# Patient Record
Sex: Female | Born: 1965
Health system: Southern US, Community
[De-identification: ages and names within clinical notes are randomized; demographics above are authoritative.]

## PROBLEM LIST (undated history)

## (undated) DIAGNOSIS — M1990 Unspecified osteoarthritis, unspecified site: Secondary | ICD-10-CM

## (undated) DIAGNOSIS — E7800 Pure hypercholesterolemia, unspecified: Secondary | ICD-10-CM

## (undated) DIAGNOSIS — I213 ST elevation (STEMI) myocardial infarction of unspecified site: Secondary | ICD-10-CM

## (undated) DIAGNOSIS — R51 Headache: Secondary | ICD-10-CM

## (undated) DIAGNOSIS — M25561 Pain in right knee: Secondary | ICD-10-CM

## (undated) DIAGNOSIS — R569 Unspecified convulsions: Secondary | ICD-10-CM

## (undated) DIAGNOSIS — Z9289 Personal history of other medical treatment: Secondary | ICD-10-CM

## (undated) DIAGNOSIS — D219 Benign neoplasm of connective and other soft tissue, unspecified: Secondary | ICD-10-CM

## (undated) DIAGNOSIS — E559 Vitamin D deficiency, unspecified: Secondary | ICD-10-CM

## (undated) DIAGNOSIS — I10 Essential (primary) hypertension: Secondary | ICD-10-CM

## (undated) DIAGNOSIS — E785 Hyperlipidemia, unspecified: Secondary | ICD-10-CM

## (undated) HISTORY — DX: Benign neoplasm of connective and other soft tissue, unspecified: D21.9

## (undated) HISTORY — DX: Pure hypercholesterolemia, unspecified: E78.00

## (undated) HISTORY — DX: Personal history of other medical treatment: Z92.89

## (undated) HISTORY — DX: Vitamin D deficiency, unspecified: E55.9

## (undated) HISTORY — PX: TUBAL LIGATION: SHX77

## (undated) HISTORY — DX: Pain in right knee: M25.561

## (undated) HISTORY — PX: UMBILICAL HERNIA REPAIR: SHX196

## (undated) HISTORY — DX: Unspecified osteoarthritis, unspecified site: M19.90

---

## 2004-05-26 ENCOUNTER — Emergency Department (HOSPITAL_COMMUNITY): Admission: EM | Admit: 2004-05-26 | Discharge: 2004-05-27 | Payer: Self-pay | Admitting: Emergency Medicine

## 2004-11-08 ENCOUNTER — Emergency Department (HOSPITAL_COMMUNITY): Admission: AD | Admit: 2004-11-08 | Discharge: 2004-11-08 | Payer: Self-pay | Admitting: Family Medicine

## 2005-01-09 ENCOUNTER — Emergency Department (HOSPITAL_COMMUNITY): Admission: EM | Admit: 2005-01-09 | Discharge: 2005-01-09 | Payer: Self-pay | Admitting: Family Medicine

## 2005-01-21 ENCOUNTER — Ambulatory Visit: Payer: Self-pay | Admitting: Internal Medicine

## 2005-01-30 ENCOUNTER — Ambulatory Visit: Payer: Self-pay | Admitting: Internal Medicine

## 2005-02-07 ENCOUNTER — Emergency Department (HOSPITAL_COMMUNITY): Admission: EM | Admit: 2005-02-07 | Discharge: 2005-02-07 | Payer: Self-pay | Admitting: Emergency Medicine

## 2005-04-09 ENCOUNTER — Emergency Department (HOSPITAL_COMMUNITY): Admission: EM | Admit: 2005-04-09 | Discharge: 2005-04-09 | Payer: Self-pay | Admitting: Cardiology

## 2005-05-11 ENCOUNTER — Ambulatory Visit: Payer: Self-pay | Admitting: Internal Medicine

## 2005-06-17 ENCOUNTER — Emergency Department (HOSPITAL_COMMUNITY): Admission: EM | Admit: 2005-06-17 | Discharge: 2005-06-17 | Payer: Self-pay | Admitting: Emergency Medicine

## 2005-09-29 ENCOUNTER — Emergency Department (HOSPITAL_COMMUNITY): Admission: EM | Admit: 2005-09-29 | Discharge: 2005-09-29 | Payer: Self-pay | Admitting: Emergency Medicine

## 2005-11-24 ENCOUNTER — Ambulatory Visit: Payer: Self-pay | Admitting: Internal Medicine

## 2006-02-12 ENCOUNTER — Emergency Department (HOSPITAL_COMMUNITY): Admission: EM | Admit: 2006-02-12 | Discharge: 2006-02-12 | Payer: Self-pay | Admitting: Family Medicine

## 2006-04-12 ENCOUNTER — Emergency Department (HOSPITAL_COMMUNITY): Admission: EM | Admit: 2006-04-12 | Discharge: 2006-04-12 | Payer: Self-pay | Admitting: Emergency Medicine

## 2006-10-20 ENCOUNTER — Emergency Department (HOSPITAL_COMMUNITY): Admission: EM | Admit: 2006-10-20 | Discharge: 2006-10-20 | Payer: Self-pay | Admitting: Emergency Medicine

## 2007-03-15 ENCOUNTER — Emergency Department (HOSPITAL_COMMUNITY): Admission: EM | Admit: 2007-03-15 | Discharge: 2007-03-15 | Payer: Self-pay | Admitting: Emergency Medicine

## 2007-08-16 ENCOUNTER — Emergency Department (HOSPITAL_COMMUNITY): Admission: EM | Admit: 2007-08-16 | Discharge: 2007-08-16 | Payer: Self-pay | Admitting: Emergency Medicine

## 2007-12-21 ENCOUNTER — Emergency Department (HOSPITAL_COMMUNITY): Admission: EM | Admit: 2007-12-21 | Discharge: 2007-12-21 | Payer: Self-pay | Admitting: Emergency Medicine

## 2009-09-03 ENCOUNTER — Other Ambulatory Visit: Admission: RE | Admit: 2009-09-03 | Discharge: 2009-09-03 | Payer: Self-pay | Admitting: Family Medicine

## 2009-09-28 HISTORY — PX: SHOULDER SURGERY: SHX246

## 2009-10-14 ENCOUNTER — Ambulatory Visit (HOSPITAL_COMMUNITY): Admission: RE | Admit: 2009-10-14 | Discharge: 2009-10-14 | Payer: Self-pay | Admitting: Chiropractic Medicine

## 2009-12-29 ENCOUNTER — Emergency Department (HOSPITAL_COMMUNITY): Admission: EM | Admit: 2009-12-29 | Discharge: 2009-12-29 | Payer: Self-pay | Admitting: Emergency Medicine

## 2010-11-06 ENCOUNTER — Emergency Department (HOSPITAL_COMMUNITY): Payer: Managed Care, Other (non HMO)

## 2010-11-06 ENCOUNTER — Emergency Department (HOSPITAL_COMMUNITY)
Admission: EM | Admit: 2010-11-06 | Discharge: 2010-11-06 | Disposition: A | Discharge status: Home / Self Care | Payer: Managed Care, Other (non HMO) | Attending: Emergency Medicine | Admitting: Emergency Medicine

## 2010-11-06 DIAGNOSIS — R000 Tachycardia, unspecified: Secondary | ICD-10-CM | POA: Insufficient documentation

## 2010-11-06 DIAGNOSIS — M25519 Pain in unspecified shoulder: Secondary | ICD-10-CM | POA: Insufficient documentation

## 2010-11-06 DIAGNOSIS — I10 Essential (primary) hypertension: Secondary | ICD-10-CM | POA: Insufficient documentation

## 2010-11-06 DIAGNOSIS — R0602 Shortness of breath: Secondary | ICD-10-CM | POA: Insufficient documentation

## 2010-11-06 DIAGNOSIS — E119 Type 2 diabetes mellitus without complications: Secondary | ICD-10-CM | POA: Insufficient documentation

## 2010-11-06 DIAGNOSIS — R079 Chest pain, unspecified: Secondary | ICD-10-CM | POA: Insufficient documentation

## 2010-11-06 LAB — URINALYSIS, ROUTINE W REFLEX MICROSCOPIC
Ketones, ur: NEGATIVE mg/dL
Protein, ur: NEGATIVE mg/dL
Urine Glucose, Fasting: NEGATIVE mg/dL
Urobilinogen, UA: 1 mg/dL (ref 0.0–1.0)

## 2010-11-06 LAB — DIFFERENTIAL
Basophils Absolute: 0 10*3/uL (ref 0.0–0.1)
Basophils Relative: 0 % (ref 0–1)
Eosinophils Relative: 3 % (ref 0–5)
Monocytes Absolute: 0.6 10*3/uL (ref 0.1–1.0)
Neutro Abs: 2.9 10*3/uL (ref 1.7–7.7)

## 2010-11-06 LAB — CK TOTAL AND CKMB (NOT AT ARMC)
CK, MB: 0.9 ng/mL (ref 0.3–4.0)
Relative Index: 0.7 (ref 0.0–2.5)
Total CK: 125 U/L (ref 7–177)

## 2010-11-06 LAB — CBC
HCT: 33.8 % — ABNORMAL LOW (ref 36.0–46.0)
Hemoglobin: 10.6 g/dL — ABNORMAL LOW (ref 12.0–15.0)
MCV: 65.3 fL — ABNORMAL LOW (ref 78.0–100.0)
RDW: 18.6 % — ABNORMAL HIGH (ref 11.5–15.5)
WBC: 6.3 10*3/uL (ref 4.0–10.5)

## 2010-11-06 LAB — BASIC METABOLIC PANEL
BUN: 8 mg/dL (ref 6–23)
CO2: 29 mEq/L (ref 19–32)
Chloride: 98 mEq/L (ref 96–112)
Glucose, Bld: 165 mg/dL — ABNORMAL HIGH (ref 70–99)
Potassium: 3.2 mEq/L — ABNORMAL LOW (ref 3.5–5.1)

## 2010-11-06 LAB — TROPONIN I: Troponin I: 0.01 ng/mL (ref 0.00–0.06)

## 2010-11-06 LAB — D-DIMER, QUANTITATIVE: D-Dimer, Quant: 0.55 ug/mL-FEU — ABNORMAL HIGH (ref 0.00–0.48)

## 2010-11-06 LAB — GLUCOSE, CAPILLARY: Glucose-Capillary: 160 mg/dL — ABNORMAL HIGH (ref 70–99)

## 2010-11-06 MED ORDER — IOHEXOL 300 MG/ML  SOLN
100.0000 mL | Freq: Once | INTRAMUSCULAR | Status: AC | PRN
  Administered 2010-11-06: 100 mL via INTRAVENOUS

## 2010-12-17 ENCOUNTER — Emergency Department (HOSPITAL_COMMUNITY)
Admission: EM | Admit: 2010-12-17 | Discharge: 2010-12-17 | Disposition: A | Discharge status: Home / Self Care | Payer: Managed Care, Other (non HMO) | Attending: Emergency Medicine | Admitting: Emergency Medicine

## 2010-12-17 DIAGNOSIS — E119 Type 2 diabetes mellitus without complications: Secondary | ICD-10-CM | POA: Insufficient documentation

## 2010-12-17 DIAGNOSIS — L089 Local infection of the skin and subcutaneous tissue, unspecified: Secondary | ICD-10-CM | POA: Insufficient documentation

## 2010-12-17 DIAGNOSIS — R21 Rash and other nonspecific skin eruption: Secondary | ICD-10-CM | POA: Insufficient documentation

## 2010-12-17 DIAGNOSIS — I10 Essential (primary) hypertension: Secondary | ICD-10-CM | POA: Insufficient documentation

## 2010-12-17 LAB — GLUCOSE, CAPILLARY: Glucose-Capillary: 246 mg/dL — ABNORMAL HIGH (ref 70–99)

## 2011-01-07 ENCOUNTER — Emergency Department (HOSPITAL_COMMUNITY)
Admission: EM | Admit: 2011-01-07 | Discharge: 2011-01-07 | Disposition: A | Discharge status: Home / Self Care | Payer: Managed Care, Other (non HMO) | Attending: Emergency Medicine | Admitting: Emergency Medicine

## 2011-01-07 DIAGNOSIS — E119 Type 2 diabetes mellitus without complications: Secondary | ICD-10-CM | POA: Insufficient documentation

## 2011-01-07 DIAGNOSIS — X58XXXA Exposure to other specified factors, initial encounter: Secondary | ICD-10-CM | POA: Insufficient documentation

## 2011-01-07 DIAGNOSIS — H5710 Ocular pain, unspecified eye: Secondary | ICD-10-CM | POA: Insufficient documentation

## 2011-01-07 DIAGNOSIS — I10 Essential (primary) hypertension: Secondary | ICD-10-CM | POA: Insufficient documentation

## 2011-01-07 DIAGNOSIS — R51 Headache: Secondary | ICD-10-CM | POA: Insufficient documentation

## 2011-01-07 DIAGNOSIS — S058X9A Other injuries of unspecified eye and orbit, initial encounter: Secondary | ICD-10-CM | POA: Insufficient documentation

## 2011-01-07 DIAGNOSIS — F411 Generalized anxiety disorder: Secondary | ICD-10-CM | POA: Insufficient documentation

## 2011-06-22 LAB — HEPATIC FUNCTION PANEL
ALT: 14
AST: 21
Albumin: 3.6
Alkaline Phosphatase: 42
Total Protein: 8

## 2011-06-22 LAB — BASIC METABOLIC PANEL
BUN: 7
CO2: 25
GFR calc non Af Amer: >60
Glucose, Bld: 179 — ABNORMAL HIGH
Potassium: 3.6

## 2011-06-22 LAB — CBC
MCHC: 32
Platelets: 483 — ABNORMAL HIGH
RBC: 4.84
WBC: 7.5

## 2011-06-22 LAB — DIFFERENTIAL
Eosinophils Relative: 2
Monocytes Absolute: 0.5
Neutrophils Relative %: 52

## 2011-06-22 LAB — POCT CARDIAC MARKERS: CKMB, poc: 1.2

## 2011-10-18 ENCOUNTER — Emergency Department (HOSPITAL_COMMUNITY)
Admission: EM | Admit: 2011-10-18 | Discharge: 2011-10-18 | Disposition: A | Discharge status: Home / Self Care | Payer: Managed Care, Other (non HMO) | Attending: Emergency Medicine | Admitting: Emergency Medicine

## 2011-10-18 ENCOUNTER — Encounter (HOSPITAL_COMMUNITY): Payer: Self-pay | Admitting: Emergency Medicine

## 2011-10-18 ENCOUNTER — Emergency Department (HOSPITAL_COMMUNITY): Payer: Managed Care, Other (non HMO)

## 2011-10-18 DIAGNOSIS — H538 Other visual disturbances: Secondary | ICD-10-CM | POA: Insufficient documentation

## 2011-10-18 DIAGNOSIS — R51 Headache: Secondary | ICD-10-CM | POA: Insufficient documentation

## 2011-10-18 DIAGNOSIS — I10 Essential (primary) hypertension: Secondary | ICD-10-CM | POA: Insufficient documentation

## 2011-10-18 DIAGNOSIS — Z79899 Other long term (current) drug therapy: Secondary | ICD-10-CM | POA: Insufficient documentation

## 2011-10-18 DIAGNOSIS — E119 Type 2 diabetes mellitus without complications: Secondary | ICD-10-CM | POA: Insufficient documentation

## 2011-10-18 DIAGNOSIS — E785 Hyperlipidemia, unspecified: Secondary | ICD-10-CM | POA: Insufficient documentation

## 2011-10-18 HISTORY — DX: Essential (primary) hypertension: I10

## 2011-10-18 HISTORY — DX: Hyperlipidemia, unspecified: E78.5

## 2011-10-18 LAB — BASIC METABOLIC PANEL
CO2: 27 mEq/L (ref 19–32)
Chloride: 98 mEq/L (ref 96–112)
GFR calc non Af Amer: >90 mL/min (ref >90)
Glucose, Bld: 149 mg/dL — ABNORMAL HIGH (ref 70–99)
Potassium: 3.4 mEq/L — ABNORMAL LOW (ref 3.5–5.1)
Sodium: 136 mEq/L (ref 135–145)

## 2011-10-18 LAB — CBC
HCT: 31.3 % — ABNORMAL LOW (ref 36.0–46.0)
Hemoglobin: 9.8 g/dL — ABNORMAL LOW (ref 12.0–15.0)
MCH: 20.2 pg — ABNORMAL LOW (ref 26.0–34.0)
RBC: 4.85 MIL/uL (ref 3.87–5.11)

## 2011-10-18 MED ORDER — GLIPIZIDE 10 MG PO TABS: 10.0000 mg | ORAL_TABLET | Freq: Every day | ORAL | Status: DC

## 2011-10-18 MED ORDER — METFORMIN HCL 1000 MG PO TABS: 1000.0000 mg | ORAL_TABLET | Freq: Two times a day (BID) | ORAL | Status: DC

## 2011-10-18 NOTE — ED Notes (Signed)
States that she has been having blurred vision and headaches for the past month. States that she saw the eye doctor and was asked to follow up due to something that was seen in her eye exam. States that she is out of her Metformin and Glipizide and last took it yesterday.

## 2011-10-18 NOTE — ED Provider Notes (Addendum)
History     CSN: [id number]  Arrival date & time 10/18/11  1337   First MD Initiated Contact with Patient 10/18/11 [phone]      Chief Complaint  Patient presents with  . Hyperglycemia  . Headache  . Blurred Vision    (Consider location/radiation/quality/duration/timing/severity/associated sxs/prior treatment) Patient is a 46 y.o. female presenting with headaches. The history is provided by the patient.  Headache  This is a recurrent problem. The current episode started more than 1 week ago. The problem has not changed since onset.The headache is associated with nothing. Pain location: Top. The pain is at a severity of 5/10. The pain is moderate. The pain does not radiate. Pertinent negatives include no fever, no chest pressure, no near-syncope, no syncope, no shortness of breath, no nausea and no vomiting. She has tried nothing for the symptoms.   Any symptoms for several weeks associated with visual symptoms and been going on for more than a month. Patient has a history of diabetes the visual complaints have been evaluated by her eye doctor and she has a specialty referral to [facility] for further evaluation. She is concerned about her blood sugar being high was measured to 10 at church when the symptoms started. History of diabetes. Past Medical History  Diagnosis Date  . Diabetes mellitus   . Hypertension   . Hyperlipidemia     Past Surgical History  Procedure Date  . Tubal ligation   . Shoulder surgery     No family history on file.  History  Substance Use Topics  . Smoking status: Never Smoker   . Smokeless tobacco: Not on file  . Alcohol Use: No    OB History    Grav Para Term Preterm Abortions TAB SAB Ect Mult Living                  Review of Systems  Constitutional: Negative for fever.  HENT: Negative for congestion and neck pain.   Eyes: Positive for visual disturbance. Negative for photophobia.  Respiratory: Negative for cough and shortness of  breath.   Cardiovascular: Negative for chest pain, syncope and near-syncope.  Gastrointestinal: Negative for nausea, vomiting and abdominal pain.  Genitourinary: Negative for dysuria.  Musculoskeletal: Negative for back pain.  Skin: Negative for rash.  Neurological: Positive for weakness and headaches.  Hematological: Does not bruise/bleed easily.    Allergies  Review of patient's allergies indicates no known allergies.  Home Medications   Current Outpatient Rx  Name Route Sig Dispense Refill  . AMLODIPINE-VALSARTAN-HCTZ 10-320-25 MG PO TABS Oral Take 1 tablet by mouth daily.    Marland Kitchen VITAMIN D 1000 UNITS PO TABS Oral Take 1,000 Units by mouth daily.    Marland Kitchen GLIPIZIDE 10 MG PO TABS Oral Take 10 mg by mouth 2 (two) times daily before a meal.    . METFORMIN HCL 1000 MG PO TABS Oral Take 1,000 mg by mouth 2 (two) times daily with a meal.    . METOPROLOL TARTRATE 50 MG PO TABS Oral Take 50 mg by mouth 2 (two) times daily.    Marland Kitchen SIMVASTATIN 20 MG PO TABS Oral Take 20 mg by mouth every evening.      BP 123/80  Pulse 96  Temp(Src) 97.8 F (36.6 C) (Oral)  Resp 20  SpO2 94%  LMP 10/12/2011  Physical Exam  Nursing note and vitals reviewed. Constitutional: She is oriented to person, place, and time. She appears well-developed and well-nourished. No distress.  HENT:  Head: Normocephalic and atraumatic.  Mouth/Throat: Oropharynx is clear and moist.  Eyes: Conjunctivae and EOM are normal. Pupils are equal, round, and reactive to light.  Neck: Normal range of motion. Neck supple.  Cardiovascular: Normal rate, regular rhythm, normal heart sounds and intact distal pulses.   No murmur heard. Pulmonary/Chest: Effort normal and breath sounds normal. No respiratory distress.  Abdominal: Soft. Bowel sounds are normal. There is no tenderness.  Musculoskeletal: Normal range of motion.  Neurological: She is alert and oriented to person, place, and time. No cranial nerve deficit.  Skin: Skin is warm.  No rash noted.    ED Course  Procedures (including critical care time)  Labs Reviewed  GLUCOSE, CAPILLARY - Abnormal; Notable for the following:    Glucose-Capillary 191 (*)    All other components within normal limits  BASIC METABOLIC PANEL - Abnormal; Notable for the following:    Potassium 3.4 (*)    Glucose, Bld 149 (*)    All other components within normal limits  CBC - Abnormal; Notable for the following:    Hemoglobin 9.8 (*)    HCT 31.3 (*)    MCV 64.5 (*)    MCH 20.2 (*)    RDW 19.2 (*)    Platelets 427 (*)    All other components within normal limits   Ct Head Wo Contrast  10/18/2011  *RADIOLOGY REPORT*  Clinical Data: Intermittent headaches for 3 weeks.  Dizziness.  CT HEAD WITHOUT CONTRAST  Technique:  Contiguous axial images were obtained from the base of the skull through the vertex without contrast.  Comparison: None.  Findings: The brain appears normal without evidence of acute infarction, hemorrhage, mass lesion, mass effect, midline shift or abnormal extra-axial fluid collection.  No hydrocephalus or pneumocephalus.  Calvarium intact.  Imaged paranasal sinuses and mastoid air cells are clear.  IMPRESSION: Negative head CT.  Original Report Authenticated By: Bernadene Bell. Maricela Curet, M.D.   Results for orders placed during the hospital encounter of 10/18/11  GLUCOSE, CAPILLARY      Component Value Range   Glucose-Capillary 191 (*) 70 - 99 (mg/dL)  BASIC METABOLIC PANEL      Component Value Range   Sodium 136  135 - 145 (mEq/L)   Potassium 3.4 (*) 3.5 - 5.1 (mEq/L)   Chloride 98  96 - 112 (mEq/L)   CO2 27  19 - 32 (mEq/L)   Glucose, Bld 149 (*) 70 - 99 (mg/dL)   BUN 9  6 - 23 (mg/dL)   Creatinine, Ser 1.61  0.50 - 1.10 (mg/dL)   Calcium 09.6  8.4 - 10.5 (mg/dL)   GFR calc non Af Amer >90  >90 (mL/min)   GFR calc Af Amer >90  >90 (mL/min)  CBC      Component Value Range   WBC 7.0  4.0 - 10.5 (K/uL)   RBC 4.85  3.87 - 5.11 (MIL/uL)   Hemoglobin 9.8 (*) 12.0 -  15.0 (g/dL)   HCT 04.5 (*) 40.9 - 46.0 (%)   MCV 64.5 (*) 78.0 - 100.0 (fL)   MCH 20.2 (*) 26.0 - 34.0 (pg)   MCHC 31.3  30.0 - 36.0 (g/dL)   RDW 81.1 (*) 91.4 - 15.5 (%)   Platelets 427 (*) 150 - 400 (K/uL)     1. Headache       MDM  With history of headaches now for approximately one month or longer. She will history of some visual changes has been evaluated by her eye doctor  and she has a referral to [facility] to see eye specialist there. [facility] patient felt dizzy and seeing some spots these are all symptoms she's had before and her head was feeling funny. Her blood sugar there was elevated and checked it and it was like to 10 upon arrival to the emergency department blood sugar was 191 artificial blood checked here it was 149. It has a known history of diabetes. Followed by Dr. Haynes Dage.  Without any specific or serious findings okay to discharge okay to followup with her primary care doctor in the next few days.  Also of note patient is out of her diabetic medicines so her glipizide and metformin have been renewed.       Shelda Jakes, MD 10/18/11 1825  Shelda Jakes, MD 10/18/11 1827  Shelda Jakes, MD 10/21/11 [phone]

## 2012-06-01 ENCOUNTER — Encounter (HOSPITAL_COMMUNITY): Payer: Self-pay | Admitting: Emergency Medicine

## 2012-06-01 ENCOUNTER — Observation Stay (HOSPITAL_COMMUNITY)
Admission: EM | Admit: 2012-06-01 | Discharge: 2012-06-02 | Disposition: A | Discharge status: Home / Self Care | Payer: Medicaid Other | Attending: Family Medicine | Admitting: Family Medicine

## 2012-06-01 ENCOUNTER — Emergency Department (HOSPITAL_COMMUNITY): Payer: Medicaid Other

## 2012-06-01 DIAGNOSIS — E785 Hyperlipidemia, unspecified: Secondary | ICD-10-CM | POA: Diagnosis present

## 2012-06-01 DIAGNOSIS — E66812 Obesity, class 2: Secondary | ICD-10-CM | POA: Diagnosis present

## 2012-06-01 DIAGNOSIS — E876 Hypokalemia: Secondary | ICD-10-CM | POA: Diagnosis present

## 2012-06-01 DIAGNOSIS — I10 Essential (primary) hypertension: Principal | ICD-10-CM | POA: Insufficient documentation

## 2012-06-01 DIAGNOSIS — R0989 Other specified symptoms and signs involving the circulatory and respiratory systems: Secondary | ICD-10-CM | POA: Insufficient documentation

## 2012-06-01 DIAGNOSIS — R0609 Other forms of dyspnea: Secondary | ICD-10-CM | POA: Insufficient documentation

## 2012-06-01 DIAGNOSIS — E669 Obesity, unspecified: Secondary | ICD-10-CM | POA: Insufficient documentation

## 2012-06-01 DIAGNOSIS — E119 Type 2 diabetes mellitus without complications: Secondary | ICD-10-CM | POA: Insufficient documentation

## 2012-06-01 DIAGNOSIS — E1169 Type 2 diabetes mellitus with other specified complication: Secondary | ICD-10-CM | POA: Diagnosis present

## 2012-06-01 DIAGNOSIS — D649 Anemia, unspecified: Secondary | ICD-10-CM | POA: Insufficient documentation

## 2012-06-01 DIAGNOSIS — I161 Hypertensive emergency: Secondary | ICD-10-CM

## 2012-06-01 LAB — CBC
Hemoglobin: 8.7 g/dL — ABNORMAL LOW (ref 12.0–15.0)
MCH: 17.6 pg — ABNORMAL LOW (ref 26.0–34.0)
RBC: 4.94 MIL/uL (ref 3.87–5.11)

## 2012-06-01 LAB — BASIC METABOLIC PANEL
CO2: 27 mEq/L (ref 19–32)
Glucose, Bld: 170 mg/dL — ABNORMAL HIGH (ref 70–99)
Potassium: 3.7 mEq/L (ref 3.5–5.1)
Sodium: 134 mEq/L — ABNORMAL LOW (ref 135–145)

## 2012-06-01 LAB — GLUCOSE, CAPILLARY: Glucose-Capillary: 172 mg/dL — ABNORMAL HIGH (ref 70–99)

## 2012-06-01 LAB — RETICULOCYTES: Retic Count, Absolute: 99.8 10*3/uL (ref 19.0–186.0)

## 2012-06-01 LAB — TROPONIN I: Troponin I: <0.3 ng/mL (ref <0.30)

## 2012-06-01 MED ORDER — INSULIN ASPART 100 UNIT/ML ~~LOC~~ SOLN: 0.0000 [IU] | Freq: Every day | SUBCUTANEOUS | Status: DC

## 2012-06-01 MED ORDER — AMLODIPINE BESYLATE 10 MG PO TABS
10.0000 mg | ORAL_TABLET | Freq: Once | ORAL | Status: AC
  Administered 2012-06-01: 10 mg via ORAL
  Filled 2012-06-01: qty 1

## 2012-06-01 MED ORDER — ONDANSETRON HCL 4 MG PO TABS: 4.0000 mg | ORAL_TABLET | Freq: Four times a day (QID) | ORAL | Status: DC | PRN

## 2012-06-01 MED ORDER — MORPHINE SULFATE 4 MG/ML IJ SOLN: 4.0000 mg | INTRAMUSCULAR | Status: DC | PRN

## 2012-06-01 MED ORDER — LABETALOL HCL 5 MG/ML IV SOLN
10.0000 mg | INTRAVENOUS | Status: DC | PRN
  Administered 2012-06-02: 10 mg via INTRAVENOUS
  Filled 2012-06-01 (×2): qty 4

## 2012-06-01 MED ORDER — ENOXAPARIN SODIUM 80 MG/0.8ML ~~LOC~~ SOLN
70.0000 mg | SUBCUTANEOUS | Status: DC
  Administered 2012-06-02: 70 mg via SUBCUTANEOUS
  Filled 2012-06-01 (×2): qty 0.8

## 2012-06-01 MED ORDER — ONDANSETRON HCL 4 MG/2ML IJ SOLN: 4.0000 mg | Freq: Four times a day (QID) | INTRAMUSCULAR | Status: DC | PRN

## 2012-06-01 MED ORDER — METFORMIN HCL 500 MG PO TABS
500.0000 mg | ORAL_TABLET | Freq: Two times a day (BID) | ORAL | Status: DC
  Administered 2012-06-02: 500 mg via ORAL
  Filled 2012-06-01 (×4): qty 1

## 2012-06-01 MED ORDER — IRBESARTAN 300 MG PO TABS
300.0000 mg | ORAL_TABLET | Freq: Every day | ORAL | Status: DC
  Administered 2012-06-01 – 2012-06-02 (×2): 300 mg via ORAL
  Filled 2012-06-01 (×2): qty 1

## 2012-06-01 MED ORDER — ENOXAPARIN SODIUM 40 MG/0.4ML ~~LOC~~ SOLN: 40.0000 mg | SUBCUTANEOUS | Status: DC

## 2012-06-01 MED ORDER — HYDRALAZINE HCL 20 MG/ML IJ SOLN
10.0000 mg | Freq: Once | INTRAMUSCULAR | Status: AC
  Administered 2012-06-01: 10 mg via INTRAVENOUS
  Filled 2012-06-01: qty 0.5

## 2012-06-01 MED ORDER — DOCUSATE SODIUM 100 MG PO CAPS
100.0000 mg | ORAL_CAPSULE | Freq: Two times a day (BID) | ORAL | Status: DC
  Administered 2012-06-02 (×2): 100 mg via ORAL
  Filled 2012-06-01 (×3): qty 1

## 2012-06-01 MED ORDER — SODIUM CHLORIDE 0.9 % IJ SOLN: 3.0000 mL | INTRAMUSCULAR | Status: DC | PRN

## 2012-06-01 MED ORDER — HYDROCHLOROTHIAZIDE 12.5 MG PO CAPS
25.0000 mg | ORAL_CAPSULE | Freq: Once | ORAL | Status: AC
  Administered 2012-06-01: 25 mg via ORAL
  Filled 2012-06-01: qty 2

## 2012-06-01 MED ORDER — ASPIRIN EC 81 MG PO TBEC
81.0000 mg | DELAYED_RELEASE_TABLET | Freq: Every day | ORAL | Status: DC
  Administered 2012-06-02 (×2): 81 mg via ORAL
  Filled 2012-06-01 (×2): qty 1

## 2012-06-01 MED ORDER — LABETALOL HCL 5 MG/ML IV SOLN: 10.0000 mg | INTRAVENOUS | Status: DC | PRN

## 2012-06-01 MED ORDER — INSULIN ASPART 100 UNIT/ML ~~LOC~~ SOLN
0.0000 [IU] | Freq: Three times a day (TID) | SUBCUTANEOUS | Status: DC
Start: 2012-06-02 — End: 2012-06-02
  Administered 2012-06-02: 7 [IU] via SUBCUTANEOUS
  Administered 2012-06-02: 4 [IU] via SUBCUTANEOUS

## 2012-06-01 MED ORDER — SODIUM CHLORIDE 0.9 % IV SOLN: 250.0000 mL | INTRAVENOUS | Status: DC | PRN

## 2012-06-01 MED ORDER — SODIUM CHLORIDE 0.9 % IJ SOLN
3.0000 mL | Freq: Two times a day (BID) | INTRAMUSCULAR | Status: DC
  Administered 2012-06-02 (×2): 3 mL via INTRAVENOUS

## 2012-06-01 MED ORDER — METOPROLOL TARTRATE 25 MG PO TABS
50.0000 mg | ORAL_TABLET | Freq: Once | ORAL | Status: AC
  Administered 2012-06-01: 50 mg via ORAL
  Filled 2012-06-01: qty 2

## 2012-06-01 NOTE — H&P (Signed)
[facility] History and Physical  Erlene Devita ZOX:[id number] DOB: 02/24/1966    PCP:   None currently.  Chief Complaint: Increased DOE.  HPI: Claudia [name] is an 46 y.o. female with hx of HTN since age 73, DM2 on oral hyperglyemic agents, Hyperlipidemia, due to cost contrainst, hadn't seen any physician nor taking any medications for over a year, noted increase DOE and presents to the ER.  She denied any chest pain, N/V, HA, Vx changes, fever or chills.  In the ER, she was found to have SBP 230 and DBP of 110.  She has a CXR with no CHF, but with CM.  Her troponin was negative.  Her BS was 170 and her Cr was normal.  She was found to be anemic with Hb of 8.7g/DL, but this is not new.  She denied black or bloody stool.  She was given her home meds back, and hospitalist was asked to admit her for HTN urgency.  Rewiew of Systems:  Constitutional: Negative for malaise, fever and chills. No significant weight loss or weight gain Eyes: Negative for eye pain, redness and discharge, diplopia, visual changes, or flashes of light. ENMT: Negative for ear pain, hoarseness, nasal congestion, sinus pressure and sore throat. No headaches; tinnitus, drooling, or problem swallowing. Cardiovascular: Negative for chest pain, palpitations, diaphoresis,  and peripheral edema. ; No orthopnea, PND Respiratory: Negative for cough, hemoptysis, wheezing and stridor. No pleuritic chestpain. Gastrointestinal: Negative for nausea, vomiting, diarrhea, constipation, abdominal pain, melena, blood in stool, hematemesis, jaundice and rectal bleeding.    Genitourinary: Negative for frequency, dysuria, incontinence,flank pain and hematuria; Musculoskeletal: Negative for back pain and neck pain. Negative for swelling and trauma.;  Skin: . Negative for pruritus, rash, abrasions, bruising and skin lesion.; ulcerations Neuro: Negative for headache, lightheadedness and neck stiffness. Negative for weakness, altered level of  consciousness , altered mental status, extremity weakness, burning feet, involuntary movement, seizure and syncope.  Psych: negative for anxiety, depression, insomnia, tearfulness, panic attacks, hallucinations, paranoia, suicidal or homicidal ideation    Past Medical History  Diagnosis Date  . Diabetes mellitus   . Hypertension   . Hyperlipidemia     Past Surgical History  Procedure Date  . Tubal ligation   . Shoulder surgery     Medications:  HOME MEDS: Prior to Admission medications   Not on File     Allergies:  Allergies  Allergen Reactions  . Pravachol (Pravastatin Sodium) Itching    Joint pain  . Vicodin (Hydrocodone-Acetaminophen) Itching    Social History:   reports that she has never smoked. She does not have any smokeless tobacco history on file. She reports that she does not drink alcohol or use illicit drugs.  Family History: No family history on file.   Physical Exam: Filed Vitals:   06/01/12 1718 06/01/12 1730 06/01/12 1800 06/01/12 1930  BP: 197/111 172/110 168/96 184/115  Pulse: 101 93 84 87  Temp:      TempSrc:      Resp: 17     SpO2: 97% 97% 98% 99%   Blood pressure 184/115, pulse 87, temperature 98 F (36.7 C), temperature source Oral, resp. rate 17, last menstrual period 05/09/2012, SpO2 99.00%.  GEN:  Pleasant patient lying in the stretcher in no acute distress; cooperative with exam. PSYCH:  alert and oriented x4; does not appear anxious or depressed; affect is appropriate. HEENT: Mucous membranes pink and anicteric; PERRLA; EOM intact; no cervical lymphadenopathy nor thyromegaly or carotid bruit; no JVD; There were  no stridor. Neck is very supple. Breasts:: Not examined CHEST WALL: No tenderness CHEST: Normal respiration, clear to auscultation bilaterally.  HEART: Regular rate and rhythm.  There are no murmur, rub, or gallops.   BACK: No kyphosis or scoliosis; no CVA tenderness ABDOMEN: soft and non-tender; no masses, no  organomegaly, normal abdominal bowel sounds; no pannus; no intertriginous candida. There is no rebound and no distention. Rectal Exam: Not done EXTREMITIES: No bone or joint deformity; age-appropriate arthropathy of the hands and knees; no edema; no ulcerations.  There is no calf tenderness. Genitalia: not examined PULSES: 2+ and symmetric SKIN: Normal hydration no rash or ulceration CNS: Cranial nerves 2-12 grossly intact no focal lateralizing neurologic deficit.  Speech is fluent; uvula elevated with phonation, facial symmetry and tongue midline. DTR are normal bilaterally, cerebella exam is intact, barbinski is negative and strengths are equaled bilaterally.  No sensory loss.   Labs on Admission:  Basic Metabolic Panel:  Lab 06/01/12 1610  NA 134*  K 3.7  CL 99  CO2 27  GLUCOSE 170*  BUN 9  CREATININE 0.58  CALCIUM 9.0  MG --  PHOS --   Liver Function Tests: No results found for this basename: AST:5,ALT:5,ALKPHOS:5,BILITOT:5,PROT:5,ALBUMIN:5 in the last 168 hours No results found for this basename: LIPASE:5,AMYLASE:5 in the last 168 hours No results found for this basename: AMMONIA:5 in the last 168 hours CBC:  Lab 06/01/12 1648  WBC 7.0  NEUTROABS --  HGB 8.7*  HCT 29.2*  MCV 59.1*  PLT 517*   Cardiac Enzymes:  Lab 06/01/12 1648  CKTOTAL --  CKMB --  CKMBINDEX --  TROPONINI <0.30    CBG:  Lab 06/01/12 1556  GLUCAP 172*     Radiological Exams on Admission: Dg Chest 2 View  06/01/2012  *RADIOLOGY REPORT*  Clinical Data: Hypertension, shortness of breath  CHEST - 2 VIEW  Comparison: 11/06/2010  Findings: Study is markedly limited by patient's large body habitus.  Right lower lung is obscured by overlying breast tissue. No definite segmental infiltrate or pulmonary edema.  Cardiomegaly is noted.  Mild degenerative changes mid thoracic spine.  IMPRESSION: Cardiomegaly.  Markedly limited study by patient's large body habitus.  Right mid and lower lung is obscured  by overlying breast tissue.  No definite focal infiltrate or pulmonary edema.   Original Report Authenticated By: Natasha Mead, M.D.     EKG: Independently reviewed.  SR no acute or ischemic changes.   Assessment/Plan Present on Admission:  .HTN (hypertension) .DM (diabetes mellitus) .Hyperlipidemia .DOE (dyspnea on exertion) .Obesity Anemia.  PLAN:  Will admit her to telemetry.  I will restart her home meds.  Will use IV Labetelol for PRN hypertensive episodes.  For her DM2, I will continue her metformin, but hold off on Glipizide for now.  Use ISS resistent for hyperglycemia.  She never had secondary causes of HTN work up, but she probably has essential HTN.  WIll defer to outpatient to address that.  She has anemia, and will begin with iron studies and stool OB studies.  She is stable, full code, and will be admitted to [facility].  Will cycle her troponin and obtain a cardiac ECHO.  Other plans as per orders.  Code Status: Golda Acre, MD. [facility] Pager [phone] 7pm to 7am.  06/01/2012, 8:14 PM

## 2012-06-01 NOTE — ED Provider Notes (Signed)
History     CSN: [id number]  Arrival date & time 06/01/12  1427   First MD Initiated Contact with Patient 06/01/12 1615      Chief Complaint  Patient presents with  . Hypertension     The history is provided by the patient.   the patient reports increasing dyspnea on exertion and orthopnea over the past several days.  She reports she's been out of her blood pressure medication for several months secondary to loss of insurance and is in between primary physicians.  She denies unilateral leg swelling.  She denies chest pain.  She reports no shortness of breath at rest.  She reports she cannot walk short distances before she becomes extremely winded and has to stop.  No known history of coronary artery disease.  She has a history of diabetes or hyperlipidemia.  No prior history of DVT or pulmonary embolism.  The patient denies cough or fever or chills.  Her symptoms are mild to moderate in severity  Past Medical History  Diagnosis Date  . Diabetes mellitus   . Hypertension   . Hyperlipidemia     Past Surgical History  Procedure Date  . Tubal ligation   . Shoulder surgery     No family history on file.  History  Substance Use Topics  . Smoking status: Never Smoker   . Smokeless tobacco: Not on file  . Alcohol Use: No    OB History    Grav Para Term Preterm Abortions TAB SAB Ect Mult Living                  Review of Systems  All other systems reviewed and are negative.    Allergies  Pravachol and Vicodin  Home Medications  No current outpatient prescriptions on file.  BP 184/115  Pulse 87  Temp 98 F (36.7 C) (Oral)  Resp 17  SpO2 99%  LMP 05/09/2012  Physical Exam  Nursing note and vitals reviewed. Constitutional: She is oriented to person, place, and time. She appears well-developed and well-nourished. No distress.       Morbid obesity  HENT:  Head: Normocephalic and atraumatic.  Eyes: EOM are normal.  Neck: Normal range of motion.  Cardiovascular:  Normal rate, regular rhythm and normal heart sounds.   Pulmonary/Chest: Effort normal and breath sounds normal.  Abdominal: Soft. She exhibits no distension. There is no tenderness.  Musculoskeletal: Normal range of motion.  Neurological: She is alert and oriented to person, place, and time.  Skin: Skin is warm and dry.  Psychiatric: She has a normal mood and affect. Judgment normal.    ED Course  Procedures (including critical care time)  Labs Reviewed  GLUCOSE, CAPILLARY - Abnormal; Notable for the following:    Glucose-Capillary 172 (*)     All other components within normal limits  CBC - Abnormal; Notable for the following:    Hemoglobin 8.7 (*)     HCT 29.2 (*)     MCV 59.1 (*)     MCH 17.6 (*)     MCHC 29.8 (*)     RDW 19.4 (*)     Platelets 517 (*)     All other components within normal limits  BASIC METABOLIC PANEL - Abnormal; Notable for the following:    Sodium 134 (*)     Glucose, Bld 170 (*)     All other components within normal limits  PRO B NATRIURETIC PEPTIDE - Abnormal; Notable for the following:  Pro B Natriuretic peptide (BNP) 420.2 (*)     All other components within normal limits  TROPONIN I   Dg Chest 2 View  06/01/2012  *RADIOLOGY REPORT*  Clinical Data: Hypertension, shortness of breath  CHEST - 2 VIEW  Comparison: 11/06/2010  Findings: Study is markedly limited by patient's large body habitus.  Right lower lung is obscured by overlying breast tissue. No definite segmental infiltrate or pulmonary edema.  Cardiomegaly is noted.  Mild degenerative changes mid thoracic spine.  IMPRESSION: Cardiomegaly.  Markedly limited study by patient's large body habitus.  Right mid and lower lung is obscured by overlying breast tissue.  No definite focal infiltrate or pulmonary edema.   Original Report Authenticated By: Natasha Mead, M.D.     I personally reviewed the imaging tests through PACS system  I reviewed available ER/hospitalization records thought the  EMR   1. Hypertensive emergency        Date: 06/01/2012  Rate: 87  Rhythm: normal sinus rhythm  QRS Axis: normal  Intervals: normal  ST/T Wave abnormalities: normal  Conduction Disutrbances: none  Narrative Interpretation:   Old EKG Reviewed: No significant changes noted      MDM  The patient has evidence of hypertension and orthopnea with dyspnea on exertion.  Home blood pressure medications given without significant improvement in her blood pressure.  IV hydralazine now.  The patient will need to be admitted for control of her blood pressure.  Concerned about possible congestive heart failure the patient may benefit from echocardiogram.  Pt will be admitted to hospitalist service        Lyanne Co, MD 06/01/12 [phone]

## 2012-06-01 NOTE — ED Notes (Signed)
Pt c/o of blurred vision, "feeling of having a cap on head", headache, SOB, and dizziness when ambulating.

## 2012-06-01 NOTE — ED Notes (Signed)
Attempted to access pt's IV x2. Pt difficult stick, unable to access.  IV RN notified.

## 2012-06-02 DIAGNOSIS — E876 Hypokalemia: Secondary | ICD-10-CM | POA: Diagnosis present

## 2012-06-02 DIAGNOSIS — E119 Type 2 diabetes mellitus without complications: Secondary | ICD-10-CM

## 2012-06-02 LAB — IRON AND TIBC
Iron: 19 ug/dL — ABNORMAL LOW (ref 42–135)
TIBC: 482 ug/dL — ABNORMAL HIGH (ref 250–470)

## 2012-06-02 LAB — CBC
MCV: 58.6 fL — ABNORMAL LOW (ref 78.0–100.0)
Platelets: 475 10*3/uL — ABNORMAL HIGH (ref 150–400)
RBC: 4.93 MIL/uL (ref 3.87–5.11)
WBC: 8.3 10*3/uL (ref 4.0–10.5)

## 2012-06-02 LAB — GLUCOSE, CAPILLARY

## 2012-06-02 LAB — TROPONIN I
Troponin I: <0.3 ng/mL (ref <0.30)
Troponin I: <0.3 ng/mL (ref <0.30)

## 2012-06-02 LAB — BASIC METABOLIC PANEL
CO2: 26 mEq/L (ref 19–32)
Calcium: 9.3 mg/dL (ref 8.4–10.5)
Chloride: 97 mEq/L (ref 96–112)
Sodium: 133 mEq/L — ABNORMAL LOW (ref 135–145)

## 2012-06-02 LAB — FOLATE: Folate: 14.6 ng/mL

## 2012-06-02 MED ORDER — IRBESARTAN 300 MG PO TABS: 300.0000 mg | ORAL_TABLET | Freq: Every day | ORAL | Status: DC

## 2012-06-02 MED ORDER — AMLODIPINE BESYLATE 10 MG PO TABS: 10.0000 mg | ORAL_TABLET | Freq: Every day | ORAL | Status: DC

## 2012-06-02 MED ORDER — FERROUS SULFATE 325 (65 FE) MG PO TABS: 325.0000 mg | ORAL_TABLET | Freq: Three times a day (TID) | ORAL | Status: DC

## 2012-06-02 MED ORDER — METFORMIN HCL 500 MG PO TABS: 500.0000 mg | ORAL_TABLET | Freq: Two times a day (BID) | ORAL | Status: DC

## 2012-06-02 MED ORDER — POTASSIUM CHLORIDE CRYS ER 20 MEQ PO TBCR
30.0000 meq | EXTENDED_RELEASE_TABLET | Freq: Once | ORAL | Status: DC
  Filled 2012-06-02: qty 1

## 2012-06-02 MED ORDER — ASPIRIN 81 MG PO TBEC: 81.0000 mg | DELAYED_RELEASE_TABLET | Freq: Every day | ORAL | Status: DC

## 2012-06-02 MED ORDER — METOPROLOL TARTRATE 50 MG PO TABS
50.0000 mg | ORAL_TABLET | Freq: Two times a day (BID) | ORAL | Status: DC
  Administered 2012-06-02: 50 mg via ORAL
  Filled 2012-06-02 (×2): qty 1

## 2012-06-02 MED ORDER — METOPROLOL TARTRATE 50 MG PO TABS: 50.0000 mg | ORAL_TABLET | Freq: Two times a day (BID) | ORAL | Status: AC

## 2012-06-02 NOTE — Care Management Note (Signed)
    Page 1 of 1   06/02/2012     2:59:28 PM   CARE MANAGEMENT NOTE 06/02/2012  Patient:  Low,Cicley   Account Number:  [id number]  Date Initiated:  06/02/2012  Documentation initiated by:  Lanier Clam  Subjective/Objective Assessment:   ADMITTED W/HTN     Action/Plan:   FROM HOME   Anticipated DC Date:  06/02/2012   Anticipated DC Plan:  HOME/SELF CARE      DC Planning Services  CM consult  Medication Assistance      Choice offered to / List presented to:             Status of service:  Completed, signed off Medicare Important Message given?   (If response is "NO", the following Medicare IM given date fields will be blank) Date Medicare IM given:   Date Additional Medicare IM given:    Discharge Disposition:  HOME/SELF CARE  Per UR Regulation:  Reviewed for med. necessity/level of care/duration of stay  If discussed at Long Length of Stay Meetings, dates discussed:    Comments:  06/02/12 Charitie Hinote RN,BSN NCM [phone] MADE COPY OF PATIENT'S MEDICAID CARD,& TUBED TO ADMITTING W/CONFIRMATION OF RECEIPT,COPY ON CHART ALSO.$[email or web address] [email or web address].

## 2012-06-02 NOTE — Discharge Summary (Signed)
Physician Discharge Summary  Claudia [name] ZOX:[id number] DOB: 08/02/1966 DOA: 06/01/2012  PCP: Dr. Azucena Cecil  Admit date: 06/01/2012 Discharge date: 06/02/2012  Recommendations for Outpatient Follow-up:  1. Please follow up on h/h.   2. F/u on Blood pressure and adjust medication as needed 3. Follow up with blood sugars and adjust medication 4. Will need work-up for secondary causes of hypertension  Discharge Diagnoses:  Principal Problem:  *HTN (hypertension) Active Problems:  DM (diabetes mellitus)  Hyperlipidemia  DOE (dyspnea on exertion)  Obesity   Discharge Condition: Stable  Diet recommendation: Low sodium diet  Filed Weights   06/01/12 2116  Weight: 139.1 kg (306 lb 10.6 oz)    History of present illness:  From original HPI: Claudia [name] is an 46 y.o. female with hx of HTN since age 29, DM2 on oral hyperglyemic agents, Hyperlipidemia, due to cost contrainst, hadn't seen any physician nor taking any medications for over a year, noted increase DOE and presents to the ER. She denied any chest pain, N/V, HA, Vx changes, fever or chills. In the ER, she was found to have SBP 230 and DBP of 110. She has a CXR with no CHF, but with CM. Her troponin was negative. Her BS was 170 and her Cr was normal. She was found to be anemic with Hb of 8.7g/DL, but this is not new. She denied black or bloody stool. She was given her home meds back, and hospitalist was asked to admit her for HTN urgency.  Hospital Course:  HTN Urgency:   -Resolved on Amlodipine, metoprolol, Irbesartan and will plan on continuing this regimen once she transitions home. -Currently feeling improved and denies any SOB, CP, abdominal pain, Blurred vision. - Cardiac enzymes were cycled and were negative. - I have recommended that patient take her blood pressure daily and discuss with her primary care physician for further adjustment of her current medications. - Will need further workup for secondary causes of  hypertension.  Diabetes - Continue diabetic diet - Continue metformin.  Patient does not want to continue her glipizide at this time.  She is to log her blood sugars and take them to her pcp for further modification of her oral hypoglycemic medication.  Anemia - Based on labs looks like iron deficiency anemia. - Discharge on iron supplementation.  Patient is to take ferrous sulfate three times daily with meals. - Will recommend that she get this rechecked as outpatient.  Hypokalemia - Replace orally x 1 time prior to discharge.  Procedures:  ECG: Normal sinus rhythm with no ST elevation or depressions  Consultations:  none  Discharge Exam: Filed Vitals:   06/02/12 0957  BP: 158/99  Pulse: 98  Temp: 98.2 F (36.8 C)  Resp: 18   Filed Vitals:   06/02/12 0550 06/02/12 0716 06/02/12 0904 06/02/12 0957  BP: 165/111 162/110 163/101 158/99  Pulse: 93  96 98  Temp: 98.9 F (37.2 C)   98.2 F (36.8 C)  TempSrc: Oral   Oral  Resp: 16   18  Height:      Weight:      SpO2: 98%   98%    General: Pt in NAD, A and O x 3 Cardiovascular: RRR, No MRG Respiratory: CTA BL, no wheezes  Discharge Instructions  Follow up appointment set up with Dr. Azucena Cecil 9/12 at 11:15AM  (discussed with patient and she is aware)  Discharge Orders    Future Orders Please Complete By Expires   Diet - low sodium heart  healthy      Increase activity slowly      Discharge instructions      Comments:   Please take your medications as indicated.  Your prescription medications will be affordable per your discussion with the case manager and you should fill them today on discharge.   Follow up with a primary care physician in 1-2 weeks or sooner should you have any new concerns.   Call MD for:  temperature >100.4      Call MD for:  redness, tenderness, or signs of infection (pain, swelling, redness, odor or green/yellow discharge around incision site)      Call MD for:  persistant dizziness or  light-headedness      Call MD for:  difficulty breathing, headache or visual disturbances        Medication List  As of 06/02/2012  1:49 PM   TAKE these medications         amLODipine 10 MG tablet   Commonly known as: NORVASC   Take 1 tablet (10 mg total) by mouth daily.      aspirin 81 MG EC tablet   Take 1 tablet (81 mg total) by mouth daily.      ferrous sulfate 325 (65 FE) MG tablet   Take 1 tablet (325 mg total) by mouth 3 (three) times daily with meals.      irbesartan 300 MG tablet   Commonly known as: AVAPRO   Take 1 tablet (300 mg total) by mouth daily.      metFORMIN 500 MG tablet   Commonly known as: GLUCOPHAGE   Take 1 tablet (500 mg total) by mouth 2 (two) times daily with a meal.      metoprolol 50 MG tablet   Commonly known as: LOPRESSOR   Take 1 tablet (50 mg total) by mouth 2 (two) times daily.              The results of significant diagnostics from this hospitalization (including imaging, microbiology, ancillary and laboratory) are listed below for reference.    Significant Diagnostic Studies: Dg Chest 2 View  06/01/2012  *RADIOLOGY REPORT*  Clinical Data: Hypertension, shortness of breath  CHEST - 2 VIEW  Comparison: 11/06/2010  Findings: Study is markedly limited by patient's large body habitus.  Right lower lung is obscured by overlying breast tissue. No definite segmental infiltrate or pulmonary edema.  Cardiomegaly is noted.  Mild degenerative changes mid thoracic spine.  IMPRESSION: Cardiomegaly.  Markedly limited study by patient's large body habitus.  Right mid and lower lung is obscured by overlying breast tissue.  No definite focal infiltrate or pulmonary edema.   Original Report Authenticated By: Natasha Mead, M.D.     Microbiology: No results found for this or any previous visit (from the past 240 hour(s)).   Labs: Basic Metabolic Panel:  Lab 06/02/12 1610 06/01/12 1648  NA 133* 134*  K 3.2* 3.7  CL 97 99  CO2 26 27  GLUCOSE 276* 170*    BUN 9 9  CREATININE 0.60 0.58  CALCIUM 9.3 9.0  MG -- --  PHOS -- --   Liver Function Tests: No results found for this basename: AST:5,ALT:5,ALKPHOS:5,BILITOT:5,PROT:5,ALBUMIN:5 in the last 168 hours No results found for this basename: LIPASE:5,AMYLASE:5 in the last 168 hours No results found for this basename: AMMONIA:5 in the last 168 hours CBC:  Lab 06/02/12 0324 06/01/12 1648  WBC 8.3 7.0  NEUTROABS -- --  HGB 8.6* 8.7*  HCT 28.9* 29.2*  MCV 58.6* 59.1*  PLT 475* 517*   Cardiac Enzymes:  Lab 06/02/12 0920 06/02/12 0324 06/01/12 2213 06/01/12 1648  CKTOTAL -- -- -- --  CKMB -- -- -- --  CKMBINDEX -- -- -- --  TROPONINI <0.30 <0.30 <0.30 <0.30   BNP: BNP (last 3 results)  Basename 06/01/12 1648  PROBNP 420.2*   CBG:  Lab 06/02/12 1211 06/02/12 0806 06/01/12 2157 06/01/12 1556  GLUCAP 167* 223* 172* 172*    Time coordinating discharge: > 30 minutes  Signed:  Penny Pia  [facility] 06/02/2012, 1:49 PM

## 2012-06-02 NOTE — Progress Notes (Signed)
*  PRELIMINARY RESULTS* Echocardiogram 2D Echocardiogram has been performed.  Claudia [name] 06/02/2012, 2:27 PM

## 2012-06-16 ENCOUNTER — Other Ambulatory Visit: Payer: Self-pay | Admitting: Obstetrics and Gynecology

## 2012-06-16 ENCOUNTER — Other Ambulatory Visit (HOSPITAL_COMMUNITY)
Admission: RE | Admit: 2012-06-16 | Discharge: 2012-06-16 | Disposition: A | Discharge status: Home / Self Care | Payer: Medicaid Other | Source: Ambulatory Visit | Attending: Obstetrics and Gynecology | Admitting: Obstetrics and Gynecology

## 2012-06-16 DIAGNOSIS — Z1151 Encounter for screening for human papillomavirus (HPV): Secondary | ICD-10-CM | POA: Insufficient documentation

## 2012-06-16 DIAGNOSIS — Z01419 Encounter for gynecological examination (general) (routine) without abnormal findings: Secondary | ICD-10-CM | POA: Insufficient documentation

## 2012-06-22 ENCOUNTER — Other Ambulatory Visit: Payer: Self-pay | Admitting: Obstetrics and Gynecology

## 2012-09-28 HISTORY — PX: LAPAROSCOPIC ASSISTED VAGINAL HYSTERECTOMY: SHX5398

## 2012-11-02 ENCOUNTER — Inpatient Hospital Stay (HOSPITAL_COMMUNITY)
Admission: AD | Admit: 2012-11-02 | Discharge: 2012-11-02 | Disposition: A | Discharge status: Home / Self Care | Payer: Medicaid Other | Source: Ambulatory Visit | Attending: Obstetrics and Gynecology | Admitting: Obstetrics and Gynecology

## 2012-11-02 ENCOUNTER — Encounter (HOSPITAL_COMMUNITY): Payer: Self-pay | Admitting: *Deleted

## 2012-11-02 DIAGNOSIS — R209 Unspecified disturbances of skin sensation: Secondary | ICD-10-CM

## 2012-11-02 DIAGNOSIS — G8918 Other acute postprocedural pain: Secondary | ICD-10-CM | POA: Insufficient documentation

## 2012-11-02 DIAGNOSIS — R350 Frequency of micturition: Secondary | ICD-10-CM | POA: Insufficient documentation

## 2012-11-02 DIAGNOSIS — L7682 Other postprocedural complications of skin and subcutaneous tissue: Secondary | ICD-10-CM

## 2012-11-02 DIAGNOSIS — L918 Other hypertrophic disorders of the skin: Secondary | ICD-10-CM | POA: Insufficient documentation

## 2012-11-02 LAB — URINALYSIS, ROUTINE W REFLEX MICROSCOPIC
Bilirubin Urine: NEGATIVE
Leukocytes, UA: NEGATIVE
Nitrite: NEGATIVE
Specific Gravity, Urine: 1.025 (ref 1.005–1.030)
pH: 6 (ref 5.0–8.0)

## 2012-11-02 NOTE — MAU Provider Note (Signed)
History     CSN: [id number]  Arrival date and time: 11/02/12 1246   First Provider Initiated Contact with Patient 11/02/12 1436      Chief Complaint  Patient presents with  . Post-op Problem   HPI Claudia [name] 47 y.o. Comes to MAU with concerns about her incision being infected.  Had surgery in [place] on 10-23-11 (two weeks ago).  Had hysterectomy and umbilical hernia repair.  Husband assists her with cleaning of the incision and he noticed some drainage and some odor.  Also has been having urinary frequency.  Has been taking pain medication but abdominal pain has not worsened since her surgery.  Denies any fever.  OB History    Grav Para Term Preterm Abortions TAB SAB Ect Mult Living   4 4 3 1      3       Past Medical History  Diagnosis Date  . Diabetes mellitus   . Hypertension   . Hyperlipidemia     Past Surgical History  Procedure Date  . Tubal ligation   . Shoulder surgery   . Laparoscopic assisted vaginal hysterectomy   . Umbilical hernia repair     History reviewed. No pertinent family history.  History  Substance Use Topics  . Smoking status: Never Smoker   . Smokeless tobacco: Not on file  . Alcohol Use: No    Allergies:  Allergies  Allergen Reactions  . Pravachol (Pravastatin Sodium) Itching    Joint pain  . Vicodin (Hydrocodone-Acetaminophen) Itching    Prescriptions prior to admission  Medication Sig Dispense Refill  . amLODipine (NORVASC) 10 MG tablet Take 1 tablet (10 mg total) by mouth daily.  30 tablet  0  . docusate sodium (COLACE) 100 MG capsule Take 100 mg by mouth 2 (two) times daily.      . ferrous sulfate 325 (65 FE) MG tablet Take 1 tablet (325 mg total) by mouth 3 (three) times daily with meals.  90 tablet  0  . ibuprofen (ADVIL,MOTRIN) 800 MG tablet Take 800 mg by mouth every 8 (eight) hours as needed.      Marland Kitchen losartan (COZAAR) 100 MG tablet Take 100 mg by mouth daily.      . metFORMIN (GLUCOPHAGE) 1000 MG tablet Take 1,000 mg  by mouth 2 (two) times daily with a meal.      . metoprolol (LOPRESSOR) 50 MG tablet Take 1 tablet (50 mg total) by mouth 2 (two) times daily.  60 tablet  0  . oxyCODONE-acetaminophen (PERCOCET/ROXICET) 5-325 MG per tablet Take 1 tablet by mouth every 4 (four) hours as needed.      . Vitamin D, Ergocalciferol, (DRISDOL) 50000 UNITS CAPS Take 50,000 Units by mouth.        Review of Systems  Constitutional: Negative for fever.  Gastrointestinal: Positive for abdominal pain.       Drainage from umbilical incision  Genitourinary: Positive for frequency. Negative for dysuria, hematuria and flank pain.   Physical Exam   Blood pressure 149/95, pulse 77, temperature 98.3 F (36.8 C), temperature source Oral, resp. rate 20, weight 135.172 kg (298 lb), last menstrual period 05/09/2012.  Physical Exam  Nursing note and vitals reviewed. Constitutional: She is oriented to person, place, and time. She appears well-developed and well-nourished. No distress.       Obese  HENT:  Head: Normocephalic.  Eyes: EOM are normal.  Neck: Neck supple.  GI: Soft. There is tenderness. There is no rebound and no guarding.  Diffuse tenderness in all quadrants - has been the same since her surgery 2 weeks ago.  No redness, no edema, and no firmness palpated near any of the abdominal or umbilical incisions.  No odor noted.  3 small abdominal incisions noted which have healed very well.  Vertical umbilical incision at 12 and 6 oclock.  At 6 oclock, incision well fused, stitches noted, no drainage.  At 12 oclock, incision is approximated and no open area found when probed with sterile swab.  Is yellow in color and appears to have some granulation in center if incision.  Is slightly wet with some pale yellow but was completely dried with one sterile swab and only fourth of qtip sized swab was damp.    Musculoskeletal: Normal range of motion.  Neurological: She is alert and oriented to person, place, and time.  Skin:  Skin is warm and dry.  Psychiatric: She has a normal mood and affect.    MAU Course  Procedures Results for orders placed during the hospital encounter of 11/02/12 (from the past 24 hour(s))  URINALYSIS, ROUTINE W REFLEX MICROSCOPIC     Status: Normal   Collection Time   11/02/12  1:17 PM      Component Value Range   Color, Urine YELLOW  YELLOW   APPearance CLEAR  CLEAR   Specific Gravity, Urine 1.025  1.005 - 1.030   pH 6.0  5.0 - 8.0   Glucose, UA NEGATIVE  NEGATIVE mg/dL   Hgb urine dipstick NEGATIVE  NEGATIVE   Bilirubin Urine NEGATIVE  NEGATIVE   Ketones, ur NEGATIVE  NEGATIVE mg/dL   Protein, ur NEGATIVE  NEGATIVE mg/dL   Urobilinogen, UA 1.0  0.0 - 1.0 mg/dL   Nitrite NEGATIVE  NEGATIVE   Leukocytes, UA NEGATIVE  NEGATIVE    MDM 1455  Consult with Dr. Dion Body re: plan of care  Assessment and Plan  Intact incisions  - likely the umbilical incision is weepy from granulation tissue  Pain at incision - likely from granulation tissue Very small amount of drainage noted at umbilical incision No signs of infection identified.  Plan Follow up in [facility] as scheduled on Feb 25. Follow up with Dr. Dion Body next week. Call your doctor if you have questions or concerns. Claudia [name] 11/02/2012, 2:47 PM

## 2012-11-02 NOTE — MAU Note (Signed)
Frequency with urination

## 2012-11-02 NOTE — MAU Note (Signed)
Surgery  01/22 at [facility].  Claudia [name] is primary).  Had hysterectomy- had umbilical hernia- they went through that. Has been draining; husband thought had odor when changing dressing. no fever.

## 2013-05-01 ENCOUNTER — Emergency Department (HOSPITAL_COMMUNITY): Payer: Medicaid Other

## 2013-05-01 ENCOUNTER — Emergency Department (HOSPITAL_COMMUNITY)
Admission: EM | Admit: 2013-05-01 | Discharge: 2013-05-01 | Disposition: A | Discharge status: Home / Self Care | Payer: Medicaid Other | Attending: Emergency Medicine | Admitting: Emergency Medicine

## 2013-05-01 ENCOUNTER — Encounter (HOSPITAL_COMMUNITY): Payer: Self-pay

## 2013-05-01 DIAGNOSIS — R209 Unspecified disturbances of skin sensation: Secondary | ICD-10-CM | POA: Insufficient documentation

## 2013-05-01 DIAGNOSIS — E119 Type 2 diabetes mellitus without complications: Secondary | ICD-10-CM | POA: Insufficient documentation

## 2013-05-01 DIAGNOSIS — I10 Essential (primary) hypertension: Secondary | ICD-10-CM | POA: Insufficient documentation

## 2013-05-01 DIAGNOSIS — R202 Paresthesia of skin: Secondary | ICD-10-CM

## 2013-05-01 DIAGNOSIS — Z79899 Other long term (current) drug therapy: Secondary | ICD-10-CM | POA: Insufficient documentation

## 2013-05-01 DIAGNOSIS — Z862 Personal history of diseases of the blood and blood-forming organs and certain disorders involving the immune mechanism: Secondary | ICD-10-CM | POA: Insufficient documentation

## 2013-05-01 DIAGNOSIS — Z8639 Personal history of other endocrine, nutritional and metabolic disease: Secondary | ICD-10-CM | POA: Insufficient documentation

## 2013-05-01 LAB — COMPREHENSIVE METABOLIC PANEL
AST: 16 U/L (ref 0–37)
Albumin: 4.2 g/dL (ref 3.5–5.2)
BUN: 7 mg/dL (ref 6–23)
Creatinine, Ser: 0.62 mg/dL (ref 0.50–1.10)
Total Protein: 8.1 g/dL (ref 6.0–8.3)

## 2013-05-01 LAB — DIFFERENTIAL
Basophils Relative: 0 % (ref 0–1)
Eosinophils Absolute: 0.1 10*3/uL (ref 0.0–0.7)
Lymphs Abs: 3.8 10*3/uL (ref 0.7–4.0)
Neutro Abs: 3.7 10*3/uL (ref 1.7–7.7)
Neutrophils Relative %: 45 % (ref 43–77)

## 2013-05-01 LAB — CBC
Hemoglobin: 11.8 g/dL — ABNORMAL LOW (ref 12.0–15.0)
MCH: 22.5 pg — ABNORMAL LOW (ref 26.0–34.0)
Platelets: 389 10*3/uL (ref 150–400)
RBC: 5.25 MIL/uL — ABNORMAL HIGH (ref 3.87–5.11)

## 2013-05-01 LAB — TROPONIN I: Troponin I: <0.3 ng/mL (ref <0.30)

## 2013-05-01 LAB — POCT I-STAT TROPONIN I: Troponin i, poc: 0 ng/mL (ref 0.00–0.08)

## 2013-05-01 LAB — GLUCOSE, CAPILLARY: Glucose-Capillary: 192 mg/dL — ABNORMAL HIGH (ref 70–99)

## 2013-05-01 NOTE — ED Notes (Signed)
WAs driving down the road and pt. Rt. Side felt heavy and  She also felt like she had no control .  She also felt  Like a feather.   Pt. Denies any headache or does not feel shaky.  Pt. 's CBG 192.   Speech is clear, Alert and oriented X4  Bilateral hand grasps are equal.    No facial droop noted.

## 2013-05-01 NOTE — ED Notes (Signed)
Instructed pt. To come to the Nurse First Desk if her symptoms change

## 2013-05-01 NOTE — ED Provider Notes (Signed)
I have supervised the resident on the management of this patient and agree with the note above. I personally interviewed and examined the patient and my addendum is below.   Claudia [name] is a 47 y.o. female hx of HTN, DM here with R leg numbness around 4:45 pm today while driving. She said that she is numb from the calf down. Also worse RUE pain and numbness (which is chronic). Neuro exam significant for R leg slightly dec sensation below the knee, nl strength and cerebellar function. CT head nl, labs unremarkable. I think she likely has peripheral neuropathy. Will have her f/u outpatient.   Richardean Canal, MD 05/01/13 2006

## 2013-05-01 NOTE — ED Notes (Signed)
Dr. Campos at the bedside.  

## 2013-05-01 NOTE — ED Provider Notes (Signed)
CSN: [id number]     Arrival date & time 05/01/13  1650 History     First MD Initiated Contact with Patient 05/01/13 1822     Chief Complaint  Patient presents with  . Numbness   (Consider location/radiation/quality/duration/timing/severity/associated sxs/prior Treatment) HPI Claudia [name] is a 47 year old female with a PMH of DM, HTN, HLD, R shoulder laparoscopic procedure.  Patient reports that at 4:45pm today she was diving her car and she experienced a "heavyness" of her RLE and numbness, she reports that she was concerned she could not feel the break pedal despite knowing that she was pushing on it.  She does report a similar episode to this last month.  She does note that she has had chronic similar symptoms in her RUE, for which she has had a right shoulder laparoscopic surgery and sees Dr. Renae Fickle (orthopaedics).  She also notes that last thursday she saw Dr. Renae Fickle and received a steroid neck injection for which she has had minimal improvement.  She also reports seeing her PCP on Friday who is managing her DM, HTN, HLD, and Anemia.  Past Medical History  Diagnosis Date  . Diabetes mellitus   . Hypertension   . Hyperlipidemia    Past Surgical History  Procedure Laterality Date  . Tubal ligation    . Shoulder surgery    . Laparoscopic assisted vaginal hysterectomy    . Umbilical hernia repair     No family history on file. History  Substance Use Topics  . Smoking status: Never Smoker   . Smokeless tobacco: Not on file  . Alcohol Use: No   OB History   Grav Para Term Preterm Abortions TAB SAB Ect Mult Living   4 4 3 1      3      Review of Systems  Constitutional: Negative for fever, chills, diaphoresis and fatigue.  HENT: Negative for congestion, trouble swallowing, neck pain and neck stiffness.   Eyes: Negative for visual disturbance.  Respiratory: Negative for apnea, chest tightness, shortness of breath and wheezing.   Cardiovascular: Negative for chest pain, palpitations  and leg swelling.  Gastrointestinal: Negative for abdominal pain, diarrhea, constipation and blood in stool.  Genitourinary: Negative for dysuria and difficulty urinating.  Musculoskeletal: Negative for back pain, joint swelling and arthralgias.  Neurological: Positive for numbness. Negative for dizziness, tremors, seizures, syncope, facial asymmetry, speech difficulty, weakness, light-headedness and headaches.    Allergies  Pravachol and Vicodin  Home Medications   Current Outpatient Rx  Name  Route  Sig  Dispense  Refill  . amLODipine (NORVASC) 10 MG tablet   Oral   Take 1 tablet (10 mg total) by mouth daily.   30 tablet   0   . losartan (COZAAR) 100 MG tablet   Oral   Take 100 mg by mouth daily.         . metFORMIN (GLUCOPHAGE) 1000 MG tablet   Oral   Take 1,000 mg by mouth 2 (two) times daily with a meal.         . metoprolol (LOPRESSOR) 50 MG tablet   Oral   Take 1 tablet (50 mg total) by mouth 2 (two) times daily.   60 tablet   0   . Vitamin D, Ergocalciferol, (DRISDOL) 50000 UNITS CAPS capsule   Oral   Take 50,000 Units by mouth every 7 (seven) days. Mondays          BP 166/109  Pulse 81  Temp(Src) 98.6 F (37  C) (Oral)  Resp 20  SpO2 97%  LMP 05/09/2012 Physical Exam  Constitutional: She is oriented to person, place, and time. She appears well-developed and well-nourished. No distress.  HENT:  Head: Normocephalic and atraumatic.  Eyes: Conjunctivae are normal. Pupils are equal, round, and reactive to light. No scleral icterus.  Neck: Normal range of motion.  Cardiovascular: Normal rate, regular rhythm and normal heart sounds.  Exam reveals no gallop and no friction rub.   No murmur heard. Pulmonary/Chest: Effort normal. No respiratory distress. She has no wheezes. She has no rales.  Abdominal: Soft. She exhibits no distension. There is no tenderness.  Musculoskeletal: Normal range of motion. She exhibits no edema and no tenderness.  Neurological:  She is alert and oriented to person, place, and time. No cranial nerve deficit.  Normal sensation and muscle strength of upper and lower extremities b/l, normal finger to nose testing.  Skin: She is not diaphoretic.    ED Course   Procedures (including critical care time)  Labs Reviewed  CBC - Abnormal; Notable for the following:    RBC 5.25 (*)    Hemoglobin 11.8 (*)    MCV 70.1 (*)    MCH 22.5 (*)    All other components within normal limits  COMPREHENSIVE METABOLIC PANEL - Abnormal; Notable for the following:    Glucose, Bld 198 (*)    All other components within normal limits  GLUCOSE, CAPILLARY - Abnormal; Notable for the following:    Glucose-Capillary 192 (*)    All other components within normal limits  DIFFERENTIAL  TROPONIN I  POCT I-STAT TROPONIN I   Ct Head (brain) Wo Contrast  05/01/2013   *RADIOLOGY REPORT*  Clinical Data:  Parasthesias, numbness while driving  CT HEAD WITHOUT CONTRAST  Technique:  Contiguous axial images were obtained from the base of the skull through the vertex without contrast  Comparison:  10/18/2011  Findings:  The brain has a normal appearance without evidence for hemorrhage, acute infarction, hydrocephalus, or mass lesion.  There is no extra axial fluid collection.  The skull and paranasal sinuses are normal.  IMPRESSION: Normal CT of the head without contrast.   Original Report Authenticated By: Judie Petit. Miles Costain, M.D.   No diagnosis found.  MDM  47 year old female with complaints of transient RLE paresthesia below the knee, as well as chronic R shoulder tightness.  CT of head was negative for acute stroke.  Patient's symptoms have improved.  No focal deficits on exam.  Patient will be discharged home with follow up by PCP, at that time further neurologic testing can be discussed.  Patient instructed to return to ED if numbness worsenens or if she has new weakness.  Carlynn Purl, DO 05/01/13 [id number]

## 2013-07-19 ENCOUNTER — Ambulatory Visit: Payer: Medicaid Other | Admitting: Cardiology

## 2013-08-05 ENCOUNTER — Emergency Department (HOSPITAL_COMMUNITY)
Admission: EM | Admit: 2013-08-05 | Discharge: 2013-08-05 | Disposition: A | Discharge status: Home / Self Care | Payer: Medicaid Other | Attending: Emergency Medicine | Admitting: Emergency Medicine

## 2013-08-05 ENCOUNTER — Encounter (HOSPITAL_COMMUNITY): Payer: Self-pay | Admitting: Emergency Medicine

## 2013-08-05 DIAGNOSIS — Z7982 Long term (current) use of aspirin: Secondary | ICD-10-CM | POA: Insufficient documentation

## 2013-08-05 DIAGNOSIS — Z79899 Other long term (current) drug therapy: Secondary | ICD-10-CM | POA: Insufficient documentation

## 2013-08-05 DIAGNOSIS — R42 Dizziness and giddiness: Secondary | ICD-10-CM | POA: Insufficient documentation

## 2013-08-05 DIAGNOSIS — E119 Type 2 diabetes mellitus without complications: Secondary | ICD-10-CM | POA: Insufficient documentation

## 2013-08-05 DIAGNOSIS — R5381 Other malaise: Secondary | ICD-10-CM | POA: Insufficient documentation

## 2013-08-05 DIAGNOSIS — R109 Unspecified abdominal pain: Secondary | ICD-10-CM

## 2013-08-05 DIAGNOSIS — I10 Essential (primary) hypertension: Secondary | ICD-10-CM | POA: Insufficient documentation

## 2013-08-05 DIAGNOSIS — R1031 Right lower quadrant pain: Secondary | ICD-10-CM | POA: Insufficient documentation

## 2013-08-05 DIAGNOSIS — R55 Syncope and collapse: Secondary | ICD-10-CM

## 2013-08-05 DIAGNOSIS — R61 Generalized hyperhidrosis: Secondary | ICD-10-CM | POA: Insufficient documentation

## 2013-08-05 DIAGNOSIS — R1032 Left lower quadrant pain: Secondary | ICD-10-CM | POA: Insufficient documentation

## 2013-08-05 DIAGNOSIS — R739 Hyperglycemia, unspecified: Secondary | ICD-10-CM

## 2013-08-05 LAB — COMPREHENSIVE METABOLIC PANEL
ALT: 38 U/L — ABNORMAL HIGH (ref 0–35)
AST: 30 U/L (ref 0–37)
BUN: 11 mg/dL (ref 6–23)
GFR calc Af Amer: >90 mL/min (ref >90)
GFR calc non Af Amer: >90 mL/min (ref >90)
Potassium: 3.9 mEq/L (ref 3.5–5.1)
Total Bilirubin: 0.3 mg/dL (ref 0.3–1.2)
Total Protein: 8 g/dL (ref 6.0–8.3)

## 2013-08-05 LAB — CBC WITH DIFFERENTIAL/PLATELET
Basophils Absolute: 0 10*3/uL (ref 0.0–0.1)
Eosinophils Absolute: 0.2 10*3/uL (ref 0.0–0.7)
HCT: 38.7 % (ref 36.0–46.0)
Lymphocytes Relative: 34 % (ref 12–46)
MCHC: 32.6 g/dL (ref 30.0–36.0)
Neutro Abs: 4.2 10*3/uL (ref 1.7–7.7)
Neutrophils Relative %: 56 % (ref 43–77)
Platelets: 409 10*3/uL — ABNORMAL HIGH (ref 150–400)
RDW: 14 % (ref 11.5–15.5)

## 2013-08-05 LAB — URINALYSIS, ROUTINE W REFLEX MICROSCOPIC
Ketones, ur: NEGATIVE mg/dL
Leukocytes, UA: NEGATIVE
Nitrite: NEGATIVE
Protein, ur: 100 mg/dL — AB
Urobilinogen, UA: 1 mg/dL (ref 0.0–1.0)

## 2013-08-05 LAB — POCT I-STAT TROPONIN I: Troponin i, poc: 0.03 ng/mL (ref 0.00–0.08)

## 2013-08-05 LAB — LIPASE, BLOOD: Lipase: 36 U/L (ref 11–59)

## 2013-08-05 LAB — URINE MICROSCOPIC-ADD ON

## 2013-08-05 LAB — GLUCOSE, CAPILLARY: Glucose-Capillary: 365 mg/dL — ABNORMAL HIGH (ref 70–99)

## 2013-08-05 NOTE — ED Provider Notes (Signed)
CSN: [id number]     Arrival date & time 08/05/13  1825 History   First MD Initiated Contact with Patient 08/05/13 1836     Chief Complaint  Patient presents with  . Abdominal Pain   (Consider location/radiation/quality/duration/timing/severity/associated sxs/prior Treatment) HPI Comments: Pt felt lightheaded, faint, was diaphoretic, pale for about 15 minutes following abd pressure discomfort.  No CP, did not lose consciousness.  Did have a mild frontal HA earlier today.  Most all symptoms are resolved now.  She ate breakfast at [facility] with family around 0800, did have a BM there, was slightly loose, but she attributes to what she ate.  No blood, wasn't black or bloody.  No N/V today.  She didn't eat lunch.  Had a rehearsal this afternoon, and was eating a snack and drank some juice right before symptoms began. Pt has had a hysterectomy in the past and BTL.  Patient is a 47 y.o. female presenting with abdominal pain and weakness. The history is provided by the patient and the spouse.  Abdominal Pain Pain location:  Suprapubic, LLQ and RLQ Pain quality: aching and pressure   Pain severity:  Moderate Onset quality:  Sudden Duration:  1 hour Timing:  Constant Progression:  Resolved Chronicity:  New Context: eating   Context: not alcohol use, not diet changes, not retching, not sick contacts, not suspicious food intake and not trauma   Relieved by:  Nothing Worsened by:  Movement Associated symptoms: no chest pain, no chills, no constipation, no cough, no diarrhea, no dysuria, no fever, no nausea, no shortness of breath and no vomiting   Risk factors: multiple surgeries and obesity   Risk factors: no alcohol abuse, not elderly and not pregnant   Weakness This is a new problem. The current episode started 1 to 2 hours ago. The problem occurs constantly. The problem has been resolved. Associated symptoms include abdominal pain. Pertinent negatives include no chest pain and no shortness of  breath.    Past Medical History  Diagnosis Date  . Diabetes mellitus   . Hypertension   . Hyperlipidemia    Past Surgical History  Procedure Laterality Date  . Tubal ligation    . Shoulder surgery    . Laparoscopic assisted vaginal hysterectomy    . Umbilical hernia repair     History reviewed. No pertinent family history. History  Substance Use Topics  . Smoking status: Never Smoker   . Smokeless tobacco: Not on file  . Alcohol Use: No   OB History   Grav Para Term Preterm Abortions TAB SAB Ect Mult Living   4 4 3 1      3      Review of Systems  Constitutional: Negative for fever and chills.  Respiratory: Negative for cough and shortness of breath.   Cardiovascular: Negative for chest pain and palpitations.  Gastrointestinal: Positive for abdominal pain. Negative for nausea, vomiting, diarrhea and constipation.  Genitourinary: Negative for dysuria, frequency and flank pain.  Musculoskeletal: Negative for back pain.  Neurological: Positive for dizziness, weakness and light-headedness.  All other systems reviewed and are negative.    Allergies  Pravachol and Vicodin  Home Medications   Current Outpatient Rx  Name  Route  Sig  Dispense  Refill  . amLODipine (NORVASC) 10 MG tablet   Oral   Take 10 mg by mouth daily.         . ASPIRIN PO   Oral   Take 1 tablet by mouth daily.         Marland Kitchen  losartan (COZAAR) 100 MG tablet   Oral   Take 100 mg by mouth daily.         . metFORMIN (GLUCOPHAGE) 1000 MG tablet   Oral   Take 1,000 mg by mouth 2 (two) times daily with a meal.         . metoprolol (LOPRESSOR) 50 MG tablet   Oral   Take 50 mg by mouth 2 (two) times daily.         . Vitamin D, Ergocalciferol, (DRISDOL) 50000 UNITS CAPS capsule   Oral   Take 50,000 Units by mouth every 7 (seven) days. Mondays         . EXPIRED: amLODipine (NORVASC) 10 MG tablet   Oral   Take 1 tablet (10 mg total) by mouth daily.   30 tablet   0    BP 146/101   Pulse 96  Temp(Src) 97.5 F (36.4 C) (Oral)  Resp 18  SpO2 96%  LMP 05/09/2012 Physical Exam  Nursing note and vitals reviewed. Constitutional: She is oriented to person, place, and time. She appears well-developed and well-nourished. No distress.  HENT:  Head: Normocephalic and atraumatic.  Eyes: Conjunctivae and EOM are normal. No scleral icterus.  Neck: Neck supple.  Cardiovascular: Normal rate, regular rhythm and intact distal pulses.   No murmur heard. Pulmonary/Chest: Effort normal. No respiratory distress. She has no wheezes.  Abdominal: Soft. She exhibits no distension. There is no tenderness. There is no rebound.  Neurological: She is alert and oriented to person, place, and time. Coordination normal.  Skin: Skin is warm and dry. No rash noted. She is not diaphoretic.  Psychiatric: She has a normal mood and affect.    ED Course  Procedures (including critical care time) Labs Review Labs Reviewed  URINALYSIS, ROUTINE W REFLEX MICROSCOPIC - Abnormal; Notable for the following:    Specific Gravity, Urine 1.037 (*)    Glucose, UA >1000 (*)    Protein, ur 100 (*)    All other components within normal limits  CBC WITH DIFFERENTIAL - Abnormal; Notable for the following:    RBC 5.55 (*)    MCV 69.7 (*)    MCH 22.7 (*)    Platelets 409 (*)    All other components within normal limits  COMPREHENSIVE METABOLIC PANEL - Abnormal; Notable for the following:    Sodium 134 (*)    Chloride 95 (*)    Glucose, Bld 365 (*)    ALT 38 (*)    All other components within normal limits  GLUCOSE, CAPILLARY - Abnormal; Notable for the following:    Glucose-Capillary 365 (*)    All other components within normal limits  LIPASE, BLOOD  URINE MICROSCOPIC-ADD ON  POCT I-STAT TROPONIN I   Imaging Review No results found.  EKG Interpretation     Ventricular Rate:  101 PR Interval:  175 QRS Duration: 102 QT Interval:  381 QTC Calculation: 494 R Axis:   110 Text Interpretation:   Sinus tachycardia Right axis deviation Borderline prolonged QT interval No significant change since last tracing           9:28 PM Pt feels improved, no re-occurrences, has ambulated and has some mild pressure in lower abdomen, btu on exam, no guard or rebound.  Pt and family would like to go home, no imaging done, do not feel it is indicated with benign abd exam now.  Pt understands to return if she feels worse or to follow up with PCP later  this week.  No CP, not anemic, troponin is normal.   MDM   1. Abdominal pain   2. Near syncope   3. Hyperglycemia without ketosis      Pt with near syncope, had prodrome, did not have any CP, SOB, back pain.  No priro h/o cardiac, although has DM.  No reent signs of illness, is afebrile.  VS are ok now.  Will check labs, UA, ECG, troponin and monitor on telemetry in the ED for a period.  Abd is soft, no guard or rebound currently.      Gavin Pound. Oletta Lamas, MD 08/05/13 2128

## 2013-08-05 NOTE — ED Notes (Signed)
Pt reports severe abd pain. Pt denies n/v/d but reports diaphoresis. Pt has hx of diabetes and has taken her metformin today.

## 2013-08-05 NOTE — Discharge Instructions (Signed)
 Abdominal Pain, Women Abdominal (stomach, pelvic, or belly) pain can be caused by many things. It is important to tell your doctor:  The location of the pain.  Does it come and go or is it present all the time?  Are there things that start the pain (eating certain foods, exercise)?  Are there other symptoms associated with the pain (fever, nausea, vomiting, diarrhea)? All of this is helpful to know when trying to find the cause of the pain. CAUSES   Stomach: virus or bacteria infection, or ulcer.  Intestine: appendicitis (inflamed appendix), regional ileitis (Crohn's disease), ulcerative colitis (inflamed colon), irritable bowel syndrome, diverticulitis (inflamed diverticulum of the colon), or cancer of the stomach or intestine.  Gallbladder disease or stones in the gallbladder.  Kidney disease, kidney stones, or infection.  Pancreas infection or cancer.  Fibromyalgia (pain disorder).  Diseases of the female organs:  Uterus: fibroid (non-cancerous) tumors or infection.  Fallopian tubes: infection or tubal pregnancy.  Ovary: cysts or tumors.  Pelvic adhesions (scar tissue).  Endometriosis (uterus lining tissue growing in the pelvis and on the pelvic organs).  Pelvic congestion syndrome (female organs filling up with blood just before the menstrual period).  Pain with the menstrual period.  Pain with ovulation (producing an egg).  Pain with an IUD (intrauterine device, birth control) in the uterus.  Cancer of the female organs.  Functional pain (pain not caused by a disease, may improve without treatment).  Psychological pain.  Depression. DIAGNOSIS  Your doctor will decide the seriousness of your pain by doing an examination.  Blood tests.  X-rays.  Ultrasound.  CT scan (computed tomography, special type of X-ray).  MRI (magnetic resonance imaging).  Cultures, for infection.  Barium enema (dye inserted in the large intestine, to better view it with  X-rays).  Colonoscopy (looking in intestine with a lighted tube).  Laparoscopy (minor surgery, looking in abdomen with a lighted tube).  Major abdominal exploratory surgery (looking in abdomen with a large incision). TREATMENT  The treatment will depend on the cause of the pain.   Many cases can be observed and treated at home.  Over-the-counter medicines recommended by your caregiver.  Prescription medicine.  Antibiotics, for infection.  Birth control pills, for painful periods or for ovulation pain.  Hormone treatment, for endometriosis.  Nerve blocking injections.  Physical therapy.  Antidepressants.  Counseling with a psychologist or psychiatrist.  Minor or major surgery. HOME CARE INSTRUCTIONS   Do not take laxatives, unless directed by your caregiver.  Take over-the-counter pain medicine only if ordered by your caregiver. Do not take aspirin  because it can cause an upset stomach or bleeding.  Try a clear liquid diet (broth or water) as ordered by your caregiver. Slowly move to a bland diet, as tolerated, if the pain is related to the stomach or intestine.  Have a thermometer and take your temperature several times a day, and record it.  Bed rest and sleep, if it helps the pain.  Avoid sexual intercourse, if it causes pain.  Avoid stressful situations.  Keep your follow-up appointments and tests, as your caregiver orders.  If the pain does not go away with medicine or surgery, you may try:  Acupuncture.  Relaxation exercises (yoga, meditation).  Group therapy.  Counseling. SEEK MEDICAL CARE IF:   You notice certain foods cause stomach pain.  Your home care treatment is not helping your pain.  You need stronger pain medicine.  You want your IUD removed.  You feel faint or  lightheaded.  You develop nausea and vomiting.  You develop a rash.  You are having side effects or an allergy to your medicine. SEEK IMMEDIATE MEDICAL CARE IF:   Your  pain does not go away or gets worse.  You have a fever.  Your pain is felt only in portions of the abdomen. The right side could possibly be appendicitis. The left lower portion of the abdomen could be colitis or diverticulitis.  You are passing blood in your stools (bright red or black tarry stools, with or without vomiting).  You have blood in your urine.  You develop chills, with or without a fever.  You pass out. MAKE SURE YOU:   Understand these instructions.  Will watch your condition.  Will get help right away if you are not doing well or get worse. Document Released: 07/12/2007 Document Revised: 12/07/2011 Document Reviewed: 08/01/2009 [facility] Patient Information 2014 [place], [place].     Near-Syncope Near-syncope (commonly known as near fainting) is sudden weakness, dizziness, or feeling like you might pass out. During an episode of near-syncope, you may also develop pale skin, have tunnel vision, or feel sick to your stomach (nauseous). Near-syncope may occur when getting up after sitting or while standing for a long time. It is caused by a sudden decrease in blood flow to the brain. This decrease can result from various causes or triggers, most of which are not serious. However, because near-syncope can sometimes be a sign of something serious, a medical evaluation is required. The specific cause is often not determined. HOME CARE INSTRUCTIONS  Monitor your condition for any changes. The following actions may help to alleviate any discomfort you are experiencing:  Have someone stay with you until you feel stable.  Lie down right away if you start feeling like you might faint. Breathe deeply and steadily. Wait until all the symptoms have passed. Most of these episodes last only a few minutes. You may feel tired for several hours.   Drink enough fluids to keep your urine clear or pale yellow.   If you are taking blood pressure or heart medicine, get up slowly when  seated or lying down. Take several minutes to sit and then stand. This can reduce dizziness.  Follow up with your health care provider as directed. SEEK IMMEDIATE MEDICAL CARE IF:   You have a severe headache.   You have unusual pain in the chest, abdomen, or back.   You are bleeding from the mouth or rectum, or you have black or tarry stool.   You have an irregular or very fast heartbeat.   You have repeated fainting or have seizure-like jerking during an episode.   You faint when sitting or lying down.   You have confusion.   You have difficulty walking.   You have severe weakness.   You have vision problems.  MAKE SURE YOU:   Understand these instructions.  Will watch your condition.  Will get help right away if you are not doing well or get worse. Document Released: 09/14/2005 Document Revised: 05/17/2013 Document Reviewed: 02/17/2013 [facility] Patient Information 2014 [place], [place].

## 2013-08-05 NOTE — ED Notes (Signed)
Ghim, MD at bedside. 

## 2013-08-05 NOTE — ED Notes (Signed)
Patient is alert and oriented x3.  She was given DC instructions and follow up visit instructions.  Patient gave verbal understanding. She was DC ambulatory under her own power to home.  V/S stable.  He was not showing any signs of distress on DC 

## 2013-08-23 ENCOUNTER — Other Ambulatory Visit: Payer: Self-pay | Admitting: Orthopedic Surgery

## 2013-08-31 ENCOUNTER — Encounter (HOSPITAL_COMMUNITY): Payer: Self-pay | Admitting: Pharmacy Technician

## 2013-09-04 ENCOUNTER — Ambulatory Visit: Payer: Medicaid Other | Admitting: Cardiology

## 2013-09-04 NOTE — Pre-Procedure Instructions (Addendum)
Claudia [name]  09/04/2013   Your procedure is scheduled on:  09/07/13  Report to [facility] short stay admitting at 930 per dr AM.  Call this number if you have problems the morning of surgery: [phone]   Remember:   Do not eat food or drink liquids after midnight.   Take these medicines the morning of surgery with A SIP OF WATER: amlodipine, metoprolol        STOP all herbel meds, nsaids (aleve,naproxen,advil,ibuprofen) now including aspirin, fish oil, vitamins   Do not wear jewelry, make-up or nail polish.  Do not wear lotions, powders, or perfumes. You may wear deodorant.  Do not shave 48 hours prior to surgery. Men may shave face and neck.  Do not bring valuables to the hospital.  [facility] is not responsible                  for any belongings or valuables.               Contacts, dentures or bridgework may not be worn into surgery.  Leave suitcase in the car. After surgery it may be brought to your room.  For patients admitted to the hospital, discharge time is determined by your                treatment team.               Patients discharged the day of surgery will not be allowed to drive  home.  Name and phone number of your driver:   Special Instructions: Shower using CHG 2 nights before surgery and the night before surgery.  If you shower the day of surgery use CHG.  Use special wash - you have one bottle of CHG for all showers.  You should use approximately 1/3 of the bottle for each shower.   Please read over the following fact sheets that you were given: Pain Booklet, Coughing and Deep Breathing, Blood Transfusion Information, MRSA Information and Surgical Site Infection Prevention

## 2013-09-05 ENCOUNTER — Encounter (HOSPITAL_COMMUNITY): Payer: Self-pay

## 2013-09-05 ENCOUNTER — Encounter (HOSPITAL_COMMUNITY)
Admission: RE | Admit: 2013-09-05 | Discharge: 2013-09-05 | Disposition: A | Discharge status: Home / Self Care | Payer: Medicaid Other | Source: Ambulatory Visit | Attending: Orthopedic Surgery | Admitting: Orthopedic Surgery

## 2013-09-05 ENCOUNTER — Ambulatory Visit (HOSPITAL_COMMUNITY)
Admission: RE | Admit: 2013-09-05 | Discharge: 2013-09-05 | Disposition: A | Discharge status: Home / Self Care | Payer: Medicaid Other | Source: Ambulatory Visit | Attending: Orthopedic Surgery | Admitting: Orthopedic Surgery

## 2013-09-05 DIAGNOSIS — I517 Cardiomegaly: Secondary | ICD-10-CM | POA: Insufficient documentation

## 2013-09-05 DIAGNOSIS — Z01818 Encounter for other preprocedural examination: Secondary | ICD-10-CM | POA: Insufficient documentation

## 2013-09-05 DIAGNOSIS — E119 Type 2 diabetes mellitus without complications: Secondary | ICD-10-CM | POA: Insufficient documentation

## 2013-09-05 DIAGNOSIS — I10 Essential (primary) hypertension: Secondary | ICD-10-CM | POA: Insufficient documentation

## 2013-09-05 HISTORY — DX: Headache: R51

## 2013-09-05 HISTORY — DX: Unspecified convulsions: R56.9

## 2013-09-05 LAB — APTT: aPTT: 31 seconds (ref 24–37)

## 2013-09-05 LAB — CBC WITH DIFFERENTIAL/PLATELET
Basophils Relative: 0 % (ref 0–1)
Eosinophils Relative: 2 % (ref 0–5)
HCT: 40.1 % (ref 36.0–46.0)
Hemoglobin: 12.9 g/dL (ref 12.0–15.0)
Lymphs Abs: 2.8 10*3/uL (ref 0.7–4.0)
MCH: 22.4 pg — ABNORMAL LOW (ref 26.0–34.0)
MCV: 69.6 fL — ABNORMAL LOW (ref 78.0–100.0)
Monocytes Absolute: 0.6 10*3/uL (ref 0.1–1.0)
Monocytes Relative: 8 % (ref 3–12)
Neutro Abs: 3.8 10*3/uL (ref 1.7–7.7)
Neutrophils Relative %: 51 % (ref 43–77)
Platelets: 406 10*3/uL — ABNORMAL HIGH (ref 150–400)
RBC: 5.76 MIL/uL — ABNORMAL HIGH (ref 3.87–5.11)

## 2013-09-05 LAB — COMPREHENSIVE METABOLIC PANEL
AST: 28 U/L (ref 0–37)
BUN: 7 mg/dL (ref 6–23)
CO2: 24 mEq/L (ref 19–32)
Calcium: 9.8 mg/dL (ref 8.4–10.5)
Chloride: 96 mEq/L (ref 96–112)
Creatinine, Ser: 0.53 mg/dL (ref 0.50–1.10)
GFR calc Af Amer: >90 mL/min (ref >90)
GFR calc non Af Amer: >90 mL/min (ref >90)
Glucose, Bld: 294 mg/dL — ABNORMAL HIGH (ref 70–99)
Sodium: 134 mEq/L — ABNORMAL LOW (ref 135–145)
Total Bilirubin: 0.6 mg/dL (ref 0.3–1.2)

## 2013-09-05 LAB — URINE MICROSCOPIC-ADD ON

## 2013-09-05 LAB — URINALYSIS, ROUTINE W REFLEX MICROSCOPIC
Bilirubin Urine: NEGATIVE
Glucose, UA: >1000 mg/dL — AB
Hgb urine dipstick: NEGATIVE
Ketones, ur: 40 mg/dL — AB
Leukocytes, UA: NEGATIVE
Nitrite: NEGATIVE
Protein, ur: NEGATIVE mg/dL
pH: 5.5 (ref 5.0–8.0)

## 2013-09-05 LAB — PROTIME-INR
INR: 0.86 (ref 0.00–1.49)
Prothrombin Time: 11.6 seconds (ref 11.6–15.2)

## 2013-09-05 LAB — ABO/RH: ABO/RH(D): B POS

## 2013-09-05 LAB — TYPE AND SCREEN: ABO/RH(D): B POS

## 2013-09-06 MED ORDER — DEXTROSE 5 % IV SOLN
3.0000 g | INTRAVENOUS | Status: AC
  Administered 2013-09-07: 3 g via INTRAVENOUS
  Filled 2013-09-06: qty 3000

## 2013-09-06 NOTE — Progress Notes (Signed)
Anesthesia Chart Review:  Patient is a 47 year old female scheduled for C5-6 ACDF on 09/07/13 by Dr.  Yevette Edwards.  History includes HTN, DM2, HLD, non-smoker, childhood seizure, headaches, morbid obesity, hysterectomy in 09/2012 ([facility]).  PCP is Dr. Tally Joe. She was recently referred to endocrinologist Dr. Sharl Ma, but appointment is not scheduled until early next year.  Cardiologist is Dr. Donato Schultz, last visit 01/17/13. He had previously cleared her for her hysterectomy. At PAT, her BP was 172/110 and then rechecked at 142/102.  She reports she was in pain because the surgeon's office told her to stop all pain meds except Tylenol which she did not have on hand.  She reports her BP on Monday was 144/96. She denies chest pain, SOB at rest, or dyspnea with walking.    Meds: Norvasc, ASA, losartan, metformin, Fish Oil, Crestor, metoprolol, Vitamin D.  EKG on 08/05/13 showed ST @ 101 bpm, RAD, borderline prolonged QT interval.  Echo on 06/02/12 showed: - Left ventricle: The cavity size was normal. There was moderate concentric hypertrophy. Systolic function was mildly to moderately reduced. The estimated ejection fraction was in the range of 40% to 45%. Diffuse hypokinesis. Doppler parameters are consistent with abnormal left ventricular relaxation (grade 1 diastolic dysfunction). - Left atrium: The atrium was mildly dilated.  Holter in 11/2010 showed multiple PVC's, rare PACs, no afib.  B-blocker therapy was recommended.  CXR on 09/05/13 showed cardiomegaly, no acute cardiopulmonary process.    Preoperative labs noted.  Glucose is 294. UA shows > 1000 glucose. H/H 12.9/40.1. She reports poorly controlled DM.  She is unsure of her last A1C.  She does not check her fasting glucose on a regular basis, but it is usually in the 200 range--sometimes even high 200's.  I reviewed above with anesthesiologist Dr. Gypsy Balsam.  Since glucose typically in the 200 range, should be able to cover with insulin if  needed on the day of surgery. Will have patient take all three BP meds on the morning of surgery.  Patient notified.  Message also left for Carla at Dr. Marshell Levan office.  Velna Ochs [facility] Short Stay Center/Anesthesiology Phone [phone] 09/06/2013 1:13 PM

## 2013-09-07 ENCOUNTER — Inpatient Hospital Stay (HOSPITAL_COMMUNITY)
Admission: RE | Admit: 2013-09-07 | Discharge: 2013-09-08 | DRG: 473 | Disposition: A | Discharge status: Home / Self Care | Payer: Medicaid Other | Source: Ambulatory Visit | Attending: Orthopedic Surgery | Admitting: Orthopedic Surgery

## 2013-09-07 ENCOUNTER — Encounter (HOSPITAL_COMMUNITY): Payer: Medicaid Other | Admitting: Vascular Surgery

## 2013-09-07 ENCOUNTER — Encounter (HOSPITAL_COMMUNITY): Payer: Self-pay | Admitting: *Deleted

## 2013-09-07 ENCOUNTER — Ambulatory Visit (HOSPITAL_COMMUNITY): Payer: Medicaid Other

## 2013-09-07 ENCOUNTER — Encounter (HOSPITAL_COMMUNITY)
Admission: RE | Disposition: A | Discharge status: Home / Self Care | Payer: Self-pay | Source: Ambulatory Visit | Attending: Orthopedic Surgery

## 2013-09-07 ENCOUNTER — Ambulatory Visit (HOSPITAL_COMMUNITY): Payer: Medicaid Other | Admitting: Certified Registered Nurse Anesthetist

## 2013-09-07 DIAGNOSIS — E7800 Pure hypercholesterolemia, unspecified: Secondary | ICD-10-CM | POA: Diagnosis present

## 2013-09-07 DIAGNOSIS — I10 Essential (primary) hypertension: Secondary | ICD-10-CM | POA: Diagnosis present

## 2013-09-07 DIAGNOSIS — E119 Type 2 diabetes mellitus without complications: Secondary | ICD-10-CM | POA: Diagnosis present

## 2013-09-07 DIAGNOSIS — M5090 Cervical disc disorder, unspecified, unspecified cervical region: Principal | ICD-10-CM | POA: Diagnosis present

## 2013-09-07 DIAGNOSIS — M5410 Radiculopathy, site unspecified: Secondary | ICD-10-CM | POA: Diagnosis present

## 2013-09-07 DIAGNOSIS — E785 Hyperlipidemia, unspecified: Secondary | ICD-10-CM | POA: Diagnosis present

## 2013-09-07 DIAGNOSIS — M1990 Unspecified osteoarthritis, unspecified site: Secondary | ICD-10-CM | POA: Diagnosis present

## 2013-09-07 HISTORY — PX: ANTERIOR CERVICAL DECOMP/DISCECTOMY FUSION: SHX1161

## 2013-09-07 LAB — GLUCOSE, CAPILLARY
Glucose-Capillary: 252 mg/dL — ABNORMAL HIGH (ref 70–99)
Glucose-Capillary: 244 mg/dL — ABNORMAL HIGH (ref 70–99)
Glucose-Capillary: 245 mg/dL — ABNORMAL HIGH (ref 70–99)

## 2013-09-07 SURGERY — ANTERIOR CERVICAL DECOMPRESSION/DISCECTOMY FUSION 1 LEVEL
Anesthesia: General | Site: Neck

## 2013-09-07 MED ORDER — POVIDONE-IODINE 7.5 % EX SOLN
Freq: Once | CUTANEOUS | Status: AC
  Administered 2013-09-07: 10:00:00 via TOPICAL
  Filled 2013-09-07: qty 118

## 2013-09-07 MED ORDER — LACTATED RINGERS IV SOLN
INTRAVENOUS | Status: DC | PRN
  Administered 2013-09-07 (×2): via INTRAVENOUS

## 2013-09-07 MED ORDER — LACTATED RINGERS IV SOLN
INTRAVENOUS | Status: DC
  Administered 2013-09-07: 10:00:00 via INTRAVENOUS

## 2013-09-07 MED ORDER — LOSARTAN POTASSIUM 50 MG PO TABS
100.0000 mg | ORAL_TABLET | Freq: Every day | ORAL | Status: DC
  Administered 2013-09-08: 100 mg via ORAL
  Filled 2013-09-07 (×2): qty 2

## 2013-09-07 MED ORDER — AMLODIPINE BESYLATE 10 MG PO TABS
10.0000 mg | ORAL_TABLET | Freq: Every day | ORAL | Status: DC
  Administered 2013-09-08: 10 mg via ORAL
  Filled 2013-09-07 (×2): qty 1

## 2013-09-07 MED ORDER — MENTHOL 3 MG MT LOZG: 1.0000 | LOZENGE | OROMUCOSAL | Status: DC | PRN

## 2013-09-07 MED ORDER — HYDROMORPHONE HCL PF 1 MG/ML IJ SOLN
INTRAMUSCULAR | Status: AC
  Filled 2013-09-07: qty 1

## 2013-09-07 MED ORDER — BISACODYL 5 MG PO TBEC: 5.0000 mg | DELAYED_RELEASE_TABLET | Freq: Every day | ORAL | Status: DC | PRN

## 2013-09-07 MED ORDER — ALUM & MAG HYDROXIDE-SIMETH 200-200-20 MG/5ML PO SUSP: 30.0000 mL | Freq: Four times a day (QID) | ORAL | Status: DC | PRN

## 2013-09-07 MED ORDER — PHENYLEPHRINE HCL 10 MG/ML IJ SOLN
10.0000 mg | INTRAVENOUS | Status: DC | PRN
  Administered 2013-09-07: 50 ug/min via INTRAVENOUS

## 2013-09-07 MED ORDER — ACETAMINOPHEN 325 MG PO TABS: 650.0000 mg | ORAL_TABLET | ORAL | Status: DC | PRN

## 2013-09-07 MED ORDER — HYDROMORPHONE HCL PF 1 MG/ML IJ SOLN
0.2500 mg | INTRAMUSCULAR | Status: DC | PRN
  Administered 2013-09-07 (×2): 0.5 mg via INTRAVENOUS

## 2013-09-07 MED ORDER — DIPHENHYDRAMINE HCL 50 MG/ML IJ SOLN: 25.0000 mg | Freq: Four times a day (QID) | INTRAMUSCULAR | Status: DC | PRN

## 2013-09-07 MED ORDER — ONDANSETRON HCL 4 MG/2ML IJ SOLN: 4.0000 mg | INTRAMUSCULAR | Status: DC | PRN

## 2013-09-07 MED ORDER — PHENOL 1.4 % MT LIQD
1.0000 | OROMUCOSAL | Status: DC | PRN
  Filled 2013-09-07: qty 177

## 2013-09-07 MED ORDER — SODIUM CHLORIDE 0.9 % IV SOLN: 250.0000 mL | INTRAVENOUS | Status: DC

## 2013-09-07 MED ORDER — PHENYLEPHRINE HCL 10 MG/ML IJ SOLN
INTRAMUSCULAR | Status: DC | PRN
  Administered 2013-09-07 (×2): 120 ug via INTRAVENOUS

## 2013-09-07 MED ORDER — ARTIFICIAL TEARS OP OINT
TOPICAL_OINTMENT | OPHTHALMIC | Status: DC | PRN
  Administered 2013-09-07: 1 via OPHTHALMIC

## 2013-09-07 MED ORDER — PROPOFOL 10 MG/ML IV BOLUS
INTRAVENOUS | Status: DC | PRN
  Administered 2013-09-07: 200 mg via INTRAVENOUS

## 2013-09-07 MED ORDER — LABETALOL HCL 5 MG/ML IV SOLN
10.0000 mg | INTRAVENOUS | Status: DC | PRN
  Administered 2013-09-07 (×2): 10 mg via INTRAVENOUS

## 2013-09-07 MED ORDER — SODIUM CHLORIDE 0.9 % IJ SOLN: 3.0000 mL | INTRAMUSCULAR | Status: DC | PRN

## 2013-09-07 MED ORDER — METOPROLOL TARTRATE 50 MG PO TABS
50.0000 mg | ORAL_TABLET | Freq: Two times a day (BID) | ORAL | Status: DC
  Administered 2013-09-07 – 2013-09-08 (×2): 50 mg via ORAL
  Filled 2013-09-07 (×3): qty 1

## 2013-09-07 MED ORDER — MIDAZOLAM HCL 5 MG/5ML IJ SOLN
INTRAMUSCULAR | Status: DC | PRN
  Administered 2013-09-07 (×2): 2 mg via INTRAVENOUS

## 2013-09-07 MED ORDER — SODIUM CHLORIDE 0.9 % IJ SOLN: 3.0000 mL | Freq: Two times a day (BID) | INTRAMUSCULAR | Status: DC

## 2013-09-07 MED ORDER — DIPHENHYDRAMINE HCL 25 MG PO CAPS
25.0000 mg | ORAL_CAPSULE | ORAL | Status: DC | PRN
  Administered 2013-09-07 – 2013-09-08 (×3): 25 mg via ORAL
  Filled 2013-09-07 (×3): qty 1

## 2013-09-07 MED ORDER — FENTANYL CITRATE 0.05 MG/ML IJ SOLN
INTRAMUSCULAR | Status: DC | PRN
  Administered 2013-09-07: 50 ug via INTRAVENOUS
  Administered 2013-09-07 (×3): 100 ug via INTRAVENOUS

## 2013-09-07 MED ORDER — THROMBIN 20000 UNITS EX SOLR
OROMUCOSAL | Status: DC | PRN
  Administered 2013-09-07: 13:00:00 via TOPICAL

## 2013-09-07 MED ORDER — ONDANSETRON HCL 4 MG/2ML IJ SOLN
INTRAMUSCULAR | Status: DC | PRN
  Administered 2013-09-07: 4 mg via INTRAVENOUS

## 2013-09-07 MED ORDER — LIDOCAINE HCL (CARDIAC) 20 MG/ML IV SOLN
INTRAVENOUS | Status: DC | PRN
  Administered 2013-09-07: 100 mg via INTRAVENOUS

## 2013-09-07 MED ORDER — OXYCODONE-ACETAMINOPHEN 5-325 MG PO TABS
1.0000 | ORAL_TABLET | ORAL | Status: DC | PRN
  Administered 2013-09-07 – 2013-09-08 (×3): 2 via ORAL
  Filled 2013-09-07 (×3): qty 2

## 2013-09-07 MED ORDER — BUPIVACAINE-EPINEPHRINE 0.25% -1:200000 IJ SOLN
INTRAMUSCULAR | Status: DC | PRN
  Administered 2013-09-07: 3.5 mL

## 2013-09-07 MED ORDER — MORPHINE SULFATE 2 MG/ML IJ SOLN
1.0000 mg | INTRAMUSCULAR | Status: DC | PRN
  Administered 2013-09-08 (×3): 2 mg via INTRAVENOUS
  Filled 2013-09-07 (×3): qty 1

## 2013-09-07 MED ORDER — DOCUSATE SODIUM 100 MG PO CAPS
100.0000 mg | ORAL_CAPSULE | Freq: Two times a day (BID) | ORAL | Status: DC
  Administered 2013-09-07 – 2013-09-08 (×2): 100 mg via ORAL
  Filled 2013-09-07 (×3): qty 1

## 2013-09-07 MED ORDER — DIAZEPAM 5 MG PO TABS
5.0000 mg | ORAL_TABLET | Freq: Four times a day (QID) | ORAL | Status: DC | PRN
  Administered 2013-09-07: 5 mg via ORAL
  Filled 2013-09-07: qty 1

## 2013-09-07 MED ORDER — CEFAZOLIN SODIUM 1-5 GM-% IV SOLN
1.0000 g | Freq: Three times a day (TID) | INTRAVENOUS | Status: AC
  Administered 2013-09-07 – 2013-09-08 (×2): 1 g via INTRAVENOUS
  Filled 2013-09-07 (×2): qty 50

## 2013-09-07 MED ORDER — ATORVASTATIN CALCIUM 10 MG PO TABS
10.0000 mg | ORAL_TABLET | Freq: Every day | ORAL | Status: DC
  Administered 2013-09-07: 10 mg via ORAL
  Filled 2013-09-07 (×2): qty 1

## 2013-09-07 MED ORDER — LABETALOL HCL 5 MG/ML IV SOLN
INTRAVENOUS | Status: AC
  Administered 2013-09-07: 10 mg via INTRAVENOUS
  Filled 2013-09-07: qty 4

## 2013-09-07 MED ORDER — FLEET ENEMA 7-19 GM/118ML RE ENEM: 1.0000 | ENEMA | Freq: Once | RECTAL | Status: AC | PRN

## 2013-09-07 MED ORDER — BUPIVACAINE-EPINEPHRINE (PF) 0.25% -1:200000 IJ SOLN
INTRAMUSCULAR | Status: AC
  Filled 2013-09-07: qty 30

## 2013-09-07 MED ORDER — ACETAMINOPHEN 650 MG RE SUPP: 650.0000 mg | RECTAL | Status: DC | PRN

## 2013-09-07 MED ORDER — GLYCOPYRROLATE 0.2 MG/ML IJ SOLN
INTRAMUSCULAR | Status: DC | PRN
  Administered 2013-09-07: .8 mg via INTRAVENOUS

## 2013-09-07 MED ORDER — SENNOSIDES-DOCUSATE SODIUM 8.6-50 MG PO TABS: 1.0000 | ORAL_TABLET | Freq: Every evening | ORAL | Status: DC | PRN

## 2013-09-07 MED ORDER — VITAMIN D (ERGOCALCIFEROL) 1.25 MG (50000 UNIT) PO CAPS
50000.0000 [IU] | ORAL_CAPSULE | ORAL | Status: DC
  Filled 2013-09-07: qty 1

## 2013-09-07 MED ORDER — THROMBIN 20000 UNITS EX SOLR
CUTANEOUS | Status: AC
  Filled 2013-09-07: qty 20000

## 2013-09-07 MED ORDER — ZOLPIDEM TARTRATE 5 MG PO TABS: 5.0000 mg | ORAL_TABLET | Freq: Every evening | ORAL | Status: DC | PRN

## 2013-09-07 MED ORDER — EPHEDRINE SULFATE 50 MG/ML IJ SOLN
INTRAMUSCULAR | Status: DC | PRN
  Administered 2013-09-07 (×2): 10 mg via INTRAVENOUS

## 2013-09-07 MED ORDER — METFORMIN HCL 500 MG PO TABS
1000.0000 mg | ORAL_TABLET | Freq: Two times a day (BID) | ORAL | Status: DC
  Administered 2013-09-07 – 2013-09-08 (×2): 1000 mg via ORAL
  Filled 2013-09-07 (×4): qty 2

## 2013-09-07 MED ORDER — ROCURONIUM BROMIDE 100 MG/10ML IV SOLN
INTRAVENOUS | Status: DC | PRN
  Administered 2013-09-07: 50 mg via INTRAVENOUS
  Administered 2013-09-07: 10 mg via INTRAVENOUS

## 2013-09-07 MED ORDER — NEOSTIGMINE METHYLSULFATE 1 MG/ML IJ SOLN
INTRAMUSCULAR | Status: DC | PRN
  Administered 2013-09-07: 5 mg via INTRAVENOUS

## 2013-09-07 MED ORDER — ONDANSETRON HCL 4 MG/2ML IJ SOLN: 4.0000 mg | Freq: Four times a day (QID) | INTRAMUSCULAR | Status: DC | PRN

## 2013-09-07 SURGICAL SUPPLY — 71 items
BENZOIN TINCTURE PRP APPL 2/3 (GAUZE/BANDAGES/DRESSINGS) ×2 IMPLANT
BIT DRILL NEURO 2X3.1 SFT TUCH (MISCELLANEOUS) ×1 IMPLANT
BIT DRILL SKYLINE 12MM (BIT) ×1 IMPLANT
BLADE SURG 15 STRL LF DISP TIS (BLADE) ×1 IMPLANT
BLADE SURG 15 STRL SS (BLADE) ×1
BLADE SURG ROTATE 9660 (MISCELLANEOUS) ×2 IMPLANT
BUR MATCHSTICK NEURO 3.0 LAGG (BURR) ×2 IMPLANT
CARTRIDGE OIL MAESTRO DRILL (MISCELLANEOUS) ×1 IMPLANT
CLOTH BEACON ORANGE TIMEOUT ST (SAFETY) ×2 IMPLANT
CLSR STERI-STRIP ANTIMIC 1/2X4 (GAUZE/BANDAGES/DRESSINGS) ×2 IMPLANT
CORDS BIPOLAR (ELECTRODE) ×2 IMPLANT
COVER SURGICAL LIGHT HANDLE (MISCELLANEOUS) ×2 IMPLANT
CRADLE DONUT ADULT HEAD (MISCELLANEOUS) ×2 IMPLANT
DEVICE ENDSKLTN MED 6 7MM (Orthopedic Implant) ×1 IMPLANT
DIFFUSER DRILL AIR PNEUMATIC (MISCELLANEOUS) ×2 IMPLANT
DRAIN JACKSON RD 7FR 3/32 (WOUND CARE) IMPLANT
DRAPE C-ARM 42X72 X-RAY (DRAPES) ×2 IMPLANT
DRAPE POUCH INSTRU U-SHP 10X18 (DRAPES) ×2 IMPLANT
DRAPE SURG 17X23 STRL (DRAPES) ×6 IMPLANT
DRILL BIT SKYLINE 12MM (BIT) ×1
DRILL NEURO 2X3.1 SOFT TOUCH (MISCELLANEOUS) ×2
DURAPREP 26ML APPLICATOR (WOUND CARE) ×2 IMPLANT
ELECT COATED BLADE 2.86 ST (ELECTRODE) ×2 IMPLANT
ELECT REM PT RETURN 9FT ADLT (ELECTROSURGICAL) ×2
ELECTRODE REM PT RTRN 9FT ADLT (ELECTROSURGICAL) ×1 IMPLANT
ENDOSKELETON MED 6 7MM (Orthopedic Implant) ×2 IMPLANT
EVACUATOR SILICONE 100CC (DRAIN) IMPLANT
GAUZE SPONGE 4X4 16PLY XRAY LF (GAUZE/BANDAGES/DRESSINGS) ×2 IMPLANT
GLOVE BIO SURGEON STRL SZ7 (GLOVE) ×2 IMPLANT
GLOVE BIO SURGEON STRL SZ8 (GLOVE) ×2 IMPLANT
GLOVE BIOGEL PI IND STRL 7.0 (GLOVE) ×2 IMPLANT
GLOVE BIOGEL PI IND STRL 8 (GLOVE) ×1 IMPLANT
GLOVE BIOGEL PI INDICATOR 7.0 (GLOVE) ×2
GLOVE BIOGEL PI INDICATOR 8 (GLOVE) ×1
GOWN SRG XL XLNG 56XLVL 4 (GOWN DISPOSABLE) ×1 IMPLANT
GOWN STRL NON-REIN LRG LVL3 (GOWN DISPOSABLE) ×2 IMPLANT
GOWN STRL NON-REIN XL XLG LVL4 (GOWN DISPOSABLE) ×1
IV CATH 14GX2 1/4 (CATHETERS) ×2 IMPLANT
KIT BASIN OR (CUSTOM PROCEDURE TRAY) ×2 IMPLANT
KIT ROOM TURNOVER OR (KITS) ×2 IMPLANT
MANIFOLD NEPTUNE II (INSTRUMENTS) ×2 IMPLANT
NEEDLE 27GAX1X1/2 (NEEDLE) ×2 IMPLANT
NEEDLE SPNL 20GX3.5 QUINCKE YW (NEEDLE) ×2 IMPLANT
NS IRRIG 1000ML POUR BTL (IV SOLUTION) ×2 IMPLANT
OIL CARTRIDGE MAESTRO DRILL (MISCELLANEOUS) ×2
PACK ORTHO CERVICAL (CUSTOM PROCEDURE TRAY) ×2 IMPLANT
PAD ARMBOARD 7.5X6 YLW CONV (MISCELLANEOUS) ×4 IMPLANT
PATTIES SURGICAL .5 X.5 (GAUZE/BANDAGES/DRESSINGS) IMPLANT
PATTIES SURGICAL .5 X1 (DISPOSABLE) ×2 IMPLANT
PIN DISTRACTION 14 (PIN) ×2 IMPLANT
PLATE ONE LEVEL SKYLINE 14MM (Plate) ×2 IMPLANT
PUTTY BONE DBX 2.5 MIS (Bone Implant) ×2 IMPLANT
SCREW VAR SELF TAP SKYLINE 14M (Screw) ×8 IMPLANT
SPONGE GAUZE 4X4 12PLY (GAUZE/BANDAGES/DRESSINGS) ×2 IMPLANT
SPONGE INTESTINAL PEANUT (DISPOSABLE) ×2 IMPLANT
SPONGE SURGIFOAM ABS GEL 100 (HEMOSTASIS) ×2 IMPLANT
STRIP CLOSURE SKIN 1/2X4 (GAUZE/BANDAGES/DRESSINGS) ×2 IMPLANT
SURGIFLO TRUKIT (HEMOSTASIS) IMPLANT
SUT MNCRL AB 4-0 PS2 18 (SUTURE) IMPLANT
SUT SILK 4 0 (SUTURE) ×1
SUT SILK 4-0 18XBRD TIE 12 (SUTURE) ×1 IMPLANT
SUT VIC AB 2-0 CT2 18 VCP726D (SUTURE) ×2 IMPLANT
SYR BULB IRRIGATION 50ML (SYRINGE) ×2 IMPLANT
SYR CONTROL 10ML LL (SYRINGE) ×4 IMPLANT
TAPE CLOTH 4X10 WHT NS (GAUZE/BANDAGES/DRESSINGS) ×2 IMPLANT
TAPE CLOTH SURG 4X10 WHT LF (GAUZE/BANDAGES/DRESSINGS) ×2 IMPLANT
TAPE UMBILICAL COTTON 1/8X30 (MISCELLANEOUS) ×2 IMPLANT
TOWEL OR 17X24 6PK STRL BLUE (TOWEL DISPOSABLE) ×2 IMPLANT
TOWEL OR 17X26 10 PK STRL BLUE (TOWEL DISPOSABLE) ×2 IMPLANT
WATER STERILE IRR 1000ML POUR (IV SOLUTION) ×2 IMPLANT
YANKAUER SUCT BULB TIP NO VENT (SUCTIONS) ×2 IMPLANT

## 2013-09-07 NOTE — Anesthesia Preprocedure Evaluation (Signed)
Anesthesia Evaluation  Patient identified by MRN, date of birth, ID band Patient awake    Reviewed: Allergy & Precautions, H&P , NPO status , Patient's Chart, lab work & pertinent test results  Airway Mallampati: II  Neck ROM: full    Dental   Pulmonary shortness of breath,          Cardiovascular hypertension,     Neuro/Psych  Headaches, Seizures -,  Seizures as a child.    GI/Hepatic   Endo/Other  diabetes, Type obesity  Renal/GU      Musculoskeletal   Abdominal   Peds  Hematology   Anesthesia Other Findings   Reproductive/Obstetrics                           Anesthesia Physical Anesthesia Plan  ASA: II  Anesthesia Plan: General   Post-op Pain Management:    Induction: Intravenous  Airway Management Planned: Oral ETT  Additional Equipment:   Intra-op Plan:   Post-operative Plan: Extubation in OR  Informed Consent: I have reviewed the patients History and Physical, chart, labs and discussed the procedure including the risks, benefits and alternatives for the proposed anesthesia with the patient or authorized representative who has indicated his/her understanding and acceptance.     Plan Discussed with: CRNA, Anesthesiologist and Surgeon  Anesthesia Plan Comments:         Anesthesia Quick Evaluation

## 2013-09-07 NOTE — H&P (Signed)
PREOPERATIVE H&P  Chief Complaint: right arm pain  HPI: Claudia [name] is a 47 y.o. female who presents with ongoing right arm. MRI reveals a right disc-osteophyte complex at C5/6. Pain is in the distribution of the right C6 nerve. Patient failed conservative care and wishes to proceed with a C5/6 ACDF.   Past Medical History  Diagnosis Date  . Diabetes mellitus   . Hypertension   . Hyperlipidemia   . Headache(784.0)   . Seizures     as child   Past Surgical History  Procedure Laterality Date  . Shoulder surgery Right 12    rotator cuff  . Laparoscopic assisted vaginal hysterectomy  1/14  . Umbilical hernia repair    . Tubal ligation     History   Social History  . Marital Status: Married    Spouse Name: N/A    Number of Children: N/A  . Years of Education: N/A   Social History Main Topics  . Smoking status: Never Smoker   . Smokeless tobacco: Never Used  . Alcohol Use: No  . Drug Use: No  . Sexual Activity: None   Other Topics Concern  . None   Social History Narrative  . None   History reviewed. No pertinent family history. Allergies  Allergen Reactions  . Hydrocodone Itching  . Other Hives, Swelling and Other (See Comments)    Vision changes; ALLERGIC TO ALL NUTS EXCEPT PEANUTS  . Oxycodone Itching  . Pravachol [Pravastatin Sodium] Itching    Joint pain   Prior to Admission medications   Medication Sig Start Date End Date Taking? Authorizing Provider  amLODipine (NORVASC) 10 MG tablet Take 10 mg by mouth daily.   Yes Historical Provider, MD  losartan (COZAAR) 100 MG tablet Take 100 mg by mouth daily.   Yes Historical Provider, MD  metFORMIN (GLUCOPHAGE) 1000 MG tablet Take 1,000 mg by mouth 2 (two) times daily with a meal.   Yes Historical Provider, MD  metoprolol (LOPRESSOR) 50 MG tablet Take 50 mg by mouth 2 (two) times daily.   Yes Historical Provider, MD  Omega-3 Fatty Acids (FISH OIL PO) Take 360 mg by mouth daily.   Yes Historical Provider, MD   rosuvastatin (CRESTOR) 5 MG tablet Take 5 mg by mouth daily.   Yes Historical Provider, MD  Vitamin D, Ergocalciferol, (DRISDOL) 50000 UNITS CAPS capsule Take 50,000 Units by mouth every 7 (seven) days. Mondays   Yes Historical Provider, MD     All other systems have been reviewed and were otherwise negative with the exception of those mentioned in the HPI and as above.  Physical Exam: Filed Vitals:   09/07/13 0935  BP: 178/95  Pulse: 78  Temp: 98.2 F (36.8 C)  Resp: 18    General: Alert, no acute distress Cardiovascular: No pedal edema Respiratory: No cyanosis, no use of accessory musculature GI: No organomegaly, abdomen is soft and non-tender Skin: No lesions in the area of chief complaint Neurologic: Sensation intact distally Psychiatric: Patient is competent for consent with normal mood and affect Lymphatic: No axillary or cervical lymphadenopathy  MUSCULOSKELETAL: + spurling's on right  Assessment/Plan: Right arm pain Plan for Procedure(s): ANTERIOR CERVICAL DECOMPRESSION/DISCECTOMY FUSION 1 LEVEL   Emilee Hero, MD 09/07/2013 2:37 PM

## 2013-09-07 NOTE — Transfer of Care (Signed)
Immediate Anesthesia Transfer of Care Note  Patient: Claudia [name]  Procedure(s) Performed: Procedure(s) with comments: ANTERIOR CERVICAL DECOMPRESSION/DISCECTOMY FUSION 1 LEVEL (N/A) - Anterior cervical decompression fusion, cervical 5-6 with instrumentation and allograft  Patient Location: PACU  Anesthesia Type:General  Level of Consciousness: awake  Airway & Oxygen Therapy: Patient Spontanous Breathing and Patient connected to face mask oxygen  Post-op Assessment: Report given to PACU RN and Post -op Vital signs reviewed and stable  Post vital signs: Reviewed and stable  Complications: No apparent anesthesia complications

## 2013-09-07 NOTE — Anesthesia Procedure Notes (Signed)
Procedure Name: Intubation Date/Time: 09/07/2013 12:39 PM Performed by: Orvilla Fus A Pre-anesthesia Checklist: Patient identified, Timeout performed, Emergency Drugs available, Suction available and Patient being monitored Patient Re-evaluated:Patient Re-evaluated prior to inductionOxygen Delivery Method: Circle system utilized Preoxygenation: Pre-oxygenation with 100% oxygen Intubation Type: IV induction Ventilation: Oral airway inserted - appropriate to patient size and Mask ventilation without difficulty Laryngoscope Size: Mac and 3 Grade View: Grade I Tube type: Oral Tube size: 7.0 mm Number of attempts: 1 Airway Equipment and Method: Stylet Placement Confirmation: ETT inserted through vocal cords under direct vision,  breath sounds checked- equal and bilateral and positive ETCO2 Secured at: 22 cm Tube secured with: Tape Dental Injury: Teeth and Oropharynx as per pre-operative assessment

## 2013-09-08 ENCOUNTER — Encounter (HOSPITAL_COMMUNITY): Payer: Self-pay | Admitting: Orthopedic Surgery

## 2013-09-08 LAB — GLUCOSE, CAPILLARY
Glucose-Capillary: 228 mg/dL — ABNORMAL HIGH (ref 70–99)
Glucose-Capillary: 211 mg/dL — ABNORMAL HIGH (ref 70–99)

## 2013-09-08 NOTE — Progress Notes (Signed)
Orthopedic Tech Progress Note Patient Details:  Claudia [name] 1966/01/26 [id number] [place] collar provided for use when showering Ortho Devices Type of Ortho Device: [place] cervical collar Ortho Device/Splint Interventions: Ordered   Asia R Thompson 09/08/2013, 2:09 PM

## 2013-09-08 NOTE — Progress Notes (Signed)
Orthopedic Tech Progress Note Patient Details:  Claudia [name] 1966-05-21 [id number] Spoke with patient's nurse; patient has soft collar already but requests the be provided with extra stockinet to cover outside of the collar.  Patient ID: Claudia [name], female   DOB: 1965-12-28, 47 y.o.   MRN: [id number]   Claudia [name] 09/08/2013, 10:17 AM

## 2013-09-08 NOTE — Progress Notes (Signed)
Utilization review completed.  

## 2013-09-08 NOTE — Anesthesia Postprocedure Evaluation (Signed)
  Anesthesia Post-op Note  Patient: Claudia [name]  Procedure(s) Performed: Procedure(s) with comments: ANTERIOR CERVICAL DECOMPRESSION/DISCECTOMY FUSION 1 LEVEL (N/A) - Anterior cervical decompression fusion, cervical 5-6 with instrumentation and allograft  Patient Location: Nursing Unit  Anesthesia Type:General  Level of Consciousness: awake, alert  and oriented  Airway and Oxygen Therapy: Patient Spontanous Breathing  Post-op Pain: none  Post-op Assessment: Post-op Vital signs reviewed, Patient's Cardiovascular Status Stable, Respiratory Function Stable, Patent Airway, No signs of Nausea or vomiting, Adequate PO intake and Pain level controlled  Post-op Vital Signs: Reviewed and stable  Complications: No apparent anesthesia complications

## 2013-09-08 NOTE — Op Note (Signed)
Claudia [name], Claudia [name] NO.:  [id number]  MEDICAL RECORD NO.:  [id number]  LOCATION:  5N09C                        FACILITY:  [facility]  PHYSICIAN:  Estill Bamberg, MD      DATE OF BIRTH:  09-29-1965  DATE OF PROCEDURE:  09/07/2013                              OPERATIVE REPORT   PREOPERATIVE DIAGNOSES: 1. Right-sided C6 radiculopathy. 2. Right-sided C5-6 disk herniation causing compression of the exiting     C6 nerve.  POSTOPERATIVE DIAGNOSES: 1. Right-sided C6 radiculopathy. 2. Right-sided C5-6 disk herniation causing compression of the exiting     C6 nerve.  PROCEDURES: 1. Anterior cervical decompression and fusion, C5-6. 2. Placement of anterior instrumentation, C5-6. 3. Insertion of interbody device x1 (7 mm lordotic medium interbody     spacer). 4. Use of morselized allograft. 5. Intraoperative use of fluoroscopy.  SURGEON:  Estill Bamberg, MD  ASSISTANT:  Jason Coop, PA-C  ANESTHESIA:  General endotracheal anesthesia.  COMPLICATIONS:  None.  DISPOSITION:  Stable.  ESTIMATED BLOOD LOSS:  Minimal.  INDICATIONS FOR PROCEDURE:  Briefly, Claudia [name] is a pleasant 47 year old female who did present to me with severe debilitating pain in her right arm.  At the time of my evaluation with her on August 07, 2013, she did note that her pain had been present for approximately 4 years.  The distribution of the pain was very much in the distribution of the C6 nerve on the right.  We did go forward with conservative care, but she did continue to have ongoing and severe pain.  We therefore discussed the risks and benefits regarding proceeding with a C5-6 ACDF with instrumentation.  The patient did fully understand the risks and limitations of the procedure as outlined in my preoperative note.  OPERATIVE DETAILS:  On September 07, 2013, the patient was brought to surgery and general endotracheal anesthesia was administered.  The patient was placed supine on  the hospital bed.  Given the patient's very large body habitus, a chin strap was used to retract adipose tissue surrounding the patient's neck.  Her arms were secured to her sides and the ulnar nerves were protected.  The neck was then prepped and draped in usual sterile fashion and a time-out procedure was performed.  I then made a transverse incision on the left side, in line with the C5-6 interspace.  The platysma was sharply incised.  The plane between the sternocleidomastoid muscle and the strap muscles was identified and explored.  The carotid artery was palpated and retracted laterally.  The anterior cervical spine was noted.  I did obtain a lateral intraoperative fluoroscopic view, to confirm the appropriate operative level.  I then placed a self-retaining Shadow-Line retractor.  Caspar pins were placed into the C5 and C6 vertebral bodies and distraction was applied.  I then used a 15-blade knife to perform an annulotomy anteriorly.  I did use a series of curettes to perform a thorough and complete diskectomy.  The posterior longitudinal ligament was identified and entered using a nerve hook.  I then used a #1 followed by #2 Kerrison to perform a thorough and complete neural foraminal decompression on both the right and the left sides.  The adequacy of the decompression was confirmed using a nerve hook.  I then prepared the endplates using a series of curettes.  I then placed a series of interbody spacers and I did feel that a 7 mm medium interbody spacer would be the most appropriate fit.  The interbody device was then packed with DBX mix and tamped into position in the usual fashion.  Caspar pins were then removed and bone wax was placed in their place.  I then placed a 14-mm anterior cervical plate.  A total of 2 vertebral body screws were placed in each vertebral body of C5 and C6 for a total of 4 screws. I did note an excellent press-fit of the screws.  The CAM  locking mechanism was utilized per the manufacturer's recommendations.  At this point, the wound was copiously irrigated.  I did use AP and lateral fluoroscopic images while placing the hardware, to confirm appropriate positioning of the hardware.  I then explored the wound for any undue bleeding and none was encountered.  I then closed the platysma using 2-0 Vicryl.  The skin was then closed using 3-0 Monocryl.  Benzoin and Steri- Strips were applied followed by sterile dressing.  All instrument counts were correct at the termination of the procedure.  Of note, Jason Coop was my assistant throughout the entirety of the procedure, and did aid in essential retraction and suctioning needed throughout the entirety of the surgery.     Estill Bamberg, MD     MD/MEDQ  D:  09/07/2013  T:  09/08/2013  Job:  [id number]  cc:   Tally Joe, M.D.

## 2013-09-08 NOTE — Progress Notes (Signed)
Orthopedic Tech Progress Note Patient Details:  Claudia [name] 08-07-1966 [id number] Went to deliver extra stockinet to patient and patient stated she would also like the additional collar ordered originally (with extra stockinet). Collar provided for patient to take home.  Ortho Devices Type of Ortho Device: Soft collar Ortho Device/Splint Interventions: Ordered   [place] R Thompson 09/08/2013, 1:44 PM

## 2013-09-08 NOTE — Progress Notes (Signed)
1 Day PO C5-6 ACDF for R arm pain. State R arm pain substantially improved, noted some ongoing mild discomfort last night but denies any arm pain at this time. C/O neck pain and mild swallowing difficulties as expected post op. She has been able to eat soft foods and drink this am. She states meds keep pain well controlled. Denies N/V/SOB  BP 175/100  Pulse 90  Temp(Src) 97.5 F (36.4 C) (Oral)  Resp 18  SpO2 98%  LMP 05/09/2012 Pt sitting up in bed eating breakfast and drinking comfortably. Soft collar in place and well positioned. 5/5 strength in BIL hands. NVI. Dressing C/D/I.   1 Day PO C5-6 ACDF for R C6 Radiculopathy, doing well  -Arm pain substantially improved, absent at time of eval  -Neck pain well controlled on oral meds percocet and valium   -D/C on same meds, scripts in chart   -Pt advised to take benadryl with percocet as causes itching  -Soft collar at all times 2weeks  -Can remove outer bendage and shower normally over steri strips in 5 days  -F/U in office 2weeks  -D/C home today after ambulation in hallway and eating lunch   -D/C instructions and signed scripts in chart

## 2013-09-18 ENCOUNTER — Other Ambulatory Visit: Payer: Self-pay

## 2013-09-18 DIAGNOSIS — Z1231 Encounter for screening mammogram for malignant neoplasm of breast: Secondary | ICD-10-CM

## 2013-09-25 ENCOUNTER — Encounter: Payer: Self-pay | Admitting: Cardiology

## 2013-09-27 NOTE — Discharge Summary (Signed)
Patient ID: Claudia [name] MRN: [id number] DOB/AGE: 04-14-1966 47 y.o.  Admit date: 09/07/2013 Discharge date: 09/08/2013  Admission Diagnoses:  Active Problems:   Radiculopathy   Discharge Diagnoses:  Same  Past Medical History  Diagnosis Date  . Diabetes mellitus   . Hypertension   . Hyperlipidemia   . Headache(784.0)   . Seizures     as child  . Hypercholesterolemia   . Fibroids   . Vitamin D deficiency   . Right knee pain   . Osteoarthritis     , with mild meniscus tear, followed by orthopedics--Dr Darrelyn Hillock 04/29/10    Surgeries: Procedure(s): ANTERIOR CERVICAL DECOMPRESSION/DISCECTOMY FUSION 1 LEVEL C5-6 on 09/07/2013   Discharged Condition: Improved  Hospital Course: Claudia [name] is an 47 y.o. female who was admitted 09/07/2013 for operative treatment of radiculopathy. Patient has severe unremitting pain that affects sleep, daily activities, and work/hobbies. After pre-op clearance the patient was taken to the operating room on 09/07/2013 and underwent  Procedure(s): ANTERIOR CERVICAL DECOMPRESSION/DISCECTOMY FUSION 1 LEVEL C5-6.    Patient was given perioperative antibiotics:  Anti-infectives   Start     Dose/Rate Route Frequency Ordered Stop   09/07/13 1700  ceFAZolin (ANCEF) IVPB 1 g/50 mL premix     1 g 100 mL/hr over 30 Minutes Intravenous Every 8 hours 09/07/13 1651 09/08/13 0127   09/07/13 0600  ceFAZolin (ANCEF) 3 g in dextrose 5 % 50 mL IVPB     3 g 160 mL/hr over 30 Minutes Intravenous On call to O.R. 09/06/13 1424 09/07/13 1241       Patient was given sequential compression devices, early ambulation to prevent DVT.  Patient benefited maximally from hospital stay and there were no complications.    Recent vital signs: BP 170/90  Pulse 90  Temp(Src) 97.5 F (36.4 C) (Oral)  Resp 18  SpO2 98%  LMP 05/09/2012  Discharge Medications:     Medication List    STOP taking these medications       aspirin EC 81 MG tablet      TAKE these  medications       amLODipine 10 MG tablet  Commonly known as:  NORVASC  Take 10 mg by mouth daily.     FISH OIL PO  Take 360 mg by mouth daily.     losartan 100 MG tablet  Commonly known as:  COZAAR  Take 100 mg by mouth daily.     metFORMIN 1000 MG tablet  Commonly known as:  GLUCOPHAGE  Take 1,000 mg by mouth 2 (two) times daily with a meal.     metoprolol 50 MG tablet  Commonly known as:  LOPRESSOR  Take 50 mg by mouth 2 (two) times daily.     rosuvastatin 5 MG tablet  Commonly known as:  CRESTOR  Take 5 mg by mouth daily.     Vitamin D (Ergocalciferol) 50000 UNITS Caps capsule  Commonly known as:  DRISDOL  Take 50,000 Units by mouth every 7 (seven) days. Mondays        Diagnostic Studies: Dg Chest 2 View  09/05/2013   CLINICAL DATA:  Preoperative evaluation for cervical surgery. Hypertension. Diabetes.  EXAM: CHEST  2 VIEW  COMPARISON:  Chest radiograph 06/01/2012.  FINDINGS: Stable enlarged cardiac and mediastinal contours. No consolidative pulmonary opacities. No pleural effusion or pneumothorax. Regional skeleton is unremarkable.  IMPRESSION: Cardiomegaly.  No acute cardiopulmonary process.   Electronically Signed   By: Annia Belt M.D.   On: 09/05/2013 12:41  Dg Cervical Spine 2-3 Views  09/07/2013   CLINICAL DATA:  Post C5-C6 ACDF  EXAM: DG C-ARM 1-60 MIN; CERVICAL SPINE - 2-3 VIEW  COMPARISON:  None  FLUOROSCOPY TIME:  10 seconds  FINDINGS: Two spot lateral projection radiographic images of the cervical spine are provided for review.  Images demonstrate the sequela of C5-C6 ACDF. Alignment appears near anatomic.  An endotracheal tube overlies the tracheal air column with tip excluded from view. Additional radiopaque surgical support apparatus is seen about the inferior aspect of the anterior neck. No definite radiopaque foreign body.  IMPRESSION: Post C5-C6 ACDF without definite evidence of complication.   Electronically Signed   By: Simonne Come M.D.   On: 09/07/2013  16:18   Dg C-arm 1-60 Min  09/07/2013   CLINICAL DATA:  Post C5-C6 ACDF  EXAM: DG C-ARM 1-60 MIN; CERVICAL SPINE - 2-3 VIEW  COMPARISON:  None  FLUOROSCOPY TIME:  10 seconds  FINDINGS: Two spot lateral projection radiographic images of the cervical spine are provided for review.  Images demonstrate the sequela of C5-C6 ACDF. Alignment appears near anatomic.  An endotracheal tube overlies the tracheal air column with tip excluded from view. Additional radiopaque surgical support apparatus is seen about the inferior aspect of the anterior neck. No definite radiopaque foreign body.  IMPRESSION: Post C5-C6 ACDF without definite evidence of complication.   Electronically Signed   By: Simonne Come M.D.   On: 09/07/2013 16:18    Disposition: 01-Home or Self Care       Future Appointments Provider Department Dept Phone   10/03/2013 11:30 AM Donato Schultz, MD [facility] [phone]   10/18/2013 11:50 AM [facility] [place]  IMAGING [phone]   Please wear two piece clothing and wear no powder or deodorant. Please arrive 15 minutes early prior to your appointment time.     1 Day PO C5-6 ACDF for R C6 Radiculopathy, doing well  -Arm pain substantially improved, absent at time of eval  -Neck pain well controlled on oral meds percocet and valium   -D/C on same meds, scripts in chart   -Pt advised to take benadryl with percocet as causes itching  -Soft collar at all times 2weeks  -Can remove outer bendage and shower normally over steri strips in 5 days  -F/U in office 2weeks  -D/C home today after ambulation in hallway and eating lunch  -D/C instructions and signed scripts in chart    Signed: Georga Bora 09/27/2013, 1:07 PM

## 2013-10-03 ENCOUNTER — Encounter: Payer: Self-pay | Admitting: Cardiology

## 2013-10-03 ENCOUNTER — Ambulatory Visit (INDEPENDENT_AMBULATORY_CARE_PROVIDER_SITE_OTHER): Payer: Managed Care, Other (non HMO) | Admitting: Cardiology

## 2013-10-03 VITALS — BP 136/80 | HR 76 | Ht 68.5 in | Wt 285.0 lb

## 2013-10-03 DIAGNOSIS — I10 Essential (primary) hypertension: Secondary | ICD-10-CM

## 2013-10-03 DIAGNOSIS — E119 Type 2 diabetes mellitus without complications: Secondary | ICD-10-CM

## 2013-10-03 NOTE — Patient Instructions (Signed)
Your physician recommends that you continue on your current medications as directed. Please refer to the Current Medication list given to you today.  Your physician wants you to follow-up in: 1 year with Dr. Skains. You will receive a reminder letter in the mail two months in advance. If you don't receive a letter, please call our office to schedule the follow-up appointment.  

## 2013-10-03 NOTE — Progress Notes (Signed)
[facility]. [address], [address], [place]  [address] Phone: [phone] Fax:  [phone]  Date:  10/03/2013   ID:  Claudia [name], DOB 1966/07/18, MRN [id number]  PCP:  Gara Kroner, MD   History of Present Illness: Claudia [name] is a 48 y.o. female with hypertension, diabetes, hyperlipidemia recent oncologic surgery here for six-month followup. At [facility] 7 months ago with HTN urgency. Was off BP meds at the time. Felt like tons of weight on chest and head. Got her back on meds. Improved. ECHO done. Concerned about cardiomegally.   Echocardiogram in the hospital setting showed EF 40-45% with moderate LVH.   She currently states that she feels much better, no significant shortness of breath anymore, only mild with exertion. Her shortness of breath was likely related to hypertensive urgency and her decreased ejection fraction also related to hypertension.   She did not have her medications over the weekend and her blood pressure was elevated. Now she has them, it has improved.   Wt Readings from Last 3 Encounters:  10/03/13 285 lb (129.275 kg)  09/05/13 292 lb (132.45 kg)  11/02/12 298 lb (135.172 kg)     Past Medical History  Diagnosis Date  . Diabetes mellitus   . Hypertension   . Hyperlipidemia   . Headache(784.0)   . Seizures     as child  . Hypercholesterolemia   . Fibroids   . Vitamin D deficiency   . Right knee pain   . Osteoarthritis     , with mild meniscus tear, followed by orthopedics--Dr Gladstone Lighter 04/29/10    Past Surgical History  Procedure Laterality Date  . Shoulder surgery Right 11    rotator cuff  . Laparoscopic assisted vaginal hysterectomy  1/14  . Umbilical hernia repair    . Tubal ligation    . Anterior cervical decomp/discectomy fusion N/A 09/07/2013    Procedure: ANTERIOR CERVICAL DECOMPRESSION/DISCECTOMY FUSION 1 LEVEL;  Surgeon: Sinclair Ship, MD;  Location: [facility];  Service: Orthopedics;  Laterality: N/A;  Anterior cervical  decompression fusion, cervical 5-6 with instrumentation and allograft    Current Outpatient Prescriptions  Medication Sig Dispense Refill  . amLODipine (NORVASC) 10 MG tablet Take 10 mg by mouth daily.      Marland Kitchen losartan (COZAAR) 100 MG tablet Take 100 mg by mouth daily.      . metFORMIN (GLUCOPHAGE) 1000 MG tablet Take 1,000 mg by mouth 2 (two) times daily with a meal.      . metoprolol (LOPRESSOR) 50 MG tablet Take 50 mg by mouth 2 (two) times daily.      . Omega-3 Fatty Acids (FISH OIL PO) Take 360 mg by mouth daily.      . rosuvastatin (CRESTOR) 5 MG tablet Take 5 mg by mouth daily.      . Vitamin D, Ergocalciferol, (DRISDOL) 50000 UNITS CAPS capsule Take 50,000 Units by mouth every 7 (seven) days. Mondays       No current facility-administered medications for this visit.    Allergies:    Allergies  Allergen Reactions  . Hydrocodone Itching  . Other Hives, Swelling and Other (See Comments)    Vision changes; ALLERGIC TO ALL NUTS EXCEPT PEANUTS  . Oxycodone Itching  . Pravachol [Pravastatin Sodium] Itching    Joint pain  . Simvastatin Hives    Social History:  The patient  reports that she has never smoked. She has never used smokeless tobacco. She reports that she does  not drink alcohol or use illicit drugs.   ROS:  Please see the history of present illness.   No strokelike symptoms, no fevers, no chills, no chest pain, no shortness of breath    PHYSICAL EXAM: VS:  BP 136/80  Pulse 76  Ht 5' 8.5" (1.74 m)  Wt 285 lb (129.275 kg)  BMI 42.70 kg/m2  LMP 05/09/2012 Well nourished, well developed, in no acute distress HEENT: normalC. collar in place after cervical spine surgery Neck: no JVD Cardiac:  normal S1, S2; RRR; no murmur Lungs:  clear to auscultation bilaterally, no wheezing, rhonchi or rales Abd: soft, nontender, no hepatomegaly Ext: no edema Skin: warm and dry Neuro: no focal abnormalities noted  EKG:  None today     ASSESSMENT AND  PLAN:  1. Hypertension-currently well controlled on these medications. No changes made. Expressed the importance of obtaining her medications. 2. Morbid obesity-continue to encourage weight loss. She's lost approximately 10 pounds since last year. 3. Diabetes-this will improve with weight loss.  Signed, Candee Furbish, MD [facility]  10/03/2013 12:06 PM

## 2013-10-18 ENCOUNTER — Ambulatory Visit
Admission: RE | Admit: 2013-10-18 | Discharge: 2013-10-18 | Disposition: A | Discharge status: Home / Self Care | Payer: Managed Care, Other (non HMO) | Source: Ambulatory Visit

## 2013-10-18 DIAGNOSIS — Z1231 Encounter for screening mammogram for malignant neoplasm of breast: Secondary | ICD-10-CM

## 2013-10-31 ENCOUNTER — Ambulatory Visit: Payer: Medicaid Other | Admitting: *Deleted

## 2013-12-18 ENCOUNTER — Encounter: Payer: Medicaid Other | Attending: Obstetrics and Gynecology | Admitting: *Deleted

## 2013-12-18 ENCOUNTER — Encounter: Payer: Self-pay | Admitting: *Deleted

## 2013-12-18 VITALS — Ht 68.0 in | Wt 295.2 lb

## 2013-12-18 DIAGNOSIS — Z713 Dietary counseling and surveillance: Secondary | ICD-10-CM | POA: Insufficient documentation

## 2013-12-18 DIAGNOSIS — E119 Type 2 diabetes mellitus without complications: Secondary | ICD-10-CM | POA: Insufficient documentation

## 2013-12-18 NOTE — Progress Notes (Signed)
Medical Nutrition Therapy:  Appt start time: 1000 end time:  1100.  Assessment:  Patient with a ~20 year history of diabetes. She reports going to classes for Gestational diabetes 17 years ago when she was pregnant with her youngest child. She is able to state that starchy foods raise blood glucose, but other knowledge is limited. Most recent A1c was 11 in November/December 2014. She is not currently checking her blood glucose due to lack of strips, but values generally ranging between 200-300s. She has started to eat healthier since January, limiting soda and eating more vegetables, baking meats, but still has high intake of sweets. She admits to a sweet tooth, sometimes eating 2 large candy bars in the evening. Physical activity is limited due to knee pain and recent disc surgery. However, she does try to walk, and wants to work up to walking 4 days weekly  MEDICATIONS: Metformin, for others, see list   DIETARY INTAKE:   Usual eating pattern includes 2-3 meals and 1 snacks per day.  24-hr recall:  B ( AM): Cereal (multigrain Cheerios, 1% milk), water  Snk ( AM): None  L ( PM): Sometimes skips, sandwich ([place], cheese, light mayo) OR [employer] 12 inch [place] sub (cheese), lemonade Snk ( PM): None D ( PM): Baked chicken wings, broccoli, baked beans, sweet potato (marshmellow, brown sugar), water with lemon/lime/cucumber Snk ( PM): 2 candy bar, cookies (not every day) Beverages: Water, lemonade, hot chocolate  Usual physical activity: Walking occasionally,   Estimated energy needs: 1800 calories 203 g carbohydrates 113 g protein 60 g fat  Progress Towards Goal(s):  In progress.   Nutritional Diagnosis:  NB-1.1 Food and nutrition-related knowledge deficit As related to type 2 diabetes.  As evidenced by limited prior education.    Intervention:  Nutrition counseling. We discussed basic carb counting, including foods with carbs, label reading, portion size, and meal planning.    Goals:   1. 3-4 carb servings per meal, 1 serving per snack. 2. Limit sweetened drinks to special occasions only. Instead, use flavored water, diet drinks 3. Limit evening snacking to 15 g carbs, choosing fruits, vegetables, popcorn more often. If participant wants something sweet, choose 1 mini size candy bar, 1-2 Hersey's kisses or 2 tbsp dark chocolate chips.  4. Monitor portion size of carbohydrate foods 5. Work up to walking 4 days weekly. Consider getting a [employer] and doing pool walking  Handouts given during visit include:  Living Well with Diabetes  Meal plan card  Monitoring/Evaluation:  Dietary intake, exercise, blood glucose, and body weight prn.

## 2014-02-21 ENCOUNTER — Encounter: Payer: Self-pay | Admitting: Cardiology

## 2014-02-21 ENCOUNTER — Ambulatory Visit (INDEPENDENT_AMBULATORY_CARE_PROVIDER_SITE_OTHER): Payer: Medicaid Other | Admitting: Cardiology

## 2014-02-21 VITALS — BP 157/102 | HR 89 | Wt 289.0 lb

## 2014-02-21 DIAGNOSIS — I10 Essential (primary) hypertension: Secondary | ICD-10-CM

## 2014-02-21 MED ORDER — ROSUVASTATIN CALCIUM 10 MG PO TABS: 10.0000 mg | ORAL_TABLET | ORAL | Status: DC

## 2014-02-21 MED ORDER — SPIRONOLACTONE 25 MG PO TABS: 25.0000 mg | ORAL_TABLET | Freq: Every day | ORAL | Status: DC

## 2014-02-21 MED ORDER — CARVEDILOL 25 MG PO TABS: 25.0000 mg | ORAL_TABLET | Freq: Two times a day (BID) | ORAL | Status: DC

## 2014-02-21 NOTE — Patient Instructions (Signed)
Your physician has recommended you make the following change in your medication:  1) STOP Metoprolol 2) START Carvedilol 25mg  twice daily. An Rx has been sent to your pharmacy 3) START Spironolactone 25mg  daily. An Rx has been sent to your pharmacy  Your physician recommends that you schedule a follow-up appointment in: 2-3 weeks with the NP or PA  Your physician recommends that you return for lab work in: 2-3 weeks (same day as f/u appt)

## 2014-02-21 NOTE — Progress Notes (Signed)
[facility]. [address], [address], [place]  [address] Phone: [phone] Fax:  [phone]  Date:  02/21/2014   ID:  Claudia [name], DOB [birth year], MRN [id number]  PCP:  Gara Kroner, MD   History of Present Illness: Claudia [name] is a 48 y.o. female with hypertension, diabetes, hyperlipidemia recent oncologic surgery here for six-month followup. At [facility] hospital 2014 with HTN urgency. Was off BP meds at the time. Felt like tons of weight on chest and head. Got her back on meds. Improved. ECHO done. Concerned about cardiomegally.   Echocardiogram in the hospital setting showed EF 40-45% with moderate LVH.   She currently states that she feels much better, no significant shortness of breath anymore, only mild with exertion. Her shortness of breath was likely related to hypertensive urgency and her decreased ejection fraction also related to hypertension.   I have reviewed Dr. Moreen Fowler note. In regards to statin intolerance, He does state in his note that there was a trial of Crestor once weekly however she reports that she did not take it as such. Hemoglobin A1c 10.7.   Wt Readings from Last 3 Encounters:  02/21/14 289 lb (131.09 kg)  12/18/13 295 lb 3.2 oz (133.902 kg)  10/03/13 285 lb (129.275 kg)     Past Medical History  Diagnosis Date  . Diabetes mellitus   . Hypertension   . Hyperlipidemia   . Headache(784.0)   . Seizures     as child  . Hypercholesterolemia   . Fibroids   . Vitamin D deficiency   . Right knee pain   . Osteoarthritis     , with mild meniscus tear, followed by orthopedics--Dr Gladstone Lighter 04/29/10    Past Surgical History  Procedure Laterality Date  . Shoulder surgery Right 11    rotator cuff  . Laparoscopic assisted vaginal hysterectomy  1/14  . Umbilical hernia repair    . Tubal ligation    . Anterior cervical decomp/discectomy fusion N/A 09/07/2013    Procedure: ANTERIOR CERVICAL DECOMPRESSION/DISCECTOMY FUSION 1 LEVEL;  Surgeon: Sinclair Ship, MD;  Location: [facility];  Service: Orthopedics;  Laterality: N/A;  Anterior cervical decompression fusion, cervical 5-6 with instrumentation and allograft    Current Outpatient Prescriptions  Medication Sig Dispense Refill  . amLODipine (NORVASC) 10 MG tablet Take 10 mg by mouth daily.      . Canagliflozin (INVOKANA) 100 MG TABS Take 1 tablet by mouth daily.      Marland Kitchen linagliptin (TRADJENTA) 5 MG TABS tablet Take 5 mg by mouth daily.      Marland Kitchen losartan (COZAAR) 100 MG tablet Take 100 mg by mouth daily.      . metFORMIN (GLUCOPHAGE) 1000 MG tablet Take 1,000 mg by mouth 2 (two) times daily with a meal.      . metoprolol (LOPRESSOR) 50 MG tablet Take 50 mg by mouth 2 (two) times daily.      . Omega-3 Fatty Acids (FISH OIL PO) Take 360 mg by mouth daily.      . Vitamin D, Ergocalciferol, (DRISDOL) 50000 UNITS CAPS capsule Take 50,000 Units by mouth every 7 (seven) days. Mondays       No current facility-administered medications for this visit.    Allergies:    Allergies  Allergen Reactions  . Hydrocodone Itching  . Other Hives, Swelling and Other (See Comments)    Vision changes; ALLERGIC TO ALL NUTS EXCEPT PEANUTS  . Oxycodone Itching  . Pravachol [Pravastatin Sodium]  Itching    Joint pain  . Simvastatin Hives    Social History:  The patient  reports that she has never smoked. She has never used smokeless tobacco. She reports that she does not drink alcohol or use illicit drugs.   ROS:  Please see the history of present illness.   No strokelike symptoms, no fevers, no chills, no chest pain, no shortness of breath    PHYSICAL EXAM: VS:  BP 157/102  Pulse 89  Wt 289 lb (131.09 kg)  LMP 05/09/2012 Well nourished, well developed, in no acute distress HEENT: normal Neck: no JVD Cardiac:  normal S1, S2; RRR; no murmur Lungs:  clear to auscultation bilaterally, no wheezing, rhonchi or rales Abd: soft, nontender, no hepatomegalyobese Ext: no edema Skin: warm and dry Neuro: no  focal abnormalities noted  EKG:  None today     ASSESSMENT AND PLAN:  1. Hypertension-uncontrolled. She states that she is taking her medications. Expressed the importance of obtaining her medications. I have added spironolactone 25 mg once a day. I have changed her metoprolol over to carvedilol 25 mg twice a day. Occasionally, the addition of spironolactone will help out significantly with uncontrolled or difficult to control hypertension. Will check BMET in 2 weeks and visit in one month.  2. Morbid obesity-continue to encourage weight loss. This can significantly help with both her diabetes as well as hypertension.  3. Diabetes-this will improve with weight loss. 4. Statin intolerance-I will try Crestor 10 mg once weekly. She has had prior myalgias/joint discomfort on once daily Crestor, pravastatin as well. Consider [employer], Pharm.D. if necessary in the future.  Signed, Candee Furbish, MD [facility]  02/21/2014 4:19 PM

## 2014-03-16 ENCOUNTER — Other Ambulatory Visit: Payer: Managed Care, Other (non HMO)

## 2014-03-16 ENCOUNTER — Encounter: Payer: Self-pay | Admitting: Physician Assistant

## 2014-03-16 ENCOUNTER — Ambulatory Visit (INDEPENDENT_AMBULATORY_CARE_PROVIDER_SITE_OTHER): Payer: Medicaid Other | Admitting: Physician Assistant

## 2014-03-16 VITALS — BP 164/108 | HR 80 | Ht 68.0 in | Wt 289.0 lb

## 2014-03-16 DIAGNOSIS — I429 Cardiomyopathy, unspecified: Secondary | ICD-10-CM | POA: Insufficient documentation

## 2014-03-16 DIAGNOSIS — I10 Essential (primary) hypertension: Secondary | ICD-10-CM

## 2014-03-16 DIAGNOSIS — I428 Other cardiomyopathies: Secondary | ICD-10-CM

## 2014-03-16 DIAGNOSIS — E785 Hyperlipidemia, unspecified: Secondary | ICD-10-CM

## 2014-03-16 LAB — BASIC METABOLIC PANEL
BUN: 7 mg/dL (ref 6–23)
CALCIUM: 8.9 mg/dL (ref 8.4–10.5)
CO2: 26 mEq/L (ref 19–32)
CREATININE: 0.6 mg/dL (ref 0.4–1.2)
Chloride: 104 mEq/L (ref 96–112)
GFR: 143.59 mL/min (ref >60.00)
GLUCOSE: 165 mg/dL — AB (ref 70–99)
Potassium: 3.5 mEq/L (ref 3.5–5.1)
SODIUM: 138 meq/L (ref 135–145)

## 2014-03-16 MED ORDER — CARVEDILOL 25 MG PO TABS: 37.5000 mg | ORAL_TABLET | Freq: Two times a day (BID) | ORAL | Status: DC

## 2014-03-16 NOTE — Progress Notes (Signed)
Cardiology Office Note    Date:  03/16/2014   ID:  Claudia [name], DOB 14-Mar-1966, MRN [id number]  PCP:  Claudia Kroner, MD  Cardiologist:  Dr. Candee [name]      History of Present Illness: Claudia [name] is a 48 y.o. female the history of HTN, diabetes, HL, cardiomyopathy. Echocardiogram in 2013 demonstrated an EF of 40-45% with moderate LVH. This was done in the setting of hypertensive urgency. Recently seen in follow up by Dr. Marlou [name] 02/21/14. Blood pressure was out of control. Medications were adjusted. She returns for follow up.  She admits to compliance with her medications. She probably has too much salt in her diet. She describes symptoms of dizziness and pressure in her eyes as well as blurry vision and palpitations when her blood pressure is elevated.  She also notes nausea. She has been checking her blood pressure at home. It has remained high since starting her new medications. She denies chest discomfort. She notes some dyspnea with exertion. She is NYHA 2-2b. She denies orthopnea, PND or edema. She denies syncope.   Studies:  - Echo (06/02/12):  Mod LVH, EF 40-45%, diff HK, Gr 1 DD, mild LAE   Recent Labs: 09/05/2013: ALT 33; Creatinine 0.53; Hemoglobin 12.9; Potassium 3.9   Wt Readings from Last 3 Encounters:  03/16/14 289 lb (131.09 kg)  02/21/14 289 lb (131.09 kg)  12/18/13 295 lb 3.2 oz (133.902 kg)     Past Medical History  Diagnosis Date  . Diabetes mellitus   . Hypertension   . Hyperlipidemia   . Headache(784.0)   . Seizures     as child  . Hypercholesterolemia   . Fibroids   . Vitamin D deficiency   . Right knee pain   . Osteoarthritis     , with mild meniscus tear, followed by orthopedics--Dr Claudia [name] 04/29/10    Current Outpatient Prescriptions  Medication Sig Dispense Refill  . amLODipine (NORVASC) 10 MG tablet Take 10 mg by mouth daily.      . Canagliflozin (INVOKANA) 100 MG TABS Take 1 tablet by mouth daily.      . carvedilol (COREG) 25 MG tablet  Take 1 tablet (25 mg total) by mouth 2 (two) times daily.  60 tablet  11  . linagliptin (TRADJENTA) 5 MG TABS tablet Take 5 mg by mouth daily.      Marland Kitchen losartan (COZAAR) 100 MG tablet Take 100 mg by mouth daily.      . metFORMIN (GLUCOPHAGE) 1000 MG tablet Take 1,000 mg by mouth 2 (two) times daily with a meal.      . Omega-3 Fatty Acids (FISH OIL PO) Take 360 mg by mouth daily.      . rosuvastatin (CRESTOR) 10 MG tablet Take 1 tablet (10 mg total) by mouth once a week.      . spironolactone (ALDACTONE) 25 MG tablet Take 1 tablet (25 mg total) by mouth daily.  30 tablet  11  . Vitamin D, Ergocalciferol, (DRISDOL) 50000 UNITS CAPS capsule Take 50,000 Units by mouth every 7 (seven) days. Mondays       No current facility-administered medications for this visit.    Allergies:   Hydrocodone; Other; Oxycodone; Pravachol; and Simvastatin   Social History:  The patient  reports that she has never smoked. She has never used smokeless tobacco. She reports that she does not drink alcohol or use illicit drugs.   Family History:  The patient's family history includes CVA in her maternal grandmother; Diabetes in  her maternal grandmother and mother; Hypercholesterolemia in her mother; Hypertension in her maternal grandmother, mother, and sister.   ROS:  Please see the history of present illness.      All other systems reviewed and negative.   PHYSICAL EXAM: VS:  BP 164/108  Pulse 80  Ht 5\' 8"  (1.727 m)  Wt 289 lb (131.09 kg)  BMI 43.95 kg/m2  LMP 05/09/2012 Well nourished, well developed, in no acute distress HEENT: normal Neck: I cannot assess JVD Cardiac:  normal S1, S2; RRR; no murmur Lungs:  clear to auscultation bilaterally, no wheezing, rhonchi or rales Abd: soft, nontender, no hepatomegaly Ext: no edema Skin: warm and dry Neuro:  CNs 2-12 intact, no focal abnormalities noted   EKG:  NSR, HR 80, rightward axis, QTc 482, no significant change since 08/05/13    ASSESSMENT AND  PLAN:  1. Essential hypertension:  Blood pressure remains uncontrolled. She admits to compliance with medications. I have given her instructions for low-salt diet. We also discussed the importance of weight loss. She is > 85 kg. We can increase her Coreg to a maximum of 50 mg twice a day. For now, I will increase her Coreg to 37.5 mg twice a day. Check a basic metabolic panel today. Consider increasing spironolactone to 50 mg daily if her renal function and potassium remain stable.  Consider renal arterial Dopplers. However, given her body habitus, I am not certain that we will get good images. 2. Cardiomyopathy:  Likely related to hypertension. Continue ARB, beta blocker, spironolactone. Consider adding hydralazine if blood pressure remains difficult to control. 3. Hyperlipidemia:  She remains on Crestor. 4. Disposition: Follow up with me or Dr. Marlou [name] in 3 weeks.   Signed, Claudia [name], MHS 03/16/2014 9:52 AM    [facility] [place], [place], [place]  [address] Phone: [phone]; Fax: [phone]

## 2014-03-16 NOTE — Patient Instructions (Addendum)
INCREASE COREG TO 37.5 MG TWICE DAILY (THIS WILL BE 1 AND 1/2 TABS TWICE DAILY); NEW RX SENT IN TODAY  PLEASE FOLLOW UP DR. SKAINS IN 3 WEEKS OR SCOTT WEAVER, PAC SAME DAY 04/12/14 @ 8:30   Low-Sodium Eating Plan Sodium raises blood pressure and causes water to be held in the body. Getting less sodium from food will help lower your blood pressure, reduce any swelling, and protect your heart, liver, and kidneys. We get sodium by adding salt (sodium chloride) to food. Most of our sodium comes from canned, boxed, and frozen foods. Restaurant foods, fast foods, and pizza are also very high in sodium. Even if you take medicine to lower your blood pressure or to reduce fluid in your body, getting less sodium from your food is important. WHAT IS MY PLAN? Most people should limit their sodium intake to 2,300 mg a day. Your health care provider recommends that you limit your sodium intake to 2000 mg (2 grams) a day.  WHAT DO I NEED TO KNOW ABOUT THIS EATING PLAN? For the low-sodium eating plan, you will follow these general guidelines:  Choose foods with a % Daily Value for sodium of less than 5% (as listed on the food label).   Use salt-free seasonings or herbs instead of table salt or sea salt.   Check with your health care provider or pharmacist before using salt substitutes.   Eat fresh foods.  Eat more vegetables and fruits.  Limit canned vegetables. If you do use them, rinse them well to decrease the sodium.   Limit cheese to 1 oz (28 g) per day.   Eat lower-sodium products, often labeled as "lower sodium" or "no salt added."  Avoid foods that contain monosodium glutamate (MSG). MSG is sometimes added to [place] food and some canned foods.  Check food labels (Nutrition Facts labels) on foods to learn how much sodium is in one serving.  Eat more home-cooked food and less restaurant, buffet, and fast food.  When eating at a restaurant, ask that your food be prepared with less salt  or none, if possible.  HOW DO I READ FOOD LABELS FOR SODIUM INFORMATION? The Nutrition Facts label lists the amount of sodium in one serving of the food. If you eat more than one serving, you must multiply the listed amount of sodium by the number of servings. Food labels may also identify foods as:  Sodium free--Less than 5 mg in a serving.  Very low sodium--35 mg or less in a serving.  Low sodium--140 mg or less in a serving.  Light in sodium--50% less sodium in a serving. For example, if a food that usually has 300 mg of sodium is changed to become light in sodium, it will have 150 mg of sodium.  Reduced sodium--25% less sodium in a serving. For example, if a food that usually has 400 mg of sodium is changed to reduced sodium, it will have 300 mg of sodium. WHAT FOODS CAN I EAT? Grains Low-sodium cereals, including oats, puffed wheat and rice, and shredded wheat cereals. Low-sodium crackers. Unsalted rice and pasta. Lower-sodium bread.  Vegetables Frozen or fresh vegetables. Low-sodium or reduced-sodium canned vegetables. Low-sodium or reduced-sodium tomato sauce and paste. Low-sodium or reduced-sodium tomato and vegetable juices.  Fruits Fresh, frozen, and canned fruit. Fruit juice.  Meat and Other Protein Products Low-sodium canned tuna and salmon. Fresh or frozen meat, poultry, seafood, and fish. Lamb. Unsalted nuts. Dried beans, peas, and lentils without added salt. Unsalted canned  beans. Homemade soups without salt. Eggs.  Dairy Milk. Soy milk. Ricotta cheese. Low-sodium or reduced-sodium cheeses. Yogurt.  Condiments Fresh and dried herbs and spices. Salt-free seasonings. Onion and garlic powders. Low-sodium varieties of mustard and ketchup. Lemon juice.  Fats and Oils Reduced-sodium salad dressings. Unsalted butter.  Other Unsalted popcorn and pretzels.  The items listed above may not be a complete list of recommended foods or beverages. Contact your dietitian for  more options. WHAT FOODS ARE NOT RECOMMENDED? Grains Instant hot cereals. Bread stuffing, pancake, and biscuit mixes. Croutons. Seasoned rice or pasta mixes. Noodle soup cups. Boxed or frozen macaroni and cheese. Self-rising flour. Regular salted crackers. Vegetables Regular canned vegetables. Regular canned tomato sauce and paste. Regular tomato and vegetable juices. Frozen vegetables in sauces. Salted french fries. Olives. Angie Fava. Relishes. Sauerkraut. Salsa. Meat and Other Protein Products Salted, canned, smoked, spiced, or pickled meats, seafood, or fish. Bacon, ham, sausage, hot dogs, corned beef, chipped beef, and packaged luncheon meats. Salt pork. Jerky. Pickled herring. Anchovies, regular canned tuna, and sardines. Salted nuts. Dairy Processed cheese and cheese spreads. Cheese curds. Blue cheese and cottage cheese. Buttermilk.  Condiments Onion and garlic salt, seasoned salt, table salt, and sea salt. Canned and packaged gravies. Worcestershire sauce. Tartar sauce. Barbecue sauce. Teriyaki sauce. Soy sauce, including reduced sodium. Steak sauce. Fish sauce. Oyster sauce. Cocktail sauce. Horseradish. Regular ketchup and mustard. Meat flavorings and tenderizers. Bouillon cubes. Hot sauce. Tabasco sauce. Marinades. Taco seasonings. Relishes. Fats and Oils Regular salad dressings. Salted butter. Margarine. Ghee. Bacon fat.  Other Potato and tortilla chips. Corn chips and puffs. Salted popcorn and pretzels. Canned or dried soups. Pizza. Frozen entrees and pot pies.  The items listed above may not be a complete list of foods and beverages to avoid. Contact your dietitian for more information. Document Released: 03/06/2002 Document Revised: 09/19/2013 Document Reviewed: 07/19/2013 [facility] Patient Information 2015 [place], [place]. This information is not intended to replace advice given to you by your health care provider. Make sure you discuss any questions you have with your health  care provider.

## 2014-04-12 ENCOUNTER — Ambulatory Visit: Payer: Medicaid Other | Admitting: Physician Assistant

## 2014-05-03 ENCOUNTER — Ambulatory Visit (INDEPENDENT_AMBULATORY_CARE_PROVIDER_SITE_OTHER): Payer: Managed Care, Other (non HMO) | Admitting: Physician Assistant

## 2014-05-03 ENCOUNTER — Encounter: Payer: Self-pay | Admitting: Physician Assistant

## 2014-05-03 VITALS — BP 150/100 | HR 88 | Ht 68.0 in | Wt 288.0 lb

## 2014-05-03 DIAGNOSIS — I429 Cardiomyopathy, unspecified: Secondary | ICD-10-CM

## 2014-05-03 DIAGNOSIS — E785 Hyperlipidemia, unspecified: Secondary | ICD-10-CM

## 2014-05-03 DIAGNOSIS — I428 Other cardiomyopathies: Secondary | ICD-10-CM

## 2014-05-03 DIAGNOSIS — I10 Essential (primary) hypertension: Secondary | ICD-10-CM

## 2014-05-03 MED ORDER — HYDRALAZINE HCL 25 MG PO TABS: 25.0000 mg | ORAL_TABLET | Freq: Three times a day (TID) | ORAL | Status: DC

## 2014-05-03 NOTE — Patient Instructions (Signed)
START HYDRALAZINE 25 MG; 1 TABLET 3 TIMES DAILY; RX SENT IN  Your physician recommends that you schedule a follow-up appointment ON 05/28/14 11:30 AM WITH [place], PAC

## 2014-05-03 NOTE — Progress Notes (Signed)
Cardiology Office Note    Date:  05/03/2014   ID:  Claudia [name], DOB Mar 30, 1966, MRN [id number]  PCP:  Gara Kroner, MD  Cardiologist:  Dr. Candee Furbish      History of Present Illness: Claudia [name] is a 48 y.o. female with a hx of HTN, diabetes, HL, cardiomyopathy. Echo in 2013 demonstrated an EF of 40-45% with moderate LVH. This was done in the setting of hypertensive urgency. Saw Dr. Marlou Porch 02/21/14 and BP was out of control. Medications were adjusted.  I saw her in follow up 03/16/14. Blood pressure remained out of control. We discussed low-salt diet. I increased her carvedilol. She returns for follow up.  The patient denies any chest pain, significant dyspnea, syncope, orthopnea, PND, edema.   Studies:  - Echo (06/02/12):  Mod LVH, EF 40-45%, diff HK, Gr 1 DD, mild LAE   Recent Labs: 09/05/2013: ALT 33; Hemoglobin 12.9  03/16/2014: Creatinine 0.6; Potassium 3.5   Wt Readings from Last 3 Encounters:  03/16/14 289 lb (131.09 kg)  02/21/14 289 lb (131.09 kg)  12/18/13 295 lb 3.2 oz (133.902 kg)     Past Medical History  Diagnosis Date  . Diabetes mellitus   . Hypertension   . Hyperlipidemia   . Headache(784.0)   . Seizures     as child  . Hypercholesterolemia   . Fibroids   . Vitamin D deficiency   . Right knee pain   . Osteoarthritis     , with mild meniscus tear, followed by orthopedics--Dr Gladstone Lighter 04/29/10    Current Outpatient Prescriptions  Medication Sig Dispense Refill  . amLODipine (NORVASC) 10 MG tablet Take 10 mg by mouth daily.      . Canagliflozin (INVOKANA) 100 MG TABS Take 1 tablet by mouth daily.      . carvedilol (COREG) 25 MG tablet Take 1.5 tablets (37.5 mg total) by mouth 2 (two) times daily with a meal.  90 tablet  11  . linagliptin (TRADJENTA) 5 MG TABS tablet Take 5 mg by mouth daily.      Marland Kitchen losartan (COZAAR) 100 MG tablet Take 100 mg by mouth daily.      . metFORMIN (GLUCOPHAGE) 1000 MG tablet Take 1,000 mg by mouth 2 (two) times daily with  a meal.      . metoprolol succinate (TOPROL-XL) 50 MG 24 hr tablet Take 50 mg by mouth daily. Take with or immediately following a meal.      . Omega-3 Fatty Acids (FISH OIL PO) Take 360 mg by mouth daily.      . rosuvastatin (CRESTOR) 10 MG tablet Take 1 tablet (10 mg total) by mouth once a week.      . spironolactone (ALDACTONE) 25 MG tablet Take 1 tablet (25 mg total) by mouth daily.  30 tablet  11  . Vitamin D, Ergocalciferol, (DRISDOL) 50000 UNITS CAPS capsule Take 50,000 Units by mouth every 7 (seven) days. Mondays       No current facility-administered medications for this visit.    Allergies:   Crestor; Hydrocodone; Other; Oxycodone; Pravachol; and Simvastatin   Social History:  The patient  reports that she has never smoked. She has never used smokeless tobacco. She reports that she does not drink alcohol or use illicit drugs.   Family History:  The patient's family history includes CVA in her maternal grandmother; Diabetes in her maternal grandmother and mother; Hypercholesterolemia in her mother; Hypertension in her maternal grandmother, mother, and sister.   ROS:  Please  see the history of present illness.  She has a right cervical strain and has had some significant pain. This is a work-related injury.    All other systems reviewed and negative.   PHYSICAL EXAM: VS:  BP 150/100  Pulse 88  Ht 5\' 8"  (1.727 m)  Wt 288 lb (130.636 kg)  BMI 43.80 kg/m2  LMP 05/09/2012 Well nourished, well developed, in no acute distress HEENT: normal Neck: I cannot assess JVD Cardiac:  normal S1, S2; RRR; no murmur Lungs:  clear to auscultation bilaterally, no wheezing, rhonchi or rales Abd: soft, nontender, no hepatomegaly Ext: no edema Skin: warm and dry Neuro:  CNs 2-12 intact, no focal abnormalities noted   ASSESSMENT AND PLAN:  Essential hypertension:  Blood pressure is uncontrolled. She did have a high salt meal last night. Start hydralazine 25 mg 3 times a day.  Cardiomyopathy:    Continue beta blocker, ARB, spironolactone. Add  hydralazine as noted.  Consider repeating echocardiogram once blood pressure controlled.  Hyperlipidemia:  She remains on Crestor.  Disposition:  Follow up with me in 4-6 weeks.   Signed, Versie Starks, MHS 05/03/2014 9:03 AM    [facility] [place], [place], [place]  [address] Phone: [phone]; Fax: [phone]

## 2014-05-28 ENCOUNTER — Ambulatory Visit: Payer: Managed Care, Other (non HMO) | Admitting: Physician Assistant

## 2014-06-06 ENCOUNTER — Ambulatory Visit (INDEPENDENT_AMBULATORY_CARE_PROVIDER_SITE_OTHER): Payer: Managed Care, Other (non HMO) | Admitting: Physician Assistant

## 2014-06-06 ENCOUNTER — Encounter: Payer: Self-pay | Admitting: Physician Assistant

## 2014-06-06 VITALS — BP 130/90 | HR 84 | Ht 68.0 in | Wt 286.0 lb

## 2014-06-06 DIAGNOSIS — E785 Hyperlipidemia, unspecified: Secondary | ICD-10-CM

## 2014-06-06 DIAGNOSIS — I429 Cardiomyopathy, unspecified: Secondary | ICD-10-CM

## 2014-06-06 DIAGNOSIS — I428 Other cardiomyopathies: Secondary | ICD-10-CM

## 2014-06-06 DIAGNOSIS — I10 Essential (primary) hypertension: Secondary | ICD-10-CM

## 2014-06-06 NOTE — Progress Notes (Signed)
Cardiology Office Note    Date:  06/06/2014   ID:  Claudia [name], DOB December 04, 1965, MRN [id number]  PCP:  Gara Kroner, MD  Cardiologist:  Dr. Candee Furbish      History of Present Illness: Claudia [name] is a 48 y.o. female with a hx of HTN, diabetes, HL, cardiomyopathy. Echo in 2013 demonstrated an EF of 40-45% with moderate LVH. This was done in the setting of hypertensive urgency. Saw Dr. Marlou Porch 02/21/14 and BP was out of control. Medications were adjusted.  I have seen her recently in follow up.  At last visit 05/03/14, I added Hydralazine.  She returns for FU.  She is doing well.  She has been dieting and has lost ~ 10 lbs.  She has been walking every day as well.  BPs from home are improved.  The majority of readings are at target.  The patient denies chest pain, shortness of breath, syncope, orthopnea, PND or significant pedal edema.   Studies:  - Echo (06/02/12):  Mod LVH, EF 40-45%, diff HK, Gr 1 DD, mild LAE   Recent Labs: 09/05/2013: ALT 33; Hemoglobin 12.9  03/16/2014: Creatinine 0.6; Potassium 3.5   Wt Readings from Last 3 Encounters:  06/06/14 286 lb (129.729 kg)  05/03/14 288 lb (130.636 kg)  03/16/14 289 lb (131.09 kg)     Past Medical History  Diagnosis Date  . Diabetes mellitus   . Hypertension   . Hyperlipidemia   . Headache(784.0)   . Seizures     as child  . Hypercholesterolemia   . Fibroids   . Vitamin D deficiency   . Right knee pain   . Osteoarthritis     , with mild meniscus tear, followed by orthopedics--Dr Gladstone Lighter 04/29/10    Current Outpatient Prescriptions  Medication Sig Dispense Refill  . amLODipine (NORVASC) 10 MG tablet Take 10 mg by mouth daily.      . Canagliflozin (INVOKANA) 100 MG TABS Take 1 tablet by mouth daily.      . carvedilol (COREG) 25 MG tablet Take 1.5 tablets (37.5 mg total) by mouth 2 (two) times daily with a meal.  90 tablet  11  . chlorhexidine (PERIDEX) 0.12 % solution Use as directed 0.12 mLs in the mouth or throat 2 (two)  times daily. Dentist mouth wash      . hydrALAZINE (APRESOLINE) 25 MG tablet Take 1 tablet (25 mg total) by mouth 3 (three) times daily.  90 tablet  11  . linagliptin (TRADJENTA) 5 MG TABS tablet Take 5 mg by mouth daily.      Marland Kitchen losartan (COZAAR) 100 MG tablet Take 100 mg by mouth daily.      . metFORMIN (GLUCOPHAGE) 1000 MG tablet Take 1,000 mg by mouth 2 (two) times daily with a meal.      . Omega-3 Fatty Acids (FISH OIL PO) Take 360 mg by mouth daily.      . rosuvastatin (CRESTOR) 10 MG tablet Take 1 tablet (10 mg total) by mouth once a week.      . spironolactone (ALDACTONE) 25 MG tablet Take 1 tablet (25 mg total) by mouth daily.  30 tablet  11  . Vitamin D, Ergocalciferol, (DRISDOL) 50000 UNITS CAPS capsule Take 50,000 Units by mouth every 7 (seven) days. Mondays       No current facility-administered medications for this visit.    Allergies:   Crestor; Hydrocodone; Other; Oxycodone; Pravachol; and Simvastatin   Social History:  The patient  reports that she  has never smoked. She has never used smokeless tobacco. She reports that she does not drink alcohol or use illicit drugs.   Family History:  The patient's family history includes CVA in her maternal grandmother; Diabetes in her maternal grandmother and mother; Hypercholesterolemia in her mother; Hypertension in her maternal grandmother, mother, and sister. There is no history of Heart attack.   ROS:  Please see the history of present illness.     All other systems reviewed and negative.   PHYSICAL EXAM: VS:  BP 130/90  Pulse 84  Ht 5\' 8"  (1.727 m)  Wt 286 lb (129.729 kg)  BMI 43.50 kg/m2  LMP 05/09/2012 Well nourished, well developed, in no acute distress HEENT: normal Neck: I cannot assess JVD Cardiac:  normal S1, S2; RRR; no murmur Lungs:  clear to auscultation bilaterally, no wheezing, rhonchi or rales Abd: soft, nontender, no hepatomegaly Ext: no edema Skin: warm and dry Neuro:  CNs 2-12 intact, no focal  abnormalities noted   ASSESSMENT AND PLAN:  1. Essential hypertension:  BP improved.  She has been dieting and walking for exercise.  I recommend no changes.  Will see how TLCs effect her BP.  If BP above target at FU, consider increasing Hydralazine.   2. Cardiomyopathy:   Continue beta blocker, ARB, spironolactone, hydralazine. 3. Hyperlipidemia:  She remains on Crestor.  This is managed by primary care.   Disposition:  FU with me in 3 mos.    Signed, Versie Starks, MHS 06/06/2014 11:11 AM    [facility] [place], [place], [place]  [address] Phone: [phone]; Fax: [phone]

## 2014-06-06 NOTE — Patient Instructions (Signed)
Your physician recommends that you continue on your current medications as directed. Please refer to the Current Medication list given to you today.  Your physician recommends that you schedule a follow-up appointment in: 3 months with Claudia [name].

## 2014-07-30 ENCOUNTER — Encounter: Payer: Self-pay | Admitting: Physician Assistant

## 2014-08-15 ENCOUNTER — Encounter: Payer: Self-pay | Admitting: Cardiology

## 2014-09-05 ENCOUNTER — Encounter: Payer: Self-pay | Admitting: Cardiology

## 2014-09-05 ENCOUNTER — Ambulatory Visit (INDEPENDENT_AMBULATORY_CARE_PROVIDER_SITE_OTHER): Payer: Managed Care, Other (non HMO) | Admitting: Cardiology

## 2014-09-05 VITALS — BP 130/80 | HR 83 | Ht 68.0 in | Wt 296.0 lb

## 2014-09-05 DIAGNOSIS — E785 Hyperlipidemia, unspecified: Secondary | ICD-10-CM

## 2014-09-05 DIAGNOSIS — I10 Essential (primary) hypertension: Secondary | ICD-10-CM

## 2014-09-05 DIAGNOSIS — I429 Cardiomyopathy, unspecified: Secondary | ICD-10-CM

## 2014-09-05 NOTE — Patient Instructions (Signed)
Your physician wants you to follow-up in: 6 MONTHS with Dr Marlou Porch. You will receive a reminder letter in the mail two months in advance. If you don't receive a letter, please call our office to schedule the follow-up appointment.  Your physician recommends that you continue on your current medications as directed. Please refer to the Current Medication list given to you today.

## 2014-09-05 NOTE — Progress Notes (Signed)
[facility]. [address], [address], [place]  [address] Phone: [phone] Fax:  [phone]  Date:  09/05/2014   ID:  Claudia [name], DOB Oct 23, 1965, MRN [id number]  PCP:  Gara Kroner, MD   History of Present Illness: Claudia [name] is a 48 y.o. female with hypertension, diabetes, hyperlipidemia, cardiomyopathy, systolic LV dysfunction ejection fraction 40-45% with moderate LVH in the setting of hypertensive urgency. Blood pressure previously has been difficult to control. Hydralazine was added at prior visit but she states that she has not been taking this medication. Weight loss previously 10 pounds.. At [facility] hospital 2014 with HTN urgency. Was off BP meds at the time. Felt like tons of weight on chest and head. Got her back on meds. Improved. ECHO done. Concerned about cardiomegally. Many of her blood pressures at home have been in normal range. At previous physician visit, normal. It is quite challenging to hear her blood pressure effectively manual.  Echocardiogram in the hospital setting showed EF 40-45% with moderate LVH.   She currently states that she feels much better, no significant shortness of breath anymore, only mild with exertion. Her shortness of breath was likely related to hypertensive urgency and her decreased ejection fraction also related to hypertension.   I have reviewed Dr. Moreen Fowler note. In regards to statin intolerance, He does state in his note that there was a trial of Crestor once weekly however she reports that she did not take it as such. Hemoglobin A1c 10.7.   Wt Readings from Last 3 Encounters:  09/05/14 296 lb (134.265 kg)  06/06/14 286 lb (129.729 kg)  05/03/14 288 lb (130.636 kg)     Past Medical History  Diagnosis Date  . Diabetes mellitus   . Hypertension   . Hyperlipidemia   . Headache(784.0)   . Seizures     as child  . Hypercholesterolemia   . Fibroids   . Vitamin D deficiency   . Right knee pain   . Osteoarthritis     , with mild  meniscus tear, followed by orthopedics--Dr Gladstone Lighter 04/29/10    Past Surgical History  Procedure Laterality Date  . Shoulder surgery Right 11    rotator cuff  . Laparoscopic assisted vaginal hysterectomy  1/14  . Umbilical hernia repair    . Tubal ligation    . Anterior cervical decomp/discectomy fusion N/A 09/07/2013    Procedure: ANTERIOR CERVICAL DECOMPRESSION/DISCECTOMY FUSION 1 LEVEL;  Surgeon: Sinclair Ship, MD;  Location: [facility];  Service: Orthopedics;  Laterality: N/A;  Anterior cervical decompression fusion, cervical 5-6 with instrumentation and allograft    Current Outpatient Prescriptions  Medication Sig Dispense Refill  . amLODipine (NORVASC) 10 MG tablet Take 10 mg by mouth daily.    . Canagliflozin (INVOKANA) 100 MG TABS Take 1 tablet by mouth daily.    . carvedilol (COREG) 25 MG tablet Take 1.5 tablets (37.5 mg total) by mouth 2 (two) times daily with a meal. 90 tablet 11  . chlorhexidine (PERIDEX) 0.12 % solution Use as directed 0.12 mLs in the mouth or throat 2 (two) times daily. Dentist mouth wash    . linagliptin (TRADJENTA) 5 MG TABS tablet Take 5 mg by mouth daily.    Marland Kitchen losartan (COZAAR) 100 MG tablet Take 100 mg by mouth daily.    . metFORMIN (GLUCOPHAGE) 1000 MG tablet Take 1,000 mg by mouth 2 (two) times daily with a meal.    . Omega-3 Fatty Acids (FISH OIL PO) Take 360  mg by mouth daily.    . rosuvastatin (CRESTOR) 10 MG tablet Take 1 tablet (10 mg total) by mouth once a week.    . spironolactone (ALDACTONE) 25 MG tablet Take 1 tablet (25 mg total) by mouth daily. 30 tablet 11  . Vitamin D, Ergocalciferol, (DRISDOL) 50000 UNITS CAPS capsule Take 50,000 Units by mouth every 7 (seven) days. Mondays     No current facility-administered medications for this visit.    Allergies:    Allergies  Allergen Reactions  . Crestor [Rosuvastatin]     MUSCLE ACHES HEADACHES   . Hydrocodone Itching  . Other Hives, Swelling and Other (See Comments)    Vision changes;  ALLERGIC TO ALL NUTS EXCEPT PEANUTS  . Oxycodone Itching  . Pravachol [Pravastatin Sodium] Itching    Joint pain  . Simvastatin Hives    Social History:  The patient  reports that she has never smoked. She has never used smokeless tobacco. She reports that she does not drink alcohol or use illicit drugs.   ROS:  Please see the history of present illness.   No strokelike symptoms, no fevers, no chills, no chest pain, no shortness of breath    PHYSICAL EXAM: VS:  BP 130/80 mmHg  Pulse 83  Ht 5\' 8"  (1.727 m)  Wt 296 lb (134.265 kg)  BMI 45.02 kg/m2  LMP 05/09/2012 Well nourished, well developed, in no acute distress HEENT: normal Neck: no JVD Cardiac:  normal S1, S2; RRR; no murmur Lungs:  clear to auscultation bilaterally, no wheezing, rhonchi or rales Abd: soft, nontender, no hepatomegalyobese Ext: no edema Skin: warm and dry Neuro: no focal abnormalities noted  EKG:  None today     ASSESSMENT AND PLAN:  1. Hypertension-uncontrolled previously. She states that she is taking her medications. Expressed the importance of obtaining her medications. I have added spironolactone 25 mg once a day. I have changed her metoprolol over to carvedilol 25 mg twice a day. Occasionally, the addition of spironolactone will help out significantly with uncontrolled or difficult to control hypertension. Previous basic metabolic profile has been within normal range, no evidence of hyperkalemia. She did not take hydralazine.  2. Morbid obesity-continue to encourage weight loss. This can significantly help with both her diabetes as well as hypertension.  3. Diabetes-this will improve with weight loss. 4. Statin intolerance-I will try Crestor 10 mg once weekly. She has had prior myalgias/joint discomfort on once daily Crestor, pravastatin as well.  5. Six-month follow-up  Signed, Candee Furbish, MD [facility]  09/05/2014 10:05 AM

## 2014-09-06 ENCOUNTER — Telehealth: Payer: Self-pay | Admitting: Cardiology

## 2014-09-06 NOTE — Telephone Encounter (Signed)
Wife calling stating her husband was with her yesterday when she saw Dr. Marlou Porch.  He needs a note to take to work stating he accompanied her to the appointment. Will leave note at front desk.

## 2014-09-06 NOTE — Telephone Encounter (Signed)
New message     Patient calling stating her hubsand took off work  12/ 9 for her MD appt. He will need a note excuse him from 12/9. Appt.

## 2014-09-17 ENCOUNTER — Other Ambulatory Visit: Payer: Self-pay

## 2014-09-17 DIAGNOSIS — Z1231 Encounter for screening mammogram for malignant neoplasm of breast: Secondary | ICD-10-CM

## 2014-10-19 ENCOUNTER — Ambulatory Visit: Payer: Managed Care, Other (non HMO)

## 2014-11-02 ENCOUNTER — Ambulatory Visit: Payer: Managed Care, Other (non HMO)

## 2015-03-04 ENCOUNTER — Encounter: Payer: Self-pay | Admitting: Cardiology

## 2015-03-04 ENCOUNTER — Other Ambulatory Visit: Payer: Self-pay

## 2015-03-04 ENCOUNTER — Ambulatory Visit (HOSPITAL_COMMUNITY): Payer: Managed Care, Other (non HMO) | Attending: Cardiology

## 2015-03-04 ENCOUNTER — Ambulatory Visit (INDEPENDENT_AMBULATORY_CARE_PROVIDER_SITE_OTHER): Payer: Managed Care, Other (non HMO) | Admitting: Cardiology

## 2015-03-04 VITALS — BP 140/88 | HR 93 | Ht 68.0 in | Wt 300.1 lb

## 2015-03-04 DIAGNOSIS — E669 Obesity, unspecified: Secondary | ICD-10-CM | POA: Diagnosis not present

## 2015-03-04 DIAGNOSIS — I429 Cardiomyopathy, unspecified: Secondary | ICD-10-CM

## 2015-03-04 DIAGNOSIS — E785 Hyperlipidemia, unspecified: Secondary | ICD-10-CM

## 2015-03-04 DIAGNOSIS — I10 Essential (primary) hypertension: Secondary | ICD-10-CM | POA: Diagnosis not present

## 2015-03-04 NOTE — Patient Instructions (Addendum)
Medication Instructions:  Your physician recommends that you continue on your current medications as directed. Please refer to the Current Medication list given to you today.  Testing/Procedures: Your physician has requested that you have an echocardiogram. Echocardiography is a painless test that uses sound waves to create images of your heart. It provides your doctor with information about the size and shape of your heart and how well your heart's chambers and valves are working. This procedure takes approximately one hour. There are no restrictions for this procedure.  Follow-Up: Follow up in 1 year with Dr. Marlou Porch.  You will receive a letter in the mail 2 months before you are due.  Please call us when you receive this letter to schedule your follow up appointment.  Thank you for choosing [facility]!!

## 2015-03-04 NOTE — Progress Notes (Signed)
[facility]. [address], [address] [place], [place]  [address] Phone: [phone] Fax:  [phone]  Date:  03/04/2015   ID:  Claudia [name], DOB [birth year], MRN [id number]  PCP:  Gara Kroner, MD   History of Present Illness: Claudia [name] is a 49 y.o. female with hypertension, diabetes, hyperlipidemia, cardiomyopathy, systolic LV dysfunction ejection fraction 40-45% with moderate LVH in the setting of hypertensive urgency. Blood pressure previously has been difficult to control. Hydralazine was added at prior visit but she states that she has not been taking this medication. Weight loss previously 10 pounds.. At [facility] hospital 2014 with HTN urgency. Was off BP meds at the time. Felt like tons of weight on chest and head. Got her back on meds. Improved. ECHO done. Concerned about cardiomegally. Many of her blood pressures at home have been in normal range. At previous physician visit, normal. It is quite challenging to hear her blood pressure effectively manual.  Echocardiogram in the hospital setting showed EF 40-45% with moderate LVH.   She currently states that she feels much better, no significant shortness of breath anymore, only mild with exertion. Her shortness of breath was likely related to hypertensive urgency and her decreased ejection fraction also related to hypertension.   I have reviewed Dr. Moreen Fowler note. In regards to statin intolerance, He does state in his note that there was a trial of Crestor once weekly however she reports that she did not take it as such. Hemoglobin A1c 10.7.   Wt Readings from Last 3 Encounters:  03/04/15 300 lb 1.9 oz (136.134 kg)  09/05/14 296 lb (134.265 kg)  06/06/14 286 lb (129.729 kg)     Past Medical History  Diagnosis Date  . Diabetes mellitus   . Hypertension   . Hyperlipidemia   . Headache(784.0)   . Seizures     as child  . Hypercholesterolemia   . Fibroids   . Vitamin D deficiency   . Right knee pain   . Osteoarthritis     , with  mild meniscus tear, followed by orthopedics--Dr Gladstone Lighter 04/29/10    Past Surgical History  Procedure Laterality Date  . Shoulder surgery Right 11    rotator cuff  . Laparoscopic assisted vaginal hysterectomy  1/14  . Umbilical hernia repair    . Tubal ligation    . Anterior cervical decomp/discectomy fusion N/A 09/07/2013    Procedure: ANTERIOR CERVICAL DECOMPRESSION/DISCECTOMY FUSION 1 LEVEL;  Surgeon: Sinclair Ship, MD;  Location: [facility];  Service: Orthopedics;  Laterality: N/A;  Anterior cervical decompression fusion, cervical 5-6 with instrumentation and allograft    Current Outpatient Prescriptions  Medication Sig Dispense Refill  . amLODipine (NORVASC) 10 MG tablet Take 10 mg by mouth daily.    . carvedilol (COREG) 25 MG tablet Take 1.5 tablets (37.5 mg total) by mouth 2 (two) times daily with a meal. 90 tablet 11  . ferrous sulfate 325 (65 FE) MG tablet Take 325 mg by mouth 2 (two) times daily.    Marland Kitchen JARDIANCE 25 MG TABS tablet Take 25 mg by mouth daily.    Marland Kitchen linagliptin (TRADJENTA) 5 MG TABS tablet Take 5 mg by mouth daily.    Marland Kitchen losartan (COZAAR) 100 MG tablet Take 100 mg by mouth daily.    . metFORMIN (GLUCOPHAGE) 1000 MG tablet Take 1,000 mg by mouth 2 (two) times daily with a meal.    . Omega-3 Fatty Acids (FISH OIL PO) Take 360 mg by mouth daily.    Marland Kitchen  rosuvastatin (CRESTOR) 10 MG tablet Take 1 tablet (10 mg total) by mouth once a week.    . spironolactone (ALDACTONE) 25 MG tablet Take 1 tablet (25 mg total) by mouth daily. 30 tablet 11  . Vitamin D, Ergocalciferol, (DRISDOL) 50000 UNITS CAPS capsule Take 50,000 Units by mouth every 7 (seven) days. Mondays     No current facility-administered medications for this visit.    Allergies:    Allergies  Allergen Reactions  . Crestor [Rosuvastatin]     MUSCLE ACHES HEADACHES   . Hydrocodone Itching  . Other Hives, Swelling and Other (See Comments)    Vision changes; ALLERGIC TO ALL NUTS EXCEPT PEANUTS  . Oxycodone  Itching  . Pravachol [Pravastatin Sodium] Itching    Joint pain  . Simvastatin Hives    Social History:  The patient  reports that she has never smoked. She has never used smokeless tobacco. She reports that she does not drink alcohol or use illicit drugs.   ROS:  Please see the history of present illness.   No strokelike symptoms, no fevers, no chills, no chest pain, no shortness of breath    PHYSICAL EXAM: VS:  BP 140/88 mmHg  Pulse 93  Ht 5\' 8"  (1.727 m)  Wt 300 lb 1.9 oz (136.134 kg)  BMI 45.64 kg/m2  SpO2 99%  LMP 05/09/2012 Well nourished, well developed, in no acute distress HEENT: normal Neck: no JVD Cardiac:  normal S1, S2; RRR; no murmur Lungs:  clear to auscultation bilaterally, no wheezing, rhonchi or rales Abd: soft, nontender, no hepatomegalyobese Ext: no edema Skin: warm and dry Neuro: no focal abnormalities noted  EKG:  None today     ASSESSMENT AND PLAN:  1. Cardiomyopathy-echocardiogram from 06/02/12 showed ejection fraction in the 40-45% range. We will repeat. 2. Hypertension-uncontrolled previously. She states that she is taking her medications but occasionally will miss them secondary to cost. Expressed the importance of obtaining her medications. Spironolactone 25 mg once a day. Carvedilol 25 mg twice a day. Occasionally, the addition of spironolactone will help out significantly with uncontrolled or difficult to control hypertension. Previous basic metabolic profile has been within normal range, no evidence of hyperkalemia.  3. Morbid obesity-continue to encourage weight loss. This can significantly help with both her diabetes as well as hypertension. 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 Hopefully she will be able to be active enough to continue to lose weight. She hurt her shoulder at a previous job. 4. Diabetes-this will improve with weight loss. 5. Statin intolerance- Crestor 10 mg once weekly. She has had prior myalgias/joint discomfort on  once daily Crestor, pravastatin as well.  6. She requested one year follow-up. This seems reasonable especially if Dr. Moreen Fowler is monitoring as well.  Signed, Candee Furbish, MD [facility]  03/04/2015 11:47 AM

## 2015-03-15 ENCOUNTER — Other Ambulatory Visit: Payer: Self-pay | Admitting: Cardiology

## 2015-03-20 ENCOUNTER — Encounter (HOSPITAL_COMMUNITY): Payer: Self-pay | Admitting: Emergency Medicine

## 2015-03-20 ENCOUNTER — Emergency Department (HOSPITAL_COMMUNITY): Payer: Managed Care, Other (non HMO)

## 2015-03-20 ENCOUNTER — Emergency Department (HOSPITAL_COMMUNITY)
Admission: EM | Admit: 2015-03-20 | Discharge: 2015-03-20 | Disposition: A | Discharge status: Home / Self Care | Payer: Managed Care, Other (non HMO) | Attending: Emergency Medicine | Admitting: Emergency Medicine

## 2015-03-20 DIAGNOSIS — E119 Type 2 diabetes mellitus without complications: Secondary | ICD-10-CM | POA: Insufficient documentation

## 2015-03-20 DIAGNOSIS — Z86018 Personal history of other benign neoplasm: Secondary | ICD-10-CM | POA: Diagnosis not present

## 2015-03-20 DIAGNOSIS — E785 Hyperlipidemia, unspecified: Secondary | ICD-10-CM | POA: Diagnosis not present

## 2015-03-20 DIAGNOSIS — M1711 Unilateral primary osteoarthritis, right knee: Secondary | ICD-10-CM

## 2015-03-20 DIAGNOSIS — M79604 Pain in right leg: Secondary | ICD-10-CM | POA: Diagnosis present

## 2015-03-20 DIAGNOSIS — I10 Essential (primary) hypertension: Secondary | ICD-10-CM | POA: Insufficient documentation

## 2015-03-20 DIAGNOSIS — M25461 Effusion, right knee: Secondary | ICD-10-CM

## 2015-03-20 DIAGNOSIS — E559 Vitamin D deficiency, unspecified: Secondary | ICD-10-CM | POA: Diagnosis not present

## 2015-03-20 DIAGNOSIS — M25462 Effusion, left knee: Secondary | ICD-10-CM | POA: Insufficient documentation

## 2015-03-20 DIAGNOSIS — Z79899 Other long term (current) drug therapy: Secondary | ICD-10-CM | POA: Insufficient documentation

## 2015-03-20 DIAGNOSIS — E780 Pure hypercholesterolemia: Secondary | ICD-10-CM | POA: Diagnosis not present

## 2015-03-20 DIAGNOSIS — M25561 Pain in right knee: Secondary | ICD-10-CM

## 2015-03-20 MED ORDER — TRAMADOL HCL 50 MG PO TABS: 50.0000 mg | ORAL_TABLET | Freq: Four times a day (QID) | ORAL | Status: DC | PRN

## 2015-03-20 MED ORDER — MELOXICAM 7.5 MG PO TABS: 7.5000 mg | ORAL_TABLET | Freq: Every day | ORAL | Status: DC

## 2015-03-20 NOTE — ED Provider Notes (Signed)
CSN: [id number]     Arrival date & time 03/20/15  1207 History   First MD Initiated Contact with Patient 03/20/15 1223     Chief Complaint  Patient presents with  . Leg Pain     (Consider location/radiation/quality/duration/timing/severity/associated sxs/prior Treatment) HPI  Pt is a morbidly obese female presenting to ED with c/o gradually worsening Right knee pain that started when she started her current job on June 8th.  Pt states she is on her feet a lot at work.  Has since noticed increased swelling and pain. Pain is constant, throbbing and sharp, worse with palpation and ambulation, 9/10 at worst. No relief with RICE home therapy, aleve, or extra strength tylenol. States she saw an orthopedist several years ago and was advised she would eventually need a knee replacement but has not f/u in several years as she has not had a problem until recently. Denies new injury just as heavy lifting, twisting knee, or falls.  Denies skin changes of Right knee.   Past Medical History  Diagnosis Date  . Diabetes mellitus   . Hypertension   . Hyperlipidemia   . Headache(784.0)   . Seizures     as child  . Hypercholesterolemia   . Fibroids   . Vitamin D deficiency   . Right knee pain   . Osteoarthritis     , with mild meniscus tear, followed by orthopedics--Dr Gladstone Lighter 04/29/10   Past Surgical History  Procedure Laterality Date  . Shoulder surgery Right 11    rotator cuff  . Laparoscopic assisted vaginal hysterectomy  1/14  . Umbilical hernia repair    . Tubal ligation    . Anterior cervical decomp/discectomy fusion N/A 09/07/2013    Procedure: ANTERIOR CERVICAL DECOMPRESSION/DISCECTOMY FUSION 1 LEVEL;  Surgeon: Sinclair Ship, MD;  Location: [facility];  Service: Orthopedics;  Laterality: N/A;  Anterior cervical decompression fusion, cervical 5-6 with instrumentation and allograft   Family History  Problem Relation Age of Onset  . Hypertension Mother   . Diabetes Mother   .  Hypercholesterolemia Mother   . Hypertension Sister   . CVA Maternal Grandmother   . Hypertension Maternal Grandmother   . Diabetes Maternal Grandmother   . Heart attack Neg Hx    History  Substance Use Topics  . Smoking status: Never Smoker   . Smokeless tobacco: Never Used  . Alcohol Use: No   OB History    Gravida Para Term Preterm AB TAB SAB Ectopic Multiple Living   4 4 3 1      3      Review of Systems  Musculoskeletal: Positive for myalgias, joint swelling, arthralgias and gait problem. Negative for back pain, neck pain and neck stiffness.       Right knee pain.  Skin: Negative for color change, pallor, rash and wound.  All other systems reviewed and are negative.     Allergies  Crestor; Hydrocodone; Other; Oxycodone; Pravachol; and Simvastatin  Home Medications   Prior to Admission medications   Medication Sig Start Date End Date Taking? Authorizing Provider  amLODipine (NORVASC) 10 MG tablet Take 10 mg by mouth daily.    Historical Provider, MD  carvedilol (COREG) 25 MG tablet Take 1.5 tablets (37.5 mg total) by mouth 2 (two) times daily with a meal. 03/16/14   Liliane Shi, PA-C  ferrous sulfate 325 (65 FE) MG tablet Take 325 mg by mouth 2 (two) times daily.    Historical Provider, MD  JARDIANCE 25 MG TABS  tablet Take 25 mg by mouth daily. 02/16/15   Historical Provider, MD  linagliptin (TRADJENTA) 5 MG TABS tablet Take 5 mg by mouth daily.    Historical Provider, MD  losartan (COZAAR) 100 MG tablet Take 100 mg by mouth daily.    Historical Provider, MD  meloxicam (MOBIC) 7.5 MG tablet Take 1 tablet (7.5 mg total) by mouth daily. 03/20/15   Noland Fordyce, PA-C  metFORMIN (GLUCOPHAGE) 1000 MG tablet Take 1,000 mg by mouth 2 (two) times daily with a meal.    Historical Provider, MD  Omega-3 Fatty Acids (FISH OIL PO) Take 360 mg by mouth daily.    Historical Provider, MD  rosuvastatin (CRESTOR) 10 MG tablet Take 1 tablet (10 mg total) by mouth once a week. 02/21/14    Jerline Pain, MD  spironolactone (ALDACTONE) 25 MG tablet TAKE ONE TABLET BY MOUTH ONCE DAILY 03/18/15   Jerline Pain, MD  traMADol (ULTRAM) 50 MG tablet Take 1 tablet (50 mg total) by mouth every 6 (six) hours as needed. 03/20/15   Noland Fordyce, PA-C  Vitamin D, Ergocalciferol, (DRISDOL) 50000 UNITS CAPS capsule Take 50,000 Units by mouth every 7 (seven) days. Mondays    Historical Provider, MD   BP 147/95 mmHg  Pulse 91  Temp(Src) 98.9 F (37.2 C) (Oral)  Resp 18  SpO2 97%  LMP 05/09/2012 Physical Exam  Constitutional: She is oriented to person, place, and time. She appears well-developed and well-nourished.  Morbidly obese female sitting in exam chair. Crutches at bedside.  HENT:  Head: Normocephalic and atraumatic.  Eyes: EOM are normal.  Neck: Normal range of motion.  Cardiovascular: Normal rate.   Pulses:      Dorsalis pedis pulses are 2+ on the right side.  Pulmonary/Chest: Effort normal.  Musculoskeletal: Normal range of motion. She exhibits edema and tenderness.  Right knee: obese knee, more swollen compared to Left knee. Medial and lateral joint space tenderness. FROM w/o crepitus. No calf tenderness. Compartments are soft. Antalgic gait, uses crutches for assistance.   Neurological: She is alert and oriented to person, place, and time.  Right leg: sensation in tact. Symmetric compared to Left leg.  Skin: Skin is warm and dry.  Right knee: Skin in tact. No ecchymosis, erythema, or warmth. No red streaking, induration, or evidence of underlying infection  Psychiatric: She has a normal mood and affect. Her behavior is normal.  Nursing note and vitals reviewed.   ED Course  Procedures (including critical care time) Labs Review Labs Reviewed - No data to display  Imaging Review Dg Knee Complete 4 Views Right  03/20/2015   CLINICAL DATA:  Knee pain since June 8th. No known injury. Pain is worsening, especially with walking.  EXAM: RIGHT KNEE - COMPLETE 4+ VIEW   COMPARISON:  None.  FINDINGS: Mild joint space narrowing in the medial and patellofemoral compartments. Early spurring in the knee. Moderate joint effusion. No acute bony abnormality. Specifically, no fracture, subluxation, or dislocation. Soft tissues are intact.  IMPRESSION: Mild degenerative changes and moderate joint effusion. No acute bony abnormality.   Electronically Signed   By: Rolm Baptise M.D.   On: 03/20/2015 13:00     EKG Interpretation None      MDM   Final diagnoses:  Knee effusion, right  Right knee pain  Osteoarthritis of right knee, unspecified osteoarthritis type    Pt c/o Right knee pain worsening since June 8th after starting new job that requires her to be on feet all  day. No known injury. Hx of prior Right knee injury years ago. Knee swelling and tenderness on exam. No evidence of underlying infect. Doubt septic joint.  Compartments soft. Plain films: significant for mild degenerative changes and moderate joint effusion.  Pt has knee sleeve at home and came to ED with crutches.  Rx: tramadol and mobic. Home care instructions provided. Strongly encouraged pt to f/u with an orthopedist, contact information for Dr. Tamera Punt provided, and/or f/u with PCP for referral as pt may benefit from joint injections or other conservative treatment. Return precautions provided. Pt verbalized understanding and agreement with tx plan.     Noland Fordyce, PA-C 03/20/15 [place], MD 03/20/15 [phone]

## 2015-03-20 NOTE — ED Notes (Addendum)
Pt complaint of right leg swelling and pain with ambulation since new job/lot of walking on feet. Hx of right knee pain.

## 2015-05-16 ENCOUNTER — Other Ambulatory Visit: Payer: Self-pay | Admitting: Physician Assistant

## 2015-07-16 ENCOUNTER — Encounter (HOSPITAL_COMMUNITY): Payer: Self-pay | Admitting: Emergency Medicine

## 2015-07-16 ENCOUNTER — Emergency Department (HOSPITAL_COMMUNITY)
Admission: EM | Admit: 2015-07-16 | Discharge: 2015-07-16 | Disposition: A | Discharge status: Home / Self Care | Payer: Managed Care, Other (non HMO) | Attending: Emergency Medicine | Admitting: Emergency Medicine

## 2015-07-16 DIAGNOSIS — Z79899 Other long term (current) drug therapy: Secondary | ICD-10-CM | POA: Diagnosis not present

## 2015-07-16 DIAGNOSIS — E785 Hyperlipidemia, unspecified: Secondary | ICD-10-CM | POA: Insufficient documentation

## 2015-07-16 DIAGNOSIS — E119 Type 2 diabetes mellitus without complications: Secondary | ICD-10-CM | POA: Diagnosis not present

## 2015-07-16 DIAGNOSIS — I10 Essential (primary) hypertension: Secondary | ICD-10-CM | POA: Insufficient documentation

## 2015-07-16 DIAGNOSIS — Z86018 Personal history of other benign neoplasm: Secondary | ICD-10-CM | POA: Diagnosis not present

## 2015-07-16 DIAGNOSIS — Y9289 Other specified places as the place of occurrence of the external cause: Secondary | ICD-10-CM | POA: Diagnosis not present

## 2015-07-16 DIAGNOSIS — Y998 Other external cause status: Secondary | ICD-10-CM | POA: Diagnosis not present

## 2015-07-16 DIAGNOSIS — Y9389 Activity, other specified: Secondary | ICD-10-CM | POA: Insufficient documentation

## 2015-07-16 DIAGNOSIS — S46911A Strain of unspecified muscle, fascia and tendon at shoulder and upper arm level, right arm, initial encounter: Secondary | ICD-10-CM | POA: Diagnosis not present

## 2015-07-16 DIAGNOSIS — X58XXXA Exposure to other specified factors, initial encounter: Secondary | ICD-10-CM | POA: Diagnosis not present

## 2015-07-16 DIAGNOSIS — R42 Dizziness and giddiness: Secondary | ICD-10-CM | POA: Insufficient documentation

## 2015-07-16 DIAGNOSIS — R200 Anesthesia of skin: Secondary | ICD-10-CM | POA: Diagnosis not present

## 2015-07-16 DIAGNOSIS — S46811A Strain of other muscles, fascia and tendons at shoulder and upper arm level, right arm, initial encounter: Secondary | ICD-10-CM

## 2015-07-16 DIAGNOSIS — E7800 Pure hypercholesterolemia, unspecified: Secondary | ICD-10-CM | POA: Insufficient documentation

## 2015-07-16 DIAGNOSIS — Z791 Long term (current) use of non-steroidal anti-inflammatories (NSAID): Secondary | ICD-10-CM | POA: Insufficient documentation

## 2015-07-16 LAB — CBC
HEMATOCRIT: 40.1 % (ref 36.0–46.0)
HEMOGLOBIN: 12.5 g/dL (ref 12.0–15.0)
MCH: 22.1 pg — ABNORMAL LOW (ref 26.0–34.0)
MCHC: 31.2 g/dL (ref 30.0–36.0)
MCV: 71 fL — ABNORMAL LOW (ref 78.0–100.0)
Platelets: 361 10*3/uL (ref 150–400)
RBC: 5.65 MIL/uL — AB (ref 3.87–5.11)
RDW: 15.5 % (ref 11.5–15.5)
WBC: 6 10*3/uL (ref 4.0–10.5)

## 2015-07-16 LAB — URINE MICROSCOPIC-ADD ON

## 2015-07-16 LAB — URINALYSIS, ROUTINE W REFLEX MICROSCOPIC
Bilirubin Urine: NEGATIVE
Glucose, UA: >1000 mg/dL — AB
HGB URINE DIPSTICK: NEGATIVE
KETONES UR: 15 mg/dL — AB
LEUKOCYTES UA: NEGATIVE
Nitrite: NEGATIVE
PROTEIN: NEGATIVE mg/dL
SPECIFIC GRAVITY, URINE: 1.033 — AB (ref 1.005–1.030)
UROBILINOGEN UA: 0.2 mg/dL (ref 0.0–1.0)
pH: 5.5 (ref 5.0–8.0)

## 2015-07-16 LAB — BASIC METABOLIC PANEL
ANION GAP: 11 (ref 5–15)
BUN: 6 mg/dL (ref 6–20)
CO2: 25 mmol/L (ref 22–32)
Calcium: 9.5 mg/dL (ref 8.9–10.3)
Chloride: 103 mmol/L (ref 101–111)
Creatinine, Ser: 0.5 mg/dL (ref 0.44–1.00)
GFR calc Af Amer: >60 mL/min (ref >60)
GLUCOSE: 146 mg/dL — AB (ref 65–99)
POTASSIUM: 3.9 mmol/L (ref 3.5–5.1)
Sodium: 139 mmol/L (ref 135–145)

## 2015-07-16 LAB — CBG MONITORING, ED: Glucose-Capillary: 152 mg/dL — ABNORMAL HIGH (ref 65–99)

## 2015-07-16 NOTE — Discharge Instructions (Signed)
Drink plenty of fluids. Continue to monitor your diabetes. Follow with your doctor and the neurologist for further evaluation.   Take off the sling at least once a day and do shoulder range of motion exercises Dizziness Dizziness is a common problem. It makes you feel unsteady or lightheaded. You may feel like you are about to pass out (faint). Dizziness can lead to injury if you stumble or fall. Anyone can get dizzy, but dizziness is more common in older adults. This condition can be caused by a number of things, including:  Medicines.  Dehydration.  Illness. HOME CARE Following these instructions may help with your condition: Eating and Drinking  Drink enough fluid to keep your pee (urine) clear or pale yellow. This helps to keep you from getting dehydrated. Try to drink more clear fluids, such as water.  Do not drink alcohol.  Limit how much caffeine you drink or eat if told by your doctor.  Limit how much salt you drink or eat if told by your doctor. Activity  Avoid making quick movements.  When you stand up from sitting in a chair, steady yourself until you feel okay.  In the morning, first sit up on the side of the bed. When you feel okay, stand slowly while you hold onto something. Do this until you know that your balance is fine.  Move your legs often if you need to stand in one place for a long time. Tighten and relax your muscles in your legs while you are standing.  Do not drive or use heavy machinery if you feel dizzy.  Avoid bending down if you feel dizzy. Place items in your home so that they are easy for you to reach without leaning over. Lifestyle  Do not use any tobacco products, including cigarettes, chewing tobacco, or electronic cigarettes. If you need help quitting, ask your doctor.  Try to lower your stress level, such as with yoga or meditation. Talk with your doctor if you need help. General Instructions  Watch your dizziness for any changes.  Take  medicines only as told by your doctor. Talk with your doctor if you think that your dizziness is caused by a medicine that you are taking.  Tell a friend or a family member that you are feeling dizzy. If he or she notices any changes in your behavior, have this person call your doctor.  Keep all follow-up visits as told by your doctor. This is important. GET HELP IF:  Your dizziness does not go away.  Your dizziness or light-headedness gets worse.  You feel sick to your stomach (nauseous).  You have trouble hearing.  You have new symptoms.  You are unsteady on your feet or you feel like the room is spinning. GET HELP RIGHT AWAY IF:  You throw up (vomit) or have diarrhea and are unable to eat or drink anything.  You have trouble:  Talking.  Walking.  Swallowing.  Using your arms, hands, or legs.  You feel generally weak.  You are not thinking clearly or you have trouble forming sentences. It may take a friend or family member to notice this.  You have:  Chest pain.  Pain in your belly (abdomen).  Shortness of breath.  Sweating.  Your vision changes.  You are bleeding.  You have a headache.  You have neck pain or a stiff neck.  You have a fever.   This information is not intended to replace advice given to you by your health care  provider. Make sure you discuss any questions you have with your health care provider.   Document Released: 09/03/2011 Document Revised: 01/29/2015 Document Reviewed: 09/10/2014 Elsevier Interactive Patient Education [employer].

## 2015-07-16 NOTE — ED Provider Notes (Signed)
CSN: [id number]     Arrival date & time 07/16/15  1222 History   First MD Initiated Contact with Patient 07/16/15 1331     Chief Complaint  Patient presents with  . Dizziness     (Consider location/radiation/quality/duration/timing/severity/associated sxs/prior Treatment) Patient is a 49 y.o. female presenting with general illness. The history is provided by the patient.  Illness Severity:  Moderate Onset quality:  Gradual Duration:  1 week Timing:  Constant Progression:  Worsening Chronicity:  New Associated symptoms: no chest pain, no congestion, no fever, no headaches, no myalgias, no nausea, no rhinorrhea, no shortness of breath, no vomiting and no wheezing     49 yo F  With a chief complaint of near-syncope. This been going on for the past couple weeks. Worse when she stands up or moves around. Patient denies any chest pain or shortness breath with this. Has had some diaphoresis with it. Denies nausea or vomiting denies diarrhea. Patient has also been having some right upper extremity paresthesias. This been going on for about the same time. Patient denies injury. Patient states she's been having some stiffness to her right side of her neck as well as her back.  Past Medical History  Diagnosis Date  . Diabetes mellitus   . Hypertension   . Hyperlipidemia   . Headache(784.0)   . Seizures ([place])     as child  . Hypercholesterolemia   . Fibroids   . Vitamin D deficiency   . Right knee pain   . Osteoarthritis     , with mild meniscus tear, followed by orthopedics--Dr Gladstone Lighter 04/29/10   Past Surgical History  Procedure Laterality Date  . Shoulder surgery Right 11    rotator cuff  . Laparoscopic assisted vaginal hysterectomy  1/14  . Umbilical hernia repair    . Tubal ligation    . Anterior cervical decomp/discectomy fusion N/A 09/07/2013    Procedure: ANTERIOR CERVICAL DECOMPRESSION/DISCECTOMY FUSION 1 LEVEL;  Surgeon: Sinclair Ship, MD;  Location: [facility];  Service:  Orthopedics;  Laterality: N/A;  Anterior cervical decompression fusion, cervical 5-6 with instrumentation and allograft   Family History  Problem Relation Age of Onset  . Hypertension Mother   . Diabetes Mother   . Hypercholesterolemia Mother   . Hypertension Sister   . CVA Maternal Grandmother   . Hypertension Maternal Grandmother   . Diabetes Maternal Grandmother   . Heart attack Neg Hx    Social History  Substance Use Topics  . Smoking status: Never Smoker   . Smokeless tobacco: Never Used  . Alcohol Use: No   OB History    Gravida Para Term Preterm AB TAB SAB Ectopic Multiple Living   4 4 3 1      3      Review of Systems  Constitutional: Positive for diaphoresis. Negative for fever and chills.  HENT: Negative for congestion and rhinorrhea.   Eyes: Negative for redness and visual disturbance.  Respiratory: Negative for shortness of breath and wheezing.   Cardiovascular: Negative for chest pain and palpitations.  Gastrointestinal: Negative for nausea and vomiting.  Genitourinary: Negative for dysuria and urgency.  Musculoskeletal: Negative for myalgias and arthralgias.  Skin: Negative for pallor and wound.  Neurological: Positive for weakness and numbness. Negative for dizziness and headaches.      Allergies  Crestor; Hydrocodone; Other; Oxycodone; Pravachol; and Simvastatin  Home Medications   Prior to Admission medications   Medication Sig Start Date End Date Taking? Authorizing Provider  amLODipine (NORVASC)  10 MG tablet Take 10 mg by mouth daily.    Historical Provider, MD  carvedilol (COREG) 25 MG tablet TAKE ONE & ONE-HALF TABLETS BY MOUTH TWICE DAILY WITH  A  MEAL 05/16/15   Jerline Pain, MD  ferrous sulfate 325 (65 FE) MG tablet Take 325 mg by mouth 2 (two) times daily.    Historical Provider, MD  JARDIANCE 25 MG TABS tablet Take 25 mg by mouth daily. 02/16/15   Historical Provider, MD  linagliptin (TRADJENTA) 5 MG TABS tablet Take 5 mg by mouth daily.     Historical Provider, MD  losartan (COZAAR) 100 MG tablet Take 100 mg by mouth daily.    Historical Provider, MD  meloxicam (MOBIC) 7.5 MG tablet Take 1 tablet (7.5 mg total) by mouth daily. 03/20/15   Noland Fordyce, PA-C  metFORMIN (GLUCOPHAGE) 1000 MG tablet Take 1,000 mg by mouth 2 (two) times daily with a meal.    Historical Provider, MD  Omega-3 Fatty Acids (FISH OIL PO) Take 360 mg by mouth daily.    Historical Provider, MD  rosuvastatin (CRESTOR) 10 MG tablet Take 1 tablet (10 mg total) by mouth once a week. 02/21/14   Jerline Pain, MD  spironolactone (ALDACTONE) 25 MG tablet TAKE ONE TABLET BY MOUTH ONCE DAILY 03/18/15   Jerline Pain, MD  traMADol (ULTRAM) 50 MG tablet Take 1 tablet (50 mg total) by mouth every 6 (six) hours as needed. 03/20/15   Noland Fordyce, PA-C  Vitamin D, Ergocalciferol, (DRISDOL) 50000 UNITS CAPS capsule Take 50,000 Units by mouth every 7 (seven) days. Mondays    Historical Provider, MD   BP 155/97 mmHg  Pulse 79  Temp(Src) 98.4 F (36.9 C) (Oral)  Resp 18  Wt 278 lb (126.1 kg)  SpO2 98%  LMP 05/09/2012 Physical Exam  Constitutional: She is oriented to person, place, and time. She appears well-developed and well-nourished. No distress.  HENT:  Head: Normocephalic and atraumatic.  Eyes: EOM are normal. Pupils are equal, round, and reactive to light.  Neck: Normal range of motion. Neck supple.  Cardiovascular: Normal rate and regular rhythm.  Exam reveals no gallop and no friction rub.   No murmur heard. Pulmonary/Chest: Effort normal. She has no wheezes. She has no rales.  Abdominal: Soft. She exhibits no distension. There is no tenderness. There is no rebound.  Musculoskeletal: She exhibits tenderness. She exhibits no edema.   Right-sided trapezius tenderness.  Mild right-sided grip strength weakness compared to left. Pulse motor and sensation intact distally  Neurological: She is alert and oriented to person, place, and time.  Skin: Skin is warm and dry.  She is not diaphoretic.  Psychiatric: She has a normal mood and affect. Her behavior is normal.  Nursing note and vitals reviewed.   ED Course  Procedures (including critical care time) Labs Review Labs Reviewed  BASIC METABOLIC PANEL - Abnormal; Notable for the following:    Glucose, Bld 146 (*)    All other components within normal limits  CBC - Abnormal; Notable for the following:    RBC 5.65 (*)    MCV 71.0 (*)    MCH 22.1 (*)    All other components within normal limits  URINALYSIS, ROUTINE W REFLEX MICROSCOPIC (NOT AT [facility]) - Abnormal; Notable for the following:    Specific Gravity, Urine 1.033 (*)    Glucose, UA >1000 (*)    Ketones, ur 15 (*)    All other components within normal limits  URINE MICROSCOPIC-ADD  ON - Abnormal; Notable for the following:    Squamous Epithelial / LPF FEW (*)    Bacteria, UA FEW (*)    All other components within normal limits  CBG MONITORING, ED - Abnormal; Notable for the following:    Glucose-Capillary 152 (*)    All other components within normal limits    Imaging Review No results found. I have personally reviewed and evaluated these images and lab results as part of my medical decision-making.   EKG Interpretation None      MDM   Final diagnoses:  Light-headed feeling  Trapezius strain, right, initial encounter    49 yo F  With a chief complaint of near syncope. Patient had unremarkable laboratory evaluation.  UA negative. Patient with some right-sided paresthesias associated with right trapezius pain. Feel likely trapezius spasm. We'll have her follow-up with neurology for EMG.  5:02 PM:  I have discussed the diagnosis/risks/treatment options with the patient and family and believe the pt to be eligible for discharge home to follow-up with PCP/neuro. We also discussed returning to the ED immediately if new or worsening sx occur. We discussed the sx which are most concerning (e.g., syncope, worsening weakness) that necessitate  immediate return. Medications administered to the patient during their visit and any new prescriptions provided to the patient are listed below.  Medications given during this visit Medications - No data to display  Discharge Medication List as of 07/16/2015  2:01 PM      The patient appears reasonably screen and/or stabilized for discharge and I doubt any other medical condition or other [facility] requiring further screening, evaluation, or treatment in the ED at this time prior to discharge.      Deno Etienne, DO 07/16/15 1702

## 2015-07-16 NOTE — ED Notes (Addendum)
Pt reports for two weeks she has been having episodes of hot flashes where she starts sweating with some dizziness and a feeling that she is going to pass out. Pt reports it gets better when she sits down and prays and it goes away. Pt concerned it might be related to her diabetes but her glucometer is dead and her husband forgot to buy her a new battery. CBG 150's at triage.  Denies these symptoms at present.

## 2015-07-16 NOTE — ED Notes (Signed)
Pt sts some dizziness x 2 weeks with right arm and neck pain; pt sts some diaphoresis last night

## 2015-07-18 ENCOUNTER — Other Ambulatory Visit: Payer: Self-pay | Admitting: *Deleted

## 2015-07-18 DIAGNOSIS — R202 Paresthesia of skin: Secondary | ICD-10-CM

## 2015-07-23 ENCOUNTER — Ambulatory Visit (INDEPENDENT_AMBULATORY_CARE_PROVIDER_SITE_OTHER): Payer: Managed Care, Other (non HMO) | Admitting: Neurology

## 2015-07-23 DIAGNOSIS — R202 Paresthesia of skin: Secondary | ICD-10-CM

## 2015-07-23 DIAGNOSIS — M5412 Radiculopathy, cervical region: Secondary | ICD-10-CM

## 2015-07-23 NOTE — Procedures (Signed)
[facility]  [place]  [place], [place] [address] Tel: [phone] Fax:  [phone] Test Date:  07/23/2015  Patient: Claudia [name] DOB: March 09, 1966 Physician: Narda Amber, DO  Sex: Female Height: 5\' 8"  Ref Phys: Deno Etienne  ID#: [id number] Temp: 33.3 Technician: M. Dean   Patient Complaints: This is a 49 year-old female with history of C5-C6 ACDF referred from the emergency department for evaluation of right shoulder pain that radiates into the arm and hand.  NCV & EMG Findings: Extensive electrodiagnostic testing of the right upper extremity shows: 1. Right median, ulnar, and palmar sensory responses are within normal limits. 2. Right median and ulnar motor responses are within normal limits. 3. Chronic motor axon loss changes are seen affecting the biceps, deltoid, and infraspinatus muscles, without accompanied active denervation.  Impression: 1. The electrophysiologic findings are most consistent with the residuals of an old C5-C6 radiculopathy affecting the right upper extremity. 2. There is no evidence of carpal tunnel syndrome or ulnar neuropathy.   ___________________________ Narda Amber, DO    Nerve Conduction Studies Anti Sensory Summary Table   Site NR Peak (ms) Norm Peak (ms) P-T Amp (V) Norm P-T Amp  Right Median Anti Sensory (2nd Digit)  33.3C  Wrist    3.3 <3.6 29.0 >15  Right Ulnar Anti Sensory (5th Digit)  33.3C  Wrist    2.9 <3.1 23.3 >10   Motor Summary Table   Site NR Onset (ms) Norm Onset (ms) O-P Amp (mV) Norm O-P Amp Site1 Site2 Delta-0 (ms) Dist (cm) Vel (m/s) Norm Vel (m/s)  Right Median Motor (Abd Poll Brev)  33.3C  Wrist    3.5 <4.0 9.2 >6 Elbow Wrist 4.5 26.0 58 >50  Elbow    8.0  8.8         Right Ulnar Motor (Abd Dig Minimi)  33.3C  Wrist    2.4 <3.1 8.3 >7 B Elbow Wrist 4.1 24.0 59 >50  B Elbow    6.5  8.2  A Elbow B Elbow 1.5 10.0 67 >50  A Elbow    8.0  7.6          Comparison Summary Table   Site NR Peak (ms) Norm Peak (ms) P-T Amp (V) Site1 Site2 Delta-P (ms) Norm Delta (ms)  Right Median/Ulnar Palm Comparison (Wrist - 8cm)  33.3C  Median Palm    2.0 <2.2 21.3 Median Palm Ulnar Palm 0.1   Ulnar Palm    2.1 <2.2 34.3       EMG   Side Muscle Ins Act Fibs Psw Fasc Number Recrt Dur Dur. Amp Amp. Poly Poly. Comment  Right 1stDorInt Nml Nml Nml Nml Nml Nml Nml Nml Nml Nml Nml Nml N/A  Right Ext Indicis Nml Nml Nml Nml Nml Nml Nml Nml Nml Nml Nml Nml N/A  Right PronatorTeres Nml Nml Nml Nml Nml Nml Nml Nml Nml Nml Nml Nml N/A  Right Biceps Nml Nml Nml Nml 1- Rapid Some 1+ Some 1+ Nml Nml N/A  Right Triceps Nml Nml Nml Nml Nml Nml Nml Nml Nml Nml Nml Nml N/A  Right Deltoid Nml Nml Nml Nml 2- Rapid Many 1+ Many 1+ Nml Nml N/A  Right Infraspinatus Nml Nml Nml Nml 1- Rapid Some 1+ Some 1+ Nml Nml N/A      Waveforms:

## 2015-08-05 ENCOUNTER — Telehealth: Payer: Self-pay | Admitting: Neurology

## 2015-08-05 NOTE — Telephone Encounter (Signed)
Notes faxed.

## 2015-08-05 NOTE — Telephone Encounter (Signed)
Pt called to request last visit documents to be fax to/ [facility]/ Dr Shanon Brow Swayne/Fax # [phone]

## 2015-11-25 ENCOUNTER — Encounter (HOSPITAL_COMMUNITY): Payer: Self-pay

## 2015-11-25 ENCOUNTER — Emergency Department (HOSPITAL_COMMUNITY): Payer: Managed Care, Other (non HMO)

## 2015-11-25 ENCOUNTER — Emergency Department (HOSPITAL_COMMUNITY)
Admission: EM | Admit: 2015-11-25 | Discharge: 2015-11-25 | Disposition: A | Discharge status: Home / Self Care | Payer: Managed Care, Other (non HMO) | Attending: Emergency Medicine | Admitting: Emergency Medicine

## 2015-11-25 DIAGNOSIS — E559 Vitamin D deficiency, unspecified: Secondary | ICD-10-CM | POA: Insufficient documentation

## 2015-11-25 DIAGNOSIS — M1990 Unspecified osteoarthritis, unspecified site: Secondary | ICD-10-CM | POA: Diagnosis not present

## 2015-11-25 DIAGNOSIS — Z7984 Long term (current) use of oral hypoglycemic drugs: Secondary | ICD-10-CM | POA: Insufficient documentation

## 2015-11-25 DIAGNOSIS — Z86018 Personal history of other benign neoplasm: Secondary | ICD-10-CM | POA: Insufficient documentation

## 2015-11-25 DIAGNOSIS — E119 Type 2 diabetes mellitus without complications: Secondary | ICD-10-CM | POA: Insufficient documentation

## 2015-11-25 DIAGNOSIS — M25512 Pain in left shoulder: Secondary | ICD-10-CM | POA: Diagnosis present

## 2015-11-25 DIAGNOSIS — E785 Hyperlipidemia, unspecified: Secondary | ICD-10-CM | POA: Diagnosis not present

## 2015-11-25 DIAGNOSIS — I10 Essential (primary) hypertension: Secondary | ICD-10-CM | POA: Diagnosis not present

## 2015-11-25 DIAGNOSIS — E7800 Pure hypercholesterolemia, unspecified: Secondary | ICD-10-CM | POA: Insufficient documentation

## 2015-11-25 DIAGNOSIS — Z79899 Other long term (current) drug therapy: Secondary | ICD-10-CM | POA: Diagnosis not present

## 2015-11-25 DIAGNOSIS — Z791 Long term (current) use of non-steroidal anti-inflammatories (NSAID): Secondary | ICD-10-CM | POA: Diagnosis not present

## 2015-11-25 MED ORDER — DEXAMETHASONE SODIUM PHOSPHATE 10 MG/ML IJ SOLN
6.0000 mg | Freq: Once | INTRAMUSCULAR | Status: AC
  Administered 2015-11-25: 6 mg via INTRAMUSCULAR
  Filled 2015-11-25: qty 1

## 2015-11-25 MED ORDER — PREDNISONE 20 MG PO TABS: 40.0000 mg | ORAL_TABLET | Freq: Every day | ORAL | Status: DC

## 2015-11-25 MED ORDER — TRAMADOL HCL 50 MG PO TABS: 50.0000 mg | ORAL_TABLET | Freq: Four times a day (QID) | ORAL | Status: DC | PRN

## 2015-11-25 MED ORDER — KETOROLAC TROMETHAMINE 60 MG/2ML IM SOLN
60.0000 mg | Freq: Once | INTRAMUSCULAR | Status: AC
  Administered 2015-11-25: 60 mg via INTRAMUSCULAR
  Filled 2015-11-25: qty 2

## 2015-11-25 MED ORDER — IBUPROFEN 800 MG PO TABS
800.0000 mg | ORAL_TABLET | Freq: Three times a day (TID) | ORAL | Status: DC
Start: 2015-11-25 — End: 2017-03-01

## 2015-11-25 NOTE — ED Provider Notes (Signed)
CSN: [id number]     Arrival date & time 11/25/15  0940 History   First MD Initiated Contact with Patient 11/25/15 1026     Chief Complaint  Patient presents with  . Shoulder Pain     (Consider location/radiation/quality/duration/timing/severity/associated sxs/prior Treatment) HPI   Pt is a 50 year-old female with pmhx of DM, HTN, HLD, osteoarthritis, presents to the ER with complaints of 1 month of worsening left should pain with limited ROM.  Pain is rated 9/10, described as achy and sharp, worse with movement and palpation, not improved with OTC medications.  She has pain every since she began a job one month ago working in [employer] where she does a lot of lifting and repetitive motions.  She denies redness, swelling, fever, chills, sweats.  She denies injury or strain.    Past Medical History  Diagnosis Date  . Diabetes mellitus   . Hypertension   . Hyperlipidemia   . Headache(784.0)   . Seizures ([place])     as child  . Hypercholesterolemia   . Fibroids   . Vitamin D deficiency   . Right knee pain   . Osteoarthritis     , with mild meniscus tear, followed by orthopedics--Dr Gladstone Lighter 04/29/10   Past Surgical History  Procedure Laterality Date  . Shoulder surgery Right 11    rotator cuff  . Laparoscopic assisted vaginal hysterectomy  1/14  . Umbilical hernia repair    . Tubal ligation    . Anterior cervical decomp/discectomy fusion N/A 09/07/2013    Procedure: ANTERIOR CERVICAL DECOMPRESSION/DISCECTOMY FUSION 1 LEVEL;  Surgeon: Sinclair Ship, MD;  Location: [facility];  Service: Orthopedics;  Laterality: N/A;  Anterior cervical decompression fusion, cervical 5-6 with instrumentation and allograft   Family History  Problem Relation Age of Onset  . Hypertension Mother   . Diabetes Mother   . Hypercholesterolemia Mother   . Hypertension Sister   . CVA Maternal Grandmother   . Hypertension Maternal Grandmother   . Diabetes Maternal Grandmother   . Heart attack Neg  Hx    Social History  Substance Use Topics  . Smoking status: Never Smoker   . Smokeless tobacco: Never Used  . Alcohol Use: No   OB History    Gravida Para Term Preterm AB TAB SAB Ectopic Multiple Living   4 4 3 1      3      Review of Systems  All other systems reviewed and are negative.     Allergies  Crestor; Hydrocodone; Other; Oxycodone; Pravachol; and Simvastatin  Home Medications   Prior to Admission medications   Medication Sig Start Date End Date Taking? Authorizing Provider  amLODipine (NORVASC) 10 MG tablet Take 10 mg by mouth daily.    Historical Provider, MD  carvedilol (COREG) 25 MG tablet TAKE ONE & ONE-HALF TABLETS BY MOUTH TWICE DAILY WITH  A  MEAL 05/16/15   Jerline Pain, MD  ferrous sulfate 325 (65 FE) MG tablet Take 325 mg by mouth 2 (two) times daily.    Historical Provider, MD  ibuprofen (ADVIL,MOTRIN) 800 MG tablet Take 1 tablet (800 mg total) by mouth 3 (three) times daily. 11/25/15   Delsa Grana, PA-C  JARDIANCE 25 MG TABS tablet Take 25 mg by mouth daily. 02/16/15   Historical Provider, MD  linagliptin (TRADJENTA) 5 MG TABS tablet Take 5 mg by mouth daily.    Historical Provider, MD  losartan (COZAAR) 100 MG tablet Take 100 mg by mouth daily.  Historical Provider, MD  meloxicam (MOBIC) 7.5 MG tablet Take 1 tablet (7.5 mg total) by mouth daily. 03/20/15   Noland Fordyce, PA-C  metFORMIN (GLUCOPHAGE) 1000 MG tablet Take 1,000 mg by mouth 2 (two) times daily with a meal.    Historical Provider, MD  Omega-3 Fatty Acids (FISH OIL PO) Take 360 mg by mouth daily.    Historical Provider, MD  predniSONE (DELTASONE) 20 MG tablet Take 2 tablets (40 mg total) by mouth daily. Take 40 mg by mouth daily for 3 days, then 20mg  by mouth daily for 3 days, then 10mg  daily for 3 days 11/25/15   Delsa Grana, PA-C  rosuvastatin (CRESTOR) 10 MG tablet Take 1 tablet (10 mg total) by mouth once a week. 02/21/14   Jerline Pain, MD  spironolactone (ALDACTONE) 25 MG tablet TAKE ONE  TABLET BY MOUTH ONCE DAILY 03/18/15   Jerline Pain, MD  traMADol (ULTRAM) 50 MG tablet Take 1 tablet (50 mg total) by mouth every 6 (six) hours as needed. 11/25/15   Delsa Grana, PA-C  Vitamin D, Ergocalciferol, (DRISDOL) 50000 UNITS CAPS capsule Take 50,000 Units by mouth every 7 (seven) days. Mondays    Historical Provider, MD   BP 153/97 mmHg  Pulse 81  Temp(Src) 98.1 F (36.7 C) (Oral)  Resp 18  Ht 5\' 8"  (1.727 m)  Wt 134.718 kg  BMI 45.17 kg/m2  SpO2 98%  LMP 05/09/2012 Physical Exam  Constitutional: She is oriented to person, place, and time. She appears well-developed and well-nourished. No distress.  HENT:  Head: Normocephalic and atraumatic.  Right Ear: External ear normal.  Left Ear: External ear normal.  Nose: Nose normal.  Mouth/Throat: Oropharynx is clear and moist. No oropharyngeal exudate.  Eyes: Conjunctivae and EOM are normal. Pupils are equal, round, and reactive to light. Right eye exhibits no discharge. Left eye exhibits no discharge. No scleral icterus.  Neck: Normal range of motion. Neck supple. No JVD present. No tracheal deviation present.  Cardiovascular: Normal rate and regular rhythm.   Pulmonary/Chest: Effort normal and breath sounds normal. No stridor. No respiratory distress.  Musculoskeletal: She exhibits tenderness. She exhibits no edema.       Left shoulder: She exhibits decreased range of motion, tenderness and bony tenderness. She exhibits no swelling, no effusion, no crepitus, no spasm, normal pulse and normal strength.  Lymphadenopathy:    She has no cervical adenopathy.  Neurological: She is alert and oriented to person, place, and time. She exhibits normal muscle tone. Coordination normal.  Skin: Skin is warm and dry. No rash noted. She is not diaphoretic. No erythema. No pallor.  Psychiatric: She has a normal mood and affect. Her behavior is normal. Judgment and thought content normal.  Nursing note and vitals reviewed.   ED Course   Procedures (including critical care time) Labs Review Labs Reviewed - No data to display  Imaging Review Dg Shoulder Left  11/25/2015  CLINICAL DATA:  No known injury, limited range of motion and pain x 1 month in left shoulder EXAM: LEFT SHOULDER - 2+ VIEW COMPARISON:  None. FINDINGS: Glenohumeral joint is intact. No evidence of scapular fracture or humeral fracture. The acromioclavicular joint is intact. Spurring at the acromioclavicular joint IMPRESSION: No fracture or dislocation. Electronically Signed   By: Suzy Bouchard M.D.   On: 11/25/2015 10:59   I have personally reviewed and evaluated these images and lab results as part of my medical decision-making.   EKG Interpretation None  MDM   Pt with left shoulder pain x 1 month, pain reproducible with palpation to glenohumeral joint mildly limited ROM. No erythema, no concern for septic arthritis.   Xray negative for fracture or dislocation.  Pt given toradol, decadron and sling in the ER.  Pt sees ortho for other joint issues, she will try to follow up with him.  Ortho referral given.   Pt d/c with pain meds,    Final diagnoses:  Shoulder pain, left        Delsa Grana, PA-C 12/01/15 0034  Nat Christen, MD 12/09/15 1510

## 2015-11-25 NOTE — Discharge Instructions (Signed)
Shoulder Pain The shoulder is the joint that connects your arms to your body. The bones that form the shoulder joint include the upper arm bone (humerus), the shoulder blade (scapula), and the collarbone (clavicle). The top of the humerus is shaped like a ball and fits into a rather flat socket on the scapula (glenoid cavity). A combination of muscles and strong, fibrous tissues that connect muscles to bones (tendons) support your shoulder joint and hold the ball in the socket. Small, fluid-filled sacs (bursae) are located in different areas of the joint. They act as cushions between the bones and the overlying soft tissues and help reduce friction between the gliding tendons and the bone as you move your arm. Your shoulder joint allows a wide range of motion in your arm. This range of motion allows you to do things like scratch your back or throw a ball. However, this range of motion also makes your shoulder more prone to pain from overuse and injury. Causes of shoulder pain can originate from both injury and overuse and usually can be grouped in the following four categories:  Redness, swelling, and pain (inflammation) of the tendon (tendinitis) or the bursae (bursitis).  Instability, such as a dislocation of the joint.  Inflammation of the joint (arthritis).  Broken bone (fracture). HOME CARE INSTRUCTIONS   Apply ice to the sore area.  Put ice in a plastic bag.  Place a towel between your skin and the bag.  Leave the ice on for 15-20 minutes, 3-4 times per day for the first 2 days, or as directed by your health care provider.  Stop using cold packs if they do not help with the pain.  If you have a shoulder sling or immobilizer, wear it as long as your caregiver instructs. Only remove it to shower or bathe. Move your arm as little as possible, but keep your hand moving to prevent swelling.  Squeeze a soft ball or foam pad as much as possible to help prevent swelling.  Only take  over-the-counter or prescription medicines for pain, discomfort, or fever as directed by your caregiver. SEEK MEDICAL CARE IF:   Your shoulder pain increases, or new pain develops in your arm, hand, or fingers.  Your hand or fingers become cold and numb.  Your pain is not relieved with medicines. SEEK IMMEDIATE MEDICAL CARE IF:   Your arm, hand, or fingers are numb or tingling.  Your arm, hand, or fingers are significantly swollen or turn white or blue. MAKE SURE YOU:   Understand these instructions.  Will watch your condition.  Will get help right away if you are not doing well or get worse.   This information is not intended to replace advice given to you by your health care provider. Make sure you discuss any questions you have with your health care provider.   Document Released: 06/24/2005 Document Revised: 10/05/2014 Document Reviewed: 01/07/2015 Elsevier Interactive Patient Education 2016 Elsevier Inc.  Cryotherapy Cryotherapy is when you put ice on your injury. Ice helps lessen pain and puffiness (swelling) after an injury. Ice works the best when you start using it in the first 24 to 48 hours after an injury. HOME CARE  Put a dry or damp towel between the ice pack and your skin.  You may press gently on the ice pack.  Leave the ice on for no more than 10 to 20 minutes at a time.  Check your skin after 5 minutes to make sure your skin is okay.  Rest at least 20 minutes between ice pack uses.  Stop using ice when your skin loses feeling (numbness).  Do not use ice on someone who cannot tell you when it hurts. This includes small children and people with memory problems (dementia). GET HELP RIGHT AWAY IF:  You have white spots on your skin.  Your skin turns blue or pale.  Your skin feels waxy or hard.  Your puffiness gets worse. MAKE SURE YOU:   Understand these instructions.  Will watch your condition.  Will get help right away if you are not doing  well or get worse.   This information is not intended to replace advice given to you by your health care provider. Make sure you discuss any questions you have with your health care provider.   Document Released: 03/02/2008 Document Revised: 12/07/2011 Document Reviewed: 05/07/2011 Elsevier Interactive Patient Education 2016 [place] therapy can help ease sore, stiff, injured, and tight muscles and joints. Heat relaxes your muscles, which may help ease your pain. Heat therapy should only be used on old, pre-existing, or long-lasting (chronic) injuries. Do not use heat therapy unless told by your doctor. HOW TO USE HEAT THERAPY There are several different kinds of heat therapy, including:  Moist heat pack.  Warm water bath.  Hot water bottle.  Electric heating pad.  Heated gel pack.  Heated wrap.  Electric heating pad. GENERAL HEAT THERAPY RECOMMENDATIONS   Do not sleep while using heat therapy. Only use heat therapy while you are awake.  Your skin may turn pink while using heat therapy. Do not use heat therapy if your skin turns red.  Do not use heat therapy if you have new pain.  High heat or long exposure to heat can cause burns. Be careful when using heat therapy to avoid burning your skin.  Do not use heat therapy on areas of your skin that are already irritated, such as with a rash or sunburn. GET HELP IF:   You have blisters, redness, swelling (puffiness), or numbness.  You have new pain.  Your pain is worse. MAKE SURE YOU:  Understand these instructions.  Will watch your condition.  Will get help right away if you are not doing well or get worse.   This information is not intended to replace advice given to you by your health care provider. Make sure you discuss any questions you have with your health care provider.   Document Released: 12/07/2011 Document Revised: 10/05/2014 Document Reviewed: 11/07/2013 Elsevier Interactive  Patient Education [employer].

## 2015-11-25 NOTE — ED Notes (Signed)
Patient c/o intermittent left shoulder pain x 1 month. Patient states movement and lifting increases the pain.

## 2016-01-29 ENCOUNTER — Encounter: Payer: Self-pay | Admitting: Cardiology

## 2016-03-09 ENCOUNTER — Ambulatory Visit: Payer: Managed Care, Other (non HMO) | Admitting: Cardiology

## 2016-03-12 ENCOUNTER — Ambulatory Visit: Payer: Managed Care, Other (non HMO) | Admitting: Cardiology

## 2016-04-01 ENCOUNTER — Other Ambulatory Visit: Payer: Self-pay | Admitting: Cardiology

## 2016-04-16 ENCOUNTER — Encounter: Payer: Self-pay | Admitting: Cardiology

## 2016-04-28 ENCOUNTER — Telehealth: Payer: Self-pay | Admitting: Cardiology

## 2016-04-28 ENCOUNTER — Encounter: Payer: Self-pay | Admitting: Cardiology

## 2016-04-28 ENCOUNTER — Encounter (INDEPENDENT_AMBULATORY_CARE_PROVIDER_SITE_OTHER): Payer: Self-pay

## 2016-04-28 ENCOUNTER — Ambulatory Visit (INDEPENDENT_AMBULATORY_CARE_PROVIDER_SITE_OTHER): Payer: Managed Care, Other (non HMO) | Admitting: Cardiology

## 2016-04-28 ENCOUNTER — Other Ambulatory Visit: Payer: Self-pay | Admitting: Cardiology

## 2016-04-28 VITALS — BP 132/78 | HR 83 | Ht 67.0 in | Wt 296.0 lb

## 2016-04-28 DIAGNOSIS — E785 Hyperlipidemia, unspecified: Secondary | ICD-10-CM

## 2016-04-28 DIAGNOSIS — I429 Cardiomyopathy, unspecified: Secondary | ICD-10-CM | POA: Diagnosis not present

## 2016-04-28 DIAGNOSIS — I10 Essential (primary) hypertension: Secondary | ICD-10-CM | POA: Diagnosis not present

## 2016-04-28 DIAGNOSIS — I110 Hypertensive heart disease with heart failure: Secondary | ICD-10-CM

## 2016-04-28 MED ORDER — ROSUVASTATIN CALCIUM 10 MG PO TABS: 10.0000 mg | ORAL_TABLET | ORAL | 3 refills | Status: DC

## 2016-04-28 MED ORDER — AMLODIPINE BESYLATE 10 MG PO TABS: 10.0000 mg | ORAL_TABLET | Freq: Every day | ORAL | 3 refills | Status: DC

## 2016-04-28 MED ORDER — CARVEDILOL 25 MG PO TABS
ORAL_TABLET | ORAL | 3 refills | Status: DC
Start: 2016-04-28 — End: 2017-02-19

## 2016-04-28 MED ORDER — LOSARTAN POTASSIUM 100 MG PO TABS: 100.0000 mg | ORAL_TABLET | Freq: Every day | ORAL | 3 refills | Status: DC

## 2016-04-28 MED ORDER — SPIRONOLACTONE 25 MG PO TABS: 25.0000 mg | ORAL_TABLET | Freq: Every day | ORAL | 3 refills | Status: DC

## 2016-04-28 NOTE — Telephone Encounter (Signed)
Allred is calling from [facility] needs clarification on the directions for  Generic Crestor . Please call  Thanks

## 2016-04-28 NOTE — Telephone Encounter (Signed)
Noted  

## 2016-04-28 NOTE — Progress Notes (Signed)
[facility]. [address], [address] [place], [place]  [address] Phone: [phone] Fax:  [phone]  Date:  04/28/2016   ID:  Julliette Hageman, DOB Jan 03, 1966, MRN [id number]  PCP:  Gara Kroner, MD   History of Present Illness: Claudia [name] is a 50 y.o. female with hypertension, diabetes, hyperlipidemia, cardiomyopathy, systolic LV dysfunction ejection fraction 40-45% with moderate LVH in the setting of hypertensive urgency now with normal ejection fraction 55% on echocardiogram 2016. Blood pressure previously has been difficult to control.  Weight loss previously 10 pounds challenging.. At [facility] hospital 2014 with HTN urgency. Was off BP meds at the time. Felt like tons of weight on chest and head. Got her back on meds. Improved. Concerned about cardiomegally. Many of her blood pressures at home have been in normal range. At previous physician visit, normal. It is quite challenging to hear her blood pressure effectively manual.  Echocardiogram in the hospital setting showed EF 40-45% with moderate LVH. Repeat echocardiogram in 2016 showed normal EF.   She currently states that she feels much better, no significant shortness of breath anymore, only mild with exertion. Her shortness of breath was likely related to hypertensive urgency and her decreased ejection fraction also related to hypertension.   I have reviewed Dr. Moreen Fowler note. In regards to statin intolerance, He does state in his note that there was a trial of Crestor once weekly however she reports that she did not take it as such, sometimes skipping a week. Overall though she is taking it occasionally.. Hemoglobin A1c 10.7 in the past.  In ER for shoulder pain 11/25/15.    Wt Readings from Last 3 Encounters:  04/28/16 296 lb (134.3 kg)  11/25/15 297 lb (134.7 kg)  07/16/15 278 lb (126.1 kg)     Past Medical History:  Diagnosis Date  . Diabetes mellitus   . Fibroids   . Headache(784.0)   . Hypercholesterolemia   . Hyperlipidemia    . Hypertension   . Osteoarthritis    , with mild meniscus tear, followed by orthopedics--Dr Gladstone Lighter 04/29/10  . Right knee pain   . Seizures ([place])    as child  . Vitamin D deficiency     Past Surgical History:  Procedure Laterality Date  . ANTERIOR CERVICAL DECOMP/DISCECTOMY FUSION N/A 09/07/2013   Procedure: ANTERIOR CERVICAL DECOMPRESSION/DISCECTOMY FUSION 1 LEVEL;  Surgeon: Sinclair Ship, MD;  Location: [facility];  Service: Orthopedics;  Laterality: N/A;  Anterior cervical decompression fusion, cervical 5-6 with instrumentation and allograft  . LAPAROSCOPIC ASSISTED VAGINAL HYSTERECTOMY  1/14  . SHOULDER SURGERY Right 11   rotator cuff  . TUBAL LIGATION    . UMBILICAL HERNIA REPAIR      Current Outpatient Prescriptions  Medication Sig Dispense Refill  . amLODipine (NORVASC) 10 MG tablet Take 1 tablet (10 mg total) by mouth daily. 90 tablet 3  . carvedilol (COREG) 25 MG tablet Take 1.5 tablets by mouth twice daily 270 tablet 3  . ferrous sulfate 325 (65 FE) MG tablet Take 325 mg by mouth 2 (two) times daily.    Marland Kitchen ibuprofen (ADVIL,MOTRIN) 800 MG tablet Take 1 tablet (800 mg total) by mouth 3 (three) times daily. 60 tablet 0  . JARDIANCE 25 MG TABS tablet Take 25 mg by mouth daily.    Marland Kitchen linagliptin (TRADJENTA) 5 MG TABS tablet Take 5 mg by mouth daily.    Marland Kitchen losartan (COZAAR) 100 MG tablet Take 1 tablet (100 mg total) by mouth daily.  90 tablet 3  . meloxicam (MOBIC) 7.5 MG tablet Take 1 tablet (7.5 mg total) by mouth daily. 14 tablet 0  . metFORMIN (GLUCOPHAGE) 1000 MG tablet Take 1,000 mg by mouth 2 (two) times daily with a meal.    . Omega-3 Fatty Acids (FISH OIL PO) Take 360 mg by mouth daily.    . predniSONE (DELTASONE) 20 MG tablet Take 2 tablets (40 mg total) by mouth daily. Take 40 mg by mouth daily for 3 days, then 20mg  by mouth daily for 3 days, then 10mg  daily for 3 days 12 tablet 0  . rosuvastatin (CRESTOR) 10 MG tablet Take 1 tablet (10 mg total) by mouth once a week.  90 tablet 3  . spironolactone (ALDACTONE) 25 MG tablet Take 1 tablet (25 mg total) by mouth daily. 90 tablet 3  . traMADol (ULTRAM) 50 MG tablet Take 1 tablet (50 mg total) by mouth every 6 (six) hours as needed. 20 tablet 0  . Vitamin D, Ergocalciferol, (DRISDOL) 50000 UNITS CAPS capsule Take 50,000 Units by mouth every 7 (seven) days. Mondays     No current facility-administered medications for this visit.     Allergies:    Allergies  Allergen Reactions  . Crestor [Rosuvastatin]     MUSCLE ACHES HEADACHES   . Hydrocodone Itching  . Other Hives, Swelling and Other (See Comments)    Vision changes; ALLERGIC TO ALL NUTS EXCEPT PEANUTS  . Oxycodone Itching  . Pravachol [Pravastatin Sodium] Itching    Joint pain  . Simvastatin Hives    Social History:  The patient  reports that she has never smoked. She has never used smokeless tobacco. She reports that she does not drink alcohol or use drugs.   ROS:  Please see the history of present illness.   No strokelike symptoms, no fevers, no chills, no chest pain, no shortness of breath    PHYSICAL EXAM: VS:  BP 132/78   Pulse 83   Ht 5\' 7"  (1.702 m)   Wt 296 lb (134.3 kg)   LMP 05/09/2012   BMI 46.36 kg/m  Well nourished, well developed, in no acute distress  HEENT: normal  Neck: no JVD  Cardiac:  normal S1, S2; RRR; no murmur  Lungs:  clear to auscultation bilaterally, no wheezing, rhonchi or rales  Abd: soft, nontender, no hepatomegaly obese Ext: no edema  Skin: warm and dry  Neuro: no focal abnormalities noted  EKG:  EKG ordered today 04/28/16-normal sinus rhythm 83 vertical axis borderline low voltage.     ECHO 03/04/15: - Left ventricle: The cavity size was normal. Systolic function was 0000000   normal. Wall motion was normal; there were no regional wall   motion abnormalities. There was an increased relative   contribution of atrial contraction to ventricular filling.   Doppler parameters are consistent with abnormal left  ventricular   relaxation (grade 1 diastolic dysfunction). - Atrial septum: No defect or patent foramen ovale was identified.  I have reviewed office notes from Dr. Laqueta Linden visit 04/21/16.  ASSESSMENT AND PLAN:  1. Cardiomyopathy-most recent echocardiogram from 2016 after effective medical therapy showed normalization of her left ventricular ejection fraction 55%. Excellent. echocardiogram from 06/02/12 showed ejection fraction in the 40-45% range. Now in the normal range. Excellent. 2. Hypertension/hypertensive heart disease with heart failure-uncontrolled previously. She states that she is taking her medications but occasionally will miss them. Expressed the importance of obtaining her medications. Spironolactone 25 mg once a day. Carvedilol 25 mg twice a  day. Occasionally, the addition of spironolactone will help out significantly with uncontrolled or difficult to control hypertension. Previous basic metabolic profile has been within normal range, no evidence of hyperkalemia.  3. Morbid obesity-continue to encourage weight loss. This can significantly help with both her diabetes as well as hypertension.  Hopefully she will be able to be active enough to continue to lose weight. She hurt her shoulder at a previous job. 4. Diabetes-this will improve with weight loss. 5. Statin intolerance- Crestor 10 mg once weekly. She has had prior myalgias/joint discomfort on once daily Crestor, pravastatin as well.  6. She requested one year follow-up. This seems reasonable especially if Dr. Moreen Fowler is monitoring as well.  Signed, Candee Furbish, MD [facility]  04/28/2016 9:04 AM

## 2016-04-28 NOTE — Telephone Encounter (Signed)
New message        Pt c/o medication issue:  1. Name of Medication: lorsartan and spironlactone  2. How are you currently taking this medication (dosage and times per day)? 100 mg and 25 mg   3. Are you having a reaction (difficulty breathing--STAT)? no  4. What is your medication issue? The p[harmacy tech is stating if used together it will cause it hyperglycemia just wanting the MD aware

## 2016-04-28 NOTE — Telephone Encounter (Signed)
[facility] and the pharmacist said she had already taken call of this.  She didn't know who or why this call was received.

## 2016-04-28 NOTE — Telephone Encounter (Signed)
Okay to take both. Candee Furbish, MD

## 2016-04-28 NOTE — Patient Instructions (Signed)

## 2017-01-16 ENCOUNTER — Emergency Department (HOSPITAL_COMMUNITY): Payer: Managed Care, Other (non HMO)

## 2017-01-16 ENCOUNTER — Emergency Department (HOSPITAL_COMMUNITY)
Admission: EM | Admit: 2017-01-16 | Discharge: 2017-01-16 | Disposition: A | Discharge status: Home / Self Care | Payer: Managed Care, Other (non HMO) | Attending: Emergency Medicine | Admitting: Emergency Medicine

## 2017-01-16 ENCOUNTER — Encounter (HOSPITAL_COMMUNITY): Payer: Self-pay

## 2017-01-16 ENCOUNTER — Emergency Department (HOSPITAL_BASED_OUTPATIENT_CLINIC_OR_DEPARTMENT_OTHER)
Admit: 2017-01-16 | Discharge: 2017-01-16 | Disposition: A | Discharge status: Home / Self Care | Payer: Managed Care, Other (non HMO)

## 2017-01-16 DIAGNOSIS — E119 Type 2 diabetes mellitus without complications: Secondary | ICD-10-CM | POA: Insufficient documentation

## 2017-01-16 DIAGNOSIS — I10 Essential (primary) hypertension: Secondary | ICD-10-CM | POA: Diagnosis not present

## 2017-01-16 DIAGNOSIS — M79605 Pain in left leg: Secondary | ICD-10-CM

## 2017-01-16 DIAGNOSIS — Z79899 Other long term (current) drug therapy: Secondary | ICD-10-CM | POA: Insufficient documentation

## 2017-01-16 DIAGNOSIS — Z7984 Long term (current) use of oral hypoglycemic drugs: Secondary | ICD-10-CM | POA: Insufficient documentation

## 2017-01-16 LAB — BASIC METABOLIC PANEL
ANION GAP: 10 (ref 5–15)
BUN: 11 mg/dL (ref 6–20)
CHLORIDE: 99 mmol/L — AB (ref 101–111)
CO2: 27 mmol/L (ref 22–32)
CREATININE: 0.63 mg/dL (ref 0.44–1.00)
Calcium: 9.7 mg/dL (ref 8.9–10.3)
GFR calc non Af Amer: >60 mL/min (ref >60)
Glucose, Bld: 151 mg/dL — ABNORMAL HIGH (ref 65–99)
Potassium: 4.4 mmol/L (ref 3.5–5.1)
SODIUM: 136 mmol/L (ref 135–145)

## 2017-01-16 LAB — CBC WITH DIFFERENTIAL/PLATELET
BASOS ABS: 0 10*3/uL (ref 0.0–0.1)
BASOS PCT: 0 %
EOS ABS: 0.2 10*3/uL (ref 0.0–0.7)
Eosinophils Relative: 3 %
HEMATOCRIT: 39.6 % (ref 36.0–46.0)
HEMOGLOBIN: 12.5 g/dL (ref 12.0–15.0)
Lymphocytes Relative: 42 %
Lymphs Abs: 3.2 10*3/uL (ref 0.7–4.0)
MCH: 22.2 pg — ABNORMAL LOW (ref 26.0–34.0)
MCHC: 31.6 g/dL (ref 30.0–36.0)
MCV: 70.5 fL — ABNORMAL LOW (ref 78.0–100.0)
Monocytes Absolute: 0.5 10*3/uL (ref 0.1–1.0)
Monocytes Relative: 6 %
NEUTROS ABS: 3.7 10*3/uL (ref 1.7–7.7)
NEUTROS PCT: 49 %
Platelets: 364 10*3/uL (ref 150–400)
RBC: 5.62 MIL/uL — AB (ref 3.87–5.11)
RDW: 15.3 % (ref 11.5–15.5)
WBC: 7.6 10*3/uL (ref 4.0–10.5)

## 2017-01-16 MED ORDER — MELOXICAM 7.5 MG PO TABS: 15.0000 mg | ORAL_TABLET | Freq: Every day | ORAL | 0 refills | Status: DC

## 2017-01-16 NOTE — ED Provider Notes (Signed)
[facility] DEPT Provider Note   CSN: [id number] Arrival date & time: 01/16/17  1631     History   Chief Complaint No chief complaint on file.   HPI Claudia [name] is a 51 y.o. female.  The history is provided by the patient and medical records.    51 y.o. F with hx of DM, fibroids, headaches, HLP, HTN, osteoarthritis, seizure disorder in childhood, vit D deficiency, presenting to the ED for left leg pain.  Patient reports this has been ongoing for the past month or so.  States pain is worse along the back and lateral edge of her left calf.  States she does have some "popping and clicking" in the left knee as well.  Pain is worse with weight bearing and ambulation.  She denies any known injury, trauma, or falls.  States her left leg is usually the "good leg" since she has hx of prior right leg injuries.  Denies hx of DVT.  No recent travel, exogenous estrogens, trauma, or surgeries.  No hx of DVT or PE.  Denies chest pain or SOB.  Past Medical History:  Diagnosis Date  . Diabetes mellitus   . Fibroids   . Headache(784.0)   . Hypercholesterolemia   . Hyperlipidemia   . Hypertension   . Osteoarthritis    , with mild meniscus tear, followed by orthopedics--Dr Gladstone Lighter 04/29/10  . Right knee pain   . Seizures ([place])    as child  . Vitamin D deficiency     Patient Active Problem List   Diagnosis Date Noted  . Cardiomyopathy ([place]) 03/16/2014  . Radiculopathy 09/07/2013  . Hypokalemia 06/02/2012  . HTN (hypertension) 06/01/2012  . DM (diabetes mellitus) ([place]) 06/01/2012  . Hyperlipidemia 06/01/2012  . DOE (dyspnea on exertion) 06/01/2012  . Obesity 06/01/2012    Past Surgical History:  Procedure Laterality Date  . ANTERIOR CERVICAL DECOMP/DISCECTOMY FUSION N/A 09/07/2013   Procedure: ANTERIOR CERVICAL DECOMPRESSION/DISCECTOMY FUSION 1 LEVEL;  Surgeon: Sinclair Ship, MD;  Location: [facility];  Service: Orthopedics;  Laterality: N/A;  Anterior cervical decompression fusion,  cervical 5-6 with instrumentation and allograft  . LAPAROSCOPIC ASSISTED VAGINAL HYSTERECTOMY  1/14  . SHOULDER SURGERY Right 11   rotator cuff  . TUBAL LIGATION    . UMBILICAL HERNIA REPAIR      OB History    Gravida Para Term Preterm AB Living   4 4 3 1   3    SAB TAB Ectopic Multiple Live Births                   Home Medications    Prior to Admission medications   Medication Sig Start Date End Date Taking? Authorizing Provider  amLODipine (NORVASC) 10 MG tablet Take 1 tablet (10 mg total) by mouth daily. 04/28/16   Jerline Pain, MD  carvedilol (COREG) 25 MG tablet Take 1.5 tablets by mouth twice daily 04/28/16   Jerline Pain, MD  ferrous sulfate 325 (65 FE) MG tablet Take 325 mg by mouth 2 (two) times daily.    Historical Provider, MD  ibuprofen (ADVIL,MOTRIN) 800 MG tablet Take 1 tablet (800 mg total) by mouth 3 (three) times daily. 11/25/15   Delsa Grana, PA-C  JARDIANCE 25 MG TABS tablet Take 25 mg by mouth daily. 02/16/15   Historical Provider, MD  linagliptin (TRADJENTA) 5 MG TABS tablet Take 5 mg by mouth daily.    Historical Provider, MD  losartan (COZAAR) 100 MG tablet Take 1 tablet (100 mg  total) by mouth daily. 04/28/16   Jerline Pain, MD  meloxicam (MOBIC) 7.5 MG tablet Take 1 tablet (7.5 mg total) by mouth daily. 03/20/15   Noland Fordyce, PA-C  metFORMIN (GLUCOPHAGE) 1000 MG tablet Take 1,000 mg by mouth 2 (two) times daily with a meal.    Historical Provider, MD  Omega-3 Fatty Acids (FISH OIL PO) Take 360 mg by mouth daily.    Historical Provider, MD  predniSONE (DELTASONE) 20 MG tablet Take 2 tablets (40 mg total) by mouth daily. Take 40 mg by mouth daily for 3 days, then 20mg  by mouth daily for 3 days, then 10mg  daily for 3 days 11/25/15   Delsa Grana, PA-C  rosuvastatin (CRESTOR) 10 MG tablet Take 1 tablet (10 mg total) by mouth once a week. 04/28/16   Jerline Pain, MD  spironolactone (ALDACTONE) 25 MG tablet Take 1 tablet (25 mg total) by mouth daily. 04/28/16   Jerline Pain, MD  traMADol (ULTRAM) 50 MG tablet Take 1 tablet (50 mg total) by mouth every 6 (six) hours as needed. 11/25/15   Delsa Grana, PA-C  Vitamin D, Ergocalciferol, (DRISDOL) 50000 UNITS CAPS capsule Take 50,000 Units by mouth every 7 (seven) days. Mondays    Historical Provider, MD    Family History Family History  Problem Relation Age of Onset  . Hypertension Mother   . Diabetes Mother   . Hypercholesterolemia Mother   . Hypertension Sister   . CVA Maternal Grandmother   . Hypertension Maternal Grandmother   . Diabetes Maternal Grandmother   . Heart attack Neg Hx     Social History Social History  Substance Use Topics  . Smoking status: Never Smoker  . Smokeless tobacco: Never Used  . Alcohol use No     Allergies   Crestor [rosuvastatin]; Hydrocodone; Other; Oxycodone; Pravachol [pravastatin sodium]; and Simvastatin   Review of Systems Review of Systems  Musculoskeletal: Positive for arthralgias and myalgias.  All other systems reviewed and are negative.    Physical Exam Updated Vital Signs BP (!) 161/119 (BP Location: Left Arm)   Pulse 92   Temp 97.4 F (36.3 C) (Oral)   Resp 18   LMP 05/09/2012   SpO2 94%   Physical Exam  Constitutional: She is oriented to person, place, and time. She appears well-developed and well-nourished.  HENT:  Head: Normocephalic and atraumatic.  Mouth/Throat: Oropharynx is clear and moist.  Eyes: Conjunctivae and EOM are normal. Pupils are equal, round, and reactive to light.  Neck: Normal range of motion.  Cardiovascular: Normal rate, regular rhythm and normal heart sounds.   Pulmonary/Chest: Effort normal and breath sounds normal. No respiratory distress. She has no wheezes.  Abdominal: Soft. Bowel sounds are normal.  Musculoskeletal: Normal range of motion.  Left leg overall normal in appearance; no significant calf asymmetry, tenderness, or palpable cords; no overlying skin changes or warmth to touch; no tissue crepitus;  there is some tenderness along proximal and lateral calf; DP pulse intact, moving all toes normally, normal grip strength  Neurological: She is alert and oriented to person, place, and time.  Skin: Skin is warm and dry.  Psychiatric: She has a normal mood and affect.  Nursing note and vitals reviewed.    ED Treatments / Results  Labs (all labs ordered are listed, but only abnormal results are displayed) Labs Reviewed  CBC WITH DIFFERENTIAL/PLATELET - Abnormal; Notable for the following:       Result Value   RBC 5.62 (*)  MCV 70.5 (*)    MCH 22.2 (*)    All other components within normal limits  BASIC METABOLIC PANEL - Abnormal; Notable for the following:    Chloride 99 (*)    Glucose, Bld 151 (*)    All other components within normal limits    EKG  EKG Interpretation None       Radiology Dg Knee Complete 4 Views Left  Result Date: 01/16/2017 CLINICAL DATA:  51 y/o  F; left knee popping and left knee pain. EXAM: LEFT KNEE - COMPLETE 4+ VIEW COMPARISON:  07/2007 left knee radiographs. FINDINGS: Mild progression of osteoarthrosis of the knee with mild to moderate medial femorotibial compartment joint space narrowing and tricompartmental osteophytes. No joint effusion. No acute fracture identified. IMPRESSION: Mild to moderate osteoarthrosis of the knee greatest in the medial femorotibial compartment mildly progressed from 2008. Electronically Signed   By: Kristine Garbe M.D.   On: 01/16/2017 19:20   VASCULAR LAB PRELIMINARY  PRELIMINARY  PRELIMINARY  PRELIMINARY  Left lower extremity venous duplex completed.    Preliminary report:  There is no DVT, SVT, or Baker's cyst noted in the left lower extremity.  Called report to Dr. Lawanda Cousins, [facility], RVT 01/16/2017, 7:29 PM  Procedures Procedures (including critical care time)  Medications Ordered in ED Medications - No data to display   Initial Impression / Assessment and Plan / ED Course  I have  reviewed the triage vital signs and the nursing notes.  Pertinent labs & imaging results that were available during my care of the patient were reviewed by me and considered in my medical decision making (see chart for details).  51 y.o. F here with left leg pain x 1 month.  Worse in the calf but does report some "popping" and "clicking" in her right knee.  She is afebrile, non-toxic.  No significant calf assymetry, tenderness, or palpable cords noted.  No overlying skin changes.  No tissue crepitus.  Not clinically concerning for septic joint or cellulitis.  Labs obtained from triage and are reassuring.  Will obtain venous duplex and plain film of the knee.  Venous duplex negative for DVT.  X-ray does reveal arthritic changes of the left knee, worse along the medial femotibial compartment.  This may be source of her pain. Patient also seems to distribute most of her weight when walking per history which may be contirbuting as well.  Will refer back to orthopedics-- she sees [facility].    Will start mobic.  Discussed plan with patient, she acknowledged understanding and agreed with plan of care.  Return precautions given for new or worsening symptoms.  Final Clinical Impressions(s) / ED Diagnoses   Final diagnoses:  Left leg pain    New Prescriptions Discharge Medication List as of 01/16/2017  9:45 PM       Larene Pickett, PA-C 01/16/17 2249    Duffy Bruce, MD 01/17/17 [phone]

## 2017-01-16 NOTE — Progress Notes (Signed)
VASCULAR LAB PRELIMINARY  PRELIMINARY  PRELIMINARY  PRELIMINARY  Left lower extremity venous duplex completed.    Preliminary report:  There is no DVT, SVT, or Baker's cyst noted in the left lower extremity.  Called report to Dr. Lawanda Cousins, [facility], RVT 01/16/2017, 7:29 PM

## 2017-01-16 NOTE — ED Triage Notes (Signed)
Patient complains of left lower leg pain x 1 month. States pain worse with movement and has had some swelling with same. Denies any trauma. NAD

## 2017-01-16 NOTE — Discharge Instructions (Signed)
Take the prescribed medication as directed.  Can wear knee sleeve for extra support.  May wish to elevate your leg at home to help with pain.  Can use ice as well. Follow-up with your orthopedist. Return to the ED for new or worsening symptoms.

## 2017-02-19 ENCOUNTER — Emergency Department (HOSPITAL_COMMUNITY): Payer: Managed Care, Other (non HMO)

## 2017-02-19 ENCOUNTER — Encounter (HOSPITAL_COMMUNITY): Payer: Self-pay | Admitting: Nurse Practitioner

## 2017-02-19 ENCOUNTER — Emergency Department (HOSPITAL_COMMUNITY)
Admission: EM | Admit: 2017-02-19 | Discharge: 2017-02-19 | Disposition: A | Discharge status: Home / Self Care | Payer: Managed Care, Other (non HMO) | Attending: Emergency Medicine | Admitting: Emergency Medicine

## 2017-02-19 DIAGNOSIS — Z79899 Other long term (current) drug therapy: Secondary | ICD-10-CM | POA: Insufficient documentation

## 2017-02-19 DIAGNOSIS — R079 Chest pain, unspecified: Secondary | ICD-10-CM | POA: Diagnosis not present

## 2017-02-19 DIAGNOSIS — E119 Type 2 diabetes mellitus without complications: Secondary | ICD-10-CM | POA: Diagnosis not present

## 2017-02-19 DIAGNOSIS — R0609 Other forms of dyspnea: Secondary | ICD-10-CM | POA: Diagnosis not present

## 2017-02-19 DIAGNOSIS — R0789 Other chest pain: Secondary | ICD-10-CM | POA: Diagnosis not present

## 2017-02-19 DIAGNOSIS — Z7984 Long term (current) use of oral hypoglycemic drugs: Secondary | ICD-10-CM | POA: Insufficient documentation

## 2017-02-19 DIAGNOSIS — I10 Essential (primary) hypertension: Secondary | ICD-10-CM | POA: Insufficient documentation

## 2017-02-19 LAB — I-STAT TROPONIN, ED
TROPONIN I, POC: 0.01 ng/mL (ref 0.00–0.08)
Troponin i, poc: 0 ng/mL (ref 0.00–0.08)

## 2017-02-19 LAB — CBC
HCT: 37.3 % (ref 36.0–46.0)
Hemoglobin: 11.6 g/dL — ABNORMAL LOW (ref 12.0–15.0)
MCH: 22.2 pg — AB (ref 26.0–34.0)
MCHC: 31.1 g/dL (ref 30.0–36.0)
MCV: 71.3 fL — AB (ref 78.0–100.0)
PLATELETS: 349 10*3/uL (ref 150–400)
RBC: 5.23 MIL/uL — ABNORMAL HIGH (ref 3.87–5.11)
RDW: 14.8 % (ref 11.5–15.5)
WBC: 5.3 10*3/uL (ref 4.0–10.5)

## 2017-02-19 LAB — BASIC METABOLIC PANEL
Anion gap: 9 (ref 5–15)
BUN: 8 mg/dL (ref 6–20)
CALCIUM: 9.1 mg/dL (ref 8.9–10.3)
CHLORIDE: 99 mmol/L — AB (ref 101–111)
CO2: 28 mmol/L (ref 22–32)
CREATININE: 0.64 mg/dL (ref 0.44–1.00)
GFR calc Af Amer: >60 mL/min (ref >60)
GFR calc non Af Amer: >60 mL/min (ref >60)
GLUCOSE: 278 mg/dL — AB (ref 65–99)
Potassium: 3.9 mmol/L (ref 3.5–5.1)
Sodium: 136 mmol/L (ref 135–145)

## 2017-02-19 LAB — BRAIN NATRIURETIC PEPTIDE: B Natriuretic Peptide: 23.7 pg/mL (ref 0.0–100.0)

## 2017-02-19 MED ORDER — AMLODIPINE BESYLATE 10 MG PO TABS: 10.0000 mg | ORAL_TABLET | Freq: Every day | ORAL | 0 refills | Status: DC

## 2017-02-19 MED ORDER — SPIRONOLACTONE 25 MG PO TABS
25.0000 mg | ORAL_TABLET | Freq: Once | ORAL | Status: AC
  Administered 2017-02-19: 25 mg via ORAL
  Filled 2017-02-19: qty 1

## 2017-02-19 MED ORDER — CARVEDILOL 12.5 MG PO TABS
25.0000 mg | ORAL_TABLET | Freq: Once | ORAL | Status: AC
  Administered 2017-02-19: 25 mg via ORAL
  Filled 2017-02-19: qty 2

## 2017-02-19 MED ORDER — LINAGLIPTIN 5 MG PO TABS: 5.0000 mg | ORAL_TABLET | Freq: Every day | ORAL | 0 refills | Status: DC

## 2017-02-19 MED ORDER — LOSARTAN POTASSIUM 100 MG PO TABS: 100.0000 mg | ORAL_TABLET | Freq: Every day | ORAL | 0 refills | Status: DC

## 2017-02-19 MED ORDER — JARDIANCE 25 MG PO TABS: 25.0000 mg | ORAL_TABLET | Freq: Every day | ORAL | 0 refills | Status: DC

## 2017-02-19 MED ORDER — SPIRONOLACTONE 25 MG PO TABS: 25.0000 mg | ORAL_TABLET | Freq: Every day | ORAL | 0 refills | Status: DC

## 2017-02-19 MED ORDER — AMLODIPINE BESYLATE 5 MG PO TABS
10.0000 mg | ORAL_TABLET | Freq: Once | ORAL | Status: AC
  Administered 2017-02-19: 10 mg via ORAL
  Filled 2017-02-19: qty 2

## 2017-02-19 MED ORDER — METFORMIN HCL 1000 MG PO TABS: 1000.0000 mg | ORAL_TABLET | Freq: Two times a day (BID) | ORAL | 0 refills | Status: DC

## 2017-02-19 MED ORDER — CARVEDILOL 25 MG PO TABS: 25.0000 mg | ORAL_TABLET | Freq: Every day | ORAL | 0 refills | Status: DC

## 2017-02-19 MED ORDER — LOSARTAN POTASSIUM 50 MG PO TABS
100.0000 mg | ORAL_TABLET | Freq: Once | ORAL | Status: AC
  Administered 2017-02-19: 100 mg via ORAL
  Filled 2017-02-19: qty 2

## 2017-02-19 NOTE — ED Notes (Signed)
Pt ambulated to restroom, steady gait  

## 2017-02-19 NOTE — Discharge Planning (Signed)
Rustin Erhart J. Clydene Laming, RN, BSN, [place] [phone]  [facility] set up appointment with [place]  on 5/31 @ 2:00.  Spoke with pt at bedside and advised to please arrive 15 min early and take a picture ID and your current medications.  Pt verbalizes understanding of keeping appointment.

## 2017-02-19 NOTE — Discharge Instructions (Signed)
All of your lab work and imaging has been reassuring. He has an appointment scheduled for primary care doctor on 5/31. He also will be notified for cardiology appointment. I have refilled her medication at the request of cardiology for 1 month. Make sure to follow up with your appointments. Return to the ED if she developed worsening symptoms.

## 2017-02-19 NOTE — Consult Note (Signed)
Cardiology Consult    Patient ID: Claudia [name] MRN: [id number], DOB/AGE: 01-Jun-1966   Admit date: 02/19/2017 Date of Consult: 02/19/2017  Primary Physician: Patient, No Pcp Per Primary Cardiologist: Marlou Porch Requesting Provider: Tamera Punt Reason for Consultation: Chest pain  Claudia [name] is a 51 y.o. female who is being seen today for the evaluation of chest pain at the request of Dr. Tamera Punt   Patient Profile    51 yo female with PMH of HTN, Hypercholesterolemia, DM, OA and obesity who presented with intermittent chest pain.   Past Medical History   Past Medical History:  Diagnosis Date  . Diabetes mellitus   . Fibroids   . Headache(784.0)   . Hypercholesterolemia   . Hyperlipidemia   . Hypertension   . Osteoarthritis    , with mild meniscus tear, followed by orthopedics--Dr Gladstone Lighter 04/29/10  . Right knee pain   . Seizures ([place])    as child  . Vitamin D deficiency     Past Surgical History:  Procedure Laterality Date  . ANTERIOR CERVICAL DECOMP/DISCECTOMY FUSION N/A 09/07/2013   Procedure: ANTERIOR CERVICAL DECOMPRESSION/DISCECTOMY FUSION 1 LEVEL;  Surgeon: Sinclair Ship, MD;  Location: [facility];  Service: Orthopedics;  Laterality: N/A;  Anterior cervical decompression fusion, cervical 5-6 with instrumentation and allograft  . LAPAROSCOPIC ASSISTED VAGINAL HYSTERECTOMY  1/14  . SHOULDER SURGERY Right 11   rotator cuff  . TUBAL LIGATION    . UMBILICAL HERNIA REPAIR       Allergies  Allergies  Allergen Reactions  . Crestor [Rosuvastatin]     MUSCLE ACHES HEADACHES   . Hydrocodone Itching  . Other Hives, Swelling and Other (See Comments)    Vision changes; ALLERGIC TO ALL NUTS EXCEPT PEANUTS  . Oxycodone Itching  . Pravachol [Pravastatin Sodium] Itching    Joint pain  . Simvastatin Hives    History of Present Illness    Mrs. Claudia [name] is a 51 yo female with PMH of HTN, Hypercholesterolemia, DM, OA and obesity. She has been seen by Dr. Marlou Porch in the outpatient  setting for mildly reduced EF in the past that improved with medical therapy. Last echo 6/16 showed normal EF with no WMA and G1DD. Reports she has been in her usual state of health until about 2 weeks ago. She began to developed intermittent episodes of chest tightness with shortness of breath. Also ran out of her blood pressure medications around the same time. Reports being at work today and was moving around boxes when she developed chest tightness and dyspnea. Told her boss that she was having pain and instructed to go to the ED to be checked out. Appears to have a hx of difficult time controlling her blood pressure in the past from previous notes, but has also struggled to obtain/maintain her medications as well.   In the ED her labs showed stable electrolytes. POC troponin negx1. EKG showed SR with no acute ST/T wave changes. CXR negative. She was hypertensive in the ED with systolic 379. Given her home BP meds with improvement. Currently pain free.   Inpatient Medications      Family History    Family History  Problem Relation Age of Onset  . Hypertension Mother   . Diabetes Mother   . Hypercholesterolemia Mother   . Hypertension Sister   . CVA Maternal Grandmother   . Hypertension Maternal Grandmother   . Diabetes Maternal Grandmother   . Heart attack Neg Hx     Social History    Social  History   Social History  . Marital status: Married    Spouse name: N/A  . Number of children: N/A  . Years of education: N/A   Occupational History  . Not on file.   Social History Main Topics  . Smoking status: Never Smoker  . Smokeless tobacco: Never Used  . Alcohol use No  . Drug use: No  . Sexual activity: Not on file   Other Topics Concern  . Not on file   Social History Narrative  . No narrative on file     Review of Systems    All other systems reviewed and are otherwise negative except as noted above.  Physical Exam    Blood pressure (!) 157/87, pulse 74,  temperature 97.7 F (36.5 C), temperature source Oral, resp. rate 20, height 5' 7.5" (1.715 m), weight (!) 305 lb (138.3 kg), last menstrual period 05/09/2012, SpO2 95 %.  General: Pleasant obese AAF, NAD Psych: Normal affect. Neuro: Alert and oriented X 3. Moves all extremities spontaneously. HEENT: Normal  Neck: Supple without bruits or JVD. Lungs:  Resp regular and unlabored, CTA. Heart: RRR no s3, s4, or murmurs. Abdomen: Soft, non-tender, non-distended, BS + x 4.  Extremities: No clubbing, cyanosis or edema. DP/PT/Radials 2+ and equal bilaterally.  Labs    Troponin [facility] of Care Test)  Recent Labs  02/19/17 1126  TROPIPOC 0.00   No results for input(s): CKTOTAL, CKMB, TROPONINI in the last 72 hours. Lab Results  Component Value Date   WBC 5.3 02/19/2017   HGB 11.6 (L) 02/19/2017   HCT 37.3 02/19/2017   MCV 71.3 (L) 02/19/2017   PLT 349 02/19/2017    Recent Labs Lab 02/19/17 1104  NA 136  K 3.9  CL 99*  CO2 28  BUN 8  CREATININE 0.64  CALCIUM 9.1  GLUCOSE 278*   No results found for: CHOL, HDL, LDLCALC, TRIG Lab Results  Component Value Date   DDIMER (H) 11/06/2010    0.55        AT THE INHOUSE ESTABLISHED CUTOFF VALUE OF 0.48 ug/mL FEU, THIS ASSAY HAS BEEN DOCUMENTED IN THE LITERATURE TO HAVE A SENSITIVITY AND NEGATIVE PREDICTIVE VALUE OF AT LEAST 98 TO 99%.  THE TEST RESULT SHOULD BE CORRELATED WITH AN ASSESSMENT OF THE CLINICAL PROBABILITY OF DVT / VTE.     Radiology Studies    Dg Chest 2 View  Result Date: 02/19/2017 CLINICAL DATA:  Dyspnea and chest pain for 2 weeks EXAM: CHEST  2 VIEW COMPARISON:  09/05/2013 chest radiograph. FINDINGS: Surgical hardware from ACDF overlies the lower cervical spine. Stable cardiomediastinal silhouette with mild cardiomegaly. No pneumothorax. No pleural effusion. No overt pulmonary edema. Mild curvilinear opacities at the lung bases. No consolidative airspace disease. IMPRESSION: 1. Stable mild cardiomegaly  without pulmonary edema . 2. Mild bibasilar curvilinear lung opacities, favor mild scarring or atelectasis. Electronically Signed   By: Ilona Sorrel M.D.   On: 02/19/2017 11:33    ECG & Cardiac Imaging    EKG: SR   Echo: 6/16   Study Conclusions  - Left ventricle: The cavity size was normal. Systolic function was   normal. Wall motion was normal; there were no regional wall   motion abnormalities. There was an increased relative   contribution of atrial contraction to ventricular filling.   Doppler parameters are consistent with abnormal left ventricular   relaxation (grade 1 diastolic dysfunction). - Atrial septum: No defect or patent foramen ovale was identified.  Assessment &  Plan    51 yo female with PMH of HTN, Hypercholesterolemia, DM, OA and obesity who presented with intermittent chest pain.  1. Chest pain: Symptoms started about a week ago when she ran out of her blood pressure medications. Has been having intermittent episodes of chest tightness since that time. Had an episode while at work again today, with tightness in her eyes. Systolic BP was in the 626R in the ED. Trop neg x1. Ekg non acute. She was given her home medications with improvement in her bp. Second Trop pending -- suspect this is related to running out of her BP medications. Suggest CM assistance with finding a new PCP and stressed the importance of resuming home medications.  -- will plan for follow up in the office with APP within the next 2 weeks. Can determine if further work up is needed at that time.   2. HTN: Uncontrolled on arrival, better with home medications. Would continue on home regimen. Compliance stressed to the patient. Encouraged to check blood pressure at home.   3. HL: On statin  4. DM: on metformin, follow up with PCP appt  5. Obesity: discussed the need for diet and exercise   Signed, Reino Bellis, NP-C Pager [phone] 02/19/2017, 2:53 PM

## 2017-02-19 NOTE — ED Triage Notes (Signed)
Pt notes intermittent chest heaviness and shortness of breath over the past 2 weeks. Patient states she is unable to tolerate laying flat and shortness of breath and chest pain starts when she is exerting herself. Patient states she has been out of htn and hld medications foe one month but has still been taking metformin. Patient endorses some intermittent dizziness with position changes. Denies other neuro deficits, notes some lower leg swelling as well. Pt skin warm and dry, able to speak full sentences without difficulty.

## 2017-02-19 NOTE — ED Provider Notes (Signed)
[facility] DEPT Provider Note   CSN: [id number] Arrival date & time: 02/19/17  1039     History   Chief Complaint Chief Complaint  Patient presents with  . Chest Pain    HPI Claudia [name] is a 51 y.o. female.  HPI 51 year old African-American female past medical history significant for diabetes, hypertension, hyperlipidemia, cardiomyopathy last EF 50% and 2016 presents to the emergency Department today with complaints of chest pain and shortness of breath. Patient states in the past 2 weeks she has had increased "chest heaviness" and dyspnea on exertion. The patient states that the chest heaviness as left-sided does not radiate. Not associated with diaphoresis, nausea, emesis. She also reports increased bilateral lower extremity edema. Complains of orthopnea at home and states that her symptoms started with exertion. States that she has been out of her hypertension and hyperlipidemia medications for 5 days now. She still taking her diabetes medication. Patient also reports associated intermittent dizziness with position changes. States that she was at work today standing up when she developed this chest heaviness and shortness of breath and presented to the ED for evaluation. Patient states her symptoms have resolved since she is sitting on the stretcher. Does have history of cardiomyopathy that she is currently followed by cardiology and has an appointment next month. The patient denies any history of DVT/PE, lower extremity calf tenderness, recent hospitalizations/surgeries, prolonged immobilizations, OCP or hormone use, tobacco use. Patient denies any early family cardiac history or MI history. Patient states her symptoms have resolved denies any complaints at this time.  Past Medical History:  Diagnosis Date  . Diabetes mellitus   . Fibroids   . Headache(784.0)   . Hypercholesterolemia   . Hyperlipidemia   . Hypertension   . Osteoarthritis    , with mild meniscus tear, followed by  orthopedics--Dr Gladstone Lighter 04/29/10  . Right knee pain   . Seizures ([place])    as child  . Vitamin D deficiency     Patient Active Problem List   Diagnosis Date Noted  . Cardiomyopathy ([place]) 03/16/2014  . Radiculopathy 09/07/2013  . Hypokalemia 06/02/2012  . HTN (hypertension) 06/01/2012  . DM (diabetes mellitus) ([place]) 06/01/2012  . Hyperlipidemia 06/01/2012  . DOE (dyspnea on exertion) 06/01/2012  . Obesity 06/01/2012    Past Surgical History:  Procedure Laterality Date  . ANTERIOR CERVICAL DECOMP/DISCECTOMY FUSION N/A 09/07/2013   Procedure: ANTERIOR CERVICAL DECOMPRESSION/DISCECTOMY FUSION 1 LEVEL;  Surgeon: Sinclair Ship, MD;  Location: [facility];  Service: Orthopedics;  Laterality: N/A;  Anterior cervical decompression fusion, cervical 5-6 with instrumentation and allograft  . LAPAROSCOPIC ASSISTED VAGINAL HYSTERECTOMY  1/14  . SHOULDER SURGERY Right 11   rotator cuff  . TUBAL LIGATION    . UMBILICAL HERNIA REPAIR      OB History    Gravida Para Term Preterm AB Living   4 4 3 1   3    SAB TAB Ectopic Multiple Live Births                   Home Medications    Prior to Admission medications   Medication Sig Start Date End Date Taking? Authorizing Provider  acetaminophen (TYLENOL) 650 MG CR tablet Take 650 mg by mouth every 8 (eight) hours as needed for pain.    [provider]  amLODipine (NORVASC) 10 MG tablet Take 1 tablet (10 mg total) by mouth daily. 04/28/16   Jerline Pain, MD  carvedilol (COREG) 25 MG tablet Take 1.5 tablets by  mouth twice daily 04/28/16   Jerline Pain, MD  ferrous sulfate 325 (65 FE) MG tablet Take 325 mg by mouth 2 (two) times daily.    [provider]  ibuprofen (ADVIL,MOTRIN) 800 MG tablet Take 1 tablet (800 mg total) by mouth 3 (three) times daily. Patient not taking: Reported on 01/16/2017 11/25/15   Delsa Grana, PA-C  JARDIANCE 25 MG TABS tablet Take 25 mg by mouth daily. 02/16/15   [provider]  linagliptin  (TRADJENTA) 5 MG TABS tablet Take 5 mg by mouth daily.    [provider]  losartan (COZAAR) 100 MG tablet Take 1 tablet (100 mg total) by mouth daily. 04/28/16   Jerline Pain, MD  meloxicam (MOBIC) 7.5 MG tablet Take 2 tablets (15 mg total) by mouth daily. 01/16/17   Larene Pickett, PA-C  metFORMIN (GLUCOPHAGE) 1000 MG tablet Take 1,000 mg by mouth 2 (two) times daily with a meal.    [provider]  Omega-3 Fatty Acids (FISH OIL PO) Take 360 mg by mouth daily.    [provider]  predniSONE (DELTASONE) 20 MG tablet Take 2 tablets (40 mg total) by mouth daily. Take 40 mg by mouth daily for 3 days, then 20mg  by mouth daily for 3 days, then 10mg  daily for 3 days Patient not taking: Reported on 01/16/2017 11/25/15   Delsa Grana, PA-C  rosuvastatin (CRESTOR) 10 MG tablet Take 1 tablet (10 mg total) by mouth once a week. Patient not taking: Reported on 01/16/2017 04/28/16   Jerline Pain, MD  spironolactone (ALDACTONE) 25 MG tablet Take 1 tablet (25 mg total) by mouth daily. 04/28/16   Jerline Pain, MD  traMADol (ULTRAM) 50 MG tablet Take 1 tablet (50 mg total) by mouth every 6 (six) hours as needed. Patient not taking: Reported on 01/16/2017 11/25/15   Delsa Grana, PA-C  Turmeric 500 MG CAPS Take 500 mg by mouth 2 (two) times daily.    [provider]  Vitamin D, Ergocalciferol, (DRISDOL) 50000 UNITS CAPS capsule Take 50,000 Units by mouth every 7 (seven) days. Mondays    [provider]    Family History Family History  Problem Relation Age of Onset  . Hypertension Mother   . Diabetes Mother   . Hypercholesterolemia Mother   . Hypertension Sister   . CVA Maternal Grandmother   . Hypertension Maternal Grandmother   . Diabetes Maternal Grandmother   . Heart attack Neg Hx     Social History Social History  Substance Use Topics  . Smoking status: Never Smoker  . Smokeless tobacco: Never Used  . Alcohol use No     Allergies   Crestor  [rosuvastatin]; Hydrocodone; Other; Oxycodone; Pravachol [pravastatin sodium]; and Simvastatin   Review of Systems Review of Systems  Constitutional: Negative for chills, diaphoresis and fever.  HENT: Negative for congestion.   Eyes: Negative for visual disturbance.  Respiratory: Positive for shortness of breath (on exertion). Negative for cough.   Cardiovascular: Positive for chest pain and leg swelling (bilatearl). Negative for palpitations.  Gastrointestinal: Negative for abdominal pain, diarrhea, nausea and vomiting.  Genitourinary: Negative for dysuria, flank pain, frequency, hematuria and urgency.  Musculoskeletal: Negative for back pain.  Skin: Negative.   Neurological: Positive for dizziness. Negative for syncope, weakness, light-headedness, numbness and headaches.     Physical Exam Updated Vital Signs BP (!) 183/107 (BP Location: Left Arm)   Pulse 88   Temp 97.7 F (36.5 C) (Oral)   Resp  20   Ht 5' 7.5" (1.715 m)   Wt (!) 138.3 kg (305 lb)   LMP 05/09/2012   SpO2 99%   BMI 47.06 kg/m   Physical Exam  Constitutional: She is oriented to person, place, and time. She appears well-developed and well-nourished. No distress.  Nontoxic-appearing. Obese female.  HENT:  Head: Normocephalic and atraumatic.  Mouth/Throat: Oropharynx is clear and moist.  Eyes: Conjunctivae and EOM are normal. Pupils are equal, round, and reactive to light. Right eye exhibits no discharge. Left eye exhibits no discharge. No scleral icterus.  Neck: Normal range of motion. Neck supple. No JVD present. No tracheal deviation present. No thyromegaly present.  Cardiovascular: Normal rate, regular rhythm, normal heart sounds and intact distal pulses.  Exam reveals no gallop and no friction rub.   No murmur heard. Pulses are 2+ in all extremity is bilaterally. Extremities are warm to touch.  Pulmonary/Chest: Effort normal and breath sounds normal. No respiratory distress. She has no wheezes. She has no  rales. She exhibits no tenderness.  Abdominal: Soft. Bowel sounds are normal. She exhibits no distension. There is no tenderness. There is no rebound and no guarding.  Musculoskeletal: Normal range of motion.  Trace pitting edema in the lower extremity as bilaterally up to level of the patella. No calf tenderness. DP pulses are 2+ bilaterally.  Lymphadenopathy:    She has no cervical adenopathy.  Neurological: She is alert and oriented to person, place, and time.  The patient is alert, attentive, and oriented x 3. Speech is clear. Cranial nerve II-VII grossly intact. Negative pronator drift. Sensation intact. Strength 5/5 in all extremities. Reflexes 2+ and symmetric at biceps, triceps, knees, and ankles. Rapid alternating movement and fine finger movements intact. Romberg is absent. Posture and gait normal.   Skin: Skin is warm and dry. Capillary refill takes less than 2 seconds.  Nursing note and vitals reviewed.    ED Treatments / Results  Labs (all labs ordered are listed, but only abnormal results are displayed) Labs Reviewed  BASIC METABOLIC PANEL - Abnormal; Notable for the following:       Result Value   Chloride 99 (*)    Glucose, Bld 278 (*)    All other components within normal limits  CBC - Abnormal; Notable for the following:    RBC 5.23 (*)    Hemoglobin 11.6 (*)    MCV 71.3 (*)    MCH 22.2 (*)    All other components within normal limits  BRAIN NATRIURETIC PEPTIDE  I-STAT TROPOININ, ED  I-STAT TROPOININ, ED    EKG  EKG Interpretation None       Radiology Dg Chest 2 View  Result Date: 02/19/2017 CLINICAL DATA:  Dyspnea and chest pain for 2 weeks EXAM: CHEST  2 VIEW COMPARISON:  09/05/2013 chest radiograph. FINDINGS: Surgical hardware from ACDF overlies the lower cervical spine. Stable cardiomediastinal silhouette with mild cardiomegaly. No pneumothorax. No pleural effusion. No overt pulmonary edema. Mild curvilinear opacities at the lung bases. No  consolidative airspace disease. IMPRESSION: 1. Stable mild cardiomegaly without pulmonary edema . 2. Mild bibasilar curvilinear lung opacities, favor mild scarring or atelectasis. Electronically Signed   By: Ilona Sorrel M.D.   On: 02/19/2017 11:33    Procedures Procedures (including critical care time)  Medications Ordered in ED Medications  carvedilol (COREG) tablet 25 mg (not administered)  losartan (COZAAR) tablet 100 mg (not administered)  spironolactone (ALDACTONE) tablet 25 mg (not administered)  amLODipine (NORVASC) tablet 10 mg (10  mg Oral Given 02/19/17 1220)     Initial Impression / Assessment and Plan / ED Course  I have reviewed the triage vital signs and the nursing notes.  Pertinent labs & imaging results that were available during my care of the patient were reviewed by me and considered in my medical decision making (see chart for details).     Patient presents to the emergency Department today with complaints of increased chest heaviness and shortness of breath with exertion over the past 2 weeks. States that she is unable to lie flat due to shortness of breath. She has been off of her blood pressure medicine spironolactone for approximately 5 days. Patient recently has not been able to see her primary care doctor and they let her go from the practice. She endorses small amount bilateral lower edema. Patient does have history of cardiomyopathy and is followed by cardiology. Has not seen them in the past year. Patient was initially hypertensive in the ED however this is probably due to her being off her blood pressure medicine for the past week. The patient's blood work has been relatively benign. No leukocytosis. Hemoglobin is stable. Electrolytes are normal. BNP is normal. Chest x-ray shows stable cardiomegaly but no pulmonary edema. Ekg shows no ischemic changes. Delta troponins have been negative. Heart pathway score is 3. Patient given her home blood pressure medicine.  Blood pressure has improved. Patient has been asymptomatic while in the ED. Patient is very low risk for DVT/PE. She is not tachycardic in the ED. Has been oxygenating well however O2 sats 2 readings below 95% filled this is due to patient sleeping in the room along with  morbid obesity. Patient has no other risk factors for PE/DVT. Pulses are equal bilaterally. Extremities are warm to touch. No ongoing symptoms. Doubt aortic dissection. The patient's symptoms are related to being off her blood pressure medicine over the past week.   Spoke with Reino Bellis NP with cardiology who saw patient, with her attending in the ED. They feel that patient's symptoms are due to lack of blood pressure control. They have schedule patient for an outpatient appointment in the next week. I also got case manager involved to find patient primary care doctor and schedule her an appointment. Cardiology asked that I Refill her medication for 1 month supply. They have low suspicion for ACS/PE/dissection at this time. The patient can be safely discharged home.   Pt is hemodynamically stable, in NAD, & able to ambulate in the ED. Pain has been managed & has no complaints prior to dc. Pt is comfortable with above plan and is stable for discharge at this time. All questions were answered prior to disposition. Strict return precautions for f/u to the ED were discussed.   Final Clinical Impressions(s) / ED Diagnoses   Final diagnoses:  Chest pain, unspecified type  Dyspnea on exertion    New Prescriptions Discharge Medication List as of 02/19/2017  3:59 PM       Doristine Devoid, PA-C 02/19/17 1639    Malvin Johns, MD 02/19/17 1712

## 2017-02-19 NOTE — ED Notes (Signed)
Pt denies pain, stating "no pain, not like it was before I came in".

## 2017-02-25 ENCOUNTER — Encounter: Payer: Self-pay | Admitting: Family Medicine

## 2017-02-25 ENCOUNTER — Ambulatory Visit (INDEPENDENT_AMBULATORY_CARE_PROVIDER_SITE_OTHER): Payer: Managed Care, Other (non HMO) | Admitting: Family Medicine

## 2017-02-25 VITALS — BP 114/70 | HR 87 | Temp 98.7°F | Resp 14 | Ht 67.5 in | Wt 305.0 lb

## 2017-02-25 DIAGNOSIS — E559 Vitamin D deficiency, unspecified: Secondary | ICD-10-CM | POA: Diagnosis not present

## 2017-02-25 DIAGNOSIS — E669 Obesity, unspecified: Secondary | ICD-10-CM | POA: Diagnosis not present

## 2017-02-25 DIAGNOSIS — R0602 Shortness of breath: Secondary | ICD-10-CM | POA: Diagnosis not present

## 2017-02-25 DIAGNOSIS — E7849 Other hyperlipidemia: Secondary | ICD-10-CM

## 2017-02-25 DIAGNOSIS — IMO0001 Reserved for inherently not codable concepts without codable children: Secondary | ICD-10-CM

## 2017-02-25 DIAGNOSIS — I5030 Unspecified diastolic (congestive) heart failure: Secondary | ICD-10-CM

## 2017-02-25 DIAGNOSIS — Z794 Long term (current) use of insulin: Secondary | ICD-10-CM | POA: Diagnosis not present

## 2017-02-25 DIAGNOSIS — E784 Other hyperlipidemia: Secondary | ICD-10-CM

## 2017-02-25 DIAGNOSIS — I10 Essential (primary) hypertension: Secondary | ICD-10-CM

## 2017-02-25 DIAGNOSIS — E119 Type 2 diabetes mellitus without complications: Secondary | ICD-10-CM | POA: Diagnosis not present

## 2017-02-25 DIAGNOSIS — Z6841 Body Mass Index (BMI) 40.0 and over, adult: Secondary | ICD-10-CM

## 2017-02-25 MED ORDER — MELOXICAM 7.5 MG PO TABS: 15.0000 mg | ORAL_TABLET | Freq: Every day | ORAL | 1 refills | Status: DC

## 2017-02-25 MED ORDER — FISH OIL 1000 MG PO CPDR: 3000.0000 mg | DELAYED_RELEASE_CAPSULE | Freq: Every day | ORAL | 2 refills | Status: DC

## 2017-02-25 MED ORDER — ALBUTEROL SULFATE HFA 108 (90 BASE) MCG/ACT IN AERS: 2.0000 | INHALATION_SPRAY | RESPIRATORY_TRACT | 2 refills | Status: DC | PRN

## 2017-02-25 NOTE — Progress Notes (Signed)
Patient ID: Claudia [name], female    DOB: 20-Feb-1966, 51 y.o.   MRN: [id number]  PCP: Scot Jun, FNP  Chief Complaint  Patient presents with  . Establish Care  . Hospitalization Follow-up    chest pain, sob  . Medication Refill    meloxicam,vitamin d,    Subjective:  HPI Claudia [name] is a 51 y.o. female presents to establish care and hospital follow-up. Previously followed at [facility] and dismissed due to an inability to pay and  outstanding bill. She was followed by [name] and [facility] endocrinology services over 1 year ago.    Medical problems include: Uncontrolled Diabetes, Hypertension, Hyperlipidemia, Cardiomyopathy   Obesity (BMI 47), Knee Pain, Vitamin D Deficiency, and Arthritis.  [name] presented to [facility] ED 02/19/2017, with a complaint of chest pain and dyspnea. She has a diagnosis systolic LV dysfunction with preserved EF 55%. Last followed by Dr. Candee Furbish  04/28/2016. During admission, [name] had been off of her antihypertensive and diabetes medications for more than  1 week as she could no longer obtain refills from previous providers. She was evaluated for underlying  cardiac dysfunction, found to be stable, discharge with 30 days supplies of chronic medications. Blood pressure upon arrival at the ED 183/107.  She presents new to [name] today. [name] at present, doesn't have a follow-up scheduled with  cardiology. Continues to have shortness of breath with strenuous activity, however denies any further chest pain.  current medications reviewed per medication list. Patient reports compliance since discharge from the emergency department.  Social History   Social History  . Marital status: Married    Spouse name: N/A  . Number of children: N/A  . Years of education: N/A   Occupational History  . Not on file.   Social History Main Topics  . Smoking status: Never Smoker  . Smokeless tobacco: Never Used  .  Alcohol use No  . Drug use: No  . Sexual activity: Not on file   Other Topics Concern  . Not on file   Social History Narrative  . No narrative on file    Family History  Problem Relation Age of Onset  . Hypertension Mother   . Diabetes Mother   . Hypercholesterolemia Mother   . Hypertension Sister   . CVA Maternal Grandmother   . Hypertension Maternal Grandmother   . Diabetes Maternal Grandmother   . Heart attack Neg Hx    Review of Systems See HPI Patient Active Problem List   Diagnosis Date Noted  . Chest pain   . Benign essential HTN   . Cardiomyopathy ([place]) 03/16/2014  . Radiculopathy 09/07/2013  . Hypokalemia 06/02/2012  . HTN (hypertension) 06/01/2012  . DM (diabetes mellitus) ([place]) 06/01/2012  . Hyperlipidemia 06/01/2012  . Dyspnea on exertion 06/01/2012  . Obesity 06/01/2012    Allergies  Allergen Reactions  . Crestor [Rosuvastatin]     MUSCLE ACHES HEADACHES   . Hydrocodone Itching  . Other Hives, Swelling and Other (See Comments)    Vision changes; ALLERGIC TO ALL NUTS EXCEPT PEANUTS  . Oxycodone Itching  . Pravachol [Pravastatin Sodium] Itching    Joint pain  . Simvastatin Hives    Prior to Admission medications   Medication Sig Start Date End Date Taking? Authorizing Provider  amLODipine (NORVASC) 10 MG tablet Take 1 tablet (10 mg total) by mouth daily. 02/19/17  Yes Ocie Cornfield T, PA-C  carvedilol (COREG) 25 MG tablet Take 1 tablet (  25 mg total) by mouth daily. 02/19/17  Yes Doristine Devoid, PA-C  ferrous sulfate 325 (65 FE) MG tablet Take 325 mg by mouth 2 (two) times daily.   Yes [provider]  linagliptin (TRADJENTA) 5 MG TABS tablet Take 1 tablet (5 mg total) by mouth daily. 02/19/17  Yes Leaphart, Zack Seal, PA-C  losartan (COZAAR) 100 MG tablet Take 1 tablet (100 mg total) by mouth daily. 02/19/17  Yes Ocie Cornfield T, PA-C  metFORMIN (GLUCOPHAGE) 1000 MG tablet Take 1 tablet (1,000 mg total) by mouth 2 (two) times  daily with a meal. 02/19/17  Yes Leaphart, Zack Seal, PA-C  spironolactone (ALDACTONE) 25 MG tablet Take 1 tablet (25 mg total) by mouth daily. 02/19/17  Yes Leaphart, Zack Seal, PA-C  Turmeric 500 MG CAPS Take 500 mg by mouth 2 (two) times daily.   Yes [provider]  Vitamin D, Ergocalciferol, (DRISDOL) 50000 UNITS CAPS capsule Take 50,000 Units by mouth every 7 (seven) days. Mondays   Yes [provider]  acetaminophen (TYLENOL) 650 MG CR tablet Take 650 mg by mouth every 8 (eight) hours as needed for pain.    [provider]  ibuprofen (ADVIL,MOTRIN) 800 MG tablet Take 1 tablet (800 mg total) by mouth 3 (three) times daily. Patient not taking: Reported on 01/16/2017 11/25/15   Delsa Grana, PA-C  JARDIANCE 25 MG TABS tablet Take 25 mg by mouth daily. Patient not taking: Reported on 02/25/2017 02/19/17   Ocie Cornfield T, PA-C  meloxicam (MOBIC) 7.5 MG tablet Take 2 tablets (15 mg total) by mouth daily. 01/16/17   Larene Pickett, PA-C  Omega-3 Fatty Acids (FISH OIL PO) Take 360 mg by mouth daily.    [provider]  rosuvastatin (CRESTOR) 10 MG tablet Take 1 tablet (10 mg total) by mouth once a week. Patient not taking: Reported on 01/16/2017 04/28/16   Jerline Pain, MD  traMADol (ULTRAM) 50 MG tablet Take 1 tablet (50 mg total) by mouth every 6 (six) hours as needed. Patient not taking: Reported on 01/16/2017 11/25/15   Delsa Grana, PA-C    Past Medical, Surgical Family and Social History reviewed and updated.    Objective:   Today's Vitals   02/25/17 1423  BP: 114/70  Pulse: 87  Resp: 14  Temp: 98.7 F (37.1 C)  TempSrc: Oral  Weight: (!) 305 lb (138.3 kg)  Height: 5' 7.5" (1.715 m)    Wt Readings from Last 3 Encounters:  02/25/17 (!) 305 lb (138.3 kg)  02/19/17 (!) 305 lb (138.3 kg)  04/28/16 296 lb (134.3 kg)    Physical Exam  Constitutional: She is oriented to person, place, and time. She appears well-developed and well-nourished.   HENT:  Head: Normocephalic and atraumatic.  Eyes: Conjunctivae are normal. Pupils are equal, round, and reactive to light.  Neck: Neck supple.  Cardiovascular: Normal rate, regular rhythm, normal heart sounds and intact distal pulses.   Pulmonary/Chest: Effort normal. She has decreased breath sounds in the right upper field, the right middle field, the right lower field, the left upper field, the left middle field and the left lower field.  Abdominal:  Significant midabdominal girth   Musculoskeletal: She exhibits edema.  Bilateral lower extremity non-pitting edema   Neurological: She is alert and oriented to person, place, and time.  Psychiatric: She has a normal mood and affect. Her behavior is normal. Judgment and thought content normal.     Assessment & Plan:  1. Vitamin D  deficiency - VITAMIN D 25 Hydroxy (Vit-D Deficiency, Fractures)  2. Shortness of breath - CBC with Differential  3. Other hyperlipidemia, unable to tolerate statin therapy. - Thyroid Panel With TSH - Lipid panel -Start Fish Oil 3,000 mg daily   4. Type 2 diabetes mellitus without complication, with long-term current use of insulin (HCC) - COMPLETE METABOLIC PANEL WITH GFR -Return for follow-up 03/01/2017 due to lack of time to adequately address.  5. Diastolic congestive heart failure, unspecified HF chronicity (HCC) - Brain natriuretic peptide - Ambulatory referral to Cardiology --Limit fluid intake to no more than 1500 ml per day. -If you experiencing greater than 3 lb weight gain within 24 hours, notify our clinic or if after hours report to the Emergency Department. -It is important to keep your blood pressure under control. Monitor blood pressure at home and if you obtain readings greater than 150/90 consecutively over a period of 3 days, notify me here at the office. -Avoid adding table salt or eating food such as can soup or frozen meals which are high in sodium. This increases fluid retention and is  likely to worsen your heart failure.  .   6. Essential hypertension - Ambulatory referral to Cardiology   7. Class 3 obesity with serious comorbidity and body mass index (BMI) of 45.0 to 49.9 in adult, unspecified obesity type (HCC) -Goal, decrease weight below 300 lbs within next 3 months.   Carroll Sage. Kenton Kingfisher, MSN, FNP-C [facility]  [address] [place], [place] [address] [phone]

## 2017-02-25 NOTE — Patient Instructions (Addendum)
For treatment of shortness of breath:  Start Albuterol 2 puffs every 4-6 hours as needed for shortness of breath. Shortness of breat may be related to underlying environmental induced asthma.  I am reestablishing you with Dr. Marlou Porch for Cardiology care.  Your blood pressure is well controlled today. Continue medications as prescribed.  I will see you in office on Monday for diabetes management.

## 2017-02-26 LAB — COMPLETE METABOLIC PANEL WITH GFR
ALBUMIN: 4.1 g/dL (ref 3.6–5.1)
ALK PHOS: 52 U/L (ref 33–115)
ALT: 12 U/L (ref 6–29)
AST: 11 U/L (ref 10–35)
BILIRUBIN TOTAL: 0.4 mg/dL (ref 0.2–1.2)
BUN: 8 mg/dL (ref 7–25)
CALCIUM: 9.2 mg/dL (ref 8.6–10.2)
CO2: 25 mmol/L (ref 20–31)
Chloride: 102 mmol/L (ref 98–110)
Creat: 0.68 mg/dL (ref 0.50–1.10)
GLUCOSE: 101 mg/dL — AB (ref 65–99)
Potassium: 4.3 mmol/L (ref 3.5–5.3)
SODIUM: 139 mmol/L (ref 135–146)
TOTAL PROTEIN: 7.4 g/dL (ref 6.1–8.1)

## 2017-02-26 LAB — CBC WITH DIFFERENTIAL/PLATELET
BASOS PCT: 0 %
Basophils Absolute: 0 cells/uL (ref 0–200)
EOS PCT: 3 %
Eosinophils Absolute: 213 cells/uL (ref 15–500)
HCT: 39.3 % (ref 35.0–45.0)
Hemoglobin: 11.8 g/dL (ref 11.7–15.5)
LYMPHS PCT: 39 %
Lymphs Abs: 2769 cells/uL (ref 850–3900)
MCH: 22 pg — ABNORMAL LOW (ref 27.0–33.0)
MCHC: 30 g/dL — AB (ref 32.0–36.0)
MCV: 73.2 fL — ABNORMAL LOW (ref 80.0–100.0)
MPV: 8.5 fL (ref 7.5–12.5)
Monocytes Absolute: 639 cells/uL (ref 200–950)
Monocytes Relative: 9 %
NEUTROS PCT: 49 %
Neutro Abs: 3479 cells/uL (ref 1500–7800)
PLATELETS: 351 10*3/uL (ref 140–400)
RBC: 5.37 MIL/uL — ABNORMAL HIGH (ref 3.80–5.10)
RDW: 16 % — AB (ref 11.0–15.0)
WBC: 7.1 10*3/uL (ref 3.8–10.8)

## 2017-02-26 LAB — THYROID PANEL WITH TSH
FREE THYROXINE INDEX: 2.9 (ref 1.4–3.8)
T3 Uptake: 31 % (ref 22–35)
T4 TOTAL: 9.3 ug/dL (ref 4.5–12.0)
TSH: 0.52 mIU/L

## 2017-02-26 LAB — VITAMIN D 25 HYDROXY (VIT D DEFICIENCY, FRACTURES): Vit D, 25-Hydroxy: 26 ng/mL — ABNORMAL LOW (ref 30–100)

## 2017-02-26 LAB — LIPID PANEL
CHOLESTEROL: 173 mg/dL (ref <200)
HDL: 46 mg/dL — AB (ref >50)
LDL Cholesterol: 102 mg/dL — ABNORMAL HIGH (ref <100)
Total CHOL/HDL Ratio: 3.8 Ratio (ref <5.0)
Triglycerides: 123 mg/dL (ref <150)
VLDL: 25 mg/dL (ref <30)

## 2017-02-26 LAB — BRAIN NATRIURETIC PEPTIDE: BRAIN NATRIURETIC PEPTIDE: 6.2 pg/mL (ref <100)

## 2017-03-01 ENCOUNTER — Emergency Department (HOSPITAL_COMMUNITY): Payer: Managed Care, Other (non HMO)

## 2017-03-01 ENCOUNTER — Encounter (HOSPITAL_COMMUNITY): Payer: Self-pay | Admitting: Emergency Medicine

## 2017-03-01 ENCOUNTER — Emergency Department (HOSPITAL_COMMUNITY)
Admission: EM | Admit: 2017-03-01 | Discharge: 2017-03-01 | Disposition: A | Discharge status: Home / Self Care | Payer: Managed Care, Other (non HMO) | Attending: Emergency Medicine | Admitting: Emergency Medicine

## 2017-03-01 ENCOUNTER — Ambulatory Visit (INDEPENDENT_AMBULATORY_CARE_PROVIDER_SITE_OTHER): Payer: Managed Care, Other (non HMO) | Admitting: Family Medicine

## 2017-03-01 ENCOUNTER — Encounter: Payer: Self-pay | Admitting: Family Medicine

## 2017-03-01 VITALS — BP 142/78 | HR 95 | Temp 97.6°F | Resp 14 | Ht 67.5 in | Wt 304.4 lb

## 2017-03-01 DIAGNOSIS — J069 Acute upper respiratory infection, unspecified: Secondary | ICD-10-CM | POA: Insufficient documentation

## 2017-03-01 DIAGNOSIS — Z794 Long term (current) use of insulin: Secondary | ICD-10-CM

## 2017-03-01 DIAGNOSIS — I10 Essential (primary) hypertension: Secondary | ICD-10-CM | POA: Insufficient documentation

## 2017-03-01 DIAGNOSIS — Z79899 Other long term (current) drug therapy: Secondary | ICD-10-CM | POA: Diagnosis not present

## 2017-03-01 DIAGNOSIS — R809 Proteinuria, unspecified: Secondary | ICD-10-CM | POA: Diagnosis not present

## 2017-03-01 DIAGNOSIS — B9789 Other viral agents as the cause of diseases classified elsewhere: Secondary | ICD-10-CM

## 2017-03-01 DIAGNOSIS — R05 Cough: Secondary | ICD-10-CM | POA: Diagnosis present

## 2017-03-01 DIAGNOSIS — E119 Type 2 diabetes mellitus without complications: Secondary | ICD-10-CM | POA: Diagnosis not present

## 2017-03-01 DIAGNOSIS — Z7984 Long term (current) use of oral hypoglycemic drugs: Secondary | ICD-10-CM | POA: Diagnosis not present

## 2017-03-01 DIAGNOSIS — Z23 Encounter for immunization: Secondary | ICD-10-CM

## 2017-03-01 LAB — BASIC METABOLIC PANEL
ANION GAP: 8 (ref 5–15)
BUN: 10 mg/dL (ref 6–20)
CO2: 30 mmol/L (ref 22–32)
Calcium: 8.8 mg/dL — ABNORMAL LOW (ref 8.9–10.3)
Chloride: 99 mmol/L — ABNORMAL LOW (ref 101–111)
Creatinine, Ser: 0.69 mg/dL (ref 0.44–1.00)
GFR calc Af Amer: >60 mL/min (ref >60)
GLUCOSE: 277 mg/dL — AB (ref 65–99)
Potassium: 4.2 mmol/L (ref 3.5–5.1)
SODIUM: 137 mmol/L (ref 135–145)

## 2017-03-01 LAB — CBC
HEMATOCRIT: 38 % (ref 36.0–46.0)
HEMOGLOBIN: 12 g/dL (ref 12.0–15.0)
MCH: 22.3 pg — ABNORMAL LOW (ref 26.0–34.0)
MCHC: 31.6 g/dL (ref 30.0–36.0)
MCV: 70.6 fL — AB (ref 78.0–100.0)
Platelets: 327 10*3/uL (ref 150–400)
RBC: 5.38 MIL/uL — ABNORMAL HIGH (ref 3.87–5.11)
RDW: 14.5 % (ref 11.5–15.5)
WBC: 6.9 10*3/uL (ref 4.0–10.5)

## 2017-03-01 LAB — POCT URINALYSIS DIP (DEVICE)
Glucose, UA: 250 mg/dL — AB
Hgb urine dipstick: NEGATIVE
Ketones, ur: 15 mg/dL — AB
LEUKOCYTES UA: NEGATIVE
NITRITE: NEGATIVE
Protein, ur: 100 mg/dL — AB
Specific Gravity, Urine: >=1.03 (ref 1.005–1.030)
Urobilinogen, UA: 2 mg/dL — ABNORMAL HIGH (ref 0.0–1.0)
pH: 5.5 (ref 5.0–8.0)

## 2017-03-01 LAB — POCT GLYCOSYLATED HEMOGLOBIN (HGB A1C): Hemoglobin A1C: 9.1

## 2017-03-01 LAB — I-STAT TROPONIN, ED: Troponin i, poc: 0 ng/mL (ref 0.00–0.08)

## 2017-03-01 MED ORDER — VITAMIN D (ERGOCALCIFEROL) 1.25 MG (50000 UNIT) PO CAPS
50000.0000 [IU] | ORAL_CAPSULE | ORAL | 0 refills | Status: DC
Start: 2017-03-01 — End: 2017-11-22

## 2017-03-01 MED ORDER — GLIPIZIDE 5 MG PO TABS: 2.5000 mg | ORAL_TABLET | Freq: Two times a day (BID) | ORAL | 3 refills | Status: DC

## 2017-03-01 MED ORDER — BENZONATATE 100 MG PO CAPS: 100.0000 mg | ORAL_CAPSULE | Freq: Three times a day (TID) | ORAL | 0 refills | Status: DC | PRN

## 2017-03-01 MED ORDER — BLOOD GLUCOSE MONITOR KIT: PACK | 0 refills | Status: DC

## 2017-03-01 MED ORDER — ACETAMINOPHEN 500 MG PO TABS
1000.0000 mg | ORAL_TABLET | Freq: Once | ORAL | Status: AC
  Administered 2017-03-01: 1000 mg via ORAL
  Filled 2017-03-01: qty 2

## 2017-03-01 MED ORDER — GUAIFENESIN 100 MG/5ML PO SOLN
15.0000 mL | Freq: Once | ORAL | Status: AC
  Administered 2017-03-01: 300 mg via ORAL
  Filled 2017-03-01: qty 15

## 2017-03-01 NOTE — Progress Notes (Signed)
Patient ID: Claudia [name], female    DOB: 10-Apr-1966, 51 y.o.   MRN: [id number]  PCP: Scot Jun, FNP  Chief Complaint  Patient presents with  . Diabetes  . Cough     Subjective:  HPI Claudia [name] is a 51 y.o. female presents for diabetes evaluation. Previously followed at [facility] and dismissed due to an inability to pay and  outstanding bill. She was followed by [name] and [facility] endocrinology services over 1 year ago.    Medical problems include: Uncontrolled Diabetes, Hypertension, Hyperlipidemia, Cardiomyopathy   Obesity (Body mass index is 46.97 kg/m.), Knee Pain, Vitamin D Deficiency, and Arthritis.  Journie recently established care at clinic 02/26/2017. During that visit d/t multiple complaints, she was scheduled to return today for diabetes follow-up and review of prior labs.  Diabetes  Unknown of last A1C. Previously followed at [facility] Endocrinology. Diabetes medication regimen consisted of the following: Tradjenta 5 mg, Metformin 1000 mg, Januvia 100 mg, Glipizide 2.5 mg once with breakfast. Januvia is not covered by her insurance and therefore she has been off that medication for an indefinite period of time. Reports that she rans out of medications several weeks ago and was unable to get refills as she did not have the funds to pay an outstanding bill due to the practice. Reports no routine monitoring of glucose. Occasional checks glucose if she feels poorly. No routine physical exercise. Poor diet. Endorses routine daily intake of non-diet sodas and other foods that are high in sugar, fat, and salt. Reports over a year ago attending diabetes education and found it to be beneficial.   Social History   Social History  . Marital status: Married    Spouse name: N/A  . Number of children: N/A  . Years of education: N/A   Occupational History  . Not on file.   Social History Main Topics  . Smoking status: Never Smoker  . Smokeless tobacco:  Never Used  . Alcohol use No  . Drug use: No  . Sexual activity: Not on file   Other Topics Concern  . Not on file   Social History Narrative  . No narrative on file    Family History  Problem Relation Age of Onset  . Hypertension Mother   . Diabetes Mother   . Hypercholesterolemia Mother   . Hypertension Sister   . CVA Maternal Grandmother   . Hypertension Maternal Grandmother   . Diabetes Maternal Grandmother   . Heart attack Neg Hx    Review of Systems  See HPI  Patient Active Problem List   Diagnosis Date Noted  . Chest pain   . Benign essential HTN   . Cardiomyopathy ([place]) 03/16/2014  . Radiculopathy 09/07/2013  . Hypokalemia 06/02/2012  . HTN (hypertension) 06/01/2012  . DM (diabetes mellitus) ([place]) 06/01/2012  . Hyperlipidemia 06/01/2012  . Dyspnea on exertion 06/01/2012  . Obesity 06/01/2012    Allergies  Allergen Reactions  . Crestor [Rosuvastatin]     MUSCLE ACHES HEADACHES   . Hydrocodone Itching  . Other Hives, Swelling and Other (See Comments)    Vision changes; ALLERGIC TO ALL NUTS EXCEPT PEANUTS  . Oxycodone Itching  . Pravachol [Pravastatin Sodium] Itching    Joint pain  . Simvastatin Hives    Prior to Admission medications   Medication Sig Start Date End Date Taking? Authorizing Provider  albuterol (PROVENTIL HFA;VENTOLIN HFA) 108 (90 Base) MCG/ACT inhaler Inhale 2 puffs into the lungs every 4 (four) hours  as needed for wheezing or shortness of breath (cough, shortness of breath or wheezing.). 02/25/17   Scot Jun, FNP  amLODipine (NORVASC) 10 MG tablet Take 1 tablet (10 mg total) by mouth daily. 02/19/17   Doristine Devoid, PA-C  carvedilol (COREG) 25 MG tablet Take 1 tablet (25 mg total) by mouth daily. Patient taking differently: Take 37.5 mg by mouth 2 (two) times daily with a meal.  02/19/17   Leaphart, Zack Seal, PA-C  dextromethorphan-guaiFENesin (ROBITUSSIN-DM) 10-100 MG/5ML liquid Take 10 mLs by mouth every 4 (four) hours  as needed for cough.    [provider]  ferrous sulfate 325 (65 FE) MG tablet Take 325 mg by mouth 2 (two) times daily.    [provider]  linagliptin (TRADJENTA) 5 MG TABS tablet Take 1 tablet (5 mg total) by mouth daily. 02/19/17   Doristine Devoid, PA-C  losartan (COZAAR) 100 MG tablet Take 1 tablet (100 mg total) by mouth daily. 02/19/17   Doristine Devoid, PA-C  meloxicam (MOBIC) 7.5 MG tablet Take 2 tablets (15 mg total) by mouth daily. Patient taking differently: Take 7.5 mg by mouth daily.  02/25/17   Scot Jun, FNP  metFORMIN (GLUCOPHAGE) 1000 MG tablet Take 1 tablet (1,000 mg total) by mouth 2 (two) times daily with a meal. 02/19/17   Leaphart, Zack Seal, PA-C  Omega-3 Fatty Acids (FISH OIL) 1000 MG CPDR Take 3,000 mg by mouth daily. 02/25/17   Scot Jun, FNP  spironolactone (ALDACTONE) 25 MG tablet Take 1 tablet (25 mg total) by mouth daily. 02/19/17   Doristine Devoid, PA-C  Turmeric 500 MG CAPS Take 500 mg by mouth 2 (two) times daily.    [provider]  Vitamin D, Ergocalciferol, (DRISDOL) 50000 UNITS CAPS capsule Take 50,000 Units by mouth every 7 (seven) days. Mondays    [provider]    Past Medical, Surgical Family and Social History reviewed and updated.    Objective:  There were no vitals filed for this visit.  Wt Readings from Last 3 Encounters:  02/25/17 (!) 305 lb (138.3 kg)  02/19/17 (!) 305 lb (138.3 kg)  04/28/16 296 lb (134.3 kg)     Physical Exam  Constitutional: She is oriented to person, place, and time. She appears well-developed and well-nourished.  HENT:  Head: Normocephalic and atraumatic.  Eyes: Conjunctivae and EOM are normal. Pupils are equal, round, and reactive to light.  Neck: Normal range of motion. Neck supple. No thyromegaly present.  Cardiovascular: Normal rate, regular rhythm, normal heart sounds and intact distal pulses.   Pulmonary/Chest: Effort normal and breath sounds  normal.  Abdominal: Soft.  Neurological: She is alert and oriented to person, place, and time.  Skin: Skin is warm and dry.  Psychiatric: She has a normal mood and affect. Her behavior is normal. Judgment and thought content normal.    Assessment & Plan:  1. Type 2 diabetes mellitus without complication, with long-term current use of insulin (HCC) - POCT glycosylated hemoglobin (Hb A1C)-9.1 - Ambulatory referral to Ophthalmology - Ambulatory referral to diabetic education -Goal decrease weight below 300 by next follow-up  2. Proteinuria, unspecified type - Microalbumin, urine  3. Essential hypertension -continue medication as prescribed.  RTC: 3 months diabetes and hypertension or sooner any if acute care needs arrive.  Carroll Sage. Kenton Kingfisher, MSN, FNP-C [facility]  [address] Barbara Cower [place], [place] [address] [phone]

## 2017-03-01 NOTE — ED Provider Notes (Signed)
[facility] DEPT Provider Note   CSN: [id number] Arrival date & time: 03/01/17  0827     History   Chief Complaint Chief Complaint  Patient presents with  . Cough    HPI Christna Kulick is a 51 y.o. female.  Patient c/o non prod cough, and states earlier felt lightheaded/faint.  Cough for past 1-2 days. Episodic. Mild sore/scratchy throat, mild nasal congestion.  No fever or chills. Denies chest pain or discomfort, except that associated w coughing. No exertional cp or unusual doe. No pleuritic pain. Denies known ill contacts. No abd pain or nv. No leg pain or swelling. Non smoker.    The history is provided by the patient.  Shortness of Breath  Associated symptoms include sore throat and cough. Pertinent negatives include no fever, no headaches, no neck pain, no chest pain, no abdominal pain, no rash and no leg swelling.    Past Medical History:  Diagnosis Date  . Diabetes mellitus   . Fibroids   . Headache(784.0)   . Hypercholesterolemia   . Hyperlipidemia   . Hypertension   . Osteoarthritis    , with mild meniscus tear, followed by orthopedics--Dr Gladstone Lighter 04/29/10  . Right knee pain   . Seizures ([place])    as child  . Vitamin D deficiency     Patient Active Problem List   Diagnosis Date Noted  . Chest pain   . Benign essential HTN   . Cardiomyopathy ([place]) 03/16/2014  . Radiculopathy 09/07/2013  . Hypokalemia 06/02/2012  . HTN (hypertension) 06/01/2012  . DM (diabetes mellitus) ([place]) 06/01/2012  . Hyperlipidemia 06/01/2012  . Dyspnea on exertion 06/01/2012  . Obesity 06/01/2012    Past Surgical History:  Procedure Laterality Date  . ANTERIOR CERVICAL DECOMP/DISCECTOMY FUSION N/A 09/07/2013   Procedure: ANTERIOR CERVICAL DECOMPRESSION/DISCECTOMY FUSION 1 LEVEL;  Surgeon: Sinclair Ship, MD;  Location: [facility];  Service: Orthopedics;  Laterality: N/A;  Anterior cervical decompression fusion, cervical 5-6 with instrumentation and allograft  . LAPAROSCOPIC  ASSISTED VAGINAL HYSTERECTOMY  1/14  . SHOULDER SURGERY Right 11   rotator cuff  . TUBAL LIGATION    . UMBILICAL HERNIA REPAIR      OB History    Gravida Para Term Preterm AB Living   4 4 3 1   3    SAB TAB Ectopic Multiple Live Births                   Home Medications    Prior to Admission medications   Medication Sig Start Date End Date Taking? Authorizing Provider  albuterol (PROVENTIL HFA;VENTOLIN HFA) 108 (90 Base) MCG/ACT inhaler Inhale 2 puffs into the lungs every 4 (four) hours as needed for wheezing or shortness of breath (cough, shortness of breath or wheezing.). 02/25/17  Yes Scot Jun, FNP  amLODipine (NORVASC) 10 MG tablet Take 1 tablet (10 mg total) by mouth daily. 02/19/17  Yes Doristine Devoid, PA-C  carvedilol (COREG) 25 MG tablet Take 1 tablet (25 mg total) by mouth daily. Patient taking differently: Take 37.5 mg by mouth 2 (two) times daily with a meal.  02/19/17  Yes Leaphart, Zack Seal, PA-C  dextromethorphan-guaiFENesin (ROBITUSSIN-DM) 10-100 MG/5ML liquid Take 10 mLs by mouth every 4 (four) hours as needed for cough.   Yes [provider]  ferrous sulfate 325 (65 FE) MG tablet Take 325 mg by mouth 2 (two) times daily.   Yes [provider]  linagliptin (TRADJENTA) 5 MG TABS tablet Take 1 tablet (5  mg total) by mouth daily. 02/19/17  Yes Leaphart, Zack Seal, PA-C  losartan (COZAAR) 100 MG tablet Take 1 tablet (100 mg total) by mouth daily. 02/19/17  Yes Leaphart, Zack Seal, PA-C  meloxicam (MOBIC) 7.5 MG tablet Take 2 tablets (15 mg total) by mouth daily. Patient taking differently: Take 7.5 mg by mouth daily.  02/25/17  Yes Scot Jun, FNP  metFORMIN (GLUCOPHAGE) 1000 MG tablet Take 1 tablet (1,000 mg total) by mouth 2 (two) times daily with a meal. 02/19/17  Yes Leaphart, Zack Seal, PA-C  Omega-3 Fatty Acids (FISH OIL) 1000 MG CPDR Take 3,000 mg by mouth daily. 02/25/17  Yes Scot Jun, FNP  spironolactone (ALDACTONE) 25 MG  tablet Take 1 tablet (25 mg total) by mouth daily. 02/19/17  Yes Leaphart, Zack Seal, PA-C  Turmeric 500 MG CAPS Take 500 mg by mouth 2 (two) times daily.   Yes [provider]  Vitamin D, Ergocalciferol, (DRISDOL) 50000 UNITS CAPS capsule Take 50,000 Units by mouth every 7 (seven) days. Mondays   Yes [provider]    Family History Family History  Problem Relation Age of Onset  . Hypertension Mother   . Diabetes Mother   . Hypercholesterolemia Mother   . Hypertension Sister   . CVA Maternal Grandmother   . Hypertension Maternal Grandmother   . Diabetes Maternal Grandmother   . Heart attack Neg Hx     Social History Social History  Substance Use Topics  . Smoking status: Never Smoker  . Smokeless tobacco: Never Used  . Alcohol use No     Allergies   Crestor [rosuvastatin]; Hydrocodone; Other; Oxycodone; Pravachol [pravastatin sodium]; and Simvastatin   Review of Systems Review of Systems  Constitutional: Negative for fever.  HENT: Positive for sore throat. Negative for trouble swallowing.   Eyes: Negative for redness.  Respiratory: Positive for cough.   Cardiovascular: Negative for chest pain, palpitations and leg swelling.  Gastrointestinal: Negative for abdominal pain.  Genitourinary: Negative for flank pain.  Musculoskeletal: Negative for back pain and neck pain.  Skin: Negative for rash.  Neurological: Negative for headaches.  Hematological: Does not bruise/bleed easily.  Psychiatric/Behavioral: Negative for confusion.     Physical Exam Updated Vital Signs BP 127/85   Pulse 88   Temp 98.9 F (37.2 C) (Oral)   Resp 16   LMP 05/09/2012   SpO2 100%   Physical Exam  Constitutional: She appears well-developed and well-nourished. No distress.  HENT:  Mouth/Throat: Oropharynx is clear and moist.  Eyes: Conjunctivae are normal. No scleral icterus.  Neck: Neck supple. No tracheal deviation present.  No stiffness or rigidity    Cardiovascular: Normal rate, regular rhythm, normal heart sounds and intact distal pulses.  Exam reveals no gallop and no friction rub.   No murmur heard. Pulmonary/Chest: Effort normal and breath sounds normal. No respiratory distress.  Persistent non prod cough.   Abdominal: Soft. Normal appearance and bowel sounds are normal. She exhibits no distension. There is no tenderness.  Musculoskeletal: She exhibits no edema or tenderness.  Neurological: She is alert.  Skin: Skin is warm and dry. No rash noted. She is not diaphoretic.  Psychiatric: She has a normal mood and affect.  Nursing note and vitals reviewed.    ED Treatments / Results  Labs (all labs ordered are listed, but only abnormal results are displayed)  Results for orders placed or performed during the hospital encounter of 41/28/78  Basic metabolic panel  Result Value Ref Range  Sodium 137 135 - 145 mmol/L   Potassium 4.2 3.5 - 5.1 mmol/L   Chloride 99 (L) 101 - 111 mmol/L   CO2 30 22 - 32 mmol/L   Glucose, Bld 277 (H) 65 - 99 mg/dL   BUN 10 6 - 20 mg/dL   Creatinine, Ser 0.69 0.44 - 1.00 mg/dL   Calcium 8.8 (L) 8.9 - 10.3 mg/dL   GFR calc non Af Amer >60 >60 mL/min   GFR calc Af Amer >60 >60 mL/min   Anion gap 8 5 - 15  CBC  Result Value Ref Range   WBC 6.9 4.0 - 10.5 K/uL   RBC 5.38 (H) 3.87 - 5.11 MIL/uL   Hemoglobin 12.0 12.0 - 15.0 g/dL   HCT 38.0 36.0 - 46.0 %   MCV 70.6 (L) 78.0 - 100.0 fL   MCH 22.3 (L) 26.0 - 34.0 pg   MCHC 31.6 30.0 - 36.0 g/dL   RDW 14.5 11.5 - 15.5 %   Platelets 327 150 - 400 K/uL  I-stat troponin, ED  Result Value Ref Range   Troponin i, poc 0.00 0.00 - 0.08 ng/mL   Comment 3           Dg Chest 2 View  Result Date: 03/01/2017 CLINICAL DATA:  Chest pain EXAM: CHEST  2 VIEW COMPARISON:  02/19/2017 FINDINGS: Chronic cardiomegaly and aortic tortuosity. Chronic mild interstitial coarsening at the bases. There is no edema, consolidation, effusion, or pneumothorax. IMPRESSION:  Stable from prior.  No evidence of active disease. Cardiomegaly. Electronically Signed   By: Monte Fantasia M.D.   On: 03/01/2017 10:15   EKG  EKG Interpretation  Date/Time:  Monday March 01 2017 08:40:22 EDT Ventricular Rate:  85 PR Interval:    QRS Duration: 102 QT Interval:  399 QTC Calculation: 475 R Axis:   108 Text Interpretation:  Sinus rhythm Right axis deviation Low voltage, precordial leads No significant change since last tracing Confirmed by Ashok Cordia  MD, Lennette Bihari ([id number]) on 03/01/2017 11:56:11 AM       Radiology Dg Chest 2 View  Result Date: 03/01/2017 CLINICAL DATA:  Chest pain EXAM: CHEST  2 VIEW COMPARISON:  02/19/2017 FINDINGS: Chronic cardiomegaly and aortic tortuosity. Chronic mild interstitial coarsening at the bases. There is no edema, consolidation, effusion, or pneumothorax. IMPRESSION: Stable from prior.  No evidence of active disease. Cardiomegaly. Electronically Signed   By: Monte Fantasia M.D.   On: 03/01/2017 10:15    Procedures Procedures (including critical care time)  Medications Ordered in ED Medications  guaiFENesin (ROBITUSSIN) 100 MG/5ML solution 300 mg (300 mg Oral Given 03/01/17 1241)  acetaminophen (TYLENOL) tablet 1,000 mg (1,000 mg Oral Given 03/01/17 1221)     Initial Impression / Assessment and Plan / ED Course  I have reviewed the triage vital signs and the nursing notes.  Pertinent labs & imaging results that were available during my care of the patient were reviewed by me and considered in my medical decision making (see chart for details).  Robitussin for cough.  Po fluids.  Pt breathing comfortably, pulse ox 100%.    No pna on cxr.  Patient currently appears stable for d/c.   rec pcp f/u.     Final Clinical Impressions(s) / ED Diagnoses   Final diagnoses:  None    New Prescriptions New Prescriptions   No medications on file     Lajean Saver, MD 03/01/17 1300

## 2017-03-01 NOTE — Discharge Instructions (Signed)
It was our pleasure to provide your ER care today - we hope that you feel better.  Rest. Drink plenty of fluids.  Take mucinex or other over the counter cold/cough medication as need for relief of your symptoms.   Follow up with primary care doctor in 1 week if symptoms fail to improve/resolve.  Return to ER if worse, new symptoms, increased difficulty breathing, weak/fainting, chest pain, other concern.

## 2017-03-01 NOTE — Patient Instructions (Addendum)
For diabetes continue: Tradjenta 5 mg once daily.  Metformin 1000 mg twice daily with meals.  Adding, Glipizide 2.5 mg twice daily with meals.  Start checking fasting glucose in the morning and check blood sugar daily at bedtime.  Your A1C Goal is 9.1  If readings are consistent greater than 200 upon awakening or 2 hours after meals, call the office to notify me.  Transition from regular sodas to diet soda's to prevent excess intake of simple sugar. I am referring you back to diabetes nutrition and have referred you to opthalmology.      Blood Glucose Monitoring, Adult Monitoring your blood sugar (glucose) helps you manage your diabetes. It also helps you and your health care provider determine how well your diabetes management plan is working. Blood glucose monitoring involves checking your blood glucose as often as directed, and keeping a record (log) of your results over time. Why should I monitor my blood glucose? Checking your blood glucose regularly can:  Help you understand how food, exercise, illnesses, and medicines affect your blood glucose.  Let you know what your blood glucose is at any time. You can quickly tell if you are having low blood glucose (hypoglycemia) or high blood glucose (hyperglycemia).  Help you and your health care provider adjust your medicines as needed.  When should I check my blood glucose? Follow instructions from your health care provider about how often to check your blood glucose. This may depend on:  The type of diabetes you have.  How well-controlled your diabetes is.  Medicines you are taking.  If you have type 1 diabetes:  Check your blood glucose at least 2 times a day.  Also check your blood glucose: ? Before every insulin injection. ? Before and after exercise. ? Between meals. ? 2 hours after a meal. ? Occasionally between 2:00 a.m. and 3:00 a.m., as directed. ? Before potentially dangerous tasks, like driving or using  heavy machinery. ? At bedtime.  You may need to check your blood glucose more often, up to 6-10 times a day: ? If you use an insulin pump. ? If you need multiple daily injections (MDI). ? If your diabetes is not well-controlled. ? If you are ill. ? If you have a history of severe hypoglycemia. ? If you have a history of not knowing when your blood glucose is getting low (hypoglycemia unawareness). If you have type 2 diabetes:  If you take insulin or other diabetes medicines, check your blood glucose at least 2 times a day.  If you are on intensive insulin therapy, check your blood glucose at least 4 times a day. Occasionally, you may also need to check between 2:00 a.m. and 3:00 a.m., as directed.  Also check your blood glucose: ? Before and after exercise. ? Before potentially dangerous tasks, like driving or using heavy machinery.  You may need to check your blood glucose more often if: ? Your medicine is being adjusted. ? Your diabetes is not well-controlled. ? You are ill. What is a blood glucose log?  A blood glucose log is a record of your blood glucose readings. It helps you and your health care provider: ? Look for patterns in your blood glucose over time. ? Adjust your diabetes management plan as needed.  Every time you check your blood glucose, write down your result and notes about things that may be affecting your blood glucose, such as your diet and exercise for the day.  Most glucose meters store a record  of glucose readings in the meter. Some meters allow you to download your records to a computer. How do I check my blood glucose? Follow these steps to get accurate readings of your blood glucose: Supplies needed   Blood glucose meter.  Test strips for your meter. Each meter has its own strips. You must use the strips that come with your meter.  A needle to prick your finger (lancet). Do not use lancets more than once.  A device that holds the lancet (lancing  device).  A journal or log book to write down your results. Procedure  Wash your hands with soap and water.  Prick the side of your finger (not the tip) with the lancet. Use a different finger each time.  Gently rub the finger until a small drop of blood appears.  Follow instructions that come with your meter for inserting the test strip, applying blood to the strip, and using your blood glucose meter.  Write down your result and any notes. Alternative testing sites  Some meters allow you to use areas of your body other than your finger (alternative sites) to test your blood.  If you think you may have hypoglycemia, or if you have hypoglycemia unawareness, do not use alternative sites. Use your finger instead.  Alternative sites may not be as accurate as the fingers, because blood flow is slower in these areas. This means that the result you get may be delayed, and it may be different from the result that you would get from your finger.  The most common alternative sites are: ? Forearm. ? Thigh. ? Palm of the hand. Additional tips  Always keep your supplies with you.  If you have questions or need help, all blood glucose meters have a 24-hour "hotline" number that you can call. You may also contact your health care provider.  After you use a few boxes of test strips, adjust (calibrate) your blood glucose meter by following instructions that came with your meter. This information is not intended to replace advice given to you by your health care provider. Make sure you discuss any questions you have with your health care provider. Document Released: 09/17/2003 Document Revised: 04/03/2016 Document Reviewed: 02/24/2016 Elsevier Interactive Patient Education  2017 [place] for Diabetes Mellitus, Adult Carbohydrate counting is a method for keeping track of how many carbohydrates you eat. Eating carbohydrates naturally increases the amount of sugar  (glucose) in the blood. Counting how many carbohydrates you eat helps keep your blood glucose within normal limits, which helps you manage your diabetes (diabetes mellitus). It is important to know how many carbohydrates you can safely have in each meal. This is different for every person. A diet and nutrition specialist (registered dietitian) can help you make a meal plan and calculate how many carbohydrates you should have at each meal and snack. Carbohydrates are found in the following foods: Grains, such as breads and cereals. Dried beans and soy products. Starchy vegetables, such as potatoes, peas, and corn. Fruit and fruit juices. Milk and yogurt. Sweets and snack foods, such as cake, cookies, candy, chips, and soft drinks.  How do I count carbohydrates? There are two ways to count carbohydrates in food. You can use either of the methods or a combination of both. Reading "Nutrition Facts" on packaged food The "Nutrition Facts" list is included on the labels of almost all packaged foods and beverages in the [place] It includes: The serving size. Information about nutrients  in each serving, including the grams (g) of carbohydrate per serving.  To use the "Nutrition Facts": Decide how many servings you will have. Multiply the number of servings by the number of carbohydrates per serving. The resulting number is the total amount of carbohydrates that you will be having.  Learning standard serving sizes of other foods When you eat foods containing carbohydrates that are not packaged or do not include "Nutrition Facts" on the label, you need to measure the servings in order to count the amount of carbohydrates: Measure the foods that you will eat with a food scale or measuring cup, if needed. Decide how many standard-size servings you will eat. Multiply the number of servings by 15. Most carbohydrate-rich foods have about 15 g of carbohydrates per serving. For example, if you eat 8 oz (170 g)  of strawberries, you will have eaten 2 servings and 30 g of carbohydrates (2 servings x 15 g = 30 g). For foods that have more than one food mixed, such as soups and casseroles, you must count the carbohydrates in each food that is included.  The following list contains standard serving sizes of common carbohydrate-rich foods. Each of these servings has about 15 g of carbohydrates:  hamburger bun or  English muffin.  oz (15 mL) syrup.  oz (14 g) jelly. 1 slice of bread. 1 six-inch tortilla. 3 oz (85 g) cooked rice or pasta. 4 oz (113 g) cooked dried beans. 4 oz (113 g) starchy vegetable, such as peas, corn, or potatoes. 4 oz (113 g) hot cereal. 4 oz (113 g) mashed potatoes or  of a large baked potato. 4 oz (113 g) canned or frozen fruit. 4 oz (120 mL) fruit juice. 4-6 crackers. 6 chicken nuggets. 6 oz (170 g) unsweetened dry cereal. 6 oz (170 g) plain fat-free yogurt or yogurt sweetened with artificial sweeteners. 8 oz (240 mL) milk. 8 oz (170 g) fresh fruit or one small piece of fruit. 24 oz (680 g) popped popcorn.  Example of carbohydrate counting Sample meal 3 oz (85 g) chicken breast. 6 oz (170 g) brown rice. 4 oz (113 g) corn. 8 oz (240 mL) milk. 8 oz (170 g) strawberries with sugar-free whipped topping. Carbohydrate calculation Identify the foods that contain carbohydrates: Rice. Corn. Milk. Strawberries. Calculate how many servings you have of each food: 2 servings rice. 1 serving corn. 1 serving milk. 1 serving strawberries. Multiply each number of servings by 15 g: 2 servings rice x 15 g = 30 g. 1 serving corn x 15 g = 15 g. 1 serving milk x 15 g = 15 g. 1 serving strawberries x 15 g = 15 g. Add together all of the amounts to find the total grams of carbohydrates eaten: 30 g + 15 g + 15 g + 15 g = 75 g of carbohydrates total. This information is not intended to replace advice given to you by your health care provider. Make sure you discuss any questions  you have with your health care provider. Document Released: 09/14/2005 Document Revised: 04/03/2016 Document Reviewed: 02/26/2016 Elsevier Interactive Patient Education  2018 [place] Heart-healthy meal planning includes:  Limiting unhealthy fats.  Increasing healthy fats.  Making other small dietary changes.  You may need to talk with your doctor or a diet specialist (dietitian) to create an eating plan that is right for you. What types of fat should I choose?  Choose healthy fats. These include olive oil and canola oil,  flaxseeds, walnuts, almonds, and seeds.  Eat more omega-3 fats. These include salmon, mackerel, sardines, tuna, flaxseed oil, and ground flaxseeds. Try to eat fish at least twice each week.  Limit saturated fats. ? Saturated fats are often found in animal products, such as meats, butter, and cream. ? Plant sources of saturated fats include palm oil, palm kernel oil, and coconut oil.  Avoid foods with partially hydrogenated oils in them. These include stick margarine, some tub margarines, cookies, crackers, and other baked goods. These contain trans fats. What general guidelines do I need to follow?  Check food labels carefully. Identify foods with trans fats or high amounts of saturated fat.  Fill one half of your plate with vegetables and green salads. Eat 4-5 servings of vegetables per day. A serving of vegetables is: ? 1 cup of raw leafy vegetables. ?  cup of raw or cooked cut-up vegetables. ?  cup of vegetable juice.  Fill one fourth of your plate with whole grains. Look for the word "whole" as the first word in the ingredient list.  Fill one fourth of your plate with lean protein foods.  Eat 4-5 servings of fruit per day. A serving of fruit is: ? One medium whole fruit. ?  cup of dried fruit. ?  cup of fresh, frozen, or canned fruit. ?  cup of 100% fruit juice.  Eat more foods that contain soluble fiber. These  include apples, broccoli, carrots, beans, peas, and barley. Try to get 20-30 g of fiber per day.  Eat more home-cooked food. Eat less restaurant, buffet, and fast food.  Limit or avoid alcohol.  Limit foods high in starch and sugar.  Avoid fried foods.  Avoid frying your food. Try baking, boiling, grilling, or broiling it instead. You can also reduce fat by: ? Removing the skin from poultry. ? Removing all visible fats from meats. ? Skimming the fat off of stews, soups, and gravies before serving them. ? Steaming vegetables in water or broth.  Lose weight if you are overweight.  Eat 4-5 servings of nuts, legumes, and seeds per week: ? One serving of dried beans or legumes equals  cup after being cooked. ? One serving of nuts equals 1 ounces. ? One serving of seeds equals  ounce or one tablespoon.  You may need to keep track of how much salt or sodium you eat. This is especially true if you have high blood pressure. Talk with your doctor or dietitian to get more information. What foods can I eat? Grains Breads, including [place], white, pita, wheat, raisin, rye, oatmeal, and [place]. Tortillas that are neither fried nor made with lard or trans fat. Low-fat rolls, including hotdog and hamburger buns and English muffins. Biscuits. Muffins. Waffles. Pancakes. Light popcorn. Whole-grain cereals. Flatbread. Melba toast. Pretzels. Breadsticks. Rusks. Low-fat snacks. Low-fat crackers, including oyster, saltine, matzo, graham, animal, and rye. Rice and pasta, including brown rice and pastas that are made with whole wheat. Vegetables All vegetables. Fruits All fruits, but limit coconut. Meats and Other Protein Sources Lean, well-trimmed beef, veal, pork, and lamb. Chicken and [place] without skin. All fish and shellfish. Wild duck, rabbit, pheasant, and venison. Egg whites or low-cholesterol egg substitutes. Dried beans, peas, lentils, and tofu. Seeds and most nuts. Dairy Low-fat or nonfat  cheeses, including ricotta, string, and mozzarella. Skim or 1% milk that is liquid, powdered, or evaporated. Buttermilk that is made with low-fat milk. Nonfat or low-fat yogurt. Beverages Mineral water. Diet carbonated beverages. Sweets and Desserts  [place] and fruit ices. Honey, jam, marmalade, jelly, and syrups. Meringues and gelatins. Pure sugar candy, such as hard candy, jelly beans, gumdrops, mints, marshmallows, and small amounts of dark chocolate. [employer] Eat all sweets and desserts in moderation. Fats and Oils Nonhydrogenated (trans-free) margarines. Vegetable oils, including soybean, sesame, sunflower, olive, peanut, safflower, corn, canola, and cottonseed. Salad dressings or mayonnaise made with a vegetable oil. Limit added fats and oils that you use for cooking, baking, salads, and as spreads. Other Cocoa powder. Coffee and tea. All seasonings and condiments. The items listed above may not be a complete list of recommended foods or beverages. Contact your dietitian for more options. What foods are not recommended? Grains Breads that are made with saturated or trans fats, oils, or whole milk. Croissants. Butter rolls. Cheese breads. Sweet rolls. Donuts. Buttered popcorn. Chow mein noodles. High-fat crackers, such as cheese or butter crackers. Meats and Other Protein Sources Fatty meats, such as hotdogs, short ribs, sausage, spareribs, bacon, rib eye roast or steak, and mutton. High-fat deli meats, such as salami and bologna. Caviar. Domestic duck and goose. Organ meats, such as kidney, liver, sweetbreads, and heart. Dairy Cream, sour cream, cream cheese, and creamed cottage cheese. Whole-milk cheeses, including blue (bleu), Monte[place] Jack, [place], Buna, American, [place], Swiss, cheddar, [place], and [place]. Whole or 2% milk that is liquid, evaporated, or condensed. Whole buttermilk. Cream sauce or high-fat cheese sauce. Yogurt that is made from whole milk. Beverages Regular  sodas and juice drinks with added sugar. Sweets and Desserts Frosting. Pudding. Cookies. Cakes other than angel food cake. Candy that has milk chocolate or white chocolate, hydrogenated fat, butter, coconut, or unknown ingredients. Buttered syrups. Full-fat ice cream or ice cream drinks. Fats and Oils Gravy that has suet, meat fat, or shortening. Cocoa butter, hydrogenated oils, palm oil, coconut oil, palm kernel oil. These can often be found in baked products, candy, fried foods, nondairy creamers, and whipped toppings. Solid fats and shortenings, including bacon fat, salt pork, lard, and butter. Nondairy cream substitutes, such as coffee creamers and sour cream substitutes. Salad dressings that are made of unknown oils, cheese, or sour cream. The items listed above may not be a complete list of foods and beverages to avoid. Contact your dietitian for more information. This information is not intended to replace advice given to you by your health care provider. Make sure you discuss any questions you have with your health care provider. Document Released: 03/15/2012 Document Revised: 02/20/2016 Document Reviewed: 03/08/2014 Elsevier Interactive Patient Education  [employer].

## 2017-03-01 NOTE — ED Triage Notes (Signed)
Per pt, states dry cough, chest tightness, on and off for weeks-states she was at work today and broke out in cold sweat-states she wasn't doing anything physically

## 2017-03-01 NOTE — ED Notes (Signed)
ED Provider at bedside. 

## 2017-03-02 LAB — MICROALBUMIN, URINE: Microalb, Ur: 3.7 mg/dL

## 2017-03-03 MED ORDER — LOSARTAN POTASSIUM 100 MG PO TABS: 100.0000 mg | ORAL_TABLET | Freq: Every day | ORAL | 0 refills | Status: DC

## 2017-03-03 MED ORDER — CARVEDILOL 25 MG PO TABS: 25.0000 mg | ORAL_TABLET | Freq: Two times a day (BID) | ORAL | 0 refills | Status: DC

## 2017-03-03 MED ORDER — GLIPIZIDE 5 MG PO TABS: 5.0000 mg | ORAL_TABLET | Freq: Two times a day (BID) | ORAL | 0 refills | Status: DC

## 2017-03-03 MED ORDER — AMLODIPINE BESYLATE 10 MG PO TABS: 10.0000 mg | ORAL_TABLET | Freq: Every day | ORAL | 0 refills | Status: DC

## 2017-03-03 MED ORDER — METFORMIN HCL 1000 MG PO TABS: 1000.0000 mg | ORAL_TABLET | Freq: Two times a day (BID) | ORAL | 0 refills | Status: DC

## 2017-03-03 MED ORDER — MELOXICAM 7.5 MG PO TABS: 7.5000 mg | ORAL_TABLET | Freq: Every day | ORAL | 1 refills | Status: DC

## 2017-03-03 MED ORDER — LINAGLIPTIN 5 MG PO TABS: 5.0000 mg | ORAL_TABLET | Freq: Every day | ORAL | 0 refills | Status: DC

## 2017-03-03 MED ORDER — HYDROCHLOROTHIAZIDE 25 MG PO TABS: 25.0000 mg | ORAL_TABLET | Freq: Every day | ORAL | 3 refills | Status: DC

## 2017-03-04 ENCOUNTER — Other Ambulatory Visit: Payer: Self-pay | Admitting: Family Medicine

## 2017-03-04 MED ORDER — CARVEDILOL 25 MG PO TABS: 25.0000 mg | ORAL_TABLET | Freq: Two times a day (BID) | ORAL | 0 refills | Status: DC

## 2017-03-04 MED ORDER — METFORMIN HCL 1000 MG PO TABS: 1000.0000 mg | ORAL_TABLET | Freq: Two times a day (BID) | ORAL | 0 refills | Status: DC

## 2017-03-04 MED ORDER — LOSARTAN POTASSIUM 100 MG PO TABS: 100.0000 mg | ORAL_TABLET | Freq: Every day | ORAL | 0 refills | Status: DC

## 2017-03-04 NOTE — Progress Notes (Signed)
Resent medication refills

## 2017-03-16 ENCOUNTER — Ambulatory Visit: Payer: Managed Care, Other (non HMO) | Admitting: Nurse Practitioner

## 2017-04-12 ENCOUNTER — Ambulatory Visit (INDEPENDENT_AMBULATORY_CARE_PROVIDER_SITE_OTHER): Payer: Managed Care, Other (non HMO) | Admitting: Nurse Practitioner

## 2017-04-12 ENCOUNTER — Encounter: Payer: Self-pay | Admitting: Nurse Practitioner

## 2017-04-12 ENCOUNTER — Encounter (INDEPENDENT_AMBULATORY_CARE_PROVIDER_SITE_OTHER): Payer: Self-pay

## 2017-04-12 VITALS — BP 150/90 | HR 100 | Ht 68.0 in | Wt 309.0 lb

## 2017-04-12 DIAGNOSIS — E7800 Pure hypercholesterolemia, unspecified: Secondary | ICD-10-CM | POA: Diagnosis not present

## 2017-04-12 DIAGNOSIS — I428 Other cardiomyopathies: Secondary | ICD-10-CM

## 2017-04-12 DIAGNOSIS — I10 Essential (primary) hypertension: Secondary | ICD-10-CM | POA: Diagnosis not present

## 2017-04-12 MED ORDER — CARVEDILOL 25 MG PO TABS: 37.5000 mg | ORAL_TABLET | Freq: Two times a day (BID) | ORAL | 6 refills | Status: DC

## 2017-04-12 NOTE — Patient Instructions (Addendum)
We will be checking the following labs today - BMET and BNP   Medication Instructions:    Continue with your current medicines. BUT  Increase the Coreg to a pill and a half (37.5 mg) to take twice a day  Take your HCTZ and Aldactone in the mornings  STOP all NSAIDs - Advil, Aleve, Ibuprofen    Testing/Procedures To Be Arranged:  Echocardiogram  Follow-Up:   See Dr. Marlou Porch for discussion    Other Special Instructions:   N/A    If you need a refill on your cardiac medications before your next appointment, please call your pharmacy.   Call the [facility] office at [phone] if you have any questions, problems or concerns.

## 2017-04-12 NOTE — Progress Notes (Signed)
CARDIOLOGY OFFICE NOTE  Date:  04/12/2017    Claudia [name] Date of Birth: [birth year] Medical Record #[id number]  PCP:  Claudia Jun, FNP  Cardiologist:  Claudia [name]  Chief Complaint  Patient presents with  . Cardiomyopathy  . Chest Pain  . Hypertension    Post [name] visit - seen for Dr. Marlou Porch    History of Present Illness: Claudia [name] is a 51 y.o. female who presents today for a post [name] visit. Seen for Dr. Marlou Porch.   She has a history of hypertension, diabetes, hyperlipidemia, presumed nonischemic cardiomyopathy, systolic LV dysfunction ejection fraction 40-45% with moderate LVH in the setting of hypertensive urgency with normal ejection fraction 55% on echocardiogram 2016.   Last seen back in August of 2017. Felt to be doing ok. To be seen again this August - on recall list.   Admitted back in May - had ran out of her medicines (has done this previously) and had progressive SOB, headache and chest fullness. She was hypertensive. Home medicines resumed. Troponin negative. EKG negative. Back in the ER again in June for cough. Asked to follow up here.   Comes in today. Here with her husband but he ended up leaving. She is somewhat defensive. She has not gotten her Coreg yet but then tells me she is taking. HR still not controlled. She is gaining weight. She feels like her weight is up due to the medicines. Says she is not going to go on insulin.  Still short of breath and will still have chest tightness that comes and goes. Using 3 pillows at night. She does eat out. Using Aleve as well for knee pain.   Past Medical History:  Diagnosis Date  . Diabetes mellitus   . Fibroids   . Headache(784.0)   . Hypercholesterolemia   . Hyperlipidemia   . Hypertension   . Osteoarthritis    , with mild meniscus tear, followed by orthopedics--Dr Gladstone Lighter 04/29/10  . Right knee pain   . Seizures ([place])    as child  . Vitamin D deficiency     Past Surgical History:  Procedure  Laterality Date  . ANTERIOR CERVICAL DECOMP/DISCECTOMY FUSION N/A 09/07/2013   Procedure: ANTERIOR CERVICAL DECOMPRESSION/DISCECTOMY FUSION 1 LEVEL;  Surgeon: Sinclair Ship, MD;  Location: [facility];  Service: Orthopedics;  Laterality: N/A;  Anterior cervical decompression fusion, cervical 5-6 with instrumentation and allograft  . LAPAROSCOPIC ASSISTED VAGINAL HYSTERECTOMY  1/14  . SHOULDER SURGERY Right 11   rotator cuff  . TUBAL LIGATION    . UMBILICAL HERNIA REPAIR       Medications: Current Meds  Medication Sig  . albuterol (PROVENTIL HFA;VENTOLIN HFA) 108 (90 Base) MCG/ACT inhaler Inhale 2 puffs into the lungs every 4 (four) hours as needed for wheezing or shortness of breath (cough, shortness of breath or wheezing.).  Marland Kitchen amLODipine (NORVASC) 10 MG tablet Take 1 tablet (10 mg total) by mouth daily.  . benzonatate (TESSALON) 100 MG capsule Take 1-2 capsules (100-200 mg total) by mouth 3 (three) times daily as needed for cough.  . blood glucose meter kit and supplies KIT Dispense based on patient and insurance preference. Use up to four times daily as directed. (FOR ICD-9 250.00, 250.01). Check blood sugar every morning and at bedtime.  As needed if sick.  . carvedilol (COREG) 25 MG tablet Take 1.5 tablets (37.5 mg total) by mouth 2 (two) times daily with a meal.  . ferrous sulfate 325 (65 FE) MG tablet Take  325 mg by mouth 2 (two) times daily.  Marland Kitchen glipiZIDE (GLUCOTROL) 5 MG tablet Take 1 tablet (5 mg total) by mouth 2 (two) times daily before a meal.  . hydrochlorothiazide (HYDRODIURIL) 25 MG tablet Take 1 tablet (25 mg total) by mouth daily.  Marland Kitchen linagliptin (TRADJENTA) 5 MG TABS tablet Take 1 tablet (5 mg total) by mouth daily.  Marland Kitchen losartan (COZAAR) 100 MG tablet Take 1 tablet (100 mg total) by mouth daily.  . meloxicam (MOBIC) 7.5 MG tablet Take 1 tablet (7.5 mg total) by mouth daily.  . metFORMIN (GLUCOPHAGE) 1000 MG tablet Take 1 tablet (1,000 mg total) by mouth 2 (two) times daily  with a meal.  . Omega-3 Fatty Acids (FISH OIL) 1000 MG CPDR Take 3,000 mg by mouth daily.  Marland Kitchen spironolactone (ALDACTONE) 25 MG tablet Take 25 mg by mouth daily.   . Turmeric 500 MG CAPS Take 500 mg by mouth 2 (two) times daily.  . Vitamin D, Ergocalciferol, (DRISDOL) 50000 units CAPS capsule Take 1 capsule (50,000 Units total) by mouth every 7 (seven) days.  . [DISCONTINUED] carvedilol (COREG) 25 MG tablet Take 1 tablet (25 mg total) by mouth 2 (two) times daily with a meal.  . [DISCONTINUED] dextromethorphan-guaiFENesin (ROBITUSSIN-DM) 10-100 MG/5ML liquid Take 10 mLs by mouth every 4 (four) hours as needed for cough.     Allergies: Allergies  Allergen Reactions  . Crestor [Rosuvastatin]     MUSCLE ACHES HEADACHES   . Hydrocodone Itching  . Other Hives, Swelling and Other (See Comments)    Vision changes; ALLERGIC TO ALL NUTS EXCEPT PEANUTS  . Oxycodone Itching  . Pravachol [Pravastatin Sodium] Itching    Joint pain  . Simvastatin Hives    Social History: The patient  reports that she has never smoked. She has never used smokeless tobacco. She reports that she does not drink alcohol or use drugs.   Family History: The patient's family history includes CVA in her maternal grandmother; Diabetes in her maternal grandmother and mother; Hypercholesterolemia in her mother; Hypertension in her maternal grandmother, mother, and sister.   Review of Systems: Please see the history of present illness.   Otherwise, the review of systems is positive for none.   All other systems are reviewed and negative.   Physical Exam: VS:  BP (!) 150/90 (BP Location: Left Arm, Patient Position: Sitting, Cuff Size: Normal)   Pulse 100   Ht _0  (1.727 m)   Wt (!) 309 lb (140.2 kg)   LMP 05/09/2012   SpO2 97% Comment: at rest  BMI 46.98 kg/m  .  BMI Body mass index is 46.98 kg/m.  Wt Readings from Last 3 Encounters:  04/12/17 (!) 309 lb (140.2 kg)  03/01/17 (!) 304 lb 6.4 oz (138.1 kg)  02/25/17  (!) 305 lb (138.3 kg)   BP palpated by me is 818 systolic.   General: Pleasant. Well developed, well nourished and in no acute distress.   HEENT: Normal.  Neck: Supple, no JVD, carotid bruits, or masses noted.  Cardiac: Regular rate and rhythm. HR is a little fast. Legs are full. 1+ edema.  Respiratory:  Lungs are clear to auscultation bilaterally with normal work of breathing.  GI: Soft and nontender.  MS: No deformity or atrophy. Gait and ROM intact.  Skin: Warm and dry. Color is normal.  Neuro:  Strength and sensation are intact and no gross focal deficits noted.  Psych: Alert, appropriate and with normal affect.   LABORATORY DATA:  EKG:  EKG is not ordered today.  Lab Results  Component Value Date   WBC 6.9 03/01/2017   HGB 12.0 03/01/2017   HCT 38.0 03/01/2017   PLT 327 03/01/2017   GLUCOSE 277 (H) 03/01/2017   CHOL 173 02/25/2017   TRIG 123 02/25/2017   HDL 46 (L) 02/25/2017   LDLCALC 102 (H) 02/25/2017   ALT 12 02/25/2017   AST 11 02/25/2017   NA 137 03/01/2017   K 4.2 03/01/2017   CL 99 (L) 03/01/2017   CREATININE 0.69 03/01/2017   BUN 10 03/01/2017   CO2 30 03/01/2017   TSH 0.52 02/25/2017   INR 0.86 09/05/2013   HGBA1C 9.1 03/01/2017   MICROALBUR 3.7 03/01/2017     BNP (last 3 results)  Recent Labs  02/19/17 1104 02/25/17 1536  BNP 23.7 6.2    ProBNP (last 3 results) No results for input(s): PROBNP in the last 8760 hours.   Other Studies Reviewed Today:  ECHO 03/04/15: - Left ventricle: The cavity size was normal. Systolic function was 01-00% normal. Wall motion was normal; there were no regional wall motion abnormalities. There was an increased relative contribution of atrial contraction to ventricular filling. Doppler parameters are consistent with abnormal left ventricular relaxation (grade 1 diastolic dysfunction). - Atrial septum: No defect or patent foramen ovale was identified.  Assessment/Plan:  1. NICM - unclear as to  whether she is actually back on her regimen. HR still up. Still with lots of symptoms. Increasing Coreg today. Update her echo. Will get her back to see Dr. Marlou Porch. She will get repeat labs today as well. Asked her to stop the NSAIDs. She seems to have poor insight into the significance of her medical issues.   2. Chest pain: felt to be due to running out of her medicines - lots of risk factors - may need further testing if not resolved when BP more controlled.   3.  HTN:  Compliance stressed to the patient. Coreg increased today. Tells me she will not start additional medicine.   4. DM: uncontrolled per recent A1C - on metformin, follow up with PCP appt  5. Obesity: discussed the need for diet and exercise.   Current medicines are reviewed with the patient today.  The patient does not have concerns regarding medicines other than what has been noted above.  The following changes have been made:  See above.  Labs/ tests ordered today include:    Orders Placed This Encounter  Procedures  . Basic metabolic panel  . Pro b natriuretic peptide (BNP)  . ECHOCARDIOGRAM COMPLETE     Disposition:   FU with Dr. Marlou Porch after the echo for further med titration and discussion of echo.   Patient is agreeable to this plan and will call if any problems develop in the interim.   SignedTruitt Merle, NP  04/12/2017 8:26 AM  [facility] [address] [place], [place]  [address] Phone: [phone] Fax: [phone]

## 2017-04-13 ENCOUNTER — Telehealth: Payer: Self-pay

## 2017-04-13 LAB — BASIC METABOLIC PANEL
BUN/Creatinine Ratio: 17 (ref 9–23)
BUN: 9 mg/dL (ref 6–24)
CO2: 27 mmol/L (ref 20–29)
Calcium: 9.8 mg/dL (ref 8.7–10.2)
Chloride: 94 mmol/L — ABNORMAL LOW (ref 96–106)
Creatinine, Ser: 0.52 mg/dL — ABNORMAL LOW (ref 0.57–1.00)
GFR calc Af Amer: 130 mL/min/{1.73_m2} (ref >59)
GFR calc non Af Amer: 113 mL/min/{1.73_m2} (ref >59)
Glucose: 202 mg/dL — ABNORMAL HIGH (ref 65–99)
Potassium: 4.8 mmol/L (ref 3.5–5.2)
Sodium: 142 mmol/L (ref 134–144)

## 2017-04-13 LAB — PRO B NATRIURETIC PEPTIDE: NT-Pro BNP: 12 pg/mL (ref 0–249)

## 2017-04-13 MED ORDER — METFORMIN HCL 1000 MG PO TABS: 1000.0000 mg | ORAL_TABLET | Freq: Two times a day (BID) | ORAL | 0 refills | Status: DC

## 2017-04-13 NOTE — Telephone Encounter (Signed)
Metformin sent to [facility]

## 2017-04-20 ENCOUNTER — Ambulatory Visit (HOSPITAL_COMMUNITY): Payer: Managed Care, Other (non HMO) | Attending: Cardiology

## 2017-04-20 ENCOUNTER — Other Ambulatory Visit: Payer: Self-pay

## 2017-04-20 ENCOUNTER — Encounter: Payer: Self-pay | Admitting: Physician Assistant

## 2017-04-20 DIAGNOSIS — I069 Rheumatic aortic valve disease, unspecified: Secondary | ICD-10-CM | POA: Insufficient documentation

## 2017-04-20 DIAGNOSIS — I428 Other cardiomyopathies: Secondary | ICD-10-CM

## 2017-04-20 DIAGNOSIS — I5030 Unspecified diastolic (congestive) heart failure: Secondary | ICD-10-CM | POA: Insufficient documentation

## 2017-04-20 LAB — ECHOCARDIOGRAM COMPLETE
E decel time: 208 msec
E/e' ratio: 9
FS: 33 % (ref 28–44)
IVS/LV PW RATIO, ED: 0.83
LA ID, A-P, ES: 48 mm
LA diam end sys: 48 mm
LA diam index: 1.95 cm/m2
LA vol A4C: 55 ml
LA vol index: 24.4 mL/m2
LA vol: 60 mL
LV E/e' medial: 9
LV E/e'average: 9
LV PW d: 14.6 mm — AB (ref 0.6–1.1)
LV e' LATERAL: 8.22 cm/s
LVOT SV: 86 mL
LVOT VTI: 20.8 cm
LVOT area: 4.15 cm2
LVOT diameter: 23 mm
LVOT peak grad rest: 4 mmHg
LVOT peak vel: 99.4 cm/s
MV Dec: 208
MV Peak grad: 2 mmHg
MV pk A vel: 86.4 m/s
MV pk E vel: 74 m/s
TDI e' lateral: 8.22
TDI e' medial: 7.35

## 2017-06-01 ENCOUNTER — Ambulatory Visit: Payer: Managed Care, Other (non HMO) | Admitting: Family Medicine

## 2017-06-07 ENCOUNTER — Ambulatory Visit (INDEPENDENT_AMBULATORY_CARE_PROVIDER_SITE_OTHER): Payer: Managed Care, Other (non HMO) | Admitting: Cardiology

## 2017-06-07 ENCOUNTER — Encounter: Payer: Self-pay | Admitting: Cardiology

## 2017-06-07 DIAGNOSIS — I10 Essential (primary) hypertension: Secondary | ICD-10-CM | POA: Diagnosis not present

## 2017-06-07 NOTE — Patient Instructions (Signed)
Medication Instructions:  No changes. Your current medication regimen is effective  Labwork: None today  Testing/Procedures: None today  Follow-Up: 6 months with Truitt Merle. 1 year with Dr. Marlou Porch.   If you need a refill on your cardiac medications before your next appointment, please call your pharmacy.

## 2017-06-07 NOTE — Progress Notes (Signed)
[facility]. [address], [address], [place]  [address] Phone: [phone] Fax:  [phone]  Date:  06/07/2017   ID:  Claudia [name], DOB August 05, 1966, MRN [id number]  PCP:  Scot Jun, FNP   History of Present Illness: Claudia [name] is a 51 y.o. female with hypertension, diabetes, hyperlipidemia, cardiomyopathy, systolic LV dysfunction ejection fraction 40-45% with moderate LVH in the setting of hypertensive urgency now with normal ejection fraction 55% on echocardiogram 2016 and 2018.   Continued to titrate her blood pressure regimen. She is doing much better currently after her last visit with Truitt Merle, NP.   No chest pain, no shortness of breath, no syncope, no bleeding      Wt Readings from Last 3 Encounters:  06/07/17 (!) 310 lb 6.4 oz (140.8 kg)  04/12/17 (!) 309 lb (140.2 kg)  03/01/17 (!) 304 lb 6.4 oz (138.1 kg)     Past Medical History:  Diagnosis Date  . Diabetes mellitus   . Fibroids   . Headache(784.0)   . History of echocardiogram    Echo 7/18: Moderate concentric LVH, EF 55-60, normal wall motion, grade 1 diastolic dysfunction, calcified aortic leaflets  . Hypercholesterolemia   . Hyperlipidemia   . Hypertension   . Osteoarthritis    , with mild meniscus tear, followed by orthopedics--Dr Gladstone Lighter 04/29/10  . Right knee pain   . Seizures ([place])    as child  . Vitamin D deficiency     Past Surgical History:  Procedure Laterality Date  . ANTERIOR CERVICAL DECOMP/DISCECTOMY FUSION N/A 09/07/2013   Procedure: ANTERIOR CERVICAL DECOMPRESSION/DISCECTOMY FUSION 1 LEVEL;  Surgeon: Sinclair Ship, MD;  Location: [facility];  Service: Orthopedics;  Laterality: N/A;  Anterior cervical decompression fusion, cervical 5-6 with instrumentation and allograft  . LAPAROSCOPIC ASSISTED VAGINAL HYSTERECTOMY  1/14  . SHOULDER SURGERY Right 11   rotator cuff  . TUBAL LIGATION    . UMBILICAL HERNIA REPAIR      Current Outpatient Prescriptions    Medication Sig Dispense Refill  . albuterol (PROVENTIL HFA;VENTOLIN HFA) 108 (90 Base) MCG/ACT inhaler Inhale 2 puffs into the lungs every 4 (four) hours as needed for wheezing or shortness of breath (cough, shortness of breath or wheezing.). 1 Inhaler 2  . amLODipine (NORVASC) 10 MG tablet Take 1 tablet (10 mg total) by mouth daily. 90 tablet 0  . benzonatate (TESSALON) 100 MG capsule Take 1-2 capsules (100-200 mg total) by mouth 3 (three) times daily as needed for cough. 40 capsule 0  . blood glucose meter kit and supplies KIT Dispense based on patient and insurance preference. Use up to four times daily as directed. (FOR ICD-9 250.00, 250.01). Check blood sugar every morning and at bedtime.  As needed if sick. 1 each 0  . carvedilol (COREG) 25 MG tablet Take 1.5 tablets (37.5 mg total) by mouth 2 (two) times daily with a meal. 90 tablet 6  . ferrous sulfate 325 (65 FE) MG tablet Take 325 mg by mouth 2 (two) times daily.    Marland Kitchen glipiZIDE (GLUCOTROL) 5 MG tablet Take 1 tablet (5 mg total) by mouth 2 (two) times daily before a meal. 180 tablet 0  . hydrochlorothiazide (HYDRODIURIL) 25 MG tablet Take 1 tablet (25 mg total) by mouth daily. 90 tablet 3  . linagliptin (TRADJENTA) 5 MG TABS tablet Take 1 tablet (5 mg total) by mouth daily. 30 tablet 0  . losartan (COZAAR) 100 MG tablet Take 1 tablet (100  mg total) by mouth daily. 90 tablet 0  . meloxicam (MOBIC) 7.5 MG tablet Take 1 tablet (7.5 mg total) by mouth daily. 30 tablet 1  . metFORMIN (GLUCOPHAGE) 1000 MG tablet Take 1 tablet (1,000 mg total) by mouth 2 (two) times daily with a meal. 180 tablet 0  . Omega-3 Fatty Acids (FISH OIL) 1000 MG CAPS Take 1,000 mg by mouth daily.    Marland Kitchen spironolactone (ALDACTONE) 25 MG tablet Take 25 mg by mouth daily.     . Turmeric 500 MG CAPS Take 500 mg by mouth 2 (two) times daily.    . Vitamin D, Ergocalciferol, (DRISDOL) 50000 units CAPS capsule Take 1 capsule (50,000 Units total) by mouth every 7 (seven) days. 30  capsule 0   No current facility-administered medications for this visit.     Allergies:    Allergies  Allergen Reactions  . Crestor [Rosuvastatin]     MUSCLE ACHES HEADACHES   . Hydrocodone Itching  . Other Hives, Swelling and Other (See Comments)    Vision changes; ALLERGIC TO ALL NUTS EXCEPT PEANUTS  . Oxycodone Itching  . Pravachol [Pravastatin Sodium] Itching    Joint pain  . Simvastatin Hives    Social History:  The patient  reports that she has never smoked. She has never used smokeless tobacco. She reports that she does not drink alcohol or use drugs.   ROS:  Please see the history of present illness.   No strokelike symptoms, no fevers, no chills, no chest pain, no shortness of breath    PHYSICAL EXAM: VS:  BP 126/86   Pulse 78   Ht 5' 8"  (1.727 m)   Wt (!) 310 lb 6.4 oz (140.8 kg)   LMP 05/09/2012   BMI 47.20 kg/m  GEN: Well nourished, well developed, in no acute distress obese HEENT: normal  Neck: no JVD, carotid bruits, or masses Cardiac: RRR; no murmurs, rubs, or gallops,no edema  Respiratory:  clear to auscultation bilaterally, normal work of breathing GI: soft, nontender, nondistended, + BS MS: no deformity or atrophy  Skin: warm and dry, no rash Neuro:  Alert and Oriented x 3, Strength and sensation are intact Psych: euthymic mood, full affect   EKG:  EKG ordered today 04/28/16-normal sinus rhythm 83 vertical axis borderline low voltage.     ECHO 03/04/15: - Left ventricle: The cavity size was normal. Systolic function was 66-29%   normal. Wall motion was normal; there were no regional wall   motion abnormalities. There was an increased relative   contribution of atrial contraction to ventricular filling.   Doppler parameters are consistent with abnormal left ventricular   relaxation (grade 1 diastolic dysfunction). - Atrial septum: No defect or patent foramen ovale was identified.  ECHO: 04/20/17  - Left ventricle: The cavity size was normal. There  was moderate   concentric hypertrophy. Systolic function was normal. The   estimated ejection fraction was in the range of 55% to 60%. Wall   motion was normal; there were no regional wall motion   abnormalities. There was an increased relative contribution of   atrial contraction to ventricular filling. Doppler parameters are   consistent with abnormal left ventricular relaxation (grade 1   diastolic dysfunction). - Aortic valve: Trileaflet; mildly calcified leaflets.   ASSESSMENT AND PLAN:  1. Cardiomyopathy-most recent echocardiogram from 2016 and 2018 after effective medical therapy showed normalization of her left ventricular ejection fraction 55%. Excellent. Result with good blood pressure 2. Hypertension/hypertensive heart disease with heart  failure-doing very well on her current medication regimen. No changes.  3. Morbid obesity-continue to encourage weight loss. This can significantly help with both her diabetes as well as hypertension.  Hopefully she will be able to be active enough to continue to lose weight. Continue to advocate 4. Diabetes-this will improve with weight loss. Keep working on this 5. Statin intolerance- has tried in the past Crestor 10 mg once weekly. She has had prior myalgias/joint discomfort on once daily Crestor, pravastatin as well.  6 months with Cecille Rubin and 12 months with me  Signed, Candee Furbish, MD [facility]  06/07/2017 3:26 PM

## 2017-06-25 ENCOUNTER — Other Ambulatory Visit: Payer: Self-pay | Admitting: Family Medicine

## 2017-06-27 ENCOUNTER — Other Ambulatory Visit: Payer: Self-pay | Admitting: Cardiology

## 2017-06-28 ENCOUNTER — Other Ambulatory Visit: Payer: Self-pay

## 2017-06-28 MED ORDER — SPIRONOLACTONE 25 MG PO TABS: 25.0000 mg | ORAL_TABLET | Freq: Every day | ORAL | 1 refills | Status: DC

## 2017-06-28 MED ORDER — LINAGLIPTIN 5 MG PO TABS: 5.0000 mg | ORAL_TABLET | Freq: Every day | ORAL | 0 refills | Status: DC

## 2017-06-28 MED ORDER — AMLODIPINE BESYLATE 10 MG PO TABS: 10.0000 mg | ORAL_TABLET | Freq: Every day | ORAL | 0 refills | Status: DC

## 2017-07-02 ENCOUNTER — Ambulatory Visit: Payer: Managed Care, Other (non HMO) | Admitting: Family Medicine

## 2017-07-07 ENCOUNTER — Encounter: Payer: Self-pay | Admitting: Family Medicine

## 2017-07-07 ENCOUNTER — Ambulatory Visit (INDEPENDENT_AMBULATORY_CARE_PROVIDER_SITE_OTHER): Payer: Managed Care, Other (non HMO) | Admitting: Family Medicine

## 2017-07-07 VITALS — BP 144/90 | HR 84 | Temp 98.6°F | Resp 14 | Ht 68.0 in | Wt 308.0 lb

## 2017-07-07 DIAGNOSIS — E1159 Type 2 diabetes mellitus with other circulatory complications: Secondary | ICD-10-CM | POA: Diagnosis not present

## 2017-07-07 DIAGNOSIS — I10 Essential (primary) hypertension: Secondary | ICD-10-CM

## 2017-07-07 DIAGNOSIS — G8929 Other chronic pain: Secondary | ICD-10-CM

## 2017-07-07 DIAGNOSIS — M25562 Pain in left knee: Secondary | ICD-10-CM | POA: Diagnosis not present

## 2017-07-07 LAB — CBC
HCT: 38.6 % (ref 35.0–45.0)
HEMOGLOBIN: 11.9 g/dL (ref 11.7–15.5)
MCH: 21.9 pg — ABNORMAL LOW (ref 27.0–33.0)
MCHC: 30.8 g/dL — AB (ref 32.0–36.0)
MCV: 71.1 fL — ABNORMAL LOW (ref 80.0–100.0)
MPV: 9.5 fL (ref 7.5–12.5)
Platelets: 378 10*3/uL (ref 140–400)
RBC: 5.43 10*6/uL — ABNORMAL HIGH (ref 3.80–5.10)
RDW: 15.1 % — AB (ref 11.0–15.0)
WBC: 6.9 10*3/uL (ref 3.8–10.8)

## 2017-07-07 LAB — COMPLETE METABOLIC PANEL WITH GFR
AG Ratio: 1.2 (calc) (ref 1.0–2.5)
ALBUMIN MSPROF: 4.2 g/dL (ref 3.6–5.1)
ALKALINE PHOSPHATASE (APISO): 42 U/L (ref 33–115)
ALT: 11 U/L (ref 6–29)
AST: 12 U/L (ref 10–35)
BUN: 12 mg/dL (ref 7–25)
CO2: 31 mmol/L (ref 20–32)
CREATININE: 0.74 mg/dL (ref 0.50–1.10)
Calcium: 9.7 mg/dL (ref 8.6–10.2)
Chloride: 102 mmol/L (ref 98–110)
GFR, Est African American: 110 mL/min/{1.73_m2} (ref >=60)
GFR, Est Non African American: 95 mL/min/{1.73_m2} (ref >=60)
GLUCOSE: 83 mg/dL (ref 65–99)
Globulin: 3.6 g/dL (calc) (ref 1.9–3.7)
Potassium: 4 mmol/L (ref 3.5–5.3)
Sodium: 141 mmol/L (ref 135–146)
Total Bilirubin: 0.5 mg/dL (ref 0.2–1.2)
Total Protein: 7.8 g/dL (ref 6.1–8.1)

## 2017-07-07 LAB — POCT GLYCOSYLATED HEMOGLOBIN (HGB A1C): HEMOGLOBIN A1C: 7.2

## 2017-07-07 MED ORDER — LOSARTAN POTASSIUM 100 MG PO TABS: 100.0000 mg | ORAL_TABLET | Freq: Every day | ORAL | 2 refills | Status: DC

## 2017-07-07 MED ORDER — PREDNISONE 20 MG PO TABS: 40.0000 mg | ORAL_TABLET | Freq: Every day | ORAL | 0 refills | Status: DC

## 2017-07-07 MED ORDER — AMLODIPINE BESYLATE 10 MG PO TABS: 10.0000 mg | ORAL_TABLET | Freq: Every day | ORAL | 1 refills | Status: DC

## 2017-07-07 MED ORDER — HYDROCHLOROTHIAZIDE 25 MG PO TABS
25.0000 mg | ORAL_TABLET | Freq: Every day | ORAL | 3 refills | Status: DC
Start: 2017-07-07 — End: 2018-04-26

## 2017-07-07 MED ORDER — METFORMIN HCL 1000 MG PO TABS: 1000.0000 mg | ORAL_TABLET | Freq: Two times a day (BID) | ORAL | 2 refills | Status: DC

## 2017-07-07 MED ORDER — LINAGLIPTIN 5 MG PO TABS: 5.0000 mg | ORAL_TABLET | Freq: Every day | ORAL | 2 refills | Status: DC

## 2017-07-07 MED ORDER — GLIPIZIDE 5 MG PO TABS: ORAL_TABLET | ORAL | 2 refills | Status: DC

## 2017-07-07 MED ORDER — SPIRONOLACTONE 25 MG PO TABS: 25.0000 mg | ORAL_TABLET | Freq: Every day | ORAL | 2 refills | Status: DC

## 2017-07-07 NOTE — Patient Instructions (Addendum)
For knee pain, you are being referred to orthopedics and I'm placing you on prednisone 40 mg with breakfast for 5 days to reduce inflammation.   Great job at managing your diabetes!    Knee Pain, Adult Many things can cause knee pain. The pain often goes away on its own with time and rest. If the pain does not go away, tests may be done to find out what is causing the pain. Follow these instructions at home: Activity  Rest your knee.  Do not do things that cause pain.  Avoid activities where both feet leave the ground at the same time (high-impact activities). Examples are running, jumping rope, and doing jumping jacks. General instructions  Take medicines only as told by your doctor.  Raise (elevate) your knee when you are resting. Make sure your knee is higher than your heart.  Sleep with a pillow under your knee.  If told, put ice on the knee: ? Put ice in a plastic bag. ? Place a towel between your skin and the bag. ? Leave the ice on for 20 minutes, 2-3 times a day.  Ask your doctor if you should wear an elastic knee support.  Lose weight if you are overweight. Being overweight can make your knee hurt more.  Do not use any tobacco products. These include cigarettes, chewing tobacco, or electronic cigarettes. If you need help quitting, ask your doctor. Smoking may slow down healing. Contact a doctor if:  The pain does not stop.  The pain changes or gets worse.  You have a fever along with knee pain.  Your knee gives out or locks up.  Your knee swells, and becomes worse. Get help right away if:  Your knee feels warm.  You cannot move your knee.  You have very bad knee pain.  You have chest pain.  You have trouble breathing. Summary  Many things can cause knee pain. The pain often goes away on its own with time and rest.  Avoid activities that put stress on your knee. These include running and jumping rope.  Get help right away if you cannot move your  knee, or if your knee feels warm, or if you have trouble breathing. This information is not intended to replace advice given to you by your health care provider. Make sure you discuss any questions you have with your health care provider. Document Released: 12/11/2008 Document Revised: 09/08/2016 Document Reviewed: 09/08/2016 Elsevier Interactive Patient Education  2017 [employer].

## 2017-07-07 NOTE — Progress Notes (Signed)
Patient ID: Claudia [name], female    DOB: 08-01-1966, 51 y.o.   MRN: [id number]  PCP: Scot Jun, FNP  Chief Complaint  Patient presents with  . Follow-up    Subjective:  HPI Claudia [name] is a 51 y.o. female presents for 3 month follow-up of diabetes and hypertension. She was last seen in office for uncontrolled diabetes and accelerated hypertension management on 03/01/2017. Claudia [name] also suffers from CHF and is followed by Dr. Candee Furbish, cardiologist. Her last cardiology visit was 06/07/2017 which she received a stable bilirubin health regarding her CHF and was advised to follow up with his office in 6 months. She has grade 1 diastolic failure with preserved EF. Claudia [name] suffers from morbid obesity she reports today no recent physical activity although notes major changes to her dietary intake. She has eliminated drinking sodas and eating foods high in fat.  She reports occasionally monitoring her blood sugar but not consistently. She admits to adherence with taking both are hypertensive and diabetes medications. She does not check her blood pressure at home. Last A1c was 9.2. Her current Body mass index is 46.83 kg/m. She denies any chest pain, shortness of breath, dizziness, headaches, or chronic cough. Claudia [name] does complain today of bilateral knee pain. She had imaging of her left knee performed back in April which were significant for mild to moderate osteoarthritis within the left knee. Imaging for right knee occurred back in June 2016 which also showed mild degenerative changes. She is requesting orthopedic referral today.  Social History   Social History  . Marital status: Married    Spouse name: N/A  . Number of children: N/A  . Years of education: N/A   Occupational History  . Not on file.   Social History Main Topics  . Smoking status: Never Smoker  . Smokeless tobacco: Never Used  . Alcohol use No  . Drug use: No  . Sexual activity: Not on file   Other Topics  Concern  . Not on file   Social History Narrative  . No narrative on file    Family History  Problem Relation Age of Onset  . Hypertension Mother   . Diabetes Mother   . Hypercholesterolemia Mother   . Hypertension Sister   . CVA Maternal Grandmother   . Hypertension Maternal Grandmother   . Diabetes Maternal Grandmother   . Heart attack Neg Hx    Review of Systems See history of present illness Patient Active Problem List   Diagnosis Date Noted  . Chest pain   . Benign essential HTN   . Cardiomyopathy ([place]) 03/16/2014  . Radiculopathy 09/07/2013  . Hypokalemia 06/02/2012  . HTN (hypertension) 06/01/2012  . DM (diabetes mellitus) ([place]) 06/01/2012  . Hyperlipidemia 06/01/2012  . Dyspnea on exertion 06/01/2012  . Obesity 06/01/2012    Allergies  Allergen Reactions  . Crestor [Rosuvastatin]     MUSCLE ACHES HEADACHES   . Hydrocodone Itching  . Other Hives, Swelling and Other (See Comments)    Vision changes; ALLERGIC TO ALL NUTS EXCEPT PEANUTS  . Oxycodone Itching  . Pravachol [Pravastatin Sodium] Itching    Joint pain  . Simvastatin Hives    Prior to Admission medications   Medication Sig Start Date End Date Taking? Authorizing Provider  albuterol (PROVENTIL HFA;VENTOLIN HFA) 108 (90 Base) MCG/ACT inhaler Inhale 2 puffs into the lungs every 4 (four) hours as needed for wheezing or shortness of breath (cough, shortness of breath or wheezing.). 02/25/17   Molli Barrows  S, FNP  amLODipine (NORVASC) 10 MG tablet TAKE ONE TABLET BY MOUTH ONCE DAILY 06/29/17   Jerline Pain, MD  amLODipine (NORVASC) 10 MG tablet Take 1 tablet (10 mg total) by mouth daily. 06/28/17   Scot Jun, FNP  benzonatate (TESSALON) 100 MG capsule Take 1-2 capsules (100-200 mg total) by mouth 3 (three) times daily as needed for cough. 03/01/17   Scot Jun, FNP  blood glucose meter kit and supplies KIT Dispense based on patient and insurance preference. Use up to four times daily as  directed. (FOR ICD-9 250.00, 250.01). Check blood sugar every morning and at bedtime.  As needed if sick. 03/01/17   Scot Jun, FNP  carvedilol (COREG) 25 MG tablet Take 1.5 tablets (37.5 mg total) by mouth 2 (two) times daily with a meal. 04/12/17   Burtis Junes, NP  ferrous sulfate 325 (65 FE) MG tablet Take 325 mg by mouth 2 (two) times daily.    [provider]  glipiZIDE (GLUCOTROL) 5 MG tablet TAKE 1 TABLET BY MOUTH TWICE DAILY BEFORE  A  MEAL 06/28/17   Scot Jun, FNP  hydrochlorothiazide (HYDRODIURIL) 25 MG tablet Take 1 tablet (25 mg total) by mouth daily. 03/03/17   Scot Jun, FNP  linagliptin (TRADJENTA) 5 MG TABS tablet Take 1 tablet (5 mg total) by mouth daily. 06/28/17   Scot Jun, FNP  losartan (COZAAR) 100 MG tablet Take 1 tablet (100 mg total) by mouth daily. 03/04/17   Scot Jun, FNP  meloxicam (MOBIC) 7.5 MG tablet Take 1 tablet (7.5 mg total) by mouth daily. 03/03/17   Scot Jun, FNP  metFORMIN (GLUCOPHAGE) 1000 MG tablet Take 1 tablet (1,000 mg total) by mouth 2 (two) times daily with a meal. 04/13/17   Scot Jun, FNP  Omega-3 Fatty Acids (FISH OIL) 1000 MG CAPS Take 1,000 mg by mouth daily.    [provider]  spironolactone (ALDACTONE) 25 MG tablet Take 1 tablet (25 mg total) by mouth daily. 06/28/17   Scot Jun, FNP  Turmeric 500 MG CAPS Take 500 mg by mouth 2 (two) times daily.    [provider]  Vitamin D, Ergocalciferol, (DRISDOL) 50000 units CAPS capsule Take 1 capsule (50,000 Units total) by mouth every 7 (seven) days. 03/01/17   Scot Jun, FNP    Past Medical, Surgical Family and Social History reviewed and updated.    Objective:   Vitals:   07/07/17 1554  BP: (!) 144/90  Pulse: 84  Resp: 14  Temp: 98.6 F (37 C)  SpO2: 99%     Wt Readings from Last 3 Encounters:  06/07/17 (!) 310 lb 6.4 oz (140.8 kg)  04/12/17 (!) 309 lb (140.2 kg)  03/01/17 (!) 304 lb  6.4 oz (138.1 kg)   Physical Exam  Constitutional: She is oriented to person, place, and time. She appears well-developed and well-nourished.  HENT:  Head: Normocephalic and atraumatic.  Eyes: Pupils are equal, round, and reactive to light. Conjunctivae and EOM are normal.  Neck: Neck supple.  Cardiovascular: Normal rate, regular rhythm, normal heart sounds and intact distal pulses.   Pulmonary/Chest: Effort normal and breath sounds normal.  Musculoskeletal: Normal range of motion.  Neurological: She is alert and oriented to person, place, and time.  Skin: Skin is warm and dry.  Psychiatric: She has a normal mood and affect. Her behavior is normal. Judgment and thought content normal.   Assessment & Plan:  1. Essential hypertension, uncontrolled and elevated today. Blood pressure was recently stable at cardiology visit. Will continue on current medication regimen for now. 2. Type 2 diabetes mellitus with other circulatory complication, unspecified whether long term insulin use ([place]), controlled. A1c is 7.2. Continue current antidiabetic regimen. Increase physical activity to recommend 150 minutes per week. Provide handout on an 1800 consistent carb diabetic diet.  3. Bilateral knee pain, will trial a short course of prednisone 40 mg daily 5 days. Ambulatory referral to orthopedic. 4. Morbid obesity-recommended an 1800 consistent carb diet and engage in physical activity 150 minutes per week.  Return for care in 3 months for chronic disease management     Carroll Sage. Kenton Kingfisher, MSN, FNP-C [facility]  [address] [place], [place] [address] [phone]

## 2017-07-08 LAB — POCT URINALYSIS DIP (DEVICE)
Bilirubin Urine: NEGATIVE
GLUCOSE, UA: NEGATIVE mg/dL
Hgb urine dipstick: NEGATIVE
Ketones, ur: NEGATIVE mg/dL
Leukocytes, UA: NEGATIVE
NITRITE: NEGATIVE
PROTEIN: NEGATIVE mg/dL
SPECIFIC GRAVITY, URINE: 1.02 (ref 1.005–1.030)
UROBILINOGEN UA: 1 mg/dL (ref 0.0–1.0)
pH: 6.5 (ref 5.0–8.0)

## 2017-07-09 ENCOUNTER — Telehealth: Payer: Self-pay | Admitting: Cardiology

## 2017-07-09 NOTE — Telephone Encounter (Signed)
Spoke with patient who is reporting she has never taken HCTZ as Rxed by Dr Kenton Kingfisher.   She reports she didn't even know she was supposed to be taking and isn't going to start it since her BP is OK.  She is going to continue to try to lose wt.  She has only been taking Amlodipine 10 mg a day, carvedilol 25 mg (taking TID), losartan 100 mg a day and Spironolactone 25 mg a day. She will monitor her BP and let us know if she has any problems/concerns.

## 2017-07-09 NOTE — Telephone Encounter (Signed)
°  New Prob  Pt has some questions regarding Hydrochlorothiazide medication. Please call.

## 2017-07-14 ENCOUNTER — Telehealth (INDEPENDENT_AMBULATORY_CARE_PROVIDER_SITE_OTHER): Payer: Self-pay | Admitting: Ophthalmology

## 2017-07-14 NOTE — Telephone Encounter (Signed)
Called to schedule appt and left message to call back [phone]

## 2017-07-15 ENCOUNTER — Ambulatory Visit (INDEPENDENT_AMBULATORY_CARE_PROVIDER_SITE_OTHER): Payer: Managed Care, Other (non HMO) | Admitting: Orthopaedic Surgery

## 2017-07-15 ENCOUNTER — Telehealth: Payer: Self-pay

## 2017-07-15 DIAGNOSIS — M25562 Pain in left knee: Secondary | ICD-10-CM

## 2017-07-15 DIAGNOSIS — M1712 Unilateral primary osteoarthritis, left knee: Secondary | ICD-10-CM | POA: Diagnosis not present

## 2017-07-15 DIAGNOSIS — G8929 Other chronic pain: Secondary | ICD-10-CM

## 2017-07-15 DIAGNOSIS — M1711 Unilateral primary osteoarthritis, right knee: Secondary | ICD-10-CM | POA: Diagnosis not present

## 2017-07-15 DIAGNOSIS — M25561 Pain in right knee: Secondary | ICD-10-CM | POA: Diagnosis not present

## 2017-07-15 MED ORDER — METHYLPREDNISOLONE ACETATE 40 MG/ML IJ SUSP
40.0000 mg | INTRAMUSCULAR | Status: AC | PRN
  Administered 2017-07-15: 40 mg via INTRA_ARTICULAR

## 2017-07-15 MED ORDER — LIDOCAINE HCL 1 % IJ SOLN
3.0000 mL | INTRAMUSCULAR | Status: AC | PRN
  Administered 2017-07-15: 3 mL

## 2017-07-15 NOTE — Progress Notes (Signed)
Office Visit Note   Patient: Claudia [name]           Date of Birth: [birth year]           MRN: [id number] Visit Date: 07/15/2017              Requested by: Scot Jun, [place], [place] [address] PCP: Scot Jun, FNP   Assessment & Plan: Visit Diagnoses:  1. Chronic pain of left knee   2. Chronic pain of right knee   3. Unilateral primary osteoarthritis, left knee   4. Unilateral primary osteoarthritis, right knee     Plan: We spoke about trying an injection just in her left knee today of a steroid. I do feel that she is a candidate for hyaluronic acid in both knees. I stressed the importance of weight loss and will try a knee brace for her left knee which will be a knee sleeve. Also talked about quad strengthening exercises. We'll see her back in 4 weeks to hopefully place hyaluronic acid in both knees.  Follow-Up Instructions: Return in about 4 weeks (around 08/12/2017).   Orders:  No orders of the defined types were placed in this encounter.  No orders of the defined types were placed in this encounter.     Procedures: Large Joint Inj Date/Time: 07/15/2017 2:27 PM Performed by: Mcarthur Rossetti Authorized by: Mcarthur Rossetti   Location:  Knee Site:  L knee Ultrasound Guidance: No   Fluoroscopic Guidance: No   Arthrogram: No   Medications:  3 mL lidocaine 1 %; 40 mg methylPREDNISolone acetate 40 MG/ML     Clinical Data: No additional findings.   Subjective: No chief complaint on file. Patient is someone I'm seeing for the first time. She is a 51 year old pleasant female with diabetes who reports good control but significant pain in both her knees over long period time. Her left is once been hurting the most recently. There are x-rays on canopy system for me to review of both her knees. The left knee has been hurting her the most recently. She is morbidly obese. She denies any injury to his knees never had surgery. She  denies any locking catching and mainly as aching pain and swelling when she's been on her knees all day long.  HPI  Review of Systems He currently denies any headache, chest pain, shortness of breath, fever, chills, nausea, vomiting.  Objective: Vital Signs: LMP 05/09/2012   Physical Exam He is alert or 3 and in no acute distress Ortho Exam Both knees have no effusion today. Both knees have global tenderness with some patellofemoral crepitation and slight varus malalignment. Both knees have full range of motion and are ligamentously stable. Specialty Comments:  No specialty comments available.  Imaging: No results found. Independent review of x-rays of her knees show mild to moderate arthritic changes and otherwise no acute findings. The joint space is well-maintained still but there is significant para-articular osteophytes in all 3 compartments of left knee on the right knee.  PMFS History: Patient Active Problem List   Diagnosis Date Noted  . Chronic pain of left knee 07/15/2017  . Chronic pain of right knee 07/15/2017  . Unilateral primary osteoarthritis, left knee 07/15/2017  . Unilateral primary osteoarthritis, right knee 07/15/2017  . Chest pain   . Benign essential HTN   . Cardiomyopathy ([place]) 03/16/2014  . Radiculopathy 09/07/2013  . Hypokalemia 06/02/2012  . HTN (hypertension) 06/01/2012  . DM (diabetes  mellitus) ([place]) 06/01/2012  . Hyperlipidemia 06/01/2012  . Dyspnea on exertion 06/01/2012  . Obesity 06/01/2012   Past Medical History:  Diagnosis Date  . Diabetes mellitus   . Fibroids   . Headache(784.0)   . History of echocardiogram    Echo 7/18: Moderate concentric LVH, EF 55-60, normal wall motion, grade 1 diastolic dysfunction, calcified aortic leaflets  . Hypercholesterolemia   . Hyperlipidemia   . Hypertension   . Osteoarthritis    , with mild meniscus tear, followed by orthopedics--Dr Gladstone Lighter 04/29/10  . Right knee pain   . Seizures ([place])    as  child  . Vitamin D deficiency     Family History  Problem Relation Age of Onset  . Hypertension Mother   . Diabetes Mother   . Hypercholesterolemia Mother   . Hypertension Sister   . CVA Maternal Grandmother   . Hypertension Maternal Grandmother   . Diabetes Maternal Grandmother   . Heart attack Neg Hx     Past Surgical History:  Procedure Laterality Date  . ANTERIOR CERVICAL DECOMP/DISCECTOMY FUSION N/A 09/07/2013   Procedure: ANTERIOR CERVICAL DECOMPRESSION/DISCECTOMY FUSION 1 LEVEL;  Surgeon: Sinclair Ship, MD;  Location: [facility];  Service: Orthopedics;  Laterality: N/A;  Anterior cervical decompression fusion, cervical 5-6 with instrumentation and allograft  . LAPAROSCOPIC ASSISTED VAGINAL HYSTERECTOMY  1/14  . SHOULDER SURGERY Right 11   rotator cuff  . TUBAL LIGATION    . UMBILICAL HERNIA REPAIR     Social History   Occupational History  . Not on file.   Social History Main Topics  . Smoking status: Never Smoker  . Smokeless tobacco: Never Used  . Alcohol use No  . Drug use: No  . Sexual activity: Not on file

## 2017-08-16 ENCOUNTER — Encounter (INDEPENDENT_AMBULATORY_CARE_PROVIDER_SITE_OTHER): Payer: Self-pay | Admitting: Orthopaedic Surgery

## 2017-08-16 ENCOUNTER — Ambulatory Visit (INDEPENDENT_AMBULATORY_CARE_PROVIDER_SITE_OTHER): Payer: Managed Care, Other (non HMO) | Admitting: Orthopaedic Surgery

## 2017-08-16 DIAGNOSIS — M1711 Unilateral primary osteoarthritis, right knee: Secondary | ICD-10-CM | POA: Diagnosis not present

## 2017-08-16 DIAGNOSIS — M1712 Unilateral primary osteoarthritis, left knee: Secondary | ICD-10-CM | POA: Diagnosis not present

## 2017-08-16 MED ORDER — HYALURONAN 88 MG/4ML IX SOSY
88.0000 mg | PREFILLED_SYRINGE | INTRA_ARTICULAR | Status: AC | PRN
  Administered 2017-08-16: 88 mg via INTRA_ARTICULAR

## 2017-08-16 NOTE — Progress Notes (Signed)
   Procedure Note  Patient: Claudia [name]             Date of Birth: 07/31/1966           MRN: [id number]             Visit Date: 08/16/2017  Procedures: Visit Diagnoses: Unilateral primary osteoarthritis, left knee  Unilateral primary osteoarthritis, right knee  Large Joint Inj: R knee on 08/16/2017 2:51 PM Indications: pain and diagnostic evaluation Details: 22 G 1.5 in needle, superolateral approach  Arthrogram: No  Medications: 88 mg Hyaluronan 88 MG/4ML Outcome: tolerated well, no immediate complications Procedure, treatment alternatives, risks and benefits explained, specific risks discussed. Consent was given by the patient. Immediately prior to procedure a time out was called to verify the correct patient, procedure, equipment, support staff and site/side marked as required. Patient was prepped and draped in the usual sterile fashion.   Large Joint Inj on 08/16/2017 2:51 PM Indications: pain and diagnostic evaluation Details: 22 G 1.5 in needle, superolateral approach  Arthrogram: No  Medications: 88 mg Hyaluronan 88 MG/4ML Outcome: tolerated well, no immediate complications Procedure, treatment alternatives, risks and benefits explained, specific risks discussed. Consent was given by the patient. Immediately prior to procedure a time out was called to verify the correct patient, procedure, equipment, support staff and site/side marked as required. Patient was prepped and draped in the usual sterile fashion.     The patient is here today for bilateral knee Monovisc injections to treat her knee pain and moderate arthritis.  She is an obese 51 year old female and is already had steroid injections in both her knees.  We are trying hyaluronic acid to see if this will help decrease her pain so she can improve mobility and improve her quality of life as well as lose weight.  She understands fully the reasoning behind these injections as well as the risk and benefits involved.  On  exam He has an effusion but a painful range of motion.  We were able to Place Monovisc injections in both her knees without much difficulty.  It was much more painful in the right knee so I placed it in the superior lateral aspect of the left knee and this is better for her for injections in the future.  She understands that she will follow-up as needed however we can provide steroid injections in her knees again in 3 months if needed.  All questions and concerns were answered and addressed.

## 2017-08-31 ENCOUNTER — Telehealth: Payer: Self-pay

## 2017-08-31 ENCOUNTER — Telehealth (INDEPENDENT_AMBULATORY_CARE_PROVIDER_SITE_OTHER): Payer: Self-pay | Admitting: Orthopaedic Surgery

## 2017-08-31 NOTE — Telephone Encounter (Signed)
Advise patient that De. Ninfa Linden is managing her chronic knee condition and she should direct her request to his office.   Carroll Sage. Kenton Kingfisher, MSN, FNP-C [facility]  [address] [place], [place] [address] [phone]

## 2017-08-31 NOTE — Telephone Encounter (Signed)
Patient needs a note for work stating that she is unable to be on her knees due to her bad knees.  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

## 2017-08-31 NOTE — Telephone Encounter (Signed)
Please advise 

## 2017-08-31 NOTE — Telephone Encounter (Signed)
Yes; please send a note stating that and that it is medically warranted due to severe knee arthritis

## 2017-08-31 NOTE — Telephone Encounter (Signed)
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  Patient said her supervisor's name is Lemmie Evens.  [place]. The ph# is [phone].  The number to contact patient is [phone]

## 2017-09-01 NOTE — Telephone Encounter (Signed)
Left a vm for patient to callback 

## 2017-09-01 NOTE — Telephone Encounter (Signed)
Patient notified

## 2017-09-02 ENCOUNTER — Other Ambulatory Visit: Payer: Self-pay | Admitting: Family Medicine

## 2017-09-02 ENCOUNTER — Encounter (INDEPENDENT_AMBULATORY_CARE_PROVIDER_SITE_OTHER): Payer: Self-pay

## 2017-09-02 NOTE — Telephone Encounter (Signed)
LMOM for patient that her note is ready at the front desk

## 2017-10-08 ENCOUNTER — Ambulatory Visit: Payer: Managed Care, Other (non HMO) | Admitting: Family Medicine

## 2017-10-13 ENCOUNTER — Ambulatory Visit (HOSPITAL_COMMUNITY)
Admission: RE | Admit: 2017-10-13 | Discharge: 2017-10-13 | Disposition: A | Discharge status: Home / Self Care | Payer: Managed Care, Other (non HMO) | Source: Ambulatory Visit | Attending: Family Medicine | Admitting: Family Medicine

## 2017-10-13 ENCOUNTER — Ambulatory Visit (INDEPENDENT_AMBULATORY_CARE_PROVIDER_SITE_OTHER): Payer: Managed Care, Other (non HMO) | Admitting: Family Medicine

## 2017-10-13 ENCOUNTER — Encounter: Payer: Self-pay | Admitting: Family Medicine

## 2017-10-13 VITALS — BP 154/88 | HR 76 | Temp 98.1°F | Resp 16 | Ht 68.0 in | Wt 313.0 lb

## 2017-10-13 DIAGNOSIS — E1159 Type 2 diabetes mellitus with other circulatory complications: Secondary | ICD-10-CM

## 2017-10-13 DIAGNOSIS — I10 Essential (primary) hypertension: Secondary | ICD-10-CM

## 2017-10-13 DIAGNOSIS — M25511 Pain in right shoulder: Secondary | ICD-10-CM | POA: Diagnosis not present

## 2017-10-13 LAB — POCT URINALYSIS DIP (DEVICE)
Bilirubin Urine: NEGATIVE
Glucose, UA: NEGATIVE mg/dL
HGB URINE DIPSTICK: NEGATIVE
KETONES UR: NEGATIVE mg/dL
Leukocytes, UA: NEGATIVE
Nitrite: NEGATIVE
PROTEIN: NEGATIVE mg/dL
SPECIFIC GRAVITY, URINE: 1.02 (ref 1.005–1.030)
UROBILINOGEN UA: 0.2 mg/dL (ref 0.0–1.0)
pH: 5 (ref 5.0–8.0)

## 2017-10-13 LAB — POCT GLYCOSYLATED HEMOGLOBIN (HGB A1C): Hemoglobin A1C: 7.7

## 2017-10-13 MED ORDER — NAPROXEN 500 MG PO TABS: 500.0000 mg | ORAL_TABLET | Freq: Two times a day (BID) | ORAL | 0 refills | Status: DC

## 2017-10-13 MED ORDER — CYCLOBENZAPRINE HCL 10 MG PO TABS: 10.0000 mg | ORAL_TABLET | Freq: Three times a day (TID) | ORAL | 0 refills | Status: DC | PRN

## 2017-10-13 MED ORDER — SITAGLIPTIN PHOSPHATE 25 MG PO TABS: 25.0000 mg | ORAL_TABLET | Freq: Every day | ORAL | 1 refills | Status: DC

## 2017-10-13 NOTE — Progress Notes (Signed)
Patient ID: Claudia [name], female    DOB: [birth year], 52 y.o.   MRN: [id number]  PCP: Scot Jun, FNP  Chief Complaint  Patient presents with  . Follow-up    3 month on chronic condition  . Shoulder Pain    right    Subjective:  HPI Claudia [name] is a 52 y.o. female with diabetes, hypertension, heart failure, presents today for a 3 month follow-up of chronic conditions.   Diabetes/Hypertension [facility] monitors her blood sugar at home and reports latest fasting morning readings less than 200. She reports adheres to current medication regimen. Denies neuropathic pain. Denies any recent hypoglycemia, polyuria, polydipsia or polyphagia. Last A1C 7.2. She has gained 5lbs within the last 3 months. Current Body mass index is 47.59 kg/m.  She continues to engage in no routine exercise and doesn't adhere to a diabetes diet. Claudia [name] suffers from hypertension which has remained difficult to control. She is not consistently adherent to a low sodium diet. No consistent home monitoring of blood pressure therefore uncertain of readings. Suffers from heart failure and doesn't consistently monitor weights daily at home. Reports lower extremity swelling daily which improves with leg elevation.   Right Shoulder Pain  Right shoulder pain. Onset of pain 1 month ago. History of rotator cuff injury, although denies any recent . Pain is exacerbated by lying on her right side. She has attempted relief with ibuprofen , heat, and ice pack. Reports associated right hand weakness and difficulty gripping objects. She is established with [facility] for left knee pain and requests a referral to speciality for shoulder pain. Social History   Socioeconomic History  . Marital status: Married    Spouse name: Not on file  . Number of children: Not on file  . Years of education: Not on file  . Highest education level: Not on file  Social Needs  . Financial resource strain: Not on file  . Food insecurity -  worry: Not on file  . Food insecurity - inability: Not on file  . Transportation needs - medical: Not on file  . Transportation needs - non-medical: Not on file  Occupational History  . Not on file  Tobacco Use  . Smoking status: Never Smoker  . Smokeless tobacco: Never Used  Substance and Sexual Activity  . Alcohol use: No  . Drug use: No  . Sexual activity: Not on file  Other Topics Concern  . Not on file  Social History Narrative  . Not on file    Family History  Problem Relation Age of Onset  . Hypertension Mother   . Diabetes Mother   . Hypercholesterolemia Mother   . Hypertension Sister   . CVA Maternal Grandmother   . Hypertension Maternal Grandmother   . Diabetes Maternal Grandmother   . Heart attack Neg Hx    Review of Systems  Constitutional: Negative.   Respiratory: Negative.   Cardiovascular: Negative.   Musculoskeletal: Positive for arthralgias, back pain and myalgias.  Skin: Negative.   Psychiatric/Behavioral: Negative.     Patient Active Problem List   Diagnosis Date Noted  . Chronic pain of left knee 07/15/2017  . Chronic pain of right knee 07/15/2017  . Unilateral primary osteoarthritis, left knee 07/15/2017  . Unilateral primary osteoarthritis, right knee 07/15/2017  . Chest pain   . Benign essential HTN   . Cardiomyopathy ([place]) 03/16/2014  . Radiculopathy 09/07/2013  . Hypokalemia 06/02/2012  . HTN (hypertension) 06/01/2012  . DM (diabetes mellitus) ([place]) 06/01/2012  .  Hyperlipidemia 06/01/2012  . Dyspnea on exertion 06/01/2012  . Obesity 06/01/2012    Allergies  Allergen Reactions  . Crestor [Rosuvastatin]     MUSCLE ACHES HEADACHES   . Hydrocodone Itching  . Other Hives, Swelling and Other (See Comments)    Vision changes; ALLERGIC TO ALL NUTS EXCEPT PEANUTS  . Oxycodone Itching  . Pravachol [Pravastatin Sodium] Itching    Joint pain  . Simvastatin Hives    Prior to Admission medications   Medication Sig Start Date End Date  Taking? Authorizing Provider  albuterol (PROVENTIL HFA;VENTOLIN HFA) 108 (90 Base) MCG/ACT inhaler Inhale 2 puffs into the lungs every 4 (four) hours as needed for wheezing or shortness of breath (cough, shortness of breath or wheezing.). 02/25/17  Yes Scot Jun, FNP  amLODipine (NORVASC) 10 MG tablet TAKE ONE TABLET BY MOUTH ONCE DAILY 06/29/17  Yes Jerline Pain, MD  amLODipine (NORVASC) 10 MG tablet Take 1 tablet (10 mg total) by mouth daily. 07/07/17  Yes Scot Jun, FNP  blood glucose meter kit and supplies KIT Dispense based on patient and insurance preference. Use up to four times daily as directed. (FOR ICD-9 250.00, 250.01). Check blood sugar every morning and at bedtime.  As needed if sick. 03/01/17  Yes Scot Jun, FNP  carvedilol (COREG) 25 MG tablet Take 1.5 tablets (37.5 mg total) by mouth 2 (two) times daily with a meal. 04/12/17  Yes Burtis Junes, NP  ferrous sulfate 325 (65 FE) MG tablet Take 325 mg by mouth 2 (two) times daily.   Yes [provider]  glipiZIDE (GLUCOTROL) 5 MG tablet TAKE 1 TABLET BY MOUTH TWICE DAILY BEFORE  A  MEAL 07/07/17  Yes Scot Jun, FNP  hydrochlorothiazide (HYDRODIURIL) 25 MG tablet Take 1 tablet (25 mg total) by mouth daily. 07/07/17  Yes Scot Jun, FNP  linagliptin (TRADJENTA) 5 MG TABS tablet Take 1 tablet (5 mg total) by mouth daily. 07/07/17  Yes Scot Jun, FNP  losartan (COZAAR) 100 MG tablet Take 1 tablet (100 mg total) by mouth daily. 07/07/17  Yes Scot Jun, FNP  meloxicam (MOBIC) 7.5 MG tablet Take 1 tablet (7.5 mg total) by mouth daily. 03/03/17  Yes Scot Jun, FNP  metFORMIN (GLUCOPHAGE) 1000 MG tablet Take 1 tablet (1,000 mg total) by mouth 2 (two) times daily with a meal. 07/07/17  Yes Scot Jun, FNP  Omega-3 Fatty Acids (FISH OIL) 1000 MG CAPS Take 1,000 mg by mouth daily.   Yes [provider]  spironolactone (ALDACTONE) 25 MG tablet Take 1 tablet  (25 mg total) by mouth daily. 07/07/17  Yes Scot Jun, FNP  Turmeric 500 MG CAPS Take 500 mg by mouth 2 (two) times daily.   Yes [provider]  Vitamin D, Ergocalciferol, (DRISDOL) 50000 units CAPS capsule Take 1 capsule (50,000 Units total) by mouth every 7 (seven) days. 03/01/17  Yes Scot Jun, FNP  predniSONE (DELTASONE) 20 MG tablet Take 2 tablets (40 mg total) by mouth daily with breakfast. Patient not taking: Reported on 10/13/2017 07/07/17   Scot Jun, FNP    Past Medical, Surgical Family and Social History reviewed and updated.    Objective:   Today's Vitals   10/13/17 1440 10/13/17 1447  BP: (!) 160/92 (!) 154/88  Pulse: 76   Resp: 16   Temp: 98.1 F (36.7 C)   TempSrc: Oral   SpO2: 97%   Weight: (!) 313 lb (142  kg)   Height: 5' 8"  (1.727 m)     Wt Readings from Last 3 Encounters:  10/13/17 (!) 313 lb (142 kg)  07/07/17 (!) 308 lb (139.7 kg)  06/07/17 (!) 310 lb 6.4 oz (140.8 kg)   Physical Exam  Constitutional: She is oriented to person, place, and time. She appears well-developed and well-nourished.  HENT:  Head: Normocephalic and atraumatic.  Eyes: Conjunctivae are normal. Pupils are equal, round, and reactive to light.  Neck: Normal range of motion. Neck supple.  Cardiovascular: Normal rate, regular rhythm, normal heart sounds and intact distal pulses.  Pulmonary/Chest: Effort normal and breath sounds normal.  Abdominal: Soft. Bowel sounds are normal.  Musculoskeletal:       Left shoulder: She exhibits decreased range of motion, tenderness, bony tenderness and decreased strength.  Neurological: She is alert and oriented to person, place, and time.  Skin: Skin is warm and dry.  Psychiatric: She has a normal mood and affect. Her behavior is normal. Judgment and thought content normal.    Assessment & Plan:  1. Type 2 diabetes mellitus with other circulatory complication, unspecified whether long term insulin use ([place]), A1C  today 7.7. Slight increased from prior A1C 7.4.  Reinforced the importance of consistent daily exercise and reduction of foods and drinks rich and simple sugars as measures to reduce and improve A1c.  Encouraged to take medication consistently as directed.  Notify me here at the office if blood sugars are consistently greater than 200 fasting.  No medication changes today.  2. Essential hypertension, unstable, elevated today. She has not taken medication today.  We have discussed target BP range and blood pressure goal. I have advised patient to check BP regularly and to call us back or report to clinic if the numbers are consistently higher than 140/90. We discussed the importance of compliance with medical therapy and DASH diet recommended, consequences of uncontrolled hypertension discussed. Checking CMP today. Continue current medication regimen   3. Acute pain of right shoulder, New. Will treat with naproxen 500 mg twice daily as needed for pain. Will obtain a right shoulder image as patient has a history of degenerative joint disease. If image is abnormal, will then refer patient to orthopedic.  Orders Placed This Encounter  Procedures  . DG Shoulder Right  . CBC with Differential  . Comprehensive metabolic panel  . POCT glycosylated hemoglobin (Hb A1C)  . POCT urinalysis dip (device)    Meds ordered this encounter  Medications  . DISCONTD: naproxen (NAPROSYN) 500 MG tablet    Sig: Take 1 tablet (500 mg total) by mouth 2 (two) times daily with a meal.    Dispense:  30 tablet    Refill:  0    Order Specific Question:   Supervising Provider    Answer:   Tresa Garter [id number]  . sitaGLIPtin (JANUVIA) 25 MG tablet    Sig: Take 1 tablet (25 mg total) by mouth daily.    Dispense:  90 tablet    Refill:  1    Order Specific Question:   Supervising Provider    Answer:   Tresa Garter [id number]  . naproxen (NAPROSYN) 500 MG tablet    Sig: Take 1 tablet (500 mg total) by  mouth 2 (two) times daily with a meal.    Dispense:  30 tablet    Refill:  0    Order Specific Question:   Supervising Provider    Answer:   Tresa Garter [id number]  .  cyclobenzaprine (FLEXERIL) 10 MG tablet    Sig: Take 1 tablet (10 mg total) by mouth 3 (three) times daily as needed for muscle spasms.    Dispense:  30 tablet    Refill:  0    Order Specific Question:   Supervising Provider    Answer:   Tresa Garter [[id number]]    [id number] U. Kenton Kingfisher, MSN, FNP-C [facility]  [address] [place] [place], [place] [address] [phone]

## 2017-10-14 ENCOUNTER — Telehealth: Payer: Self-pay | Admitting: Family Medicine

## 2017-10-14 DIAGNOSIS — M25511 Pain in right shoulder: Secondary | ICD-10-CM

## 2017-10-14 LAB — CBC WITH DIFFERENTIAL/PLATELET
BASOS ABS: 0 10*3/uL (ref 0.0–0.2)
Basos: 0 %
EOS (ABSOLUTE): 0.2 10*3/uL (ref 0.0–0.4)
Eos: 3 %
HEMOGLOBIN: 11.2 g/dL (ref 11.1–15.9)
Hematocrit: 36.1 % (ref 34.0–46.6)
IMMATURE GRANS (ABS): 0 10*3/uL (ref 0.0–0.1)
IMMATURE GRANULOCYTES: 0 %
LYMPHS: 48 %
Lymphocytes Absolute: 2.8 10*3/uL (ref 0.7–3.1)
MCH: 22.3 pg — AB (ref 26.6–33.0)
MCHC: 31 g/dL — ABNORMAL LOW (ref 31.5–35.7)
MCV: 72 fL — ABNORMAL LOW (ref 79–97)
MONOCYTES: 8 %
Monocytes Absolute: 0.5 10*3/uL (ref 0.1–0.9)
NEUTROS ABS: 2.4 10*3/uL (ref 1.4–7.0)
NEUTROS PCT: 41 %
PLATELETS: 360 10*3/uL (ref 150–379)
RBC: 5.02 x10E6/uL (ref 3.77–5.28)
RDW: 16.6 % — ABNORMAL HIGH (ref 12.3–15.4)
WBC: 5.9 10*3/uL (ref 3.4–10.8)

## 2017-10-14 LAB — COMPREHENSIVE METABOLIC PANEL
ALBUMIN: 4.2 g/dL (ref 3.5–5.5)
ALT: 18 IU/L (ref 0–32)
AST: 19 IU/L (ref 0–40)
Albumin/Globulin Ratio: 1.3 (ref 1.2–2.2)
Alkaline Phosphatase: 45 IU/L (ref 39–117)
BILIRUBIN TOTAL: 0.4 mg/dL (ref 0.0–1.2)
BUN/Creatinine Ratio: 16 (ref 9–23)
BUN: 11 mg/dL (ref 6–24)
CHLORIDE: 101 mmol/L (ref 96–106)
CO2: 27 mmol/L (ref 20–29)
CREATININE: 0.68 mg/dL (ref 0.57–1.00)
Calcium: 9.3 mg/dL (ref 8.7–10.2)
GFR calc non Af Amer: 102 mL/min/{1.73_m2} (ref >59)
GFR, EST AFRICAN AMERICAN: 118 mL/min/{1.73_m2} (ref >59)
Globulin, Total: 3.3 g/dL (ref 1.5–4.5)
Glucose: 95 mg/dL (ref 65–99)
Potassium: 3.6 mmol/L (ref 3.5–5.2)
Sodium: 142 mmol/L (ref 134–144)
TOTAL PROTEIN: 7.5 g/dL (ref 6.0–8.5)

## 2017-10-14 NOTE — Telephone Encounter (Signed)
Left vm for patient to call back.

## 2017-10-14 NOTE — Telephone Encounter (Signed)
Contact patient to advise her right shoulder image was significant for degenerative joint disease.  I am referring her to orthopedic services for further evaluation and management.  For now she can continue with the treatment that was prescribed during yesterday's visit.  Carroll Sage. Kenton Kingfisher, MSN, FNP-C [facility]  [address] [place],  [address] [phone]

## 2017-10-15 NOTE — Telephone Encounter (Signed)
Patient notified

## 2017-10-20 ENCOUNTER — Telehealth: Payer: Self-pay

## 2017-10-20 NOTE — Telephone Encounter (Signed)
Advised the patient I am unaware of her financial constraints and what co-pay amount would be acceptable for her.  An effort to get her on the appropriate medication regimen I highly recommend that she contacts her insurance company and obtains a list of diabetic medications and co-pays and together we can work up a adequate medication regimen to control her diabetes.  When she obtains this information she can either drop off the medication list or fax it over to the office and I will follow-up with her by phone with an updated medication plan. This will be the most proactive way to have her start on her medication without any further delays.  As I explained previously I can access the list of diabetic medications however it only gives me a tear and I am unaware of what the co-pays are within that tear.  She can contact her insurance company and obtain a list that would be financially affordable and we can go from there with developing a diabetes management plan.  Carroll Sage. Kenton Kingfisher, MSN, FNP-C [facility]  [address] [place], [place] [address] [phone]

## 2017-10-20 NOTE — Telephone Encounter (Signed)
Patient states that the 30 day supply of the Januvia was $140 dollars and she can't afford that. Patient wants to know if there is a different medication that would be cheaper or is the another way to manage without this medication

## 2017-10-21 NOTE — Telephone Encounter (Signed)
Patient will call her insurance and give [place] a callback with information

## 2017-10-21 NOTE — Telephone Encounter (Signed)
Left a vm for patient to callback 

## 2017-11-01 ENCOUNTER — Encounter (INDEPENDENT_AMBULATORY_CARE_PROVIDER_SITE_OTHER): Payer: Self-pay | Admitting: Orthopaedic Surgery

## 2017-11-01 ENCOUNTER — Ambulatory Visit (INDEPENDENT_AMBULATORY_CARE_PROVIDER_SITE_OTHER): Payer: Managed Care, Other (non HMO) | Admitting: Orthopaedic Surgery

## 2017-11-01 VITALS — Resp 18 | Ht 66.0 in | Wt 323.0 lb

## 2017-11-01 DIAGNOSIS — M25511 Pain in right shoulder: Secondary | ICD-10-CM | POA: Diagnosis not present

## 2017-11-01 DIAGNOSIS — G8929 Other chronic pain: Secondary | ICD-10-CM

## 2017-11-01 NOTE — Progress Notes (Signed)
Office Visit Note   Patient: Claudia [name]           Date of Birth: 04-04-1966           MRN: [id number] Visit Date: 11/01/2017              Requested by: Scot Jun, [place], [place] [address] PCP: Scot Jun, FNP   Assessment & Plan: Visit Diagnoses:  1. Chronic right shoulder pain     Plan: Recurrent impingement syndrome right shoulder. Long discussion regarding diagnostic possibilities. I have injected the subacromial space with Zilretta. We will monitor her course over the next several weeks and consider further diagnostic studies.  Follow-Up Instructions: Return if symptoms worsen or fail to improve.   Orders:  Orders Placed This Encounter  Procedures  . Large Joint Inj: R subacromial bursa   No orders of the defined types were placed in this encounter.     Procedures: Large Joint Inj: R subacromial bursa on 11/01/2017 3:39 PM Indications: pain and diagnostic evaluation Details: 25 G 1.5 in needle, anterolateral approach  Arthrogram: No  Medications: (zilretta) Consent was given by the patient. Immediately prior to procedure a time out was called to verify the correct patient, procedure, equipment, support staff and site/side marked as required. Patient was prepped and draped in the usual sterile fashion.       Clinical Data: No additional findings.   Subjective: Chief Complaint  Patient presents with  . Right Shoulder - Pain, Edema    R shoulder pain x 1 month ago. History of rotator cuff injury, although denies any recent .Pain is exacerbated by lying on her R side. She has attempted relief with ibuprofen , heat, and ice pack. Reports associated R  hand weakness and difficulty grippi  Mrs. Claudia [name] has a long history of problems referable to her right shoulder. She underwent what she believes was a rotator cuff tear repair approximately 6-7 years ago. 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 Approximately 3 months ago she started to experience insidious onset of right shoulder pain without injury or trauma. She still having difficulty with painful overhead activities. No numbness tingling or neck pain. She also was had a prior neck fusion after her rotator cuff tear repair. Both surgeries were performed through [facility]. She recently was seen by her primary care physician with x-rays. I did review those on the PACS system. This been a distal clavicle resection. There is still some downsloping of the lateral acromion. The humeral head is centered about the glenoid. No ectopic calcification or obvious arthritic changes HPI  Review of Systems  Constitutional: Negative for chills, fatigue and fever.  Eyes: Negative for itching.  Respiratory: Negative for chest tightness and shortness of breath.   Cardiovascular: Negative for chest pain, palpitations and leg swelling.  Gastrointestinal: Negative for blood in stool, constipation and diarrhea.  Endocrine: Negative for polyuria.  Genitourinary: Negative for dysuria.  Musculoskeletal: Positive for back pain and neck stiffness. Negative for joint swelling and neck pain.  Allergic/Immunologic: Negative for immunocompromised state.  Neurological: Positive for dizziness and weakness. Negative for numbness.  Hematological: Does not bruise/bleed easily.  Psychiatric/Behavioral: Positive for sleep disturbance. The patient is not nervous/anxious.      Objective: Vital Signs: Resp 18   Ht 5\' 6"  (1.676 m)  Wt (!) 323 lb (146.5 kg)   LMP 05/09/2012   BMI 52.13 kg/m   Physical Exam  Ortho Exam awake alert and oriented 3. Comfortable sitting. Morbidly obese with a BMI of 52. Able to place a right arm fully overhead but does have positive impingement and positive empty can testing. Painful Motion in the impingement  position. Skin intact. Neurovascular exam intact. Appears to have good strength. Difficult to determine position of biceps given the size of the arm  Specialty Comments:  No specialty comments available.  Imaging: No results found.   PMFS History: Patient Active Problem List   Diagnosis Date Noted  . Chronic pain of left knee 07/15/2017  . Chronic pain of right knee 07/15/2017  . Unilateral primary osteoarthritis, left knee 07/15/2017  . Unilateral primary osteoarthritis, right knee 07/15/2017  . Chest pain   . Benign essential HTN   . Cardiomyopathy ([facility]) 03/16/2014  . Radiculopathy 09/07/2013  . Hypokalemia 06/02/2012  . HTN (hypertension) 06/01/2012  . DM (diabetes mellitus) ([facility]) 06/01/2012  . Hyperlipidemia 06/01/2012  . Dyspnea on exertion 06/01/2012  . Obesity 06/01/2012   Past Medical History:  Diagnosis Date  . Diabetes mellitus   . Fibroids   . Headache(784.0)   . History of echocardiogram    Echo 7/18: Moderate concentric LVH, EF 55-60, normal wall motion, grade 1 diastolic dysfunction, calcified aortic leaflets  . Hypercholesterolemia   . Hyperlipidemia   . Hypertension   . Osteoarthritis    , with mild meniscus tear, followed by orthopedics--Dr Gladstone Lighter 04/29/10  . Right knee pain   . Seizures ([facility])    as child  . Vitamin D deficiency     Family History  Problem Relation Age of Onset  . Hypertension Mother   . Diabetes Mother   . Hypercholesterolemia Mother   . Hypertension Sister   . CVA Maternal Grandmother   . Hypertension Maternal Grandmother   . Diabetes Maternal Grandmother   . Heart attack Neg Hx     Past Surgical History:  Procedure Laterality Date  . ANTERIOR CERVICAL DECOMP/DISCECTOMY FUSION N/A 09/07/2013   Procedure: ANTERIOR CERVICAL DECOMPRESSION/DISCECTOMY FUSION 1 LEVEL;  Surgeon: Sinclair Ship, MD;  Location: [facility];  Service: Orthopedics;  Laterality: N/A;  Anterior cervical decompression fusion, cervical 5-6 with  instrumentation and allograft  . LAPAROSCOPIC ASSISTED VAGINAL HYSTERECTOMY  1/14  . SHOULDER SURGERY Right 11   rotator cuff  . TUBAL LIGATION    . UMBILICAL HERNIA REPAIR     Social History   Occupational History  . Not on file  Tobacco Use  . Smoking status: Never Smoker  . Smokeless tobacco: Never Used  Substance and Sexual Activity  . Alcohol use: No  . Drug use: No  . Sexual activity: Not on file

## 2017-11-15 ENCOUNTER — Other Ambulatory Visit: Payer: Self-pay

## 2017-11-22 ENCOUNTER — Other Ambulatory Visit: Payer: Self-pay

## 2017-11-22 MED ORDER — VITAMIN D (ERGOCALCIFEROL) 1.25 MG (50000 UNIT) PO CAPS: 50000.0000 [IU] | ORAL_CAPSULE | ORAL | 0 refills | Status: DC

## 2017-12-21 NOTE — Progress Notes (Signed)
CARDIOLOGY OFFICE NOTE  Date:  12/22/2017    Claudia [name] Date of Birth: [birth year] Medical Record #[id number]  PCP:  Scot Jun, FNP  Cardiologist:  [facility]  Chief Complaint  Patient presents with  . Congestive Heart Failure    Follow up visit - seen for Dr. Marlou Porch    History of Present Illness: Claudia [name] is a 52 y.o. female who presents today for a 6 month check. Seen for Dr. Marlou Porch.   He has a history of hypertension, diabetes, hyperlipidemia, cardiomyopathy, systolic LV dysfunction with ejection fraction 40-45% with moderate LVH in the setting of hypertensive urgency. EF has improved per echo in 2016 and 2018.    Last seen by me in July of 2018. Last seen by Dr. Marlou Porch in September and was doing ok.   Comes in today. Here alone. Says she is "trying to get myself together". She is not drinking "pop" anymore. She is off her Coreg - unclear as to why. She was started on Aldactone - sounds like for "fluid". She has lost about 10 pounds since last visit here. 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 No chest pain. Not short of breath unless she gets upset. Swelling has improved with the Aldactone. HR is elevated today. She is otherwise doing ok.   Past Medical History:  Diagnosis Date  . Diabetes mellitus   . Fibroids   . Headache(784.0)   . History of echocardiogram    Echo 7/18: Moderate concentric LVH, EF 55-60, normal wall motion, grade 1 diastolic dysfunction, calcified aortic leaflets  . Hypercholesterolemia   . Hyperlipidemia   . Hypertension   . Osteoarthritis    , with mild meniscus tear, followed by orthopedics--Dr Gladstone Lighter 04/29/10  . Right knee pain   . Seizures ([place])    as child  . Vitamin D deficiency     Past Surgical History:  Procedure Laterality Date  . ANTERIOR CERVICAL DECOMP/DISCECTOMY FUSION N/A 09/07/2013   Procedure: ANTERIOR  CERVICAL DECOMPRESSION/DISCECTOMY FUSION 1 LEVEL;  Surgeon: Sinclair Ship, MD;  Location: [place];  Service: Orthopedics;  Laterality: N/A;  Anterior cervical decompression fusion, cervical 5-6 with instrumentation and allograft  . LAPAROSCOPIC ASSISTED VAGINAL HYSTERECTOMY  1/14  . SHOULDER SURGERY Right 11   rotator cuff  . TUBAL LIGATION    . UMBILICAL HERNIA REPAIR       Medications: Current Meds  Medication Sig  . amLODipine (NORVASC) 10 MG tablet Take 0.5 tablets (5 mg total) by mouth daily.  . blood glucose meter kit and supplies KIT Dispense based on patient and insurance preference. Use up to four times daily as directed. (FOR ICD-9 250.00, 250.01). Check blood sugar every morning and at bedtime.  As needed if sick.  . ferrous sulfate 325 (65 FE) MG tablet Take 325 mg by mouth 2 (two) times daily.  Marland Kitchen glipiZIDE (GLUCOTROL) 5 MG tablet TAKE 1 TABLET BY MOUTH TWICE DAILY BEFORE  A  MEAL  . hydrochlorothiazide (HYDRODIURIL) 25 MG tablet Take 1 tablet (25 mg total) by mouth daily.  Marland Kitchen losartan (COZAAR) 100 MG tablet Take 1 tablet (100 mg total) by mouth daily.  . metFORMIN (GLUCOPHAGE) 1000 MG tablet Take 1 tablet (1,000 mg total) by mouth 2 (two) times daily with a meal.  . sitaGLIPtin (JANUVIA) 25 MG tablet Take 1 tablet (25 mg total) by mouth daily.  Marland Kitchen  spironolactone (ALDACTONE) 25 MG tablet Take 1 tablet (25 mg total) by mouth daily.  . Turmeric 500 MG CAPS Take 500 mg by mouth 2 (two) times daily.  . Vitamin D, Ergocalciferol, (DRISDOL) 50000 units CAPS capsule Take 1 capsule (50,000 Units total) by mouth every 7 (seven) days.  . [DISCONTINUED] albuterol (PROVENTIL HFA;VENTOLIN HFA) 108 (90 Base) MCG/ACT inhaler Inhale 2 puffs into the lungs every 4 (four) hours as needed for wheezing or shortness of breath (cough, shortness of breath or wheezing.).  . [DISCONTINUED] amLODipine (NORVASC) 10 MG tablet TAKE ONE TABLET BY MOUTH ONCE DAILY  . [DISCONTINUED] amLODipine (NORVASC) 10  MG tablet Take 1 tablet (10 mg total) by mouth daily.  . [DISCONTINUED] carvedilol (COREG) 25 MG tablet Take 1.5 tablets (37.5 mg total) by mouth 2 (two) times daily with a meal.  . [DISCONTINUED] cyclobenzaprine (FLEXERIL) 10 MG tablet Take 1 tablet (10 mg total) by mouth 3 (three) times daily as needed for muscle spasms.  . [DISCONTINUED] naproxen (NAPROSYN) 500 MG tablet Take 1 tablet (500 mg total) by mouth 2 (two) times daily with a meal.  . [DISCONTINUED] Omega-3 Fatty Acids (FISH OIL) 1000 MG CAPS Take 1,000 mg by mouth daily.  . [DISCONTINUED] predniSONE (DELTASONE) 20 MG tablet Take 2 tablets (40 mg total) by mouth daily with breakfast.     Allergies: Allergies  Allergen Reactions  . Crestor [Rosuvastatin]     MUSCLE ACHES HEADACHES   . Hydrocodone Itching  . Other Hives, Swelling and Other (See Comments)    Vision changes; ALLERGIC TO ALL NUTS EXCEPT PEANUTS  . Oxycodone Itching  . Pravachol [Pravastatin Sodium] Itching    Joint pain  . Simvastatin Hives    Social History: The patient  reports that she has never smoked. She has never used smokeless tobacco. She reports that she does not drink alcohol or use drugs.   Family History: The patient's family history includes CVA in her maternal grandmother; Diabetes in her maternal grandmother and mother; Hypercholesterolemia in her mother; Hypertension in her maternal grandmother, mother, and sister.   Review of Systems: Please see the history of present illness.   Otherwise, the review of systems is positive for none.   All other systems are reviewed and negative.   Physical Exam: VS:  BP 126/82   Pulse (!) 102   Ht 5' 6"  (1.676 m)   Wt (!) 300 lb 12.8 oz (136.4 kg)   LMP 05/09/2012   SpO2 95%   BMI 48.55 kg/m  .  BMI Body mass index is 48.55 kg/m.  Wt Readings from Last 3 Encounters:  12/22/17 (!) 300 lb 12.8 oz (136.4 kg)  11/01/17 (!) 323 lb (146.5 kg)  10/13/17 (!) 313 lb (142 kg)    General: Pleasant.  Obese. Alert and in no acute distress.  She is losing weight.  HEENT: Normal.  Neck: Supple, no JVD, carotid bruits, or masses noted.  Cardiac: Regular rate and rhythm. HR is fast.  No edema.  Respiratory:  Lungs are clear to auscultation bilaterally with normal work of breathing.  GI: Soft and nontender.  MS: No deformity or atrophy. Gait and ROM intact.  Skin: Warm and dry. Color is normal.  Neuro:  Strength and sensation are intact and no gross focal deficits noted.  Psych: Alert, appropriate and with normal affect.   LABORATORY DATA:  EKG:  EKG is not ordered today.   Lab Results  Component Value Date   WBC 5.9 10/13/2017   HGB  11.2 10/13/2017   HCT 36.1 10/13/2017   PLT 360 10/13/2017   GLUCOSE 95 10/13/2017   CHOL 173 02/25/2017   TRIG 123 02/25/2017   HDL 46 (L) 02/25/2017   LDLCALC 102 (H) 02/25/2017   ALT 18 10/13/2017   AST 19 10/13/2017   NA 142 10/13/2017   K 3.6 10/13/2017   CL 101 10/13/2017   CREATININE 0.68 10/13/2017   BUN 11 10/13/2017   CO2 27 10/13/2017   TSH 0.52 02/25/2017   INR 0.86 09/05/2013   HGBA1C 7.7 10/13/2017   MICROALBUR 3.7 03/01/2017     BNP (last 3 results) Recent Labs    02/19/17 1104 02/25/17 1536  BNP 23.7 6.2    ProBNP (last 3 results) Recent Labs    04/12/17 0839  PROBNP 12     Other Studies Reviewed Today:  Echo Study Conclusions 03/2017  - Left ventricle: The cavity size was normal. There was moderate   concentric hypertrophy. Systolic function was normal. The   estimated ejection fraction was in the range of 55% to 60%. Wall   motion was normal; there were no regional wall motion   abnormalities. There was an increased relative contribution of   atrial contraction to ventricular filling. Doppler parameters are   consistent with abnormal left ventricular relaxation (grade 1   diastolic dysfunction). - Aortic valve: Trileaflet; mildly calcified leaflets.  ASSESSMENT AND PLAN:  1. NICM - she is actually  doing well. EF has normalized by last echo. Seems to be on track better with taking care of herself and motivated. Restarting Coreg today to help with her HR. Lab today.   2. HTN - BP looks great - adding back Coreg for her HR and will cut the Norvasc back. May try to get her off this altogether on return.   3. Prior chest pain - has not recurred.   4. Obesity - she is actively losing weight - her breathing has improved - she is congratulated.   5. HLD - statin intolerant.   Current medicines are reviewed with the patient today.  The patient does not have concerns regarding medicines other than what has been noted above.  The following changes have been made:  See above.  Labs/ tests ordered today include:    Orders Placed This Encounter  Procedures  . Basic metabolic panel     Disposition:   FU with me in about 2 months.   Patient is agreeable to this plan and will call if any problems develop in the interim.   SignedTruitt Merle, NP  12/22/2017 11:46 AM  [facility] [address] [place], [place]  [address] Phone: [phone] Fax: [phone]

## 2017-12-22 ENCOUNTER — Encounter: Payer: Self-pay | Admitting: Nurse Practitioner

## 2017-12-22 ENCOUNTER — Ambulatory Visit (INDEPENDENT_AMBULATORY_CARE_PROVIDER_SITE_OTHER): Payer: Managed Care, Other (non HMO) | Admitting: Nurse Practitioner

## 2017-12-22 VITALS — BP 126/82 | HR 102 | Ht 66.0 in | Wt 300.8 lb

## 2017-12-22 DIAGNOSIS — I428 Other cardiomyopathies: Secondary | ICD-10-CM

## 2017-12-22 DIAGNOSIS — I10 Essential (primary) hypertension: Secondary | ICD-10-CM

## 2017-12-22 MED ORDER — AMLODIPINE BESYLATE 10 MG PO TABS: 5.0000 mg | ORAL_TABLET | Freq: Every day | ORAL | 1 refills | Status: DC

## 2017-12-22 MED ORDER — CARVEDILOL 6.25 MG PO TABS: 6.2500 mg | ORAL_TABLET | Freq: Two times a day (BID) | ORAL | 3 refills | Status: DC

## 2017-12-22 NOTE — Patient Instructions (Signed)
We will be checking the following labs today - BMET   Medication Instructions:    Continue with your current medicines. BUT  I am cutting the Norvasc back to 5 mg a day - you can break your 10 mg tablet  Adding back Coreg 6.25 mg twice a day - this is at your pharmacy    Testing/Procedures To Be Arranged:  N/A  Follow-Up:   See me in about 2 months    Other Special Instructions:   Keep up the good work!    If you need a refill on your cardiac medications before your next appointment, please call your pharmacy.   Call the [place] office at [phone] if you have any questions, problems or concerns.

## 2017-12-23 LAB — BASIC METABOLIC PANEL
BUN/Creatinine Ratio: 21 (ref 9–23)
BUN: 16 mg/dL (ref 6–24)
CO2: 26 mmol/L (ref 20–29)
Calcium: 10.1 mg/dL (ref 8.7–10.2)
Chloride: 96 mmol/L (ref 96–106)
Creatinine, Ser: 0.75 mg/dL (ref 0.57–1.00)
GFR calc Af Amer: 107 mL/min/{1.73_m2} (ref >59)
GFR calc non Af Amer: 93 mL/min/{1.73_m2} (ref >59)
Glucose: 216 mg/dL — ABNORMAL HIGH (ref 65–99)
Potassium: 3.9 mmol/L (ref 3.5–5.2)
Sodium: 141 mmol/L (ref 134–144)

## 2017-12-23 NOTE — Telephone Encounter (Signed)
Note no needed

## 2018-02-16 ENCOUNTER — Ambulatory Visit (INDEPENDENT_AMBULATORY_CARE_PROVIDER_SITE_OTHER): Payer: Managed Care, Other (non HMO) | Admitting: Nurse Practitioner

## 2018-02-16 ENCOUNTER — Encounter: Payer: Self-pay | Admitting: Nurse Practitioner

## 2018-02-16 VITALS — HR 85 | Ht 63.0 in | Wt 303.0 lb

## 2018-02-16 DIAGNOSIS — I429 Cardiomyopathy, unspecified: Secondary | ICD-10-CM | POA: Diagnosis not present

## 2018-02-16 NOTE — Patient Instructions (Addendum)
We will be checking the following labs today - NONE   Medication Instructions:    Continue with your current medicines.     Testing/Procedures To Be Arranged:  N/A  Follow-Up:   See me in 4 months.     Other Special Instructions:   Think about what we talked about today.     If you need a refill on your cardiac medications before your next appointment, please call your pharmacy.   Call the [place] office at [phone] if you have any questions, problems or concerns.

## 2018-02-16 NOTE — Progress Notes (Signed)
CARDIOLOGY OFFICE NOTE  Date:  02/16/2018    Claudia [name] Date of Birth: 1966-04-21 Medical Record #[id number]  PCP:  Scot Jun, FNP  Cardiologist:  Marisa Cyphers    Chief Complaint  Patient presents with  . Congestive Heart Failure  . Cardiomyopathy    Follow up visit - seen for Dr. Marlou Porch    History of Present Illness: Claudia [name] is a 51 y.o. female who presents today for a 2 month check. Seen for Dr. Marlou Porch.   She has a history of hypertension, diabetes, hyperlipidemia, cardiomyopathy, systolic LV dysfunction with ejection fraction 40-45% with moderate LVH in the setting of hypertensive urgency. EF has improved per echo in 2016 and 2018.    Last seen by Dr. Marlou Porch in September of 2018 and was doing ok. I then saw her about 2 months ago for a follow up visit. She was "trying to get myself together". Off Coreg for unknown reasons and her HR was over a 100 - we restarted this but had to start at a lower dose. Previously on 37.5 mg BID.  Her cardiac symptoms were noted to be stable.   Comes in today. Here alone. She notes she is doing well. She is frustrated about her weight. Her husband does not support her efforts at her weight loss - he buys the food - she then eats it since it is available. She wants to do better. Her breathing is ok. 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 She is happy for the most part about how she is doing. She does have some fatigue - she is raising several small children. Her sleep patterns are disrupted.   Past Medical History:  Diagnosis Date  . Diabetes mellitus   . Fibroids   . Headache(784.0)   . History of echocardiogram    Echo 7/18: Moderate concentric LVH, EF 55-60, normal wall motion, grade 1 diastolic dysfunction, calcified aortic leaflets  . Hypercholesterolemia   . Hyperlipidemia   . Hypertension   . Osteoarthritis    , with mild meniscus tear, followed by orthopedics--Dr Gladstone Lighter 04/29/10  .  Right knee pain   . Seizures ([place])    as child  . Vitamin D deficiency     Past Surgical History:  Procedure Laterality Date  . ANTERIOR CERVICAL DECOMP/DISCECTOMY FUSION N/A 09/07/2013   Procedure: ANTERIOR CERVICAL DECOMPRESSION/DISCECTOMY FUSION 1 LEVEL;  Surgeon: Sinclair Ship, MD;  Location: [facility];  Service: Orthopedics;  Laterality: N/A;  Anterior cervical decompression fusion, cervical 5-6 with instrumentation and allograft  . LAPAROSCOPIC ASSISTED VAGINAL HYSTERECTOMY  1/14  . SHOULDER SURGERY Right 11   rotator cuff  . TUBAL LIGATION    . UMBILICAL HERNIA REPAIR       Medications: Current Meds  Medication Sig  . amLODipine (NORVASC) 10 MG tablet Take 0.5 tablets (5 mg total) by mouth daily.  . blood glucose meter kit and supplies KIT Dispense based on patient and insurance preference. Use up to four times daily as directed. (FOR ICD-9 250.00, 250.01). Check blood sugar every morning and at bedtime.  As needed if sick.  . carvedilol (COREG) 6.25 MG tablet Take 1 tablet (6.25 mg total) by mouth 2 (two) times daily.  . ferrous sulfate 325 (65 FE) MG tablet Take 325 mg by mouth 2 (two) times daily.  Marland Kitchen glipiZIDE (GLUCOTROL) 5 MG tablet TAKE 1 TABLET BY MOUTH TWICE DAILY BEFORE  A  MEAL  . hydrochlorothiazide (HYDRODIURIL)  25 MG tablet Take 1 tablet (25 mg total) by mouth daily.  Marland Kitchen losartan (COZAAR) 100 MG tablet Take 1 tablet (100 mg total) by mouth daily.  . metFORMIN (GLUCOPHAGE) 1000 MG tablet Take 1 tablet (1,000 mg total) by mouth 2 (two) times daily with a meal.  . sitaGLIPtin (JANUVIA) 25 MG tablet Take 1 tablet (25 mg total) by mouth daily.  Marland Kitchen spironolactone (ALDACTONE) 25 MG tablet Take 1 tablet (25 mg total) by mouth daily.  . Turmeric 500 MG CAPS Take 500 mg by mouth 2 (two) times daily.  . Vitamin D, Ergocalciferol, (DRISDOL) 50000 units CAPS capsule Take 1 capsule (50,000 Units total) by mouth every 7 (seven) days.     Allergies: Allergies  Allergen  Reactions  . Crestor [Rosuvastatin]     MUSCLE ACHES HEADACHES   . Hydrocodone Itching  . Other Hives, Swelling and Other (See Comments)    Vision changes; ALLERGIC TO ALL NUTS EXCEPT PEANUTS  . Oxycodone Itching  . Pravachol [Pravastatin Sodium] Itching    Joint pain  . Simvastatin Hives    Social History: The patient  reports that she has never smoked. She has never used smokeless tobacco. She reports that she does not drink alcohol or use drugs.   Family History: The patient's family history includes CVA in her maternal grandmother; Diabetes in her maternal grandmother and mother; Hypercholesterolemia in her mother; Hypertension in her maternal grandmother, mother, and sister.   Review of Systems: Please see the history of present illness.   Otherwise, the review of systems is positive for none.   All other systems are reviewed and negative.   Physical Exam: VS:  Pulse 85   Ht 5' 3" (1.6 m)   Wt (!) 303 lb (137.4 kg)   LMP 05/09/2012   BMI 53.67 kg/m  .  BMI Body mass index is 53.67 kg/m.  Wt Readings from Last 3 Encounters:  02/16/18 (!) 303 lb (137.4 kg)  12/22/17 (!) 300 lb 12.8 oz (136.4 kg)  11/01/17 (!) 323 lb (146.5 kg)   Her BP was 110 palpated by me.   General: Pleasant. Morbidly obese. Alert and in no acute distress.   HEENT: Normal.  Neck: Supple, no JVD, carotid bruits, or masses noted.  Cardiac: Regular rate and rhythm. No murmurs, rubs, or gallops. No edema.  Respiratory:  Lungs are clear to auscultation bilaterally with normal work of breathing.  GI: Soft and nontender.  MS: No deformity or atrophy. Gait and ROM intact.  Skin: Warm and dry. Color is normal.  Neuro:  Strength and sensation are intact and no gross focal deficits noted.  Psych: Alert, appropriate and with normal affect.   LABORATORY DATA:  EKG:  EKG is ordered today. This demonstrates NSR with HR of 85 today.  Lab Results  Component Value Date   WBC 5.9 10/13/2017   HGB 11.2  10/13/2017   HCT 36.1 10/13/2017   PLT 360 10/13/2017   GLUCOSE 216 (H) 12/22/2017   CHOL 173 02/25/2017   TRIG 123 02/25/2017   HDL 46 (L) 02/25/2017   LDLCALC 102 (H) 02/25/2017   ALT 18 10/13/2017   AST 19 10/13/2017   NA 141 12/22/2017   K 3.9 12/22/2017   CL 96 12/22/2017   CREATININE 0.75 12/22/2017   BUN 16 12/22/2017   CO2 26 12/22/2017   TSH 0.52 02/25/2017   INR 0.86 09/05/2013   HGBA1C 7.7 10/13/2017   MICROALBUR 3.7 03/01/2017     BNP (last  3 results) Recent Labs    02/19/17 1104 02/25/17 1536  BNP 23.7 6.2    ProBNP (last 3 results) Recent Labs    04/12/17 0839  PROBNP 12     Other Studies Reviewed Today:  Echo Study Conclusions 03/2017  - Left ventricle: The cavity size was normal. There was moderate concentric hypertrophy. Systolic function was normal. The estimated ejection fraction was in the range of 55% to 60%. Wall motion was normal; there were no regional wall motion abnormalities. There was an increased relative contribution of atrial contraction to ventricular filling. Doppler parameters are consistent with abnormal left ventricular relaxation (grade 1 diastolic dysfunction). - Aortic valve: Trileaflet; mildly calcified leaflets.  ASSESSMENT AND PLAN:  1.NICM -  EF has normalized by last echo. She is trying to get on track - she wishes her husband was more supportive - we talked about this in depth today. HR has improved with low dose Coreg - BP is a little soft - we will continue with her current regimen for now.   2. HTN - BP ok - no changes made today.   3. Prior chest pain - has not recurred.   4. Obesity - discussed again at length - she is hopeful her husband will be supportive.   5. HLD - statin intolerant. Not discussed today.    Current medicines are reviewed with the patient today.  The patient does not have concerns regarding medicines other than what has been noted above.  The following changes  have been made:  See above.  Labs/ tests ordered today include:    Orders Placed This Encounter  Procedures  . EKG 12-Lead     Disposition:   FU with me in about 4 months.    Patient is agreeable to this plan and will call if any problems develop in the interim.   SignedTruitt Merle, NP  02/16/2018 10:04 AM  [facility] [address] [place], [place]  [address] Phone: [phone] Fax: [phone]

## 2018-02-23 IMAGING — CR DG CHEST 2V
2 series · 2 of 2 positions shown · non-contrast
Comparison: 02/19/2017

CLINICAL DATA: Chest pain

EXAM:
CHEST  2 VIEW

[w chest pa]
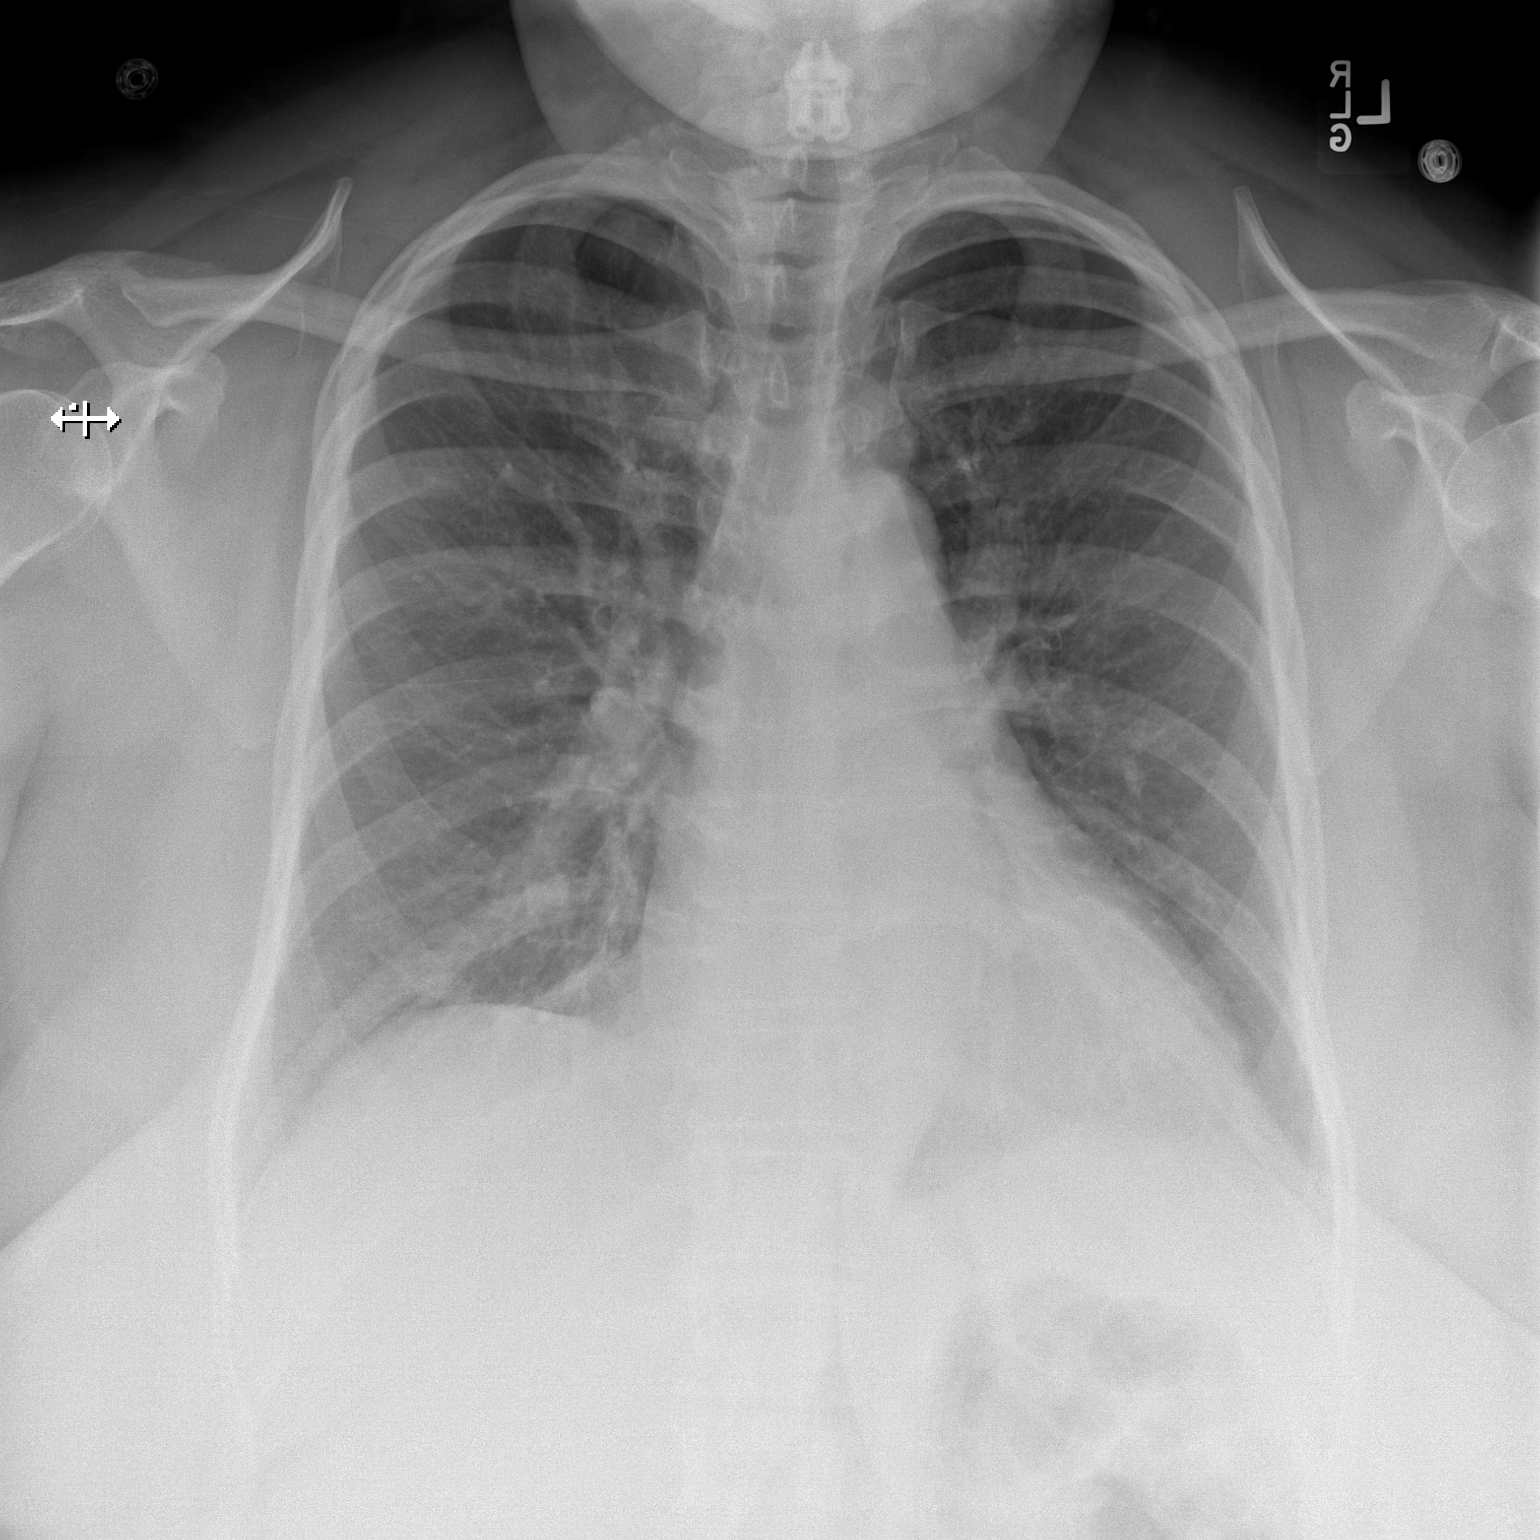

[w chest lat]
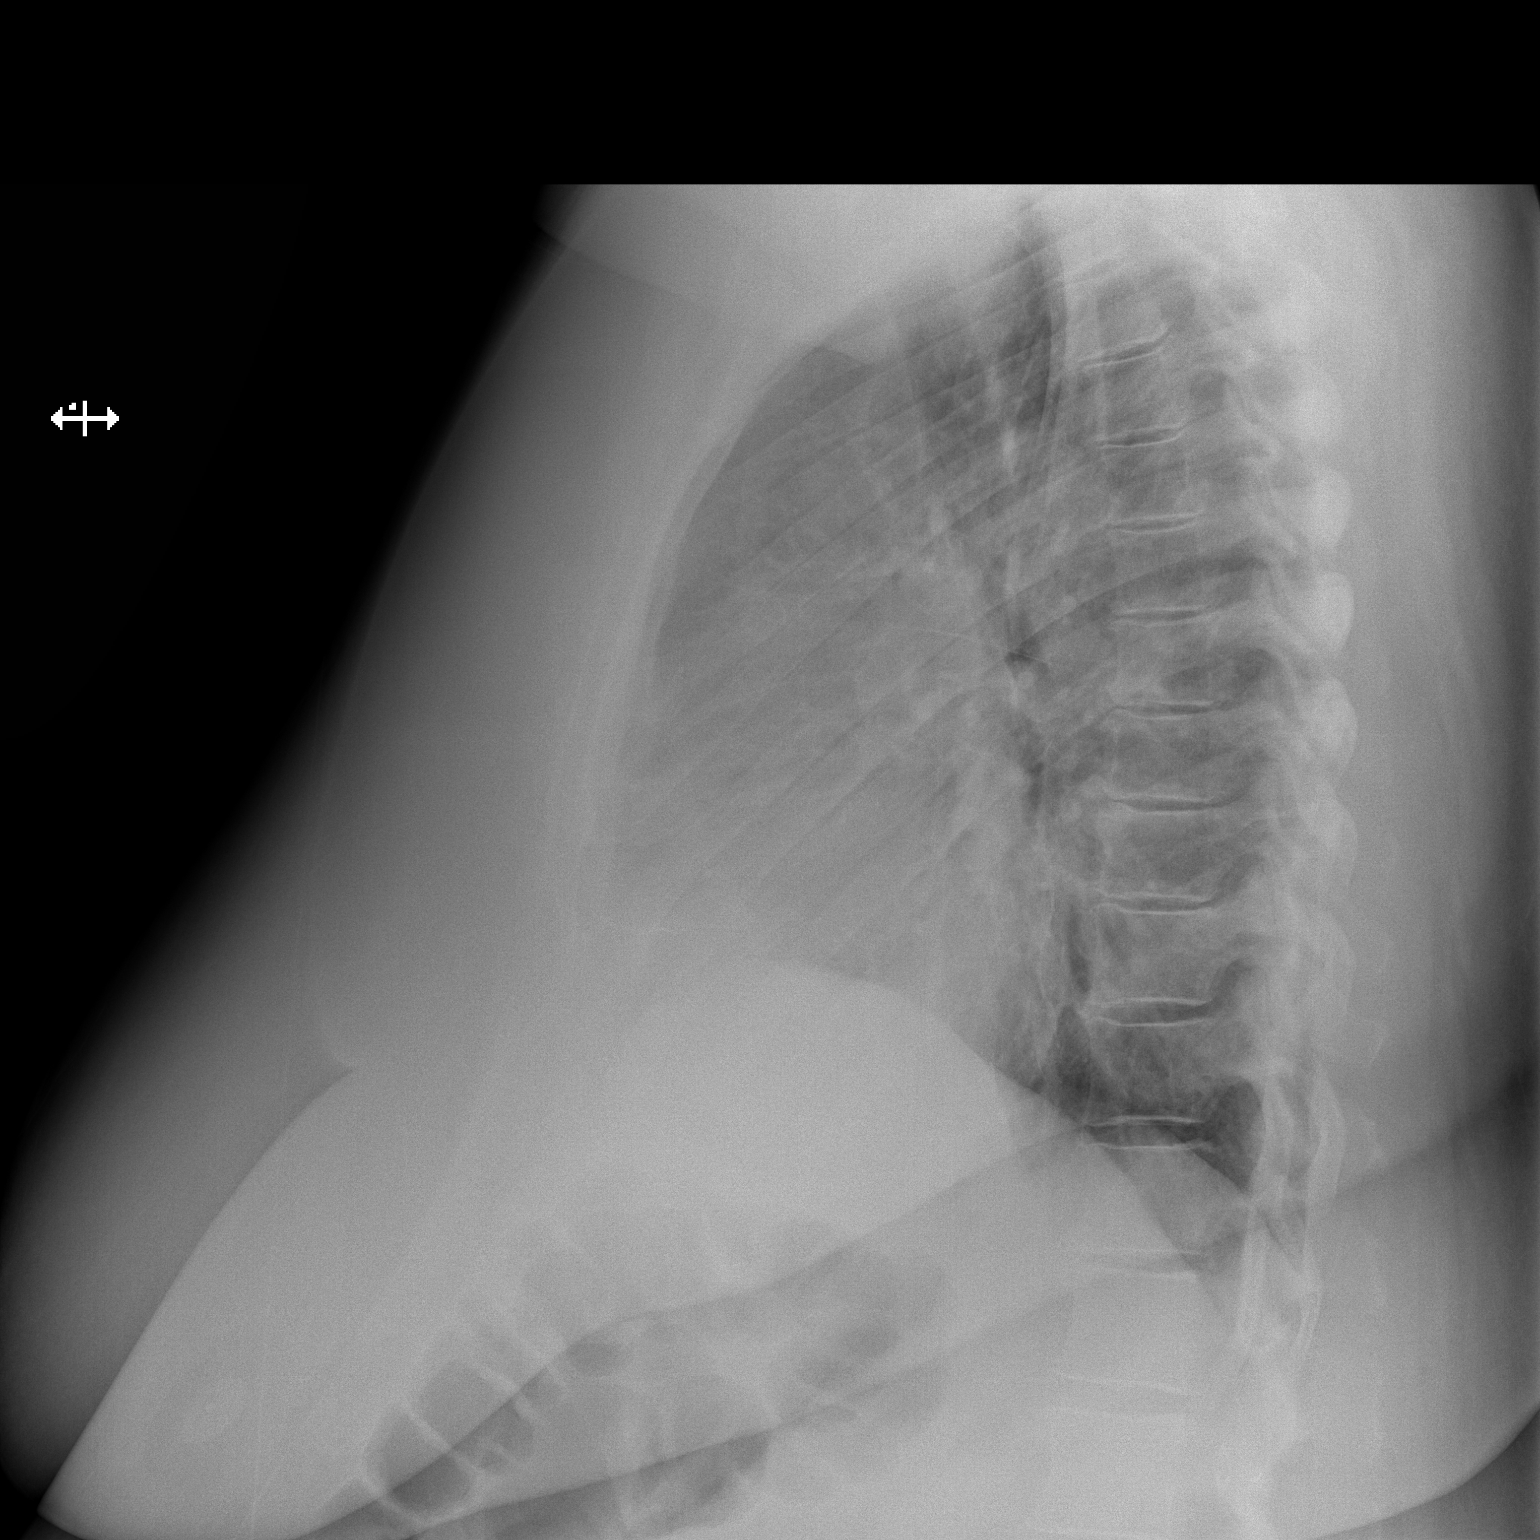

[2 of 2 positions shown; findings below may reference images not displayed]

FINDINGS: Chronic cardiomegaly and aortic tortuosity. Chronic mild
interstitial coarsening at the bases. There is no edema,
consolidation, effusion, or pneumothorax.
IMPRESSION: Stable from prior.  No evidence of active disease.

Cardiomegaly.

## 2018-04-24 ENCOUNTER — Inpatient Hospital Stay (HOSPITAL_COMMUNITY)
Admission: EM | Admit: 2018-04-24 | Discharge: 2018-04-26 | DRG: 281 | Disposition: A | Discharge status: Home / Self Care | Payer: Managed Care, Other (non HMO) | Attending: Cardiology | Admitting: Cardiology

## 2018-04-24 ENCOUNTER — Encounter (HOSPITAL_COMMUNITY): Payer: Self-pay

## 2018-04-24 ENCOUNTER — Other Ambulatory Visit: Payer: Self-pay

## 2018-04-24 ENCOUNTER — Emergency Department (HOSPITAL_COMMUNITY): Payer: Managed Care, Other (non HMO)

## 2018-04-24 DIAGNOSIS — I10 Essential (primary) hypertension: Secondary | ICD-10-CM | POA: Diagnosis present

## 2018-04-24 DIAGNOSIS — I214 Non-ST elevation (NSTEMI) myocardial infarction: Principal | ICD-10-CM | POA: Diagnosis present

## 2018-04-24 DIAGNOSIS — I5022 Chronic systolic (congestive) heart failure: Secondary | ICD-10-CM | POA: Diagnosis not present

## 2018-04-24 DIAGNOSIS — I160 Hypertensive urgency: Secondary | ICD-10-CM | POA: Diagnosis present

## 2018-04-24 DIAGNOSIS — Z79899 Other long term (current) drug therapy: Secondary | ICD-10-CM | POA: Diagnosis not present

## 2018-04-24 DIAGNOSIS — I5042 Chronic combined systolic (congestive) and diastolic (congestive) heart failure: Secondary | ICD-10-CM | POA: Diagnosis present

## 2018-04-24 DIAGNOSIS — I428 Other cardiomyopathies: Secondary | ICD-10-CM | POA: Diagnosis present

## 2018-04-24 DIAGNOSIS — Z6841 Body Mass Index (BMI) 40.0 and over, adult: Secondary | ICD-10-CM | POA: Diagnosis not present

## 2018-04-24 DIAGNOSIS — E7800 Pure hypercholesterolemia, unspecified: Secondary | ICD-10-CM | POA: Diagnosis present

## 2018-04-24 DIAGNOSIS — I5020 Unspecified systolic (congestive) heart failure: Secondary | ICD-10-CM

## 2018-04-24 DIAGNOSIS — I110 Hypertensive heart disease with heart failure: Secondary | ICD-10-CM | POA: Diagnosis present

## 2018-04-24 DIAGNOSIS — E119 Type 2 diabetes mellitus without complications: Secondary | ICD-10-CM | POA: Diagnosis present

## 2018-04-24 DIAGNOSIS — E785 Hyperlipidemia, unspecified: Secondary | ICD-10-CM | POA: Diagnosis present

## 2018-04-24 DIAGNOSIS — Z885 Allergy status to narcotic agent status: Secondary | ICD-10-CM | POA: Diagnosis not present

## 2018-04-24 DIAGNOSIS — E1169 Type 2 diabetes mellitus with other specified complication: Secondary | ICD-10-CM

## 2018-04-24 DIAGNOSIS — Z7984 Long term (current) use of oral hypoglycemic drugs: Secondary | ICD-10-CM

## 2018-04-24 DIAGNOSIS — I249 Acute ischemic heart disease, unspecified: Secondary | ICD-10-CM | POA: Diagnosis present

## 2018-04-24 DIAGNOSIS — R079 Chest pain, unspecified: Secondary | ICD-10-CM | POA: Diagnosis not present

## 2018-04-24 DIAGNOSIS — Z888 Allergy status to other drugs, medicaments and biological substances status: Secondary | ICD-10-CM

## 2018-04-24 DIAGNOSIS — Z981 Arthrodesis status: Secondary | ICD-10-CM

## 2018-04-24 DIAGNOSIS — I248 Other forms of acute ischemic heart disease: Secondary | ICD-10-CM | POA: Diagnosis not present

## 2018-04-24 DIAGNOSIS — E669 Obesity, unspecified: Secondary | ICD-10-CM

## 2018-04-24 LAB — CBC
HEMATOCRIT: 40.4 % (ref 36.0–46.0)
HEMOGLOBIN: 12.6 g/dL (ref 12.0–15.0)
MCH: 22.4 pg — AB (ref 26.0–34.0)
MCHC: 31.2 g/dL (ref 30.0–36.0)
MCV: 71.9 fL — ABNORMAL LOW (ref 78.0–100.0)
Platelets: 384 10*3/uL (ref 150–400)
RBC: 5.62 MIL/uL — AB (ref 3.87–5.11)
RDW: 15.1 % (ref 11.5–15.5)
WBC: 6.6 10*3/uL (ref 4.0–10.5)

## 2018-04-24 LAB — BASIC METABOLIC PANEL
Anion gap: 8 (ref 5–15)
BUN: 11 mg/dL (ref 6–20)
CALCIUM: 9.2 mg/dL (ref 8.9–10.3)
CHLORIDE: 101 mmol/L (ref 98–111)
CO2: 31 mmol/L (ref 22–32)
CREATININE: 0.64 mg/dL (ref 0.44–1.00)
GFR calc non Af Amer: >60 mL/min (ref >60)
Glucose, Bld: 189 mg/dL — ABNORMAL HIGH (ref 70–99)
Potassium: 4.1 mmol/L (ref 3.5–5.1)
SODIUM: 140 mmol/L (ref 135–145)

## 2018-04-24 LAB — HCG, QUANTITATIVE, PREGNANCY: hCG, Beta Chain, Quant, S: 1 m[IU]/mL (ref <5)

## 2018-04-24 LAB — GLUCOSE, CAPILLARY: GLUCOSE-CAPILLARY: 131 mg/dL — AB (ref 70–99)

## 2018-04-24 LAB — HEPARIN LEVEL (UNFRACTIONATED): Heparin Unfractionated: 0.27 IU/mL — ABNORMAL LOW (ref 0.30–0.70)

## 2018-04-24 LAB — TROPONIN I
Troponin I: 0.03 ng/mL (ref <0.03)
Troponin I: 0.32 ng/mL (ref <0.03)

## 2018-04-24 MED ORDER — SODIUM CHLORIDE 0.9 % WEIGHT BASED INFUSION: 1.0000 mL/kg/h | INTRAVENOUS | Status: DC

## 2018-04-24 MED ORDER — LOSARTAN POTASSIUM 50 MG PO TABS
100.0000 mg | ORAL_TABLET | Freq: Every day | ORAL | Status: DC
  Administered 2018-04-24 – 2018-04-26 (×3): 100 mg via ORAL
  Filled 2018-04-24 (×3): qty 2

## 2018-04-24 MED ORDER — FERROUS SULFATE 325 (65 FE) MG PO TABS
325.0000 mg | ORAL_TABLET | Freq: Two times a day (BID) | ORAL | Status: DC
  Administered 2018-04-24 – 2018-04-26 (×4): 325 mg via ORAL
  Filled 2018-04-24 (×4): qty 1

## 2018-04-24 MED ORDER — ASPIRIN EC 81 MG PO TBEC
81.0000 mg | DELAYED_RELEASE_TABLET | Freq: Every day | ORAL | Status: DC
  Administered 2018-04-25 – 2018-04-26 (×2): 81 mg via ORAL
  Filled 2018-04-24 (×2): qty 1

## 2018-04-24 MED ORDER — ASPIRIN 81 MG PO CHEW
81.0000 mg | CHEWABLE_TABLET | ORAL | Status: AC
  Administered 2018-04-25: 81 mg via ORAL
  Filled 2018-04-24: qty 1

## 2018-04-24 MED ORDER — SODIUM CHLORIDE 0.9 % IV SOLN: 250.0000 mL | INTRAVENOUS | Status: DC | PRN

## 2018-04-24 MED ORDER — ASPIRIN 81 MG PO CHEW
324.0000 mg | CHEWABLE_TABLET | Freq: Once | ORAL | Status: AC
  Administered 2018-04-24: 324 mg via ORAL
  Filled 2018-04-24: qty 4

## 2018-04-24 MED ORDER — SODIUM CHLORIDE 0.9 % WEIGHT BASED INFUSION
1.0000 mL/kg/h | INTRAVENOUS | Status: DC
  Administered 2018-04-25: 1 mL/kg/h via INTRAVENOUS

## 2018-04-24 MED ORDER — VITAMIN D (ERGOCALCIFEROL) 1.25 MG (50000 UNIT) PO CAPS
50000.0000 [IU] | ORAL_CAPSULE | ORAL | Status: DC
  Administered 2018-04-24: 50000 [IU] via ORAL
  Filled 2018-04-24: qty 1

## 2018-04-24 MED ORDER — SODIUM CHLORIDE 0.9% FLUSH: 3.0000 mL | Freq: Two times a day (BID) | INTRAVENOUS | Status: DC

## 2018-04-24 MED ORDER — SPIRONOLACTONE 25 MG PO TABS
25.0000 mg | ORAL_TABLET | Freq: Every day | ORAL | Status: DC
  Administered 2018-04-24 – 2018-04-26 (×3): 25 mg via ORAL
  Filled 2018-04-24 (×3): qty 1

## 2018-04-24 MED ORDER — LINAGLIPTIN 5 MG PO TABS
5.0000 mg | ORAL_TABLET | Freq: Every day | ORAL | Status: DC
  Administered 2018-04-24 – 2018-04-26 (×2): 5 mg via ORAL
  Filled 2018-04-24 (×3): qty 1

## 2018-04-24 MED ORDER — HEPARIN (PORCINE) IN NACL 100-0.45 UNIT/ML-% IJ SOLN
1500.0000 [IU]/h | INTRAMUSCULAR | Status: DC
  Administered 2018-04-24: 1300 [IU]/h via INTRAVENOUS
  Filled 2018-04-24 (×2): qty 250

## 2018-04-24 MED ORDER — INSULIN ASPART 100 UNIT/ML ~~LOC~~ SOLN
0.0000 [IU] | Freq: Three times a day (TID) | SUBCUTANEOUS | Status: DC
  Administered 2018-04-25: 3 [IU] via SUBCUTANEOUS
  Administered 2018-04-25: 2 [IU] via SUBCUTANEOUS
  Administered 2018-04-25 – 2018-04-26 (×2): 3 [IU] via SUBCUTANEOUS

## 2018-04-24 MED ORDER — SODIUM CHLORIDE 0.9% FLUSH: 3.0000 mL | INTRAVENOUS | Status: DC | PRN

## 2018-04-24 MED ORDER — ONDANSETRON HCL 4 MG/2ML IJ SOLN
4.0000 mg | Freq: Four times a day (QID) | INTRAMUSCULAR | Status: DC | PRN
  Administered 2018-04-25: 4 mg via INTRAVENOUS
  Filled 2018-04-24: qty 2

## 2018-04-24 MED ORDER — HEPARIN BOLUS VIA INFUSION
4000.0000 [IU] | Freq: Once | INTRAVENOUS | Status: AC
  Administered 2018-04-24: 4000 [IU] via INTRAVENOUS
  Filled 2018-04-24: qty 4000

## 2018-04-24 MED ORDER — GLIPIZIDE 5 MG PO TABS
5.0000 mg | ORAL_TABLET | Freq: Two times a day (BID) | ORAL | Status: DC
  Administered 2018-04-24 – 2018-04-26 (×4): 5 mg via ORAL
  Filled 2018-04-24 (×4): qty 1

## 2018-04-24 MED ORDER — HEPARIN SODIUM (PORCINE) 5000 UNIT/ML IJ SOLN
4000.0000 [IU] | Freq: Once | INTRAMUSCULAR | Status: DC
  Filled 2018-04-24: qty 0.8

## 2018-04-24 MED ORDER — ASPIRIN 81 MG PO CHEW: 81.0000 mg | CHEWABLE_TABLET | ORAL | Status: DC

## 2018-04-24 MED ORDER — NITROGLYCERIN 0.4 MG SL SUBL: 0.4000 mg | SUBLINGUAL_TABLET | SUBLINGUAL | Status: DC | PRN

## 2018-04-24 MED ORDER — ACETAMINOPHEN 325 MG PO TABS: 650.0000 mg | ORAL_TABLET | ORAL | Status: DC | PRN

## 2018-04-24 MED ORDER — HYDROCHLOROTHIAZIDE 25 MG PO TABS
25.0000 mg | ORAL_TABLET | Freq: Every day | ORAL | Status: DC
  Administered 2018-04-24 – 2018-04-26 (×3): 25 mg via ORAL
  Filled 2018-04-24 (×3): qty 1

## 2018-04-24 MED ORDER — CARVEDILOL 6.25 MG PO TABS
6.2500 mg | ORAL_TABLET | Freq: Two times a day (BID) | ORAL | Status: DC
  Administered 2018-04-24 – 2018-04-26 (×4): 6.25 mg via ORAL
  Filled 2018-04-24 (×4): qty 1

## 2018-04-24 MED ORDER — SODIUM CHLORIDE 0.9 % WEIGHT BASED INFUSION
3.0000 mL/kg/h | INTRAVENOUS | Status: DC
  Administered 2018-04-25: 3 mL/kg/h via INTRAVENOUS

## 2018-04-24 MED ORDER — AMLODIPINE BESYLATE 10 MG PO TABS
10.0000 mg | ORAL_TABLET | Freq: Every day | ORAL | Status: DC
  Administered 2018-04-24 – 2018-04-26 (×3): 10 mg via ORAL
  Filled 2018-04-24 (×3): qty 1

## 2018-04-24 MED ORDER — SODIUM CHLORIDE 0.9 % WEIGHT BASED INFUSION: 3.0000 mL/kg/h | INTRAVENOUS | Status: DC

## 2018-04-24 NOTE — ED Provider Notes (Addendum)
[facility] Emergency Department Provider Note MRN:  [id number]  [place] date & time: 04/24/18     Chief Complaint   Chest Pain   History of Present Illness   Claudia [name] is a 52 y.o. year-old female with a history of hypertension diabetes presenting to the ED with chief complaint of chest pain.  The pain woke her from sleep at 6 AM this morning.  The pain is located in the left side of the chest, occasionally radiating to the left arm.  The pain is described as a dull pressure.  Was mild to moderate in severity, now resolved while sitting in the ED.  The pain was not worse with deep breathing.  Denies recent fever cough.  Mild shortness of breath with the pain.  Denies dizziness or sweatiness, no emesis.  Review of Systems  A complete 10 system review of systems was obtained and all systems are negative except as noted in the HPI and PMH.   Patient's Health History    Past Medical History:  Diagnosis Date  . Diabetes mellitus   . Fibroids   . Headache(784.0)   . History of echocardiogram    Echo 7/18: Moderate concentric LVH, EF 55-60, normal wall motion, grade 1 diastolic dysfunction, calcified aortic leaflets  . Hypercholesterolemia   . Hyperlipidemia   . Hypertension   . Osteoarthritis    , with mild meniscus tear, followed by orthopedics--Dr Gladstone Lighter 04/29/10  . Right knee pain   . Seizures ([place])    as child  . Vitamin D deficiency     Past Surgical History:  Procedure Laterality Date  . ANTERIOR CERVICAL DECOMP/DISCECTOMY FUSION N/A 09/07/2013   Procedure: ANTERIOR CERVICAL DECOMPRESSION/DISCECTOMY FUSION 1 LEVEL;  Surgeon: Sinclair Ship, MD;  Location: [place];  Service: Orthopedics;  Laterality: N/A;  Anterior cervical decompression fusion, cervical 5-6 with instrumentation and allograft  . LAPAROSCOPIC ASSISTED VAGINAL HYSTERECTOMY  1/14  . SHOULDER SURGERY Right 11   rotator cuff  . TUBAL LIGATION    . UMBILICAL HERNIA REPAIR      Family  History  Problem Relation Age of Onset  . Hypertension Mother   . Diabetes Mother   . Hypercholesterolemia Mother   . Hypertension Sister   . CVA Maternal Grandmother   . Hypertension Maternal Grandmother   . Diabetes Maternal Grandmother   . Heart attack Neg Hx     Social History   Socioeconomic History  . Marital status: Married    Spouse name: Not on file  . Number of children: Not on file  . Years of education: Not on file  . Highest education level: Not on file  Occupational History  . Not on file  Social Needs  . Financial resource strain: Not on file  . Food insecurity:    Worry: Not on file    Inability: Not on file  . Transportation needs:    Medical: Not on file    Non-medical: Not on file  Tobacco Use  . Smoking status: Never Smoker  . Smokeless tobacco: Never Used  Substance and Sexual Activity  . Alcohol use: No  . Drug use: No  . Sexual activity: Not on file  Lifestyle  . Physical activity:    Days per week: Not on file    Minutes per session: Not on file  . Stress: Not on file  Relationships  . Social connections:    Talks on phone: Not on file    Gets together: Not  on file    Attends religious service: Not on file    Active member of club or organization: Not on file    Attends meetings of clubs or organizations: Not on file    Relationship status: Not on file  . Intimate partner violence:    Fear of current or ex partner: Not on file    Emotionally abused: Not on file    Physically abused: Not on file    Forced sexual activity: Not on file  Other Topics Concern  . Not on file  Social History Narrative  . Not on file     Physical Exam  Vital Signs and Nursing Notes reviewed Vitals:   04/24/18 1201 04/24/18 1537  BP: (!) 158/104 (!) 158/101  Pulse: 85 87  Resp: 18 18  Temp: 98.8 F (37.1 C) 98 F (36.7 C)  SpO2: 100% 100%    CONSTITUTIONAL: Well-appearing, NAD NEURO:  Alert and oriented x 3, no focal deficits EYES:  eyes equal  and reactive ENT/NECK:  no LAD, no JVD CARDIO: Regular rate, well-perfused, normal S1 and S2; chest nontender to palpation PULM:  CTAB no wheezing or rhonchi GI/GU:  normal bowel sounds, non-distended, non-tender MSK/SPINE:  No gross deformities, no edema SKIN:  no rash, atraumatic PSYCH:  Appropriate speech and behavior  Diagnostic and Interventional Summary    EKG Interpretation  Date/Time:  Sunday April 24 2018 07:11:59 EDT Ventricular Rate:  74 PR Interval:    QRS Duration: 102 QT Interval:  412 QTC Calculation: 458 R Axis:   97 Text Interpretation:  Sinus rhythm Borderline right axis deviation Low voltage, precordial leads unchanged from prior Confirmed by Gerlene Fee [phone]) on 04/24/2018 7:56:35 AM Also confirmed by Gerlene Fee [phone]), editor Shon Hale [phone])  on 04/24/2018 8:56:07 AM      Labs Reviewed  BASIC METABOLIC PANEL - Abnormal; Notable for the following components:      Result Value   Glucose, Bld 189 (*)    All other components within normal limits  CBC - Abnormal; Notable for the following components:   RBC 5.62 (*)    MCV 71.9 (*)    MCH 22.4 (*)    All other components within normal limits  TROPONIN I - Abnormal; Notable for the following components:   Troponin I 0.03 (*)    All other components within normal limits  TROPONIN I - Abnormal; Notable for the following components:   Troponin I 0.32 (*)    All other components within normal limits  HCG, QUANTITATIVE, PREGNANCY    DG Chest 2 View  Final Result      Medications  heparin injection 4,000 Units (has no administration in time range)  aspirin chewable tablet 324 mg (324 mg Oral Given 04/24/18 0926)     Procedures Critical Care Critical Care Documentation Critical care time provided by me (excluding procedures): 35 minutes  Condition necessitating critical care: acute coronary syndrome  Components of critical care management: reviewing of prior records, laboratory and imaging  interpretation, frequent re-examination and reassessment of vital signs, administration of IV heparin, discussion with consultants.    ED Course and Medical Decision Making  I have reviewed the triage vital signs and the nursing notes.  Pertinent labs & imaging results that were available during my care of the patient were reviewed by me and considered in my medical decision making (see below for details). Clinical Course as of Apr 25 1555  Sun Apr 24, 2746  6489 52 year old female with  a history of hypertension, diabetes, and patient also reports what sounds like cardiomyopathy for which she takes medication presenting with chest pain.  Pain woke her from rest, left-sided, pressure-like pain rating to the left arm.  Not tachycardic, no leg swelling or pain, no birth control pills, little to no concern for PE at this time.  Given her risk factors, will obtain 2 troponins to evaluate for cardiac etiology.  EKG with no ischemic changes.   [MB]  9924 Small delay in obtaining second troponin due to laboratory error.  Patient's pain well controlled during this time.  Repeat troponin 0.32, concerning for an NSTEMI.  Will consult cardiology and admit to either cardiology or hospitalist service.   [MB]    Clinical Course User Index [MB] Sedonia Small Barth Kirks, MD    Spoke with Dr. Radford Pax, cardiology, will admit at [facility], telemetry bed requested.  Heparin bolus ordered.  Barth Kirks. Sedonia Small, [place] [email or web address]  Final Clinical Impressions(s) / ED Diagnoses     ICD-10-CM   1. NSTEMI (non-ST elevated myocardial infarction) [facility]) I21.4     ED Discharge Orders    None         Maudie Flakes, MD 04/24/18 1557    Maudie Flakes, MD 05/05/18 1335

## 2018-04-24 NOTE — ED Triage Notes (Signed)
She states that as she was relaxed and holding her grandchild she experienced left chest pain, which persists. She is in no distress. EKG performed at triage. She states she has seen cardiology in the past for "enlarged heart".

## 2018-04-24 NOTE — ED Notes (Signed)
ED TO INPATIENT HANDOFF REPORT  Name/Age/Gender [name] 52 y.o. female  Code Status Code Status History    Date Active Date Inactive Code Status Order ID Comments User Context   09/07/2013 1651 09/08/2013 1704 Full Code [id number]  Manfred Arch Inpatient   06/01/2012 2124 06/02/2012 1848 Full Code [id number]  Earma Reading, RN Inpatient      Home/SNF/Other Home  Chief Complaint Chest Pain  Level of Care/Admitting Diagnosis ED Disposition    ED Disposition Condition [place] [facility] Area: [place] [100100]  Level of Care: Telemetry [5]  Diagnosis: ACS (acute coronary syndrome) [facility]) [[id number]]  Admitting Physician: Constance Haw [phone]  Attending Physician: Constance Haw [phone]  Estimated length of stay: past midnight tomorrow  Certification:: I certify this patient will need inpatient services for at least 2 midnights  PT Class (Do Not Modify): Inpatient [101]  PT Acc Code (Do Not Modify): Private [1]       Medical History Past Medical History:  Diagnosis Date  . Diabetes mellitus   . Fibroids   . Headache(784.0)   . History of echocardiogram    Echo 7/18: Moderate concentric LVH, EF 55-60, normal wall motion, grade 1 diastolic dysfunction, calcified aortic leaflets  . Hypercholesterolemia   . Hyperlipidemia   . Hypertension   . Osteoarthritis    , with mild meniscus tear, followed by orthopedics--Dr Gladstone Lighter 04/29/10  . Right knee pain   . Seizures ([place])    as child  . Vitamin D deficiency     Allergies Allergies  Allergen Reactions  . Crestor [Rosuvastatin]     MUSCLE ACHES HEADACHES   . Hydrocodone Itching  . Other Hives, Swelling and Other (See Comments)    Vision changes; ALLERGIC TO ALL NUTS EXCEPT PEANUTS  . Oxycodone Itching  . Pravachol [Pravastatin Sodium] Itching    Joint pain  . Simvastatin Hives    IV Location/Drains/Wounds Patient Lines/Drains/Airways Status   Active  Line/Drains/Airways    Name:   Placement date:   Placement time:   Site:   Days:   Peripheral IV 04/24/18 Left Forearm   04/24/18    0810    Forearm   less than 1   Incision 09/07/13 Neck Other (Comment)   09/07/13    1429     1690          Labs/Imaging Results for orders placed or performed during the hospital encounter of 04/24/18 (from the past 48 hour(s))  Basic metabolic panel     Status: Abnormal   Collection Time: 04/24/18  8:10 AM  Result Value Ref Range   Sodium 140 135 - 145 mmol/L   Potassium 4.1 3.5 - 5.1 mmol/L   Chloride 101 98 - 111 mmol/L   CO2 31 22 - 32 mmol/L   Glucose, Bld 189 (H) 70 - 99 mg/dL   BUN 11 6 - 20 mg/dL   Creatinine, Ser 0.64 0.44 - 1.00 mg/dL   Calcium 9.2 8.9 - 10.3 mg/dL   GFR calc non Af Amer >60 >60 mL/min   GFR calc Af Amer >60 >60 mL/min    Comment: (NOTE) The eGFR has been calculated using the CKD EPI equation. This calculation has not been validated in all clinical situations. eGFR's persistently <60 mL/min signify possible Chronic Kidney Disease.    Anion gap 8 5 - 15    Comment: Performed at [facility], [place] [address], [place], [place] [address]  CBC     Status: Abnormal   Collection Time: 04/24/18  8:10 AM  Result Value Ref Range   WBC 6.6 4.0 - 10.5 K/uL   RBC 5.62 (H) 3.87 - 5.11 MIL/uL   Hemoglobin 12.6 12.0 - 15.0 g/dL   HCT 40.4 36.0 - 46.0 %   MCV 71.9 (L) 78.0 - 100.0 fL   MCH 22.4 (L) 26.0 - 34.0 pg   MCHC 31.2 30.0 - 36.0 g/dL   RDW 15.1 11.5 - 15.5 %   Platelets 384 150 - 400 K/uL    Comment: Performed at [facility], [place] [address], [place], [place] [address]  Troponin I     Status: Abnormal   Collection Time: 04/24/18  8:10 AM  Result Value Ref Range   Troponin I 0.03 (HH) <0.03 ng/mL    Comment: CRITICAL RESULT CALLED TO, READ BACK BY AND VERIFIED WITH: P DOWD,RN 04/24/18 4401 RHOLMES Performed at [facility], [place] [address], [place], [place]  [address]   hCG, quantitative, pregnancy     Status: None   Collection Time: 04/24/18  8:10 AM  Result Value Ref Range   hCG, Beta Chain, Quant, S 1 <5 mIU/mL    Comment:          GEST. AGE      CONC.  (mIU/mL)   <=1 WEEK        5 - 50     2 WEEKS       50 - 500     3 WEEKS       100 - 10,000     4 WEEKS     1,000 - 30,000     5 WEEKS     3,500 - 115,000   6-8 WEEKS     12,000 - 270,000    12 WEEKS     15,000 - 220,000        FEMALE AND NON-PREGNANT FEMALE:     LESS THAN 5 mIU/mL Performed at [facility], [place] [address] [place], [place] [address]   Troponin I     Status: Abnormal   Collection Time: 04/24/18  1:48 PM  Result Value Ref Range   Troponin I 0.32 (HH) <0.03 ng/mL    Comment: CRITICAL RESULT CALLED TO, READ BACK BY AND VERIFIED WITH: DR.BERO AT 1508 ON 04/24/18 BY S.VANHOORNE Performed at [facility], [place] [address], [place], [place] [address]    Dg Chest 2 View  Result Date: 04/24/2018 CLINICAL DATA:  Chest pain EXAM: CHEST - 2 VIEW COMPARISON:  03/01/2017 FINDINGS: The heart size and mediastinal contours are within normal limits. Both lungs are clear. The visualized skeletal structures are unremarkable. IMPRESSION: No active cardiopulmonary disease. Electronically Signed   By: Inez Catalina M.D.   On: 04/24/2018 08:46    Pending Labs Unresulted Labs (From admission, onward)   Start     Ordered   04/25/18 0500  CBC  Daily,   R     04/24/18 1602   04/24/18 2230  Heparin level (unfractionated)  Once-Timed,   R     04/24/18 1602   Signed and Held  HIV antibody (Routine Testing)  Once,   R     Signed and Held   Signed and Held  Basic metabolic panel  Tomorrow morning,   R     Signed and Held      Vitals/Pain Today's Vitals   04/24/18 [phone] 04/24/18 6387 04/24/18 1201  04/24/18 1537  BP: (!) 160/108 (!) 160/108 (!) 158/104 (!) 158/101  Pulse: 87 86 85 87  Resp:  15 18 18   Temp: 98.6 F (37 C) 98.6 F (37 C) 98.8 F (37.1 C)  98 F (36.7 C)  TempSrc: Oral Oral Oral Oral  SpO2: 100% 100% 100% 100%  Weight:      Height:      PainSc:        Isolation Precautions No active isolations  Medications Medications  heparin ADULT infusion 100 units/mL (25000 units/257m sodium chloride 0.45%) (1,300 Units/hr Intravenous New Bag/Given 04/24/18 1624)  aspirin chewable tablet 324 mg (324 mg Oral Given 04/24/18 0926)  heparin bolus via infusion 4,000 Units (4,000 Units Intravenous Bolus from Bag 04/24/18 1624)    Mobility walks

## 2018-04-24 NOTE — Progress Notes (Signed)
[place] for heparin Indication: chest pain/ACS  Allergies  Allergen Reactions  . Crestor [Rosuvastatin]     MUSCLE ACHES HEADACHES   . Hydrocodone Itching  . Other Hives, Swelling and Other (See Comments)    Vision changes; ALLERGIC TO ALL NUTS EXCEPT PEANUTS  . Oxycodone Itching  . Pravachol [Pravastatin Sodium] Itching    Joint pain  . Simvastatin Hives    Patient Measurements: Height: 5\' 8"  (172.7 cm) Weight: (!) 303 lb (137.4 kg) IBW/kg (Calculated) : 63.9 Heparin Dosing Weight: 97 kg  Vital Signs: Temp: 98 F (36.7 C) (07/28 1537) Temp Source: Oral (07/28 1537) BP: 158/101 (07/28 1537) Pulse Rate: 87 (07/28 1537)  Labs: Recent Labs    04/24/18 0810 04/24/18 1348  HGB 12.6  --   HCT 40.4  --   PLT 384  --   CREATININE 0.64  --   TROPONINI 0.03* 0.32*    Estimated Creatinine Clearance: 123.9 mL/min (by C-G formula based on SCr of 0.64 mg/dL).   Assessment: Patient's a 52 y.o F presented to the ED on 7/28 with c/o CP.  Troponin was found to be elevated.  To start heparin drip for ACS.   Goal of Therapy:  Heparin level 0.3-0.7 units/ml Monitor platelets by anticoagulation protocol: Yes   Plan:  - heparin 4000 units IV x1 bolus, then drip at 1300 units/hr - check 6 hr heparin level - monitor for s/s bleeding   Anaja Monts P 04/24/2018,3:57 PM

## 2018-04-24 NOTE — H&P (Signed)
Cardiology Consultation:   Patient ID: Claudia [name]; [id number]; [birth year]   Admit date: 04/24/2018 Date of Consult: 04/24/2018  Primary Care Provider: Scot Jun, FNP Primary Cardiologist: Marisa Cyphers Primary Electrophysiologist:  none   Patient Profile:   Claudia [name] is a 52 y.o. female with a hx of pretension, diabetes, hyperlipidemia, nonischemic cardiomyopathy with an EF of 40 to 45% that has since resolved who is being seen today for the evaluation of chest pain at the request of Gerlene Fee.  History of Present Illness:   Ms. Bias she was doing well until 6 AM this morning.  She had pain in the left side of her chest occasionally radiated to her left arm.  The pain was described as a dull pressure moderate in severity.  The pain was resolved once she was in the emergency room.  The pain is not changed by breathing maneuvers.  The pain was associated with shortness of breath.  She had trouble catching her breath and carrying on a conversation.  Was also associated with nausea.  There are no exacerbating or alleviating factors.  She has never had a pain like this in the past.  She does have a history of hypertension, but she has not taken her medications today.  Her blood pressure is mild to moderately elevated.   Past Medical History:  Diagnosis Date  . Diabetes mellitus   . Fibroids   . Headache(784.0)   . History of echocardiogram    Echo 7/18: Moderate concentric LVH, EF 55-60, normal wall motion, grade 1 diastolic dysfunction, calcified aortic leaflets  . Hypercholesterolemia   . Hyperlipidemia   . Hypertension   . Osteoarthritis    , with mild meniscus tear, followed by orthopedics--Dr Gladstone Lighter 04/29/10  . Right knee pain   . Seizures ([place])    as child  . Vitamin D deficiency     Past Surgical History:  Procedure Laterality Date  . ANTERIOR CERVICAL DECOMP/DISCECTOMY FUSION N/A 09/07/2013   Procedure: ANTERIOR CERVICAL DECOMPRESSION/DISCECTOMY FUSION  1 LEVEL;  Surgeon: Sinclair Ship, MD;  Location: [place];  Service: Orthopedics;  Laterality: N/A;  Anterior cervical decompression fusion, cervical 5-6 with instrumentation and allograft  . LAPAROSCOPIC ASSISTED VAGINAL HYSTERECTOMY  1/14  . SHOULDER SURGERY Right 11   rotator cuff  . TUBAL LIGATION    . UMBILICAL HERNIA REPAIR       Home Medications:  Prior to Admission medications   Medication Sig Start Date End Date Taking? Authorizing Provider  amLODipine (NORVASC) 10 MG tablet Take 0.5 tablets (5 mg total) by mouth daily. Patient taking differently: Take 10 mg by mouth daily.  12/22/17  Yes Burtis Junes, NP  carvedilol (COREG) 6.25 MG tablet Take 1 tablet (6.25 mg total) by mouth 2 (two) times daily. 12/22/17  Yes Burtis Junes, NP  ferrous sulfate 325 (65 FE) MG tablet Take 325 mg by mouth 2 (two) times daily.   Yes [provider]  glipiZIDE (GLUCOTROL) 5 MG tablet TAKE 1 TABLET BY MOUTH TWICE DAILY BEFORE  A  MEAL 07/07/17  Yes Scot Jun, FNP  hydrochlorothiazide (HYDRODIURIL) 25 MG tablet Take 1 tablet (25 mg total) by mouth daily. 07/07/17  Yes Scot Jun, FNP  losartan (COZAAR) 100 MG tablet Take 1 tablet (100 mg total) by mouth daily. 07/07/17  Yes Scot Jun, FNP  metFORMIN (GLUCOPHAGE) 1000 MG tablet Take 1 tablet (1,000 mg total) by mouth 2 (two) times daily with a meal. 07/07/17  Yes Scot Jun, FNP  sitaGLIPtin (JANUVIA) 25 MG tablet Take 1 tablet (25 mg total) by mouth daily. 10/13/17  Yes Scot Jun, FNP  spironolactone (ALDACTONE) 25 MG tablet Take 1 tablet (25 mg total) by mouth daily. 07/07/17  Yes Scot Jun, FNP  Vitamin D, Ergocalciferol, (DRISDOL) 50000 units CAPS capsule Take 1 capsule (50,000 Units total) by mouth every 7 (seven) days. 11/22/17  Yes Scot Jun, FNP    Inpatient Medications: Scheduled Meds: . heparin  4,000 Units Intravenous Once   Continuous Infusions: . heparin      PRN Meds:   Allergies:    Allergies  Allergen Reactions  . Crestor [Rosuvastatin]     MUSCLE ACHES HEADACHES   . Hydrocodone Itching  . Other Hives, Swelling and Other (See Comments)    Vision changes; ALLERGIC TO ALL NUTS EXCEPT PEANUTS  . Oxycodone Itching  . Pravachol [Pravastatin Sodium] Itching    Joint pain  . Simvastatin Hives    Social History:   Social History   Socioeconomic History  . Marital status: Married    Spouse name: Not on file  . Number of children: Not on file  . Years of education: Not on file  . Highest education level: Not on file  Occupational History  . Not on file  Social Needs  . Financial resource strain: Not on file  . Food insecurity:    Worry: Not on file    Inability: Not on file  . Transportation needs:    Medical: Not on file    Non-medical: Not on file  Tobacco Use  . Smoking status: Never Smoker  . Smokeless tobacco: Never Used  Substance and Sexual Activity  . Alcohol use: No  . Drug use: No  . Sexual activity: Not on file  Lifestyle  . Physical activity:    Days per week: Not on file    Minutes per session: Not on file  . Stress: Not on file  Relationships  . Social connections:    Talks on phone: Not on file    Gets together: Not on file    Attends religious service: Not on file    Active member of club or organization: Not on file    Attends meetings of clubs or organizations: Not on file    Relationship status: Not on file  . Intimate partner violence:    Fear of current or ex partner: Not on file    Emotionally abused: Not on file    Physically abused: Not on file    Forced sexual activity: Not on file  Other Topics Concern  . Not on file  Social History Narrative  . Not on file    Family History:    Family History  Problem Relation Age of Onset  . Hypertension Mother   . Diabetes Mother   . Hypercholesterolemia Mother   . Hypertension Sister   . CVA Maternal Grandmother   . Hypertension  Maternal Grandmother   . Diabetes Maternal Grandmother   . Heart attack Neg Hx      ROS:  Please see the history of present illness.   All other ROS reviewed and negative.     Physical Exam/Data:   Vitals:   04/24/18 0927 04/24/18 0928 04/24/18 1201 04/24/18 1537  BP: (!) 160/108 (!) 160/108 (!) 158/104 (!) 158/101  Pulse: 87 86 85 87  Resp:  15 18 18   Temp: 98.6 F (37 C) 98.6 F (37 C) 98.8  F (37.1 C) 98 F (36.7 C)  TempSrc: Oral Oral Oral Oral  SpO2: 100% 100% 100% 100%  Weight:      Height:       No intake or output data in the 24 hours ending 04/24/18 1621 Filed Weights   04/24/18 0707  Weight: (!) 303 lb (137.4 kg)   Body mass index is 46.07 kg/m.  General:  Well nourished, well developed, in no acute distress, obese HEENT: normal Lymph: no adenopathy Neck: no JVD Endocrine:  No thryomegaly Vascular: No carotid bruits; FA pulses 2+ bilaterally without bruits  Cardiac:  normal S1, S2; RRR; no murmur  Lungs:  clear to auscultation bilaterally, no wheezing, rhonchi or rales  Abd: soft, nontender, no hepatomegaly  Ext: no edema Musculoskeletal:  No deformities, BUE and BLE strength normal and equal Skin: warm and dry  Neuro:  CNs 2-12 intact, no focal abnormalities noted Psych:  Normal affect   EKG:  The EKG was personally reviewed and demonstrates: Sinus rhythm, T wave inversion in aVL Telemetry:  Telemetry was personally reviewed and demonstrates: Sinus rhythm  Relevant CV Studies: TTE 04/20/17 - Left ventricle: The cavity size was normal. There was moderate   concentric hypertrophy. Systolic function was normal. The   estimated ejection fraction was in the range of 55% to 60%. Wall   motion was normal; there were no regional wall motion   abnormalities. There was an increased relative contribution of   atrial contraction to ventricular filling. Doppler parameters are   consistent with abnormal left ventricular relaxation (grade 1   diastolic  dysfunction). - Aortic valve: Trileaflet; mildly calcified leaflets.  Laboratory Data:  Chemistry Recent Labs  Lab 04/24/18 0810  NA 140  K 4.1  CL 101  CO2 31  GLUCOSE 189*  BUN 11  CREATININE 0.64  CALCIUM 9.2  GFRNONAA >60  GFRAA >60  ANIONGAP 8    No results for input(s): PROT, ALBUMIN, AST, ALT, ALKPHOS, BILITOT in the last 168 hours. Hematology Recent Labs  Lab 04/24/18 0810  WBC 6.6  RBC 5.62*  HGB 12.6  HCT 40.4  MCV 71.9*  MCH 22.4*  MCHC 31.2  RDW 15.1  PLT 384   Cardiac Enzymes Recent Labs  Lab 04/24/18 0810 04/24/18 1348  TROPONINI 0.03* 0.32*   No results for input(s): TROPIPOC in the last 168 hours.  BNPNo results for input(s): BNP, PROBNP in the last 168 hours.  DDimer No results for input(s): DDIMER in the last 168 hours.  Radiology/Studies:  Dg Chest 2 View  Result Date: 04/24/2018 CLINICAL DATA:  Chest pain EXAM: CHEST - 2 VIEW COMPARISON:  03/01/2017 FINDINGS: The heart size and mediastinal contours are within normal limits. Both lungs are clear. The visualized skeletal structures are unremarkable. IMPRESSION: No active cardiopulmonary disease. Electronically Signed   By: Inez Catalina M.D.   On: 04/24/2018 08:46    Assessment and Plan:   1. Non-ST elevation MI: Had chest pain with an elevated troponin of 0.32.  She does have a history of a nonischemic cardiomyopathy, but has not had cardiac catheterization performed.  Her EKG shows sinus rhythm with T wave inversions in aVL.  Due to this, would plan for left heart catheterization. 2. Hypertension: Pressure was elevated on presentation in the emergency room.  Unclear if this is due to her episode of chest pain and anxiety.  We will plan to restart her home medications. 3. Hyperlipidemia: Has been statin intolerant.  May benefit from referral to lipid  clinic for possible [place].   For questions or updates, please contact [place] Please consult [email or web address] for contact info under  Cardiology/STEMI.   Signed, Will Meredith Leeds, MD  04/24/2018 4:21 PM

## 2018-04-24 NOTE — Progress Notes (Signed)
Daily meds given due to patient did not have any of her medications today

## 2018-04-24 NOTE — Progress Notes (Signed)
[facility] for heparin Indication: chest pain/ACS  Allergies  Allergen Reactions  . Crestor [Rosuvastatin]     MUSCLE ACHES HEADACHES   . Hydrocodone Itching  . Other Hives, Swelling and Other (See Comments)    Vision changes; ALLERGIC TO ALL NUTS EXCEPT PEANUTS  . Oxycodone Itching  . Pravachol [Pravastatin Sodium] Itching    Joint pain  . Simvastatin Hives    Patient Measurements: Height: 5\' 8"  (172.7 cm) Weight: (!) 302 lb 9.6 oz (137.3 kg) IBW/kg (Calculated) : 63.9 Heparin Dosing Weight: 97 kg  Vital Signs: Temp: 98.3 F (36.8 C) (07/28 2107) Temp Source: Oral (07/28 2107) BP: 123/60 (07/28 2107) Pulse Rate: 86 (07/28 2107)  Labs: Recent Labs    04/24/18 0810 04/24/18 1348 04/24/18 2218  HGB 12.6  --   --   HCT 40.4  --   --   PLT 384  --   --   HEPARINUNFRC  --   --  0.27*  CREATININE 0.64  --   --   TROPONINI 0.03* 0.32*  --     Estimated Creatinine Clearance: 123.9 mL/min (by C-G formula based on SCr of 0.64 mg/dL).   Assessment: Patient's a 52 y.o F presented to the ED on 7/28 with c/o CP.  Troponin was found to be elevated.  To start heparin drip for ACS. Initial heparin level 0.27 units/ml   Goal of Therapy:  Heparin level 0.3-0.7 units/ml Monitor platelets by anticoagulation protocol: Yes   Plan:  - Increase heparin 1500 units/hr - check 6 hr heparin level - monitor for s/s bleeding   Rasheedah Reis Poteet 04/24/2018,11:56 PM

## 2018-04-25 ENCOUNTER — Encounter (HOSPITAL_COMMUNITY)
Admission: EM | Disposition: A | Discharge status: Home / Self Care | Payer: Self-pay | Source: Home / Self Care | Attending: Cardiology

## 2018-04-25 ENCOUNTER — Inpatient Hospital Stay (HOSPITAL_COMMUNITY): Payer: Managed Care, Other (non HMO)

## 2018-04-25 DIAGNOSIS — I110 Hypertensive heart disease with heart failure: Secondary | ICD-10-CM

## 2018-04-25 DIAGNOSIS — R079 Chest pain, unspecified: Secondary | ICD-10-CM

## 2018-04-25 DIAGNOSIS — I5020 Unspecified systolic (congestive) heart failure: Secondary | ICD-10-CM

## 2018-04-25 HISTORY — PX: LEFT HEART CATH AND CORONARY ANGIOGRAPHY: CATH118249

## 2018-04-25 LAB — ECHOCARDIOGRAM COMPLETE
HEIGHTINCHES: 68 in
WEIGHTICAEL: 4782.4 [oz_av]

## 2018-04-25 LAB — GLUCOSE, CAPILLARY
GLUCOSE-CAPILLARY: 174 mg/dL — AB (ref 70–99)
Glucose-Capillary: 242 mg/dL — ABNORMAL HIGH (ref 70–99)
Glucose-Capillary: 157 mg/dL — ABNORMAL HIGH (ref 70–99)
Glucose-Capillary: 121 mg/dL — ABNORMAL HIGH (ref 70–99)
Glucose-Capillary: 143 mg/dL — ABNORMAL HIGH (ref 70–99)

## 2018-04-25 LAB — HEPARIN LEVEL (UNFRACTIONATED): Heparin Unfractionated: 0.42 IU/mL (ref 0.30–0.70)

## 2018-04-25 LAB — CBC
HEMATOCRIT: 39.4 % (ref 36.0–46.0)
Hemoglobin: 12 g/dL (ref 12.0–15.0)
MCH: 22.2 pg — AB (ref 26.0–34.0)
MCHC: 30.5 g/dL (ref 30.0–36.0)
MCV: 72.8 fL — AB (ref 78.0–100.0)
Platelets: 369 10*3/uL (ref 150–400)
RBC: 5.41 MIL/uL — ABNORMAL HIGH (ref 3.87–5.11)
RDW: 14.8 % (ref 11.5–15.5)
WBC: 6.5 10*3/uL (ref 4.0–10.5)

## 2018-04-25 LAB — BASIC METABOLIC PANEL
ANION GAP: 10 (ref 5–15)
BUN: 9 mg/dL (ref 6–20)
CALCIUM: 9.4 mg/dL (ref 8.9–10.3)
CO2: 31 mmol/L (ref 22–32)
Chloride: 99 mmol/L (ref 98–111)
Creatinine, Ser: 0.62 mg/dL (ref 0.44–1.00)
GFR calc non Af Amer: >60 mL/min (ref >60)
Glucose, Bld: 175 mg/dL — ABNORMAL HIGH (ref 70–99)
POTASSIUM: 4 mmol/L (ref 3.5–5.1)
Sodium: 140 mmol/L (ref 135–145)

## 2018-04-25 LAB — PROTIME-INR
INR: 0.97
Prothrombin Time: 12.8 seconds (ref 11.4–15.2)

## 2018-04-25 LAB — HIV ANTIBODY (ROUTINE TESTING W REFLEX): HIV Screen 4th Generation wRfx: NONREACTIVE

## 2018-04-25 SURGERY — LEFT HEART CATH AND CORONARY ANGIOGRAPHY
Anesthesia: LOCAL

## 2018-04-25 MED ORDER — SODIUM CHLORIDE 0.9 % IV SOLN
INTRAVENOUS | Status: AC
  Administered 2018-04-25: 17:00:00 via INTRAVENOUS

## 2018-04-25 MED ORDER — HYDRALAZINE HCL 25 MG PO TABS
25.0000 mg | ORAL_TABLET | Freq: Three times a day (TID) | ORAL | Status: DC
  Administered 2018-04-25 – 2018-04-26 (×3): 25 mg via ORAL
  Filled 2018-04-25 (×3): qty 1

## 2018-04-25 MED ORDER — MAGNESIUM HYDROXIDE 400 MG/5ML PO SUSP
30.0000 mL | Freq: Every day | ORAL | Status: DC | PRN
  Administered 2018-04-25 – 2018-04-26 (×2): 30 mL via ORAL
  Filled 2018-04-25 (×2): qty 30

## 2018-04-25 MED ORDER — SODIUM CHLORIDE 0.9% FLUSH
3.0000 mL | Freq: Two times a day (BID) | INTRAVENOUS | Status: DC
  Administered 2018-04-26: 3 mL via INTRAVENOUS

## 2018-04-25 MED ORDER — SODIUM CHLORIDE 0.9 % IV SOLN: 250.0000 mL | INTRAVENOUS | Status: DC | PRN

## 2018-04-25 MED ORDER — HEPARIN (PORCINE) IN NACL 1000-0.9 UT/500ML-% IV SOLN
INTRAVENOUS | Status: AC
  Filled 2018-04-25: qty 1000

## 2018-04-25 MED ORDER — ACETAMINOPHEN 325 MG PO TABS: 650.0000 mg | ORAL_TABLET | ORAL | Status: DC | PRN

## 2018-04-25 MED ORDER — SODIUM CHLORIDE 0.9% FLUSH: 3.0000 mL | INTRAVENOUS | Status: DC | PRN

## 2018-04-25 MED ORDER — VERAPAMIL HCL 2.5 MG/ML IV SOLN
INTRAVENOUS | Status: AC
  Filled 2018-04-25: qty 2

## 2018-04-25 MED ORDER — HEPARIN SODIUM (PORCINE) 1000 UNIT/ML IJ SOLN
INTRAMUSCULAR | Status: DC | PRN
  Administered 2018-04-25: 6500 [IU] via INTRAVENOUS

## 2018-04-25 MED ORDER — HEPARIN (PORCINE) IN NACL 1000-0.9 UT/500ML-% IV SOLN
INTRAVENOUS | Status: DC | PRN
  Administered 2018-04-25 (×2): 500 mL

## 2018-04-25 MED ORDER — VERAPAMIL HCL 2.5 MG/ML IV SOLN
INTRAVENOUS | Status: DC | PRN
  Administered 2018-04-25: 5 mL via INTRA_ARTERIAL

## 2018-04-25 MED ORDER — HEPARIN SODIUM (PORCINE) 1000 UNIT/ML IJ SOLN
INTRAMUSCULAR | Status: AC
  Filled 2018-04-25: qty 1

## 2018-04-25 MED ORDER — IOPAMIDOL (ISOVUE-370) INJECTION 76%
INTRAVENOUS | Status: DC | PRN
  Administered 2018-04-25: 40 mL via INTRA_ARTERIAL

## 2018-04-25 MED ORDER — ONDANSETRON HCL 4 MG/2ML IJ SOLN: 4.0000 mg | Freq: Four times a day (QID) | INTRAMUSCULAR | Status: DC | PRN

## 2018-04-25 MED ORDER — ASPIRIN 81 MG PO CHEW: 81.0000 mg | CHEWABLE_TABLET | Freq: Every day | ORAL | Status: DC

## 2018-04-25 MED ORDER — IOPAMIDOL (ISOVUE-370) INJECTION 76%
INTRAVENOUS | Status: AC
  Filled 2018-04-25: qty 100

## 2018-04-25 MED ORDER — LIDOCAINE HCL (PF) 1 % IJ SOLN
INTRAMUSCULAR | Status: DC | PRN
  Administered 2018-04-25: 2 mL via INTRADERMAL

## 2018-04-25 MED ORDER — LIDOCAINE HCL (PF) 1 % IJ SOLN
INTRAMUSCULAR | Status: AC
  Filled 2018-04-25: qty 30

## 2018-04-25 SURGICAL SUPPLY — 13 items
CATH INFINITI 5FR ANG PIGTAIL (CATHETERS) ×2 IMPLANT
CATH INFINITI JR4 5F (CATHETERS) ×2 IMPLANT
CATH OPTITORQUE TIG 4.0 5F (CATHETERS) ×2 IMPLANT
DEVICE RAD COMP TR BAND LRG (VASCULAR PRODUCTS) ×2 IMPLANT
GLIDESHEATH SLEND A-KIT 6F 22G (SHEATH) ×2 IMPLANT
GUIDEWIRE INQWIRE 1.5J.035X260 (WIRE) ×1 IMPLANT
INQWIRE 1.5J .035X260CM (WIRE) ×2
KIT HEART LEFT (KITS) ×2 IMPLANT
PACK CARDIAC CATHETERIZATION (CUSTOM PROCEDURE TRAY) ×2 IMPLANT
SYR MEDRAD MARK V 150ML (SYRINGE) ×2 IMPLANT
TRANSDUCER W/STOPCOCK (MISCELLANEOUS) ×2 IMPLANT
TUBING CIL FLEX 10 FLL-RA (TUBING) ×2 IMPLANT
WIRE HI TORQ VERSACORE-J 145CM (WIRE) ×2 IMPLANT

## 2018-04-25 NOTE — H&P (View-Only) (Signed)
Progress Note  Patient Name: Claudia [name] Date of Encounter: 04/25/2018  Primary Cardiologist: Candee Furbish, MD   Subjective   Feeling well. No chest pain or shortness of breath.   Inpatient Medications    Scheduled Meds: . amLODipine  10 mg Oral Daily  . aspirin EC  81 mg Oral Daily  . carvedilol  6.25 mg Oral BID  . ferrous sulfate  325 mg Oral BID  . glipiZIDE  5 mg Oral BID AC  . hydrochlorothiazide  25 mg Oral Daily  . insulin aspart  0-15 Units Subcutaneous TID WC  . linagliptin  5 mg Oral Daily  . losartan  100 mg Oral Daily  . sodium chloride flush  3 mL Intravenous Q12H  . spironolactone  25 mg Oral Daily  . Vitamin D (Ergocalciferol)  50,000 Units Oral Q7 days   Continuous Infusions: . sodium chloride    . sodium chloride 1 mL/kg/hr (04/25/18 0731)  . heparin 1,500 Units/hr (04/25/18 0008)   PRN Meds: sodium chloride, acetaminophen, magnesium hydroxide, nitroGLYCERIN, ondansetron (ZOFRAN) IV, sodium chloride flush   Vital Signs    Vitals:   04/25/18 0538 04/25/18 0911 04/25/18 1142 04/25/18 1147  BP: 113/73 (!) 153/101 (!) 159/84 (!) 189/82  Pulse: 79  (!) 52 (!) 55  Resp:      Temp: 97.8 F (36.6 C)     TempSrc: Oral     SpO2: 100%   97%  Weight: 298 lb 14.4 oz (135.6 kg)     Height:        Intake/Output Summary (Last 24 hours) at 04/25/2018 1156 Last data filed at 04/25/2018 0915 Gross per 24 hour  Intake 1180.64 ml  Output -  Net 1180.64 ml   Filed Weights   04/24/18 0707 04/24/18 1822 04/25/18 0538  Weight: (!) 303 lb (137.4 kg) (!) 302 lb 9.6 oz (137.3 kg) 298 lb 14.4 oz (135.6 kg)    Telemetry    Sinus rhythm.  Ventricular trigeminy.  - Personally Reviewed  ECG     Sinus rhythm.  Poor R wave progression.  Low voltage.  Cannot rule out prior anteroseptal infarct. - Personally Reviewed  Physical Exam   VS:  BP (!) 189/82   Pulse (!) 55   Temp 97.8 F (36.6 C) (Oral)   Resp 18   Ht 5\' 8"  (1.727 m)   Wt 298 lb 14.4 oz (135.6  kg)   LMP 05/09/2012   SpO2 97%   BMI 45.45 kg/m  , BMI Body mass index is 45.45 kg/m. GENERAL:  Well appearing HEENT: Pupils equal round and reactive, fundi not visualized, oral mucosa unremarkable NECK:  No jugular venous distention, waveform within normal limits, carotid upstroke brisk and symmetric, no bruits LUNGS:  Clear to auscultation bilaterally HEART:  RRR.  PMI not displaced or sustained,S1 and S2 within normal limits, no S3, no S4, no clicks, no rubs, no murmurs ABD:  Flat, positive bowel sounds normal in frequency in pitch, no bruits, no rebound, no guarding, no midline pulsatile mass, no hepatomegaly, no splenomegaly EXT:  2 plus pulses throughout, no edema, no cyanosis no clubbing SKIN:  No rashes no nodules NEURO:  Cranial nerves II through XII grossly intact, motor grossly intact throughout PSYCH:  Cognitively intact, oriented to person place and time   Labs    Chemistry Recent Labs  Lab 04/24/18 0810 04/25/18 0453  NA 140 140  K 4.1 4.0  CL 101 99  CO2 31 31  GLUCOSE 189*  175*  BUN 11 9  CREATININE 0.64 0.62  CALCIUM 9.2 9.4  GFRNONAA >60 >60  GFRAA >60 >60  ANIONGAP 8 10     Hematology Recent Labs  Lab 04/24/18 0810 04/25/18 0453  WBC 6.6 6.5  RBC 5.62* 5.41*  HGB 12.6 12.0  HCT 40.4 39.4  MCV 71.9* 72.8*  MCH 22.4* 22.2*  MCHC 31.2 30.5  RDW 15.1 14.8  PLT 384 369    Cardiac Enzymes Recent Labs  Lab 04/24/18 0810 04/24/18 1348  TROPONINI 0.03* 0.32*   No results for input(s): TROPIPOC in the last 168 hours.   BNPNo results for input(s): BNP, PROBNP in the last 168 hours.   DDimer No results for input(s): DDIMER in the last 168 hours.   Radiology    Dg Chest 2 View  Result Date: 04/24/2018 CLINICAL DATA:  Chest pain EXAM: CHEST - 2 VIEW COMPARISON:  03/01/2017 FINDINGS: The heart size and mediastinal contours are within normal limits. Both lungs are clear. The visualized skeletal structures are unremarkable. IMPRESSION: No  active cardiopulmonary disease. Electronically Signed   By: Inez Catalina M.D.   On: 04/24/2018 08:46    Cardiac Studies   Echo 04/20/17: Study Conclusions  - Left ventricle: The cavity size was normal. There was moderate   concentric hypertrophy. Systolic function was normal. The   estimated ejection fraction was in the range of 55% to 60%. Wall   motion was normal; there were no regional wall motion   abnormalities. There was an increased relative contribution of   atrial contraction to ventricular filling. Doppler parameters are   consistent with abnormal left ventricular relaxation (grade 1   diastolic dysfunction). - Aortic valve: Trileaflet; mildly calcified leaflets.  Patient Profile     52 y.o. female with chronic systolic and diastolic heart failure that subsequently improved, hypertension, diabetes, morbid obesity, and hyperlipidemia here with NSTEMI.    Assessment & Plan    # NSTEMI:   # Hyperlipidemia: Last troponin was 0.32 and climbing on 7/28.  Will add a troponin for today.  She is NPO for LHC today.  Continue aspirin, heparin, and carvedilol.   Risks and benefits of cardiac catheterization have been discussed with the patient.  The patient understands that risks included but are not limited to stroke (1 in 1000), death (1 in 84), kidney failure [usually temporary] (1 in 500), bleeding (1 in 200), allergic reaction [possibly serious] (1 in 200). The patient understands and agrees to proceed.   # Hypertension: BP poorly controlled.  Continue carvedilol, losartan, HCTZ, amlodipine and spironolactone.  HR has been in the 50s so we will not increase carvedilol. We will add hydralazine 25mg  q8h.  # Prior systolic heart failure: LVEF improved 03/2017.  However she reports LE edema, shortness of breath and troponin is elevated.  We will repeat her echo.    For questions or updates, please contact [place] Please consult [email or web address] for contact info under  Cardiology/STEMI.      Signed, Skeet Latch, MD  04/25/2018, 11:56 AM

## 2018-04-25 NOTE — Progress Notes (Signed)
Progress Note  Patient Name: Claudia [name] Date of Encounter: 04/25/2018  Primary Cardiologist: Candee Furbish, MD   Subjective   Feeling well. No chest pain or shortness of breath.   Inpatient Medications    Scheduled Meds: . amLODipine  10 mg Oral Daily  . aspirin EC  81 mg Oral Daily  . carvedilol  6.25 mg Oral BID  . ferrous sulfate  325 mg Oral BID  . glipiZIDE  5 mg Oral BID AC  . hydrochlorothiazide  25 mg Oral Daily  . insulin aspart  0-15 Units Subcutaneous TID WC  . linagliptin  5 mg Oral Daily  . losartan  100 mg Oral Daily  . sodium chloride flush  3 mL Intravenous Q12H  . spironolactone  25 mg Oral Daily  . Vitamin D (Ergocalciferol)  50,000 Units Oral Q7 days   Continuous Infusions: . sodium chloride    . sodium chloride 1 mL/kg/hr (04/25/18 0731)  . heparin 1,500 Units/hr (04/25/18 0008)   PRN Meds: sodium chloride, acetaminophen, magnesium hydroxide, nitroGLYCERIN, ondansetron (ZOFRAN) IV, sodium chloride flush   Vital Signs    Vitals:   04/25/18 0538 04/25/18 0911 04/25/18 1142 04/25/18 1147  BP: 113/73 (!) 153/101 (!) 159/84 (!) 189/82  Pulse: 79  (!) 52 (!) 55  Resp:      Temp: 97.8 F (36.6 C)     TempSrc: Oral     SpO2: 100%   97%  Weight: 298 lb 14.4 oz (135.6 kg)     Height:        Intake/Output Summary (Last 24 hours) at 04/25/2018 1156 Last data filed at 04/25/2018 0915 Gross per 24 hour  Intake 1180.64 ml  Output -  Net 1180.64 ml   Filed Weights   04/24/18 0707 04/24/18 1822 04/25/18 0538  Weight: (!) 303 lb (137.4 kg) (!) 302 lb 9.6 oz (137.3 kg) 298 lb 14.4 oz (135.6 kg)    Telemetry    Sinus rhythm.  Ventricular trigeminy.  - Personally Reviewed  ECG     Sinus rhythm.  Poor R wave progression.  Low voltage.  Cannot rule out prior anteroseptal infarct. - Personally Reviewed  Physical Exam   VS:  BP (!) 189/82   Pulse (!) 55   Temp 97.8 F (36.6 C) (Oral)   Resp 18   Ht 5\' 8"  (1.727 m)   Wt 298 lb 14.4 oz (135.6  kg)   LMP 05/09/2012   SpO2 97%   BMI 45.45 kg/m  , BMI Body mass index is 45.45 kg/m. GENERAL:  Well appearing HEENT: Pupils equal round and reactive, fundi not visualized, oral mucosa unremarkable NECK:  No jugular venous distention, waveform within normal limits, carotid upstroke brisk and symmetric, no bruits LUNGS:  Clear to auscultation bilaterally HEART:  RRR.  PMI not displaced or sustained,S1 and S2 within normal limits, no S3, no S4, no clicks, no rubs, no murmurs ABD:  Flat, positive bowel sounds normal in frequency in pitch, no bruits, no rebound, no guarding, no midline pulsatile mass, no hepatomegaly, no splenomegaly EXT:  2 plus pulses throughout, no edema, no cyanosis no clubbing SKIN:  No rashes no nodules NEURO:  Cranial nerves II through XII grossly intact, motor grossly intact throughout PSYCH:  Cognitively intact, oriented to person place and time   Labs    Chemistry Recent Labs  Lab 04/24/18 0810 04/25/18 0453  NA 140 140  K 4.1 4.0  CL 101 99  CO2 31 31  GLUCOSE 189*  175*  BUN 11 9  CREATININE 0.64 0.62  CALCIUM 9.2 9.4  GFRNONAA >60 >60  GFRAA >60 >60  ANIONGAP 8 10     Hematology Recent Labs  Lab 04/24/18 0810 04/25/18 0453  WBC 6.6 6.5  RBC 5.62* 5.41*  HGB 12.6 12.0  HCT 40.4 39.4  MCV 71.9* 72.8*  MCH 22.4* 22.2*  MCHC 31.2 30.5  RDW 15.1 14.8  PLT 384 369    Cardiac Enzymes Recent Labs  Lab 04/24/18 0810 04/24/18 1348  TROPONINI 0.03* 0.32*   No results for input(s): TROPIPOC in the last 168 hours.   BNPNo results for input(s): BNP, PROBNP in the last 168 hours.   DDimer No results for input(s): DDIMER in the last 168 hours.   Radiology    Dg Chest 2 View  Result Date: 04/24/2018 CLINICAL DATA:  Chest pain EXAM: CHEST - 2 VIEW COMPARISON:  03/01/2017 FINDINGS: The heart size and mediastinal contours are within normal limits. Both lungs are clear. The visualized skeletal structures are unremarkable. IMPRESSION: No  active cardiopulmonary disease. Electronically Signed   By: Inez Catalina M.D.   On: 04/24/2018 08:46    Cardiac Studies   Echo 04/20/17: Study Conclusions  - Left ventricle: The cavity size was normal. There was moderate   concentric hypertrophy. Systolic function was normal. The   estimated ejection fraction was in the range of 55% to 60%. Wall   motion was normal; there were no regional wall motion   abnormalities. There was an increased relative contribution of   atrial contraction to ventricular filling. Doppler parameters are   consistent with abnormal left ventricular relaxation (grade 1   diastolic dysfunction). - Aortic valve: Trileaflet; mildly calcified leaflets.  Patient Profile     52 y.o. female with chronic systolic and diastolic heart failure that subsequently improved, hypertension, diabetes, morbid obesity, and hyperlipidemia here with NSTEMI.    Assessment & Plan    # NSTEMI:   # Hyperlipidemia: Last troponin was 0.32 and climbing on 7/28.  Will add a troponin for today.  She is NPO for LHC today.  Continue aspirin, heparin, and carvedilol.   Risks and benefits of cardiac catheterization have been discussed with the patient.  The patient understands that risks included but are not limited to stroke (1 in 1000), death (1 in 28), kidney failure [usually temporary] (1 in 500), bleeding (1 in 200), allergic reaction [possibly serious] (1 in 200). The patient understands and agrees to proceed.   # Hypertension: BP poorly controlled.  Continue carvedilol, losartan, HCTZ, amlodipine and spironolactone.  HR has been in the 50s so we will not increase carvedilol. We will add hydralazine 25mg  q8h.  # Prior systolic heart failure: LVEF improved 03/2017.  However she reports LE edema, shortness of breath and troponin is elevated.  We will repeat her echo.    For questions or updates, please contact [place] Please consult [email or web address] for contact info under  Cardiology/STEMI.      Signed, Skeet Latch, MD  04/25/2018, 11:56 AM

## 2018-04-25 NOTE — Progress Notes (Signed)
  Echocardiogram 2D Echocardiogram has been performed.  Shaila Gilchrest L Androw 04/25/2018, 3:39 PM

## 2018-04-25 NOTE — Progress Notes (Signed)
[place] for heparin Indication: chest pain/ACS  Allergies  Allergen Reactions  . Crestor [Rosuvastatin]     MUSCLE ACHES HEADACHES   . Hydrocodone Itching  . Other Hives, Swelling and Other (See Comments)    Vision changes; ALLERGIC TO ALL NUTS EXCEPT PEANUTS  . Oxycodone Itching  . Pravachol [Pravastatin Sodium] Itching    Joint pain  . Simvastatin Hives    Patient Measurements: Height: 5\' 8"  (172.7 cm) Weight: 298 lb 14.4 oz (135.6 kg) IBW/kg (Calculated) : 63.9 Heparin Dosing Weight: 97 kg  Vital Signs: Temp: 97.8 F (36.6 C) (07/29 0538) Temp Source: Oral (07/29 0538) BP: 153/101 (07/29 0911) Pulse Rate: 79 (07/29 0538)  Labs: Recent Labs    04/24/18 0810 04/24/18 1348 04/24/18 2218 04/25/18 0453 04/25/18 0721  HGB 12.6  --   --  12.0  --   HCT 40.4  --   --  39.4  --   PLT 384  --   --  369  --   LABPROT  --   --   --  12.8  --   INR  --   --   --  0.97  --   HEPARINUNFRC  --   --  0.27*  --  0.42  CREATININE 0.64  --   --  0.62  --   TROPONINI 0.03* 0.32*  --   --   --     Estimated Creatinine Clearance: 123 mL/min (by C-G formula based on SCr of 0.62 mg/dL).   Assessment: 52 year old female continues on heparin for chest pain with plans for cath today Heparin level therapeutic today CBC stable  Goal of Therapy:  Heparin level 0.3-0.7 units/ml Monitor platelets by anticoagulation protocol: Yes   Plan:  Continue heparin at 1500 units / hr Follow up after cath  Thank you Anette Guarneri, PharmD [phone]  04/25/2018,9:25 AM

## 2018-04-25 NOTE — Interval H&P Note (Signed)
Cath Lab Visit (complete for each Cath Lab visit)  Clinical Evaluation Leading to the Procedure:   ACS: Yes.    Non-ACS:    Anginal Classification: CCS III  Anti-ischemic medical therapy: Maximal Therapy (2 or more classes of medications)  Non-Invasive Test Results: No non-invasive testing performed  Prior CABG: No previous CABG      History and Physical Interval Note:  04/25/2018 4:05 PM  Claudia [name]  has presented today for surgery, with the diagnosis of cp  The various methods of treatment have been discussed with the patient and family. After consideration of risks, benefits and other options for treatment, the patient has consented to  Procedure(s): LEFT HEART CATH AND CORONARY ANGIOGRAPHY (N/A) as a surgical intervention .  The patient's history has been reviewed, patient examined, no change in status, stable for surgery.  I have reviewed the patient's chart and labs.  Questions were answered to the patient's satisfaction.     Quay Burow

## 2018-04-26 ENCOUNTER — Encounter (HOSPITAL_COMMUNITY): Payer: Self-pay | Admitting: Cardiovascular Disease

## 2018-04-26 DIAGNOSIS — I5022 Chronic systolic (congestive) heart failure: Secondary | ICD-10-CM

## 2018-04-26 DIAGNOSIS — I248 Other forms of acute ischemic heart disease: Secondary | ICD-10-CM

## 2018-04-26 LAB — CBC
HCT: 39.9 % (ref 36.0–46.0)
Hemoglobin: 11.8 g/dL — ABNORMAL LOW (ref 12.0–15.0)
MCH: 21.9 pg — ABNORMAL LOW (ref 26.0–34.0)
MCHC: 29.6 g/dL — AB (ref 30.0–36.0)
MCV: 74 fL — ABNORMAL LOW (ref 78.0–100.0)
PLATELETS: 376 10*3/uL (ref 150–400)
RBC: 5.39 MIL/uL — ABNORMAL HIGH (ref 3.87–5.11)
RDW: 15.3 % (ref 11.5–15.5)
WBC: 7.1 10*3/uL (ref 4.0–10.5)

## 2018-04-26 LAB — BASIC METABOLIC PANEL
ANION GAP: 7 (ref 5–15)
BUN: 15 mg/dL (ref 6–20)
CALCIUM: 9 mg/dL (ref 8.9–10.3)
CO2: 30 mmol/L (ref 22–32)
CREATININE: 0.75 mg/dL (ref 0.44–1.00)
Chloride: 99 mmol/L (ref 98–111)
GFR calc Af Amer: >60 mL/min (ref >60)
Glucose, Bld: 187 mg/dL — ABNORMAL HIGH (ref 70–99)
Potassium: 4.3 mmol/L (ref 3.5–5.1)
SODIUM: 136 mmol/L (ref 135–145)

## 2018-04-26 LAB — GLUCOSE, CAPILLARY
GLUCOSE-CAPILLARY: 109 mg/dL — AB (ref 70–99)
Glucose-Capillary: 159 mg/dL — ABNORMAL HIGH (ref 70–99)

## 2018-04-26 MED ORDER — HYDRALAZINE HCL 25 MG PO TABS: 25.0000 mg | ORAL_TABLET | Freq: Three times a day (TID) | ORAL | 4 refills | Status: DC

## 2018-04-26 MED ORDER — LOSARTAN POTASSIUM-HCTZ 100-25 MG PO TABS: 1.0000 | ORAL_TABLET | Freq: Every day | ORAL | 4 refills | Status: DC

## 2018-04-26 MED ORDER — INSULIN ASPART 100 UNIT/ML ~~LOC~~ SOLN: 0.0000 [IU] | Freq: Three times a day (TID) | SUBCUTANEOUS | 11 refills | Status: DC

## 2018-04-26 MED ORDER — NITROGLYCERIN 0.4 MG SL SUBL: 0.4000 mg | SUBLINGUAL_TABLET | SUBLINGUAL | 0 refills | Status: DC | PRN

## 2018-04-26 MED ORDER — ASPIRIN 81 MG PO TBEC: 81.0000 mg | DELAYED_RELEASE_TABLET | Freq: Every day | ORAL | Status: DC

## 2018-04-26 NOTE — Discharge Summary (Addendum)
Discharge Summary    Patient ID: Claudia [name],  MRN: [id number], DOB/AGE: 1966/05/24 52 y.o.  Admit date: 04/24/2018 Discharge date: 04/26/2018  Primary Care Provider: Scot Jun Primary Cardiologist: Candee Furbish, MD  Discharge Diagnoses    Active Problems:   HTN (hypertension)   DM (diabetes mellitus) ([place])   Hyperlipidemia   Morbid obesity ([place])   NSTEMI (non-ST elevated myocardial infarction) [facility])   ACS (acute coronary syndrome) ([place])   Hypertensive heart disease with systolic congestive heart failure (HCC)  Allergies Allergies  Allergen Reactions  . Crestor [Rosuvastatin]     MUSCLE ACHES HEADACHES   . Hydrocodone Itching  . Other Hives, Swelling and Other (See Comments)    Vision changes; ALLERGIC TO ALL NUTS EXCEPT PEANUTS  . Oxycodone Itching  . Pravachol [Pravastatin Sodium] Itching    Joint pain  . Simvastatin Hives    Diagnostic Studies/Procedures    Cardiac catheterization 04/25/18: History obtained from chart review.52 y.o.femalewith chronic systolic and diastolic heart failure that subsequently improved, hypertension, diabetes, morbid obesity, and hyperlipidemia here with NSTEMI.  IMPRESSION: Ms. Corum has normal coronary arteries and normal filling pressures.  I believe her chest pain is noncardiac.  2D echo is pending for LV function.  Medical therapy for noncardiac chest pain will be recommended.  The sheath was removed and a TR band was placed on the right wrist to achieve patent hemostasis.  The patient left the lab in stable condition.   Echocardiogram 04/25/18: Study Conclusions  - Left ventricle: The cavity size was normal. There was mild   concentric hypertrophy. Systolic function was normal. The   estimated ejection fraction was in the range of 55% to 60%. Wall   motion was normal; there were no regional wall motion   abnormalities. Left ventricular diastolic function parameters   were normal. - Aortic valve: Valve area  (VTI): 2.33 cm^2. Valve area (Vmax):   2.23 cm^2. Valve area (Vmean): 2.03 cm^2. - Left atrium: The atrium was mildly dilated. - Pulmonic valve: There was trivial regurgitation. - Pulmonary arteries: Systolic pressure could not be accurately   estimated.  History of Present Illness     Ms. Claudia [name] is a 52 yo F with a hx of hypertension, DM2, hyperlipidemia, nonischemic cardiomyopathy with an inital EF of 40-45% that has since resolved who presented to [facility] on 04/24/18 with complaints of chest pain.   Pt stated that she was doing well until approximately 6am on day of admission when she began having left sided chest pain which occasionally radiated to her left arm. She described the pain as a dull pressure of moderate intensity. She stated that the pain resolved once she was in the ED. She stated that the pain did not fluctuate with breathing and reports difficulty "catching her breath with carrying on a conversation". She had associated nausea. She stated that there were no aggravating or alleviating factors. She had never had pain like this in the past. She reported medication compliance with well controlled BP's.   In the ED, she was found to have an elevated troponin at 0.32. She is noted to have a hx of nonischemic cardiomyopathy however had never had a cardiac catheterization. Her EKG revealed NSR with T-wave inversions in aVL. Given this, plan was made for cardiac cath.   Hospital Course   Given the above, the patient was taken to the cath lab on 04/25/18 which revealed clean coronary arteries and a normal LVEF. This was confirmed by echocardiogram.  Chest pain not thought to be cardiac in nature. She has a follow up appointment with Truitt Merle NP on 06/15/18 at 0930 am.   Hospital problems include:  1. NSTEMI: -Troponin levels continued to climb after admission, initial 0.03>>>0.32 -Given presentation and elevated trop>>plan was made for cardiac catheterization performed on  04/25/18 which revealed normal coronary arteries and normal filling pressures thought not to be cardiac in nature.  -Cath site unremarkable. Pt ambulated without complication prior to discharge   -Echocardiogram performed on 04/25/18 confirmed normal LV function with an LVEF of 55-60% with NWM.  -Continue ASA, amlodipine, spironolactone  -Will consolidate HCTZ/losartan to a combination tablet 25/100mg    2. Hypertension: -Currently controlled, 128/86>113/66>107/85 with the addition on hydralazine 25mg  Q8H -Also currently on carvedilol, losartan, HCTZ, amlodipine, and spironolactone   3. Hyperlipidemia: -Pt noted to be statin intolerant  -May benefit from lipid clinic referral for possible Repatha therapy   4. Prior hx of systolic heart failure which has resolved: -Given the pt ongoing complaints of SOB -Confirmed per echocardiogram 04/25/18 with LVEF of 55-60% and NWMA  5. DM2: -Stable, follow with PCP for any needed changes -Was managed with SSI while inpatient   Consultants: None   The patient was seen and examined by Dr. Oval Linsey who feels that she is stable and ready for discharge today, 04/26/18. Cath site unremarkable. Ambulated without difficulty prior to discharge.  _____________  Discharge Vitals Blood pressure 128/86, pulse 77, temperature 97.8 F (36.6 C), temperature source Oral, resp. rate 20, height 5\' 8"  (1.727 m), weight (!) 301 lb 12.8 oz (136.9 kg), last menstrual period 05/09/2012, SpO2 91 %.  Filed Weights   04/24/18 1822 04/25/18 0538 04/26/18 0522  Weight: (!) 302 lb 9.6 oz (137.3 kg) 298 lb 14.4 oz (135.6 kg) (!) 301 lb 12.8 oz (136.9 kg)   Labs & Radiologic Studies    CBC Recent Labs    04/25/18 0453 04/26/18 0513  WBC 6.5 7.1  HGB 12.0 11.8*  HCT 39.4 39.9  MCV 72.8* 74.0*  PLT 369 355   Basic Metabolic Panel Recent Labs    04/25/18 0453 04/26/18 0513  NA 140 136  K 4.0 4.3  CL 99 99  CO2 31 30  GLUCOSE 175* 187*  BUN 9 15  CREATININE  0.62 0.75  CALCIUM 9.4 9.0   Cardiac Enzymes Recent Labs    04/24/18 0810 04/24/18 1348  TROPONINI 0.03* 0.32*   Dg Chest 2 View  Result Date: 04/24/2018 CLINICAL DATA:  Chest pain EXAM: CHEST - 2 VIEW COMPARISON:  03/01/2017 FINDINGS: The heart size and mediastinal contours are within normal limits. Both lungs are clear. The visualized skeletal structures are unremarkable. IMPRESSION: No active cardiopulmonary disease. Electronically Signed   By: Inez Catalina M.D.   On: 04/24/2018 08:46   Disposition   Pt is being discharged home today in good condition.  Follow-up Plans & Appointments   Follow-up Information    Burtis Junes, NP Follow up on 06/15/2018.   Specialties:  Nurse Practitioner, Interventional Cardiology, Cardiology, Radiology Why:  Your appointment will be on 06/15/18 at 0930am  Contact information: [place]. [address] [place] [address] [phone]          Discharge Instructions    Call MD for:  difficulty breathing, headache or visual disturbances   Complete by:  As directed    Call MD for:  persistant dizziness or light-headedness   Complete by:  As directed    Call MD  for:  persistant nausea and vomiting   Complete by:  As directed    Call MD for:  redness, tenderness, or signs of infection (pain, swelling, redness, odor or green/yellow discharge around incision site)   Complete by:  As directed    Call MD for:  severe uncontrolled pain   Complete by:  As directed    Call MD for:  temperature >100.4   Complete by:  As directed    Diet - low sodium heart healthy   Complete by:  As directed    Discharge instructions   Complete by:  As directed    No driving for 3 days. No lifting over 5 lbs for 1 week. No sexual activity for 1 week. Keep procedure site clean & dry. If you notice increased pain, swelling, bleeding or pus, call/return!  You may shower, but no soaking baths/hot tubs/pools for 1 week.   You will need to hold your  Metformin until 04/28/18, then resume as previously taken.   We have added a medication called Hyzaar which is equivalent to the hydrochlorothiazide and losartan but in one pill instead of two.   Increase activity slowly   Complete by:  As directed      Discharge Medications   Allergies as of 04/26/2018      Reactions   Crestor [rosuvastatin]    MUSCLE ACHES HEADACHES   Hydrocodone Itching   Other Hives, Swelling, Other (See Comments)   Vision changes; ALLERGIC TO ALL NUTS EXCEPT PEANUTS   Oxycodone Itching   Pravachol [pravastatin Sodium] Itching   Joint pain   Simvastatin Hives      Medication List    STOP taking these medications   hydrochlorothiazide 25 MG tablet Commonly known as:  HYDRODIURIL   losartan 100 MG tablet Commonly known as:  COZAAR     TAKE these medications   amLODipine 10 MG tablet Commonly known as:  NORVASC Take 0.5 tablets (5 mg total) by mouth daily. What changed:  how much to take   aspirin 81 MG EC tablet Take 1 tablet (81 mg total) by mouth daily. Start taking on:  04/27/2018   carvedilol 6.25 MG tablet Commonly known as:  COREG Take 1 tablet (6.25 mg total) by mouth 2 (two) times daily.   ferrous sulfate 325 (65 FE) MG tablet Take 325 mg by mouth 2 (two) times daily.   glipiZIDE 5 MG tablet Commonly known as:  GLUCOTROL TAKE 1 TABLET BY MOUTH TWICE DAILY BEFORE  A  MEAL   hydrALAZINE 25 MG tablet Commonly known as:  APRESOLINE Take 1 tablet (25 mg total) by mouth every 8 (eight) hours.   insulin aspart 100 UNIT/ML injection Commonly known as:  novoLOG Inject 0-15 Units into the skin 3 (three) times daily with meals.   losartan-hydrochlorothiazide 100-25 MG tablet Commonly known as:  HYZAAR Take 1 tablet by mouth daily.   metFORMIN 1000 MG tablet Commonly known as:  GLUCOPHAGE Take 1 tablet (1,000 mg total) by mouth 2 (two) times daily with a meal.   nitroGLYCERIN 0.4 MG SL tablet Commonly known as:  NITROSTAT Place 1  tablet (0.4 mg total) under the tongue every 5 (five) minutes x 3 doses as needed for chest pain.   sitaGLIPtin 25 MG tablet Commonly known as:  JANUVIA Take 1 tablet (25 mg total) by mouth daily.   spironolactone 25 MG tablet Commonly known as:  ALDACTONE Take 1 tablet (25 mg total) by mouth daily.   Vitamin D (Ergocalciferol)  50000 units Caps capsule Commonly known as:  DRISDOL Take 1 capsule (50,000 Units total) by mouth every 7 (seven) days.        Acute coronary syndrome (MI, NSTEMI, STEMI, etc) this admission?:  No.  The elevated Troponin was due to the acute medical illness or demand ischemia.    Outstanding Labs/Studies   None   Duration of Discharge Encounter   Greater than 30 minutes including physician time.  Signed, Kathyrn Drown, NP 04/26/2018, 12:30 PM

## 2018-04-26 NOTE — Progress Notes (Signed)
Pt was discharged to home.  Discharge instruction was given to pt.  Belongings were sent home with pt.Idolina Primer, RN

## 2018-04-26 NOTE — Discharge Instructions (Signed)

## 2018-04-26 NOTE — Progress Notes (Signed)
Progress Note  Patient Name: Claudia [name] Date of Encounter: 04/26/2018  Primary Cardiologist: Candee Furbish, MD   Subjective   Feeling well. No chest pain or shortness of breath.  Ambulated without difficulty.  Inpatient Medications    Scheduled Meds: . amLODipine  10 mg Oral Daily  . aspirin EC  81 mg Oral Daily  . carvedilol  6.25 mg Oral BID  . ferrous sulfate  325 mg Oral BID  . glipiZIDE  5 mg Oral BID AC  . hydrALAZINE  25 mg Oral Q8H  . hydrochlorothiazide  25 mg Oral Daily  . insulin aspart  0-15 Units Subcutaneous TID WC  . linagliptin  5 mg Oral Daily  . losartan  100 mg Oral Daily  . sodium chloride flush  3 mL Intravenous Q12H  . spironolactone  25 mg Oral Daily  . Vitamin D (Ergocalciferol)  50,000 Units Oral Q7 days   Continuous Infusions: . sodium chloride     PRN Meds: sodium chloride, acetaminophen, magnesium hydroxide, nitroGLYCERIN, ondansetron (ZOFRAN) IV, sodium chloride flush   Vital Signs    Vitals:   04/25/18 2142 04/25/18 2200 04/26/18 0522 04/26/18 0825  BP: 107/85  113/66 128/86  Pulse:  84 77   Resp:      Temp:  (!) 97.4 F (36.3 C) 97.8 F (36.6 C)   TempSrc:  Oral Oral   SpO2:  94% 91%   Weight:   (!) 301 lb 12.8 oz (136.9 kg)   Height:        Intake/Output Summary (Last 24 hours) at 04/26/2018 1145 Last data filed at 04/26/2018 0829 Gross per 24 hour  Intake 1388.81 ml  Output -  Net 1388.81 ml   Filed Weights   04/24/18 1822 04/25/18 0538 04/26/18 0522  Weight: (!) 302 lb 9.6 oz (137.3 kg) 298 lb 14.4 oz (135.6 kg) (!) 301 lb 12.8 oz (136.9 kg)    Telemetry    Sinus rhythm.  Ventricular trigeminy.  - Personally Reviewed  ECG     Sinus rhythm.  Poor R wave progression.  Low voltage.  Cannot rule out prior anteroseptal infarct. - Personally Reviewed  Physical Exam   VS:  BP 128/86   Pulse 77   Temp 97.8 F (36.6 C) (Oral)   Resp 20   Ht 5\' 8"  (1.727 m)   Wt (!) 301 lb 12.8 oz (136.9 kg)   LMP 05/09/2012    SpO2 91%   BMI 45.89 kg/m  , BMI Body mass index is 45.89 kg/m. GENERAL:  Well appearing HEENT: Pupils equal round and reactive, fundi not visualized, oral mucosa unremarkable NECK:  No jugular venous distention, waveform within normal limits, carotid upstroke brisk and symmetric, no bruits LUNGS:  Clear to auscultation bilaterally HEART:  RRR.  PMI not displaced or sustained,S1 and S2 within normal limits, no S3, no S4, no clicks, no rubs, no murmurs ABD:  Flat, positive bowel sounds normal in frequency in pitch, no bruits, no rebound, no guarding, no midline pulsatile mass, no hepatomegaly, no splenomegaly EXT:  2 plus pulses throughout, no edema, no cyanosis no clubbing.  R wrist cath site C/D/I.  No ecchymosis or hematoma. SKIN:  No rashes no nodules NEURO:  Cranial nerves II through XII grossly intact, motor grossly intact throughout [facility]:  Cognitively intact, oriented to person place and time   Labs    Chemistry Recent Labs  Lab 04/24/18 0810 04/25/18 0453 04/26/18 0513  NA 140 140 136  K 4.1 4.0  4.3  CL 101 99 99  CO2 31 31 30   GLUCOSE 189* 175* 187*  BUN 11 9 15   CREATININE 0.64 0.62 0.75  CALCIUM 9.2 9.4 9.0  GFRNONAA >60 >60 >60  GFRAA >60 >60 >60  ANIONGAP 8 10 7      Hematology Recent Labs  Lab 04/24/18 0810 04/25/18 0453 04/26/18 0513  WBC 6.6 6.5 7.1  RBC 5.62* 5.41* 5.39*  HGB 12.6 12.0 11.8*  HCT 40.4 39.4 39.9  MCV 71.9* 72.8* 74.0*  MCH 22.4* 22.2* 21.9*  MCHC 31.2 30.5 29.6*  RDW 15.1 14.8 15.3  PLT 384 369 376    Cardiac Enzymes Recent Labs  Lab 04/24/18 0810 04/24/18 1348  TROPONINI 0.03* 0.32*   No results for input(s): TROPIPOC in the last 168 hours.   BNPNo results for input(s): BNP, PROBNP in the last 168 hours.   DDimer No results for input(s): DDIMER in the last 168 hours.   Radiology    No results found.  Cardiac Studies   Echo 04/20/17: Study Conclusions  - Left ventricle: The cavity size was normal. There was  moderate   concentric hypertrophy. Systolic function was normal. The   estimated ejection fraction was in the range of 55% to 60%. Wall   motion was normal; there were no regional wall motion   abnormalities. There was an increased relative contribution of   atrial contraction to ventricular filling. Doppler parameters are   consistent with abnormal left ventricular relaxation (grade 1   diastolic dysfunction). - Aortic valve: Trileaflet; mildly calcified leaflets.  LHC 04/25/18:  Normal coronary arteries. LVEDP 13 mmHg.   Echo 04/25/18: Study Conclusions  - Left ventricle: The cavity size was normal. There was mild   concentric hypertrophy. Systolic function was normal. The   estimated ejection fraction was in the range of 55% to 60%. Wall   motion was normal; there were no regional wall motion   abnormalities. Left ventricular diastolic function parameters   were normal. - Aortic valve: Valve area (VTI): 2.33 cm^2. Valve area (Vmax):   2.23 cm^2. Valve area (Vmean): 2.03 cm^2. - Left atrium: The atrium was mildly dilated. - Pulmonic valve: There was trivial regurgitation. - Pulmonary arteries: Systolic pressure could not be accurately   estimated.   Patient Profile     52 y.o. female with chronic systolic and diastolic heart failure that subsequently improved, hypertension, diabetes, morbid obesity, and hyperlipidemia here with NSTEMI.    Assessment & Plan    # Demand ischemia: # Hypertensive urgency: Troponin elevated to 0.32 in the setting of poorly controlled high blood pressure.  This is likely the cause of her chest pain and elevated troponin.  Blood pressure is much better after adding hydralazine this admission.  We will consolidate HCTZ/losartan to a combination tablet.  Continue carvedilol, amlodipine, and spironolactone.  # Prior systolic heart failure: LVEF 55 to 60% with normal diastolic function.  LVEDP was 13 mmHg on cath.   For questions or updates, please  contact [facility] Please consult [email or web address] for contact info under Cardiology/STEMI.      Signed, Skeet Latch, MD  04/26/2018, 11:45 AM

## 2018-05-04 ENCOUNTER — Telehealth: Payer: Self-pay | Admitting: Cardiology

## 2018-05-04 MED ORDER — NITROGLYCERIN 0.4 MG SL SUBL: 0.4000 mg | SUBLINGUAL_TABLET | SUBLINGUAL | 6 refills | Status: DC | PRN

## 2018-05-04 NOTE — Telephone Encounter (Signed)
Pt's medication was sent to pt's pharmacy as requested. Confirmation received.  °

## 2018-05-04 NOTE — Telephone Encounter (Signed)
New message    Pharmacy calling states patient lost medication. Patient in pharmacy now   1. Which medications need to be refilled? (please list name of each medication and dose if known) nitroGLYCERIN (NITROSTAT) 0.4 MG SL tablet 2. Which pharmacy/location (including street and city if local pharmacy) is medication to be sent to? [place], [address]  3. Do they need a 30 day or 90 day supply? [place]

## 2018-05-25 ENCOUNTER — Encounter: Payer: Self-pay | Admitting: Nurse Practitioner

## 2018-06-14 NOTE — Progress Notes (Signed)
CARDIOLOGY OFFICE NOTE  Date:  06/15/2018    Claudia [name] Date of Birth: 1966-08-19 Medical Record #[id number]  PCP:  Scot Jun, FNP  Cardiologist:  Marisa Cyphers    Chief Complaint  Patient presents with  . Cardiomyopathy    4 month check - seen for Dr. Marlou Porch    History of Present Illness: Claudia [name] is a 52 y.o. female who presents today for a follow up visit. Seen for Dr. Marlou Porch. This is a 4 month check as well as post hospital visit.   She has a history ofhypertension, diabetes, hyperlipidemia, cardiomyopathy, systolic LV dysfunctionwithejection fraction 40-45% with moderate LVH in the setting of hypertensive urgency. EF has improved per echo in 2016 and 2018.  Last seen by Dr. Marlou Porch in September of 2018 and was doing ok.I then saw her in March of 2019 for a follow up visit. She was "trying to get myself together". Off Coreg for unknown reasons and her HR was over a 100 - we restarted this but had to start at a lower dose. Previously on 37.5 mg BID.  Her cardiac symptoms were noted to be stable. Last visit with me was in May - frustrated over her weight but overall she was doing well without cardiac issue.    Admitted back in July - had chest pain - cath showed normal coronary arteries and normal filling pressures.   Comes in today. Herewith her daughter today. She is doing ok. No further chest pain. She is under the impression that her chest pain was indeed heart pain. Breathing is good. No problems with her cath site. Needs her meds refilled but really it is just her diabetic medicine that she needs. They both need work notes today. Weight fluctuating.  She has not had any of her medicines today yet. BP is up. Seems a little better at home but still not at goal. She stays tired, dizzy and has headache - unclear how much of this is related to headache. Trying to stay "calm" but was "fussing with a daughter earlier today". She is back at work.   Past  Medical History:  Diagnosis Date  . Diabetes mellitus   . Fibroids   . Headache(784.0)   . History of echocardiogram    Echo 7/18: Moderate concentric LVH, EF 55-60, normal wall motion, grade 1 diastolic dysfunction, calcified aortic leaflets  . Hypercholesterolemia   . Hyperlipidemia   . Hypertension   . Osteoarthritis    , with mild meniscus tear, followed by orthopedics--Dr Gladstone Lighter 04/29/10  . Right knee pain   . Seizures ([place])    as child  . Vitamin D deficiency     Past Surgical History:  Procedure Laterality Date  . ANTERIOR CERVICAL DECOMP/DISCECTOMY FUSION N/A 09/07/2013   Procedure: ANTERIOR CERVICAL DECOMPRESSION/DISCECTOMY FUSION 1 LEVEL;  Surgeon: Sinclair Ship, MD;  Location: [facility];  Service: Orthopedics;  Laterality: N/A;  Anterior cervical decompression fusion, cervical 5-6 with instrumentation and allograft  . LAPAROSCOPIC ASSISTED VAGINAL HYSTERECTOMY  1/14  . LEFT HEART CATH AND CORONARY ANGIOGRAPHY N/A 04/25/2018   Procedure: LEFT HEART CATH AND CORONARY ANGIOGRAPHY;  Surgeon: Lorretta Harp, MD;  Location: [facility];  Service: Cardiovascular;  Laterality: N/A;  . SHOULDER SURGERY Right 11   rotator cuff  . TUBAL LIGATION    . UMBILICAL HERNIA REPAIR       Medications: Current Meds  Medication Sig  . amLODipine (NORVASC) 10 MG tablet Take 10  mg by mouth daily.  Marland Kitchen aspirin EC 81 MG EC tablet Take 1 tablet (81 mg total) by mouth daily.  . ferrous sulfate 325 (65 FE) MG tablet Take 325 mg by mouth 2 (two) times daily.  Marland Kitchen glipiZIDE (GLUCOTROL) 5 MG tablet TAKE 1 TABLET BY MOUTH TWICE DAILY BEFORE  A  MEAL  . hydrALAZINE (APRESOLINE) 25 MG tablet Take 1 tablet (25 mg total) by mouth every 8 (eight) hours.  Marland Kitchen losartan-hydrochlorothiazide (HYZAAR) 100-25 MG tablet Take 1 tablet by mouth daily.  . metFORMIN (GLUCOPHAGE) 1000 MG tablet Take 1 tablet (1,000 mg total) by mouth 2 (two) times daily with a meal.  . nitroGLYCERIN (NITROSTAT) 0.4 MG SL  tablet Place 1 tablet (0.4 mg total) under the tongue every 5 (five) minutes x 3 doses as needed for chest pain.  . sitaGLIPtin (JANUVIA) 25 MG tablet Take 1 tablet (25 mg total) by mouth daily.  Marland Kitchen spironolactone (ALDACTONE) 25 MG tablet Take 1 tablet (25 mg total) by mouth daily.  . Vitamin D, Ergocalciferol, (DRISDOL) 50000 units CAPS capsule Take 1 capsule (50,000 Units total) by mouth every 7 (seven) days.  . [DISCONTINUED] carvedilol (COREG) 6.25 MG tablet Take 1 tablet (6.25 mg total) by mouth 2 (two) times daily.     Allergies: Allergies  Allergen Reactions  . Crestor [Rosuvastatin]     MUSCLE ACHES HEADACHES   . Hydrocodone Itching  . Other Hives, Swelling and Other (See Comments)    Vision changes; ALLERGIC TO ALL NUTS EXCEPT PEANUTS  . Oxycodone Itching  . Pravachol [Pravastatin Sodium] Itching    Joint pain  . Simvastatin Hives    Social History: The patient  reports that she has never smoked. She has never used smokeless tobacco. She reports that she does not drink alcohol or use drugs.   Family History: The patient's family history includes CVA in her maternal grandmother; Diabetes in her maternal grandmother and mother; Hypercholesterolemia in her mother; Hypertension in her maternal grandmother, mother, and sister.   Review of Systems: Please see the history of present illness.   Otherwise, the review of systems is positive for none.   All other systems are reviewed and negative.   Physical Exam: VS:  BP (!) 130/100 (BP Location: Left Arm, Patient Position: Sitting, Cuff Size: Large)   Pulse 91   Ht 5\' 8"  (1.727 m)   Wt (!) 303 lb 12.8 oz (137.8 kg)   LMP 05/09/2012   SpO2 90% Comment: at rest  BMI 46.19 kg/m  .  BMI Body mass index is 46.19 kg/m.  Wt Readings from Last 3 Encounters:  06/15/18 (!) 303 lb 12.8 oz (137.8 kg)  04/26/18 (!) 301 lb 12.8 oz (136.9 kg)  02/16/18 (!) 303 lb (137.4 kg)   BP is unchanged - 130/100 by me with a large cuff.    General: Pleasant. Alert and in no acute distress. She remains morbidly obese.   HEENT: Normal.  Neck: Supple, no JVD, carotid bruits, or masses noted.  Cardiac: Regular rate and rhythm. HR still in the 90's. No edema.  Respiratory:  Lungs are clear to auscultation bilaterally with normal work of breathing.  GI: Soft and nontender.  MS: No deformity or atrophy. Gait and ROM intact.  Skin: Warm and dry. Color is normal.  Neuro:  Strength and sensation are intact and no gross focal deficits noted.  Psych: Alert, appropriate and with normal affect. Cath site from the right wrist looks fine.    LABORATORY DATA:  EKG:  EKG is not ordered today.  Lab Results  Component Value Date   WBC 7.1 04/26/2018   HGB 11.8 (L) 04/26/2018   HCT 39.9 04/26/2018   PLT 376 04/26/2018   GLUCOSE 187 (H) 04/26/2018   CHOL 173 02/25/2017   TRIG 123 02/25/2017   HDL 46 (L) 02/25/2017   LDLCALC 102 (H) 02/25/2017   ALT 18 10/13/2017   AST 19 10/13/2017   NA 136 04/26/2018   K 4.3 04/26/2018   CL 99 04/26/2018   CREATININE 0.75 04/26/2018   BUN 15 04/26/2018   CO2 30 04/26/2018   TSH 0.52 02/25/2017   INR 0.97 04/25/2018   HGBA1C 7.7 10/13/2017   MICROALBUR 3.7 03/01/2017     BNP (last 3 results) No results for input(s): BNP in the last 8760 hours.  ProBNP (last 3 results) No results for input(s): PROBNP in the last 8760 hours.   Other Studies Reviewed Today:  Echo Study Conclusions 03/2018  - Left ventricle: The cavity size was normal. There was mild   concentric hypertrophy. Systolic function was normal. The   estimated ejection fraction was in the range of 55% to 60%. Wall   motion was normal; there were no regional wall motion   abnormalities. Left ventricular diastolic function parameters   were normal. - Aortic valve: Valve area (VTI): 2.33 cm^2. Valve area (Vmax):   2.23 cm^2. Valve area (Vmean): 2.03 cm^2. - Left atrium: The atrium was mildly dilated. - Pulmonic valve:  There was trivial regurgitation. - Pulmonary arteries: Systolic pressure could not be accurately   estimated.  Cardiac Cath 03/2018 IMPRESSION: Ms. Lansky has normal coronary arteries and normal filling pressures.  I believe her chest pain is noncardiac.  2D echo is pending for LV function.  Medical therapy for noncardiac chest pain will be recommended.  The sheath was removed and a TR band was placed on the right wrist to achieve patent hemostasis.  The patient left the lab in stable condition.  Quay Burow. MD, [facility] 04/25/2018 4:40 PM    ASSESSMENT AND PLAN:  1. Chest pain - mildly elevated troponin noted but with normal coronaries and normal filling pressures. Her chest pain was NOT felt to be cardiac. She is managed medically.   2. NICM - recent echo with normal EF noted. Would still try to maximize her medicines and focus on BP control.   3. HTN - not controlled - ? How much symptomatic she is with the headache and dizziness - increasing Coreg today - she was previously on 37.5 mg in the past (then stopped for unknown reasons).  Hydralazine was added at this last admission.   4. Hyperlipidemia: She is statin intolerant - cost big issue for her - may consider lipid referral in the future.   5. Prior hx of systolic heart failure which has resolved  6. DM2: trying to get her medicines refilled - cost is big factor in compliance.   7. Obesity- she struggles with this.   Current medicines are reviewed with the patient today.  The patient does not have concerns regarding medicines other than what has been noted above.  The following changes have been made:  See above.  Labs/ tests ordered today include:   No orders of the defined types were placed in this encounter.    Disposition:   FU with Dr. Marlou Porch in 3 months.   Patient is agreeable to this plan and will call if any problems develop in the interim.  SignedTruitt Merle, NP  06/15/2018 9:43 AM  [facility] [address] [place], [place]  [address] Phone: [phone] Fax: [phone]

## 2018-06-15 ENCOUNTER — Ambulatory Visit (INDEPENDENT_AMBULATORY_CARE_PROVIDER_SITE_OTHER): Payer: Managed Care, Other (non HMO) | Admitting: Nurse Practitioner

## 2018-06-15 ENCOUNTER — Encounter: Payer: Self-pay | Admitting: Nurse Practitioner

## 2018-06-15 VITALS — BP 130/100 | HR 91 | Ht 68.0 in | Wt 303.8 lb

## 2018-06-15 DIAGNOSIS — Z9889 Other specified postprocedural states: Secondary | ICD-10-CM

## 2018-06-15 DIAGNOSIS — E7800 Pure hypercholesterolemia, unspecified: Secondary | ICD-10-CM | POA: Diagnosis not present

## 2018-06-15 DIAGNOSIS — I10 Essential (primary) hypertension: Secondary | ICD-10-CM

## 2018-06-15 DIAGNOSIS — I428 Other cardiomyopathies: Secondary | ICD-10-CM

## 2018-06-15 MED ORDER — CARVEDILOL 12.5 MG PO TABS: 12.5000 mg | ORAL_TABLET | Freq: Two times a day (BID) | ORAL | 3 refills | Status: DC

## 2018-06-15 NOTE — Patient Instructions (Addendum)
We will be checking the following labs today - NONE   Medication Instructions:    Continue with your current medicines. BUT  I am going to increase the Coreg to 12.5 mg to take twice a day - you can take 2 of your 6.25 mg tablets twice a day and use up - the RX for the 12.5 mg tablet is at your pharmacy    Testing/Procedures To Be Arranged:  N/A  Follow-Up:   See Dr. Marlou Porch in 3 months   Other Special Instructions:   N/A    If you need a refill on your cardiac medications before your next appointment, please call your pharmacy.   Call the [place] office at [phone] if you have any questions, problems or concerns.

## 2018-06-25 ENCOUNTER — Other Ambulatory Visit: Payer: Self-pay | Admitting: Family Medicine

## 2018-06-27 ENCOUNTER — Other Ambulatory Visit: Payer: Self-pay | Admitting: Family Medicine

## 2018-06-28 ENCOUNTER — Other Ambulatory Visit: Payer: Self-pay | Admitting: Family Medicine

## 2018-06-28 ENCOUNTER — Other Ambulatory Visit: Payer: Self-pay

## 2018-06-28 DIAGNOSIS — E1159 Type 2 diabetes mellitus with other circulatory complications: Secondary | ICD-10-CM

## 2018-06-28 MED ORDER — SITAGLIPTIN PHOSPHATE 25 MG PO TABS: 25.0000 mg | ORAL_TABLET | Freq: Every day | ORAL | 0 refills | Status: DC

## 2018-06-28 MED ORDER — METFORMIN HCL 1000 MG PO TABS: 1000.0000 mg | ORAL_TABLET | Freq: Two times a day (BID) | ORAL | 0 refills | Status: DC

## 2018-06-28 NOTE — Telephone Encounter (Signed)
Patient was given enough pills to make it to appointment on Friday.

## 2018-06-28 NOTE — Progress Notes (Signed)
Rx for Januvia to pharmacy today.

## 2018-07-01 ENCOUNTER — Ambulatory Visit (INDEPENDENT_AMBULATORY_CARE_PROVIDER_SITE_OTHER): Payer: Managed Care, Other (non HMO) | Admitting: Family Medicine

## 2018-07-01 ENCOUNTER — Encounter: Payer: Self-pay | Admitting: Family Medicine

## 2018-07-01 VITALS — BP 142/80 | HR 92 | Temp 98.4°F | Ht 68.0 in | Wt 304.0 lb

## 2018-07-01 DIAGNOSIS — I10 Essential (primary) hypertension: Secondary | ICD-10-CM

## 2018-07-01 DIAGNOSIS — I252 Old myocardial infarction: Secondary | ICD-10-CM | POA: Diagnosis not present

## 2018-07-01 DIAGNOSIS — E1159 Type 2 diabetes mellitus with other circulatory complications: Secondary | ICD-10-CM | POA: Diagnosis not present

## 2018-07-01 DIAGNOSIS — E7849 Other hyperlipidemia: Secondary | ICD-10-CM

## 2018-07-01 DIAGNOSIS — Z09 Encounter for follow-up examination after completed treatment for conditions other than malignant neoplasm: Secondary | ICD-10-CM

## 2018-07-01 LAB — POCT URINALYSIS DIP (MANUAL ENTRY)
Bilirubin, UA: NEGATIVE
Blood, UA: NEGATIVE
Glucose, UA: NEGATIVE mg/dL
Ketones, POC UA: NEGATIVE mg/dL
Nitrite, UA: NEGATIVE
Protein Ur, POC: NEGATIVE mg/dL
Spec Grav, UA: 1.02 (ref 1.010–1.025)
Urobilinogen, UA: 1 E.U./dL
pH, UA: 7 (ref 5.0–8.0)

## 2018-07-01 LAB — POCT GLYCOSYLATED HEMOGLOBIN (HGB A1C): Hemoglobin A1C: 8.8 % — AB (ref 4.0–5.6)

## 2018-07-01 MED ORDER — METFORMIN HCL 1000 MG PO TABS: 1000.0000 mg | ORAL_TABLET | Freq: Two times a day (BID) | ORAL | 1 refills | Status: DC

## 2018-07-01 MED ORDER — SITAGLIPTIN PHOSPHATE 25 MG PO TABS: 25.0000 mg | ORAL_TABLET | Freq: Every day | ORAL | 1 refills | Status: DC

## 2018-07-01 MED ORDER — GLIPIZIDE 5 MG PO TABS: ORAL_TABLET | ORAL | 1 refills | Status: DC

## 2018-07-01 MED ORDER — AMLODIPINE BESYLATE 5 MG PO TABS: 5.0000 mg | ORAL_TABLET | Freq: Every day | ORAL | 3 refills | Status: DC

## 2018-07-01 NOTE — Progress Notes (Signed)
Follow Up  Subjective:    Patient ID: Claudia [name], female    DOB: June 25, 1966, 52 y.o.   MRN: [id number]   Chief Complaint  Patient presents with  . Follow-up    chronic condition    HPI  Claudia [name] is a 52 year old female with a past medical history of Vitamin D Deficiency, Seizures, Right Knee Pain, Osteoarthritis, Hypertension, Hyperlipidemia, Headache, Fibroids, and Diabetes. She is here today for follow up.   Current Status: Since her last office visit she is s/p: NSTEMI on 04/24/2018. She has been lost to follow up at our office, but she has continued to follow up with Cardiologist regularly. She does not report any residual side effects from her recent heart attack. She denies visual changes, chest pain, cough, shortness of breath, heart palpitations, and falls. She has occasionally headaches and dizziness with position changes. Denies severe headaches, confusion, seizures, double vision, and blurred vision, nausea and vomiting. She recently began taking all medications as prescribed, because of financial restrictions. She states that she is feeling much better since taking her medications regularly.   She denies increased thirst, frequent urination, hunger, fatigue, blurred vision, excessive hunger, excessive thirst, weight gain, weight loss, and poor wound healing.   She denies fevers, chills, fatigue, recent infections, weight loss, and night sweats. No reports of GI problems such as diarrhea, and constipation. She has no reports of blood in stools, dysuria and hematuria. No depression or anxiety reported. She denies pain today.   Past Medical History:  Diagnosis Date  . Diabetes mellitus   . Fibroids   . Headache(784.0)   . History of echocardiogram    Echo 7/18: Moderate concentric LVH, EF 55-60, normal wall motion, grade 1 diastolic dysfunction, calcified aortic leaflets  . Hypercholesterolemia   . Hyperlipidemia   . Hypertension   . Osteoarthritis    , with mild meniscus  tear, followed by orthopedics--Dr Gladstone Lighter 04/29/10  . Right knee pain   . Seizures ([place])    as child  . Vitamin D deficiency     Family History  Problem Relation Age of Onset  . Hypertension Mother   . Diabetes Mother   . Hypercholesterolemia Mother   . Hypertension Sister   . CVA Maternal Grandmother   . Hypertension Maternal Grandmother   . Diabetes Maternal Grandmother   . Heart attack Neg Hx    Social History   Socioeconomic History  . Marital status: Married    Spouse name: Not on file  . Number of children: Not on file  . Years of education: Not on file  . Highest education level: Not on file  Occupational History  . Not on file  Social Needs  . Financial resource strain: Not on file  . Food insecurity:    Worry: Not on file    Inability: Not on file  . Transportation needs:    Medical: Not on file    Non-medical: Not on file  Tobacco Use  . Smoking status: Never Smoker  . Smokeless tobacco: Never Used  Substance and Sexual Activity  . Alcohol use: No  . Drug use: No  . Sexual activity: Not on file  Lifestyle  . Physical activity:    Days per week: Not on file    Minutes per session: Not on file  . Stress: Not on file  Relationships  . Social connections:    Talks on phone: Not on file    Gets together: Not on file  Attends religious service: Not on file    Active member of club or organization: Not on file    Attends meetings of clubs or organizations: Not on file    Relationship status: Not on file  . Intimate partner violence:    Fear of current or ex partner: Not on file    Emotionally abused: Not on file    Physically abused: Not on file    Forced sexual activity: Not on file  Other Topics Concern  . Not on file  Social History Narrative  . Not on file    Past Surgical History:  Procedure Laterality Date  . ANTERIOR CERVICAL DECOMP/DISCECTOMY FUSION N/A 09/07/2013   Procedure: ANTERIOR CERVICAL DECOMPRESSION/DISCECTOMY FUSION 1 LEVEL;   Surgeon: Sinclair Ship, MD;  Location: [facility];  Service: Orthopedics;  Laterality: N/A;  Anterior cervical decompression fusion, cervical 5-6 with instrumentation and allograft  . LAPAROSCOPIC ASSISTED VAGINAL HYSTERECTOMY  1/14  . LEFT HEART CATH AND CORONARY ANGIOGRAPHY N/A 04/25/2018   Procedure: LEFT HEART CATH AND CORONARY ANGIOGRAPHY;  Surgeon: Lorretta Harp, MD;  Location: [facility];  Service: Cardiovascular;  Laterality: N/A;  . SHOULDER SURGERY Right 11   rotator cuff  . TUBAL LIGATION    . UMBILICAL HERNIA REPAIR      Immunization History  Administered Date(s) Administered  . Pneumococcal Polysaccharide-23 03/01/2017  . Tdap 03/01/2017    Current Meds  Medication Sig  . aspirin EC 81 MG EC tablet Take 1 tablet (81 mg total) by mouth daily.  . carvedilol (COREG) 12.5 MG tablet Take 1 tablet (12.5 mg total) by mouth 2 (two) times daily.  . ferrous sulfate 325 (65 FE) MG tablet Take 325 mg by mouth 2 (two) times daily.  Marland Kitchen glipiZIDE (GLUCOTROL) 5 MG tablet TAKE 1 TABLET BY MOUTH TWICE DAILY BEFORE  A  MEAL  . hydrALAZINE (APRESOLINE) 25 MG tablet Take 1 tablet (25 mg total) by mouth every 8 (eight) hours.  Marland Kitchen losartan-hydrochlorothiazide (HYZAAR) 100-25 MG tablet Take 1 tablet by mouth daily.  . metFORMIN (GLUCOPHAGE) 1000 MG tablet Take 1 tablet (1,000 mg total) by mouth 2 (two) times daily with a meal.  . nitroGLYCERIN (NITROSTAT) 0.4 MG SL tablet Place 1 tablet (0.4 mg total) under the tongue every 5 (five) minutes x 3 doses as needed for chest pain.  . Omega-3 Fatty Acids (FISH OIL) 1000 MG CAPS Take by mouth.  . sitaGLIPtin (JANUVIA) 25 MG tablet Take 1 tablet (25 mg total) by mouth daily.  Marland Kitchen spironolactone (ALDACTONE) 25 MG tablet Take 1 tablet (25 mg total) by mouth daily.  . Vitamin D, Ergocalciferol, (DRISDOL) 50000 units CAPS capsule Take 1 capsule (50,000 Units total) by mouth every 7 (seven) days.  . [DISCONTINUED] amLODipine (NORVASC) 10 MG tablet  Take 10 mg by mouth daily.  . [DISCONTINUED] glipiZIDE (GLUCOTROL) 5 MG tablet TAKE 1 TABLET BY MOUTH TWICE DAILY BEFORE  A  MEAL  . [DISCONTINUED] metFORMIN (GLUCOPHAGE) 1000 MG tablet Take 1 tablet (1,000 mg total) by mouth 2 (two) times daily with a meal.  . [DISCONTINUED] sitaGLIPtin (JANUVIA) 25 MG tablet Take 1 tablet (25 mg total) by mouth daily.    Allergies  Allergen Reactions  . Crestor [Rosuvastatin]     MUSCLE ACHES HEADACHES   . Hydrocodone Itching  . Other Hives, Swelling and Other (See Comments)    Vision changes; ALLERGIC TO ALL NUTS EXCEPT PEANUTS  . Oxycodone Itching  . Pravachol [Pravastatin Sodium] Itching  Joint pain  . Simvastatin Hives    BP (!) 142/80 (BP Location: Left Arm, Patient Position: Sitting, Cuff Size: Large)   Pulse 92   Temp 98.4 F (36.9 C) (Oral)   Ht 5\' 8"  (1.727 m)   Wt (!) 304 lb (137.9 kg)   LMP 05/09/2012   SpO2 94%   BMI 46.22 kg/m   Review of Systems  Constitutional: Negative.   Respiratory: Negative.   Cardiovascular: Negative.   Gastrointestinal: Positive for abdominal distention (Obese).  Genitourinary: Negative.   Musculoskeletal: Positive for arthralgias (generalized).  Skin: Negative.   Neurological: Positive for dizziness (Occasional) and headaches (Occasional).  Psychiatric/Behavioral: Negative.    Objective:   Physical Exam  Constitutional: She is oriented to person, place, and time. She appears well-developed and well-nourished.  Neck: Normal range of motion. Neck supple.  Cardiovascular: Normal rate, regular rhythm, normal heart sounds and intact distal pulses.  Pulmonary/Chest: Effort normal and breath sounds normal.  Abdominal: Soft. Bowel sounds are normal. She exhibits distension (Obese).  Musculoskeletal: Normal range of motion.  Neurological: She is alert and oriented to person, place, and time.  Skin: Skin is warm and dry.  Psychiatric: She has a normal mood and affect. Her behavior is normal.  Judgment and thought content normal.  Nursing note and vitals reviewed.  Assessment & Plan:   1. Hx of non-ST elevation myocardial infarction (NSTEMI) She is doing well. No residual side effects noted. She will continue medications as prescribed, and continue follow up with Cardiologist as needed.   2. Type 2 diabetes mellitus with other circulatory complication, unspecified whether long term insulin use (HCC) Hgb A1c is elevated at 8.8, from 7.7 on 10/13/2017. Continue Metformin, Glucotrol, and Januvia as prescribed. She will continue to decrease foods/beverages high in sugars and carbs and follow Heart Healthy or DASH diet. Increase physical activity to at least 30 minutes cardio exercise daily.  - POCT glycosylated hemoglobin (Hb A1C) - POCT urinalysis dipstick - sitaGLIPtin (JANUVIA) 25 MG tablet; Take 1 tablet (25 mg total) by mouth daily.  Dispense: 30 tablet; Refill: 1  3. Essential hypertension Blood pressure is stable at 142/80 today. She will continue Amlodipine, Carvedilol, Hydralazine, Losartan-HCTZ, and Spironolactone as prescribed. She will continue to decrease high sodium intake, excessive alcohol intake, increase potassium intake, smoking cessation, and increase physical activity of at least 30 minutes of cardio activity daily. She will continue to follow Heart Healthy or DASH diet. - amLODipine (NORVASC) 5 MG tablet; Take 1 tablet (5 mg total) by mouth daily.  Dispense: 90 tablet; Refill: 3  4. Other hyperlipidemia Stable. Continue Omega Fish Oil as prescribed.   5. Follow up She will follow up in 1 month. We will drawn essential labs at that time.   Meds ordered this encounter  Medications  . metFORMIN (GLUCOPHAGE) 1000 MG tablet    Sig: Take 1 tablet (1,000 mg total) by mouth 2 (two) times daily with a meal.    Dispense:  60 tablet    Refill:  1  . glipiZIDE (GLUCOTROL) 5 MG tablet    Sig: TAKE 1 TABLET BY MOUTH TWICE DAILY BEFORE  A  MEAL    Dispense:  60 tablet     Refill:  1  . sitaGLIPtin (JANUVIA) 25 MG tablet    Sig: Take 1 tablet (25 mg total) by mouth daily.    Dispense:  30 tablet    Refill:  1    Patient needs appointment  . amLODipine (NORVASC) 5 MG  tablet    Sig: Take 1 tablet (5 mg total) by mouth daily.    Dispense:  90 tablet    Refill:  [place],  MSN, [facility] [place] [address] [place], [place] [address] [phone]

## 2018-07-01 NOTE — Patient Instructions (Signed)
Heart-Healthy Eating Plan Heart-healthy meal planning includes:  Limiting unhealthy fats.  Increasing healthy fats.  Making other small dietary changes.  You may need to talk with your doctor or a diet specialist (dietitian) to create an eating plan that is right for you. What types of fat should I choose?  Choose healthy fats. These include olive oil and canola oil, flaxseeds, walnuts, almonds, and seeds.  Eat more omega-3 fats. These include salmon, mackerel, sardines, tuna, flaxseed oil, and ground flaxseeds. Try to eat fish at least twice each week.  Limit saturated fats. ? Saturated fats are often found in animal products, such as meats, butter, and cream. ? Plant sources of saturated fats include palm oil, palm kernel oil, and coconut oil.  Avoid foods with partially hydrogenated oils in them. These include stick margarine, some tub margarines, cookies, crackers, and other baked goods. These contain trans fats. What general guidelines do I need to follow?  Check food labels carefully. Identify foods with trans fats or high amounts of saturated fat.  Fill one half of your plate with vegetables and green salads. Eat 4-5 servings of vegetables per day. A serving of vegetables is: ? 1 cup of raw leafy vegetables. ?  cup of raw or cooked cut-up vegetables. ?  cup of vegetable juice.  Fill one fourth of your plate with whole grains. Look for the word "whole" as the first word in the ingredient list.  Fill one fourth of your plate with lean protein foods.  Eat 4-5 servings of fruit per day. A serving of fruit is: ? One medium whole fruit. ?  cup of dried fruit. ?  cup of fresh, frozen, or canned fruit. ?  cup of 100% fruit juice.  Eat more foods that contain soluble fiber. These include apples, broccoli, carrots, beans, peas, and barley. Try to get 20-30 g of fiber per day.  Eat more home-cooked food. Eat less restaurant, buffet, and fast food.  Limit or avoid  alcohol.  Limit foods high in starch and sugar.  Avoid fried foods.  Avoid frying your food. Try baking, boiling, grilling, or broiling it instead. You can also reduce fat by: ? Removing the skin from poultry. ? Removing all visible fats from meats. ? Skimming the fat off of stews, soups, and gravies before serving them. ? Steaming vegetables in water or broth.  Lose weight if you are overweight.  Eat 4-5 servings of nuts, legumes, and seeds per week: ? One serving of dried beans or legumes equals  cup after being cooked. ? One serving of nuts equals 1 ounces. ? One serving of seeds equals  ounce or one tablespoon.  You may need to keep track of how much salt or sodium you eat. This is especially true if you have high blood pressure. Talk with your doctor or dietitian to get more information. What foods can I eat? Grains Breads, including French, white, pita, wheat, raisin, rye, oatmeal, and Italian. Tortillas that are neither fried nor made with lard or trans fat. Low-fat rolls, including hotdog and hamburger buns and English muffins. Biscuits. Muffins. Waffles. Pancakes. Light popcorn. Whole-grain cereals. Flatbread. Melba toast. Pretzels. Breadsticks. Rusks. Low-fat snacks. Low-fat crackers, including oyster, saltine, matzo, graham, animal, and rye. Rice and pasta, including brown rice and pastas that are made with whole wheat. Vegetables All vegetables. Fruits All fruits, but limit coconut. Meats and Other Protein Sources Lean, well-trimmed beef, veal, pork, and lamb. Chicken and turkey without skin. All fish and shellfish.   Wild duck, rabbit, pheasant, and venison. Egg whites or low-cholesterol egg substitutes. Dried beans, peas, lentils, and tofu. Seeds and most nuts. Dairy Low-fat or nonfat cheeses, including ricotta, string, and mozzarella. Skim or 1% milk that is liquid, powdered, or evaporated. Buttermilk that is made with low-fat milk. Nonfat or low-fat  yogurt. Beverages Mineral water. Diet carbonated beverages. Sweets and Desserts Sherbets and fruit ices. Honey, jam, marmalade, jelly, and syrups. Meringues and gelatins. Pure sugar candy, such as hard candy, jelly beans, gumdrops, mints, marshmallows, and small amounts of dark chocolate. Angel food cake. Eat all sweets and desserts in moderation. Fats and Oils Nonhydrogenated (trans-free) margarines. Vegetable oils, including soybean, sesame, sunflower, olive, peanut, safflower, corn, canola, and cottonseed. Salad dressings or mayonnaise made with a vegetable oil. Limit added fats and oils that you use for cooking, baking, salads, and as spreads. Other Cocoa powder. Coffee and tea. All seasonings and condiments. The items listed above may not be a complete list of recommended foods or beverages. Contact your dietitian for more options. What foods are not recommended? Grains Breads that are made with saturated or trans fats, oils, or whole milk. Croissants. Butter rolls. Cheese breads. Sweet rolls. Donuts. Buttered popcorn. Chow mein noodles. High-fat crackers, such as cheese or butter crackers. Meats and Other Protein Sources Fatty meats, such as hotdogs, short ribs, sausage, spareribs, bacon, rib eye roast or steak, and mutton. High-fat deli meats, such as salami and bologna. Caviar. Domestic duck and goose. Organ meats, such as kidney, liver, sweetbreads, and heart. Dairy Cream, sour cream, cream cheese, and creamed cottage cheese. Whole-milk cheeses, including blue (bleu), Monterey Jack, Brie, [place], American, Havarti, Swiss, cheddar, Camembert, and [place]. Whole or 2% milk that is liquid, evaporated, or condensed. Whole buttermilk. Cream sauce or high-fat cheese sauce. Yogurt that is made from whole milk. Beverages Regular sodas and juice drinks with added sugar. Sweets and Desserts Frosting. Pudding. Cookies. Cakes other than angel food cake. Candy that has milk chocolate or white  chocolate, hydrogenated fat, butter, coconut, or unknown ingredients. Buttered syrups. Full-fat ice cream or ice cream drinks. Fats and Oils Gravy that has suet, meat fat, or shortening. Cocoa butter, hydrogenated oils, palm oil, coconut oil, palm kernel oil. These can often be found in baked products, candy, fried foods, nondairy creamers, and whipped toppings. Solid fats and shortenings, including bacon fat, salt pork, lard, and butter. Nondairy cream substitutes, such as coffee creamers and sour cream substitutes. Salad dressings that are made of unknown oils, cheese, or sour cream. The items listed above may not be a complete list of foods and beverages to avoid. Contact your dietitian for more information. This information is not intended to replace advice given to you by your health care provider. Make sure you discuss any questions you have with your health care provider. Document Released: 03/15/2012 Document Revised: 02/20/2016 Document Reviewed: 03/08/2014 [employer] Interactive Patient Education  2018 [employer] DASH Eating Plan DASH stands for "Dietary Approaches to Stop Hypertension." The DASH eating plan is a healthy eating plan that has been shown to reduce high blood pressure (hypertension). It may also reduce your risk for type 2 diabetes, heart disease, and stroke. The DASH eating plan may also help with weight loss. What are tips for following this plan? General guidelines  Avoid eating more than 2,300 mg (milligrams) of salt (sodium) a day. If you have hypertension, you may need to reduce your sodium intake to 1,500 mg a day.  Limit alcohol intake to no more   than 1 drink a day for nonpregnant women and 2 drinks a day for men. One drink equals 12 oz of beer, 5 oz of wine, or 1 oz of hard liquor.  Work with your health care provider to maintain a healthy body weight or to lose weight. Ask what an ideal weight is for you.  Get at least 30 minutes of exercise that causes your  heart to beat faster (aerobic exercise) most days of the week. Activities may include walking, swimming, or biking.  Work with your health care provider or diet and nutrition specialist (dietitian) to adjust your eating plan to your individual calorie needs. Reading food labels  Check food labels for the amount of sodium per serving. Choose foods with less than 5 percent of the Daily Value of sodium. Generally, foods with less than 300 mg of sodium per serving fit into this eating plan.  To find whole grains, look for the word "whole" as the first word in the ingredient list. Shopping  Buy products labeled as "low-sodium" or "no salt added."  Buy fresh foods. Avoid canned foods and premade or frozen meals. Cooking  Avoid adding salt when cooking. Use salt-free seasonings or herbs instead of table salt or sea salt. Check with your health care provider or pharmacist before using salt substitutes.  Do not fry foods. Cook foods using healthy methods such as baking, boiling, grilling, and broiling instead.  Cook with heart-healthy oils, such as olive, canola, soybean, or sunflower oil. Meal planning   Eat a balanced diet that includes: ? 5 or more servings of fruits and vegetables each day. At each meal, try to fill half of your plate with fruits and vegetables. ? Up to 6-8 servings of whole grains each day. ? Less than 6 oz of lean meat, poultry, or fish each day. A 3-oz serving of meat is about the same size as a deck of cards. One egg equals 1 oz. ? 2 servings of low-fat dairy each day. ? A serving of nuts, seeds, or beans 5 times each week. ? Heart-healthy fats. Healthy fats called Omega-3 fatty acids are found in foods such as flaxseeds and coldwater fish, like sardines, salmon, and mackerel.  Limit how much you eat of the following: ? Canned or prepackaged foods. ? Food that is high in trans fat, such as fried foods. ? Food that is high in saturated fat, such as fatty  meat. ? Sweets, desserts, sugary drinks, and other foods with added sugar. ? Full-fat dairy products.  Do not salt foods before eating.  Try to eat at least 2 vegetarian meals each week.  Eat more home-cooked food and less restaurant, buffet, and fast food.  When eating at a restaurant, ask that your food be prepared with less salt or no salt, if possible. What foods are recommended? The items listed may not be a complete list. Talk with your dietitian about what dietary choices are best for you. Grains Whole-grain or whole-wheat bread. Whole-grain or whole-wheat pasta. Brown rice. Oatmeal. Quinoa. Bulgur. Whole-grain and low-sodium cereals. Pita bread. Low-fat, low-sodium crackers. Whole-wheat flour tortillas. Vegetables Fresh or frozen vegetables (raw, steamed, roasted, or grilled). Low-sodium or reduced-sodium tomato and vegetable juice. Low-sodium or reduced-sodium tomato sauce and tomato paste. Low-sodium or reduced-sodium canned vegetables. Fruits All fresh, dried, or frozen fruit. Canned fruit in natural juice (without added sugar). Meat and other protein foods Skinless chicken or turkey. Ground chicken or turkey. Pork with fat trimmed off. Fish and seafood. Egg   whites. Dried beans, peas, or lentils. Unsalted nuts, nut butters, and seeds. Unsalted canned beans. Lean cuts of beef with fat trimmed off. Low-sodium, lean deli meat. Dairy Low-fat (1%) or fat-free (skim) milk. Fat-free, low-fat, or reduced-fat cheeses. Nonfat, low-sodium ricotta or cottage cheese. Low-fat or nonfat yogurt. Low-fat, low-sodium cheese. Fats and oils Soft margarine without trans fats. Vegetable oil. Low-fat, reduced-fat, or light mayonnaise and salad dressings (reduced-sodium). Canola, safflower, olive, soybean, and sunflower oils. Avocado. Seasoning and other foods Herbs. Spices. Seasoning mixes without salt. Unsalted popcorn and pretzels. Fat-free sweets. What foods are not recommended? The items listed  may not be a complete list. Talk with your dietitian about what dietary choices are best for you. Grains Baked goods made with fat, such as croissants, muffins, or some breads. Dry pasta or rice meal packs. Vegetables Creamed or fried vegetables. Vegetables in a cheese sauce. Regular canned vegetables (not low-sodium or reduced-sodium). Regular canned tomato sauce and paste (not low-sodium or reduced-sodium). Regular tomato and vegetable juice (not low-sodium or reduced-sodium). Pickles. Olives. Fruits Canned fruit in a light or heavy syrup. Fried fruit. Fruit in cream or butter sauce. Meat and other protein foods Fatty cuts of meat. Ribs. Fried meat. Bacon. Sausage. Bologna and other processed lunch meats. Salami. Fatback. Hotdogs. Bratwurst. Salted nuts and seeds. Canned beans with added salt. Canned or smoked fish. Whole eggs or egg yolks. Chicken or turkey with skin. Dairy Whole or 2% milk, cream, and half-and-half. Whole or full-fat cream cheese. Whole-fat or sweetened yogurt. Full-fat cheese. Nondairy creamers. Whipped toppings. Processed cheese and cheese spreads. Fats and oils Butter. Stick margarine. Lard. Shortening. Ghee. Bacon fat. Tropical oils, such as coconut, palm kernel, or palm oil. Seasoning and other foods Salted popcorn and pretzels. Onion salt, garlic salt, seasoned salt, table salt, and sea salt. Worcestershire sauce. Tartar sauce. Barbecue sauce. Teriyaki sauce. Soy sauce, including reduced-sodium. Steak sauce. Canned and packaged gravies. Fish sauce. Oyster sauce. Cocktail sauce. Horseradish that you find on the shelf. Ketchup. Mustard. Meat flavorings and tenderizers. Bouillon cubes. Hot sauce and Tabasco sauce. Premade or packaged marinades. Premade or packaged taco seasonings. Relishes. Regular salad dressings. Where to find more information:  National Heart, Lung, and Blood Institute: www.nhlbi.nih.gov  American Heart Association: www.heart.org Summary  The DASH  eating plan is a healthy eating plan that has been shown to reduce high blood pressure (hypertension). It may also reduce your risk for type 2 diabetes, heart disease, and stroke.  With the DASH eating plan, you should limit salt (sodium) intake to 2,300 mg a day. If you have hypertension, you may need to reduce your sodium intake to 1,500 mg a day.  When on the DASH eating plan, aim to eat more fresh fruits and vegetables, whole grains, lean proteins, low-fat dairy, and heart-healthy fats.  Work with your health care provider or diet and nutrition specialist (dietitian) to adjust your eating plan to your individual calorie needs. This information is not intended to replace advice given to you by your health care provider. Make sure you discuss any questions you have with your health care provider. Document Released: 09/03/2011 Document Revised: 09/07/2016 Document Reviewed: 09/07/2016 [employer] Interactive Patient Education  2018 [employer]  

## 2018-08-01 ENCOUNTER — Other Ambulatory Visit: Payer: Self-pay

## 2018-08-01 MED ORDER — SPIRONOLACTONE 25 MG PO TABS: 25.0000 mg | ORAL_TABLET | Freq: Every day | ORAL | 1 refills | Status: DC

## 2018-08-01 NOTE — Telephone Encounter (Signed)
Medication sent to pharmacy  

## 2018-08-05 ENCOUNTER — Ambulatory Visit (INDEPENDENT_AMBULATORY_CARE_PROVIDER_SITE_OTHER): Payer: Managed Care, Other (non HMO) | Admitting: Family Medicine

## 2018-08-05 ENCOUNTER — Encounter: Payer: Self-pay | Admitting: Family Medicine

## 2018-08-05 VITALS — BP 140/76 | HR 80 | Temp 97.7°F | Ht 68.0 in | Wt 309.0 lb

## 2018-08-05 DIAGNOSIS — I10 Essential (primary) hypertension: Secondary | ICD-10-CM | POA: Diagnosis not present

## 2018-08-05 DIAGNOSIS — Z0000 Encounter for general adult medical examination without abnormal findings: Secondary | ICD-10-CM | POA: Diagnosis not present

## 2018-08-05 DIAGNOSIS — E66813 Obesity, class 3: Secondary | ICD-10-CM

## 2018-08-05 DIAGNOSIS — E1159 Type 2 diabetes mellitus with other circulatory complications: Secondary | ICD-10-CM

## 2018-08-05 DIAGNOSIS — Z6841 Body Mass Index (BMI) 40.0 and over, adult: Secondary | ICD-10-CM

## 2018-08-05 DIAGNOSIS — I252 Old myocardial infarction: Secondary | ICD-10-CM

## 2018-08-05 DIAGNOSIS — E119 Type 2 diabetes mellitus without complications: Secondary | ICD-10-CM

## 2018-08-05 DIAGNOSIS — Z09 Encounter for follow-up examination after completed treatment for conditions other than malignant neoplasm: Secondary | ICD-10-CM

## 2018-08-05 LAB — POCT URINALYSIS DIP (MANUAL ENTRY)
Bilirubin, UA: NEGATIVE
Blood, UA: NEGATIVE
Glucose, UA: NEGATIVE mg/dL
Ketones, POC UA: NEGATIVE mg/dL
Nitrite, UA: NEGATIVE
Protein Ur, POC: NEGATIVE mg/dL
Spec Grav, UA: >=1.03 — AB (ref 1.010–1.025)
Urobilinogen, UA: 0.2 E.U./dL
pH, UA: 5.5 (ref 5.0–8.0)

## 2018-08-05 LAB — POCT GLYCOSYLATED HEMOGLOBIN (HGB A1C): Hemoglobin A1C: 8.4 % — AB (ref 4.0–5.6)

## 2018-08-05 NOTE — Progress Notes (Signed)
Follow Up  Subjective:    Patient ID: Claudia [name], female    DOB: 1966-07-08, 52 y.o.   MRN: [id number]   Chief Complaint  Patient presents with  . Annual Exam   HPI  Ms. Lewan is a 52 year old female with a past medical history of Vitamin D Deficiency, Seizures, Right Knee Pain, Osteoarthritis, Hypertension, Hyperlipidemia, Headache, Fibroids, and Diabetes. She is here for assessment follow up of chronic diseases Annual Exam.  Current Status: Since her last office visit, she is doing well with no complaints.   She continues to struggle with weight problems. She continues to work on her diet. She is accompanied by her husband today. She is having financial problems with Januvia, but she has begun taking medication. She denies increased thirst, frequent urination, hunger, fatigue, blurred vision, excessive hunger, excessive thirst, weight gain, weight loss, and poor wound healing. She denies visual changes, chest pain, cough, shortness of breath, heart palpitations, and falls. She has occasionally headaches and dizziness with position changes. Denies severe headaches, confusion, seizures, double vision, and blurred vision, nausea and vomiting.  She denies fevers, chills, fatigue, recent infections, weight loss, and night sweats. She has not had any falls. No chest pain, heart palpitations, cough and shortness of breath reported. No reports of GI problems such as nausea, vomiting, diarrhea, and constipation. She has no reports of blood in stools, dysuria and hematuria. No depression or anxiety, and denies suicidal ideations, homicidal ideations, or auditory hallucinations. She denies pain today.    Past Medical History:  Diagnosis Date  . Diabetes mellitus   . Fibroids   . Headache(784.0)   . History of echocardiogram    Echo 7/18: Moderate concentric LVH, EF 55-60, normal wall motion, grade 1 diastolic dysfunction, calcified aortic leaflets  . Hypercholesterolemia   . Hyperlipidemia    . Hypertension   . Osteoarthritis    , with mild meniscus tear, followed by orthopedics--Dr Gladstone Lighter 04/29/10  . Right knee pain   . Seizures ([place])    as child  . Vitamin D deficiency    Family History  Problem Relation Age of Onset  . Hypertension Mother   . Diabetes Mother   . Hypercholesterolemia Mother   . Hypertension Sister   . CVA Maternal Grandmother   . Hypertension Maternal Grandmother   . Diabetes Maternal Grandmother   . Heart attack Neg Hx     Social History   Socioeconomic History  . Marital status: Married    Spouse name: Not on file  . Number of children: Not on file  . Years of education: Not on file  . Highest education level: Not on file  Occupational History  . Not on file  Social Needs  . Financial resource strain: Not on file  . Food insecurity:    Worry: Not on file    Inability: Not on file  . Transportation needs:    Medical: Not on file    Non-medical: Not on file  Tobacco Use  . Smoking status: Never Smoker  . Smokeless tobacco: Never Used  Substance and Sexual Activity  . Alcohol use: No  . Drug use: No  . Sexual activity: Not on file  Lifestyle  . Physical activity:    Days per week: Not on file    Minutes per session: Not on file  . Stress: Not on file  Relationships  . Social connections:    Talks on phone: Not on file    Gets together: Not on  file    Attends religious service: Not on file    Active member of club or organization: Not on file    Attends meetings of clubs or organizations: Not on file    Relationship status: Not on file  . Intimate partner violence:    Fear of current or ex partner: Not on file    Emotionally abused: Not on file    Physically abused: Not on file    Forced sexual activity: Not on file  Other Topics Concern  . Not on file  Social History Narrative  . Not on file    Past Surgical History:  Procedure Laterality Date  . ANTERIOR CERVICAL DECOMP/DISCECTOMY FUSION N/A 09/07/2013    Procedure: ANTERIOR CERVICAL DECOMPRESSION/DISCECTOMY FUSION 1 LEVEL;  Surgeon: Sinclair Ship, MD;  Location: [facility];  Service: Orthopedics;  Laterality: N/A;  Anterior cervical decompression fusion, cervical 5-6 with instrumentation and allograft  . LAPAROSCOPIC ASSISTED VAGINAL HYSTERECTOMY  1/14  . LEFT HEART CATH AND CORONARY ANGIOGRAPHY N/A 04/25/2018   Procedure: LEFT HEART CATH AND CORONARY ANGIOGRAPHY;  Surgeon: Lorretta Harp, MD;  Location: [facility];  Service: Cardiovascular;  Laterality: N/A;  . SHOULDER SURGERY Right 11   rotator cuff  . TUBAL LIGATION    . UMBILICAL HERNIA REPAIR      Immunization History  Administered Date(s) Administered  . Pneumococcal Polysaccharide-23 03/01/2017  . Tdap 03/01/2017    Current Meds  Medication Sig  . amLODipine (NORVASC) 5 MG tablet Take 1 tablet (5 mg total) by mouth daily.  Marland Kitchen aspirin EC 81 MG EC tablet Take 1 tablet (81 mg total) by mouth daily.  . carvedilol (COREG) 12.5 MG tablet Take 1 tablet (12.5 mg total) by mouth 2 (two) times daily.  . ferrous sulfate 325 (65 FE) MG tablet Take 325 mg by mouth 2 (two) times daily.  Marland Kitchen glipiZIDE (GLUCOTROL) 5 MG tablet TAKE 1 TABLET BY MOUTH TWICE DAILY BEFORE  A  MEAL  . hydrALAZINE (APRESOLINE) 25 MG tablet Take 1 tablet (25 mg total) by mouth every 8 (eight) hours.  Marland Kitchen losartan-hydrochlorothiazide (HYZAAR) 100-25 MG tablet Take 1 tablet by mouth daily.  . metFORMIN (GLUCOPHAGE) 1000 MG tablet Take 1 tablet (1,000 mg total) by mouth 2 (two) times daily with a meal.  . nitroGLYCERIN (NITROSTAT) 0.4 MG SL tablet Place 1 tablet (0.4 mg total) under the tongue every 5 (five) minutes x 3 doses as needed for chest pain.  . Omega-3 Fatty Acids (FISH OIL) 1000 MG CAPS Take by mouth.  . sitaGLIPtin (JANUVIA) 25 MG tablet Take 1 tablet (25 mg total) by mouth daily.  Marland Kitchen spironolactone (ALDACTONE) 25 MG tablet Take 1 tablet (25 mg total) by mouth daily.  . Vitamin D, Ergocalciferol,  (DRISDOL) 50000 units CAPS capsule Take 1 capsule (50,000 Units total) by mouth every 7 (seven) days.   Allergies  Allergen Reactions  . Crestor [Rosuvastatin]     MUSCLE ACHES HEADACHES   . Hydrocodone Itching  . Other Hives, Swelling and Other (See Comments)    Vision changes; ALLERGIC TO ALL NUTS EXCEPT PEANUTS  . Oxycodone Itching  . Pravachol [Pravastatin Sodium] Itching    Joint pain  . Simvastatin Hives    BP 140/76 (BP Location: Left Arm, Patient Position: Sitting, Cuff Size: Large)   Pulse 80   Temp 97.7 F (36.5 C) (Oral)   Ht 5\' 8"  (1.727 m)   Wt (!) 309 lb (140.2 kg)   LMP 05/09/2012  SpO2 98%   BMI 46.98 kg/m    Review of Systems  Constitutional: Negative.   HENT: Negative.   Eyes: Negative.   Respiratory: Positive for shortness of breath (Occasional ).   Cardiovascular: Negative.   Endocrine: Negative.   Genitourinary: Negative.   Musculoskeletal: Negative.   Skin: Negative.   Allergic/Immunologic: Negative.   Neurological: Negative.   Psychiatric/Behavioral: Negative.    Objective:   Physical Exam  Constitutional: She is oriented to person, place, and time. She appears well-developed and well-nourished.  HENT:  Head: Normocephalic and atraumatic.  Eyes: Pupils are equal, round, and reactive to light. EOM are normal.  Neck: Normal range of motion. Neck supple.  Cardiovascular: Normal rate, regular rhythm, normal heart sounds and intact distal pulses.  Pulmonary/Chest: Effort normal and breath sounds normal.  Abdominal: Soft. Bowel sounds are normal. She exhibits distension (Obese).  Musculoskeletal: Normal range of motion.  Neurological: She is alert and oriented to person, place, and time.  Skin: Skin is warm and dry.  Psychiatric: She has a normal mood and affect. Her behavior is normal. Judgment and thought content normal.  Nursing note and vitals reviewed.  Assessment & Plan:    1. Annual physical exam  2. Type 2 diabetes mellitus with  other circulatory complication, unspecified whether long term insulin use (HCC) Improving. Hgb A1c is at 8.4 today, from 8.8 on 07/01/2018. She will continue Glucotrol, Metformin, and Januvia as prescribed.  She will continue to decrease foods/beverages high in sugars and carbs and follow Heart Healthy or DASH diet. Increase physical activity to at least 30 minutes cardio exercise daily.  - POCT glycosylated hemoglobin (Hb A1C) - POCT urinalysis dipstick  3. Hx of non-ST elevation myocardial infarction (NSTEMI) Stable. She has no reports of chest pain, heart palpitations, and numbness in left extremity. She has Nitrostat as needed.   4. Essential hypertension Antihypertensive medications are effective. Blood pressure is stable at 140/76 today. She will continue Amlodipine, Carvedilol, Hydralazine, and Hyzaar as prescribed. She will continue to decrease high sodium intake, excessive alcohol intake, increase potassium intake, smoking cessation, and increase physical activity of at least 30 minutes of cardio activity daily. She will continue to follow Heart Healthy or DASH diet.  5. Healthcare maintenance - CBC with Differential - Comprehensive metabolic panel - Lipid Panel - Vitamin D, 25-hydroxy - Vitamin B12 - TSH  6. Class 3 severe obesity due to excess calories with serious comorbidity and body mass index (BMI) of 45.0 to 49.9 in adult [facility]) Body mass index is 46.98 kg/m. Goal BMI  is <30. Encouraged efforts to reduce weight include engaging in physical activity as tolerated with goal of 150 minutes per week. Improve dietary choices and eat a meal regimen consistent with a Mediterranean or DASH diet. Reduce simple carbohydrates. Do not skip meals and eat healthy snacks throughout the day to avoid over-eating at dinner. Set a goal weight loss that is achievable for you.  7. Encounter for diabetic foot exam Negative. No decreased sensitivity noted upon foot exam. She is encouraged to exam  feet often,using mirror if necessary; keep feet clean and dry, wear cotton socks, and avoid wearing open-toed shoes, high-heel shoes, and sandals.   8. Follow up She will follow up in 3 months.   No orders of the defined types were placed in this encounter.   Kathe Becton,  MSN, FNP-C [facility] [address] [place], [place] [address] [phone]

## 2018-08-05 NOTE — Patient Instructions (Signed)
Heart-Healthy Eating Plan Heart-healthy meal planning includes:  Limiting unhealthy fats.  Increasing healthy fats.  Making other small dietary changes.  You may need to talk with your doctor or a diet specialist (dietitian) to create an eating plan that is right for you. What types of fat should I choose?  Choose healthy fats. These include olive oil and canola oil, flaxseeds, walnuts, almonds, and seeds.  Eat more omega-3 fats. These include salmon, mackerel, sardines, tuna, flaxseed oil, and ground flaxseeds. Try to eat fish at least twice each week.  Limit saturated fats. ? Saturated fats are often found in animal products, such as meats, butter, and cream. ? Plant sources of saturated fats include palm oil, palm kernel oil, and coconut oil.  Avoid foods with partially hydrogenated oils in them. These include stick margarine, some tub margarines, cookies, crackers, and other baked goods. These contain trans fats. What general guidelines do I need to follow?  Check food labels carefully. Identify foods with trans fats or high amounts of saturated fat.  Fill one half of your plate with vegetables and green salads. Eat 4-5 servings of vegetables per day. A serving of vegetables is: ? 1 cup of raw leafy vegetables. ?  cup of raw or cooked cut-up vegetables. ?  cup of vegetable juice.  Fill one fourth of your plate with whole grains. Look for the word "whole" as the first word in the ingredient list.  Fill one fourth of your plate with lean protein foods.  Eat 4-5 servings of fruit per day. A serving of fruit is: ? One medium whole fruit. ?  cup of dried fruit. ?  cup of fresh, frozen, or canned fruit. ?  cup of 100% fruit juice.  Eat more foods that contain soluble fiber. These include apples, broccoli, carrots, beans, peas, and barley. Try to get 20-30 g of fiber per day.  Eat more home-cooked food. Eat less restaurant, buffet, and fast food.  Limit or avoid  alcohol.  Limit foods high in starch and sugar.  Avoid fried foods.  Avoid frying your food. Try baking, boiling, grilling, or broiling it instead. You can also reduce fat by: ? Removing the skin from poultry. ? Removing all visible fats from meats. ? Skimming the fat off of stews, soups, and gravies before serving them. ? Steaming vegetables in water or broth.  Lose weight if you are overweight.  Eat 4-5 servings of nuts, legumes, and seeds per week: ? One serving of dried beans or legumes equals  cup after being cooked. ? One serving of nuts equals 1 ounces. ? One serving of seeds equals  ounce or one tablespoon.  You may need to keep track of how much salt or sodium you eat. This is especially true if you have high blood pressure. Talk with your doctor or dietitian to get more information. What foods can I eat? Grains Breads, including French, white, pita, wheat, raisin, rye, oatmeal, and Italian. Tortillas that are neither fried nor made with lard or trans fat. Low-fat rolls, including hotdog and hamburger buns and English muffins. Biscuits. Muffins. Waffles. Pancakes. Light popcorn. Whole-grain cereals. Flatbread. Melba toast. Pretzels. Breadsticks. Rusks. Low-fat snacks. Low-fat crackers, including oyster, saltine, matzo, graham, animal, and rye. Rice and pasta, including brown rice and pastas that are made with whole wheat. Vegetables All vegetables. Fruits All fruits, but limit coconut. Meats and Other Protein Sources Lean, well-trimmed beef, veal, pork, and lamb. Chicken and turkey without skin. All fish and shellfish.   Wild duck, rabbit, pheasant, and venison. Egg whites or low-cholesterol egg substitutes. Dried beans, peas, lentils, and tofu. Seeds and most nuts. Dairy Low-fat or nonfat cheeses, including ricotta, string, and mozzarella. Skim or 1% milk that is liquid, powdered, or evaporated. Buttermilk that is made with low-fat milk. Nonfat or low-fat  yogurt. Beverages Mineral water. Diet carbonated beverages. Sweets and Desserts Sherbets and fruit ices. Honey, jam, marmalade, jelly, and syrups. Meringues and gelatins. Pure sugar candy, such as hard candy, jelly beans, gumdrops, mints, marshmallows, and small amounts of dark chocolate. Angel food cake. Eat all sweets and desserts in moderation. Fats and Oils Nonhydrogenated (trans-free) margarines. Vegetable oils, including soybean, sesame, sunflower, olive, peanut, safflower, corn, canola, and cottonseed. Salad dressings or mayonnaise made with a vegetable oil. Limit added fats and oils that you use for cooking, baking, salads, and as spreads. Other Cocoa powder. Coffee and tea. All seasonings and condiments. The items listed above may not be a complete list of recommended foods or beverages. Contact your dietitian for more options. What foods are not recommended? Grains Breads that are made with saturated or trans fats, oils, or whole milk. Croissants. Butter rolls. Cheese breads. Sweet rolls. Donuts. Buttered popcorn. Chow mein noodles. High-fat crackers, such as cheese or butter crackers. Meats and Other Protein Sources Fatty meats, such as hotdogs, short ribs, sausage, spareribs, bacon, rib eye roast or steak, and mutton. High-fat deli meats, such as salami and bologna. Caviar. Domestic duck and goose. Organ meats, such as kidney, liver, sweetbreads, and heart. Dairy Cream, sour cream, cream cheese, and creamed cottage cheese. Whole-milk cheeses, including blue (bleu), Monterey Jack, Brie, [place], American, Havarti, Swiss, cheddar, Camembert, and [place]. Whole or 2% milk that is liquid, evaporated, or condensed. Whole buttermilk. Cream sauce or high-fat cheese sauce. Yogurt that is made from whole milk. Beverages Regular sodas and juice drinks with added sugar. Sweets and Desserts Frosting. Pudding. Cookies. Cakes other than angel food cake. Candy that has milk chocolate or white  chocolate, hydrogenated fat, butter, coconut, or unknown ingredients. Buttered syrups. Full-fat ice cream or ice cream drinks. Fats and Oils Gravy that has suet, meat fat, or shortening. Cocoa butter, hydrogenated oils, palm oil, coconut oil, palm kernel oil. These can often be found in baked products, candy, fried foods, nondairy creamers, and whipped toppings. Solid fats and shortenings, including bacon fat, salt pork, lard, and butter. Nondairy cream substitutes, such as coffee creamers and sour cream substitutes. Salad dressings that are made of unknown oils, cheese, or sour cream. The items listed above may not be a complete list of foods and beverages to avoid. Contact your dietitian for more information. This information is not intended to replace advice given to you by your health care provider. Make sure you discuss any questions you have with your health care provider. Document Released: 03/15/2012 Document Revised: 02/20/2016 Document Reviewed: 03/08/2014 [employer] Interactive Patient Education  2018 [employer] DASH Eating Plan DASH stands for "Dietary Approaches to Stop Hypertension." The DASH eating plan is a healthy eating plan that has been shown to reduce high blood pressure (hypertension). It may also reduce your risk for type 2 diabetes, heart disease, and stroke. The DASH eating plan may also help with weight loss. What are tips for following this plan? General guidelines  Avoid eating more than 2,300 mg (milligrams) of salt (sodium) a day. If you have hypertension, you may need to reduce your sodium intake to 1,500 mg a day.  Limit alcohol intake to no more   than 1 drink a day for nonpregnant women and 2 drinks a day for men. One drink equals 12 oz of beer, 5 oz of wine, or 1 oz of hard liquor.  Work with your health care provider to maintain a healthy body weight or to lose weight. Ask what an ideal weight is for you.  Get at least 30 minutes of exercise that causes your  heart to beat faster (aerobic exercise) most days of the week. Activities may include walking, swimming, or biking.  Work with your health care provider or diet and nutrition specialist (dietitian) to adjust your eating plan to your individual calorie needs. Reading food labels  Check food labels for the amount of sodium per serving. Choose foods with less than 5 percent of the Daily Value of sodium. Generally, foods with less than 300 mg of sodium per serving fit into this eating plan.  To find whole grains, look for the word "whole" as the first word in the ingredient list. Shopping  Buy products labeled as "low-sodium" or "no salt added."  Buy fresh foods. Avoid canned foods and premade or frozen meals. Cooking  Avoid adding salt when cooking. Use salt-free seasonings or herbs instead of table salt or sea salt. Check with your health care provider or pharmacist before using salt substitutes.  Do not fry foods. Cook foods using healthy methods such as baking, boiling, grilling, and broiling instead.  Cook with heart-healthy oils, such as olive, canola, soybean, or sunflower oil. Meal planning   Eat a balanced diet that includes: ? 5 or more servings of fruits and vegetables each day. At each meal, try to fill half of your plate with fruits and vegetables. ? Up to 6-8 servings of whole grains each day. ? Less than 6 oz of lean meat, poultry, or fish each day. A 3-oz serving of meat is about the same size as a deck of cards. One egg equals 1 oz. ? 2 servings of low-fat dairy each day. ? A serving of nuts, seeds, or beans 5 times each week. ? Heart-healthy fats. Healthy fats called Omega-3 fatty acids are found in foods such as flaxseeds and coldwater fish, like sardines, salmon, and mackerel.  Limit how much you eat of the following: ? Canned or prepackaged foods. ? Food that is high in trans fat, such as fried foods. ? Food that is high in saturated fat, such as fatty  meat. ? Sweets, desserts, sugary drinks, and other foods with added sugar. ? Full-fat dairy products.  Do not salt foods before eating.  Try to eat at least 2 vegetarian meals each week.  Eat more home-cooked food and less restaurant, buffet, and fast food.  When eating at a restaurant, ask that your food be prepared with less salt or no salt, if possible. What foods are recommended? The items listed may not be a complete list. Talk with your dietitian about what dietary choices are best for you. Grains Whole-grain or whole-wheat bread. Whole-grain or whole-wheat pasta. Brown rice. Oatmeal. Quinoa. Bulgur. Whole-grain and low-sodium cereals. Pita bread. Low-fat, low-sodium crackers. Whole-wheat flour tortillas. Vegetables Fresh or frozen vegetables (raw, steamed, roasted, or grilled). Low-sodium or reduced-sodium tomato and vegetable juice. Low-sodium or reduced-sodium tomato sauce and tomato paste. Low-sodium or reduced-sodium canned vegetables. Fruits All fresh, dried, or frozen fruit. Canned fruit in natural juice (without added sugar). Meat and other protein foods Skinless chicken or turkey. Ground chicken or turkey. Pork with fat trimmed off. Fish and seafood. Egg   whites. Dried beans, peas, or lentils. Unsalted nuts, nut butters, and seeds. Unsalted canned beans. Lean cuts of beef with fat trimmed off. Low-sodium, lean deli meat. Dairy Low-fat (1%) or fat-free (skim) milk. Fat-free, low-fat, or reduced-fat cheeses. Nonfat, low-sodium ricotta or cottage cheese. Low-fat or nonfat yogurt. Low-fat, low-sodium cheese. Fats and oils Soft margarine without trans fats. Vegetable oil. Low-fat, reduced-fat, or light mayonnaise and salad dressings (reduced-sodium). Canola, safflower, olive, soybean, and sunflower oils. Avocado. Seasoning and other foods Herbs. Spices. Seasoning mixes without salt. Unsalted popcorn and pretzels. Fat-free sweets. What foods are not recommended? The items listed  may not be a complete list. Talk with your dietitian about what dietary choices are best for you. Grains Baked goods made with fat, such as croissants, muffins, or some breads. Dry pasta or rice meal packs. Vegetables Creamed or fried vegetables. Vegetables in a cheese sauce. Regular canned vegetables (not low-sodium or reduced-sodium). Regular canned tomato sauce and paste (not low-sodium or reduced-sodium). Regular tomato and vegetable juice (not low-sodium or reduced-sodium). Pickles. Olives. Fruits Canned fruit in a light or heavy syrup. Fried fruit. Fruit in cream or butter sauce. Meat and other protein foods Fatty cuts of meat. Ribs. Fried meat. Bacon. Sausage. Bologna and other processed lunch meats. Salami. Fatback. Hotdogs. Bratwurst. Salted nuts and seeds. Canned beans with added salt. Canned or smoked fish. Whole eggs or egg yolks. Chicken or turkey with skin. Dairy Whole or 2% milk, cream, and half-and-half. Whole or full-fat cream cheese. Whole-fat or sweetened yogurt. Full-fat cheese. Nondairy creamers. Whipped toppings. Processed cheese and cheese spreads. Fats and oils Butter. Stick margarine. Lard. Shortening. Ghee. Bacon fat. Tropical oils, such as coconut, palm kernel, or palm oil. Seasoning and other foods Salted popcorn and pretzels. Onion salt, garlic salt, seasoned salt, table salt, and sea salt. Worcestershire sauce. Tartar sauce. Barbecue sauce. Teriyaki sauce. Soy sauce, including reduced-sodium. Steak sauce. Canned and packaged gravies. Fish sauce. Oyster sauce. Cocktail sauce. Horseradish that you find on the shelf. Ketchup. Mustard. Meat flavorings and tenderizers. Bouillon cubes. Hot sauce and Tabasco sauce. Premade or packaged marinades. Premade or packaged taco seasonings. Relishes. Regular salad dressings. Where to find more information:  National Heart, Lung, and Blood Institute: www.nhlbi.nih.gov  American Heart Association: www.heart.org Summary  The DASH  eating plan is a healthy eating plan that has been shown to reduce high blood pressure (hypertension). It may also reduce your risk for type 2 diabetes, heart disease, and stroke.  With the DASH eating plan, you should limit salt (sodium) intake to 2,300 mg a day. If you have hypertension, you may need to reduce your sodium intake to 1,500 mg a day.  When on the DASH eating plan, aim to eat more fresh fruits and vegetables, whole grains, lean proteins, low-fat dairy, and heart-healthy fats.  Work with your health care provider or diet and nutrition specialist (dietitian) to adjust your eating plan to your individual calorie needs. This information is not intended to replace advice given to you by your health care provider. Make sure you discuss any questions you have with your health care provider. Document Released: 09/03/2011 Document Revised: 09/07/2016 Document Reviewed: 09/07/2016 [employer] Interactive Patient Education  2018 [employer]  

## 2018-08-06 LAB — LIPID PANEL
Chol/HDL Ratio: 3.4 ratio (ref 0.0–4.4)
Cholesterol, Total: 177 mg/dL (ref 100–199)
HDL: 52 mg/dL (ref >39)
LDL Calculated: 112 mg/dL — ABNORMAL HIGH (ref 0–99)
Triglycerides: 67 mg/dL (ref 0–149)
VLDL Cholesterol Cal: 13 mg/dL (ref 5–40)

## 2018-08-06 LAB — COMPREHENSIVE METABOLIC PANEL
ALT: 16 IU/L (ref 0–32)
AST: 13 IU/L (ref 0–40)
Albumin/Globulin Ratio: 1.3 (ref 1.2–2.2)
Albumin: 4.2 g/dL (ref 3.5–5.5)
Alkaline Phosphatase: 46 IU/L (ref 39–117)
BUN/Creatinine Ratio: 14 (ref 9–23)
BUN: 10 mg/dL (ref 6–24)
Bilirubin Total: 0.5 mg/dL (ref 0.0–1.2)
CO2: 27 mmol/L (ref 20–29)
Calcium: 9.5 mg/dL (ref 8.7–10.2)
Chloride: 99 mmol/L (ref 96–106)
Creatinine, Ser: 0.69 mg/dL (ref 0.57–1.00)
GFR calc Af Amer: 117 mL/min/{1.73_m2} (ref >59)
GFR calc non Af Amer: 102 mL/min/{1.73_m2} (ref >59)
Globulin, Total: 3.2 g/dL (ref 1.5–4.5)
Glucose: 148 mg/dL — ABNORMAL HIGH (ref 65–99)
Potassium: 4.4 mmol/L (ref 3.5–5.2)
Sodium: 139 mmol/L (ref 134–144)
Total Protein: 7.4 g/dL (ref 6.0–8.5)

## 2018-08-06 LAB — CBC WITH DIFFERENTIAL/PLATELET
Basophils Absolute: 0 10*3/uL (ref 0.0–0.2)
Basos: 0 %
EOS (ABSOLUTE): 0.1 10*3/uL (ref 0.0–0.4)
Eos: 3 %
Hematocrit: 38.1 % (ref 34.0–46.6)
Hemoglobin: 11.7 g/dL (ref 11.1–15.9)
Immature Grans (Abs): 0 10*3/uL (ref 0.0–0.1)
Immature Granulocytes: 0 %
Lymphocytes Absolute: 2.2 10*3/uL (ref 0.7–3.1)
Lymphs: 41 %
MCH: 22 pg — ABNORMAL LOW (ref 26.6–33.0)
MCHC: 30.7 g/dL — ABNORMAL LOW (ref 31.5–35.7)
MCV: 72 fL — ABNORMAL LOW (ref 79–97)
Monocytes Absolute: 0.4 10*3/uL (ref 0.1–0.9)
Monocytes: 8 %
Neutrophils Absolute: 2.6 10*3/uL (ref 1.4–7.0)
Neutrophils: 48 %
Platelets: 423 10*3/uL (ref 150–450)
RBC: 5.32 x10E6/uL — ABNORMAL HIGH (ref 3.77–5.28)
RDW: 14.6 % (ref 12.3–15.4)
WBC: 5.4 10*3/uL (ref 3.4–10.8)

## 2018-08-06 LAB — TSH: TSH: 0.392 u[IU]/mL — ABNORMAL LOW (ref 0.450–4.500)

## 2018-08-06 LAB — VITAMIN D 25 HYDROXY (VIT D DEFICIENCY, FRACTURES): Vit D, 25-Hydroxy: 29.8 ng/mL — ABNORMAL LOW (ref 30.0–100.0)

## 2018-08-06 LAB — VITAMIN B12: Vitamin B-12: 775 pg/mL (ref 232–1245)

## 2018-08-10 ENCOUNTER — Other Ambulatory Visit: Payer: Self-pay

## 2018-08-10 MED ORDER — VITAMIN D (ERGOCALCIFEROL) 1.25 MG (50000 UNIT) PO CAPS: 50000.0000 [IU] | ORAL_CAPSULE | ORAL | 0 refills | Status: DC

## 2018-08-10 NOTE — Telephone Encounter (Signed)
Medication sent to pharmacy  

## 2018-08-17 ENCOUNTER — Telehealth: Payer: Self-pay | Admitting: Cardiology

## 2018-08-17 NOTE — Telephone Encounter (Signed)
[facility] is requesting a new Rx to separate Losartan/HCTZ 100-25 mg tablets, this medication is on backorder, so pt can get her medications. Please address

## 2018-08-18 NOTE — Telephone Encounter (Signed)
OK to give losartan 100mg  PO QD and HCTZ 25mg  PO QD Thanks.  Candee Furbish, MD

## 2018-08-29 MED ORDER — LOSARTAN POTASSIUM 100 MG PO TABS: 100.0000 mg | ORAL_TABLET | Freq: Every day | ORAL | 2 refills | Status: DC

## 2018-08-29 MED ORDER — HYDROCHLOROTHIAZIDE 25 MG PO TABS: 25.0000 mg | ORAL_TABLET | Freq: Every day | ORAL | 2 refills | Status: DC

## 2018-09-08 ENCOUNTER — Other Ambulatory Visit: Payer: Self-pay | Admitting: Family Medicine

## 2018-09-13 ENCOUNTER — Ambulatory Visit: Payer: Managed Care, Other (non HMO) | Admitting: Cardiology

## 2018-10-03 ENCOUNTER — Encounter: Payer: Self-pay | Admitting: *Deleted

## 2018-10-03 ENCOUNTER — Encounter: Payer: Self-pay | Admitting: Cardiology

## 2018-10-03 ENCOUNTER — Ambulatory Visit (INDEPENDENT_AMBULATORY_CARE_PROVIDER_SITE_OTHER): Payer: Managed Care, Other (non HMO) | Admitting: Cardiology

## 2018-10-03 VITALS — BP 136/82 | HR 86 | Ht 68.0 in | Wt 308.1 lb

## 2018-10-03 DIAGNOSIS — I10 Essential (primary) hypertension: Secondary | ICD-10-CM

## 2018-10-03 DIAGNOSIS — Z9889 Other specified postprocedural states: Secondary | ICD-10-CM

## 2018-10-03 DIAGNOSIS — I428 Other cardiomyopathies: Secondary | ICD-10-CM | POA: Diagnosis not present

## 2018-10-03 DIAGNOSIS — E119 Type 2 diabetes mellitus without complications: Secondary | ICD-10-CM

## 2018-10-03 MED ORDER — METOPROLOL SUCCINATE ER 100 MG PO TB24: 100.0000 mg | ORAL_TABLET | Freq: Every day | ORAL | 3 refills | Status: DC

## 2018-10-03 NOTE — Progress Notes (Signed)
Cardiology Office Note:    Date:  10/03/2018   ID:  [name], DOB 05-31-1966, MRN [id number]  PCP:  Azzie Glatter, FNP  Cardiologist:  Candee Furbish, MD  Electrophysiologist:  None   Referring MD: Scot Jun, FNP     History of Present Illness:    Claudia [name] is a 53 y.o. female here for follow-up of moderately reduced LV systolic dysfunction EF 40 to 45% with hypertension diabetes cardiomyopathy, with echo improvement in 2016 and 2018.  Cardiomyopathy was thought to be in the setting of hypertensive urgency.  Back in March 2019 she saw Truitt Merle and she was "trying to get myself together ".  She was off Coreg for unknown reasons heart rate was over 100 this was restarted, previously on 37.5 twice daily.  She was frustrated with weight.  Back in July 2019 had chest pain had another cardiac catheterization showed normal coronary arteries and normal filling pressures.  In her last visit with Cecille Rubin on 06/15/2018 she was having no further chest pain little tired.  Trying to stay calm.  Overall she still states that she is having joint pains, muscle tightness, hand tightness when taking carvedilol.  This must of been the reason she stopped it previously.  We are switching the medication over to Toprol.  See below.  She is also trying to lose weight again.  Tells me that some of the medications are responsible for her continued weight gain.  Works for [employer], going to be going to [employer] for weight loss.  Denies any chest pain.  No significant shortness of breath.  Past Medical History:  Diagnosis Date  . Diabetes mellitus   . Fibroids   . Headache(784.0)   . History of echocardiogram    Echo 7/18: Moderate concentric LVH, EF 55-60, normal wall motion, grade 1 diastolic dysfunction, calcified aortic leaflets  . Hypercholesterolemia   . Hyperlipidemia   . Hypertension   . Osteoarthritis    , with mild meniscus tear, followed by  orthopedics--Dr Gladstone Lighter 04/29/10  . Right knee pain   . Seizures ([place])    as child  . Vitamin D deficiency     Past Surgical History:  Procedure Laterality Date  . ANTERIOR CERVICAL DECOMP/DISCECTOMY FUSION N/A 09/07/2013   Procedure: ANTERIOR CERVICAL DECOMPRESSION/DISCECTOMY FUSION 1 LEVEL;  Surgeon: Sinclair Ship, MD;  Location: [facility];  Service: Orthopedics;  Laterality: N/A;  Anterior cervical decompression fusion, cervical 5-6 with instrumentation and allograft  . LAPAROSCOPIC ASSISTED VAGINAL HYSTERECTOMY  1/14  . LEFT HEART CATH AND CORONARY ANGIOGRAPHY N/A 04/25/2018   Procedure: LEFT HEART CATH AND CORONARY ANGIOGRAPHY;  Surgeon: Lorretta Harp, MD;  Location: [facility];  Service: Cardiovascular;  Laterality: N/A;  . SHOULDER SURGERY Right 11   rotator cuff  . TUBAL LIGATION    . UMBILICAL HERNIA REPAIR      Current Medications: Current Meds  Medication Sig  . amLODipine (NORVASC) 5 MG tablet Take 1 tablet (5 mg total) by mouth daily.  Marland Kitchen aspirin EC 81 MG EC tablet Take 1 tablet (81 mg total) by mouth daily.  . ferrous sulfate 325 (65 FE) MG tablet Take 325 mg by mouth 2 (two) times daily.  Marland Kitchen glipiZIDE (GLUCOTROL) 5 MG tablet TAKE 1 TABLET BY MOUTH TWICE DAILY BEFORE A MEAL  . hydrALAZINE (APRESOLINE) 25 MG tablet Take 1 tablet (25 mg total) by mouth every 8 (eight) hours.  . hydrochlorothiazide (HYDRODIURIL) 25 MG tablet  Take 1 tablet (25 mg total) by mouth daily.  Marland Kitchen losartan (COZAAR) 100 MG tablet Take 1 tablet (100 mg total) by mouth daily.  . metFORMIN (GLUCOPHAGE) 1000 MG tablet Take 1 tablet (1,000 mg total) by mouth 2 (two) times daily with a meal.  . nitroGLYCERIN (NITROSTAT) 0.4 MG SL tablet Place 1 tablet (0.4 mg total) under the tongue every 5 (five) minutes x 3 doses as needed for chest pain.  . Omega-3 Fatty Acids (FISH OIL) 1000 MG CAPS Take by mouth.  . sitaGLIPtin (JANUVIA) 25 MG tablet Take 1 tablet (25 mg total) by mouth daily.  Marland Kitchen  spironolactone (ALDACTONE) 25 MG tablet Take 1 tablet (25 mg total) by mouth daily.  . Vitamin D, Ergocalciferol, (DRISDOL) 1.25 MG (50000 UT) CAPS capsule Take 1 capsule (50,000 Units total) by mouth every 7 (seven) days.  . [DISCONTINUED] carvedilol (COREG) 12.5 MG tablet Take 1 tablet (12.5 mg total) by mouth 2 (two) times daily.     Allergies:   Crestor [rosuvastatin]; Hydrocodone; Other; Oxycodone; Pravachol [pravastatin sodium]; and Simvastatin   Social History   Socioeconomic History  . Marital status: Married    Spouse name: Not on file  . Number of children: Not on file  . Years of education: Not on file  . Highest education level: Not on file  Occupational History  . Not on file  Social Needs  . Financial resource strain: Not on file  . Food insecurity:    Worry: Not on file    Inability: Not on file  . Transportation needs:    Medical: Not on file    Non-medical: Not on file  Tobacco Use  . Smoking status: Never Smoker  . Smokeless tobacco: Never Used  Substance and Sexual Activity  . Alcohol use: No  . Drug use: No  . Sexual activity: Not on file  Lifestyle  . Physical activity:    Days per week: Not on file    Minutes per session: Not on file  . Stress: Not on file  Relationships  . Social connections:    Talks on phone: Not on file    Gets together: Not on file    Attends religious service: Not on file    Active member of club or organization: Not on file    Attends meetings of clubs or organizations: Not on file    Relationship status: Not on file  Other Topics Concern  . Not on file  Social History Narrative  . Not on file     Family History: The patient's family history includes CVA in her maternal grandmother; Diabetes in her maternal grandmother and mother; Hypercholesterolemia in her mother; Hypertension in her maternal grandmother, mother, and sister. There is no history of Heart attack.  ROS:   Please see the history of present illness.      Denies any fevers chills nausea vomiting syncope bleeding all other systems reviewed and are negative.  EKGs/Labs/Other Studies Reviewed:    The following studies were reviewed today:  Echo Study Conclusions 03/2018  - Left ventricle: The cavity size was normal. There was mild concentric hypertrophy. Systolic function was normal. The estimated ejection fraction was in the range of 55% to 60%. Wall motion was normal; there were no regional wall motion abnormalities. Left ventricular diastolic function parameters were normal. - Aortic valve: Valve area (VTI): 2.33 cm^2. Valve area (Vmax): 2.23 cm^2. Valve area (Vmean): 2.03 cm^2. - Left atrium: The atrium was mildly dilated. - Pulmonic valve:  There was trivial regurgitation. - Pulmonary arteries: Systolic pressure could not be accurately estimated.  Cardiac Cath 03/2018 IMPRESSION:Ms. Charon has normal coronary arteries and normal filling pressures. I believe her chest pain is noncardiac. 2D echo is pending for LV function. Medical therapy for noncardiac chest pain will be recommended. The sheath was removed and a TR band was placed on the right wrist to achieve patent hemostasis. The patient left the lab in stable condition.  Quay Burow. MD, [facility]  EKG:  EKG is not ordered today.  Prior EKG unremarkable  Recent Labs: 08/05/2018: ALT 16; BUN 10; Creatinine, Ser 0.69; Hemoglobin 11.7; Platelets 423; Potassium 4.4; Sodium 139; TSH 0.392  Recent Lipid Panel    Component Value Date/Time   CHOL 177 08/05/2018 0917   TRIG 67 08/05/2018 0917   HDL 52 08/05/2018 0917   CHOLHDL 3.4 08/05/2018 0917   CHOLHDL 3.8 02/25/2017 1536   VLDL 25 02/25/2017 1536   LDLCALC 112 (H) 08/05/2018 0917    Physical Exam:    VS:  BP 136/82   Pulse 86   Ht 5\' 8"  (1.727 m)   Wt (!) 308 lb 1.9 oz (139.8 kg)   LMP 05/09/2012   SpO2 94%   BMI 46.85 kg/m     Wt Readings from Last 3 Encounters:  10/03/18 (!) 308 lb 1.9 oz  (139.8 kg)  08/05/18 (!) 309 lb (140.2 kg)  07/01/18 (!) 304 lb (137.9 kg)     GEN: Obese Well nourished, well developed in no acute distress HEENT: Normal NECK: No JVD; No carotid bruits LYMPHATICS: No lymphadenopathy CARDIAC: RRR, no murmurs, rubs, gallops RESPIRATORY:  Clear to auscultation without rales, wheezing or rhonchi  ABDOMEN: Soft, non-tender, non-distended MUSCULOSKELETAL:  No edema; No deformity  SKIN: Warm and dry NEUROLOGIC:  Alert and oriented x 3 PSYCHIATRIC:  Normal affect   ASSESSMENT:    1. NICM (nonischemic cardiomyopathy) ([place])   2. S/P cardiac catheterization   3. Morbid obesity due to excess calories ([place])   4. Essential hypertension   5. Diabetes mellitus with coincident hypertension ([place])    PLAN:    In order of problems listed above:  Nonischemic cardiomyopathy - Resolved.  Continue to control blood pressure, likely because of prior cardiomyopathy with EF 40 to 45%. -She is saying that she is having joint pain with carvedilol.  Essential hypertension - Medications reviewed.  Last addition was hydralazine. -She stating that she is having tightness in her hands and joints from taking carvedilol.  I explained that this would be very unusual for this medication but she has experimented with it and is convinced that this is the case.  We will go ahead and switch her off of the carvedilol 12.5 twice a day and put her on Toprol XL 100 mg once a day.  Hyperlipidemia -Had statin intolerance, pleased with, no significant coronary artery disease  Diabetes with hypertension - Cost compliance. A1c. [employer]  Morbid obesity -Continues to struggle with this.  Continue with weight loss.  Decrease carbohydrates.  1 yr   Medication Adjustments/Labs and Tests Ordered: Current medicines are reviewed at length with the patient today.  Concerns regarding medicines are outlined above.  No orders of the defined types were placed in this encounter.  Meds ordered  this encounter  Medications  . metoprolol succinate (TOPROL-XL) 100 MG 24 hr tablet    Sig: Take 1 tablet (100 mg total) by mouth daily. Take with or immediately following a meal.    Dispense:  90 tablet    Refill:  3    Patient Instructions  Medication Instructions:  Please discontinue your Carvedilol and start Metoprolol Succinate 100 mg daily. Continue all other medications as listed.  If you need a refill on your cardiac medications before your next appointment, please call your pharmacy.   Follow-Up: At [facility], you and your health needs are our priority.  As part of our continuing mission to provide you with exceptional heart care, we have created designated Provider Care Teams.  These Care Teams include your primary Cardiologist (physician) and Advanced Practice Providers (APPs -  Physician Assistants and Nurse Practitioners) who all work together to provide you with the care you need, when you need it. You will need a follow up appointment in 12 months.  Please call our office 2 months in advance to schedule this appointment.  You may see Candee Furbish, MD or one of the following Advanced Practice Providers on your designated Care Team:   Truitt Merle, NP Cecilie Kicks, NP . Kathyrn Drown, NP  Thank you for choosing [facility]!!         Signed, Candee Furbish, MD  10/03/2018 8:49 AM    [place]

## 2018-10-03 NOTE — Patient Instructions (Signed)
Medication Instructions:  Please discontinue your Carvedilol and start Metoprolol Succinate 100 mg daily. Continue all other medications as listed.  If you need a refill on your cardiac medications before your next appointment, please call your pharmacy.   Follow-Up: At [facility], you and your health needs are our priority.  As part of our continuing mission to provide you with exceptional heart care, we have created designated Provider Care Teams.  These Care Teams include your primary Cardiologist (physician) and Advanced Practice Providers (APPs -  Physician Assistants and Nurse Practitioners) who all work together to provide you with the care you need, when you need it. You will need a follow up appointment in 12 months.  Please call our office 2 months in advance to schedule this appointment.  You may see Candee Furbish, MD or one of the following Advanced Practice Providers on your designated Care Team:   Truitt Merle, NP Cecilie Kicks, NP . Kathyrn Drown, NP  Thank you for choosing [facility]!!

## 2018-10-28 ENCOUNTER — Other Ambulatory Visit: Payer: Self-pay

## 2018-10-28 DIAGNOSIS — E1159 Type 2 diabetes mellitus with other circulatory complications: Secondary | ICD-10-CM

## 2018-10-28 MED ORDER — SITAGLIPTIN PHOSPHATE 25 MG PO TABS: 25.0000 mg | ORAL_TABLET | Freq: Every day | ORAL | 2 refills | Status: DC

## 2018-10-28 NOTE — Telephone Encounter (Signed)
Medication sent to pharmacy  

## 2018-11-07 ENCOUNTER — Encounter: Payer: Self-pay | Admitting: Family Medicine

## 2018-11-07 ENCOUNTER — Ambulatory Visit (INDEPENDENT_AMBULATORY_CARE_PROVIDER_SITE_OTHER): Payer: Managed Care, Other (non HMO) | Admitting: Family Medicine

## 2018-11-07 VITALS — BP 140/82 | HR 78 | Temp 97.8°F | Ht 68.0 in | Wt 307.0 lb

## 2018-11-07 DIAGNOSIS — R0602 Shortness of breath: Secondary | ICD-10-CM | POA: Diagnosis not present

## 2018-11-07 DIAGNOSIS — M549 Dorsalgia, unspecified: Secondary | ICD-10-CM

## 2018-11-07 DIAGNOSIS — E1159 Type 2 diabetes mellitus with other circulatory complications: Secondary | ICD-10-CM

## 2018-11-07 DIAGNOSIS — I252 Old myocardial infarction: Secondary | ICD-10-CM | POA: Diagnosis not present

## 2018-11-07 DIAGNOSIS — Z09 Encounter for follow-up examination after completed treatment for conditions other than malignant neoplasm: Secondary | ICD-10-CM

## 2018-11-07 DIAGNOSIS — I10 Essential (primary) hypertension: Secondary | ICD-10-CM

## 2018-11-07 DIAGNOSIS — R0609 Other forms of dyspnea: Secondary | ICD-10-CM

## 2018-11-07 DIAGNOSIS — E66813 Obesity, class 3: Secondary | ICD-10-CM

## 2018-11-07 DIAGNOSIS — R0600 Dyspnea, unspecified: Secondary | ICD-10-CM

## 2018-11-07 DIAGNOSIS — Z6841 Body Mass Index (BMI) 40.0 and over, adult: Secondary | ICD-10-CM

## 2018-11-07 LAB — POCT URINALYSIS DIP (MANUAL ENTRY)
Bilirubin, UA: NEGATIVE
Blood, UA: NEGATIVE
Glucose, UA: NEGATIVE mg/dL
Ketones, POC UA: NEGATIVE mg/dL
Leukocytes, UA: NEGATIVE
Nitrite, UA: NEGATIVE
Protein Ur, POC: NEGATIVE mg/dL
Spec Grav, UA: 1.025 (ref 1.010–1.025)
Urobilinogen, UA: 1 E.U./dL
pH, UA: 6 (ref 5.0–8.0)

## 2018-11-07 LAB — POCT GLYCOSYLATED HEMOGLOBIN (HGB A1C): Hemoglobin A1C: 8.1 % — AB (ref 4.0–5.6)

## 2018-11-07 MED ORDER — METFORMIN HCL ER 500 MG PO TB24: 500.0000 mg | ORAL_TABLET | Freq: Every day | ORAL | 3 refills | Status: DC

## 2018-11-07 MED ORDER — ALBUTEROL SULFATE HFA 108 (90 BASE) MCG/ACT IN AERS: 2.0000 | INHALATION_SPRAY | RESPIRATORY_TRACT | 6 refills | Status: DC | PRN

## 2018-11-07 NOTE — Progress Notes (Signed)
Patient [facility]  Established Patient Office Visit  Subjective:  Patient ID: Claudia [name], female    DOB: 21-Apr-1966  Age: 53 y.o. MRN: [id number]  CC:  Chief Complaint  Patient presents with  . Follow-up    chronic condition     HPI Claudia [name] is a 53 year old female who presents for follow up.   Past Medical History:  Diagnosis Date  . Diabetes mellitus   . Fibroids   . Headache(784.0)   . History of echocardiogram    Echo 7/18: Moderate concentric LVH, EF 55-60, normal wall motion, grade 1 diastolic dysfunction, calcified aortic leaflets  . Hypercholesterolemia   . Hyperlipidemia   . Hypertension   . Osteoarthritis    , with mild meniscus tear, followed by orthopedics--Dr Gladstone Lighter 04/29/10  . Right knee pain   . Seizures ([place])    as child  . Vitamin D deficiency    Current Status: Since her last office visit, she is doing well with no complaints. She recently discontinued Carvedilol, per Cardiologist. She has been practicing lifestyle changes and healthy eating habits towards her goal of weight loss. She is recently getting over common cold symptoms. She has been having moderate anxiety, based on family situations. She denies suicidal ideations, homicidal ideations, or auditory hallucinations. She is accompanied by her husband today. She denies visual changes, chest pain, cough, shortness of breath, heart palpitations, and falls. She has occasional headaches and dizziness with position changes. Denies severe headaches, confusion, seizures, double vision, and blurred vision, nausea and vomiting.  She denies fevers, chills, fatigue, weight loss, and night sweats. She has not had any falls. No reports of GI problems such as  diarrhea, and constipation. She has no reports of blood in stools, dysuria and hematuria.  Past Surgical History:  Procedure Laterality Date  . ANTERIOR CERVICAL DECOMP/DISCECTOMY FUSION N/A 09/07/2013   Procedure: ANTERIOR CERVICAL DECOMPRESSION/DISCECTOMY FUSION 1 LEVEL;  Surgeon: Sinclair Ship, MD;  Location: [facility];  Service: Orthopedics;  Laterality: N/A;  Anterior cervical decompression fusion, cervical 5-6 with instrumentation and allograft  . LAPAROSCOPIC ASSISTED VAGINAL HYSTERECTOMY  1/14  . LEFT HEART CATH AND CORONARY ANGIOGRAPHY N/A 04/25/2018   Procedure: LEFT HEART CATH AND CORONARY ANGIOGRAPHY;  Surgeon: Lorretta Harp, MD;  Location: [facility];  Service: Cardiovascular;  Laterality: N/A;  . SHOULDER SURGERY Right 11   rotator cuff  . TUBAL LIGATION    . UMBILICAL HERNIA REPAIR     Family History  Problem Relation Age of Onset  . Hypertension Mother   . Diabetes Mother   . Hypercholesterolemia Mother   . Hypertension Sister   . CVA Maternal Grandmother   . Hypertension Maternal Grandmother   . Diabetes Maternal Grandmother   . Heart attack Neg Hx     Social History   Socioeconomic History  . Marital status: Married    Spouse name: Not on file  . Number of children: Not on file  . Years of education: Not on file  . Highest education level: Not on file  Occupational History  . Not on file  Social Needs  . Financial resource strain: Not on file  . Food insecurity:    Worry: Not on file    Inability: Not on file  . Transportation needs:    Medical: Not on file    Non-medical: Not on file  Tobacco Use  . Smoking status: Never Smoker  . Smokeless tobacco:  Never Used  Substance and Sexual Activity  . Alcohol use: No  . Drug use: No  . Sexual activity: Not on file  Lifestyle  . Physical activity:    Days per week: Not on file    Minutes per session: Not on file  . Stress: Not on file  Relationships  . Social connections:    Talks on phone: Not on file    Gets together: Not on file    Attends religious service: Not on file    Active member of club or organization: Not on file    Attends meetings of clubs or organizations: Not on file      Relationship status: Not on file  . Intimate partner violence:    Fear of current or ex partner: Not on file    Emotionally abused: Not on file    Physically abused: Not on file    Forced sexual activity: Not on file  Other Topics Concern  . Not on file  Social History Narrative  . Not on file    Outpatient Medications Prior to Visit  Medication Sig Dispense Refill  . amLODipine (NORVASC) 5 MG tablet Take 1 tablet (5 mg total) by mouth daily. 90 tablet 3  . aspirin EC 81 MG EC tablet Take 1 tablet (81 mg total) by mouth daily.    . ferrous sulfate 325 (65 FE) MG tablet Take 325 mg by mouth 2 (two) times daily.    Marland Kitchen glipiZIDE (GLUCOTROL) 5 MG tablet TAKE 1 TABLET BY MOUTH TWICE DAILY BEFORE A MEAL 60 tablet 1  . hydrALAZINE (APRESOLINE) 25 MG tablet Take 1 tablet (25 mg total) by mouth every 8 (eight) hours. 180 tablet 4  . hydrochlorothiazide (HYDRODIURIL) 25 MG tablet Take 1 tablet (25 mg total) by mouth daily. 90 tablet 2  . losartan (COZAAR) 100 MG tablet Take 1 tablet (100 mg total) by mouth daily. 90 tablet 2  . metoprolol succinate (TOPROL-XL) 100 MG 24 hr tablet Take 1 tablet (100 mg total) by mouth daily. Take with or immediately following a meal. 90 tablet 3  . nitroGLYCERIN (NITROSTAT) 0.4 MG SL tablet Place 1 tablet (0.4 mg total) under the tongue every 5 (five) minutes x 3 doses as needed for chest pain. 25 tablet 6  . Omega-3 Fatty Acids (FISH OIL) 1000 MG CAPS Take by mouth.    . sitaGLIPtin (JANUVIA) 25 MG tablet Take 1 tablet (25 mg total) by mouth daily. 30 tablet 2  . spironolactone (ALDACTONE) 25 MG tablet Take 1 tablet (25 mg total) by mouth daily. 90 tablet 1  . Vitamin D, Ergocalciferol, (DRISDOL) 1.25 MG (50000 UT) CAPS capsule Take 1 capsule (50,000 Units total) by mouth every 7 (seven) days. 30 capsule 0  . metFORMIN (GLUCOPHAGE) 1000 MG tablet Take 1 tablet (1,000 mg total) by mouth 2 (two) times daily with a meal. 60 tablet 1   No facility-administered  medications prior to visit.     Allergies  Allergen Reactions  . Crestor [Rosuvastatin]     MUSCLE ACHES HEADACHES   . Hydrocodone Itching  . Other Hives, Swelling and Other (See Comments)    Vision changes; ALLERGIC TO ALL NUTS EXCEPT PEANUTS  . Oxycodone Itching  . Pravachol [Pravastatin Sodium] Itching    Joint pain  . Simvastatin Hives    ROS Review of Systems  Constitutional: Negative.   HENT: Negative.   Eyes: Negative.   Respiratory: Positive for shortness of breath (on exertion ).  Gastrointestinal: Positive for abdominal distention (Obese).  Endocrine: Negative.   Musculoskeletal: Positive for back pain (r/t large breasts).  Skin: Negative.   Neurological: Positive for dizziness and headaches.  Hematological: Negative.   Psychiatric/Behavioral: Negative.    Objective:    Physical Exam  Constitutional: She is oriented to person, place, and time. She appears well-developed and well-nourished.  HENT:  Head: Normocephalic and atraumatic.  Eyes: Conjunctivae are normal.  Neck: Normal range of motion. Neck supple.  Cardiovascular: Normal rate, regular rhythm, normal heart sounds and intact distal pulses.  Pulmonary/Chest: Effort normal and breath sounds normal.  Abdominal: Soft. Bowel sounds are normal. She exhibits distension (obese).  Musculoskeletal: Normal range of motion.  Neurological: She is alert and oriented to person, place, and time. She has normal reflexes.  Skin: Skin is warm and dry.  Psychiatric: She has a normal mood and affect. Her behavior is normal. Judgment and thought content normal.  Nursing note and vitals reviewed.  BP 140/82   Pulse 78   Temp 97.8 F (36.6 C) (Oral)   Ht 5\' 8"  (1.727 m)   Wt (!) 307 lb (139.3 kg)   LMP 05/09/2012   SpO2 98%   BMI 46.68 kg/m  Wt Readings from Last 3 Encounters:  11/07/18 (!) 307 lb (139.3 kg)  10/03/18 (!) 308 lb 1.9 oz (139.8 kg)  08/05/18 (!) 309 lb (140.2 kg)     Health Maintenance Due    Topic Date Due  . PAP SMEAR-Modifier  06/17/2015  . MAMMOGRAM  08/26/2017  . COLONOSCOPY  08/26/2017  . FOOT EXAM  02/25/2018  . INFLUENZA VACCINE  04/28/2018  . OPHTHALMOLOGY EXAM  07/13/2018    There are no preventive care reminders to display for this patient.  Lab Results  Component Value Date   TSH 0.392 (L) 08/05/2018   Lab Results  Component Value Date   WBC 5.4 08/05/2018   HGB 11.7 08/05/2018   HCT 38.1 08/05/2018   MCV 72 (L) 08/05/2018   PLT 423 08/05/2018   Lab Results  Component Value Date   NA 139 08/05/2018   K 4.4 08/05/2018   CO2 27 08/05/2018   GLUCOSE 148 (H) 08/05/2018   BUN 10 08/05/2018   CREATININE 0.69 08/05/2018   BILITOT 0.5 08/05/2018   ALKPHOS 46 08/05/2018   AST 13 08/05/2018   ALT 16 08/05/2018   PROT 7.4 08/05/2018   ALBUMIN 4.2 08/05/2018   CALCIUM 9.5 08/05/2018   ANIONGAP 7 04/26/2018   GFR 143.59 03/16/2014   Lab Results  Component Value Date   CHOL 177 08/05/2018   Lab Results  Component Value Date   HDL 52 08/05/2018   Lab Results  Component Value Date   LDLCALC 112 (H) 08/05/2018   Lab Results  Component Value Date   TRIG 67 08/05/2018   Lab Results  Component Value Date   CHOLHDL 3.4 08/05/2018   Lab Results  Component Value Date   HGBA1C 8.1 (A) 11/07/2018   Assessment & Plan:   1. Type 2 diabetes mellitus with other circulatory complication, unspecified whether long term insulin use (HCC) Improved. Hgb A1c is decreased at 8.1, from 8.8 on 07/01/2018. Continue Metformin and Januvia. She will continue to decrease foods/beverages high in sugars and carbs and follow Heart Healthy or DASH diet. Increase physical activity to at least 30 minutes cardio exercise daily.  - POCT glycosylated hemoglobin (Hb A1C) - POCT urinalysis dipstick - metFORMIN (GLUCOPHAGE XR) 500 MG 24 hr tablet; Take 1  tablet (500 mg total) by mouth daily with breakfast.  Dispense: 30 tablet; Refill: 3  2. Hx of non-ST elevation myocardial  infarction (NSTEMI) Stable. No signs and symptoms of recurrence.   3. Essential hypertension Blood pressure is stable at 140/82 today. Continue Amlodipine, Hydralazine, and Metoprolol as prescribed. She will continue to decrease high sodium intake, excessive alcohol intake, increase potassium intake, smoking cessation, and increase physical activity of at least 30 minutes of cardio activity daily. She will continue to follow Heart Healthy or DASH diet.  4. Shortness of breath - albuterol (PROVENTIL HFA;VENTOLIN HFA) 108 (90 Base) MCG/ACT inhaler; Inhale 2 puffs into the lungs every 4 (four) hours as needed for wheezing or shortness of breath (cough, shortness of breath or wheezing.).  Dispense: 1 Inhaler; Refill: 6  5. Dyspnea on exertion - albuterol (PROVENTIL HFA;VENTOLIN HFA) 108 (90 Base) MCG/ACT inhaler; Inhale 2 puffs into the lungs every 4 (four) hours as needed for wheezing or shortness of breath (cough, shortness of breath or wheezing.).  Dispense: 1 Inhaler; Refill: 6  6. Class 3 severe obesity due to excess calories with serious comorbidity and body mass index (BMI) of 45.0 to 49.9 in adult [facility]) Body mass index is 46.68 kg/m. Goal BMI  is <30. Encouraged efforts to reduce weight include engaging in physical activity as tolerated with goal of 150 minutes per week. Improve dietary choices and eat a meal regimen consistent with a Mediterranean or DASH diet. Reduce simple carbohydrates. Do not skip meals and eat healthy snacks throughout the day to avoid over-eating at dinner. Set a goal weight loss that is achievable for you.  7. Back pain Relates back pain to heavy breasts. After weight loss we will research steps for possible breast reduction, with back x-ray and referral to breast center for further evaluation.   8. Follow up She will follow up in 3 months.   Meds ordered this encounter  Medications  . metFORMIN (GLUCOPHAGE XR) 500 MG 24 hr tablet    Sig: Take 1 tablet (500 mg  total) by mouth daily with breakfast.    Dispense:  30 tablet    Refill:  3  . albuterol (PROVENTIL HFA;VENTOLIN HFA) 108 (90 Base) MCG/ACT inhaler    Sig: Inhale 2 puffs into the lungs every 4 (four) hours as needed for wheezing or shortness of breath (cough, shortness of breath or wheezing.).    Dispense:  1 Inhaler    Refill:  6   Referral Orders  No referral(s) requested today   Kathe Becton,  MSN, FNP-C Patient [place]  [place], [place] [address] [phone]    Problem List Items Addressed This Visit      Cardiovascular and Mediastinum   HTN (hypertension)     Endocrine   DM (diabetes mellitus) ([place]) - Primary   Relevant Medications   metFORMIN (GLUCOPHAGE XR) 500 MG 24 hr tablet   Other Relevant Orders   POCT glycosylated hemoglobin (Hb A1C) (Completed)   POCT urinalysis dipstick (Completed)     Other   Dyspnea on exertion   Relevant Medications   albuterol (PROVENTIL HFA;VENTOLIN HFA) 108 (90 Base) MCG/ACT inhaler    Other Visit Diagnoses    Hx of non-ST elevation myocardial infarction (NSTEMI)       Shortness of breath       Relevant Medications   albuterol (PROVENTIL HFA;VENTOLIN HFA) 108 (90 Base) MCG/ACT inhaler   Class 3 severe obesity due to excess calories  with serious comorbidity and body mass index (BMI) of 45.0 to 49.9 in adult [facility])       Relevant Medications   metFORMIN (GLUCOPHAGE XR) 500 MG 24 hr tablet   Follow up          Meds ordered this encounter  Medications  . metFORMIN (GLUCOPHAGE XR) 500 MG 24 hr tablet    Sig: Take 1 tablet (500 mg total) by mouth daily with breakfast.    Dispense:  30 tablet    Refill:  3  . albuterol (PROVENTIL HFA;VENTOLIN HFA) 108 (90 Base) MCG/ACT inhaler    Sig: Inhale 2 puffs into the lungs every 4 (four) hours as needed for wheezing or shortness of breath (cough, shortness of breath or wheezing.).    Dispense:  1 Inhaler    Refill:  6    Follow-up:  Return in about 3 months (around 02/05/2019).    Azzie Glatter, FNP

## 2019-02-03 ENCOUNTER — Other Ambulatory Visit: Payer: Self-pay

## 2019-02-03 MED ORDER — GLIPIZIDE 5 MG PO TABS: ORAL_TABLET | ORAL | 1 refills | Status: DC

## 2019-02-03 NOTE — Telephone Encounter (Signed)
Medication sent to pharmacy  

## 2019-02-06 ENCOUNTER — Ambulatory Visit: Payer: Managed Care, Other (non HMO) | Admitting: Family Medicine

## 2019-02-06 ENCOUNTER — Other Ambulatory Visit: Payer: Self-pay | Admitting: Family Medicine

## 2019-02-06 ENCOUNTER — Telehealth: Payer: Self-pay | Admitting: Family Medicine

## 2019-02-06 DIAGNOSIS — E1159 Type 2 diabetes mellitus with other circulatory complications: Secondary | ICD-10-CM

## 2019-02-06 NOTE — Telephone Encounter (Signed)
Attempted to contact patient today, as she was scheduled for follow up at 9:00 am in our office today. Message left on her voicemail to contact our office if she would like to schedule a Telephone Virtual Visit today.

## 2019-02-10 ENCOUNTER — Other Ambulatory Visit: Payer: Self-pay | Admitting: Family Medicine

## 2019-02-10 DIAGNOSIS — E1159 Type 2 diabetes mellitus with other circulatory complications: Secondary | ICD-10-CM

## 2019-02-15 ENCOUNTER — Other Ambulatory Visit: Payer: Self-pay

## 2019-02-15 ENCOUNTER — Encounter: Payer: Self-pay | Admitting: Family Medicine

## 2019-02-15 ENCOUNTER — Ambulatory Visit (INDEPENDENT_AMBULATORY_CARE_PROVIDER_SITE_OTHER): Payer: Managed Care, Other (non HMO) | Admitting: Family Medicine

## 2019-02-15 VITALS — BP 136/78 | HR 82 | Temp 97.9°F | Ht 68.0 in | Wt 312.0 lb

## 2019-02-15 DIAGNOSIS — I10 Essential (primary) hypertension: Secondary | ICD-10-CM

## 2019-02-15 DIAGNOSIS — E1159 Type 2 diabetes mellitus with other circulatory complications: Secondary | ICD-10-CM | POA: Diagnosis not present

## 2019-02-15 DIAGNOSIS — R0609 Other forms of dyspnea: Secondary | ICD-10-CM

## 2019-02-15 DIAGNOSIS — R0600 Dyspnea, unspecified: Secondary | ICD-10-CM

## 2019-02-15 DIAGNOSIS — R0602 Shortness of breath: Secondary | ICD-10-CM

## 2019-02-15 DIAGNOSIS — Z6841 Body Mass Index (BMI) 40.0 and over, adult: Secondary | ICD-10-CM

## 2019-02-15 DIAGNOSIS — I252 Old myocardial infarction: Secondary | ICD-10-CM | POA: Diagnosis not present

## 2019-02-15 DIAGNOSIS — Z09 Encounter for follow-up examination after completed treatment for conditions other than malignant neoplasm: Secondary | ICD-10-CM

## 2019-02-15 DIAGNOSIS — R739 Hyperglycemia, unspecified: Secondary | ICD-10-CM | POA: Diagnosis not present

## 2019-02-15 DIAGNOSIS — E66813 Obesity, class 3: Secondary | ICD-10-CM

## 2019-02-15 LAB — POCT GLYCOSYLATED HEMOGLOBIN (HGB A1C): Hemoglobin A1C: 11.1 % — AB (ref 4.0–5.6)

## 2019-02-15 LAB — POCT URINALYSIS DIP (MANUAL ENTRY)
Bilirubin, UA: NEGATIVE
Blood, UA: NEGATIVE
Glucose, UA: 250 mg/dL — AB
Ketones, POC UA: NEGATIVE mg/dL
Leukocytes, UA: NEGATIVE
Nitrite, UA: NEGATIVE
Protein Ur, POC: NEGATIVE mg/dL
Spec Grav, UA: 1.02 (ref 1.010–1.025)
Urobilinogen, UA: 0.2 E.U./dL
pH, UA: 6 (ref 5.0–8.0)

## 2019-02-15 MED ORDER — HYDROCHLOROTHIAZIDE 25 MG PO TABS: 25.0000 mg | ORAL_TABLET | Freq: Every day | ORAL | 1 refills | Status: DC

## 2019-02-15 MED ORDER — METOPROLOL SUCCINATE ER 100 MG PO TB24: 100.0000 mg | ORAL_TABLET | Freq: Every day | ORAL | 1 refills | Status: DC

## 2019-02-15 MED ORDER — NITROGLYCERIN 0.4 MG SL SUBL: 0.4000 mg | SUBLINGUAL_TABLET | SUBLINGUAL | 6 refills | Status: DC | PRN

## 2019-02-15 MED ORDER — SITAGLIPTIN PHOSPHATE 25 MG PO TABS: 25.0000 mg | ORAL_TABLET | Freq: Every day | ORAL | 3 refills | Status: DC

## 2019-02-15 MED ORDER — VITAMIN D (ERGOCALCIFEROL) 1.25 MG (50000 UNIT) PO CAPS: 50000.0000 [IU] | ORAL_CAPSULE | ORAL | 3 refills | Status: DC

## 2019-02-15 MED ORDER — METFORMIN HCL ER 500 MG PO TB24: 500.0000 mg | ORAL_TABLET | Freq: Every day | ORAL | 3 refills | Status: DC

## 2019-02-15 MED ORDER — SPIRONOLACTONE 25 MG PO TABS: 25.0000 mg | ORAL_TABLET | Freq: Every day | ORAL | 1 refills | Status: DC

## 2019-02-15 MED ORDER — HYDRALAZINE HCL 25 MG PO TABS: 25.0000 mg | ORAL_TABLET | Freq: Three times a day (TID) | ORAL | 2 refills | Status: DC

## 2019-02-15 MED ORDER — LOSARTAN POTASSIUM 100 MG PO TABS: 100.0000 mg | ORAL_TABLET | Freq: Every day | ORAL | 1 refills | Status: DC

## 2019-02-15 NOTE — Progress Notes (Signed)
[name]   Established Patient Office Visit  Subjective:  Patient ID: Claudia [name], female    DOB: 05/16/1966  Age: 53 y.o. MRN: [id number]  CC:  Chief Complaint  Patient presents with  . Follow-up    chronic conditon     HPI Claudia [name] is a 53 year female who presents for follow up today.   Past Medical History:  Diagnosis Date  . Diabetes mellitus   . Fibroids   . Headache(784.0)   . History of echocardiogram    Echo 7/18: Moderate concentric LVH, EF 55-60, normal wall motion, grade 1 diastolic dysfunction, calcified aortic leaflets  . Hypercholesterolemia   . Hyperlipidemia   . Hypertension   . Osteoarthritis    , with mild meniscus tear, followed by orthopedics--Dr Gladstone Lighter 04/29/10  . Right knee pain   . Seizures ([place])    as child  . Vitamin D deficiency    Current Status: Since her last office visit, she is doing well with no complaints. He denies visual changes, chest pain, cough, shortness of breath, heart palpitations, and falls. She has occasional headaches and dizziness with position changes. Denies severe headaches, confusion, seizures, double vision, and blurred vision, nausea and vomiting.  Her most recent normal range of preprandial blood glucose levels have been between.  She denies fatigue, frequent urination, blurred vision, excessive hunger, excessive thirst, weight gain, weight loss, and poor wound healing. She continues to check his feet regularly. Her headaches are stable lately. She takes OTC pain medication for relief. Her anxiety is mild today. She denies suicidal ideations, homicidal ideations, or auditory hallucinations.  She denies fevers, chills, fatigue, recent infections, weight loss, and night sweats. No reports of GI problems such as diarrhea, and constipation. She has no reports of blood in stools, dysuria and hematuria. She denies pain today.    Past Surgical History:  Procedure  Laterality Date  . ANTERIOR CERVICAL DECOMP/DISCECTOMY FUSION N/A 09/07/2013   Procedure: ANTERIOR CERVICAL DECOMPRESSION/DISCECTOMY FUSION 1 LEVEL;  Surgeon: Sinclair Ship, MD;  Location: [facility];  Service: Orthopedics;  Laterality: N/A;  Anterior cervical decompression fusion, cervical 5-6 with instrumentation and allograft  . LAPAROSCOPIC ASSISTED VAGINAL HYSTERECTOMY  1/14  . LEFT HEART CATH AND CORONARY ANGIOGRAPHY N/A 04/25/2018   Procedure: LEFT HEART CATH AND CORONARY ANGIOGRAPHY;  Surgeon: Lorretta Harp, MD;  Location: [facility];  Service: Cardiovascular;  Laterality: N/A;  . SHOULDER SURGERY Right 11   rotator cuff  . TUBAL LIGATION    . UMBILICAL HERNIA REPAIR      Family History  Problem Relation Age of Onset  . Hypertension Mother   . Diabetes Mother   . Hypercholesterolemia Mother   . Hypertension Sister   . CVA Maternal Grandmother   . Hypertension Maternal Grandmother   . Diabetes Maternal Grandmother   . Heart attack Neg Hx     Social History   Socioeconomic History  . Marital status: Married    Spouse name: Not on file  . Number of children: Not on file  . Years of education: Not on file  . Highest education level: Not on file  Occupational History  . Not on file  Social Needs  . Financial resource strain: Not on file  . Food insecurity:    Worry: Not on file    Inability: Not on file  . Transportation needs:    Medical: Not on file    Non-medical: Not  on file  Tobacco Use  . Smoking status: Never Smoker  . Smokeless tobacco: Never Used  Substance and Sexual Activity  . Alcohol use: No  . Drug use: No  . Sexual activity: Not on file  Lifestyle  . Physical activity:    Days per week: Not on file    Minutes per session: Not on file  . Stress: Not on file  Relationships  . Social connections:    Talks on phone: Not on file    Gets together: Not on file    Attends religious service: Not on file    Active member of club or  organization: Not on file    Attends meetings of clubs or organizations: Not on file    Relationship status: Not on file  . Intimate partner violence:    Fear of current or ex partner: Not on file    Emotionally abused: Not on file    Physically abused: Not on file    Forced sexual activity: Not on file  Other Topics Concern  . Not on file  Social History Narrative  . Not on file    Outpatient Medications Prior to Visit  Medication Sig Dispense Refill  . albuterol (PROVENTIL HFA;VENTOLIN HFA) 108 (90 Base) MCG/ACT inhaler Inhale 2 puffs into the lungs every 4 (four) hours as needed for wheezing or shortness of breath (cough, shortness of breath or wheezing.). 1 Inhaler 6  . amLODipine (NORVASC) 5 MG tablet Take 1 tablet (5 mg total) by mouth daily. 90 tablet 3  . aspirin EC 81 MG EC tablet Take 1 tablet (81 mg total) by mouth daily.    . ferrous sulfate 325 (65 FE) MG tablet Take 325 mg by mouth 2 (two) times daily.    Marland Kitchen glipiZIDE (GLUCOTROL) 5 MG tablet TAKE 1 TABLET BY MOUTH TWICE DAILY BEFORE A MEAL 90 tablet 1  . Omega-3 Fatty Acids (FISH OIL) 1000 MG CAPS Take by mouth.    . hydrALAZINE (APRESOLINE) 25 MG tablet Take 1 tablet (25 mg total) by mouth every 8 (eight) hours. 180 tablet 4  . hydrochlorothiazide (HYDRODIURIL) 25 MG tablet Take 1 tablet (25 mg total) by mouth daily. 90 tablet 2  . losartan (COZAAR) 100 MG tablet Take 1 tablet (100 mg total) by mouth daily. 90 tablet 2  . metFORMIN (GLUCOPHAGE XR) 500 MG 24 hr tablet Take 1 tablet (500 mg total) by mouth daily with breakfast. 30 tablet 3  . metoprolol succinate (TOPROL-XL) 100 MG 24 hr tablet Take 1 tablet (100 mg total) by mouth daily. Take with or immediately following a meal. 90 tablet 3  . nitroGLYCERIN (NITROSTAT) 0.4 MG SL tablet Place 1 tablet (0.4 mg total) under the tongue every 5 (five) minutes x 3 doses as needed for chest pain. 25 tablet 6  . sitaGLIPtin (JANUVIA) 25 MG tablet Take 1 tablet (25 mg total) by mouth  daily. 30 tablet 2  . spironolactone (ALDACTONE) 25 MG tablet Take 1 tablet (25 mg total) by mouth daily. 90 tablet 1  . Vitamin D, Ergocalciferol, (DRISDOL) 1.25 MG (50000 UT) CAPS capsule Take 1 capsule (50,000 Units total) by mouth every 7 (seven) days. 30 capsule 0   No facility-administered medications prior to visit.     Allergies  Allergen Reactions  . Crestor [Rosuvastatin]     MUSCLE ACHES HEADACHES   . Hydrocodone Itching  . Other Hives, Swelling and Other (See Comments)    Vision changes; ALLERGIC TO ALL NUTS EXCEPT  PEANUTS  . Oxycodone Itching  . Pravachol [Pravastatin Sodium] Itching    Joint pain  . Simvastatin Hives    ROS Review of Systems  Constitutional: Negative.   HENT: Negative.   Eyes: Negative.   Respiratory: Negative.   Cardiovascular: Negative.   Gastrointestinal: Negative.   Endocrine: Negative.   Genitourinary: Negative.   Musculoskeletal: Negative.   Skin: Negative.   Allergic/Immunologic: Negative.   Neurological: Positive for dizziness and headaches.  Hematological: Negative.   Psychiatric/Behavioral: Negative.       Objective:    Physical Exam  Constitutional: She is oriented to person, place, and time. She appears well-developed and well-nourished.  HENT:  Head: Normocephalic and atraumatic.  Eyes: Conjunctivae are normal.  Neck: Normal range of motion. Neck supple.  Cardiovascular: Normal rate, regular rhythm, normal heart sounds and intact distal pulses.  Pulmonary/Chest: Effort normal and breath sounds normal.  Abdominal: Soft. Bowel sounds are normal.  Musculoskeletal: Normal range of motion.  Neurological: She is alert and oriented to person, place, and time. She has normal reflexes.  Skin: Skin is warm and dry.  Psychiatric: She has a normal mood and affect. Her behavior is normal. Judgment and thought content normal.  Vitals reviewed.   BP 136/78 (BP Location: Left Wrist, Patient Position: Sitting, Cuff Size: Large)    Pulse 82   Temp 97.9 F (36.6 C) (Oral)   Ht 5\' 8"  (1.727 m)   Wt (!) 312 lb (141.5 kg)   LMP 05/09/2012   SpO2 98%   BMI 47.44 kg/m  Wt Readings from Last 3 Encounters:  02/15/19 (!) 312 lb (141.5 kg)  11/07/18 (!) 307 lb (139.3 kg)  10/03/18 (!) 308 lb 1.9 oz (139.8 kg)     Health Maintenance Due  Topic Date Due  . PAP SMEAR-Modifier  06/17/2015  . MAMMOGRAM  08/26/2017  . COLONOSCOPY  08/26/2017  . FOOT EXAM  02/25/2018  . OPHTHALMOLOGY EXAM  07/13/2018    There are no preventive care reminders to display for this patient.  Lab Results  Component Value Date   TSH 0.392 (L) 08/05/2018   Lab Results  Component Value Date   WBC 5.4 08/05/2018   HGB 11.7 08/05/2018   HCT 38.1 08/05/2018   MCV 72 (L) 08/05/2018   PLT 423 08/05/2018   Lab Results  Component Value Date   NA 139 08/05/2018   K 4.4 08/05/2018   CO2 27 08/05/2018   GLUCOSE 148 (H) 08/05/2018   BUN 10 08/05/2018   CREATININE 0.69 08/05/2018   BILITOT 0.5 08/05/2018   ALKPHOS 46 08/05/2018   AST 13 08/05/2018   ALT 16 08/05/2018   PROT 7.4 08/05/2018   ALBUMIN 4.2 08/05/2018   CALCIUM 9.5 08/05/2018   ANIONGAP 7 04/26/2018   GFR 143.59 03/16/2014   Lab Results  Component Value Date   CHOL 177 08/05/2018   Lab Results  Component Value Date   HDL 52 08/05/2018   Lab Results  Component Value Date   LDLCALC 112 (H) 08/05/2018   Lab Results  Component Value Date   TRIG 67 08/05/2018   Lab Results  Component Value Date   CHOLHDL 3.4 08/05/2018   Lab Results  Component Value Date   HGBA1C 11.1 (A) 02/15/2019      Assessment & Plan:   1. Type 2 diabetes mellitus with other circulatory complication, unspecified whether long term insulin use (HCC) Hgb A1c has increased to 11.1, from 8.1 on 11/07/2018. She will continue to decrease  foods/beverages high in sugars and carbs and follow Heart Healthy or DASH diet. Increase physical activity to at least 30 minutes cardio exercise daily.  -  POCT urinalysis dipstick - POCT glycosylated hemoglobin (Hb A1C)  2. Hyperglycemia  3. Hx of non-ST elevation myocardial infarction (NSTEMI) No signs or symptoms of recurrence noted or reported.   4. Class 3 severe obesity due to excess calories with serious comorbidity and body mass index (BMI) of 45.0 to 49.9 in adult [facility]) Body mass index is 47.44 kg/m. Goal BMI  is <30. Encouraged efforts to reduce weight include engaging in physical activity as tolerated with goal of 150 minutes per week. Improve dietary choices and eat a meal regimen consistent with a Mediterranean or DASH diet. Reduce simple carbohydrates. Do not skip meals and eat healthy snacks throughout the day to avoid over-eating at dinner. Set a goal weight loss that is achievable for you.  5. Essential hypertension The current medical regimen is effective; Blood pressure is stable at 136/78 today; continue present plan and medications as prescribed. She will continue to decrease high sodium intake, excessive alcohol intake, increase potassium intake, smoking cessation, and increase physical activity of at least 30 minutes of cardio activity daily. She will continue to follow Heart Healthy or DASH diet.  6. Shortness of breath Occasional. Stable today. No signs or symptoms of respiratory distress noted or reported.   7. Dyspnea on exertion Stable today.   8. Follow up She will follow up in 3 months.   Meds ordered this encounter  Medications  . sitaGLIPtin (JANUVIA) 25 MG tablet    Sig: Take 1 tablet (25 mg total) by mouth daily.    Dispense:  30 tablet    Refill:  3  . hydrALAZINE (APRESOLINE) 25 MG tablet    Sig: Take 1 tablet (25 mg total) by mouth every 8 (eight) hours.    Dispense:  180 tablet    Refill:  2  . hydrochlorothiazide (HYDRODIURIL) 25 MG tablet    Sig: Take 1 tablet (25 mg total) by mouth daily.    Dispense:  90 tablet    Refill:  1  . losartan (COZAAR) 100 MG tablet    Sig: Take 1 tablet (100 mg  total) by mouth daily.    Dispense:  90 tablet    Refill:  1  . metFORMIN (GLUCOPHAGE XR) 500 MG 24 hr tablet    Sig: Take 1 tablet (500 mg total) by mouth daily with breakfast.    Dispense:  30 tablet    Refill:  3  . metoprolol succinate (TOPROL-XL) 100 MG 24 hr tablet    Sig: Take 1 tablet (100 mg total) by mouth daily. Take with or immediately following a meal.    Dispense:  90 tablet    Refill:  1  . nitroGLYCERIN (NITROSTAT) 0.4 MG SL tablet    Sig: Place 1 tablet (0.4 mg total) under the tongue every 5 (five) minutes x 3 doses as needed for chest pain.    Dispense:  25 tablet    Refill:  6  . spironolactone (ALDACTONE) 25 MG tablet    Sig: Take 1 tablet (25 mg total) by mouth daily.    Dispense:  90 tablet    Refill:  1  . Vitamin D, Ergocalciferol, (DRISDOL) 1.25 MG (50000 UT) CAPS capsule    Sig: Take 1 capsule (50,000 Units total) by mouth every 7 (seven) days.    Dispense:  30 capsule  Refill:  3    Orders Placed This Encounter  Procedures  . POCT urinalysis dipstick  . POCT glycosylated hemoglobin (Hb A1C)    Referral Orders  No referral(s) requested today    Kathe Becton,  MSN, FNP-C [facility] [place], [place] [address] [phone]    Problem List Items Addressed This Visit      Cardiovascular and Mediastinum   HTN (hypertension)   Relevant Medications   hydrALAZINE (APRESOLINE) 25 MG tablet   hydrochlorothiazide (HYDRODIURIL) 25 MG tablet   losartan (COZAAR) 100 MG tablet   metoprolol succinate (TOPROL-XL) 100 MG 24 hr tablet   nitroGLYCERIN (NITROSTAT) 0.4 MG SL tablet   spironolactone (ALDACTONE) 25 MG tablet     Endocrine   DM (diabetes mellitus) (HCC) - Primary   Relevant Medications   sitaGLIPtin (JANUVIA) 25 MG tablet   losartan (COZAAR) 100 MG tablet   metFORMIN (GLUCOPHAGE XR) 500 MG 24 hr tablet   Other Relevant Orders   POCT urinalysis dipstick (Completed)   POCT  glycosylated hemoglobin (Hb A1C) (Completed)     Other   Dyspnea on exertion    Other Visit Diagnoses    Hyperglycemia       Hx of non-ST elevation myocardial infarction (NSTEMI)       Class 3 severe obesity due to excess calories with serious comorbidity and body mass index (BMI) of 45.0 to 49.9 in adult [facility])       Relevant Medications   sitaGLIPtin (JANUVIA) 25 MG tablet   metFORMIN (GLUCOPHAGE XR) 500 MG 24 hr tablet   Shortness of breath       Follow up          Meds ordered this encounter  Medications  . sitaGLIPtin (JANUVIA) 25 MG tablet    Sig: Take 1 tablet (25 mg total) by mouth daily.    Dispense:  30 tablet    Refill:  3  . hydrALAZINE (APRESOLINE) 25 MG tablet    Sig: Take 1 tablet (25 mg total) by mouth every 8 (eight) hours.    Dispense:  180 tablet    Refill:  2  . hydrochlorothiazide (HYDRODIURIL) 25 MG tablet    Sig: Take 1 tablet (25 mg total) by mouth daily.    Dispense:  90 tablet    Refill:  1  . losartan (COZAAR) 100 MG tablet    Sig: Take 1 tablet (100 mg total) by mouth daily.    Dispense:  90 tablet    Refill:  1  . metFORMIN (GLUCOPHAGE XR) 500 MG 24 hr tablet    Sig: Take 1 tablet (500 mg total) by mouth daily with breakfast.    Dispense:  30 tablet    Refill:  3  . metoprolol succinate (TOPROL-XL) 100 MG 24 hr tablet    Sig: Take 1 tablet (100 mg total) by mouth daily. Take with or immediately following a meal.    Dispense:  90 tablet    Refill:  1  . nitroGLYCERIN (NITROSTAT) 0.4 MG SL tablet    Sig: Place 1 tablet (0.4 mg total) under the tongue every 5 (five) minutes x 3 doses as needed for chest pain.    Dispense:  25 tablet    Refill:  6  . spironolactone (ALDACTONE) 25 MG tablet    Sig: Take 1 tablet (25 mg total) by mouth daily.    Dispense:  90 tablet    Refill:  1  .  Vitamin D, Ergocalciferol, (DRISDOL) 1.25 MG (50000 UT) CAPS capsule    Sig: Take 1 capsule (50,000 Units total) by mouth every 7 (seven) days.    Dispense:  30  capsule    Refill:  3    Follow-up: Return in about 3 months (around 05/18/2019).    Azzie Glatter, FNP

## 2019-03-27 ENCOUNTER — Telehealth: Payer: Self-pay

## 2019-03-27 ENCOUNTER — Other Ambulatory Visit: Payer: Self-pay | Admitting: Family Medicine

## 2019-03-27 DIAGNOSIS — E1159 Type 2 diabetes mellitus with other circulatory complications: Secondary | ICD-10-CM

## 2019-03-27 DIAGNOSIS — R739 Hyperglycemia, unspecified: Secondary | ICD-10-CM

## 2019-03-27 MED ORDER — METFORMIN HCL 500 MG PO TABS: 500.0000 mg | ORAL_TABLET | Freq: Every day | ORAL | 1 refills | Status: DC

## 2019-03-27 NOTE — Telephone Encounter (Signed)
Patient calling to have her Metformin XR changed. Patient would like it sent to [facility] on [address].

## 2019-03-28 NOTE — Telephone Encounter (Signed)
Patient notified and will pick up new script  

## 2019-05-19 ENCOUNTER — Ambulatory Visit: Payer: Managed Care, Other (non HMO) | Admitting: Family Medicine

## 2019-06-14 ENCOUNTER — Ambulatory Visit (INDEPENDENT_AMBULATORY_CARE_PROVIDER_SITE_OTHER): Payer: Managed Care, Other (non HMO) | Admitting: Family Medicine

## 2019-06-14 ENCOUNTER — Other Ambulatory Visit: Payer: Self-pay

## 2019-06-14 ENCOUNTER — Encounter: Payer: Self-pay | Admitting: Family Medicine

## 2019-06-14 VITALS — BP 131/76 | HR 83 | Temp 98.3°F | Ht 68.0 in | Wt 315.8 lb

## 2019-06-14 DIAGNOSIS — E1159 Type 2 diabetes mellitus with other circulatory complications: Secondary | ICD-10-CM

## 2019-06-14 DIAGNOSIS — Z09 Encounter for follow-up examination after completed treatment for conditions other than malignant neoplasm: Secondary | ICD-10-CM

## 2019-06-14 DIAGNOSIS — R739 Hyperglycemia, unspecified: Secondary | ICD-10-CM

## 2019-06-14 DIAGNOSIS — Z6841 Body Mass Index (BMI) 40.0 and over, adult: Secondary | ICD-10-CM

## 2019-06-14 DIAGNOSIS — E66813 Obesity, class 3: Secondary | ICD-10-CM

## 2019-06-14 DIAGNOSIS — I10 Essential (primary) hypertension: Secondary | ICD-10-CM | POA: Diagnosis not present

## 2019-06-14 DIAGNOSIS — I252 Old myocardial infarction: Secondary | ICD-10-CM

## 2019-06-14 LAB — POCT URINALYSIS DIPSTICK
Bilirubin, UA: NEGATIVE
Blood, UA: NEGATIVE
Glucose, UA: POSITIVE — AB
Leukocytes, UA: NEGATIVE
Nitrite, UA: NEGATIVE
Protein, UA: NEGATIVE
Spec Grav, UA: >=1.03 — AB (ref 1.010–1.025)
Urobilinogen, UA: 0.2 E.U./dL
pH, UA: 5.5 (ref 5.0–8.0)

## 2019-06-14 LAB — POCT GLYCOSYLATED HEMOGLOBIN (HGB A1C)
HbA1c POC (<> result, manual entry): 13.6 % (ref 4.0–5.6)
HbA1c, POC (controlled diabetic range): 13.6 % — AB (ref 0.0–7.0)
HbA1c, POC (prediabetic range): 13.6 % — AB (ref 5.7–6.4)
Hemoglobin A1C: 13.6 % — AB (ref 4.0–5.6)

## 2019-06-14 MED ORDER — SITAGLIPTIN PHOSPHATE 25 MG PO TABS: 25.0000 mg | ORAL_TABLET | Freq: Every day | ORAL | 3 refills | Status: DC

## 2019-06-14 MED ORDER — GLIPIZIDE 10 MG PO TABS: 10.0000 mg | ORAL_TABLET | Freq: Two times a day (BID) | ORAL | 3 refills | Status: DC

## 2019-06-14 MED ORDER — METFORMIN HCL 1000 MG PO TABS: 1000.0000 mg | ORAL_TABLET | Freq: Two times a day (BID) | ORAL | 3 refills | Status: DC

## 2019-06-14 NOTE — Progress Notes (Signed)
Patient [facility]   Established Patient Office Visit  Subjective:  Patient ID: Claudia [name], female    DOB: 1966/08/10  Age: 53 y.o. MRN: [id number]  CC:  Chief Complaint  Patient presents with  . Follow-up    3 month follow up , dicuss breast reduction     HPI Claudia [name] is a 53 year female who presents for Follow Up today.   Past Medical History:  Diagnosis Date  . Diabetes mellitus   . Fibroids   . Headache(784.0)   . History of echocardiogram    Echo 7/18: Moderate concentric LVH, EF 55-60, normal wall motion, grade 1 diastolic dysfunction, calcified aortic leaflets  . Hypercholesterolemia   . Hyperlipidemia   . Hypertension   . Osteoarthritis    , with mild meniscus tear, followed by orthopedics--Dr Gladstone Lighter 04/29/10  . Right knee pain   . Seizures ([place])    as child  . Vitamin D deficiency    Current Status: Since her last office visit, she is doing well with no complaints. She denies visual changes, chest pain, cough, shortness of breath, heart palpitations, and falls. She has occasional headaches and dizziness with position changes. Denies severe headaches, confusion, seizures, double vision, and blurred vision, nausea and vomiting. She has not been taking medications as prescribed because of financial constraints. She has not been monitoring his blood glucose levels regularly. He denies fatigue, frequent urination, blurred vision, excessive hunger, excessive thirst, weight gain, weight loss, and poor wound healing. She continues to check his feet regularly.   She denies fevers, chills, recent infections, weight loss, and night sweats. No reports of GI problems such as nausea, vomiting, diarrhea, and constipation.She has no reports of blood in stools, dysuria and hematuria. No depression or anxiety reported today. She denies pain today.   Past Surgical History:  Procedure Laterality Date  . ANTERIOR CERVICAL  DECOMP/DISCECTOMY FUSION N/A 09/07/2013   Procedure: ANTERIOR CERVICAL DECOMPRESSION/DISCECTOMY FUSION 1 LEVEL;  Surgeon: Sinclair Ship, MD;  Location: [facility];  Service: Orthopedics;  Laterality: N/A;  Anterior cervical decompression fusion, cervical 5-6 with instrumentation and allograft  . LAPAROSCOPIC ASSISTED VAGINAL HYSTERECTOMY  1/14  . LEFT HEART CATH AND CORONARY ANGIOGRAPHY N/A 04/25/2018   Procedure: LEFT HEART CATH AND CORONARY ANGIOGRAPHY;  Surgeon: Lorretta Harp, MD;  Location: [facility];  Service: Cardiovascular;  Laterality: N/A;  . SHOULDER SURGERY Right 11   rotator cuff  . TUBAL LIGATION    . UMBILICAL HERNIA REPAIR      Family History  Problem Relation Age of Onset  . Hypertension Mother   . Diabetes Mother   . Hypercholesterolemia Mother   . Hypertension Sister   . CVA Maternal Grandmother   . Hypertension Maternal Grandmother   . Diabetes Maternal Grandmother   . Heart attack Neg Hx     Social History   Socioeconomic History  . Marital status: Married    Spouse name: Not on file  . Number of children: Not on file  . Years of education: Not on file  . Highest education level: Not on file  Occupational History  . Not on file  Social Needs  . Financial resource strain: Not on file  . Food insecurity    Worry: Not on file    Inability: Not on file  . Transportation needs    Medical: Not on file    Non-medical: Not on file  Tobacco Use  .  Smoking status: Never Smoker  . Smokeless tobacco: Never Used  Substance and Sexual Activity  . Alcohol use: No  . Drug use: No  . Sexual activity: Not on file  Lifestyle  . Physical activity    Days per week: Not on file    Minutes per session: Not on file  . Stress: Not on file  Relationships  . Social Herbalist on phone: Not on file    Gets together: Not on file    Attends religious service: Not on file    Active member of club or organization: Not on file    Attends meetings  of clubs or organizations: Not on file    Relationship status: Not on file  . Intimate partner violence    Fear of current or ex partner: Not on file    Emotionally abused: Not on file    Physically abused: Not on file    Forced sexual activity: Not on file  Other Topics Concern  . Not on file  Social History Narrative  . Not on file    Outpatient Medications Prior to Visit  Medication Sig Dispense Refill  . albuterol (PROVENTIL HFA;VENTOLIN HFA) 108 (90 Base) MCG/ACT inhaler Inhale 2 puffs into the lungs every 4 (four) hours as needed for wheezing or shortness of breath (cough, shortness of breath or wheezing.). 1 Inhaler 6  . amLODipine (NORVASC) 5 MG tablet Take 1 tablet (5 mg total) by mouth daily. 90 tablet 3  . aspirin EC 81 MG EC tablet Take 1 tablet (81 mg total) by mouth daily.    . ferrous sulfate 325 (65 FE) MG tablet Take 325 mg by mouth 2 (two) times daily.    . hydrALAZINE (APRESOLINE) 25 MG tablet Take 1 tablet (25 mg total) by mouth every 8 (eight) hours. 180 tablet 2  . hydrochlorothiazide (HYDRODIURIL) 25 MG tablet Take 1 tablet (25 mg total) by mouth daily. 90 tablet 1  . losartan (COZAAR) 100 MG tablet Take 1 tablet (100 mg total) by mouth daily. 90 tablet 1  . metoprolol succinate (TOPROL-XL) 100 MG 24 hr tablet Take 1 tablet (100 mg total) by mouth daily. Take with or immediately following a meal. 90 tablet 1  . nitroGLYCERIN (NITROSTAT) 0.4 MG SL tablet Place 1 tablet (0.4 mg total) under the tongue every 5 (five) minutes x 3 doses as needed for chest pain. 25 tablet 6  . Omega-3 Fatty Acids (FISH OIL) 1000 MG CAPS Take by mouth.    . spironolactone (ALDACTONE) 25 MG tablet Take 1 tablet (25 mg total) by mouth daily. 90 tablet 1  . Vitamin D, Ergocalciferol, (DRISDOL) 1.25 MG (50000 UT) CAPS capsule Take 1 capsule (50,000 Units total) by mouth every 7 (seven) days. 30 capsule 3  . glipiZIDE (GLUCOTROL) 5 MG tablet TAKE 1 TABLET BY MOUTH TWICE DAILY BEFORE A MEAL 90  tablet 1  . metFORMIN (GLUCOPHAGE) 500 MG tablet Take 1 tablet (500 mg total) by mouth daily with breakfast. 90 tablet 1  . sitaGLIPtin (JANUVIA) 25 MG tablet Take 1 tablet (25 mg total) by mouth daily. (Patient not taking: Reported on 06/14/2019) 30 tablet 3   No facility-administered medications prior to visit.     Allergies  Allergen Reactions  . Crestor [Rosuvastatin]     MUSCLE ACHES HEADACHES   . Hydrocodone Itching  . Other Hives, Swelling and Other (See Comments)    Vision changes; ALLERGIC TO ALL NUTS EXCEPT PEANUTS  . Oxycodone  Itching  . Pravachol [Pravastatin Sodium] Itching    Joint pain  . Simvastatin Hives    ROS Review of Systems  Constitutional: Negative.   HENT: Negative.   Eyes: Negative.   Respiratory: Negative.   Cardiovascular: Negative.   Gastrointestinal: Negative.   Endocrine: Negative.   Genitourinary: Negative.   Musculoskeletal: Negative.   Skin: Negative.   Allergic/Immunologic: Negative.   Neurological: Positive for dizziness (occasional ) and headaches (Occasional).  Hematological: Negative.   Psychiatric/Behavioral: Negative.       Objective:    Physical Exam  Constitutional: She is oriented to person, place, and time. She appears well-developed and well-nourished.  HENT:  Head: Normocephalic and atraumatic.  Eyes: Conjunctivae are normal.  Neck: Normal range of motion. Neck supple.  Cardiovascular: Normal rate, regular rhythm, normal heart sounds and intact distal pulses.  Pulmonary/Chest: Effort normal and breath sounds normal.  Abdominal: Soft. Bowel sounds are normal.  Musculoskeletal: Normal range of motion.  Neurological: She is alert and oriented to person, place, and time. She has normal reflexes.  Skin: Skin is warm and dry.  Psychiatric: She has a normal mood and affect. Her behavior is normal. Judgment and thought content normal.  Nursing note and vitals reviewed.   BP 131/76 (BP Location: Left Arm, Patient Position:  Sitting, Cuff Size: Normal)   Pulse 83   Temp 98.3 F (36.8 C) (Oral)   Ht 5\' 8"  (1.727 m)   Wt (!) 315 lb 12.8 oz (143.2 kg)   LMP 05/09/2012   SpO2 94%   BMI 48.02 kg/m  Wt Readings from Last 3 Encounters:  06/14/19 (!) 315 lb 12.8 oz (143.2 kg)  02/15/19 (!) 312 lb (141.5 kg)  11/07/18 (!) 307 lb (139.3 kg)   Health Maintenance Due  Topic Date Due  . PAP SMEAR-Modifier  06/17/2015  . MAMMOGRAM  08/26/2017  . COLONOSCOPY  08/26/2017  . FOOT EXAM  02/25/2018  . OPHTHALMOLOGY EXAM  07/13/2018  . INFLUENZA VACCINE  04/29/2019    There are no preventive care reminders to display for this patient.  Lab Results  Component Value Date   TSH 0.392 (L) 08/05/2018   Lab Results  Component Value Date   WBC 5.4 08/05/2018   HGB 11.7 08/05/2018   HCT 38.1 08/05/2018   MCV 72 (L) 08/05/2018   PLT 423 08/05/2018   Lab Results  Component Value Date   NA 139 08/05/2018   K 4.4 08/05/2018   CO2 27 08/05/2018   GLUCOSE 148 (H) 08/05/2018   BUN 10 08/05/2018   CREATININE 0.69 08/05/2018   BILITOT 0.5 08/05/2018   ALKPHOS 46 08/05/2018   AST 13 08/05/2018   ALT 16 08/05/2018   PROT 7.4 08/05/2018   ALBUMIN 4.2 08/05/2018   CALCIUM 9.5 08/05/2018   ANIONGAP 7 04/26/2018   GFR 143.59 03/16/2014   Lab Results  Component Value Date   CHOL 177 08/05/2018   Lab Results  Component Value Date   HDL 52 08/05/2018   Lab Results  Component Value Date   LDLCALC 112 (H) 08/05/2018   Lab Results  Component Value Date   TRIG 67 08/05/2018   Lab Results  Component Value Date   CHOLHDL 3.4 08/05/2018   Lab Results  Component Value Date   HGBA1C 13.6 (A) 06/14/2019   HGBA1C 13.6 06/14/2019   HGBA1C 13.6 (A) 06/14/2019   HGBA1C 13.6 (A) 06/14/2019   Assessment & Plan:   1. Type 2 diabetes mellitus with other circulatory complication,  unspecified whether long term insulin use ([place]) She will continue to decrease foods/beverages high in sugars and carbs and follow Heart  Healthy or DASH diet. Increase physical activity to at least 30 minutes cardio exercise daily.  - POCT Urinalysis Dipstick - HgB A1c - glipiZIDE (GLUCOTROL) 10 MG tablet; Take 1 tablet (10 mg total) by mouth 2 (two) times daily before a meal.  Dispense: 60 tablet; Refill: 3 - metFORMIN (GLUCOPHAGE) 1000 MG tablet; Take 1 tablet (1,000 mg total) by mouth 2 (two) times daily with a meal.  Dispense: 180 tablet; Refill: 3 - sitaGLIPtin (JANUVIA) 25 MG tablet; Take 1 tablet (25 mg total) by mouth daily.  Dispense: 30 tablet; Refill: 3  2. Essential hypertension The current medical regimen is effective; blood pressure is stable at 131/76 today; continue present plan and medications as prescribed. She will continue to decrease high sodium intake, excessive alcohol intake, increase potassium intake, smoking cessation, and increase physical activity of at least 30 minutes of cardio activity daily. She will continue to follow Heart Healthy or DASH diet.  3. Hx of non-ST elevation myocardial infarction (NSTEMI)  4. Class 3 severe obesity due to excess calories with serious comorbidity and body mass index (BMI) of 45.0 to 49.9 in adult [facility]) Body mass index is 48.02 kg/m.  Goal BMI  is <30. Encouraged efforts to reduce weight include engaging in physical activity as tolerated with goal of 150 minutes per week. Improve dietary choices and eat a meal regimen consistent with a Mediterranean or DASH diet. Reduce simple carbohydrates. Do not skip meals and eat healthy snacks throughout the day to avoid over-eating at dinner. Set a goal weight loss that is achievable for you.  5. Hyperglycemia  6. Follow up She will follow up in 3 months.   Meds ordered this encounter  Medications  . glipiZIDE (GLUCOTROL) 10 MG tablet    Sig: Take 1 tablet (10 mg total) by mouth 2 (two) times daily before a meal.    Dispense:  60 tablet    Refill:  3  . metFORMIN (GLUCOPHAGE) 1000 MG tablet    Sig: Take 1 tablet (1,000 mg  total) by mouth 2 (two) times daily with a meal.    Dispense:  180 tablet    Refill:  3  . sitaGLIPtin (JANUVIA) 25 MG tablet    Sig: Take 1 tablet (25 mg total) by mouth daily.    Dispense:  30 tablet    Refill:  3    Orders Placed This Encounter  Procedures  . POCT Urinalysis Dipstick  . HgB A1c    Referral Orders  No referral(s) requested today   Kathe Becton,  MSN, FNP-BC [place], [place] [address] [phone] fax  Problem List Items Addressed This Visit      Cardiovascular and Mediastinum   HTN (hypertension)     Endocrine   DM (diabetes mellitus) ([place]) - Primary   Relevant Medications   glipiZIDE (GLUCOTROL) 10 MG tablet   metFORMIN (GLUCOPHAGE) 1000 MG tablet   sitaGLIPtin (JANUVIA) 25 MG tablet   Other Relevant Orders   POCT Urinalysis Dipstick (Completed)   HgB A1c (Completed)    Other Visit Diagnoses    Hx of non-ST elevation myocardial infarction (NSTEMI)       Class 3 severe obesity due to excess calories with serious comorbidity and body mass index (BMI) of 45.0 to 49.9 in adult (  [place])       Relevant Medications   glipiZIDE (GLUCOTROL) 10 MG tablet   metFORMIN (GLUCOPHAGE) 1000 MG tablet   sitaGLIPtin (JANUVIA) 25 MG tablet   Hyperglycemia       Follow up          Meds ordered this encounter  Medications  . glipiZIDE (GLUCOTROL) 10 MG tablet    Sig: Take 1 tablet (10 mg total) by mouth 2 (two) times daily before a meal.    Dispense:  60 tablet    Refill:  3  . metFORMIN (GLUCOPHAGE) 1000 MG tablet    Sig: Take 1 tablet (1,000 mg total) by mouth 2 (two) times daily with a meal.    Dispense:  180 tablet    Refill:  3  . sitaGLIPtin (JANUVIA) 25 MG tablet    Sig: Take 1 tablet (25 mg total) by mouth daily.    Dispense:  30 tablet    Refill:  3    Follow-up: Return in about 3 months (around 09/13/2019).    Azzie Glatter, FNP

## 2019-06-16 DIAGNOSIS — R739 Hyperglycemia, unspecified: Secondary | ICD-10-CM | POA: Insufficient documentation

## 2019-06-16 DIAGNOSIS — E66813 Obesity, class 3: Secondary | ICD-10-CM | POA: Insufficient documentation

## 2019-06-16 DIAGNOSIS — I252 Old myocardial infarction: Secondary | ICD-10-CM | POA: Insufficient documentation

## 2019-08-03 ENCOUNTER — Other Ambulatory Visit: Payer: Self-pay | Admitting: Family Medicine

## 2019-08-03 DIAGNOSIS — I10 Essential (primary) hypertension: Secondary | ICD-10-CM

## 2019-09-07 ENCOUNTER — Other Ambulatory Visit: Payer: Self-pay | Admitting: Family Medicine

## 2019-09-07 DIAGNOSIS — I10 Essential (primary) hypertension: Secondary | ICD-10-CM

## 2019-09-15 ENCOUNTER — Ambulatory Visit: Payer: Managed Care, Other (non HMO) | Admitting: Family Medicine

## 2019-10-03 ENCOUNTER — Other Ambulatory Visit: Payer: Self-pay

## 2019-10-03 ENCOUNTER — Ambulatory Visit (INDEPENDENT_AMBULATORY_CARE_PROVIDER_SITE_OTHER): Payer: Self-pay | Admitting: Family Medicine

## 2019-10-03 ENCOUNTER — Encounter: Payer: Self-pay | Admitting: Family Medicine

## 2019-10-03 VITALS — BP 135/80 | HR 96 | Temp 97.9°F | Ht 68.0 in | Wt 306.6 lb

## 2019-10-03 DIAGNOSIS — R739 Hyperglycemia, unspecified: Secondary | ICD-10-CM

## 2019-10-03 DIAGNOSIS — R0600 Dyspnea, unspecified: Secondary | ICD-10-CM

## 2019-10-03 DIAGNOSIS — E66813 Obesity, class 3: Secondary | ICD-10-CM

## 2019-10-03 DIAGNOSIS — E1159 Type 2 diabetes mellitus with other circulatory complications: Secondary | ICD-10-CM

## 2019-10-03 DIAGNOSIS — Z09 Encounter for follow-up examination after completed treatment for conditions other than malignant neoplasm: Secondary | ICD-10-CM

## 2019-10-03 DIAGNOSIS — D649 Anemia, unspecified: Secondary | ICD-10-CM

## 2019-10-03 DIAGNOSIS — R0609 Other forms of dyspnea: Secondary | ICD-10-CM

## 2019-10-03 DIAGNOSIS — R634 Abnormal weight loss: Secondary | ICD-10-CM

## 2019-10-03 DIAGNOSIS — R0602 Shortness of breath: Secondary | ICD-10-CM

## 2019-10-03 DIAGNOSIS — R8290 Unspecified abnormal findings in urine: Secondary | ICD-10-CM

## 2019-10-03 DIAGNOSIS — Z6841 Body Mass Index (BMI) 40.0 and over, adult: Secondary | ICD-10-CM

## 2019-10-03 DIAGNOSIS — I10 Essential (primary) hypertension: Secondary | ICD-10-CM

## 2019-10-03 DIAGNOSIS — R7309 Other abnormal glucose: Secondary | ICD-10-CM

## 2019-10-03 DIAGNOSIS — Z124 Encounter for screening for malignant neoplasm of cervix: Secondary | ICD-10-CM

## 2019-10-03 LAB — POCT URINALYSIS DIPSTICK
Blood, UA: NEGATIVE
Glucose, UA: POSITIVE — AB
Nitrite, UA: NEGATIVE
Protein, UA: POSITIVE — AB
Spec Grav, UA: 1.025 (ref 1.010–1.025)
Urobilinogen, UA: 0.2 E.U./dL
pH, UA: 5 (ref 5.0–8.0)

## 2019-10-03 LAB — POCT GLYCOSYLATED HEMOGLOBIN (HGB A1C): Hemoglobin A1C: 12 % — AB (ref 4.0–5.6)

## 2019-10-03 LAB — GLUCOSE, POCT (MANUAL RESULT ENTRY): POC Glucose: 296 mg/dl — AB (ref 70–99)

## 2019-10-03 MED ORDER — BLOOD GLUCOSE MONITOR KIT: PACK | 0 refills | Status: AC

## 2019-10-03 NOTE — Progress Notes (Signed)
Patient [facility]   Established Patient Office Visit  Subjective:  Patient ID: Claudia [name], female    DOB: 1966/04/28  Age: 54 y.o. MRN: [id number]  CC:  Chief Complaint  Patient presents with  . Follow-up    DM    HPI Claudia [name] is a 54 year old female who  presents for Follow Up today.   Past Medical History:  Diagnosis Date  . Diabetes mellitus   . Fibroids   . Headache(784.0)   . History of echocardiogram    Echo 7/18: Moderate concentric LVH, EF 55-60, normal wall motion, grade 1 diastolic dysfunction, calcified aortic leaflets  . Hypercholesterolemia   . Hyperlipidemia   . Hypertension   . Osteoarthritis    , with mild meniscus tear, followed by orthopedics--Dr Gladstone Lighter 04/29/10  . Right knee pain   . Seizures ([place])    as child  . Vitamin D deficiency    Current Status: Since her last office visit, she is doing well with no complaints. She denies visual changes, chest pain, cough, shortness of breath, heart palpitations, and falls. She has occasional headaches and dizziness with position changes. Denies severe headaches, confusion, seizures, double vision, and blurred vision, nausea and vomiting. She denies fatigue, frequent urination, blurred vision, excessive hunger, excessive thirst, weight gain, weight loss, and poor wound healing. She continues to check her feet regularly. She is beginning to eat in smaller portions and choosing healthier foods to eat. She has had an significant weight loss recently. Her anxiety is stable today. She denies suicidal ideations, homicidal ideations, or auditory hallucinations. She denies fevers, chills, recent infections, weight loss, and night sweats. No reports of GI problems such as diarrhea, and constipation. She has no reports of blood in stools, dysuria and hematuria. She denies pain today.   Past Surgical History:  Procedure Laterality Date  . ANTERIOR CERVICAL DECOMP/DISCECTOMY FUSION  N/A 09/07/2013   Procedure: ANTERIOR CERVICAL DECOMPRESSION/DISCECTOMY FUSION 1 LEVEL;  Surgeon: Sinclair Ship, MD;  Location: [place];  Service: Orthopedics;  Laterality: N/A;  Anterior cervical decompression fusion, cervical 5-6 with instrumentation and allograft  . LAPAROSCOPIC ASSISTED VAGINAL HYSTERECTOMY  1/14  . LEFT HEART CATH AND CORONARY ANGIOGRAPHY N/A 04/25/2018   Procedure: LEFT HEART CATH AND CORONARY ANGIOGRAPHY;  Surgeon: Lorretta Harp, MD;  Location: [facility];  Service: Cardiovascular;  Laterality: N/A;  . SHOULDER SURGERY Right 11   rotator cuff  . TUBAL LIGATION    . UMBILICAL HERNIA REPAIR      Family History  Problem Relation Age of Onset  . Hypertension Mother   . Diabetes Mother   . Hypercholesterolemia Mother   . Hypertension Sister   . CVA Maternal Grandmother   . Hypertension Maternal Grandmother   . Diabetes Maternal Grandmother   . Heart attack Neg Hx     Social History   Socioeconomic History  . Marital status: Married    Spouse name: Not on file  . Number of children: Not on file  . Years of education: Not on file  . Highest education level: Not on file  Occupational History  . Not on file  Tobacco Use  . Smoking status: Never Smoker  . Smokeless tobacco: Never Used  Substance and Sexual Activity  . Alcohol use: No  . Drug use: No  . Sexual activity: Yes  Other Topics Concern  . Not on file  Social History Narrative  . Not on file  Social Determinants of Health   Financial Resource Strain:   . Difficulty of Paying Living Expenses: Not on file  Food Insecurity:   . Worried About Charity fundraiser in the Last Year: Not on file  . Ran Out of Food in the Last Year: Not on file  Transportation Needs:   . Lack of Transportation (Medical): Not on file  . Lack of Transportation (Non-Medical): Not on file  Physical Activity:   . Days of Exercise per Week: Not on file  . Minutes of Exercise per Session: Not on file    Stress:   . Feeling of Stress : Not on file  Social Connections:   . Frequency of Communication with Friends and Family: Not on file  . Frequency of Social Gatherings with Friends and Family: Not on file  . Attends Religious Services: Not on file  . Active Member of Clubs or Organizations: Not on file  . Attends Archivist Meetings: Not on file  . Marital Status: Not on file  Intimate Partner Violence:   . Fear of Current or Ex-Partner: Not on file  . Emotionally Abused: Not on file  . Physically Abused: Not on file  . Sexually Abused: Not on file    Outpatient Medications Prior to Visit  Medication Sig Dispense Refill  . Omega-3 Fatty Acids (FISH OIL) 1000 MG CAPS Take by mouth.    . vitamin B-12 (CYANOCOBALAMIN) 100 MCG tablet Take 100 mcg by mouth daily.    Marland Kitchen albuterol (PROVENTIL HFA;VENTOLIN HFA) 108 (90 Base) MCG/ACT inhaler Inhale 2 puffs into the lungs every 4 (four) hours as needed for wheezing or shortness of breath (cough, shortness of breath or wheezing.). 1 Inhaler 6  . amLODipine (NORVASC) 5 MG tablet Take 1 tablet by mouth once daily 30 tablet 0  . aspirin EC 81 MG EC tablet Take 1 tablet (81 mg total) by mouth daily.    . ferrous sulfate 325 (65 FE) MG tablet Take 325 mg by mouth 2 (two) times daily.    Marland Kitchen glipiZIDE (GLUCOTROL) 10 MG tablet Take 1 tablet (10 mg total) by mouth 2 (two) times daily before a meal. 60 tablet 3  . hydrALAZINE (APRESOLINE) 25 MG tablet Take 1 tablet (25 mg total) by mouth every 8 (eight) hours. 180 tablet 2  . hydrochlorothiazide (HYDRODIURIL) 25 MG tablet Take 1 tablet (25 mg total) by mouth daily. 90 tablet 1  . losartan (COZAAR) 100 MG tablet Take 1 tablet (100 mg total) by mouth daily. 90 tablet 1  . metFORMIN (GLUCOPHAGE) 1000 MG tablet Take 1 tablet (1,000 mg total) by mouth 2 (two) times daily with a meal. 180 tablet 3  . metoprolol succinate (TOPROL-XL) 100 MG 24 hr tablet Take 1 tablet (100 mg total) by mouth daily. Take with  or immediately following a meal. 90 tablet 1  . spironolactone (ALDACTONE) 25 MG tablet Take 1 tablet (25 mg total) by mouth daily. 90 tablet 1  . Vitamin D, Ergocalciferol, (DRISDOL) 1.25 MG (50000 UT) CAPS capsule Take 1 capsule (50,000 Units total) by mouth every 7 (seven) days. 30 capsule 3  . nitroGLYCERIN (NITROSTAT) 0.4 MG SL tablet Place 1 tablet (0.4 mg total) under the tongue every 5 (five) minutes x 3 doses as needed for chest pain. (Patient not taking: Reported on 10/03/2019) 25 tablet 6  . sitaGLIPtin (JANUVIA) 25 MG tablet Take 1 tablet (25 mg total) by mouth daily. (Patient not taking: Reported on 10/03/2019) 30 tablet 3  No facility-administered medications prior to visit.    Allergies  Allergen Reactions  . Crestor [Rosuvastatin]     MUSCLE ACHES HEADACHES   . Hydrocodone Itching  . Other Hives, Swelling and Other (See Comments)    Vision changes; ALLERGIC TO ALL NUTS EXCEPT PEANUTS  . Oxycodone Itching  . Pravachol [Pravastatin Sodium] Itching    Joint pain  . Simvastatin Hives    ROS Review of Systems  Constitutional: Negative.   HENT: Negative.   Eyes: Negative.   Respiratory: Negative.   Cardiovascular: Negative.   Gastrointestinal: Negative.   Endocrine: Negative.   Genitourinary: Negative.   Musculoskeletal: Negative.   Skin: Negative.       Objective:    Physical Exam  Constitutional: She appears well-developed and well-nourished.  HENT:  Head: Normocephalic and atraumatic.  Eyes: Conjunctivae are normal.  Cardiovascular: Normal rate, regular rhythm, normal heart sounds and intact distal pulses.  Pulmonary/Chest: Effort normal and breath sounds normal.  Abdominal: Soft. Bowel sounds are normal.  Musculoskeletal:     Cervical back: Normal range of motion and neck supple.  Skin: Skin is warm and dry.  Psychiatric: She has a normal mood and affect. Her behavior is normal. Judgment and thought content normal.  Nursing note and vitals  reviewed.   BP 135/80   Pulse 96   Temp 97.9 F (36.6 C) (Oral)   Ht _0  (1.727 m)   Wt (!) 306 lb 9.6 oz (139.1 kg)   LMP 05/09/2012   SpO2 97%   BMI 46.62 kg/m  Wt Readings from Last 3 Encounters:  10/03/19 (!) 306 lb 9.6 oz (139.1 kg)  06/14/19 (!) 315 lb 12.8 oz (143.2 kg)  02/15/19 (!) 312 lb (141.5 kg)     Health Maintenance Due  Topic Date Due  . PAP SMEAR-Modifier  06/17/2015  . MAMMOGRAM  08/26/2017  . COLONOSCOPY  08/26/2017  . FOOT EXAM  02/25/2018  . OPHTHALMOLOGY EXAM  07/13/2018  . INFLUENZA VACCINE  04/29/2019    There are no preventive care reminders to display for this patient.  Lab Results  Component Value Date   TSH 0.496 10/03/2019   Lab Results  Component Value Date   WBC 7.8 10/03/2019   HGB 12.7 10/03/2019   HCT 41.7 10/03/2019   MCV 73 (L) 10/03/2019   PLT 408 10/03/2019   Lab Results  Component Value Date   NA 136 10/03/2019   K 4.2 10/03/2019   CO2 27 10/03/2019   GLUCOSE 269 (H) 10/03/2019   BUN 14 10/03/2019   CREATININE 0.87 10/03/2019   BILITOT 0.6 10/03/2019   ALKPHOS 67 10/03/2019   AST 23 10/03/2019   ALT 22 10/03/2019   PROT 7.8 10/03/2019   ALBUMIN 4.6 10/03/2019   CALCIUM 10.0 10/03/2019   ANIONGAP 7 04/26/2018   GFR 143.59 03/16/2014   Lab Results  Component Value Date   CHOL 203 (H) 10/03/2019   Lab Results  Component Value Date   HDL 51 10/03/2019   Lab Results  Component Value Date   LDLCALC 137 (H) 10/03/2019   Lab Results  Component Value Date   TRIG 83 10/03/2019   Lab Results  Component Value Date   CHOLHDL 4.0 10/03/2019   Lab Results  Component Value Date   HGBA1C 12.0 (A) 10/03/2019      Assessment & Plan:   1. Type 2 diabetes mellitus with other circulatory complication, unspecified whether long term insulin use ([place]) She will continue medication as prescribed,  to decrease foods/beverages high in sugars and carbs and follow Heart Healthy or DASH diet. Increase physical activity  to at least 30 minutes cardio exercise daily.  - POCT HgB A1C - Glucose (CBG) - Urinalysis Dipstick - blood glucose meter kit and supplies KIT; Dispense based on patient and insurance preference. Use up to four times daily as directed. (FOR ICD-9 250.00, 250.01).  Dispense: 1 each; Refill: 0 - CBC with Differential - Comp Met (CMET) - Lipid Panel - TSH - Vitamin B12 - Vitamin D, 25-hydroxy - glipiZIDE (GLUCOTROL) 10 MG tablet; Take 1 tablet (10 mg total) by mouth 2 (two) times daily before a meal.  Dispense: 60 tablet; Refill: 6 - metFORMIN (GLUCOPHAGE) 1000 MG tablet; Take 1 tablet (1,000 mg total) by mouth 2 (two) times daily with a meal.  Dispense: 60 tablet; Refill: 6  2. Hemoglobin A1c greater than 9.0% Hgb A1c decreased at 12.0, from 13.6 on 06/14/2019.  - blood glucose meter kit and supplies KIT; Dispense based on patient and insurance preference. Use up to four times daily as directed. (FOR ICD-9 250.00, 250.01).  Dispense: 1 each; Refill: 0  3. Hyperglycemia Blood glucose at 296.  - blood glucose meter kit and supplies KIT; Dispense based on patient and insurance preference. Use up to four times daily as directed. (FOR ICD-9 250.00, 250.01).  Dispense: 1 each; Refill: 0  4. Essential hypertension The current medical regimen is effective; blood pressure is stable at 135/80 today; continue present plan and medications as prescribed. She will continue to take medications as prescribed, to decrease high sodium intake, excessive alcohol intake, increase potassium intake, smoking cessation, and increase physical activity of at least 30 minutes of cardio activity daily. She will continue to follow Heart Healthy or DASH diet. - Urinalysis Dipstick - amLODipine (NORVASC) 5 MG tablet; Take 1 tablet (5 mg total) by mouth daily.  Dispense: 30 tablet; Refill: 6 - aspirin 81 MG EC tablet; Take 1 tablet (81 mg total) by mouth daily.  Dispense: 30 tablet; Refill: 6 - hydrALAZINE (APRESOLINE) 25 MG  tablet; Take 1 tablet (25 mg total) by mouth every 8 (eight) hours.  Dispense: 90 tablet; Refill: 6 - hydrochlorothiazide (HYDRODIURIL) 25 MG tablet; Take 1 tablet (25 mg total) by mouth daily.  Dispense: 30 tablet; Refill: 6 - losartan (COZAAR) 100 MG tablet; Take 1 tablet (100 mg total) by mouth daily.  Dispense: 30 tablet; Refill: 6 - metoprolol succinate (TOPROL-XL) 100 MG 24 hr tablet; Take 1 tablet (100 mg total) by mouth daily. Take with or immediately following a meal.  Dispense: 30 tablet; Refill: 6 - spironolactone (ALDACTONE) 25 MG tablet; Take 1 tablet (25 mg total) by mouth daily.  Dispense: 30 tablet; Refill: 6 - Vitamin D, Ergocalciferol, (DRISDOL) 1.25 MG (50000 UT) CAPS capsule; Take 1 capsule (50,000 Units total) by mouth every 7 (seven) days.  Dispense: 5 capsule; Refill: 6 - nitroGLYCERIN (NITROSTAT) 0.4 MG SL tablet; Place 1 tablet (0.4 mg total) under the tongue every 5 (five) minutes x 3 doses as needed for chest pain.  Dispense: 100 tablet; Refill: 6  5. Class 3 severe obesity due to excess calories with serious comorbidity and body mass index (BMI) of 45.0 to 49.9 in adult [facility]) Body mass index is 46.62 kg/m.  Goal BMI  is <30. Encouraged efforts to reduce weight include engaging in physical activity as tolerated with goal of 150 minutes per week. Improve dietary choices and eat a meal regimen consistent with a Mediterranean or  DASH diet. Reduce simple carbohydrates. Do not skip meals and eat healthy snacks throughout the day to avoid over-eating at dinner. Set a goal weight loss that is achievable for you.  6. Weight loss 10 lb weight loss in 4 months.   7. Abnormal urinalysis Results are pending.  - Urine Culture  8. Cervical cancer screening - MM Digital Diagnostic Bilat; Future  9. Shortness of breath Stable.  - albuterol (VENTOLIN HFA) 108 (90 Base) MCG/ACT inhaler; Inhale 2 puffs into the lungs every 4 (four) hours as needed for wheezing or shortness of breath  (cough, shortness of breath or wheezing.).  Dispense: 8 g; Refill: 6  10. Dyspnea on exertion Stable. No signs or symptoms of respiratory distress noted or reported.  - albuterol (VENTOLIN HFA) 108 (90 Base) MCG/ACT inhaler; Inhale 2 puffs into the lungs every 4 (four) hours as needed for wheezing or shortness of breath (cough, shortness of breath or wheezing.).  Dispense: 8 g; Refill: 6  11. Anemia, unspecified type - ferrous sulfate 325 (65 FE) MG tablet; Take 1 tablet (325 mg total) by mouth 2 (two) times daily.  Dispense: 60 tablet; Refill: 6  12. Follow up He will follow up in 3 months.   Meds ordered this encounter  Medications  . blood glucose meter kit and supplies KIT    Sig: Dispense based on patient and insurance preference. Use up to four times daily as directed. (FOR ICD-9 250.00, 250.01).    Dispense:  1 each    Refill:  0    Order Specific Question:   Number of strips    Answer:   100    Order Specific Question:   Number of lancets    Answer:   100  . albuterol (VENTOLIN HFA) 108 (90 Base) MCG/ACT inhaler    Sig: Inhale 2 puffs into the lungs every 4 (four) hours as needed for wheezing or shortness of breath (cough, shortness of breath or wheezing.).    Dispense:  8 g    Refill:  6  . amLODipine (NORVASC) 5 MG tablet    Sig: Take 1 tablet (5 mg total) by mouth daily.    Dispense:  30 tablet    Refill:  6  . aspirin 81 MG EC tablet    Sig: Take 1 tablet (81 mg total) by mouth daily.    Dispense:  30 tablet    Refill:  6  . ferrous sulfate 325 (65 FE) MG tablet    Sig: Take 1 tablet (325 mg total) by mouth 2 (two) times daily.    Dispense:  60 tablet    Refill:  6  . glipiZIDE (GLUCOTROL) 10 MG tablet    Sig: Take 1 tablet (10 mg total) by mouth 2 (two) times daily before a meal.    Dispense:  60 tablet    Refill:  6  . hydrALAZINE (APRESOLINE) 25 MG tablet    Sig: Take 1 tablet (25 mg total) by mouth every 8 (eight) hours.    Dispense:  90 tablet    Refill:   6  . hydrochlorothiazide (HYDRODIURIL) 25 MG tablet    Sig: Take 1 tablet (25 mg total) by mouth daily.    Dispense:  30 tablet    Refill:  6  . losartan (COZAAR) 100 MG tablet    Sig: Take 1 tablet (100 mg total) by mouth daily.    Dispense:  30 tablet    Refill:  6  . metFORMIN (GLUCOPHAGE)  1000 MG tablet    Sig: Take 1 tablet (1,000 mg total) by mouth 2 (two) times daily with a meal.    Dispense:  60 tablet    Refill:  6  . metoprolol succinate (TOPROL-XL) 100 MG 24 hr tablet    Sig: Take 1 tablet (100 mg total) by mouth daily. Take with or immediately following a meal.    Dispense:  30 tablet    Refill:  6  . spironolactone (ALDACTONE) 25 MG tablet    Sig: Take 1 tablet (25 mg total) by mouth daily.    Dispense:  30 tablet    Refill:  6  . Vitamin D, Ergocalciferol, (DRISDOL) 1.25 MG (50000 UT) CAPS capsule    Sig: Take 1 capsule (50,000 Units total) by mouth every 7 (seven) days.    Dispense:  5 capsule    Refill:  6  . nitroGLYCERIN (NITROSTAT) 0.4 MG SL tablet    Sig: Place 1 tablet (0.4 mg total) under the tongue every 5 (five) minutes x 3 doses as needed for chest pain.    Dispense:  100 tablet    Refill:  6    Orders Placed This Encounter  Procedures  . Urine Culture  . MM Digital Diagnostic Bilat  . CBC with Differential  . Comp Met (CMET)  . Lipid Panel  . TSH  . Vitamin B12  . Vitamin D, 25-hydroxy  . POCT HgB A1C  . Glucose (CBG)  . Urinalysis Dipstick    Referral Orders  No referral(s) requested today   Kathe Becton,  MSN, FNP-BC [address], [place] [address] [phone] fax   Problem List Items Addressed This Visit      Cardiovascular and Mediastinum   HTN (hypertension)   Relevant Medications   amLODipine (NORVASC) 5 MG tablet   aspirin 81 MG EC tablet   hydrALAZINE (APRESOLINE) 25 MG tablet   hydrochlorothiazide (HYDRODIURIL)  25 MG tablet   losartan (COZAAR) 100 MG tablet   metoprolol succinate (TOPROL-XL) 100 MG 24 hr tablet   spironolactone (ALDACTONE) 25 MG tablet   Vitamin D, Ergocalciferol, (DRISDOL) 1.25 MG (50000 UT) CAPS capsule   nitroGLYCERIN (NITROSTAT) 0.4 MG SL tablet   Other Relevant Orders   Urinalysis Dipstick (Completed)     Endocrine   DM (diabetes mellitus) ([place]) - Primary   Relevant Medications   blood glucose meter kit and supplies KIT   aspirin 81 MG EC tablet   glipiZIDE (GLUCOTROL) 10 MG tablet   losartan (COZAAR) 100 MG tablet   metFORMIN (GLUCOPHAGE) 1000 MG tablet   Other Relevant Orders   POCT HgB A1C (Completed)   Glucose (CBG) (Completed)   Urinalysis Dipstick (Completed)   CBC with Differential (Completed)   Comp Met (CMET) (Completed)   Lipid Panel (Completed)   TSH (Completed)   Vitamin B12 (Completed)   Vitamin D, 25-hydroxy (Completed)     Other   Class 3 severe obesity due to excess calories with serious comorbidity and body mass index (BMI) of 45.0 to 49.9 in adult [facility])   Relevant Medications   glipiZIDE (GLUCOTROL) 10 MG tablet   metFORMIN (GLUCOPHAGE) 1000 MG tablet   Dyspnea on exertion   Relevant Medications   albuterol (VENTOLIN HFA) 108 (90 Base) MCG/ACT inhaler   Hyperglycemia   Relevant Medications   blood glucose meter kit and supplies KIT    Other Visit Diagnoses  Hemoglobin A1c greater than 9.0%       Relevant Medications   blood glucose meter kit and supplies KIT   Weight loss       Abnormal urinalysis       Relevant Orders   Urine Culture   Cervical cancer screening       Relevant Orders   MM Digital Diagnostic Bilat   Shortness of breath       Relevant Medications   albuterol (VENTOLIN HFA) 108 (90 Base) MCG/ACT inhaler   Anemia, unspecified type       Relevant Medications   vitamin B-12 (CYANOCOBALAMIN) 100 MCG tablet   ferrous sulfate 325 (65 FE) MG tablet   Follow up          Meds ordered this encounter  Medications  .  blood glucose meter kit and supplies KIT    Sig: Dispense based on patient and insurance preference. Use up to four times daily as directed. (FOR ICD-9 250.00, 250.01).    Dispense:  1 each    Refill:  0    Order Specific Question:   Number of strips    Answer:   100    Order Specific Question:   Number of lancets    Answer:   100  . albuterol (VENTOLIN HFA) 108 (90 Base) MCG/ACT inhaler    Sig: Inhale 2 puffs into the lungs every 4 (four) hours as needed for wheezing or shortness of breath (cough, shortness of breath or wheezing.).    Dispense:  8 g    Refill:  6  . amLODipine (NORVASC) 5 MG tablet    Sig: Take 1 tablet (5 mg total) by mouth daily.    Dispense:  30 tablet    Refill:  6  . aspirin 81 MG EC tablet    Sig: Take 1 tablet (81 mg total) by mouth daily.    Dispense:  30 tablet    Refill:  6  . ferrous sulfate 325 (65 FE) MG tablet    Sig: Take 1 tablet (325 mg total) by mouth 2 (two) times daily.    Dispense:  60 tablet    Refill:  6  . glipiZIDE (GLUCOTROL) 10 MG tablet    Sig: Take 1 tablet (10 mg total) by mouth 2 (two) times daily before a meal.    Dispense:  60 tablet    Refill:  6  . hydrALAZINE (APRESOLINE) 25 MG tablet    Sig: Take 1 tablet (25 mg total) by mouth every 8 (eight) hours.    Dispense:  90 tablet    Refill:  6  . hydrochlorothiazide (HYDRODIURIL) 25 MG tablet    Sig: Take 1 tablet (25 mg total) by mouth daily.    Dispense:  30 tablet    Refill:  6  . losartan (COZAAR) 100 MG tablet    Sig: Take 1 tablet (100 mg total) by mouth daily.    Dispense:  30 tablet    Refill:  6  . metFORMIN (GLUCOPHAGE) 1000 MG tablet    Sig: Take 1 tablet (1,000 mg total) by mouth 2 (two) times daily with a meal.    Dispense:  60 tablet    Refill:  6  . metoprolol succinate (TOPROL-XL) 100 MG 24 hr tablet    Sig: Take 1 tablet (100 mg total) by mouth daily. Take with or immediately following a meal.    Dispense:  30 tablet    Refill:  6  . spironolactone  (  ALDACTONE) 25 MG tablet    Sig: Take 1 tablet (25 mg total) by mouth daily.    Dispense:  30 tablet    Refill:  6  . Vitamin D, Ergocalciferol, (DRISDOL) 1.25 MG (50000 UT) CAPS capsule    Sig: Take 1 capsule (50,000 Units total) by mouth every 7 (seven) days.    Dispense:  5 capsule    Refill:  6  . nitroGLYCERIN (NITROSTAT) 0.4 MG SL tablet    Sig: Place 1 tablet (0.4 mg total) under the tongue every 5 (five) minutes x 3 doses as needed for chest pain.    Dispense:  100 tablet    Refill:  6    Follow-up: No follow-ups on file.    Azzie Glatter, FNP

## 2019-10-04 DIAGNOSIS — R634 Abnormal weight loss: Secondary | ICD-10-CM | POA: Insufficient documentation

## 2019-10-04 DIAGNOSIS — R7309 Other abnormal glucose: Secondary | ICD-10-CM | POA: Insufficient documentation

## 2019-10-04 LAB — COMPREHENSIVE METABOLIC PANEL
ALT: 22 IU/L (ref 0–32)
AST: 23 IU/L (ref 0–40)
Albumin/Globulin Ratio: 1.4 (ref 1.2–2.2)
Albumin: 4.6 g/dL (ref 3.8–4.9)
Alkaline Phosphatase: 67 IU/L (ref 39–117)
BUN/Creatinine Ratio: 16 (ref 9–23)
BUN: 14 mg/dL (ref 6–24)
Bilirubin Total: 0.6 mg/dL (ref 0.0–1.2)
CO2: 27 mmol/L (ref 20–29)
Calcium: 10 mg/dL (ref 8.7–10.2)
Chloride: 94 mmol/L — ABNORMAL LOW (ref 96–106)
Creatinine, Ser: 0.87 mg/dL (ref 0.57–1.00)
GFR calc Af Amer: 89 mL/min/{1.73_m2} (ref >59)
GFR calc non Af Amer: 77 mL/min/{1.73_m2} (ref >59)
Globulin, Total: 3.2 g/dL (ref 1.5–4.5)
Glucose: 269 mg/dL — ABNORMAL HIGH (ref 65–99)
Potassium: 4.2 mmol/L (ref 3.5–5.2)
Sodium: 136 mmol/L (ref 134–144)
Total Protein: 7.8 g/dL (ref 6.0–8.5)

## 2019-10-04 LAB — CBC WITH DIFFERENTIAL/PLATELET
Basophils Absolute: 0 10*3/uL (ref 0.0–0.2)
Basos: 0 %
EOS (ABSOLUTE): 0.1 10*3/uL (ref 0.0–0.4)
Eos: 2 %
Hematocrit: 41.7 % (ref 34.0–46.6)
Hemoglobin: 12.7 g/dL (ref 11.1–15.9)
Immature Grans (Abs): 0 10*3/uL (ref 0.0–0.1)
Immature Granulocytes: 0 %
Lymphocytes Absolute: 3 10*3/uL (ref 0.7–3.1)
Lymphs: 38 %
MCH: 22.3 pg — ABNORMAL LOW (ref 26.6–33.0)
MCHC: 30.5 g/dL — ABNORMAL LOW (ref 31.5–35.7)
MCV: 73 fL — ABNORMAL LOW (ref 79–97)
Monocytes Absolute: 0.7 10*3/uL (ref 0.1–0.9)
Monocytes: 9 %
Neutrophils Absolute: 4 10*3/uL (ref 1.4–7.0)
Neutrophils: 51 %
Platelets: 408 10*3/uL (ref 150–450)
RBC: 5.69 x10E6/uL — ABNORMAL HIGH (ref 3.77–5.28)
RDW: 14.2 % (ref 11.7–15.4)
WBC: 7.8 10*3/uL (ref 3.4–10.8)

## 2019-10-04 LAB — VITAMIN D 25 HYDROXY (VIT D DEFICIENCY, FRACTURES): Vit D, 25-Hydroxy: 32.2 ng/mL (ref 30.0–100.0)

## 2019-10-04 LAB — LIPID PANEL
Chol/HDL Ratio: 4 ratio (ref 0.0–4.4)
Cholesterol, Total: 203 mg/dL — ABNORMAL HIGH (ref 100–199)
HDL: 51 mg/dL (ref >39)
LDL Chol Calc (NIH): 137 mg/dL — ABNORMAL HIGH (ref 0–99)
Triglycerides: 83 mg/dL (ref 0–149)
VLDL Cholesterol Cal: 15 mg/dL (ref 5–40)

## 2019-10-04 LAB — TSH: TSH: 0.496 u[IU]/mL (ref 0.450–4.500)

## 2019-10-04 LAB — VITAMIN B12: Vitamin B-12: >2000 pg/mL — ABNORMAL HIGH (ref 232–1245)

## 2019-10-04 MED ORDER — LOSARTAN POTASSIUM 100 MG PO TABS: 100.0000 mg | ORAL_TABLET | Freq: Every day | ORAL | 6 refills | Status: DC

## 2019-10-04 MED ORDER — NITROGLYCERIN 0.4 MG SL SUBL: 0.4000 mg | SUBLINGUAL_TABLET | SUBLINGUAL | 6 refills | Status: DC | PRN

## 2019-10-04 MED ORDER — METOPROLOL SUCCINATE ER 100 MG PO TB24: 100.0000 mg | ORAL_TABLET | Freq: Every day | ORAL | 6 refills | Status: DC

## 2019-10-04 MED ORDER — METFORMIN HCL 1000 MG PO TABS: 1000.0000 mg | ORAL_TABLET | Freq: Two times a day (BID) | ORAL | 6 refills | Status: DC

## 2019-10-04 MED ORDER — HYDRALAZINE HCL 25 MG PO TABS: 25.0000 mg | ORAL_TABLET | Freq: Three times a day (TID) | ORAL | 6 refills | Status: DC

## 2019-10-04 MED ORDER — FERROUS SULFATE 325 (65 FE) MG PO TABS: 325.0000 mg | ORAL_TABLET | Freq: Two times a day (BID) | ORAL | 6 refills | Status: DC

## 2019-10-04 MED ORDER — ALBUTEROL SULFATE HFA 108 (90 BASE) MCG/ACT IN AERS: 2.0000 | INHALATION_SPRAY | RESPIRATORY_TRACT | 6 refills | Status: DC | PRN

## 2019-10-04 MED ORDER — SPIRONOLACTONE 25 MG PO TABS: 25.0000 mg | ORAL_TABLET | Freq: Every day | ORAL | 6 refills | Status: DC

## 2019-10-04 MED ORDER — ASPIRIN 81 MG PO TBEC: 81.0000 mg | DELAYED_RELEASE_TABLET | Freq: Every day | ORAL | 6 refills | Status: AC

## 2019-10-04 MED ORDER — GLIPIZIDE 10 MG PO TABS: 10.0000 mg | ORAL_TABLET | Freq: Two times a day (BID) | ORAL | 6 refills | Status: DC

## 2019-10-04 MED ORDER — AMLODIPINE BESYLATE 5 MG PO TABS: 5.0000 mg | ORAL_TABLET | Freq: Every day | ORAL | 6 refills | Status: DC

## 2019-10-04 MED ORDER — HYDROCHLOROTHIAZIDE 25 MG PO TABS: 25.0000 mg | ORAL_TABLET | Freq: Every day | ORAL | 6 refills | Status: DC

## 2019-10-04 MED ORDER — VITAMIN D (ERGOCALCIFEROL) 1.25 MG (50000 UNIT) PO CAPS: 50000.0000 [IU] | ORAL_CAPSULE | ORAL | 6 refills | Status: DC

## 2019-10-05 LAB — URINE CULTURE

## 2019-10-09 ENCOUNTER — Other Ambulatory Visit: Payer: Self-pay | Admitting: Family Medicine

## 2019-10-09 DIAGNOSIS — Z1231 Encounter for screening mammogram for malignant neoplasm of breast: Secondary | ICD-10-CM

## 2019-11-13 ENCOUNTER — Other Ambulatory Visit: Payer: Self-pay

## 2019-11-13 ENCOUNTER — Ambulatory Visit
Admission: RE | Admit: 2019-11-13 | Discharge: 2019-11-13 | Disposition: A | Discharge status: Home / Self Care | Payer: Managed Care, Other (non HMO) | Source: Ambulatory Visit | Attending: Family Medicine | Admitting: Family Medicine

## 2019-11-13 DIAGNOSIS — Z1231 Encounter for screening mammogram for malignant neoplasm of breast: Secondary | ICD-10-CM

## 2019-11-29 ENCOUNTER — Ambulatory Visit (INDEPENDENT_AMBULATORY_CARE_PROVIDER_SITE_OTHER): Payer: 59 | Admitting: Cardiology

## 2019-11-29 ENCOUNTER — Encounter: Payer: Self-pay | Admitting: *Deleted

## 2019-11-29 ENCOUNTER — Other Ambulatory Visit: Payer: Self-pay

## 2019-11-29 ENCOUNTER — Encounter: Payer: Self-pay | Admitting: Cardiology

## 2019-11-29 VITALS — BP 140/90 | HR 68 | Ht 68.0 in | Wt 307.8 lb

## 2019-11-29 DIAGNOSIS — I10 Essential (primary) hypertension: Secondary | ICD-10-CM

## 2019-11-29 DIAGNOSIS — E7800 Pure hypercholesterolemia, unspecified: Secondary | ICD-10-CM

## 2019-11-29 DIAGNOSIS — I428 Other cardiomyopathies: Secondary | ICD-10-CM

## 2019-11-29 NOTE — Patient Instructions (Signed)
Medication Instructions:  The current medical regimen is effective;  continue present plan and medications.  *If you need a refill on your cardiac medications before your next appointment, please call your pharmacy*  Follow-Up: At [facility], you and your health needs are our priority.  As part of our continuing mission to provide you with exceptional heart care, we have created designated Provider Care Teams.  These Care Teams include your primary Cardiologist (physician) and Advanced Practice Providers (APPs -  Physician Assistants and Nurse Practitioners) who all work together to provide you with the care you need, when you need it.  We recommend signing up for the patient portal called "MyChart".  Sign up information is provided on this After Visit Summary.  MyChart is used to connect with patients for Virtual Visits (Telemedicine).  Patients are able to view lab/test results, encounter notes, upcoming appointments, etc.  Non-urgent messages can be sent to your provider as well.   To learn more about what you can do with MyChart, go to [email or web address].    Your next appointment:   6 month(s)  The format for your next appointment:   In Person  Provider:   Lori Gerhardt, NP   Thank you for choosing [facility]!!      

## 2019-11-29 NOTE — Progress Notes (Signed)
Cardiology Office Note:    Date:  11/29/2019   ID:  Claudia [name], DOB 15-Mar-1966, MRN [id number]  PCP:  Azzie Glatter, FNP  Cardiologist:  Candee Furbish, MD  Electrophysiologist:  None   Referring MD: Azzie Glatter, FNP     History of Present Illness:    Claudia [name] is a 54 y.o. female follow-up of chronic systolic heart failure, dilated cardiomyopathy EF 40 to 45%, subsequently resolved to normal EF 60%.  Cardiac catheterization showed normal coronary arteries.  Has occasional joint pains muscle tightness and tightness.  Still struggling with weight, diabetes.  Sometimes has panic attacks.  Works for [employer].  She is scheduled to get her first Covid vaccine Saturday.  She is a little anxious about it.  We discussed.      Past Medical History:  Diagnosis Date  . Diabetes mellitus   . Fibroids   . Headache(784.0)   . History of echocardiogram    Echo 7/18: Moderate concentric LVH, EF 55-60, normal wall motion, grade 1 diastolic dysfunction, calcified aortic leaflets  . Hypercholesterolemia   . Hyperlipidemia   . Hypertension   . Osteoarthritis    , with mild meniscus tear, followed by orthopedics--Dr Gladstone Lighter 04/29/10  . Right knee pain   . Seizures ([place])    as child  . Vitamin D deficiency     Past Surgical History:  Procedure Laterality Date  . ANTERIOR CERVICAL DECOMP/DISCECTOMY FUSION N/A 09/07/2013   Procedure: ANTERIOR CERVICAL DECOMPRESSION/DISCECTOMY FUSION 1 LEVEL;  Surgeon: Sinclair Ship, MD;  Location: [facility];  Service: Orthopedics;  Laterality: N/A;  Anterior cervical decompression fusion, cervical 5-6 with instrumentation and allograft  . LAPAROSCOPIC ASSISTED VAGINAL HYSTERECTOMY  1/14  . LEFT HEART CATH AND CORONARY ANGIOGRAPHY N/A 04/25/2018   Procedure: LEFT HEART CATH AND CORONARY ANGIOGRAPHY;  Surgeon: Lorretta Harp, MD;  Location: [facility];  Service: Cardiovascular;  Laterality: N/A;  .  SHOULDER SURGERY Right 11   rotator cuff  . TUBAL LIGATION    . UMBILICAL HERNIA REPAIR      Current Medications: Current Meds  Medication Sig  . albuterol (VENTOLIN HFA) 108 (90 Base) MCG/ACT inhaler Inhale 2 puffs into the lungs every 4 (four) hours as needed for wheezing or shortness of breath (cough, shortness of breath or wheezing.).  Marland Kitchen amLODipine (NORVASC) 5 MG tablet Take 1 tablet (5 mg total) by mouth daily.  Marland Kitchen aspirin 81 MG EC tablet Take 1 tablet (81 mg total) by mouth daily.  . blood glucose meter kit and supplies KIT Dispense based on patient and insurance preference. Use up to four times daily as directed. (FOR ICD-9 250.00, 250.01).  . ferrous sulfate 325 (65 FE) MG tablet Take 1 tablet (325 mg total) by mouth 2 (two) times daily.  Marland Kitchen glipiZIDE (GLUCOTROL) 10 MG tablet Take 1 tablet (10 mg total) by mouth 2 (two) times daily before a meal.  . hydrALAZINE (APRESOLINE) 25 MG tablet Take 1 tablet (25 mg total) by mouth every 8 (eight) hours.  . hydrochlorothiazide (HYDRODIURIL) 25 MG tablet Take 1 tablet (25 mg total) by mouth daily.  Marland Kitchen losartan (COZAAR) 100 MG tablet Take 1 tablet (100 mg total) by mouth daily.  . metFORMIN (GLUCOPHAGE) 1000 MG tablet Take 1 tablet (1,000 mg total) by mouth 2 (two) times daily with a meal.  . metoprolol succinate (TOPROL-XL) 100 MG 24 hr tablet Take 1 tablet (100 mg total) by mouth daily. Take with or immediately  following a meal.  . nitroGLYCERIN (NITROSTAT) 0.4 MG SL tablet Place 1 tablet (0.4 mg total) under the tongue every 5 (five) minutes x 3 doses as needed for chest pain.  . Omega-3 Fatty Acids (FISH OIL) 1000 MG CAPS Take by mouth.  . spironolactone (ALDACTONE) 25 MG tablet Take 1 tablet (25 mg total) by mouth daily.  . vitamin B-12 (CYANOCOBALAMIN) 100 MCG tablet Take 100 mcg by mouth daily.  . Vitamin D, Ergocalciferol, (DRISDOL) 1.25 MG (50000 UT) CAPS capsule Take 1 capsule (50,000 Units total) by mouth every 7 (seven) days.      Allergies:   Crestor [rosuvastatin], Hydrocodone, Other, Oxycodone, Pravachol [pravastatin sodium], and Simvastatin   Social History   Socioeconomic History  . Marital status: Married    Spouse name: Not on file  . Number of children: Not on file  . Years of education: Not on file  . Highest education level: Not on file  Occupational History  . Not on file  Tobacco Use  . Smoking status: Never Smoker  . Smokeless tobacco: Never Used  Substance and Sexual Activity  . Alcohol use: No  . Drug use: No  . Sexual activity: Yes  Other Topics Concern  . Not on file  Social History Narrative  . Not on file   Social Determinants of Health   Financial Resource Strain:   . Difficulty of Paying Living Expenses: Not on file  Food Insecurity:   . Worried About Charity fundraiser in the Last Year: Not on file  . Ran Out of Food in the Last Year: Not on file  Transportation Needs:   . Lack of Transportation (Medical): Not on file  . Lack of Transportation (Non-Medical): Not on file  Physical Activity:   . Days of Exercise per Week: Not on file  . Minutes of Exercise per Session: Not on file  Stress:   . Feeling of Stress : Not on file  Social Connections:   . Frequency of Communication with Friends and Family: Not on file  . Frequency of Social Gatherings with Friends and Family: Not on file  . Attends Religious Services: Not on file  . Active Member of Clubs or Organizations: Not on file  . Attends Archivist Meetings: Not on file  . Marital Status: Not on file     Family History: The patient's family history includes CVA in her maternal grandmother; Diabetes in her maternal grandmother and mother; Hypercholesterolemia in her mother; Hypertension in her maternal grandmother, mother, and sister. There is no history of Heart attack.  ROS:   Please see the history of present illness.    No bleeding no syncope all other systems reviewed and are  negative.  EKGs/Labs/Other Studies Reviewed:    The following studies were reviewed today: Most recent echocardiogram showed EF of 60%.  EKG:  EKG is  ordered today.  The ekg ordered today demonstrates sinus rhythm 68 with no other abnormalities.  Recent Labs: 10/03/2019: ALT 22; BUN 14; Creatinine, Ser 0.87; Hemoglobin 12.7; Platelets 408; Potassium 4.2; Sodium 136; TSH 0.496  Recent Lipid Panel    Component Value Date/Time   CHOL 203 (H) 10/03/2019 1440   TRIG 83 10/03/2019 1440   HDL 51 10/03/2019 1440   CHOLHDL 4.0 10/03/2019 1440   CHOLHDL 3.8 02/25/2017 1536   VLDL 25 02/25/2017 1536   LDLCALC 137 (H) 10/03/2019 1440    Physical Exam:    VS:  BP 140/90   Pulse 68  Ht 5' 8"  (1.727 m)   Wt (!) 307 lb 12.8 oz (139.6 kg)   LMP 05/09/2012   SpO2 95%   BMI 46.80 kg/m     Wt Readings from Last 3 Encounters:  11/29/19 (!) 307 lb 12.8 oz (139.6 kg)  10/03/19 (!) 306 lb 9.6 oz (139.1 kg)  06/14/19 (!) 315 lb 12.8 oz (143.2 kg)     GEN: Overweight well nourished, well developed in no acute distress HEENT: Normal NECK: No JVD; No carotid bruits LYMPHATICS: No lymphadenopathy CARDIAC: RRR, no murmurs, rubs, gallops RESPIRATORY:  Clear to auscultation without rales, wheezing or rhonchi  ABDOMEN: Soft, non-tender, non-distended MUSCULOSKELETAL:  No edema; No deformity  SKIN: Warm and dry NEUROLOGIC:  Alert and oriented x 3 PSYCHIATRIC:  Normal affect   ASSESSMENT:    1. Essential hypertension   2. NICM (nonischemic cardiomyopathy) ([place])   3. Pure hypercholesterolemia   4. Morbid obesity due to excess calories ([facility])    PLAN:    In order of problems listed above:  Nonischemic cardiomyopathy, chronic systolic heart failure -Excellent resolution of EF, prior EF 40% now normal. -Continue with current medications.  Continue to control blood pressure.  Minimally elevated today.  Essential hypertension -She thought that she was having problems with carvedilol in the  past with tightness in her hands and joints.  We then switched her over to Toprol-XL.  Doing well with this medication change.  Hyperlipidemia -Statin intolerance.  No CAD.  Diabetes with hypertension -Continue to monitor hemoglobin A1c, Kathe Becton, FNP is watching.  Hemoglobin A1c had previously been 12.  She continues to work on this.  Morbid obesity -Continue to decrease carbohydrates weight loss.  This has been challenging.  Lori in 6 months, me in 12  Medication Adjustments/Labs and Tests Ordered: Current medicines are reviewed at length with the patient today.  Concerns regarding medicines are outlined above.  Orders Placed This Encounter  Procedures  . EKG 12-Lead   No orders of the defined types were placed in this encounter.   Patient Instructions  Medication Instructions:  The current medical regimen is effective;  continue present plan and medications.  *If you need a refill on your cardiac medications before your next appointment, please call your pharmacy*  Follow-Up: At [facility], you and your health needs are our priority.  As part of our continuing mission to provide you with exceptional heart care, we have created designated Provider Care Teams.  These Care Teams include your primary Cardiologist (physician) and Advanced Practice Providers (APPs -  Physician Assistants and Nurse Practitioners) who all work together to provide you with the care you need, when you need it.  We recommend signing up for the patient portal called "MyChart".  Sign up information is provided on this After Visit Summary.  MyChart is used to connect with patients for Virtual Visits (Telemedicine).  Patients are able to view lab/test results, encounter notes, upcoming appointments, etc.  Non-urgent messages can be sent to your provider as well.   To learn more about what you can do with MyChart, go to [email or web address].    Your next appointment:   6 month(s)  The format for  your next appointment:   In Person  Provider:   Truitt Merle, NP   Thank you for choosing [facility]!!         Signed, Candee Furbish, MD  11/29/2019 9:29 AM    [place]

## 2019-12-05 ENCOUNTER — Other Ambulatory Visit: Payer: Self-pay

## 2019-12-05 ENCOUNTER — Telehealth: Payer: Self-pay | Admitting: Family Medicine

## 2019-12-05 ENCOUNTER — Encounter (HOSPITAL_COMMUNITY): Payer: Self-pay | Admitting: Emergency Medicine

## 2019-12-05 ENCOUNTER — Emergency Department (HOSPITAL_COMMUNITY): Payer: 59

## 2019-12-05 ENCOUNTER — Emergency Department (HOSPITAL_COMMUNITY)
Admission: EM | Admit: 2019-12-05 | Discharge: 2019-12-05 | Disposition: A | Discharge status: Home / Self Care | Payer: 59 | Attending: Emergency Medicine | Admitting: Emergency Medicine

## 2019-12-05 DIAGNOSIS — J039 Acute tonsillitis, unspecified: Secondary | ICD-10-CM | POA: Diagnosis not present

## 2019-12-05 DIAGNOSIS — I10 Essential (primary) hypertension: Secondary | ICD-10-CM | POA: Diagnosis not present

## 2019-12-05 DIAGNOSIS — Z7984 Long term (current) use of oral hypoglycemic drugs: Secondary | ICD-10-CM | POA: Insufficient documentation

## 2019-12-05 DIAGNOSIS — Z7982 Long term (current) use of aspirin: Secondary | ICD-10-CM | POA: Diagnosis not present

## 2019-12-05 DIAGNOSIS — J029 Acute pharyngitis, unspecified: Secondary | ICD-10-CM | POA: Diagnosis present

## 2019-12-05 DIAGNOSIS — Z79899 Other long term (current) drug therapy: Secondary | ICD-10-CM | POA: Insufficient documentation

## 2019-12-05 DIAGNOSIS — E119 Type 2 diabetes mellitus without complications: Secondary | ICD-10-CM | POA: Diagnosis not present

## 2019-12-05 DIAGNOSIS — J020 Streptococcal pharyngitis: Secondary | ICD-10-CM | POA: Insufficient documentation

## 2019-12-05 LAB — CBC WITH DIFFERENTIAL/PLATELET
Abs Immature Granulocytes: 0.08 10*3/uL — ABNORMAL HIGH (ref 0.00–0.07)
Basophils Absolute: 0 10*3/uL (ref 0.0–0.1)
Basophils Relative: 0 %
Eosinophils Absolute: 0.1 10*3/uL (ref 0.0–0.5)
Eosinophils Relative: 1 %
HCT: 41.7 % (ref 36.0–46.0)
Hemoglobin: 12.5 g/dL (ref 12.0–15.0)
Immature Granulocytes: 1 %
Lymphocytes Relative: 13 %
Lymphs Abs: 2 10*3/uL (ref 0.7–4.0)
MCH: 22.1 pg — ABNORMAL LOW (ref 26.0–34.0)
MCHC: 30 g/dL (ref 30.0–36.0)
MCV: 73.8 fL — ABNORMAL LOW (ref 80.0–100.0)
Monocytes Absolute: 1.2 10*3/uL — ABNORMAL HIGH (ref 0.1–1.0)
Monocytes Relative: 8 %
Neutro Abs: 12.1 10*3/uL — ABNORMAL HIGH (ref 1.7–7.7)
Neutrophils Relative %: 77 %
Platelets: 361 10*3/uL (ref 150–400)
RBC: 5.65 MIL/uL — ABNORMAL HIGH (ref 3.87–5.11)
RDW: 15.7 % — ABNORMAL HIGH (ref 11.5–15.5)
WBC: 15.5 10*3/uL — ABNORMAL HIGH (ref 4.0–10.5)
nRBC: 0 % (ref 0.0–0.2)

## 2019-12-05 LAB — BASIC METABOLIC PANEL
Anion gap: 12 (ref 5–15)
BUN: 5 mg/dL — ABNORMAL LOW (ref 6–20)
CO2: 26 mmol/L (ref 22–32)
Calcium: 9.2 mg/dL (ref 8.9–10.3)
Chloride: 100 mmol/L (ref 98–111)
Creatinine, Ser: 0.63 mg/dL (ref 0.44–1.00)
GFR calc Af Amer: >60 mL/min (ref >60)
GFR calc non Af Amer: >60 mL/min (ref >60)
Glucose, Bld: 215 mg/dL — ABNORMAL HIGH (ref 70–99)
Potassium: 3.8 mmol/L (ref 3.5–5.1)
Sodium: 138 mmol/L (ref 135–145)

## 2019-12-05 LAB — GROUP A STREP BY PCR: Group A Strep by PCR: DETECTED — AB

## 2019-12-05 MED ORDER — CLINDAMYCIN HCL 150 MG PO CAPS: 300.0000 mg | ORAL_CAPSULE | Freq: Three times a day (TID) | ORAL | 0 refills | Status: AC

## 2019-12-05 MED ORDER — DEXAMETHASONE SODIUM PHOSPHATE 10 MG/ML IJ SOLN
10.0000 mg | Freq: Once | INTRAMUSCULAR | Status: AC
  Administered 2019-12-05: 10 mg via INTRAVENOUS
  Filled 2019-12-05: qty 1

## 2019-12-05 MED ORDER — CLINDAMYCIN PHOSPHATE 600 MG/50ML IV SOLN
600.0000 mg | Freq: Once | INTRAVENOUS | Status: AC
  Administered 2019-12-05: 600 mg via INTRAVENOUS
  Filled 2019-12-05: qty 50

## 2019-12-05 MED ORDER — IOHEXOL 300 MG/ML  SOLN
75.0000 mL | Freq: Once | INTRAMUSCULAR | Status: AC | PRN
  Administered 2019-12-05: 75 mL via INTRAVENOUS

## 2019-12-05 MED ORDER — SODIUM CHLORIDE 0.9 % IV BOLUS
500.0000 mL | Freq: Once | INTRAVENOUS | Status: AC
  Administered 2019-12-05: 500 mL via INTRAVENOUS

## 2019-12-05 NOTE — ED Triage Notes (Signed)
Pt reports getting covid vaccine 3/6. Monday afternoon, she noticed her throat was swelling and pain. Able to eat and drink. No tongue swelling noticed. Has tried tylenol and alka seltzer.

## 2019-12-05 NOTE — Discharge Instructions (Addendum)
You were seen in the emergency department today for a sore and swollen throat.  Your strep test was positive.  Your CT scan showed inflammation throughout your tonsils/throat as well as some lymph nodes.  Your labs show that your white blood cell count was elevated which frequently occurs with infection.  Your blood sugar was also elevated in the 200s.  You were given IV antibiotics as well as an IV steroid.  Please be sure to monitor your blood sugar closely at home.  Should you start to experience increased thirst, increased urination, vomiting, abdominal pain or other concerning symptoms return to the emergency department immediately as steroids can elevate your blood sugar.  We are sending you home with clindamycin to take for the infection.  Please take all of your antibiotics until finished. You may develop abdominal discomfort or diarrhea from the antibiotic.  You may help offset this with probiotics which you can buy at the store (ask your pharmacist if unable to find) or get probiotics in the form of eating yogurt. Do not eat or take the probiotics until 2 hours after your antibiotic. If you are unable to tolerate these side effects follow-up with your primary care provider or return to the emergency department.   If you begin to experience any blistering, rashes, swelling, or difficulty breathing seek medical care for evaluation of potentially more serious side effects.   Please be aware that this medication may interact with other medications you are taking, please be sure to discuss your medication list with your pharmacist. If you are taking birth control the antibiotic will deactivate your birth control for 2 weeks. If on coumadin the antibiotic will effect your coumadin level.    Please follow-up with your primary care provider within 3 days for reevaluation.  Return to the emergency department for new or worsening symptoms including but not limited to inability to swallow, drooling, change  in your voice, trouble breathing, trouble moving your neck, trouble opening your mouth, or any other concerns.

## 2019-12-05 NOTE — Telephone Encounter (Signed)
Kathe Becton NP advised patient.

## 2019-12-05 NOTE — ED Notes (Signed)
Pt discharge instructions and prescription reviewed with the patient. The patient verbalized understanding of both. Pt discharged. 

## 2019-12-05 NOTE — ED Provider Notes (Addendum)
[facility] EMERGENCY DEPARTMENT Provider Note   CSN: [id number] Arrival date & time: 12/05/19  1209     History Chief Complaint  Patient presents with   Sore Throat    Claudia [name] is a 54 y.o. female with a hx of diabetes mellitus, hypertension, hypercholesterolemia, obesity, & CAD who presents to the ED with complaints of throat swelling since yesterday. Patient states that her throat feels swollen down into her neck with associated discomfort, nasal congestion, and voice change. Worse with swallowing. No alleviating factors. She feels she is having trouble breathing due to the swelling when she tries to chew. Denies fever, chills, N/V, cough, abdominal pain, chest pain, ear pain, lip swelling, intra-oral swelling, or rash. Had COVID vaccine 12/02/19.  HPI     Past Medical History:  Diagnosis Date   Diabetes mellitus    Fibroids    Headache(784.0)    History of echocardiogram    Echo 7/18: Moderate concentric LVH, EF 55-60, normal wall motion, grade 1 diastolic dysfunction, calcified aortic leaflets   Hypercholesterolemia    Hyperlipidemia    Hypertension    Osteoarthritis    , with mild meniscus tear, followed by orthopedics--Dr Gladstone Lighter 04/29/10   Right knee pain    Seizures ([place])    as child   Vitamin D deficiency     Patient Active Problem List   Diagnosis Date Noted   Hemoglobin A1c greater than 9.0% 10/04/2019   Weight loss 10/04/2019   Hx of non-ST elevation myocardial infarction (NSTEMI) 06/16/2019   Class 3 severe obesity due to excess calories with serious comorbidity and body mass index (BMI) of 45.0 to 49.9 in adult ([place]) 06/16/2019   Hyperglycemia 06/16/2019   Hypertensive heart disease with systolic congestive heart failure (HCC)    NSTEMI (non-ST elevated myocardial infarction) ([place]) 04/24/2018   ACS (acute coronary syndrome) ([address]) 04/24/2018   Chronic pain of left knee 07/15/2017   Chronic pain of right knee  07/15/2017   Unilateral primary osteoarthritis, left knee 07/15/2017   Unilateral primary osteoarthritis, right knee 07/15/2017   Chest pain    Benign essential HTN    Cardiomyopathy ([place]) 03/16/2014   Radiculopathy 09/07/2013   Hypokalemia 06/02/2012   HTN (hypertension) 06/01/2012   DM (diabetes mellitus) ([place]) 06/01/2012   Hyperlipidemia 06/01/2012   Dyspnea on exertion 06/01/2012   Morbid obesity ([place]) 06/01/2012    Past Surgical History:  Procedure Laterality Date   ANTERIOR CERVICAL DECOMP/DISCECTOMY FUSION N/A 09/07/2013   Procedure: ANTERIOR CERVICAL DECOMPRESSION/DISCECTOMY FUSION 1 LEVEL;  Surgeon: Sinclair Ship, MD;  Location: [facility];  Service: Orthopedics;  Laterality: N/A;  Anterior cervical decompression fusion, cervical 5-6 with instrumentation and allograft   LAPAROSCOPIC ASSISTED VAGINAL HYSTERECTOMY  1/14   LEFT HEART CATH AND CORONARY ANGIOGRAPHY N/A 04/25/2018   Procedure: LEFT HEART CATH AND CORONARY ANGIOGRAPHY;  Surgeon: Lorretta Harp, MD;  Location: [facility];  Service: Cardiovascular;  Laterality: N/A;   SHOULDER SURGERY Right 11   rotator cuff   TUBAL LIGATION     UMBILICAL HERNIA REPAIR       OB History    Gravida  4   Para  4   Term  3   Preterm  1   AB      Living  3     SAB      TAB      Ectopic      Multiple      Live Births  Family History  Problem Relation Age of Onset   Hypertension Mother    Diabetes Mother    Hypercholesterolemia Mother    Hypertension Sister    CVA Maternal Grandmother    Hypertension Maternal Grandmother    Diabetes Maternal Grandmother    Heart attack Neg Hx     Social History   Tobacco Use   Smoking status: Never Smoker   Smokeless tobacco: Never Used  Substance Use Topics   Alcohol use: No   Drug use: No    Home Medications Prior to Admission medications   Medication Sig Start Date End Date Taking? Authorizing Provider    albuterol (VENTOLIN HFA) 108 (90 Base) MCG/ACT inhaler Inhale 2 puffs into the lungs every 4 (four) hours as needed for wheezing or shortness of breath (cough, shortness of breath or wheezing.). 10/04/19   Azzie Glatter, FNP  amLODipine (NORVASC) 5 MG tablet Take 1 tablet (5 mg total) by mouth daily. 10/04/19   Azzie Glatter, FNP  aspirin 81 MG EC tablet Take 1 tablet (81 mg total) by mouth daily. 10/04/19   Azzie Glatter, FNP  blood glucose meter kit and supplies KIT Dispense based on patient and insurance preference. Use up to four times daily as directed. (FOR ICD-9 250.00, 250.01). 10/03/19   Azzie Glatter, FNP  ferrous sulfate 325 (65 FE) MG tablet Take 1 tablet (325 mg total) by mouth 2 (two) times daily. 10/04/19   Azzie Glatter, FNP  glipiZIDE (GLUCOTROL) 10 MG tablet Take 1 tablet (10 mg total) by mouth 2 (two) times daily before a meal. 10/04/19   Azzie Glatter, FNP  hydrALAZINE (APRESOLINE) 25 MG tablet Take 1 tablet (25 mg total) by mouth every 8 (eight) hours. 10/04/19   Azzie Glatter, FNP  hydrochlorothiazide (HYDRODIURIL) 25 MG tablet Take 1 tablet (25 mg total) by mouth daily. 10/04/19   Azzie Glatter, FNP  losartan (COZAAR) 100 MG tablet Take 1 tablet (100 mg total) by mouth daily. 10/04/19   Azzie Glatter, FNP  metFORMIN (GLUCOPHAGE) 1000 MG tablet Take 1 tablet (1,000 mg total) by mouth 2 (two) times daily with a meal. 10/04/19   Azzie Glatter, FNP  metoprolol succinate (TOPROL-XL) 100 MG 24 hr tablet Take 1 tablet (100 mg total) by mouth daily. Take with or immediately following a meal. 10/04/19 05/01/20  Azzie Glatter, FNP  nitroGLYCERIN (NITROSTAT) 0.4 MG SL tablet Place 1 tablet (0.4 mg total) under the tongue every 5 (five) minutes x 3 doses as needed for chest pain. 10/04/19   Azzie Glatter, FNP  Omega-3 Fatty Acids (FISH OIL) 1000 MG CAPS Take by mouth.    [provider]  spironolactone (ALDACTONE) 25 MG tablet Take 1 tablet (25 mg total) by  mouth daily. 10/04/19   Azzie Glatter, FNP  vitamin B-12 (CYANOCOBALAMIN) 100 MCG tablet Take 100 mcg by mouth daily.    [provider]  Vitamin D, Ergocalciferol, (DRISDOL) 1.25 MG (50000 UT) CAPS capsule Take 1 capsule (50,000 Units total) by mouth every 7 (seven) days. 10/04/19   Azzie Glatter, FNP    Allergies    Crestor [rosuvastatin], Hydrocodone, Other, Oxycodone, Pravachol [pravastatin sodium], and Simvastatin  Review of Systems   Review of Systems  Constitutional: Negative for chills and fever.  HENT: Positive for congestion, sore throat, trouble swallowing and voice change. Negative for ear pain.   Respiratory: Positive for shortness of breath (w/ chewing). Negative for cough.  Cardiovascular: Negative for chest pain.  Gastrointestinal: Negative for abdominal pain, nausea and vomiting.  Neurological: Negative for syncope.  All other systems reviewed and are negative.   Physical Exam Updated Vital Signs BP (!) 146/95 (BP Location: Right Arm)    Pulse (!) 109    Temp 98.5 F (36.9 C) (Oral)    Resp 16    Ht 5' 8"  (1.727 m)    Wt (!) 151.5 kg    LMP 05/09/2012    SpO2 95%    BMI 50.78 kg/m   Physical Exam Vitals and nursing note reviewed.  Constitutional:      General: She is not in acute distress.    Appearance: She is well-developed.  HENT:     Head: Normocephalic and atraumatic.     Right Ear: Ear canal normal. Tympanic membrane is not perforated, erythematous, retracted or bulging.     Left Ear: Ear canal normal. Tympanic membrane is not perforated, erythematous, retracted or bulging.     Ears:     Comments: No mastoid erythema/swelling/tenderness.     Nose:     Right Sinus: No maxillary sinus tenderness or frontal sinus tenderness.     Left Sinus: No maxillary sinus tenderness or frontal sinus tenderness.     Mouth/Throat:     Comments: Exam with somewhat limited visualization, visible portion of posterior oropharynx notable for erythema & swelling  which is asymmetric L>R. Uvula fairly midline. Tonsillar swelling present as well. Voice is somewhat muffled.  Patient tolerating own secretions without difficulty. No trismus. No drooling. No swelling beneath the tongue, submandibular compartment is soft. No angioedema.  Eyes:     General:        Right eye: No discharge.        Left eye: No discharge.     Conjunctiva/sclera: Conjunctivae normal.     Pupils: Pupils are equal, round, and reactive to light.  Cardiovascular:     Rate and Rhythm: Regular rhythm. Tachycardia present.     Heart sounds: No murmur.  Pulmonary:     Effort: Pulmonary effort is normal. No respiratory distress.     Breath sounds: Normal breath sounds. No wheezing, rhonchi or rales.  Abdominal:     General: There is no distension.     Palpations: Abdomen is soft.     Tenderness: There is no abdominal tenderness.  Musculoskeletal:     Cervical back: Normal range of motion and neck supple. No edema or rigidity.  Lymphadenopathy:     Cervical: Cervical adenopathy present.  Skin:    General: Skin is warm and dry.     Findings: No rash.  Neurological:     Mental Status: She is alert.  Psychiatric:        Behavior: Behavior normal.     ED Results / Procedures / Treatments   Labs (all labs ordered are listed, but only abnormal results are displayed) Labs Reviewed  GROUP A STREP BY PCR - Abnormal; Notable for the following components:      Result Value   Group A Strep by PCR DETECTED (*)    All other components within normal limits  CBC WITH DIFFERENTIAL/PLATELET - Abnormal; Notable for the following components:   WBC 15.5 (*)    RBC 5.65 (*)    MCV 73.8 (*)    MCH 22.1 (*)    RDW 15.7 (*)    Neutro Abs 12.1 (*)    Monocytes Absolute 1.2 (*)    Abs Immature Granulocytes 0.08 (*)  All other components within normal limits  BASIC METABOLIC PANEL - Abnormal; Notable for the following components:   Glucose, Bld 215 (*)    BUN 5 (*)    All other  components within normal limits    EKG None  Radiology CT Soft Tissue Neck W Contrast  Result Date: 12/05/2019 CLINICAL DATA:  Throat swelling. Sore throat. Rule out epiglottitis or tonsillitis. COVID vaccine 12/02/2019 EXAM: CT NECK WITH CONTRAST TECHNIQUE: Multidetector CT imaging of the neck was performed using the standard protocol following the bolus administration of intravenous contrast. CONTRAST:  75 mL Omnipaque 300 IV COMPARISON:  None. FINDINGS: Pharynx and larynx: Massive enlargement of the adenoid. Massive enlargement of both tonsils. Nasopharynx significantly narrowed. Oropharynx also narrowed by the large tonsils. Larynx normal. Epiglottis normal. Salivary glands: No inflammation, mass, or stone. Thyroid: Negative Lymph nodes: Multiple enlarged lymph nodes in the neck bilaterally. Right level 2 lymph node 16 mm. Multiple 1 cm lymph nodes around the right submandibular gland and in the posterior neck. Left level 2 lymph node 20 mm. Multiple 1 cm posterior lymph nodes, left greater than right. Vascular: Normal vascular enhancement Limited intracranial: Negative Visualized orbits: Negative Mastoids and visualized paranasal sinuses: Mild mucosal edema paranasal sinuses. Mild right mastoid effusion. Skeleton: ACDF at C5-6.  No acute skeletal abnormality. Upper chest: Negative lung apices. Other: None IMPRESSION: Massive enlargement of the adenoid and tonsils causing significant narrowing of the nasopharynx and oropharynx. Bilateral cervical adenopathy most likely due to acute pharyngitis. Negative for parapharyngeal abscess. Electronically Signed   By: Franchot Gallo M.D.   On: 12/05/2019 16:56    Procedures Procedures (including critical care time)  Medications Ordered in ED Medications - No data to display  ED Course  I have reviewed the triage vital signs and the nursing notes.  Pertinent labs & imaging results that were available during my care of the patient were reviewed by me and  considered in my medical decision making (see chart for details).    MDM Rules/Calculators/A&P                     Patient presents to the ED with throat swelling & soreness since yesterday. Mild tachycardia, BP elevated- doubt HTN emergency. Exam with pharyngitis/tonsillitis, somewhat suspicious for PTA, but not necessarily classic for it with some difficulty with visualization. Will proceed with labs & CT soft tissue neck.  CBC: Leukocytosis with left shift.  No anemia. BMP: Hyperglycemia without findings of DKA.  No significant electrolyte derangement. Strep test: Positive CT soft tissue neck: Massive enlargement of the adenoid and tonsils causing significant narrowing of the nasopharynx and oropharynx. Bilateral cervical adenopathy most likely due to acute pharyngitis. Negative for parapharyngeal abscess  Given IV clindamycin in the emergency department.  We discussed risk/benefit of Decadron given her history of diabetes at length, patient would prefer to receive decadron which I feel is reasonable, discussed strict return precautions in this regards.   Patient feeling improved. Tolerating PO. Appears appropriate for discharge home. Will discharge home with clindamycin given extensiveness on CT- Dr. Rex Kras in agreement.  I discussed results, treatment plan, need for follow-up, and return precautions with the patient. Provided opportunity for questions, patient confirmed understanding and is in agreement with plan. Findings and plan of care discussed with supervising physician Dr. Rex Kras who is in agreement.   Final Clinical Impression(s) / ED Diagnoses Final diagnoses:  Tonsillitis  Strep throat    Rx / DC Orders ED Discharge Orders  Ordered    clindamycin (CLEOCIN) 150 MG capsule  3 times daily     12/05/19 1754           Annasofia Pohl, Glynda Jaeger, PA-C 12/05/19 2115    Amaryllis Dyke, PA-C 12/05/19 2118    Little, Wenda Overland, MD 12/06/19 [id number]

## 2019-12-05 NOTE — ED Notes (Signed)
Patient transported to CT 

## 2020-01-01 ENCOUNTER — Ambulatory Visit (INDEPENDENT_AMBULATORY_CARE_PROVIDER_SITE_OTHER): Payer: 59 | Admitting: Family Medicine

## 2020-01-01 ENCOUNTER — Other Ambulatory Visit: Payer: Self-pay

## 2020-01-01 ENCOUNTER — Encounter: Payer: Self-pay | Admitting: Family Medicine

## 2020-01-01 VITALS — BP 162/88 | HR 97 | Temp 98.5°F | Ht 68.0 in | Wt 309.6 lb

## 2020-01-01 DIAGNOSIS — J039 Acute tonsillitis, unspecified: Secondary | ICD-10-CM

## 2020-01-01 DIAGNOSIS — I10 Essential (primary) hypertension: Secondary | ICD-10-CM

## 2020-01-01 DIAGNOSIS — Z6841 Body Mass Index (BMI) 40.0 and over, adult: Secondary | ICD-10-CM

## 2020-01-01 DIAGNOSIS — R0602 Shortness of breath: Secondary | ICD-10-CM | POA: Diagnosis not present

## 2020-01-01 DIAGNOSIS — E1159 Type 2 diabetes mellitus with other circulatory complications: Secondary | ICD-10-CM

## 2020-01-01 DIAGNOSIS — Z09 Encounter for follow-up examination after completed treatment for conditions other than malignant neoplasm: Secondary | ICD-10-CM | POA: Diagnosis not present

## 2020-01-01 DIAGNOSIS — E66813 Obesity, class 3: Secondary | ICD-10-CM

## 2020-01-01 DIAGNOSIS — R7309 Other abnormal glucose: Secondary | ICD-10-CM

## 2020-01-01 DIAGNOSIS — R739 Hyperglycemia, unspecified: Secondary | ICD-10-CM

## 2020-01-01 DIAGNOSIS — E119 Type 2 diabetes mellitus without complications: Secondary | ICD-10-CM

## 2020-01-01 LAB — POCT URINALYSIS DIPSTICK
Bilirubin, UA: NEGATIVE
Blood, UA: NEGATIVE
Glucose, UA: NEGATIVE
Ketones, UA: NEGATIVE
Leukocytes, UA: NEGATIVE
Nitrite, UA: NEGATIVE
Protein, UA: NEGATIVE
Spec Grav, UA: 1.02 (ref 1.010–1.025)
Urobilinogen, UA: 2 E.U./dL — AB
pH, UA: 8.5 — AB (ref 5.0–8.0)

## 2020-01-01 LAB — GLUCOSE, POCT (MANUAL RESULT ENTRY): POC Glucose: 100 mg/dl — AB (ref 70–99)

## 2020-01-01 LAB — POCT GLYCOSYLATED HEMOGLOBIN (HGB A1C): Hemoglobin A1C: 9.4 % — AB (ref 4.0–5.6)

## 2020-01-01 MED ORDER — CLONIDINE HCL 0.1 MG PO TABS
0.1000 mg | ORAL_TABLET | Freq: Once | ORAL | Status: AC
  Administered 2020-01-01: 14:00:00 0.1 mg via ORAL

## 2020-01-01 NOTE — Progress Notes (Signed)
Patient [facility] Follow Up  Subjective:  Patient ID: Claudia [name], female    DOB: Dec 31, 1965  Age: 54 y.o. MRN: [id number]  CC:  Chief Complaint  Patient presents with  . Hospitalization Follow-up    ED 12/05/2019 Tonsillitis  . Follow-up    DM    HPI Claudia [name] is a 54 year old female who presents for Hospital Follow Up today.    Past Medical History:  Diagnosis Date  . Diabetes mellitus   . Fibroids   . Headache(784.0)   . History of echocardiogram    Echo 7/18: Moderate concentric LVH, EF 55-60, normal wall motion, grade 1 diastolic dysfunction, calcified aortic leaflets  . Hypercholesterolemia   . Hyperlipidemia   . Hypertension   . Osteoarthritis    , with mild meniscus tear, followed by orthopedics--Dr Gladstone Lighter 04/29/10  . Right knee pain   . Seizures ([place])    as child  . Vitamin D deficiency    Current Status: Since her last office visit, she has had an ED visit on 12/05/2019 for Tonsillitis and trouble breathing after receiving 1st COVID19 vaccination a few days prior. She is did receive 2nd dose of vaccine. Today, she is doing well with no complaints. Her most recent normal range of preprandial blood glucose levels have been between 100-120. She has seen low range of 95 and high of 120 since his last office visit. She denies fatigue, frequent urination, blurred vision, excessive hunger, excessive thirst, weight gain, weight loss, and poor wound healing. She continues to check her feet regularly. She has adapted a healthier diet and is now increasing her physical activity. Her blood pressures are elevated today, as she states that she has not taken her hypertensive medications as of yet today. She denies visual changes, chest pain, cough, shortness of breath, heart palpitations, and falls. She has occasional headaches and dizziness with position changes. Denies severe headaches, confusion, seizures, double vision, and  blurred vision, nausea and vomiting. She denies fevers, chills, recent infections, weight loss, and night sweats. No reports of GI problems such as diarrhea, and constipation. She has no reports of blood in stools, dysuria and hematuria. No depression or anxiety reported today. She denies suicidal ideations, homicidal ideations, or auditory hallucinations. He is taking all medications as prescribed. She denies pain today. She continues to follow up with Cardiology in 3 months.   Past Surgical History:  Procedure Laterality Date  . ANTERIOR CERVICAL DECOMP/DISCECTOMY FUSION N/A 09/07/2013   Procedure: ANTERIOR CERVICAL DECOMPRESSION/DISCECTOMY FUSION 1 LEVEL;  Surgeon: Sinclair Ship, MD;  Location: [facility];  Service: Orthopedics;  Laterality: N/A;  Anterior cervical decompression fusion, cervical 5-6 with instrumentation and allograft  . LAPAROSCOPIC ASSISTED VAGINAL HYSTERECTOMY  1/14  . LEFT HEART CATH AND CORONARY ANGIOGRAPHY N/A 04/25/2018   Procedure: LEFT HEART CATH AND CORONARY ANGIOGRAPHY;  Surgeon: Lorretta Harp, MD;  Location: [facility];  Service: Cardiovascular;  Laterality: N/A;  . SHOULDER SURGERY Right 11   rotator cuff  . TUBAL LIGATION    . UMBILICAL HERNIA REPAIR      Family History  Problem Relation Age of Onset  . Hypertension Mother   . Diabetes Mother   . Hypercholesterolemia Mother   . Hypertension Sister   . CVA Maternal Grandmother   . Hypertension Maternal Grandmother   . Diabetes Maternal Grandmother   . Heart attack Neg Hx     Social History  Socioeconomic History  . Marital status: Married    Spouse name: Not on file  . Number of children: Not on file  . Years of education: Not on file  . Highest education level: Not on file  Occupational History  . Not on file  Tobacco Use  . Smoking status: Never Smoker  . Smokeless tobacco: Never Used  Substance and Sexual Activity  . Alcohol use: No  . Drug use: No  . Sexual activity: Yes    Other Topics Concern  . Not on file  Social History Narrative  . Not on file   Social Determinants of Health   Financial Resource Strain:   . Difficulty of Paying Living Expenses:   Food Insecurity:   . Worried About Charity fundraiser in the Last Year:   . Arboriculturist in the Last Year:   Transportation Needs:   . Film/video editor (Medical):   Marland Kitchen Lack of Transportation (Non-Medical):   Physical Activity:   . Days of Exercise per Week:   . Minutes of Exercise per Session:   Stress:   . Feeling of Stress :   Social Connections:   . Frequency of Communication with Friends and Family:   . Frequency of Social Gatherings with Friends and Family:   . Attends Religious Services:   . Active Member of Clubs or Organizations:   . Attends Archivist Meetings:   Marland Kitchen Marital Status:   Intimate Partner Violence:   . Fear of Current or Ex-Partner:   . Emotionally Abused:   Marland Kitchen Physically Abused:   . Sexually Abused:     Outpatient Medications Prior to Visit  Medication Sig Dispense Refill  . albuterol (VENTOLIN HFA) 108 (90 Base) MCG/ACT inhaler Inhale 2 puffs into the lungs every 4 (four) hours as needed for wheezing or shortness of breath (cough, shortness of breath or wheezing.). 8 g 6  . amLODipine (NORVASC) 5 MG tablet Take 1 tablet (5 mg total) by mouth daily. 30 tablet 6  . aspirin 81 MG EC tablet Take 1 tablet (81 mg total) by mouth daily. 30 tablet 6  . blood glucose meter kit and supplies KIT Dispense based on patient and insurance preference. Use up to four times daily as directed. (FOR ICD-9 250.00, 250.01). 1 each 0  . ferrous sulfate 325 (65 FE) MG tablet Take 1 tablet (325 mg total) by mouth 2 (two) times daily. 60 tablet 6  . glipiZIDE (GLUCOTROL) 10 MG tablet Take 1 tablet (10 mg total) by mouth 2 (two) times daily before a meal. 60 tablet 6  . hydrALAZINE (APRESOLINE) 25 MG tablet Take 1 tablet (25 mg total) by mouth every 8 (eight) hours. 90 tablet 6  .  hydrochlorothiazide (HYDRODIURIL) 25 MG tablet Take 1 tablet (25 mg total) by mouth daily. 30 tablet 6  . losartan (COZAAR) 100 MG tablet Take 1 tablet (100 mg total) by mouth daily. 30 tablet 6  . metFORMIN (GLUCOPHAGE) 1000 MG tablet Take 1 tablet (1,000 mg total) by mouth 2 (two) times daily with a meal. 60 tablet 6  . metoprolol succinate (TOPROL-XL) 100 MG 24 hr tablet Take 1 tablet (100 mg total) by mouth daily. Take with or immediately following a meal. 30 tablet 6  . Omega-3 Fatty Acids (FISH OIL) 1000 MG CAPS Take by mouth.    . spironolactone (ALDACTONE) 25 MG tablet Take 1 tablet (25 mg total) by mouth daily. 30 tablet 6  . vitamin B-12 (  CYANOCOBALAMIN) 100 MCG tablet Take 100 mcg by mouth daily.    . Vitamin D, Ergocalciferol, (DRISDOL) 1.25 MG (50000 UT) CAPS capsule Take 1 capsule (50,000 Units total) by mouth every 7 (seven) days. 5 capsule 6  . nitroGLYCERIN (NITROSTAT) 0.4 MG SL tablet Place 1 tablet (0.4 mg total) under the tongue every 5 (five) minutes x 3 doses as needed for chest pain. (Patient not taking: Reported on 01/01/2020) 100 tablet 6   No facility-administered medications prior to visit.    Allergies  Allergen Reactions  . Crestor [Rosuvastatin]     MUSCLE ACHES HEADACHES   . Hydrocodone Itching  . Other Hives, Swelling and Other (See Comments)    Vision changes; ALLERGIC TO ALL NUTS EXCEPT PEANUTS  . Oxycodone Itching  . Pravachol [Pravastatin Sodium] Itching    Joint pain  . Simvastatin Hives    ROS Review of Systems  Constitutional: Negative.   HENT: Negative.   Eyes: Negative.   Respiratory: Negative.   Cardiovascular: Negative.   Gastrointestinal: Negative.   Endocrine: Negative.   Genitourinary: Negative.   Musculoskeletal: Positive for arthralgias (generalized joint pain).  Skin: Negative.   Allergic/Immunologic: Negative.   Neurological: Positive for dizziness (occasional ) and headaches (occasional ).  Hematological: Negative.     Psychiatric/Behavioral: Negative.    Objective:    Physical Exam  Constitutional: She is oriented to person, place, and time. She appears well-developed and well-nourished.  HENT:  Head: Normocephalic and atraumatic.  Eyes: Conjunctivae are normal.  Cardiovascular: Normal rate, regular rhythm, normal heart sounds and intact distal pulses.  Pulmonary/Chest: Effort normal and breath sounds normal.  Abdominal: Soft. Bowel sounds are normal. She exhibits distension (obese).  Musculoskeletal:        General: Normal range of motion.     Cervical back: Normal range of motion and neck supple.  Neurological: She is alert and oriented to person, place, and time. She has normal reflexes.  Skin: Skin is warm and dry.  Psychiatric: She has a normal mood and affect. Her behavior is normal. Judgment and thought content normal.  Nursing note and vitals reviewed.   BP (!) 170/92   Pulse 97   Temp 98.5 F (36.9 C)   Ht 5' 8"  (1.727 m)   Wt (!) 309 lb 9.6 oz (140.4 kg)   LMP 05/09/2012   SpO2 99%   BMI 47.07 kg/m  Wt Readings from Last 3 Encounters:  01/01/20 (!) 309 lb 9.6 oz (140.4 kg)  12/05/19 (!) 334 lb (151.5 kg)  11/29/19 (!) 307 lb 12.8 oz (139.6 kg)     Health Maintenance Due  Topic Date Due  . PAP SMEAR-Modifier  06/17/2015  . COLONOSCOPY  Never done  . FOOT EXAM  02/25/2018  . OPHTHALMOLOGY EXAM  07/13/2018    There are no preventive care reminders to display for this patient.  Lab Results  Component Value Date   TSH 0.496 10/03/2019   Lab Results  Component Value Date   WBC 15.5 (H) 12/05/2019   HGB 12.5 12/05/2019   HCT 41.7 12/05/2019   MCV 73.8 (L) 12/05/2019   PLT 361 12/05/2019   Lab Results  Component Value Date   NA 138 12/05/2019   K 3.8 12/05/2019   CO2 26 12/05/2019   GLUCOSE 215 (H) 12/05/2019   BUN 5 (L) 12/05/2019   CREATININE 0.63 12/05/2019   BILITOT 0.6 10/03/2019   ALKPHOS 67 10/03/2019   AST 23 10/03/2019   ALT 22 10/03/2019  PROT  7.8 10/03/2019   ALBUMIN 4.6 10/03/2019   CALCIUM 9.2 12/05/2019   ANIONGAP 12 12/05/2019   GFR 143.59 03/16/2014   Lab Results  Component Value Date   CHOL 203 (H) 10/03/2019   Lab Results  Component Value Date   HDL 51 10/03/2019   Lab Results  Component Value Date   LDLCALC 137 (H) 10/03/2019   Lab Results  Component Value Date   TRIG 83 10/03/2019   Lab Results  Component Value Date   CHOLHDL 4.0 10/03/2019   Lab Results  Component Value Date   HGBA1C 9.4 (A) 01/01/2020      Assessment & Plan:   1. Hospital discharge follow-up  2. Tonsillitis Resolved.  3. Shortness of breath Stale. No signs or symptoms of respiratory distress noted or reported today.   4. Type 2 diabetes mellitus with other circulatory complication, unspecified whether long term insulin use ([place]) She will continue medication as prescribed, to decrease foods/beverages high in sugars and carbs and follow Heart Healthy or DASH diet. Increase physical activity to at least 30 minutes cardio exercise daily.  - TSH - POCT glycosylated hemoglobin (Hb A1C) - POCT glucose (manual entry) - POCT urinalysis dipstick  5. Hemoglobin A1c greater than 9.0% Hgb A1c is decreased at 9.4 today, from 12.0 on 10/03/2019. Monitor.   6. Hyperglycemia  7. Encounter for diabetic foot exam ([place]) Negative. Foot exam tolerated well. No areas of decreased sensitivity noted upon foot exam. Patient counseled on proper foot hygiene. She is encouraged to exam feet often (daily), using mirror if necessary; keep feet clean and dry (especially between toes), keep feet moistened, wear cotton socks, and avoid wearing open-toed shoes, high-heel shoes, and sandals. Patient verbalized understanding.   8. Essential hypertension Blood pressures are elevated today. She will continue to take medications as prescribed, to decrease high sodium intake, excessive alcohol intake, increase potassium intake, smoking cessation, and increase  physical activity of at least 30 minutes of cardio activity daily. She will continue to follow Heart Healthy or DASH diet. - cloNIDine (CATAPRES) tablet 0.1 mg  9. Elevated blood pressure reading in office with diagnosis of hypertension She will take blood pressure medications as prescribed.   10. Class 3 severe obesity due to excess calories with serious comorbidity and body mass index (BMI) of 45.0 to 49.9 in adult [facility]) Body mass index is 47.07 kg/m.  Goal BMI  is <30. Encouraged efforts to reduce weight include engaging in physical activity as tolerated with goal of 150 minutes per week. Improve dietary choices and eat a meal regimen consistent with a Mediterranean or DASH diet. Reduce simple carbohydrates. Do not skip meals and eat healthy snacks throughout the day to avoid over-eating at dinner. Set a goal weight loss that is achievable for you.  11. Follow up She will follow up in 3 months.   Meds ordered this encounter  Medications  . cloNIDine (CATAPRES) tablet 0.1 mg    Orders Placed This Encounter  Procedures  . TSH  . POCT glycosylated hemoglobin (Hb A1C)  . POCT glucose (manual entry)  . POCT urinalysis dipstick    Referral Orders  No referral(s) requested today    Kathe Becton,  MSN, FNP-BC [facility] [address] [place], [place] [address] [phone] fax   Problem List Items Addressed This Visit      Cardiovascular and Mediastinum   HTN (hypertension)     Endocrine  DM (diabetes mellitus) ([place])   Relevant Orders   TSH   POCT glycosylated hemoglobin (Hb A1C) (Completed)   POCT glucose (manual entry) (Completed)   POCT urinalysis dipstick (Completed)     Other   Class 3 severe obesity due to excess calories with serious comorbidity and body mass index (BMI) of 45.0 to 49.9 in adult ([place])   Hemoglobin A1c greater than 9.0%   Hyperglycemia    Other Visit  Diagnoses    Hospital discharge follow-up    -  Primary   Tonsillitis       Shortness of breath       Encounter for diabetic foot exam ([place])       Elevated blood pressure reading in office with diagnosis of hypertension       Relevant Medications   cloNIDine (CATAPRES) tablet 0.1 mg (Completed)   Follow up          Meds ordered this encounter  Medications  . cloNIDine (CATAPRES) tablet 0.1 mg    Follow-up: Return in about 3 months (around 04/01/2020).    Azzie Glatter, FNP

## 2020-01-02 LAB — TSH: TSH: 0.711 u[IU]/mL (ref 0.450–4.500)

## 2020-04-02 ENCOUNTER — Ambulatory Visit: Payer: 59 | Admitting: Family Medicine

## 2020-05-08 ENCOUNTER — Ambulatory Visit: Payer: 59 | Admitting: Family Medicine

## 2020-06-06 ENCOUNTER — Other Ambulatory Visit: Payer: Self-pay | Admitting: Family Medicine

## 2020-06-06 DIAGNOSIS — I10 Essential (primary) hypertension: Secondary | ICD-10-CM

## 2020-06-10 ENCOUNTER — Other Ambulatory Visit: Payer: Self-pay

## 2020-06-10 ENCOUNTER — Ambulatory Visit (INDEPENDENT_AMBULATORY_CARE_PROVIDER_SITE_OTHER): Payer: 59 | Admitting: Family Medicine

## 2020-06-10 VITALS — BP 146/96 | HR 88 | Resp 20 | Ht 68.0 in

## 2020-06-10 DIAGNOSIS — R7309 Other abnormal glucose: Secondary | ICD-10-CM

## 2020-06-10 DIAGNOSIS — R0602 Shortness of breath: Secondary | ICD-10-CM

## 2020-06-10 DIAGNOSIS — E1159 Type 2 diabetes mellitus with other circulatory complications: Secondary | ICD-10-CM | POA: Diagnosis not present

## 2020-06-10 DIAGNOSIS — Z6841 Body Mass Index (BMI) 40.0 and over, adult: Secondary | ICD-10-CM

## 2020-06-10 DIAGNOSIS — R0600 Dyspnea, unspecified: Secondary | ICD-10-CM

## 2020-06-10 DIAGNOSIS — I10 Essential (primary) hypertension: Secondary | ICD-10-CM | POA: Diagnosis not present

## 2020-06-10 DIAGNOSIS — R0609 Other forms of dyspnea: Secondary | ICD-10-CM

## 2020-06-10 DIAGNOSIS — D649 Anemia, unspecified: Secondary | ICD-10-CM

## 2020-06-10 DIAGNOSIS — R739 Hyperglycemia, unspecified: Secondary | ICD-10-CM

## 2020-06-10 DIAGNOSIS — E66813 Obesity, class 3: Secondary | ICD-10-CM

## 2020-06-10 DIAGNOSIS — Z09 Encounter for follow-up examination after completed treatment for conditions other than malignant neoplasm: Secondary | ICD-10-CM

## 2020-06-10 LAB — POCT URINALYSIS DIPSTICK
Bilirubin, UA: NEGATIVE
Blood, UA: NEGATIVE
Glucose, UA: POSITIVE — AB
Ketones, UA: NEGATIVE
Leukocytes, UA: NEGATIVE
Nitrite, UA: NEGATIVE
Protein, UA: NEGATIVE
Spec Grav, UA: 1.02 (ref 1.010–1.025)
Urobilinogen, UA: 0.2 E.U./dL
pH, UA: 5 (ref 5.0–8.0)

## 2020-06-10 NOTE — Progress Notes (Signed)
Patient [facility]    Established Patient Office Visit  Subjective:  Patient ID: Claudia [name], female    DOB: 05-20-1966  Age: 55 y.o. MRN: [id number]  CC: No chief complaint on file.   HPI Claudia [name] is a 54 year old female who presents for Follow Up today.    Patient Active Problem List   Diagnosis Date Noted  . Hemoglobin A1c greater than 9.0% 10/04/2019  . Weight loss 10/04/2019  . Hx of non-ST elevation myocardial infarction (NSTEMI) 06/16/2019  . Class 3 severe obesity due to excess calories with serious comorbidity and body mass index (BMI) of 45.0 to 49.9 in adult ([place]) 06/16/2019  . Hyperglycemia 06/16/2019  . Hypertensive heart disease with systolic congestive heart failure ([place])   . NSTEMI (non-ST elevated myocardial infarction) ([place]) 04/24/2018  . ACS (acute coronary syndrome) ([place]) 04/24/2018  . Chronic pain of left knee 07/15/2017  . Chronic pain of right knee 07/15/2017  . Unilateral primary osteoarthritis, left knee 07/15/2017  . Unilateral primary osteoarthritis, right knee 07/15/2017  . Chest pain   . Benign essential HTN   . Cardiomyopathy ([place]) 03/16/2014  . Radiculopathy 09/07/2013  . Hypokalemia 06/02/2012  . HTN (hypertension) 06/01/2012  . DM (diabetes mellitus) ([place]) 06/01/2012  . Hyperlipidemia 06/01/2012  . Dyspnea on exertion 06/01/2012  . Morbid obesity ([place]) 06/01/2012   Current Status: Since her last office visit, she is doing well with no complaints. Her most recent normal range of preprandial blood glucose levels have been between 160-179. She has seen low range of 95 and high of 270 since his last office visit. She denies fatigue, frequent urination, blurred vision, excessive hunger, excessive thirst, weight gain, weight loss, and poor wound healing. She continues to check her feet regularly. She denies visual changes, chest pain, cough, shortness of breath, heart palpitations, and falls. She has  had several falls since her last visit which she r/t her hypertensive medications. She has occasional headaches and dizziness with position changes. Denies severe headaches, confusion, seizures, double vision, and blurred vision, nausea and vomiting. She denies fevers, chills, recent infections, weight loss, and night sweats. Denies GI problems such as diarrhea, and constipation. She has no reports of blood in stools, dysuria and hematuria. Her anxiety is moderate today r/t family and personal stressors. She is taking all medications as prescribed.   Past Medical History:  Diagnosis Date  . Diabetes mellitus   . Fibroids   . Headache(784.0)   . History of echocardiogram    Echo 7/18: Moderate concentric LVH, EF 55-60, normal wall motion, grade 1 diastolic dysfunction, calcified aortic leaflets  . Hypercholesterolemia   . Hyperlipidemia   . Hypertension   . Osteoarthritis    , with mild meniscus tear, followed by orthopedics--Dr Gladstone Lighter 04/29/10  . Right knee pain   . Seizures ([place])    as child  . Vitamin D deficiency     Past Surgical History:  Procedure Laterality Date  . ANTERIOR CERVICAL DECOMP/DISCECTOMY FUSION N/A 09/07/2013   Procedure: ANTERIOR CERVICAL DECOMPRESSION/DISCECTOMY FUSION 1 LEVEL;  Surgeon: Sinclair Ship, MD;  Location: [facility];  Service: Orthopedics;  Laterality: N/A;  Anterior cervical decompression fusion, cervical 5-6 with instrumentation and allograft  . LAPAROSCOPIC ASSISTED VAGINAL HYSTERECTOMY  1/14  . LEFT HEART CATH AND CORONARY ANGIOGRAPHY N/A 04/25/2018   Procedure: LEFT HEART CATH AND CORONARY ANGIOGRAPHY;  Surgeon: Lorretta Harp, MD;  Location: [facility];  Service: Cardiovascular;  Laterality: N/A;  . SHOULDER SURGERY Right 11   rotator cuff  . TUBAL LIGATION    . UMBILICAL HERNIA REPAIR      Family History  Problem Relation Age of Onset  . Hypertension Mother   . Diabetes Mother   . Hypercholesterolemia Mother   . Hypertension  Sister   . CVA Maternal Grandmother   . Hypertension Maternal Grandmother   . Diabetes Maternal Grandmother   . Heart attack Neg Hx     Social History   Socioeconomic History  . Marital status: Married    Spouse name: Not on file  . Number of children: Not on file  . Years of education: Not on file  . Highest education level: Not on file  Occupational History  . Not on file  Tobacco Use  . Smoking status: Never Smoker  . Smokeless tobacco: Never Used  Vaping Use  . Vaping Use: Never used  Substance and Sexual Activity  . Alcohol use: No  . Drug use: No  . Sexual activity: Yes  Other Topics Concern  . Not on file  Social History Narrative  . Not on file   Social Determinants of Health   Financial Resource Strain:   . Difficulty of Paying Living Expenses: Not on file  Food Insecurity:   . Worried About Charity fundraiser in the Last Year: Not on file  . Ran Out of Food in the Last Year: Not on file  Transportation Needs:   . Lack of Transportation (Medical): Not on file  . Lack of Transportation (Non-Medical): Not on file  Physical Activity:   . Days of Exercise per Week: Not on file  . Minutes of Exercise per Session: Not on file  Stress:   . Feeling of Stress : Not on file  Social Connections:   . Frequency of Communication with Friends and Family: Not on file  . Frequency of Social Gatherings with Friends and Family: Not on file  . Attends Religious Services: Not on file  . Active Member of Clubs or Organizations: Not on file  . Attends Archivist Meetings: Not on file  . Marital Status: Not on file  Intimate Partner Violence:   . Fear of Current or Ex-Partner: Not on file  . Emotionally Abused: Not on file  . Physically Abused: Not on file  . Sexually Abused: Not on file    Outpatient Medications Prior to Visit  Medication Sig Dispense Refill  . aspirin 81 MG EC tablet Take 1 tablet (81 mg total) by mouth daily. 30 tablet 6  . blood glucose  meter kit and supplies KIT Dispense based on patient and insurance preference. Use up to four times daily as directed. (FOR ICD-9 250.00, 250.01). 1 each 0  . vitamin B-12 (CYANOCOBALAMIN) 100 MCG tablet Take 100 mcg by mouth daily.    . Vitamin D, Ergocalciferol, (DRISDOL) 1.25 MG (50000 UT) CAPS capsule Take 1 capsule (50,000 Units total) by mouth every 7 (seven) days. 5 capsule 6  . amLODipine (NORVASC) 5 MG tablet Take 1 tablet (5 mg total) by mouth daily. 30 tablet 6  . ferrous sulfate 325 (65 FE) MG tablet Take 1 tablet (325 mg total) by mouth 2 (two) times daily. 60 tablet 6  . glipiZIDE (GLUCOTROL) 10 MG tablet Take 1 tablet (10 mg total) by mouth 2 (two) times daily before a meal. 60 tablet 6  . hydrALAZINE (APRESOLINE) 25 MG tablet Take 1 tablet (25 mg total) by  mouth every 8 (eight) hours. 90 tablet 6  . hydrochlorothiazide (HYDRODIURIL) 25 MG tablet Take 1 tablet (25 mg total) by mouth daily. 30 tablet 6  . losartan (COZAAR) 100 MG tablet Take 1 tablet (100 mg total) by mouth daily. 30 tablet 6  . metFORMIN (GLUCOPHAGE) 1000 MG tablet Take 1 tablet (1,000 mg total) by mouth 2 (two) times daily with a meal. 60 tablet 6  . metoprolol succinate (TOPROL-XL) 100 MG 24 hr tablet Take 1 tablet (100 mg total) by mouth daily. Take with or immediately following a meal. 30 tablet 6  . spironolactone (ALDACTONE) 25 MG tablet Take 1 tablet (25 mg total) by mouth daily. 30 tablet 6  . nitroGLYCERIN (NITROSTAT) 0.4 MG SL tablet Place 1 tablet (0.4 mg total) under the tongue every 5 (five) minutes x 3 doses as needed for chest pain. (Patient not taking: Reported on 01/01/2020) 100 tablet 6  . Omega-3 Fatty Acids (FISH OIL) 1000 MG CAPS Take by mouth. (Patient not taking: Reported on 06/10/2020)    . albuterol (VENTOLIN HFA) 108 (90 Base) MCG/ACT inhaler Inhale 2 puffs into the lungs every 4 (four) hours as needed for wheezing or shortness of breath (cough, shortness of breath or wheezing.). (Patient not  taking: Reported on 06/10/2020) 8 g 6   No facility-administered medications prior to visit.    Allergies  Allergen Reactions  . Crestor [Rosuvastatin]     MUSCLE ACHES HEADACHES   . Hydrocodone Itching  . Other Hives, Swelling and Other (See Comments)    Vision changes; ALLERGIC TO ALL NUTS EXCEPT PEANUTS  . Oxycodone Itching  . Pravachol [Pravastatin Sodium] Itching    Joint pain  . Simvastatin Hives    ROS Review of Systems  Constitutional: Negative.   HENT: Negative.   Eyes: Negative.   Respiratory: Negative.   Cardiovascular: Negative.   Gastrointestinal: Negative.   Endocrine: Negative.   Genitourinary: Negative.   Musculoskeletal: Positive for arthralgias (generalized joint pain).  Skin: Negative.   Allergic/Immunologic: Negative.   Neurological: Positive for dizziness (occasional ) and headaches (occasional ).  Hematological: Negative.   Psychiatric/Behavioral: Negative.       Objective:    Physical Exam Vitals and nursing note reviewed.  Constitutional:      Appearance: Normal appearance. She is obese.  HENT:     Head: Normocephalic and atraumatic.     Nose: Nose normal.     Mouth/Throat:     Mouth: Mucous membranes are moist.     Pharynx: Oropharynx is clear.  Cardiovascular:     Rate and Rhythm: Normal rate and regular rhythm.     Pulses: Normal pulses.     Heart sounds: Normal heart sounds.  Pulmonary:     Effort: Pulmonary effort is normal.     Breath sounds: Normal breath sounds.  Abdominal:     General: Bowel sounds are normal. There is distension (obese).     Palpations: Abdomen is soft.  Musculoskeletal:        General: Normal range of motion.     Cervical back: Normal range of motion and neck supple.  Skin:    General: Skin is warm and dry.  Neurological:     General: No focal deficit present.     Mental Status: She is alert and oriented to person, place, and time.  Psychiatric:        Mood and Affect: Mood normal.         Behavior: Behavior normal.  Thought Content: Thought content normal.        Judgment: Judgment normal.     BP (!) 146/96   Pulse 88   Resp 20   Ht 5\' 8"  (1.727 m)   LMP 05/09/2012   SpO2 94%   BMI 47.07 kg/m  Wt Readings from Last 3 Encounters:  01/01/20 (!) 309 lb 9.6 oz (140.4 kg)  12/05/19 (!) 334 lb (151.5 kg)  11/29/19 (!) 307 lb 12.8 oz (139.6 kg)     Health Maintenance Due  Topic Date Due  . Hepatitis C Screening  Never done  . COVID-19 Vaccine (1) Never done  . PAP SMEAR-Modifier  06/17/2015  . COLONOSCOPY  Never done  . FOOT EXAM  02/25/2018  . OPHTHALMOLOGY EXAM  07/13/2018  . INFLUENZA VACCINE  Never done    There are no preventive care reminders to display for this patient.  Lab Results  Component Value Date   TSH 0.711 01/01/2020   Lab Results  Component Value Date   WBC 15.5 (H) 12/05/2019   HGB 12.5 12/05/2019   HCT 41.7 12/05/2019   MCV 73.8 (L) 12/05/2019   PLT 361 12/05/2019   Lab Results  Component Value Date   NA 138 12/05/2019   K 3.8 12/05/2019   CO2 26 12/05/2019   GLUCOSE 215 (H) 12/05/2019   BUN 5 (L) 12/05/2019   CREATININE 0.63 12/05/2019   BILITOT 0.6 10/03/2019   ALKPHOS 67 10/03/2019   AST 23 10/03/2019   ALT 22 10/03/2019   PROT 7.8 10/03/2019   ALBUMIN 4.6 10/03/2019   CALCIUM 9.2 12/05/2019   ANIONGAP 12 12/05/2019   GFR 143.59 03/16/2014   Lab Results  Component Value Date   CHOL 203 (H) 10/03/2019   Lab Results  Component Value Date   HDL 51 10/03/2019   Lab Results  Component Value Date   LDLCALC 137 (H) 10/03/2019   Lab Results  Component Value Date   TRIG 83 10/03/2019   Lab Results  Component Value Date   CHOLHDL 4.0 10/03/2019   Lab Results  Component Value Date   HGBA1C 13.6 (H) 06/10/2020    Assessment & Plan:   1. Type 2 diabetes mellitus with other circulatory complication, unspecified whether long term insulin use (HCC) She will continue medication as prescribed, to decrease  foods/beverages high in sugars and carbs and follow Heart Healthy or DASH diet. Increase physical activity to at least 30 minutes cardio exercise daily.  - Urinalysis Dipstick - Glucose (CBG), Fasting - HgB A1c - Glucose, fasting - Hemoglobin A1c - glipiZIDE (GLUCOTROL) 10 MG tablet; Take 1 tablet (10 mg total) by mouth 2 (two) times daily before a meal.  Dispense: 60 tablet; Refill: 6 - metFORMIN (GLUCOPHAGE) 1000 MG tablet; Take 1 tablet (1,000 mg total) by mouth 2 (two) times daily with a meal.  Dispense: 180 tablet; Refill: 3  2. Hemoglobin A1c greater than 9.0% Hgb A1c is elevated at 13.6 today, from 9.4 on 01/01/2020.  - Urinalysis Dipstick - Glucose (CBG), Fasting - HgB A1c - Glucose, fasting - Hemoglobin A1c  3. Hyperglycemia  4. Essential hypertension The current medical regimen is effective; blood presssure is stable today; continue present plan and medications as prescribed. She will continue to take medications as prescribed, to decrease high sodium intake, excessive alcohol intake, increase potassium intake, smoking cessation, and increase physical activity of at least 30 minutes of cardio activity daily. She will continue to follow Heart Healthy or DASH diet.  -  amLODipine (NORVASC) 5 MG tablet; Take 1 tablet (5 mg total) by mouth daily.  Dispense: 30 tablet; Refill: 6 - hydrALAZINE (APRESOLINE) 25 MG tablet; Take 1 tablet (25 mg total) by mouth every 8 (eight) hours.  Dispense: 270 tablet; Refill: 3 - hydrochlorothiazide (HYDRODIURIL) 25 MG tablet; Take 1 tablet (25 mg total) by mouth daily.  Dispense: 90 tablet; Refill: 3 - losartan (COZAAR) 100 MG tablet; Take 1 tablet (100 mg total) by mouth daily.  Dispense: 90 tablet; Refill: 3 - metoprolol succinate (TOPROL-XL) 100 MG 24 hr tablet; Take 1 tablet (100 mg total) by mouth daily. Take with or immediately following a meal.  Dispense: 90 tablet; Refill: 3 - spironolactone (ALDACTONE) 25 MG tablet; Take 1 tablet (25 mg total) by  mouth daily.  Dispense: 90 tablet; Refill: 3  5. Class 3 severe obesity due to excess calories with serious comorbidity and body mass index (BMI) of 45.0 to 49.9 in adult [facility]) Body mass index is 47.07 kg/m. Goal BMI  is <30. Encouraged efforts to reduce weight include engaging in physical activity as tolerated with goal of 150 minutes per week. Improve dietary choices and eat a meal regimen consistent with a Mediterranean or DASH diet. Reduce simple carbohydrates. Do not skip meals and eat healthy snacks throughout the day to avoid over-eating at dinner. Set a goal weight loss that is achievable for you.  6. Anemia, unspecified type - ferrous sulfate 325 (65 FE) MG tablet; Take 1 tablet (325 mg total) by mouth 2 (two) times daily.  Dispense: 60 tablet; Refill: 6  7. Shortness of breath Stable today.  - albuterol (VENTOLIN HFA) 108 (90 Base) MCG/ACT inhaler; Inhale 2 puffs into the lungs every 4 (four) hours as needed for wheezing or shortness of breath (cough, shortness of breath or wheezing.).  Dispense: 8 g; Refill: 11  8. Dyspnea on exertion - albuterol (VENTOLIN HFA) 108 (90 Base) MCG/ACT inhaler; Inhale 2 puffs into the lungs every 4 (four) hours as needed for wheezing or shortness of breath (cough, shortness of breath or wheezing.).  Dispense: 8 g; Refill: 11  9. Follow up She will follow up in 4 months.   Meds ordered this encounter  Medications  . amLODipine (NORVASC) 5 MG tablet    Sig: Take 1 tablet (5 mg total) by mouth daily.    Dispense:  30 tablet    Refill:  6  . ferrous sulfate 325 (65 FE) MG tablet    Sig: Take 1 tablet (325 mg total) by mouth 2 (two) times daily.    Dispense:  60 tablet    Refill:  6  . glipiZIDE (GLUCOTROL) 10 MG tablet    Sig: Take 1 tablet (10 mg total) by mouth 2 (two) times daily before a meal.    Dispense:  60 tablet    Refill:  6  . hydrALAZINE (APRESOLINE) 25 MG tablet    Sig: Take 1 tablet (25 mg total) by mouth every 8 (eight) hours.     Dispense:  270 tablet    Refill:  3  . hydrochlorothiazide (HYDRODIURIL) 25 MG tablet    Sig: Take 1 tablet (25 mg total) by mouth daily.    Dispense:  90 tablet    Refill:  3  . losartan (COZAAR) 100 MG tablet    Sig: Take 1 tablet (100 mg total) by mouth daily.    Dispense:  90 tablet    Refill:  3  . metFORMIN (GLUCOPHAGE) 1000 MG tablet  Sig: Take 1 tablet (1,000 mg total) by mouth 2 (two) times daily with a meal.    Dispense:  180 tablet    Refill:  3  . metoprolol succinate (TOPROL-XL) 100 MG 24 hr tablet    Sig: Take 1 tablet (100 mg total) by mouth daily. Take with or immediately following a meal.    Dispense:  90 tablet    Refill:  3  . spironolactone (ALDACTONE) 25 MG tablet    Sig: Take 1 tablet (25 mg total) by mouth daily.    Dispense:  90 tablet    Refill:  3  . albuterol (VENTOLIN HFA) 108 (90 Base) MCG/ACT inhaler    Sig: Inhale 2 puffs into the lungs every 4 (four) hours as needed for wheezing or shortness of breath (cough, shortness of breath or wheezing.).    Dispense:  8 g    Refill:  11    Orders Placed This Encounter  Procedures  . Glucose, fasting  . Hemoglobin A1c  . Urinalysis Dipstick  . Glucose (CBG), Fasting  . HgB A1c    Referral Orders  No referral(s) requested today    Kathe Becton,  MSN, FNP-BC [facility] [address], [place] [address] [phone]- fax   Problem List Items Addressed This Visit      Cardiovascular and Mediastinum   HTN (hypertension)   Relevant Medications   amLODipine (NORVASC) 5 MG tablet   hydrALAZINE (APRESOLINE) 25 MG tablet   hydrochlorothiazide (HYDRODIURIL) 25 MG tablet   losartan (COZAAR) 100 MG tablet   metoprolol succinate (TOPROL-XL) 100 MG 24 hr tablet   spironolactone (ALDACTONE) 25 MG tablet     Endocrine   DM (diabetes mellitus) ([place]) - Primary   Relevant Medications    glipiZIDE (GLUCOTROL) 10 MG tablet   losartan (COZAAR) 100 MG tablet   metFORMIN (GLUCOPHAGE) 1000 MG tablet   Other Relevant Orders   Urinalysis Dipstick (Completed)   Glucose (CBG), Fasting   HgB A1c   Glucose, fasting (Completed)   Hemoglobin A1c (Completed)     Other   Class 3 severe obesity due to excess calories with serious comorbidity and body mass index (BMI) of 45.0 to 49.9 in adult [facility])   Relevant Medications   glipiZIDE (GLUCOTROL) 10 MG tablet   metFORMIN (GLUCOPHAGE) 1000 MG tablet   Dyspnea on exertion   Relevant Medications   albuterol (VENTOLIN HFA) 108 (90 Base) MCG/ACT inhaler   Hemoglobin A1c greater than 9.0%   Relevant Orders   Urinalysis Dipstick (Completed)   Glucose (CBG), Fasting   HgB A1c   Glucose, fasting (Completed)   Hemoglobin A1c (Completed)   Hyperglycemia    Other Visit Diagnoses    Anemia, unspecified type       Relevant Medications   ferrous sulfate 325 (65 FE) MG tablet   Shortness of breath       Relevant Medications   albuterol (VENTOLIN HFA) 108 (90 Base) MCG/ACT inhaler   Follow up          Meds ordered this encounter  Medications  . amLODipine (NORVASC) 5 MG tablet    Sig: Take 1 tablet (5 mg total) by mouth daily.    Dispense:  30 tablet    Refill:  6  . ferrous sulfate 325 (65 FE) MG tablet    Sig: Take 1 tablet (325 mg total) by mouth 2 (two) times daily.  Dispense:  60 tablet    Refill:  6  . glipiZIDE (GLUCOTROL) 10 MG tablet    Sig: Take 1 tablet (10 mg total) by mouth 2 (two) times daily before a meal.    Dispense:  60 tablet    Refill:  6  . hydrALAZINE (APRESOLINE) 25 MG tablet    Sig: Take 1 tablet (25 mg total) by mouth every 8 (eight) hours.    Dispense:  270 tablet    Refill:  3  . hydrochlorothiazide (HYDRODIURIL) 25 MG tablet    Sig: Take 1 tablet (25 mg total) by mouth daily.    Dispense:  90 tablet    Refill:  3  . losartan (COZAAR) 100 MG tablet    Sig: Take 1 tablet (100 mg total) by mouth  daily.    Dispense:  90 tablet    Refill:  3  . metFORMIN (GLUCOPHAGE) 1000 MG tablet    Sig: Take 1 tablet (1,000 mg total) by mouth 2 (two) times daily with a meal.    Dispense:  180 tablet    Refill:  3  . metoprolol succinate (TOPROL-XL) 100 MG 24 hr tablet    Sig: Take 1 tablet (100 mg total) by mouth daily. Take with or immediately following a meal.    Dispense:  90 tablet    Refill:  3  . spironolactone (ALDACTONE) 25 MG tablet    Sig: Take 1 tablet (25 mg total) by mouth daily.    Dispense:  90 tablet    Refill:  3  . albuterol (VENTOLIN HFA) 108 (90 Base) MCG/ACT inhaler    Sig: Inhale 2 puffs into the lungs every 4 (four) hours as needed for wheezing or shortness of breath (cough, shortness of breath or wheezing.).    Dispense:  8 g    Refill:  11    Follow-up: Return in about 4 months (around 10/10/2020).    Azzie Glatter, FNP

## 2020-06-11 LAB — HEMOGLOBIN A1C
Est. average glucose Bld gHb Est-mCnc: 344 mg/dL
Hgb A1c MFr Bld: 13.6 % — ABNORMAL HIGH (ref 4.8–5.6)

## 2020-06-11 LAB — GLUCOSE, FASTING: Glucose, Plasma: 283 mg/dL — ABNORMAL HIGH (ref 65–99)

## 2020-06-12 ENCOUNTER — Encounter: Payer: Self-pay | Admitting: Family Medicine

## 2020-06-12 MED ORDER — ALBUTEROL SULFATE HFA 108 (90 BASE) MCG/ACT IN AERS: 2.0000 | INHALATION_SPRAY | RESPIRATORY_TRACT | 11 refills | Status: DC | PRN

## 2020-06-12 MED ORDER — HYDRALAZINE HCL 25 MG PO TABS: 25.0000 mg | ORAL_TABLET | Freq: Three times a day (TID) | ORAL | 3 refills | Status: DC

## 2020-06-12 MED ORDER — METOPROLOL SUCCINATE ER 100 MG PO TB24: 100.0000 mg | ORAL_TABLET | Freq: Every day | ORAL | 3 refills | Status: DC

## 2020-06-12 MED ORDER — LOSARTAN POTASSIUM 100 MG PO TABS: 100.0000 mg | ORAL_TABLET | Freq: Every day | ORAL | 3 refills | Status: DC

## 2020-06-12 MED ORDER — SPIRONOLACTONE 25 MG PO TABS: 25.0000 mg | ORAL_TABLET | Freq: Every day | ORAL | 3 refills | Status: DC

## 2020-06-12 MED ORDER — FERROUS SULFATE 325 (65 FE) MG PO TABS: 325.0000 mg | ORAL_TABLET | Freq: Two times a day (BID) | ORAL | 6 refills | Status: DC

## 2020-06-12 MED ORDER — AMLODIPINE BESYLATE 5 MG PO TABS: 5.0000 mg | ORAL_TABLET | Freq: Every day | ORAL | 6 refills | Status: DC

## 2020-06-12 MED ORDER — GLIPIZIDE 10 MG PO TABS: 10.0000 mg | ORAL_TABLET | Freq: Two times a day (BID) | ORAL | 6 refills | Status: DC

## 2020-06-12 MED ORDER — HYDROCHLOROTHIAZIDE 25 MG PO TABS: 25.0000 mg | ORAL_TABLET | Freq: Every day | ORAL | 3 refills | Status: DC

## 2020-06-12 MED ORDER — METFORMIN HCL 1000 MG PO TABS: 1000.0000 mg | ORAL_TABLET | Freq: Two times a day (BID) | ORAL | 3 refills | Status: DC

## 2020-06-17 NOTE — Progress Notes (Signed)
CARDIOLOGY OFFICE NOTE  Date:  06/26/2020    Claudia [name] Date of Birth: 01/04/1966 Medical Record #[id number]  PCP:  Azzie Glatter, FNP  Cardiologist:  [facility]  Chief Complaint  Patient presents with  . Follow-up    Seen for Dr. Marlou Porch    History of Present Illness: Claudia [name] is a 54 y.o. female who presents today for a 6 month check. Seen for Dr. Marlou Porch.   She has a history of known chronic systolic heart failure, dilated cardiomyopathy with EF 40 to 45% that subsequently resolved to normal with an EF 60%.  Prior cardiac catheterization showed normal coronary arteries. Other issues include HTN, morbid obesity and uncontrolled DM.   Last seen in March - continues to struggle with weight/diabetes and panic attacks.   Comes in today. Here alone. She says she is ok. Gave up with trying a "diet". Trying to just eat less - she admits she loves sugar. Her weight is down. Her A1C was pretty high. She had been having some dizzy spells - she thinks due to Toprol - one spell she fell and felt like she was passing out. She noted that everything was "spinning". She had an actual fall - stepped in a sunken down place and twisted her ankle. She has since changed her Metoprolol to bedtime and is now better. No chest pain. Breathing is ok. No real swelling. Her weight is going back down. Lots of stress - husband out on workman's comp.   Past Medical History:  Diagnosis Date  . Diabetes mellitus   . Fibroids   . Headache(784.0)   . History of echocardiogram    Echo 7/18: Moderate concentric LVH, EF 55-60, normal wall motion, grade 1 diastolic dysfunction, calcified aortic leaflets  . Hypercholesterolemia   . Hyperlipidemia   . Hypertension   . Osteoarthritis    , with mild meniscus tear, followed by orthopedics--Dr Gladstone Lighter 04/29/10  . Right knee pain   . Seizures ([place])    as child  . Vitamin D deficiency     Past Surgical History:  Procedure Laterality Date  . ANTERIOR  CERVICAL DECOMP/DISCECTOMY FUSION N/A 09/07/2013   Procedure: ANTERIOR CERVICAL DECOMPRESSION/DISCECTOMY FUSION 1 LEVEL;  Surgeon: Sinclair Ship, MD;  Location: [facility];  Service: Orthopedics;  Laterality: N/A;  Anterior cervical decompression fusion, cervical 5-6 with instrumentation and allograft  . LAPAROSCOPIC ASSISTED VAGINAL HYSTERECTOMY  1/14  . LEFT HEART CATH AND CORONARY ANGIOGRAPHY N/A 04/25/2018   Procedure: LEFT HEART CATH AND CORONARY ANGIOGRAPHY;  Surgeon: Lorretta Harp, MD;  Location: [facility];  Service: Cardiovascular;  Laterality: N/A;  . SHOULDER SURGERY Right 11   rotator cuff  . TUBAL LIGATION    . UMBILICAL HERNIA REPAIR       Medications: Current Meds  Medication Sig  . albuterol (VENTOLIN HFA) 108 (90 Base) MCG/ACT inhaler Inhale 2 puffs into the lungs every 4 (four) hours as needed for wheezing or shortness of breath (cough, shortness of breath or wheezing.).  Marland Kitchen amLODipine (NORVASC) 5 MG tablet Take 1 tablet (5 mg total) by mouth daily.  Marland Kitchen aspirin 81 MG EC tablet Take 1 tablet (81 mg total) by mouth daily.  . blood glucose meter kit and supplies KIT Dispense based on patient and insurance preference. Use up to four times daily as directed. (FOR ICD-9 250.00, 250.01).  . ferrous sulfate 325 (65 FE) MG tablet Take 1 tablet (325 mg total) by mouth 2 (two) times daily.  Marland Kitchen  glipiZIDE (GLUCOTROL) 10 MG tablet Take 1 tablet (10 mg total) by mouth 2 (two) times daily before a meal.  . hydrALAZINE (APRESOLINE) 25 MG tablet Take 1 tablet (25 mg total) by mouth every 8 (eight) hours.  . hydrochlorothiazide (HYDRODIURIL) 25 MG tablet Take 1 tablet (25 mg total) by mouth daily.  Marland Kitchen losartan (COZAAR) 100 MG tablet Take 1 tablet (100 mg total) by mouth daily.  . metFORMIN (GLUCOPHAGE) 1000 MG tablet Take 1 tablet (1,000 mg total) by mouth 2 (two) times daily with a meal.  . metoprolol succinate (TOPROL-XL) 100 MG 24 hr tablet Take 1 tablet (100 mg total) by mouth  daily. Take with or immediately following a meal.  . nitroGLYCERIN (NITROSTAT) 0.4 MG SL tablet Place 1 tablet (0.4 mg total) under the tongue every 5 (five) minutes x 3 doses as needed for chest pain.  . Omega-3 Fatty Acids (FISH OIL) 1000 MG CAPS Take by mouth.   . spironolactone (ALDACTONE) 25 MG tablet Take 1 tablet (25 mg total) by mouth daily.  . vitamin B-12 (CYANOCOBALAMIN) 100 MCG tablet Take 100 mcg by mouth daily.  . Vitamin D, Ergocalciferol, (DRISDOL) 1.25 MG (50000 UT) CAPS capsule Take 1 capsule (50,000 Units total) by mouth every 7 (seven) days.     Allergies: Allergies  Allergen Reactions  . Crestor [Rosuvastatin]     MUSCLE ACHES HEADACHES   . Hydrocodone Itching  . Other Hives, Swelling and Other (See Comments)    Vision changes; ALLERGIC TO ALL NUTS EXCEPT PEANUTS  . Oxycodone Itching  . Pravachol [Pravastatin Sodium] Itching    Joint pain  . Simvastatin Hives    Social History: The patient  reports that she has never smoked. She has never used smokeless tobacco. She reports that she does not drink alcohol and does not use drugs.   Family History: The patient's family history includes CVA in her maternal grandmother; Diabetes in her maternal grandmother and mother; Hypercholesterolemia in her mother; Hypertension in her maternal grandmother, mother, and sister.   Review of Systems: Please see the history of present illness.   All other systems are reviewed and negative.   Physical Exam: VS:  BP 120/90   Pulse 82   Ht _0  (1.727 m)   Wt (!) 301 lb 12.8 oz (136.9 kg)   LMP 05/09/2012   SpO2 94%   BMI 45.89 kg/m  .  BMI Body mass index is 45.89 kg/m.  Wt Readings from Last 3 Encounters:  06/26/20 (!) 301 lb 12.8 oz (136.9 kg)  01/01/20 (!) 309 lb 9.6 oz (140.4 kg)  12/05/19 (!) 334 lb (151.5 kg)    General: Pleasant. Alert and in no acute distress. She is obese.  Her weight is coming back down.  Cardiac: Regular rate and rhythm. Heart tones are  distant. No edema.  Respiratory:  Lungs are clear to auscultation bilaterally with normal work of breathing.  GI: Soft and nontender.  MS: No deformity or atrophy. Gait and ROM intact.  Skin: Warm and dry. Color is normal.  Neuro:  Strength and sensation are intact and no gross focal deficits noted.  Psych: Alert, appropriate and with normal affect.   LABORATORY DATA:  EKG:  EKG is not ordered today.    Lab Results  Component Value Date   WBC 15.5 (H) 12/05/2019   HGB 12.5 12/05/2019   HCT 41.7 12/05/2019   PLT 361 12/05/2019   GLUCOSE 215 (H) 12/05/2019   CHOL 203 (H) 10/03/2019  TRIG 83 10/03/2019   HDL 51 10/03/2019   LDLCALC 137 (H) 10/03/2019   ALT 22 10/03/2019   AST 23 10/03/2019   NA 138 12/05/2019   K 3.8 12/05/2019   CL 100 12/05/2019   CREATININE 0.63 12/05/2019   BUN 5 (L) 12/05/2019   CO2 26 12/05/2019   TSH 0.711 01/01/2020   INR 0.97 04/25/2018   HGBA1C 13.6 (H) 06/10/2020   MICROALBUR 3.7 03/01/2017     BNP (last 3 results) No results for input(s): BNP in the last 8760 hours.  ProBNP (last 3 results) No results for input(s): PROBNP in the last 8760 hours.   Other Studies Reviewed Today:  CARDIAC CATH 03/2018 IMPRESSION: Ms. Stetzer has normal coronary arteries and normal filling pressures.  I believe her chest pain is noncardiac.  2D echo is pending for LV function.  Medical therapy for noncardiac chest pain will be recommended.  The sheath was removed and a TR band was placed on the right wrist to achieve patent hemostasis.  The patient left the lab in stable condition.  Quay Burow. MD, [facility] 04/25/2018 4:40 PM   Echo Study Conclusions 03/2018  - Left ventricle: The cavity size was normal. There was mild  concentric hypertrophy. Systolic function was normal. The  estimated ejection fraction was in the range of 55% to 60%. Wall  motion was normal; there were no regional wall motion  abnormalities. Left ventricular diastolic  function parameters  were normal.  - Aortic valve: Valve area (VTI): 2.33 cm^2. Valve area (Vmax):  2.23 cm^2. Valve area (Vmean): 2.03 cm^2.  - Left atrium: The atrium was mildly dilated.  - Pulmonic valve: There was trivial regurgitation.  - Pulmonary arteries: Systolic pressure could not be accurately  estimated.   ASSESSMENT & PLAN:    1. HTN - BP just fair - seems to have some dizziness that sounds positional as well as vertigo - she changed her Metoprolol to night time - this is better - she will keep [place] posted. If this persists she will let us know.   2. NICM - chronic systolic HF - EF has recovered. She notes her breathing is ok.   3. Morbid obesity - weight is going back down. Encouragement given.   4. HLD - statin intolerant - no CAD - encourage her to keep working on weight.   5. DM - A1C quite high  Current medicines are reviewed with the patient today.  The patient does not have concerns regarding medicines other than what has been noted above.  The following changes have been made:  See above.  Labs/ tests ordered today include:   No orders of the defined types were placed in this encounter.    Disposition:   FU with Dr. Marlou Porch - her labs are checked by PCP. Overall prognosis tenuous - tried to be encouraging.    Patient is agreeable to this plan and will call if any problems develop in the interim.   SignedTruitt Merle, NP  06/26/2020 3:21 PM  [place] [address] [place], [place]  [address] Phone: [phone] Fax: [phone]

## 2020-06-26 ENCOUNTER — Other Ambulatory Visit: Payer: Self-pay

## 2020-06-26 ENCOUNTER — Encounter: Payer: Self-pay | Admitting: Nurse Practitioner

## 2020-06-26 ENCOUNTER — Ambulatory Visit (INDEPENDENT_AMBULATORY_CARE_PROVIDER_SITE_OTHER): Payer: 59 | Admitting: Nurse Practitioner

## 2020-06-26 VITALS — BP 120/90 | HR 82 | Ht 68.0 in | Wt 301.8 lb

## 2020-06-26 DIAGNOSIS — E7800 Pure hypercholesterolemia, unspecified: Secondary | ICD-10-CM

## 2020-06-26 DIAGNOSIS — I428 Other cardiomyopathies: Secondary | ICD-10-CM

## 2020-06-26 DIAGNOSIS — I10 Essential (primary) hypertension: Secondary | ICD-10-CM

## 2020-06-26 NOTE — Patient Instructions (Addendum)
After Visit Summary:  We will be checking the following labs today - NONE   Medication Instructions:    Continue with your current medicines.   Ok to take the Metoprolol at night   If you need a refill on your cardiac medications before your next appointment, please call your pharmacy.     Testing/Procedures To Be Arranged:  N/A  Follow-Up:   See Dr. Marlou Porch in 6 months.     At [facility], you and your health needs are our priority.  As part of our continuing mission to provide you with exceptional heart care, we have created designated Provider Care Teams.  These Care Teams include your primary Cardiologist (physician) and Advanced Practice Providers (APPs -  Physician Assistants and Nurse Practitioners) who all work together to provide you with the care you need, when you need it.  Special Instructions:  . Stay safe, wash your hands for at least 20 seconds and wear a mask when needed.  . It was good to talk with you today.  Marland Kitchen Keep trying!!   Call the [place] office at [phone] if you have any questions, problems or concerns.

## 2020-07-07 ENCOUNTER — Other Ambulatory Visit: Payer: Self-pay | Admitting: Family Medicine

## 2020-07-07 DIAGNOSIS — I10 Essential (primary) hypertension: Secondary | ICD-10-CM

## 2020-07-12 ENCOUNTER — Telehealth: Payer: Self-pay | Admitting: Family Medicine

## 2020-07-15 NOTE — Telephone Encounter (Signed)
Done

## 2020-08-07 ENCOUNTER — Telehealth: Payer: Self-pay | Admitting: Family Medicine

## 2020-08-07 NOTE — Telephone Encounter (Signed)
Pt was called concerning AWV w/ Jenera. Lvm to call back

## 2020-10-04 ENCOUNTER — Other Ambulatory Visit: Payer: Self-pay

## 2020-10-04 ENCOUNTER — Encounter: Payer: Self-pay | Admitting: Family Medicine

## 2020-10-04 ENCOUNTER — Ambulatory Visit (INDEPENDENT_AMBULATORY_CARE_PROVIDER_SITE_OTHER): Payer: 59 | Admitting: Family Medicine

## 2020-10-04 VITALS — BP 138/79 | HR 102 | Temp 97.3°F | Ht 68.0 in | Wt 294.0 lb

## 2020-10-04 DIAGNOSIS — Z09 Encounter for follow-up examination after completed treatment for conditions other than malignant neoplasm: Secondary | ICD-10-CM

## 2020-10-04 DIAGNOSIS — E66813 Obesity, class 3: Secondary | ICD-10-CM

## 2020-10-04 DIAGNOSIS — Z6841 Body Mass Index (BMI) 40.0 and over, adult: Secondary | ICD-10-CM

## 2020-10-04 DIAGNOSIS — E1159 Type 2 diabetes mellitus with other circulatory complications: Secondary | ICD-10-CM | POA: Diagnosis not present

## 2020-10-04 DIAGNOSIS — R0609 Other forms of dyspnea: Secondary | ICD-10-CM

## 2020-10-04 DIAGNOSIS — I10 Essential (primary) hypertension: Secondary | ICD-10-CM

## 2020-10-04 DIAGNOSIS — R0600 Dyspnea, unspecified: Secondary | ICD-10-CM

## 2020-10-04 DIAGNOSIS — Z0000 Encounter for general adult medical examination without abnormal findings: Secondary | ICD-10-CM

## 2020-10-04 DIAGNOSIS — R634 Abnormal weight loss: Secondary | ICD-10-CM | POA: Diagnosis not present

## 2020-10-04 DIAGNOSIS — R739 Hyperglycemia, unspecified: Secondary | ICD-10-CM

## 2020-10-04 DIAGNOSIS — R7309 Other abnormal glucose: Secondary | ICD-10-CM

## 2020-10-04 DIAGNOSIS — E0800 Diabetes mellitus due to underlying condition with hyperosmolarity without nonketotic hyperglycemic-hyperosmolar coma (NKHHC): Secondary | ICD-10-CM

## 2020-10-04 LAB — POCT URINALYSIS DIPSTICK
Blood, UA: NEGATIVE
Glucose, UA: POSITIVE — AB
Ketones, UA: POSITIVE
Leukocytes, UA: NEGATIVE
Nitrite, UA: NEGATIVE
Protein, UA: POSITIVE — AB
Spec Grav, UA: 1.02 (ref 1.010–1.025)
Urobilinogen, UA: 0.2 E.U./dL
pH, UA: 5.5 (ref 5.0–8.0)

## 2020-10-04 LAB — GLUCOSE, POCT (MANUAL RESULT ENTRY): POC Glucose: 405 mg/dl — AB (ref 70–99)

## 2020-10-04 NOTE — Progress Notes (Signed)
Patient [facility]    Established Patient Office Visit  Subjective:  Patient ID: Claudia [name], female    DOB: 03-Feb-1966  Age: 55 y.o. MRN: [id number]  CC:  Chief Complaint  Patient presents with  . Diabetes    Having dry cough ,blood sugar reading is evlvated 400 to 300    HPI Claudia [name] is a 55 year old female who presents for Follow Up today.    Patient Active Problem List   Diagnosis Date Noted  . Hemoglobin A1c greater than 9.0% 10/04/2019  . Weight loss 10/04/2019  . Hx of non-ST elevation myocardial infarction (NSTEMI) 06/16/2019  . Class 3 severe obesity due to excess calories with serious comorbidity and body mass index (BMI) of 45.0 to 49.9 in adult ([place]) 06/16/2019  . Hyperglycemia 06/16/2019  . Hypertensive heart disease with systolic congestive heart failure ([place])   . NSTEMI (non-ST elevated myocardial infarction) ([place]) 04/24/2018  . ACS (acute coronary syndrome) ([place]) 04/24/2018  . Chronic pain of left knee 07/15/2017  . Chronic pain of right knee 07/15/2017  . Unilateral primary osteoarthritis, left knee 07/15/2017  . Unilateral primary osteoarthritis, right knee 07/15/2017  . Chest pain   . Benign essential HTN   . Cardiomyopathy ([place]) 03/16/2014  . Radiculopathy 09/07/2013  . Hypokalemia 06/02/2012  . HTN (hypertension) 06/01/2012  . DM (diabetes mellitus) ([place]) 06/01/2012  . Hyperlipidemia 06/01/2012  . Dyspnea on exertion 06/01/2012  . Morbid obesity ([place]) 06/01/2012   Current Status: Since her last office visit, she is doing well with no complaints. She states that she has been battling increased blood glucose level of > 600 over this past week, which she is aware of steps towards increased blood glucose levels. She reports that as soon as she saw that her levels were elevated she began to drink more water and avoid carbohydrates. Her most recent normal range of preprandial blood glucose levels have been  between 198-250. She has seen low range of 298 and high of 600 since his last office visit. She denies fatigue, frequent urination, blurred vision, excessive hunger, excessive thirst, weight gain, weight loss, and poor wound healing. She continues to check her feet regularly. She denies visual changes, chest pain, cough, shortness of breath, heart palpitations, and falls. She has occasional headaches and dizziness with position changes. Denies severe headaches, confusion, seizures, double vision, and blurred vision, nausea and vomiting.She denies fevers, chills, recent infections, weight loss, and night sweats. Denies GI problems such as diarrhea, and constipation. She has no reports of blood  in stools, dysuria and hematuria. No depression or anxiety reported today. She is taking all medications as prescribed. She denies pain today.   Past Medical History:  Diagnosis Date  . Diabetes mellitus   . Fibroids   . Headache(784.0)   . History of echocardiogram    Echo 7/18: Moderate concentric LVH, EF 55-60, normal wall motion, grade 1 diastolic dysfunction, calcified aortic leaflets  . Hypercholesterolemia   . Hyperlipidemia   . Hypertension   . Osteoarthritis    , with mild meniscus tear, followed by orthopedics--Dr Gladstone Lighter 04/29/10  . Right knee pain   . Seizures ([place])    as child  . Vitamin D deficiency     Past Surgical History:  Procedure Laterality Date  . ANTERIOR CERVICAL DECOMP/DISCECTOMY FUSION N/A 09/07/2013   Procedure: ANTERIOR CERVICAL DECOMPRESSION/DISCECTOMY FUSION 1 LEVEL;  Surgeon: Sinclair Ship, MD;  Location: [facility];  Service: Orthopedics;  Laterality: N/A;  Anterior cervical decompression fusion, cervical 5-6 with instrumentation and allograft  . LAPAROSCOPIC ASSISTED VAGINAL HYSTERECTOMY  1/14  . LEFT HEART CATH AND CORONARY ANGIOGRAPHY N/A 04/25/2018   Procedure: LEFT HEART CATH AND CORONARY ANGIOGRAPHY;  Surgeon: Lorretta Harp, MD;  Location: [facility];   Service: Cardiovascular;  Laterality: N/A;  . SHOULDER SURGERY Right 11   rotator cuff  . TUBAL LIGATION    . UMBILICAL HERNIA REPAIR      Family History  Problem Relation Age of Onset  . Hypertension Mother   . Diabetes Mother   . Hypercholesterolemia Mother   . Hypertension Sister   . CVA Maternal Grandmother   . Hypertension Maternal Grandmother   . Diabetes Maternal Grandmother   . Heart attack Neg Hx     Social History   Socioeconomic History  . Marital status: Married    Spouse name: Not on file  . Number of children: Not on file  . Years of education: Not on file  . Highest education level: Not on file  Occupational History  . Not on file  Tobacco Use  . Smoking status: Never Smoker  . Smokeless tobacco: Never Used  Vaping Use  . Vaping Use: Never used  Substance and Sexual Activity  . Alcohol use: No  . Drug use: No  . Sexual activity: Yes  Other Topics Concern  . Not on file  Social History Narrative  . Not on file   Social Determinants of Health   Financial Resource Strain: Not on file  Food Insecurity: Not on file  Transportation Needs: Not on file  Physical Activity: Not on file  Stress: Not on file  Social Connections: Not on file  Intimate Partner Violence: Not on file    Outpatient Medications Prior to Visit  Medication Sig Dispense Refill  . albuterol (VENTOLIN HFA) 108 (90 Base) MCG/ACT inhaler Inhale 2 puffs into the lungs every 4 (four) hours as needed for wheezing or shortness of breath (cough, shortness of breath or wheezing.). 8 g 11  . amLODipine (NORVASC) 5 MG tablet Take 1 tablet (5 mg total) by mouth daily. 30 tablet 6  . aspirin 81 MG EC tablet Take 1 tablet (81 mg total) by mouth daily. 30 tablet 6  . blood glucose meter kit and supplies KIT Dispense based on patient and insurance preference. Use up to four times daily as directed. (FOR ICD-9 250.00, 250.01). 1 each 0  . ferrous sulfate 325 (65 FE) MG tablet Take 1 tablet (325 mg  total) by mouth 2 (two) times daily. 60 tablet 6  . glipiZIDE (GLUCOTROL) 10 MG tablet Take 1 tablet (10 mg total) by mouth 2 (two) times daily before a meal. 60 tablet 6  . hydrALAZINE (APRESOLINE) 25 MG tablet Take 1 tablet (25 mg total) by mouth every 8 (eight) hours. 270 tablet 3  . hydrochlorothiazide (HYDRODIURIL) 25 MG tablet Take 1 tablet by mouth once daily 30 tablet 3  . losartan (COZAAR) 100 MG tablet Take 1 tablet (100 mg total) by mouth daily. 90 tablet 3  . metFORMIN (GLUCOPHAGE) 1000 MG tablet Take 1 tablet (1,000 mg total) by mouth 2 (two) times daily with a meal. 180 tablet 3  . metoprolol succinate (TOPROL-XL) 100 MG 24 hr tablet Take 1 tablet (100 mg total) by mouth daily. Take with or immediately following a meal. 90 tablet 3  . nitroGLYCERIN (NITROSTAT) 0.4 MG SL tablet Place 1 tablet (0.4 mg total) under  the tongue every 5 (five) minutes x 3 doses as needed for chest pain. 100 tablet 6  . Omega-3 Fatty Acids (FISH OIL) 1000 MG CAPS Take by mouth.     . spironolactone (ALDACTONE) 25 MG tablet Take 1 tablet by mouth once daily 30 tablet 3  . vitamin B-12 (CYANOCOBALAMIN) 100 MCG tablet Take 100 mcg by mouth daily.    . Vitamin D, Ergocalciferol, (DRISDOL) 1.25 MG (50000 UT) CAPS capsule Take 1 capsule (50,000 Units total) by mouth every 7 (seven) days. 5 capsule 6   No facility-administered medications prior to visit.    Allergies  Allergen Reactions  . Crestor [Rosuvastatin]     MUSCLE ACHES HEADACHES   . Hydrocodone Itching  . Other Hives, Swelling and Other (See Comments)    Vision changes; ALLERGIC TO ALL NUTS EXCEPT PEANUTS  . Oxycodone Itching  . Pravachol [Pravastatin Sodium] Itching    Joint pain  . Simvastatin Hives    ROS Review of Systems  Constitutional: Negative.   HENT: Negative.   Eyes: Negative.   Respiratory: Negative.   Cardiovascular: Negative.   Gastrointestinal: Positive for abdominal distention (obese).  Endocrine: Positive for  polydipsia, polyphagia and polyuria.  Genitourinary: Positive for vaginal discharge.  Musculoskeletal: Positive for arthralgias (generalized ).  Skin: Negative.   Allergic/Immunologic: Negative.   Neurological: Positive for dizziness (occasional) and headaches (occasional ).  Hematological: Negative.   Psychiatric/Behavioral: Positive for confusion.      Objective:    Physical Exam Vitals and nursing note reviewed.  Constitutional:      Appearance: Normal appearance. She is obese.  HENT:     Head: Normocephalic and atraumatic.     Nose: Nose normal.     Mouth/Throat:     Mouth: Mucous membranes are moist.     Pharynx: Oropharynx is clear.  Cardiovascular:     Rate and Rhythm: Normal rate and regular rhythm.     Pulses: Normal pulses.     Heart sounds: Normal heart sounds.  Pulmonary:     Effort: Pulmonary effort is normal.     Breath sounds: Normal breath sounds.  Abdominal:     General: Bowel sounds are normal.     Palpations: Abdomen is soft.  Musculoskeletal:        General: Normal range of motion.     Cervical back: Normal range of motion and neck supple.  Skin:    General: Skin is warm and dry.  Neurological:     General: No focal deficit present.     Mental Status: She is alert and oriented to person, place, and time.  Psychiatric:        Mood and Affect: Mood normal.        Behavior: Behavior normal.        Thought Content: Thought content normal.        Judgment: Judgment normal.     BP 138/79 (BP Location: Left Arm, Patient Position: Sitting, Cuff Size: Large)   Pulse (!) 102   Temp (!) 97.3 F (36.3 C) (Temporal)   Ht 5' 8"  (1.727 m)   Wt 294 lb (133.4 kg)   LMP 05/09/2012   SpO2 94%   BMI 44.70 kg/m  Wt Readings from Last 3 Encounters:  10/04/20 294 lb (133.4 kg)  06/26/20 (!) 301 lb 12.8 oz (136.9 kg)  01/01/20 (!) 309 lb 9.6 oz (140.4 kg)     Health Maintenance Due  Topic Date Due  . Hepatitis C Screening  Never  done  . COVID-19  Vaccine (1) Never done  . COLONOSCOPY (Pts 45-64yr Insurance coverage will need to be confirmed)  Never done  . PAP SMEAR-Modifier  06/17/2015  . FOOT EXAM  02/25/2018  . OPHTHALMOLOGY EXAM  07/13/2018  . INFLUENZA VACCINE  Never done    There are no preventive care reminders to display for this patient.  Lab Results  Component Value Date   TSH 0.711 01/01/2020   Lab Results  Component Value Date   WBC 15.5 (H) 12/05/2019   HGB 12.5 12/05/2019   HCT 41.7 12/05/2019   MCV 73.8 (L) 12/05/2019   PLT 361 12/05/2019   Lab Results  Component Value Date   NA 138 12/05/2019   K 3.8 12/05/2019   CO2 26 12/05/2019   GLUCOSE 215 (H) 12/05/2019   BUN 5 (L) 12/05/2019   CREATININE 0.63 12/05/2019   BILITOT 0.6 10/03/2019   ALKPHOS 67 10/03/2019   AST 23 10/03/2019   ALT 22 10/03/2019   PROT 7.8 10/03/2019   ALBUMIN 4.6 10/03/2019   CALCIUM 9.2 12/05/2019   ANIONGAP 12 12/05/2019   GFR 143.59 03/16/2014   Lab Results  Component Value Date   CHOL 203 (H) 10/03/2019   Lab Results  Component Value Date   HDL 51 10/03/2019   Lab Results  Component Value Date   LDLCALC 137 (H) 10/03/2019   Lab Results  Component Value Date   TRIG 83 10/03/2019   Lab Results  Component Value Date   CHOLHDL 4.0 10/03/2019   Lab Results  Component Value Date   HGBA1C 13.6 (H) 06/10/2020      Assessment & Plan:   1. Diabetes mellitus due to underlying condition with hyperosmolarity without coma, without long-term current use of insulin ([place] She will continue medication as prescribed, to decrease foods/beverages high in sugars and carbs and follow Heart Healthy or DASH diet. Increase physical activity to at least 30 minutes cardio exercise daily.  - Glucose (CBG) - Microalbumin/Creatinine Ratio, Urine - POCT Urinalysis Dipstick - Hemoglobin A1c  2. Hemoglobin A1c greater than 9.0% Hgb A1c >14.0 today. Monitor. - Microalbumin/Creatinine Ratio, Urine - POCT Urinalysis Dipstick -  Hemoglobin A1c  3. Type 2 diabetes mellitus with other circulatory complication, unspecified whether long term insulin use (H[facility]) - Microalbumin/Creatinine Ratio, Urine - POCT Urinalysis Dipstick - Hemoglobin A1c  4. Hyperglycemia - POCT Urinalysis Dipstick - Hemoglobin A1c  5. Essential hypertension The current medical regimen is effective; blood pressure is stable at 138/79 today; continue present plan and medications as prescribed. She will continue to take medications as prescribed, to decrease high sodium intake, excessive alcohol intake, increase potassium intake, smoking cessation, and increase physical activity of at least 30 minutes of cardio activity daily. She will continue to follow Heart Healthy or DASH diet.  6. Dyspnea on exertion Stable.   7. Class 3 severe obesity due to excess calories with serious comorbidity and body mass index (BMI) of 45.0 to 49.9 in adult ([facility] Body mass index is 44.7 kg/m. Goal BMI  is <30. Encouraged efforts to reduce weight include engaging in physical activity as tolerated with goal of 150 minutes per week. Improve dietary choices and eat a meal regimen consistent with a Mediterranean or DASH diet. Reduce simple carbohydrates. Do not skip meals and eat healthy snacks throughout the day to avoid over-eating at dinner. Set a goal weight loss that is achievable for you.  8. Healthcare maintenance - POCT Urinalysis Dipstick - Hemoglobin A1c - Comprehensive  metabolic panel - CBC with Differential - TSH - Lipid Panel - Vitamin B12 - Vitamin D, 25-hydroxy  9. Follow up She will keep previously scheduled follow up appointment.   No orders of the defined types were placed in this encounter.   Orders Placed This Encounter  Procedures  . Microalbumin/Creatinine Ratio, Urine  . Hemoglobin A1c  . Comprehensive metabolic panel  . CBC with Differential  . TSH  . Lipid Panel  . Vitamin B12  . Vitamin D, 25-hydroxy  . Glucose (CBG)  . POCT  Urinalysis Dipstick    Referral Orders  No referral(s) requested today    Kathe Becton, MSN, ANE, FNP-BC [facility] [place], [place] [address] [phone]- fax   Problem List Items Addressed This Visit      Endocrine   DM (diabetes mellitus) ([place]) - Primary   Relevant Orders   Glucose (CBG) (Completed)   Microalbumin/Creatinine Ratio, Urine   POCT Urinalysis Dipstick   Hemoglobin A1c   Microalbumin/Creatinine Ratio, Urine   POCT Urinalysis Dipstick   Hemoglobin A1c     Other   Class 3 severe obesity due to excess calories with serious comorbidity and body mass index (BMI) of 45.0 to 49.9 in adult (H[facility])   Dyspnea on exertion   Hemoglobin A1c greater than 9.0%   Relevant Orders   Microalbumin/Creatinine Ratio, Urine   POCT Urinalysis Dipstick   Hemoglobin A1c   Hyperglycemia   Relevant Orders   POCT Urinalysis Dipstick   Hemoglobin A1c    Other Visit Diagnoses    Essential hypertension       Follow up       Healthcare maintenance       Relevant Orders   POCT Urinalysis Dipstick   Hemoglobin A1c   Comprehensive metabolic panel   CBC with Differential   TSH   Lipid Panel   Vitamin B12   Vitamin D, 25-hydroxy      No orders of the defined types were placed in this encounter.   Follow-up: No follow-ups on file.    Azzie Glatter, FNP

## 2020-10-05 LAB — MICROALBUMIN / CREATININE URINE RATIO
Creatinine, Urine: 103.5 mg/dL
Microalb/Creat Ratio: 48 mg/g creat — ABNORMAL HIGH (ref 0–29)
Microalbumin, Urine: 49.2 ug/mL

## 2020-10-07 ENCOUNTER — Other Ambulatory Visit: Payer: Self-pay | Admitting: Family Medicine

## 2020-10-07 ENCOUNTER — Telehealth: Payer: Self-pay | Admitting: Family Medicine

## 2020-10-07 DIAGNOSIS — R059 Cough, unspecified: Secondary | ICD-10-CM

## 2020-10-07 LAB — CBC WITH DIFFERENTIAL/PLATELET
Basophils Absolute: 0 10*3/uL (ref 0.0–0.2)
Basos: 0 %
EOS (ABSOLUTE): 0.2 10*3/uL (ref 0.0–0.4)
Eos: 2 %
Hematocrit: 43.7 % (ref 34.0–46.6)
Hemoglobin: 13.3 g/dL (ref 11.1–15.9)
Immature Grans (Abs): 0.1 10*3/uL (ref 0.0–0.1)
Immature Granulocytes: 1 %
Lymphocytes Absolute: 3.3 10*3/uL — ABNORMAL HIGH (ref 0.7–3.1)
Lymphs: 33 %
MCH: 21.8 pg — ABNORMAL LOW (ref 26.6–33.0)
MCHC: 30.4 g/dL — ABNORMAL LOW (ref 31.5–35.7)
MCV: 72 fL — ABNORMAL LOW (ref 79–97)
Monocytes Absolute: 0.9 10*3/uL (ref 0.1–0.9)
Monocytes: 9 %
NRBC: 1 % — ABNORMAL HIGH (ref 0–0)
Neutrophils Absolute: 5.5 10*3/uL (ref 1.4–7.0)
Neutrophils: 55 %
Platelets: 527 10*3/uL — ABNORMAL HIGH (ref 150–450)
RBC: 6.11 x10E6/uL — ABNORMAL HIGH (ref 3.77–5.28)
RDW: 13.6 % (ref 11.7–15.4)
WBC: 10 10*3/uL (ref 3.4–10.8)

## 2020-10-07 LAB — HEMOGLOBIN A1C
Est. average glucose Bld gHb Est-mCnc: 369 mg/dL
Hgb A1c MFr Bld: 14.5 % — ABNORMAL HIGH (ref 4.8–5.6)

## 2020-10-07 LAB — COMPREHENSIVE METABOLIC PANEL
ALT: 40 IU/L — ABNORMAL HIGH (ref 0–32)
AST: 24 IU/L (ref 0–40)
Albumin/Globulin Ratio: 1 — ABNORMAL LOW (ref 1.2–2.2)
Albumin: 4.2 g/dL (ref 3.8–4.9)
Alkaline Phosphatase: 76 IU/L (ref 44–121)
BUN/Creatinine Ratio: 15 (ref 9–23)
BUN: 13 mg/dL (ref 6–24)
Bilirubin Total: 0.3 mg/dL (ref 0.0–1.2)
CO2: 22 mmol/L (ref 20–29)
Calcium: 9.7 mg/dL (ref 8.7–10.2)
Chloride: 90 mmol/L — ABNORMAL LOW (ref 96–106)
Creatinine, Ser: 0.89 mg/dL (ref 0.57–1.00)
GFR calc Af Amer: 86 mL/min/{1.73_m2} (ref >59)
GFR calc non Af Amer: 74 mL/min/{1.73_m2} (ref >59)
Globulin, Total: 4.2 g/dL (ref 1.5–4.5)
Glucose: 402 mg/dL — ABNORMAL HIGH (ref 65–99)
Potassium: 4.6 mmol/L (ref 3.5–5.2)
Sodium: 133 mmol/L — ABNORMAL LOW (ref 134–144)
Total Protein: 8.4 g/dL (ref 6.0–8.5)

## 2020-10-07 LAB — VITAMIN B12: Vitamin B-12: >2000 pg/mL — ABNORMAL HIGH (ref 232–1245)

## 2020-10-07 LAB — LIPID PANEL
Chol/HDL Ratio: 7.2 ratio — ABNORMAL HIGH (ref 0.0–4.4)
Cholesterol, Total: 224 mg/dL — ABNORMAL HIGH (ref 100–199)
HDL: 31 mg/dL — ABNORMAL LOW (ref >39)
LDL Chol Calc (NIH): 155 mg/dL — ABNORMAL HIGH (ref 0–99)
Triglycerides: 207 mg/dL — ABNORMAL HIGH (ref 0–149)
VLDL Cholesterol Cal: 38 mg/dL (ref 5–40)

## 2020-10-07 LAB — VITAMIN D 25 HYDROXY (VIT D DEFICIENCY, FRACTURES): Vit D, 25-Hydroxy: 24.3 ng/mL — ABNORMAL LOW (ref 30.0–100.0)

## 2020-10-07 LAB — TSH: TSH: 0.746 u[IU]/mL (ref 0.450–4.500)

## 2020-10-07 MED ORDER — BENZONATATE 100 MG PO CAPS: 100.0000 mg | ORAL_CAPSULE | Freq: Two times a day (BID) | ORAL | 0 refills | Status: DC | PRN

## 2020-10-07 NOTE — Telephone Encounter (Signed)
Done

## 2020-10-09 ENCOUNTER — Ambulatory Visit: Payer: 59 | Admitting: Family Medicine

## 2020-10-09 ENCOUNTER — Other Ambulatory Visit: Payer: Self-pay | Admitting: Family Medicine

## 2020-10-09 DIAGNOSIS — R7309 Other abnormal glucose: Secondary | ICD-10-CM

## 2020-10-09 DIAGNOSIS — E0800 Diabetes mellitus due to underlying condition with hyperosmolarity without nonketotic hyperglycemic-hyperosmolar coma (NKHHC): Secondary | ICD-10-CM

## 2020-10-09 MED ORDER — LANTUS SOLOSTAR 100 UNIT/ML ~~LOC~~ SOPN: 30.0000 [IU] | PEN_INJECTOR | Freq: Every day | SUBCUTANEOUS | 99 refills | Status: DC

## 2020-10-09 MED ORDER — INSULIN ASPART 100 UNIT/ML ~~LOC~~ SOLN: 20.0000 [IU] | Freq: Three times a day (TID) | SUBCUTANEOUS | 99 refills | Status: DC

## 2020-10-10 ENCOUNTER — Encounter: Payer: Self-pay | Admitting: Family Medicine

## 2020-10-11 ENCOUNTER — Telehealth: Payer: Self-pay

## 2020-10-11 NOTE — Telephone Encounter (Signed)
Called and spoke about going  Medication that was sent to the pharmacy , pt said that that she is going to make lifestyle changes and at this time she didn't want to go on insulin. She going to monitor her glucose and watch her diet.

## 2020-10-11 NOTE — Telephone Encounter (Signed)
-----   Message from Azzie Glatter, [place] sent at 10/10/2020  5:21 PM EST ----- Please inform patient that we sent New Rxs for Insulin to pharmacy today. She is take as prescribed, monitor blood glucose levels 3 times daily, and keep follow up appointment. Thank you.

## 2020-11-29 ENCOUNTER — Encounter: Payer: Self-pay | Admitting: Cardiology

## 2020-11-29 ENCOUNTER — Ambulatory Visit (INDEPENDENT_AMBULATORY_CARE_PROVIDER_SITE_OTHER): Payer: 59 | Admitting: Cardiology

## 2020-11-29 ENCOUNTER — Other Ambulatory Visit: Payer: Self-pay

## 2020-11-29 VITALS — BP 120/80 | HR 84 | Ht 68.0 in | Wt 288.0 lb

## 2020-11-29 DIAGNOSIS — I428 Other cardiomyopathies: Secondary | ICD-10-CM

## 2020-11-29 DIAGNOSIS — E7800 Pure hypercholesterolemia, unspecified: Secondary | ICD-10-CM

## 2020-11-29 NOTE — Progress Notes (Signed)
Cardiology Office Note:    Date:  11/29/2020   ID:  Claudia [name], DOB September 12, 1966, MRN [id number]  PCP:  Azzie Glatter, [place]  Cardiologist:  Candee Furbish, MD  Advanced Practice Provider:  No care team member to display Electrophysiologist:  None       Referring MD: Azzie Glatter, FNP     History of Present Illness:    Claudia [name] is a 55 y.o. female here for the follow-up of chronic systolic heart failure EF 40 to 45% that subsequently resolved to normal EF of 60%.  Prior heart catheterization showed normal coronary arteries.  Nonischemic cardiomyopathy.  Other issues include hypertension morbid obesity and uncontrolled diabetes.  Has had panic attacks in the past.  Has had some issues with dizziness and spinning thought it was Toprol  Past Medical History:  Diagnosis Date  . Diabetes mellitus   . Fibroids   . Headache(784.0)   . History of echocardiogram    Echo 7/18: Moderate concentric LVH, EF 55-60, normal wall motion, grade 1 diastolic dysfunction, calcified aortic leaflets  . Hypercholesterolemia   . Hyperlipidemia   . Hypertension   . Osteoarthritis    , with mild meniscus tear, followed by orthopedics--Dr Gladstone Lighter 04/29/10  . Right knee pain   . Seizures ([place])    as child  . Vitamin D deficiency     Past Surgical History:  Procedure Laterality Date  . ANTERIOR CERVICAL DECOMP/DISCECTOMY FUSION N/A 09/07/2013   Procedure: ANTERIOR CERVICAL DECOMPRESSION/DISCECTOMY FUSION 1 LEVEL;  Surgeon: Sinclair Ship, MD;  Location: [facility];  Service: Orthopedics;  Laterality: N/A;  Anterior cervical decompression fusion, cervical 5-6 with instrumentation and allograft  . LAPAROSCOPIC ASSISTED VAGINAL HYSTERECTOMY  1/14  . LEFT HEART CATH AND CORONARY ANGIOGRAPHY N/A 04/25/2018   Procedure: LEFT HEART CATH AND CORONARY ANGIOGRAPHY;  Surgeon: Lorretta Harp, MD;  Location: [facility];  Service: Cardiovascular;   Laterality: N/A;  . SHOULDER SURGERY Right 11   rotator cuff  . TUBAL LIGATION    . UMBILICAL HERNIA REPAIR      Current Medications: Current Meds  Medication Sig  . albuterol (VENTOLIN HFA) 108 (90 Base) MCG/ACT inhaler Inhale 2 puffs into the lungs every 4 (four) hours as needed for wheezing or shortness of breath (cough, shortness of breath or wheezing.).  Marland Kitchen amLODipine (NORVASC) 5 MG tablet Take 1 tablet (5 mg total) by mouth daily.  Marland Kitchen aspirin 81 MG EC tablet Take 1 tablet (81 mg total) by mouth daily.  . benzonatate (TESSALON) 100 MG capsule Take 1 capsule (100 mg total) by mouth 2 (two) times daily as needed for cough.  . blood glucose meter kit and supplies KIT Dispense based on patient and insurance preference. Use up to four times daily as directed. (FOR ICD-9 250.00, 250.01).  . ferrous sulfate 325 (65 FE) MG tablet Take 1 tablet (325 mg total) by mouth 2 (two) times daily.  Marland Kitchen glipiZIDE (GLUCOTROL) 10 MG tablet Take 1 tablet (10 mg total) by mouth 2 (two) times daily before a meal.  . hydrALAZINE (APRESOLINE) 25 MG tablet Take 1 tablet (25 mg total) by mouth every 8 (eight) hours.  . hydrochlorothiazide (HYDRODIURIL) 25 MG tablet Take 1 tablet by mouth once daily  . insulin aspart (NOVOLOG) 100 UNIT/ML injection Inject 20 Units into the skin 3 (three) times daily before meals.  . insulin glargine (LANTUS SOLOSTAR) 100 UNIT/ML Solostar Pen Inject 30 Units into  the skin at bedtime.  Marland Kitchen losartan (COZAAR) 100 MG tablet Take 1 tablet (100 mg total) by mouth daily. (Patient taking differently: Take 50 mg by mouth daily.)  . metFORMIN (GLUCOPHAGE) 1000 MG tablet Take 1 tablet (1,000 mg total) by mouth 2 (two) times daily with a meal.  . metoprolol succinate (TOPROL-XL) 100 MG 24 hr tablet Take 1 tablet (100 mg total) by mouth daily. Take with or immediately following a meal.  . nitroGLYCERIN (NITROSTAT) 0.4 MG SL tablet Place 1 tablet (0.4 mg total) under the tongue every 5 (five) minutes x  3 doses as needed for chest pain.  . Omega-3 Fatty Acids (FISH OIL) 1000 MG CAPS Take by mouth.   . spironolactone (ALDACTONE) 25 MG tablet Take 1 tablet by mouth once daily  . vitamin B-12 (CYANOCOBALAMIN) 100 MCG tablet Take 100 mcg by mouth daily.  . Vitamin D, Ergocalciferol, (DRISDOL) 1.25 MG (50000 UT) CAPS capsule Take 1 capsule (50,000 Units total) by mouth every 7 (seven) days.     Allergies:   Crestor [rosuvastatin], Hydrocodone, Other, Oxycodone, Pravachol [pravastatin sodium], and Simvastatin   Social History   Socioeconomic History  . Marital status: Married    Spouse name: Not on file  . Number of children: Not on file  . Years of education: Not on file  . Highest education level: Not on file  Occupational History  . Not on file  Tobacco Use  . Smoking status: Never Smoker  . Smokeless tobacco: Never Used  Vaping Use  . Vaping Use: Never used  Substance and Sexual Activity  . Alcohol use: No  . Drug use: No  . Sexual activity: Yes  Other Topics Concern  . Not on file  Social History Narrative  . Not on file   Social Determinants of Health   Financial Resource Strain: Not on file  Food Insecurity: Not on file  Transportation Needs: Not on file  Physical Activity: Not on file  Stress: Not on file  Social Connections: Not on file     Family History: The patient's family history includes CVA in her maternal grandmother; Diabetes in her maternal grandmother and mother; Hypercholesterolemia in her mother; Hypertension in her maternal grandmother, mother, and sister. There is no history of Heart attack.  ROS:   Please see the history of present illness.     All other systems reviewed and are negative.  EKGs/Labs/Other Studies Reviewed:    The following studies were reviewed today:   EKG:  EKG is  ordered today.  The ekg ordered today demonstrates sinus rhythm 84 no other abnormalities  Recent Labs: 10/04/2020: ALT 40; BUN 13; Creatinine, Ser 0.89;  Hemoglobin 13.3; Platelets 527; Potassium 4.6; Sodium 133; TSH 0.746  Recent Lipid Panel    Component Value Date/Time   CHOL 224 (H) 10/04/2020 1607   TRIG 207 (H) 10/04/2020 1607   HDL 31 (L) 10/04/2020 1607   CHOLHDL 7.2 (H) 10/04/2020 1607   CHOLHDL 3.8 02/25/2017 1536   VLDL 25 02/25/2017 1536   LDLCALC 155 (H) 10/04/2020 1607     Risk Assessment/Calculations:      Physical Exam:    VS:  BP 120/80   Pulse 84   Ht 5' 8"  (1.727 m)   Wt 288 lb (130.6 kg)   LMP 05/09/2012   SpO2 98%   BMI 43.79 kg/m     Wt Readings from Last 3 Encounters:  11/29/20 288 lb (130.6 kg)  10/04/20 294 lb (133.4 kg)  06/26/20 Marland Kitchen)  301 lb 12.8 oz (136.9 kg)     GEN:  Well nourished, well developed in no acute distress HEENT: Normal NECK: No JVD; No carotid bruits LYMPHATICS: No lymphadenopathy CARDIAC: RRR,  murmurs, rubs, gallops RESPIRATORY:  Clear to auscultation without rales, wheezing or rhonchi  ABDOMEN: Soft, non-tender, non-distended MUSCULOSKELETAL:  No edema; No deformity  SKIN: Warm and dry NEUROLOGIC:  Alert and oriented x 3 PSYCHIATRIC:  Normal affect   ASSESSMENT:    1. NICM (nonischemic cardiomyopathy) ([place])   2. Pure hypercholesterolemia   3. Morbid obesity due to excess calories (H[facility])    PLAN:    In order of problems listed above:  Nonischemic cardiomyopathy -Chronic systolic heart failure, EF is improved.  Breathing is improved.  NYHA class I.  Excellent.  Morbid obesity -Continue to encourage weight loss.  Good job she is down about 20 pounds.  Essential hypertension -Fairly well blood pressure control.  Previously changed her metoprolol to nighttime.  Feels better.  Diabetes with hyperlipidemia -Statin intolerant -Hemoglobin A1c markedly elevated at 14.5.  She is now on insulin.  I expressed the importance of good blood pressure control.       Medication Adjustments/Labs and Tests Ordered: Current medicines are reviewed at length with the patient  today.  Concerns regarding medicines are outlined above.  Orders Placed This Encounter  Procedures  . EKG 12-Lead   No orders of the defined types were placed in this encounter.   Patient Instructions  Medication Instructions:  The current medical regimen is effective;  continue present plan and medications.  *If you need a refill on your cardiac medications before your next appointment, please call your pharmacy*  Follow-Up: At [facility], you and your health needs are our priority.  As part of our continuing mission to provide you with exceptional heart care, we have created designated Provider Care Teams.  These Care Teams include your primary Cardiologist (physician) and Advanced Practice Providers (APPs -  Physician Assistants and Nurse Practitioners) who all work together to provide you with the care you need, when you need it.  We recommend signing up for the patient portal called "MyChart".  Sign up information is provided on this After Visit Summary.  MyChart is used to connect with patients for Virtual Visits (Telemedicine).  Patients are able to view lab/test results, encounter notes, upcoming appointments, etc.  Non-urgent messages can be sent to your provider as well.   To learn more about what you can do with MyChart, go to [email or web address].    Your next appointment:   6 month(s)  The format for your next appointment:   In Person  Provider:   Candee Furbish, MD  Thank you for choosing [facility]!!        Signed, Candee Furbish, MD  11/29/2020 3:51 PM    [place]

## 2020-11-29 NOTE — Patient Instructions (Signed)

## 2020-12-11 ENCOUNTER — Other Ambulatory Visit: Payer: Self-pay | Admitting: Family Medicine

## 2020-12-30 ENCOUNTER — Ambulatory Visit (INDEPENDENT_AMBULATORY_CARE_PROVIDER_SITE_OTHER): Payer: 59 | Admitting: Family Medicine

## 2020-12-30 ENCOUNTER — Encounter: Payer: Self-pay | Admitting: Family Medicine

## 2020-12-30 ENCOUNTER — Other Ambulatory Visit: Payer: Self-pay

## 2020-12-30 VITALS — BP 150/81 | HR 91 | Ht 68.0 in | Wt 291.0 lb

## 2020-12-30 DIAGNOSIS — E1151 Type 2 diabetes mellitus with diabetic peripheral angiopathy without gangrene: Secondary | ICD-10-CM | POA: Diagnosis not present

## 2020-12-30 DIAGNOSIS — I10 Essential (primary) hypertension: Secondary | ICD-10-CM

## 2020-12-30 DIAGNOSIS — R7309 Other abnormal glucose: Secondary | ICD-10-CM | POA: Diagnosis not present

## 2020-12-30 DIAGNOSIS — R739 Hyperglycemia, unspecified: Secondary | ICD-10-CM | POA: Diagnosis not present

## 2020-12-30 DIAGNOSIS — Z6841 Body Mass Index (BMI) 40.0 and over, adult: Secondary | ICD-10-CM

## 2020-12-30 DIAGNOSIS — Z09 Encounter for follow-up examination after completed treatment for conditions other than malignant neoplasm: Secondary | ICD-10-CM

## 2020-12-30 DIAGNOSIS — Z794 Long term (current) use of insulin: Secondary | ICD-10-CM

## 2020-12-30 DIAGNOSIS — R634 Abnormal weight loss: Secondary | ICD-10-CM

## 2020-12-30 LAB — POCT GLYCOSYLATED HEMOGLOBIN (HGB A1C): Hemoglobin A1C: 9.1 % — AB (ref 4.0–5.6)

## 2020-12-30 NOTE — Progress Notes (Signed)
[facility]    Established Patient Office Visit  Subjective:  Patient ID: Claudia [name], female    DOB: 04-17-1966  Age: 55 y.o. MRN: [id number]  CC:  Chief Complaint  Patient presents with  . Diabetes    HPI Claudia [name] is a 55 year old female who presents for Follow Up today.   Patient Active Problem List   Diagnosis Date Noted  . Hemoglobin A1c greater than 9.0% 10/04/2019  . Weight loss 10/04/2019  . Hx of non-ST elevation myocardial infarction (NSTEMI) 06/16/2019  . Class 3 severe obesity due to excess calories with serious comorbidity and body mass index (BMI) of 45.0 to 49.9 in adult ([place]) 06/16/2019  . Hyperglycemia 06/16/2019  . Hypertensive heart disease with systolic congestive heart failure ([place])   . NSTEMI (non-ST elevated myocardial infarction) ([place]) 04/24/2018  . ACS (acute coronary syndrome) ([place]) 04/24/2018  . Chronic pain of left knee 07/15/2017  . Chronic pain of right knee 07/15/2017  . Unilateral primary osteoarthritis, left knee 07/15/2017  . Unilateral primary osteoarthritis, right knee 07/15/2017  . Chest pain   . Benign essential HTN   . Cardiomyopathy ([place]) 03/16/2014  . Radiculopathy 09/07/2013  . Hypokalemia 06/02/2012  . HTN (hypertension) 06/01/2012  . DM (diabetes mellitus) ([place]) 06/01/2012  . Hyperlipidemia 06/01/2012  . Dyspnea on exertion 06/01/2012  . Morbid obesity ([place]) 06/01/2012   Current Status: Since her last office visit, she is doing well with no complaints. Her most recent normal range of preprandial blood glucose levels have been between 110-120. She has seen low range of 96 and high of 200 since his last office visit. She denies fatigue, frequent urination, blurred vision, excessive hunger, excessive thirst, weight gain, weight loss, and poor wound healing. She continues to check her feet regularly. She denies visual changes, chest pain, cough, shortness of breath, heart  palpitations, and falls. She has occasional headaches and dizziness with position changes. Denies severe headaches, confusion, seizures, double vision, and blurred vision, nausea and vomiting. She denies fevers, chills, recent infections, weight loss, and night sweats.  Denies GI problems such as diarrhea, and constipation. She has no reports of blood in stools, dysuria and hematuria. No depression or anxiety reported today. She is taking all medications as prescribed. She denies pain today.   Past Medical History:  Diagnosis Date  . Diabetes mellitus   . Fibroids   . Headache(784.0)   . History of echocardiogram    Echo 7/18: Moderate concentric LVH, EF 55-60, normal wall motion, grade 1 diastolic dysfunction, calcified aortic leaflets  . Hypercholesterolemia   . Hyperlipidemia   . Hypertension   . Osteoarthritis    , with mild meniscus tear, followed by orthopedics--Dr Gladstone Lighter 04/29/10  . Right knee pain   . Seizures ([place])    as child  . Vitamin D deficiency     Past Surgical History:  Procedure Laterality Date  . ANTERIOR CERVICAL DECOMP/DISCECTOMY FUSION N/A 09/07/2013   Procedure: ANTERIOR CERVICAL DECOMPRESSION/DISCECTOMY FUSION 1 LEVEL;  Surgeon: Sinclair Ship, MD;  Location: [place];  Service: Orthopedics;  Laterality: N/A;  Anterior cervical decompression fusion, cervical 5-6 with instrumentation and allograft  . LAPAROSCOPIC ASSISTED VAGINAL HYSTERECTOMY  1/14  . LEFT HEART CATH AND CORONARY ANGIOGRAPHY N/A 04/25/2018   Procedure: LEFT HEART CATH AND CORONARY ANGIOGRAPHY;  Surgeon: Lorretta Harp, MD;  Location: [place];  Service: Cardiovascular;  Laterality: N/A;  . SHOULDER SURGERY Right 11  rotator cuff  . TUBAL LIGATION    . UMBILICAL HERNIA REPAIR      Family History  Problem Relation Age of Onset  . Hypertension Mother   . Diabetes Mother   . Hypercholesterolemia Mother   . Hypertension Sister   . CVA Maternal Grandmother   . Hypertension  Maternal Grandmother   . Diabetes Maternal Grandmother   . Heart attack Neg Hx     Social History   Socioeconomic History  . Marital status: Married    Spouse name: Not on file  . Number of children: Not on file  . Years of education: Not on file  . Highest education level: Not on file  Occupational History  . Not on file  Tobacco Use  . Smoking status: Never Smoker  . Smokeless tobacco: Never Used  Vaping Use  . Vaping Use: Never used  Substance and Sexual Activity  . Alcohol use: No  . Drug use: No  . Sexual activity: Yes  Other Topics Concern  . Not on file  Social History Narrative  . Not on file   Social Determinants of Health   Financial Resource Strain: Not on file  Food Insecurity: Not on file  Transportation Needs: Not on file  Physical Activity: Not on file  Stress: Not on file  Social Connections: Not on file  Intimate Partner Violence: Not on file    Outpatient Medications Prior to Visit  Medication Sig Dispense Refill  . albuterol (VENTOLIN HFA) 108 (90 Base) MCG/ACT inhaler Inhale 2 puffs into the lungs every 4 (four) hours as needed for wheezing or shortness of breath (cough, shortness of breath or wheezing.). 8 g 11  . amLODipine (NORVASC) 5 MG tablet Take 1 tablet (5 mg total) by mouth daily. 30 tablet 6  . aspirin 81 MG EC tablet Take 1 tablet (81 mg total) by mouth daily. 30 tablet 6  . benzonatate (TESSALON) 100 MG capsule Take 1 capsule (100 mg total) by mouth 2 (two) times daily as needed for cough. 20 capsule 0  . blood glucose meter kit and supplies KIT Dispense based on patient and insurance preference. Use up to four times daily as directed. (FOR ICD-9 250.00, 250.01). 1 each 0  . ferrous sulfate 325 (65 FE) MG tablet Take 1 tablet (325 mg total) by mouth 2 (two) times daily. 60 tablet 6  . glipiZIDE (GLUCOTROL) 10 MG tablet Take 1 tablet (10 mg total) by mouth 2 (two) times daily before a meal. 60 tablet 6  . glucose blood test strip USE AS  DIRECTED UP TP 4 TIMES DAILY (Patient taking differently: USE AS DIRECTED UP TP 4 TIMES DAILY) 100 strip 0  . hydrALAZINE (APRESOLINE) 25 MG tablet Take 1 tablet (25 mg total) by mouth every 8 (eight) hours. 270 tablet 3  . hydrochlorothiazide (HYDRODIURIL) 25 MG tablet Take 1 tablet by mouth once daily 30 tablet 3  . losartan (COZAAR) 100 MG tablet Take 1 tablet (100 mg total) by mouth daily. (Patient taking differently: Take 50 mg by mouth daily.) 90 tablet 3  . metFORMIN (GLUCOPHAGE) 1000 MG tablet Take 1 tablet (1,000 mg total) by mouth 2 (two) times daily with a meal. 180 tablet 3  . metoprolol succinate (TOPROL-XL) 100 MG 24 hr tablet Take 1 tablet (100 mg total) by mouth daily. Take with or immediately following a meal. 90 tablet 3  . nitroGLYCERIN (NITROSTAT) 0.4 MG SL tablet Place 1 tablet (0.4 mg total) under the tongue every  5 (five) minutes x 3 doses as needed for chest pain. 100 tablet 6  . Omega-3 Fatty Acids (FISH OIL) 1000 MG CAPS Take by mouth.     Glory Rosebush VERIO test strip USE AS DIRECTED UP TP 4 TIMES DAILY 100 strip 0  . spironolactone (ALDACTONE) 25 MG tablet Take 1 tablet by mouth once daily 30 tablet 3  . vitamin B-12 (CYANOCOBALAMIN) 100 MCG tablet Take 100 mcg by mouth daily.    . Vitamin D, Ergocalciferol, (DRISDOL) 1.25 MG (50000 UT) CAPS capsule Take 1 capsule (50,000 Units total) by mouth every 7 (seven) days. 5 capsule 6  . insulin aspart (NOVOLOG) 100 UNIT/ML injection Inject 20 Units into the skin 3 (three) times daily before meals. 10 mL PRN  . insulin glargine (LANTUS SOLOSTAR) 100 UNIT/ML Solostar Pen Inject 30 Units into the skin at bedtime. 15 mL PRN   No facility-administered medications prior to visit.    Allergies  Allergen Reactions  . Crestor [Rosuvastatin]     MUSCLE ACHES HEADACHES   . Hydrocodone Itching  . Other Hives, Swelling and Other (See Comments)    Vision changes; ALLERGIC TO ALL NUTS EXCEPT PEANUTS  . Oxycodone Itching  . Pravachol  [Pravastatin Sodium] Itching    Joint pain  . Simvastatin Hives    ROS Review of Systems  Constitutional: Negative.   HENT: Negative.   Eyes: Negative.   Respiratory: Negative.   Cardiovascular: Negative.   Gastrointestinal: Negative.   Endocrine: Negative.   Genitourinary: Negative.   Musculoskeletal: Positive for arthralgias (generalized joint pain).  Skin: Negative.   Allergic/Immunologic: Negative.   Neurological: Positive for dizziness (occasional ) and headaches (occasional ).  Hematological: Negative.   Psychiatric/Behavioral: Negative.    Objective:    Physical Exam Vitals and nursing note reviewed.  Constitutional:      Appearance: Normal appearance.  HENT:     Head: Normocephalic and atraumatic.     Nose: Nose normal.     Mouth/Throat:     Mouth: Mucous membranes are moist.     Pharynx: Oropharynx is clear.  Cardiovascular:     Rate and Rhythm: Normal rate and regular rhythm.     Pulses: Normal pulses.     Heart sounds: Normal heart sounds.  Pulmonary:     Effort: Pulmonary effort is normal.     Breath sounds: Normal breath sounds.  Abdominal:     General: Bowel sounds are normal. There is distension (obese).     Palpations: Abdomen is soft.  Musculoskeletal:        General: Normal range of motion.     Cervical back: Normal range of motion and neck supple.  Skin:    General: Skin is warm and dry.  Neurological:     General: No focal deficit present.     Mental Status: She is alert and oriented to person, place, and time.  Psychiatric:        Mood and Affect: Mood normal.        Behavior: Behavior normal.        Thought Content: Thought content normal.        Judgment: Judgment normal.     BP (!) 150/81   Pulse 91   Ht 5' 8"  (1.727 m)   Wt 291 lb (132 kg)   LMP 05/09/2012   SpO2 97%   BMI 44.25 kg/m  Wt Readings from Last 3 Encounters:  12/30/20 291 lb (132 kg)  11/29/20 288 lb (130.6 kg)  10/04/20 294 lb (133.4 kg)     Health  Maintenance Due  Topic Date Due  . Hepatitis C Screening  Never done  . COVID-19 Vaccine (1) Never done  . COLONOSCOPY (Pts 45-23yr Insurance coverage will need to be confirmed)  Never done  . PAP SMEAR-Modifier  06/17/2015  . FOOT EXAM  02/25/2018  . OPHTHALMOLOGY EXAM  07/13/2018    There are no preventive care reminders to display for this patient.  Lab Results  Component Value Date   TSH 0.746 10/04/2020   Lab Results  Component Value Date   WBC 10.0 10/04/2020   HGB 13.3 10/04/2020   HCT 43.7 10/04/2020   MCV 72 (L) 10/04/2020   PLT 527 (H) 10/04/2020   Lab Results  Component Value Date   NA 133 (L) 10/04/2020   K 4.6 10/04/2020   CO2 22 10/04/2020   GLUCOSE 402 (H) 10/04/2020   BUN 13 10/04/2020   CREATININE 0.89 10/04/2020   BILITOT 0.3 10/04/2020   ALKPHOS 76 10/04/2020   AST 24 10/04/2020   ALT 40 (H) 10/04/2020   PROT 8.4 10/04/2020   ALBUMIN 4.2 10/04/2020   CALCIUM 9.7 10/04/2020   ANIONGAP 12 12/05/2019   GFR 143.59 03/16/2014   Lab Results  Component Value Date   CHOL 224 (H) 10/04/2020   Lab Results  Component Value Date   HDL 31 (L) 10/04/2020   Lab Results  Component Value Date   LDLCALC 155 (H) 10/04/2020   Lab Results  Component Value Date   TRIG 207 (H) 10/04/2020   Lab Results  Component Value Date   CHOLHDL 7.2 (H) 10/04/2020   Lab Results  Component Value Date   HGBA1C 9.1 (A) 12/30/2020   Assessment & Plan:   1. Type 2 diabetes mellitus with diabetic peripheral angiopathy without gangrene, with long-term current use of insulin ([place] She will continue medication as prescribed, to decrease foods/beverages high in sugars and carbs and follow Heart Healthy or DASH diet. Increase physical activity to at least 30 minutes cardio exercise daily.   2. Hemoglobin A1c greater than 9.0% Improved. Hgb A1c decreased at 9.1 today. Monitor.  - POCT glycosylated hemoglobin (Hb A1C) - POCT urinalysis dipstick  3. Hyperglycemia  4.  Essential hypertension She will continue to take medications as prescribed, to decrease high sodium intake, excessive alcohol intake, increase potassium intake, smoking cessation, and increase physical activity of at least 30 minutes of cardio activity daily. She will continue to follow Heart Healthy or DASH diet. - POCT glycosylated hemoglobin (Hb A1C) - POCT urinalysis dipstick  5. Class 3 severe obesity due to excess calories with serious comorbidity and body mass index (BMI) of 40.0 to 44.9 in adult ([facility] Body mass index is 44.25 kg/m. Goal BMI  is <30. Encouraged efforts to reduce weight include engaging in physical activity as tolerated with goal of 150 minutes per week. Improve dietary choices and eat a meal regimen consistent with a Mediterranean or DASH diet. Reduce simple carbohydrates. Do not skip meals and eat healthy snacks throughout the day to avoid over-eating at dinner. Set a goal weight loss that is achievable for you.  6. Weight loss She has lost 44 lbs in 11 months.   7. Follow up She will follow up in 3 months.  No orders of the defined types were placed in this encounter.   Orders Placed This Encounter  Procedures  . POCT glycosylated hemoglobin (Hb A1C)  . POCT urinalysis dipstick  Referral Orders  No referral(s) requested today    Kathe Becton, MSN, ANE, FNP-BC [facility] [address] [place], [place] [address] [phone]- fax   Problem List Items Addressed This Visit      Endocrine   DM (diabetes mellitus) ([place]) - Primary     Other   Class 3 severe obesity due to excess calories with serious comorbidity and body mass index (BMI) of 45.0 to 49.9 in adult ([place])   Hemoglobin A1c greater than 9.0%   Relevant Orders   POCT glycosylated hemoglobin (Hb A1C) (Completed)   POCT urinalysis dipstick   Hyperglycemia   Weight loss    Other Visit  Diagnoses    Essential hypertension       Relevant Orders   POCT glycosylated hemoglobin (Hb A1C) (Completed)   POCT urinalysis dipstick   Follow up          No orders of the defined types were placed in this encounter.   Follow-up: No follow-ups on file.    Azzie Glatter, FNP

## 2021-01-01 ENCOUNTER — Encounter: Payer: Self-pay | Admitting: Family Medicine

## 2021-01-13 ENCOUNTER — Other Ambulatory Visit: Payer: Self-pay

## 2021-01-13 DIAGNOSIS — I10 Essential (primary) hypertension: Secondary | ICD-10-CM

## 2021-01-13 MED ORDER — LOSARTAN POTASSIUM 100 MG PO TABS: 50.0000 mg | ORAL_TABLET | Freq: Every day | ORAL | 1 refills | Status: DC

## 2021-02-17 ENCOUNTER — Other Ambulatory Visit: Payer: Self-pay | Admitting: Nurse Practitioner

## 2021-02-17 DIAGNOSIS — I10 Essential (primary) hypertension: Secondary | ICD-10-CM

## 2021-02-17 MED ORDER — AMLODIPINE BESYLATE 5 MG PO TABS: 5.0000 mg | ORAL_TABLET | Freq: Every day | ORAL | 6 refills | Status: DC

## 2021-03-19 ENCOUNTER — Other Ambulatory Visit: Payer: Self-pay | Admitting: Nurse Practitioner

## 2021-03-19 DIAGNOSIS — E1159 Type 2 diabetes mellitus with other circulatory complications: Secondary | ICD-10-CM

## 2021-03-19 MED ORDER — GLIPIZIDE 10 MG PO TABS: 10.0000 mg | ORAL_TABLET | Freq: Two times a day (BID) | ORAL | 6 refills | Status: DC

## 2021-04-02 ENCOUNTER — Ambulatory Visit: Payer: 59 | Admitting: Nurse Practitioner

## 2021-05-30 ENCOUNTER — Ambulatory Visit: Payer: Self-pay | Admitting: Nurse Practitioner

## 2021-06-09 ENCOUNTER — Emergency Department (HOSPITAL_COMMUNITY)
Admission: EM | Admit: 2021-06-09 | Discharge: 2021-06-09 | Disposition: A | Discharge status: Home / Self Care | Payer: 59 | Attending: Emergency Medicine | Admitting: Emergency Medicine

## 2021-06-09 ENCOUNTER — Ambulatory Visit: Payer: Self-pay | Admitting: Cardiology

## 2021-06-09 ENCOUNTER — Emergency Department (HOSPITAL_COMMUNITY): Payer: 59

## 2021-06-09 ENCOUNTER — Other Ambulatory Visit: Payer: Self-pay

## 2021-06-09 DIAGNOSIS — E1165 Type 2 diabetes mellitus with hyperglycemia: Secondary | ICD-10-CM | POA: Insufficient documentation

## 2021-06-09 DIAGNOSIS — R5383 Other fatigue: Secondary | ICD-10-CM | POA: Diagnosis not present

## 2021-06-09 DIAGNOSIS — Z7984 Long term (current) use of oral hypoglycemic drugs: Secondary | ICD-10-CM | POA: Insufficient documentation

## 2021-06-09 DIAGNOSIS — R55 Syncope and collapse: Secondary | ICD-10-CM | POA: Insufficient documentation

## 2021-06-09 DIAGNOSIS — I471 Supraventricular tachycardia: Secondary | ICD-10-CM | POA: Diagnosis not present

## 2021-06-09 DIAGNOSIS — R42 Dizziness and giddiness: Secondary | ICD-10-CM | POA: Diagnosis not present

## 2021-06-09 DIAGNOSIS — R61 Generalized hyperhidrosis: Secondary | ICD-10-CM | POA: Insufficient documentation

## 2021-06-09 DIAGNOSIS — R0602 Shortness of breath: Secondary | ICD-10-CM | POA: Diagnosis present

## 2021-06-09 DIAGNOSIS — Z7982 Long term (current) use of aspirin: Secondary | ICD-10-CM | POA: Insufficient documentation

## 2021-06-09 DIAGNOSIS — I119 Hypertensive heart disease without heart failure: Secondary | ICD-10-CM | POA: Diagnosis not present

## 2021-06-09 DIAGNOSIS — Z79899 Other long term (current) drug therapy: Secondary | ICD-10-CM | POA: Insufficient documentation

## 2021-06-09 DIAGNOSIS — I5020 Unspecified systolic (congestive) heart failure: Secondary | ICD-10-CM | POA: Diagnosis not present

## 2021-06-09 LAB — CBC WITH DIFFERENTIAL/PLATELET
Abs Immature Granulocytes: 0.02 10*3/uL (ref 0.00–0.07)
Basophils Absolute: 0 10*3/uL (ref 0.0–0.1)
Basophils Relative: 0 %
Eosinophils Absolute: 0.2 10*3/uL (ref 0.0–0.5)
Eosinophils Relative: 3 %
HCT: 41.5 % (ref 36.0–46.0)
Hemoglobin: 12.8 g/dL (ref 12.0–15.0)
Immature Granulocytes: 0 %
Lymphocytes Relative: 36 %
Lymphs Abs: 2.5 10*3/uL (ref 0.7–4.0)
MCH: 22.7 pg — ABNORMAL LOW (ref 26.0–34.0)
MCHC: 30.8 g/dL (ref 30.0–36.0)
MCV: 73.6 fL — ABNORMAL LOW (ref 80.0–100.0)
Monocytes Absolute: 0.7 10*3/uL (ref 0.1–1.0)
Monocytes Relative: 11 %
Neutro Abs: 3.4 10*3/uL (ref 1.7–7.7)
Neutrophils Relative %: 50 %
Platelets: 455 10*3/uL — ABNORMAL HIGH (ref 150–400)
RBC: 5.64 MIL/uL — ABNORMAL HIGH (ref 3.87–5.11)
RDW: 15.2 % (ref 11.5–15.5)
WBC: 6.9 10*3/uL (ref 4.0–10.5)
nRBC: 0 % (ref 0.0–0.2)

## 2021-06-09 LAB — BASIC METABOLIC PANEL
Anion gap: 9 (ref 5–15)
BUN: 15 mg/dL (ref 6–20)
CO2: 27 mmol/L (ref 22–32)
Calcium: 9.3 mg/dL (ref 8.9–10.3)
Chloride: 97 mmol/L — ABNORMAL LOW (ref 98–111)
Creatinine, Ser: 1.21 mg/dL — ABNORMAL HIGH (ref 0.44–1.00)
GFR, Estimated: 54 mL/min — ABNORMAL LOW (ref >60)
Glucose, Bld: 433 mg/dL — ABNORMAL HIGH (ref 70–99)
Potassium: 5.2 mmol/L — ABNORMAL HIGH (ref 3.5–5.1)
Sodium: 133 mmol/L — ABNORMAL LOW (ref 135–145)

## 2021-06-09 LAB — I-STAT VENOUS BLOOD GAS, ED
Acid-Base Excess: 4 mmol/L — ABNORMAL HIGH (ref 0.0–2.0)
Bicarbonate: 29.5 mmol/L — ABNORMAL HIGH (ref 20.0–28.0)
Calcium, Ion: 1.05 mmol/L — ABNORMAL LOW (ref 1.15–1.40)
HCT: 44 % (ref 36.0–46.0)
Hemoglobin: 15 g/dL (ref 12.0–15.0)
O2 Saturation: 61 %
Potassium: 5.2 mmol/L — ABNORMAL HIGH (ref 3.5–5.1)
Sodium: 135 mmol/L (ref 135–145)
TCO2: 31 mmol/L (ref 22–32)
pCO2, Ven: 44 mmHg (ref 44.0–60.0)
pH, Ven: 7.434 — ABNORMAL HIGH (ref 7.250–7.430)
pO2, Ven: 31 mmHg — CL (ref 32.0–45.0)

## 2021-06-09 LAB — URINALYSIS, ROUTINE W REFLEX MICROSCOPIC
Bilirubin Urine: NEGATIVE
Glucose, UA: >=500 mg/dL — AB
Hgb urine dipstick: NEGATIVE
Ketones, ur: NEGATIVE mg/dL
Leukocytes,Ua: NEGATIVE
Nitrite: NEGATIVE
Protein, ur: NEGATIVE mg/dL
Specific Gravity, Urine: 1.02 (ref 1.005–1.030)
pH: 6 (ref 5.0–8.0)

## 2021-06-09 LAB — CBG MONITORING, ED: Glucose-Capillary: 430 mg/dL — ABNORMAL HIGH (ref 70–99)

## 2021-06-09 LAB — URINALYSIS, MICROSCOPIC (REFLEX)

## 2021-06-09 LAB — OSMOLALITY: Osmolality: 302 mOsm/kg — ABNORMAL HIGH (ref 275–295)

## 2021-06-09 LAB — TROPONIN I (HIGH SENSITIVITY)
Troponin I (High Sensitivity): 11 ng/L (ref <18)
Troponin I (High Sensitivity): 16 ng/L (ref <18)

## 2021-06-09 LAB — BETA-HYDROXYBUTYRIC ACID: Beta-Hydroxybutyric Acid: 0.15 mmol/L (ref 0.05–0.27)

## 2021-06-09 MED ORDER — METOPROLOL TARTRATE 5 MG/5ML IV SOLN
5.0000 mg | Freq: Once | INTRAVENOUS | Status: AC
  Administered 2021-06-09: 5 mg via INTRAVENOUS
  Filled 2021-06-09: qty 5

## 2021-06-09 MED ORDER — LACTATED RINGERS IV BOLUS
1000.0000 mL | Freq: Once | INTRAVENOUS | Status: AC
  Administered 2021-06-09: 1000 mL via INTRAVENOUS

## 2021-06-09 NOTE — ED Notes (Addendum)
Triage nurse Kennyth Lose RN.is awear of patient heartrate  152 ,

## 2021-06-09 NOTE — ED Notes (Signed)
Claudia Rued, MD at bedside.

## 2021-06-09 NOTE — ED Triage Notes (Signed)
Pt arrived c/o high blood sugar and weakness.  Hx of MI and type 2 diabetes. Pt states CBG was 558  Pt was at work and had a near syncopal events with shortness of breath and diaphoresis

## 2021-06-09 NOTE — ED Provider Notes (Addendum)
Emergency Medicine Provider Triage Evaluation Note  Claudia [name] , a 55 y.o. female  was evaluated in triage.  Pt complains of feeling diaphoretic, lightheaded, near syncopal this morning while at work.  CBG 558, palpitations.  History of MI on baby aspirin, history of type 2 diabetes on oral medications.  Review of Systems  Positive: Lightheadedness, near syncope, palpitations, diaphoresis, nausea Negative: Chest pain, vomiting  Physical Exam  BP (!) 127/96 (BP Location: Right Arm)   Pulse (!) 153   Temp 97.9 F (36.6 C)   Resp 17   Ht '5\' 8"'$  (1.727 m)   Wt 136.1 kg   LMP 05/09/2012   SpO2 97%   BMI 45.61 kg/m  Gen:   Awake, no distress   Resp:  Normal effort  MSK:   Moves extremities without difficulty  Other:  Tachycardic with heart rate to the 150s to 160s, regular rhythm.  Lungs CTA B.  Patient very mildly diaphoretic.  Abdomen soft, nondistended, nontender.  Medical Decision Making  Medically screening exam initiated at 10:25 AM.  Appropriate orders placed.  Aftan Pliler was informed that the remainder of the evaluation will be completed by another provider, this initial triage assessment does not replace that evaluation, and the importance of remaining in the ED until their evaluation is complete.  Concerned given degree of tachycardia, charge RN notified this patient should be roomed next.  Patient to be transported directly to room 21.  This chart was dictated using voice recognition software, Dragon. Despite the best efforts of this provider to proofread and correct errors, errors may still occur which can change documentation meaning.    Emeline Darling, PA-C 06/09/21 1026    Fynn Adel, Gypsy Balsam, PA-C 06/09/21 [place], DO 06/09/21 1818

## 2021-06-09 NOTE — ED Notes (Signed)
Did a repeat ekg shown to Dr Maryan Rued patient is resting with call bell in reach

## 2021-06-09 NOTE — Discharge Instructions (Addendum)
You were seen in the Emergency Department for dizziness and high blood sugar. You were found to have an abnormal heart rhythm which improved with medication.  Fortunately your lab work did not show evidence of diabetic ketoacidosis or strain on your heart. Please avoid taking Benadryl for now. It is important to better control your diabetes as well. Follow up with your cardiologist and your primary care doctor.

## 2021-06-09 NOTE — ED Notes (Signed)
Pt verbalizes understanding of discharge instructions. Opportunity for questions and answers were provided. Pt discharged from the ED.   ?

## 2021-06-09 NOTE — ED Provider Notes (Signed)
[facility] EMERGENCY DEPARTMENT Provider Note   CSN: [id number] Arrival date & time: 06/09/21  1962     History Chief Complaint  Patient presents with   Hyperglycemia   Dizziness    Claudia [name] is a 55 y.o. female with hx of DM2, HTN, HLD, and prior NSTEMI presenting with dizziness.  Patient describes dizziness, diaphoresis, and near-syncope sensation this morning. Checked her sugar and found it to be elevated to 558. She also endorses fatigue and mild shortness of breath this morning. Denies palpitations, chest pain, or actual syncopal episode. No abdominal pain, nausea, vomiting.  On arrival patient noted to be in SVT with HR 150s. Patient denies prior hx of abnormal heart rhythm.   States she ate at [employer] last night and also had sweet tea. Also notes she has been taking Benadryl recently.      Past Medical History:  Diagnosis Date   Diabetes mellitus    Fibroids    Headache(784.0)    History of echocardiogram    Echo 7/18: Moderate concentric LVH, EF 55-60, normal wall motion, grade 1 diastolic dysfunction, calcified aortic leaflets   Hypercholesterolemia    Hyperlipidemia    Hypertension    Osteoarthritis    , with mild meniscus tear, followed by orthopedics--Dr Gladstone Lighter 04/29/10   Right knee pain    Seizures ([place])    as child   Vitamin D deficiency     Patient Active Problem List   Diagnosis Date Noted   Hemoglobin A1c greater than 9.0% 10/04/2019   Weight loss 10/04/2019   Hx of non-ST elevation myocardial infarction (NSTEMI) 06/16/2019   Class 3 severe obesity due to excess calories with serious comorbidity and body mass index (BMI) of 45.0 to 49.9 in adult ([place]) 06/16/2019   Hyperglycemia 06/16/2019   Hypertensive heart disease with systolic congestive heart failure (HCC)    NSTEMI (non-ST elevated myocardial infarction) ([place]) 04/24/2018   ACS (acute coronary syndrome) ([place]) 04/24/2018   Chronic pain of left knee 07/15/2017    Chronic pain of right knee 07/15/2017   Unilateral primary osteoarthritis, left knee 07/15/2017   Unilateral primary osteoarthritis, right knee 07/15/2017   Chest pain    Benign essential HTN    Cardiomyopathy ([place]) 03/16/2014   Radiculopathy 09/07/2013   Hypokalemia 06/02/2012   HTN (hypertension) 06/01/2012   DM (diabetes mellitus) ([place]) 06/01/2012   Hyperlipidemia 06/01/2012   Dyspnea on exertion 06/01/2012   Morbid obesity ([place]) 06/01/2012    Past Surgical History:  Procedure Laterality Date   ANTERIOR CERVICAL DECOMP/DISCECTOMY FUSION N/A 09/07/2013   Procedure: ANTERIOR CERVICAL DECOMPRESSION/DISCECTOMY FUSION 1 LEVEL;  Surgeon: Sinclair Ship, MD;  Location: [place];  Service: Orthopedics;  Laterality: N/A;  Anterior cervical decompression fusion, cervical 5-6 with instrumentation and allograft   LAPAROSCOPIC ASSISTED VAGINAL HYSTERECTOMY  1/14   LEFT HEART CATH AND CORONARY ANGIOGRAPHY N/A 04/25/2018   Procedure: LEFT HEART CATH AND CORONARY ANGIOGRAPHY;  Surgeon: Lorretta Harp, MD;  Location: [place] [facility];  Service: Cardiovascular;  Laterality: N/A;   SHOULDER SURGERY Right 11   rotator cuff   TUBAL LIGATION     UMBILICAL HERNIA REPAIR       OB History     Gravida  4   Para  4   Term  3   Preterm  1   AB      Living  3      SAB      IAB  Ectopic      Multiple      Live Births              Family History  Problem Relation Age of Onset   Hypertension Mother    Diabetes Mother    Hypercholesterolemia Mother    Hypertension Sister    CVA Maternal Grandmother    Hypertension Maternal Grandmother    Diabetes Maternal Grandmother    Heart attack Neg Hx     Social History   Tobacco Use   Smoking status: Never   Smokeless tobacco: Never  Vaping Use   Vaping Use: Never used  Substance Use Topics   Alcohol use: No   Drug use: No    Home Medications Prior to Admission medications   Medication Sig Start Date End Date  Taking? Authorizing Provider  albuterol (VENTOLIN HFA) 108 (90 Base) MCG/ACT inhaler Inhale 2 puffs into the lungs every 4 (four) hours as needed for wheezing or shortness of breath (cough, shortness of breath or wheezing.). 06/12/20   Azzie Glatter, FNP  amLODipine (NORVASC) 5 MG tablet Take 1 tablet (5 mg total) by mouth daily. 02/17/21   Vevelyn Francois, NP  aspirin 81 MG EC tablet Take 1 tablet (81 mg total) by mouth daily. 10/04/19   Azzie Glatter, FNP  blood glucose meter kit and supplies KIT Dispense based on patient and insurance preference. Use up to four times daily as directed. (FOR ICD-9 250.00, 250.01). 10/03/19   Azzie Glatter, FNP  ferrous sulfate 325 (65 FE) MG tablet Take 1 tablet (325 mg total) by mouth 2 (two) times daily. 06/12/20   Azzie Glatter, FNP  glipiZIDE (GLUCOTROL) 10 MG tablet Take 1 tablet (10 mg total) by mouth 2 (two) times daily before a meal. 03/19/21   Vevelyn Francois, NP  glucose blood test strip USE AS DIRECTED UP TP 4 TIMES DAILY Patient taking differently: USE AS DIRECTED UP TP 4 TIMES DAILY 12/11/20 12/11/21  Azzie Glatter, FNP  hydrALAZINE (APRESOLINE) 25 MG tablet Take 1 tablet (25 mg total) by mouth every 8 (eight) hours. 06/12/20   Azzie Glatter, FNP  hydrochlorothiazide (HYDRODIURIL) 25 MG tablet Take 1 tablet by mouth once daily 09/10/20   Azzie Glatter, FNP  losartan (COZAAR) 100 MG tablet Take 0.5 tablets (50 mg total) by mouth daily. 01/13/21   Azzie Glatter, FNP  metFORMIN (GLUCOPHAGE) 1000 MG tablet Take 1 tablet (1,000 mg total) by mouth 2 (two) times daily with a meal. 06/12/20   Azzie Glatter, FNP  metoprolol succinate (TOPROL-XL) 100 MG 24 hr tablet Take 1 tablet (100 mg total) by mouth daily. Take with or immediately following a meal. 06/12/20 06/07/21  Azzie Glatter, FNP  nitroGLYCERIN (NITROSTAT) 0.4 MG SL tablet Place 1 tablet (0.4 mg total) under the tongue every 5 (five) minutes x 3 doses as needed for chest pain.  10/04/19   Azzie Glatter, FNP  Omega-3 Fatty Acids (FISH OIL) 1000 MG CAPS Take by mouth.     [provider]  [facility] VERIO test strip USE AS DIRECTED UP TP 4 TIMES DAILY 12/11/20   Azzie Glatter, FNP  spironolactone (ALDACTONE) 25 MG tablet Take 1 tablet by mouth once daily 09/10/20   Azzie Glatter, FNP  vitamin B-12 (CYANOCOBALAMIN) 100 MCG tablet Take 100 mcg by mouth daily.    [provider]  Vitamin D, Ergocalciferol, (DRISDOL) 1.25 MG (50000 UT) CAPS capsule Take  1 capsule (50,000 Units total) by mouth every 7 (seven) days. 10/04/19   Azzie Glatter, FNP    Allergies    Crestor [rosuvastatin], Hydrocodone, Other, Oxycodone, Pravachol [pravastatin sodium], and Simvastatin  Review of Systems   Review of Systems  Constitutional:  Positive for diaphoresis. Negative for fever.  HENT:  Negative for rhinorrhea.   Respiratory:  Positive for shortness of breath. Negative for cough.   Cardiovascular:  Negative for chest pain and palpitations.  Gastrointestinal:  Negative for abdominal pain, nausea and vomiting.  Skin:  Negative for rash.  Neurological:  Positive for dizziness. Negative for syncope.  Psychiatric/Behavioral:  Negative for confusion.    Physical Exam Updated Vital Signs BP (!) 121/96   Pulse (!) 102   Temp 97.9 F (36.6 C)   Resp 19   Ht 5' 8"  (1.727 m)   Wt 136.1 kg   LMP 05/09/2012   SpO2 92%   BMI 45.61 kg/m   Physical Exam Constitutional:      General: She is not in acute distress. HENT:     Head: Normocephalic and atraumatic.  Cardiovascular:     Rate and Rhythm: Tachycardia present.     Heart sounds: Normal heart sounds.  Pulmonary:     Effort: Pulmonary effort is normal.     Breath sounds: Normal breath sounds.  Abdominal:     Palpations: Abdomen is soft.     Tenderness: There is no abdominal tenderness.  Musculoskeletal:     Cervical back: Neck supple.  Skin:    General: Skin is warm.  Neurological:     General:  No focal deficit present.     Mental Status: She is alert. Mental status is at baseline.    ED Results / Procedures / Treatments   Labs (all labs ordered are listed, but only abnormal results are displayed) Labs Reviewed  BASIC METABOLIC PANEL - Abnormal; Notable for the following components:      Result Value   Sodium 133 (*)    Potassium 5.2 (*)    Chloride 97 (*)    Glucose, Bld 433 (*)    Creatinine, Ser 1.21 (*)    GFR, Estimated 54 (*)    All other components within normal limits  CBC WITH DIFFERENTIAL/PLATELET - Abnormal; Notable for the following components:   RBC 5.64 (*)    MCV 73.6 (*)    MCH 22.7 (*)    Platelets 455 (*)    All other components within normal limits  URINALYSIS, ROUTINE W REFLEX MICROSCOPIC - Abnormal; Notable for the following components:   APPearance HAZY (*)    Glucose, UA >=500 (*)    All other components within normal limits  OSMOLALITY - Abnormal; Notable for the following components:   Osmolality 302 (*)    All other components within normal limits  URINALYSIS, MICROSCOPIC (REFLEX) - Abnormal; Notable for the following components:   Bacteria, UA RARE (*)    All other components within normal limits  CBG MONITORING, ED - Abnormal; Notable for the following components:   Glucose-Capillary 430 (*)    All other components within normal limits  I-STAT VENOUS BLOOD GAS, ED - Abnormal; Notable for the following components:   pH, Ven 7.434 (*)    pO2, Ven 31.0 (*)    Bicarbonate 29.5 (*)    Acid-Base Excess 4.0 (*)    Potassium 5.2 (*)    Calcium, Ion 1.05 (*)    All other components within normal limits  BETA-HYDROXYBUTYRIC ACID  TROPONIN I (HIGH SENSITIVITY)  TROPONIN I (HIGH SENSITIVITY)    EKG EKG Interpretation  Date/Time:  Monday June 09 2021 11:06:21 EDT Ventricular Rate:  128 PR Interval:    QRS Duration: 105 QT Interval:  340 QTC Calculation: 497 R Axis:   110 Text Interpretation: Junctional tachycardia Anteroseptal  infarct, age indeterminate Confirmed by Blanchie Dessert ([id number]) on 06/09/2021 11:26:49 AM  Radiology DG Chest Port 1 View  Result Date: 06/09/2021 CLINICAL DATA:  Shortness of breath. EXAM: PORTABLE CHEST 1 VIEW COMPARISON:  April 24, 2018. FINDINGS: The heart size and mediastinal contours are within normal limits. Both lungs are clear. The visualized skeletal structures are unremarkable. IMPRESSION: No active disease. Electronically Signed   By: Marijo Conception M.D.   On: 06/09/2021 14:12    Procedures Procedures   Medications Ordered in ED Medications  metoprolol tartrate (LOPRESSOR) injection 5 mg (5 mg Intravenous Given 06/09/21 1053)  lactated ringers bolus 1,000 mL (0 mLs Intravenous Stopped 06/09/21 1223)    ED Course  I have reviewed the triage vital signs and the nursing notes.  Pertinent labs & imaging results that were available during my care of the patient were reviewed by me and considered in my medical decision making (see chart for details).    MDM Rules/Calculators/A&P                         This is a 55 year old female with hx of DM2, HTN, HLD, and prior NSTEMI presenting with dizziness.  Patient attributed her symptoms to hyperglycemia- sugar 558 at home- after eating [employer] and sweet tea last night. However, EKG on arrival shows patient in SVT. Attempted valsalva at bedside x2 with no change.  Appears to be SVT, however with rate only in the 150s could also consider a-flutter or sinus arrhythmia. Will give IV Metoprolol 23m x1 and 1L LR bolus.   Patient converted to sinus rhythm with HR in the 70s after 537mIV metoprolol x1. Workup for her hyperglycemia does not show any evidence of DKA- pH 7.4, bicarb 29.5 on VBG, BHB of 0.15. Troponin and CXR unremarkable.  Stable for discharge home with outpatient cardiology follow up. Advised her to stop taking Benadryl and recommend close PCP follow up for better diabetic control.   Final Clinical Impression(s) / ED  Diagnoses Final diagnoses:  Type 2 diabetes mellitus with hyperglycemia, unspecified whether long term insulin use (HCTreynor SVT (supraventricular tachycardia) ([place]   Rx / DC Orders ED Discharge Orders     None      AsAlcus DadMD PGY-2 [facility]   WeAlcus DadMD 06/09/21 [email or web address] [email or web address] 06/10/21 [phone]

## 2021-06-27 ENCOUNTER — Ambulatory Visit: Payer: Self-pay | Admitting: Nurse Practitioner

## 2021-07-01 ENCOUNTER — Other Ambulatory Visit: Payer: Self-pay

## 2021-07-01 DIAGNOSIS — D649 Anemia, unspecified: Secondary | ICD-10-CM

## 2021-07-01 DIAGNOSIS — I10 Essential (primary) hypertension: Secondary | ICD-10-CM

## 2021-07-01 DIAGNOSIS — E1159 Type 2 diabetes mellitus with other circulatory complications: Secondary | ICD-10-CM

## 2021-07-01 MED ORDER — HYDROCHLOROTHIAZIDE 25 MG PO TABS: 25.0000 mg | ORAL_TABLET | Freq: Every day | ORAL | 3 refills | Status: DC

## 2021-07-01 MED ORDER — FERROUS SULFATE 325 (65 FE) MG PO TABS
325.0000 mg | ORAL_TABLET | Freq: Two times a day (BID) | ORAL | 6 refills | Status: DC
Start: 2021-07-01 — End: 2022-07-01

## 2021-07-01 MED ORDER — GLIPIZIDE 10 MG PO TABS: 10.0000 mg | ORAL_TABLET | Freq: Two times a day (BID) | ORAL | 6 refills | Status: DC

## 2021-07-01 MED ORDER — LOSARTAN POTASSIUM 100 MG PO TABS
50.0000 mg | ORAL_TABLET | Freq: Every day | ORAL | 1 refills | Status: DC
Start: 2021-07-01 — End: 2021-08-25

## 2021-07-01 MED ORDER — SPIRONOLACTONE 25 MG PO TABS: 25.0000 mg | ORAL_TABLET | Freq: Every day | ORAL | 3 refills | Status: DC

## 2021-07-01 MED ORDER — HYDRALAZINE HCL 25 MG PO TABS: 25.0000 mg | ORAL_TABLET | Freq: Three times a day (TID) | ORAL | 3 refills | Status: DC

## 2021-07-01 NOTE — Addendum Note (Signed)
Addended by: Thressa Sheller on: 07/01/2021 04:10 PM   Modules accepted: Orders

## 2021-07-02 MED ORDER — METFORMIN HCL 1000 MG PO TABS: 1000.0000 mg | ORAL_TABLET | Freq: Two times a day (BID) | ORAL | 3 refills | Status: DC

## 2021-07-02 NOTE — Addendum Note (Signed)
Addended by: Thressa Sheller on: 07/02/2021 09:54 AM   Modules accepted: Orders

## 2021-07-17 ENCOUNTER — Emergency Department (HOSPITAL_COMMUNITY)
Admission: EM | Admit: 2021-07-17 | Discharge: 2021-07-17 | Disposition: A | Discharge status: Home / Self Care | Payer: 59 | Attending: Emergency Medicine | Admitting: Emergency Medicine

## 2021-07-17 ENCOUNTER — Encounter (HOSPITAL_COMMUNITY): Payer: Self-pay

## 2021-07-17 ENCOUNTER — Other Ambulatory Visit: Payer: Self-pay

## 2021-07-17 DIAGNOSIS — I10 Essential (primary) hypertension: Secondary | ICD-10-CM | POA: Insufficient documentation

## 2021-07-17 DIAGNOSIS — Z20822 Contact with and (suspected) exposure to covid-19: Secondary | ICD-10-CM | POA: Diagnosis not present

## 2021-07-17 DIAGNOSIS — R059 Cough, unspecified: Secondary | ICD-10-CM

## 2021-07-17 DIAGNOSIS — R0981 Nasal congestion: Secondary | ICD-10-CM | POA: Insufficient documentation

## 2021-07-17 LAB — RESP PANEL BY RT-PCR (FLU A&B, COVID) ARPGX2
Influenza A by PCR: NEGATIVE
Influenza B by PCR: NEGATIVE
SARS Coronavirus 2 by RT PCR: NEGATIVE

## 2021-07-17 MED ORDER — BENZONATATE 100 MG PO CAPS: 100.0000 mg | ORAL_CAPSULE | Freq: Three times a day (TID) | ORAL | 0 refills | Status: DC

## 2021-07-17 NOTE — ED Triage Notes (Signed)
Pt presents with c/o cough for approx 2 weeks. Pt reports she has had this cough in the past. Pt denies any known sick contacts.

## 2021-07-17 NOTE — ED Provider Notes (Signed)
[facility] DEPT Provider Note   CSN: [id number] Arrival date & time: 07/17/21  0846     History Chief Complaint  Patient presents with   Cough    Hiliana Eilts is a 55 y.o. female.  55 year old female presents today for evaluation of nonproductive cough of 2-week duration.  Patient reports at onset of her symptoms she had associated sinus congestion, postnasal drainage, and productive cough.  Over the past week to week and a half all of her other symptoms have resolved and a dry cough has persisted.  She reports its disruptive to her daily activities.  She denies fever, chills, dyspnea, abdominal pain.  She reports she has been around other people at work who also have similar symptoms.  The history is provided by the patient. No language interpreter was used.      Past Medical History:  Diagnosis Date   Diabetes mellitus    Fibroids    Headache(784.0)    History of echocardiogram    Echo 7/18: Moderate concentric LVH, EF 55-60, normal wall motion, grade 1 diastolic dysfunction, calcified aortic leaflets   Hypercholesterolemia    Hyperlipidemia    Hypertension    Osteoarthritis    , with mild meniscus tear, followed by orthopedics--Dr Gladstone Lighter 04/29/10   Right knee pain    Seizures ([place])    as child   Vitamin D deficiency     Patient Active Problem List   Diagnosis Date Noted   Hemoglobin A1c greater than 9.0% 10/04/2019   Weight loss 10/04/2019   Hx of non-ST elevation myocardial infarction (NSTEMI) 06/16/2019   Class 3 severe obesity due to excess calories with serious comorbidity and body mass index (BMI) of 45.0 to 49.9 in adult ([place]) 06/16/2019   Hyperglycemia 06/16/2019   Hypertensive heart disease with systolic congestive heart failure (HCC)    NSTEMI (non-ST elevated myocardial infarction) ([place]) 04/24/2018   ACS (acute coronary syndrome) ([place]) 04/24/2018   Chronic pain of left knee 07/15/2017   Chronic pain of right knee  07/15/2017   Unilateral primary osteoarthritis, left knee 07/15/2017   Unilateral primary osteoarthritis, right knee 07/15/2017   Chest pain    Benign essential HTN    Cardiomyopathy ([place]) 03/16/2014   Radiculopathy 09/07/2013   Hypokalemia 06/02/2012   HTN (hypertension) 06/01/2012   DM (diabetes mellitus) ([place]) 06/01/2012   Hyperlipidemia 06/01/2012   Dyspnea on exertion 06/01/2012   Morbid obesity ([place]) 06/01/2012    Past Surgical History:  Procedure Laterality Date   ANTERIOR CERVICAL DECOMP/DISCECTOMY FUSION N/A 09/07/2013   Procedure: ANTERIOR CERVICAL DECOMPRESSION/DISCECTOMY FUSION 1 LEVEL;  Surgeon: Sinclair Ship, MD;  Location: [facility];  Service: Orthopedics;  Laterality: N/A;  Anterior cervical decompression fusion, cervical 5-6 with instrumentation and allograft   LAPAROSCOPIC ASSISTED VAGINAL HYSTERECTOMY  1/14   LEFT HEART CATH AND CORONARY ANGIOGRAPHY N/A 04/25/2018   Procedure: LEFT HEART CATH AND CORONARY ANGIOGRAPHY;  Surgeon: Lorretta Harp, MD;  Location: [facility];  Service: Cardiovascular;  Laterality: N/A;   SHOULDER SURGERY Right 11   rotator cuff   TUBAL LIGATION     UMBILICAL HERNIA REPAIR       OB History     Gravida  4   Para  4   Term  3   Preterm  1   AB      Living  3      SAB      IAB      Ectopic  Multiple      Live Births              Family History  Problem Relation Age of Onset   Hypertension Mother    Diabetes Mother    Hypercholesterolemia Mother    Hypertension Sister    CVA Maternal Grandmother    Hypertension Maternal Grandmother    Diabetes Maternal Grandmother    Heart attack Neg Hx     Social History   Tobacco Use   Smoking status: Never   Smokeless tobacco: Never  Vaping Use   Vaping Use: Never used  Substance Use Topics   Alcohol use: No   Drug use: No    Home Medications Prior to Admission medications   Medication Sig Start Date End Date Taking? Authorizing Provider   albuterol (VENTOLIN HFA) 108 (90 Base) MCG/ACT inhaler Inhale 2 puffs into the lungs every 4 (four) hours as needed for wheezing or shortness of breath (cough, shortness of breath or wheezing.). 06/12/20   Azzie Glatter, FNP  amLODipine (NORVASC) 5 MG tablet Take 1 tablet (5 mg total) by mouth daily. 02/17/21   Vevelyn Francois, NP  aspirin 81 MG EC tablet Take 1 tablet (81 mg total) by mouth daily. 10/04/19   Azzie Glatter, FNP  blood glucose meter kit and supplies KIT Dispense based on patient and insurance preference. Use up to four times daily as directed. (FOR ICD-9 250.00, 250.01). 10/03/19   Azzie Glatter, FNP  ferrous sulfate 325 (65 FE) MG tablet Take 1 tablet (325 mg total) by mouth 2 (two) times daily. 07/01/21   Vevelyn Francois, NP  glipiZIDE (GLUCOTROL) 10 MG tablet Take 1 tablet (10 mg total) by mouth 2 (two) times daily before a meal. 07/01/21   King, Diona Foley, NP  glucose blood test strip USE AS DIRECTED UP TP 4 TIMES DAILY Patient taking differently: USE AS DIRECTED UP TP 4 TIMES DAILY 12/11/20 12/11/21  Azzie Glatter, FNP  hydrALAZINE (APRESOLINE) 25 MG tablet Take 1 tablet (25 mg total) by mouth every 8 (eight) hours. 07/01/21   Vevelyn Francois, NP  hydrochlorothiazide (HYDRODIURIL) 25 MG tablet Take 1 tablet (25 mg total) by mouth daily. 07/01/21   Vevelyn Francois, NP  losartan (COZAAR) 100 MG tablet Take 0.5 tablets (50 mg total) by mouth daily. 07/01/21   Vevelyn Francois, NP  metFORMIN (GLUCOPHAGE) 1000 MG tablet Take 1 tablet (1,000 mg total) by mouth 2 (two) times daily with a meal. 07/02/21   Vevelyn Francois, NP  metoprolol succinate (TOPROL-XL) 100 MG 24 hr tablet Take 1 tablet (100 mg total) by mouth daily. Take with or immediately following a meal. 06/12/20 06/07/21  Azzie Glatter, FNP  nitroGLYCERIN (NITROSTAT) 0.4 MG SL tablet Place 1 tablet (0.4 mg total) under the tongue every 5 (five) minutes x 3 doses as needed for chest pain. 10/04/19   Azzie Glatter, FNP   Omega-3 Fatty Acids (FISH OIL) 1000 MG CAPS Take by mouth.     [provider]  [facility] VERIO test strip USE AS DIRECTED UP TP 4 TIMES DAILY 12/11/20   Azzie Glatter, FNP  spironolactone (ALDACTONE) 25 MG tablet Take 1 tablet (25 mg total) by mouth daily. 07/01/21   Vevelyn Francois, NP  vitamin B-12 (CYANOCOBALAMIN) 100 MCG tablet Take 100 mcg by mouth daily.    [provider]  Vitamin D, Ergocalciferol, (DRISDOL) 1.25 MG (50000 UT) CAPS capsule Take 1 capsule (  50,000 Units total) by mouth every 7 (seven) days. 10/04/19   Azzie Glatter, FNP    Allergies    Crestor [rosuvastatin], Hydrocodone, Other, Oxycodone, Pravachol [pravastatin sodium], and Simvastatin  Review of Systems   Review of Systems  Constitutional:  Negative for chills and fever.  HENT:  Negative for congestion, postnasal drip, rhinorrhea and sore throat.   Respiratory:  Negative for shortness of breath.   Cardiovascular:  Negative for chest pain and palpitations.  Gastrointestinal:  Negative for abdominal pain and nausea.  Neurological:  Negative for light-headedness.  All other systems reviewed and are negative.  Physical Exam Updated Vital Signs BP (!) 147/98 (BP Location: Right Arm)   Pulse 94   Temp 97.9 F (36.6 C) (Oral)   Resp 18   LMP 05/09/2012   SpO2 94%   Physical Exam Vitals and nursing note reviewed.  Constitutional:      General: She is not in acute distress.    Appearance: Normal appearance. She is not ill-appearing.  HENT:     Head: Normocephalic and atraumatic.     Nose: Nose normal.  Eyes:     General: No scleral icterus.    Extraocular Movements: Extraocular movements intact.     Conjunctiva/sclera: Conjunctivae normal.  Cardiovascular:     Rate and Rhythm: Normal rate and regular rhythm.     Pulses: Normal pulses.     Heart sounds: Normal heart sounds.  Pulmonary:     Effort: Pulmonary effort is normal. No respiratory distress.     Breath sounds: Normal  breath sounds. No wheezing or rales.  Abdominal:     General: There is no distension.     Tenderness: There is no abdominal tenderness.  Musculoskeletal:        General: Normal range of motion.     Cervical back: Normal range of motion.  Skin:    General: Skin is warm and dry.  Neurological:     General: No focal deficit present.     Mental Status: She is alert. Mental status is at baseline.    ED Results / Procedures / Treatments   Labs (all labs ordered are listed, but only abnormal results are displayed) Labs Reviewed  RESP PANEL BY RT-PCR (FLU A&B, COVID) [id number]    EKG None  Radiology No results found.  Procedures Procedures   Medications Ordered in ED Medications - No data to display  ED Course  I have reviewed the triage vital signs and the nursing notes.  Pertinent labs & imaging results that were available during my care of the patient were reviewed by me and considered in my medical decision making (see chart for details).    MDM Rules/Calculators/A&P                           55 year old female presents today for evaluation of nonproductive persistent cough without associated symptoms.  Low concern for PE.  On my exam patient's heart rate is about 90 and regular.  Wells criteria is 0 for PE.  Given URI symptoms at the beginning of onset this is likely postinfectious cough.  She is not on an ACE inhibitor. her COVID, flu, RSV panel was negative.  Supportive care discussed.  Tessalon Perles sent in for symptomatic treatment.  Discussed follow-up with cough if this is persistent.  Return precautions to the emergency room discussed.  Patient voices understanding and is in agreement with plan.  Final Clinical Impression(s) /  ED Diagnoses Final diagnoses:  None    Rx / DC Orders ED Discharge Orders     None        Ali, Amjad, PA-C 07/17/21 1034    Tegeler, Christopher J, MD 07/17/21 1633  

## 2021-07-17 NOTE — Discharge Instructions (Signed)
Your flu, COVID, RSV panel was negative.  You likely have a post infectious cough.  I have sent Tessalon Perles into your pharmacy.  If you develop shortness of breath, fever, chest pain please return to the emergency room.  Follow-up with your primary care provider.

## 2021-08-25 ENCOUNTER — Encounter: Payer: Self-pay | Admitting: Nurse Practitioner

## 2021-08-25 ENCOUNTER — Other Ambulatory Visit: Payer: Self-pay

## 2021-08-25 ENCOUNTER — Ambulatory Visit (INDEPENDENT_AMBULATORY_CARE_PROVIDER_SITE_OTHER): Payer: Self-pay | Admitting: Nurse Practitioner

## 2021-08-25 VITALS — BP 154/83 | HR 93 | Temp 97.2°F | Ht 68.0 in | Wt 304.2 lb

## 2021-08-25 DIAGNOSIS — E1159 Type 2 diabetes mellitus with other circulatory complications: Secondary | ICD-10-CM

## 2021-08-25 DIAGNOSIS — E1151 Type 2 diabetes mellitus with diabetic peripheral angiopathy without gangrene: Secondary | ICD-10-CM

## 2021-08-25 DIAGNOSIS — Z1329 Encounter for screening for other suspected endocrine disorder: Secondary | ICD-10-CM

## 2021-08-25 DIAGNOSIS — I249 Acute ischemic heart disease, unspecified: Secondary | ICD-10-CM

## 2021-08-25 DIAGNOSIS — M549 Dorsalgia, unspecified: Secondary | ICD-10-CM

## 2021-08-25 DIAGNOSIS — D649 Anemia, unspecified: Secondary | ICD-10-CM

## 2021-08-25 DIAGNOSIS — N62 Hypertrophy of breast: Secondary | ICD-10-CM

## 2021-08-25 DIAGNOSIS — G8929 Other chronic pain: Secondary | ICD-10-CM

## 2021-08-25 DIAGNOSIS — Z794 Long term (current) use of insulin: Secondary | ICD-10-CM

## 2021-08-25 DIAGNOSIS — I10 Essential (primary) hypertension: Secondary | ICD-10-CM

## 2021-08-25 LAB — POCT GLYCOSYLATED HEMOGLOBIN (HGB A1C)
HbA1c POC (<> result, manual entry): 11.9 % (ref 4.0–5.6)
HbA1c, POC (controlled diabetic range): 11.9 % — AB (ref 0.0–7.0)
HbA1c, POC (prediabetic range): 11.9 % — AB (ref 5.7–6.4)
Hemoglobin A1C: 11.9 % — AB (ref 4.0–5.6)

## 2021-08-25 MED ORDER — SPIRONOLACTONE 25 MG PO TABS: 25.0000 mg | ORAL_TABLET | Freq: Every day | ORAL | 3 refills | Status: DC

## 2021-08-25 MED ORDER — GLIPIZIDE 10 MG PO TABS: 10.0000 mg | ORAL_TABLET | Freq: Two times a day (BID) | ORAL | 3 refills | Status: DC

## 2021-08-25 MED ORDER — HYDROCHLOROTHIAZIDE 25 MG PO TABS: 25.0000 mg | ORAL_TABLET | Freq: Every day | ORAL | 3 refills | Status: DC

## 2021-08-25 MED ORDER — LOSARTAN POTASSIUM 100 MG PO TABS: 50.0000 mg | ORAL_TABLET | Freq: Every day | ORAL | 3 refills | Status: DC

## 2021-08-25 MED ORDER — HYDRALAZINE HCL 25 MG PO TABS: 25.0000 mg | ORAL_TABLET | Freq: Three times a day (TID) | ORAL | 3 refills | Status: DC

## 2021-08-25 MED ORDER — AMLODIPINE BESYLATE 5 MG PO TABS: 5.0000 mg | ORAL_TABLET | Freq: Every day | ORAL | 3 refills | Status: DC

## 2021-08-25 MED ORDER — ONETOUCH VERIO VI STRP
1.0000 | ORAL_STRIP | Freq: Four times a day (QID) | 3 refills | Status: DC
  Filled 2021-08-25: qty 100, 25d supply, fill #0
  Filled 2021-09-29 – 2021-09-30 (×2): qty 360, 90d supply, fill #0
  Filled 2021-10-04: qty 100, 25d supply, fill #0
  Filled 2021-11-24 – 2021-12-02 (×2): qty 100, 25d supply, fill #1
  Filled 2022-03-17: qty 100, 25d supply, fill #2
  Filled 2022-05-22: qty 100, 25d supply, fill #3

## 2021-08-25 MED ORDER — METFORMIN HCL 1000 MG PO TABS: 1000.0000 mg | ORAL_TABLET | Freq: Two times a day (BID) | ORAL | 3 refills | Status: DC

## 2021-08-25 NOTE — Patient Instructions (Addendum)
Diabetes Mellitus and Nutrition, Adult ?When you have diabetes, or diabetes mellitus, it is very important to have healthy eating habits because your blood sugar (glucose) levels are greatly affected by what you eat and drink. Eating healthy foods in the right amounts, at about the same times every day, can help you: ?Manage your blood glucose. ?Lower your risk of heart disease. ?Improve your blood pressure. ?Reach or maintain a healthy weight. ?What can affect my meal plan? ?Every person with diabetes is different, and each person has different needs for a meal plan. Your health care provider may recommend that you work with a dietitian to make a meal plan that is best for you. Your meal plan may vary depending on factors such as: ?The calories you need. ?The medicines you take. ?Your weight. ?Your blood glucose, blood pressure, and cholesterol levels. ?Your activity level. ?Other health conditions you have, such as heart or kidney disease. ?How do carbohydrates affect me? ?Carbohydrates, also called carbs, affect your blood glucose level more than any other type of food. Eating carbs raises the amount of glucose in your blood. ?It is important to know how many carbs you can safely have in each meal. This is different for every person. Your dietitian can help you calculate how many carbs you should have at each meal and for each snack. ?How does alcohol affect me? ?Alcohol can cause a decrease in blood glucose (hypoglycemia), especially if you use insulin or take certain diabetes medicines by mouth. Hypoglycemia can be a life-threatening condition. Symptoms of hypoglycemia, such as sleepiness, dizziness, and confusion, are similar to symptoms of having too much alcohol. ?Do not drink alcohol if: ?Your health care provider tells you not to drink. ?You are pregnant, may be pregnant, or are planning to become pregnant. ?If you drink alcohol: ?Limit how much you have to: ?0-1 drink a day for women. ?0-2 drinks a day  for men. ?Know how much alcohol is in your drink. In the [place] one drink equals one 12 oz bottle of beer (355 mL), one 5 oz glass of wine (148 mL), or one 1? oz glass of hard liquor (44 mL). ?Keep yourself hydrated with water, diet soda, or unsweetened iced tea. Keep in mind that regular soda, juice, and other mixers may contain a lot of sugar and must be counted as carbs. ?What are tips for following this plan? ?Reading food labels ?Start by checking the serving size on the Nutrition Facts label of packaged foods and drinks. The number of calories and the amount of carbs, fats, and other nutrients listed on the label are based on one serving of the item. Many items contain more than one serving per package. ?Check the total grams (g) of carbs in one serving. ?Check the number of grams of saturated fats and trans fats in one serving. Choose foods that have a low amount or none of these fats. ?Check the number of milligrams (mg) of salt (sodium) in one serving. Most people should limit total sodium intake to less than 2,300 mg per day. ?Always check the nutrition information of foods labeled as "low-fat" or "nonfat." These foods may be higher in added sugar or refined carbs and should be avoided. ?Talk to your dietitian to identify your daily goals for nutrients listed on the label. ?Shopping ?Avoid buying canned, pre-made, or processed foods. These foods tend to be high in fat, sodium, and added sugar. ?Shop around the outside edge of the grocery store. This is where you will   most often find fresh fruits and vegetables, bulk grains, fresh meats, and fresh dairy products. Cooking Use low-heat cooking methods, such as baking, instead of high-heat cooking methods, such as deep frying. Cook using healthy oils, such as olive, canola, or sunflower oil. Avoid cooking with butter, cream, or high-fat meats. Meal planning Eat meals and snacks regularly, preferably at the same times every day. Avoid going long periods of  time without eating. Eat foods that are high in fiber, such as fresh fruits, vegetables, beans, and whole grains. Eat 4-6 oz (112-168 g) of lean protein each day, such as lean meat, chicken, fish, eggs, or tofu. One ounce (oz) (28 g) of lean protein is equal to: 1 oz (28 g) of meat, chicken, or fish. 1 egg.  cup (62 g) of tofu. Eat some foods each day that contain healthy fats, such as avocado, nuts, seeds, and fish. What foods should I eat? Fruits Berries. Apples. Oranges. Peaches. Apricots. Plums. Grapes. Mangoes. Papayas. Pomegranates. Kiwi. Cherries. Vegetables Leafy greens, including lettuce, spinach, kale, chard, collard greens, mustard greens, and cabbage. Beets. Cauliflower. Broccoli. Carrots. Green beans. Tomatoes. Peppers. Onions. Cucumbers. Brussels sprouts. Grains Whole grains, such as whole-wheat or whole-grain bread, crackers, tortillas, cereal, and pasta. Unsweetened oatmeal. Quinoa. Brown or wild rice. Meats and other proteins Seafood. Poultry without skin. Lean cuts of poultry and beef. Tofu. Nuts. Seeds. Dairy Low-fat or fat-free dairy products such as milk, yogurt, and cheese. The items listed above may not be a complete list of foods and beverages you can eat and drink. Contact a dietitian for more information. What foods should I avoid? Fruits Fruits canned with syrup. Vegetables Canned vegetables. Frozen vegetables with butter or cream sauce. Grains Refined white flour and flour products such as bread, pasta, snack foods, and cereals. Avoid all processed foods. Meats and other proteins Fatty cuts of meat. Poultry with skin. Breaded or fried meats. Processed meat. Avoid saturated fats. Dairy Full-fat yogurt, cheese, or milk. Beverages Sweetened drinks, such as soda or iced tea. The items listed above may not be a complete list of foods and beverages you should avoid. Contact a dietitian for more information. Questions to ask a health care provider Do I need to  meet with a certified diabetes care and education specialist? Do I need to meet with a dietitian? What number can I call if I have questions? When are the best times to check my blood glucose? Where to find more information: American Diabetes Association: diabetes.org Academy of Nutrition and Dietetics: eatright.[employer] of Diabetes and Digestive and Kidney Diseases: [employer] Association of Diabetes Care & Education Specialists: diabeteseducator.org Summary It is important to have healthy eating habits because your blood sugar (glucose) levels are greatly affected by what you eat and drink. It is important to use alcohol carefully. A healthy meal plan will help you manage your blood glucose and lower your risk of heart disease. Your health care provider may recommend that you work with a dietitian to make a meal plan that is best for you. This information is not intended to replace advice given to you by your health care provider. Make sure you discuss any questions you have with your health care provider. Document Revised: 04/17/2020 Document Reviewed: 04/17/2020 Elsevier Patient Education  2022 [place].   Hypertension, Adult Hypertension is another name for high blood pressure. High blood pressure forces your heart to work harder to pump blood. This can cause problems over time. There are two numbers in a blood  pressure reading. There is a top number (systolic) over a bottom number (diastolic). It is best to have a blood pressure that is below 120/80. Healthy choices can help lower your blood pressure, or you may need medicine to help lower it. What are the causes? The cause of this condition is not known. Some conditions may be related to high blood pressure. What increases the risk? Smoking. Having type 2 diabetes mellitus, high cholesterol, or both. Not getting enough exercise or physical activity. Being overweight. Having too much fat, sugar, calories, or  salt (sodium) in your diet. Drinking too much alcohol. Having long-term (chronic) kidney disease. Having a family history of high blood pressure. Age. Risk increases with age. Race. You may be at higher risk if you are African American. Gender. Men are at higher risk than women before age 34. After age 31, women are at higher risk than men. Having obstructive sleep apnea. Stress. What are the signs or symptoms? High blood pressure may not cause symptoms. Very high blood pressure (hypertensive crisis) may cause: Headache. Feelings of worry or nervousness (anxiety). Shortness of breath. Nosebleed. A feeling of being sick to your stomach (nausea). Throwing up (vomiting). Changes in how you see. Very bad chest pain. Seizures. How is this treated? This condition is treated by making healthy lifestyle changes, such as: Eating healthy foods. Exercising more. Drinking less alcohol. Your health care provider may prescribe medicine if lifestyle changes are not enough to get your blood pressure under control, and if: Your top number is above 130. Your bottom number is above 80. Your personal target blood pressure may vary. Follow these instructions at home: Eating and drinking  If told, follow the DASH eating plan. To follow this plan: Fill one half of your plate at each meal with fruits and vegetables. Fill one fourth of your plate at each meal with whole grains. Whole grains include whole-wheat pasta, brown rice, and whole-grain bread. Eat or drink low-fat dairy products, such as skim milk or low-fat yogurt. Fill one fourth of your plate at each meal with low-fat (lean) proteins. Low-fat proteins include fish, chicken without skin, eggs, beans, and tofu. Avoid fatty meat, cured and processed meat, or chicken with skin. Avoid pre-made or processed food. Eat less than 1,500 mg of salt each day. Do not drink alcohol if: Your doctor tells you not to drink. You are pregnant, may be  pregnant, or are planning to become pregnant. If you drink alcohol: Limit how much you use to: 0-1 drink a day for women. 0-2 drinks a day for men. Be aware of how much alcohol is in your drink. In the [place] one drink equals one 12 oz bottle of beer (355 mL), one 5 oz glass of wine (148 mL), or one 1 oz glass of hard liquor (44 mL). Lifestyle  Work with your doctor to stay at a healthy weight or to lose weight. Ask your doctor what the best weight is for you. Get at least 30 minutes of exercise most days of the week. This may include walking, swimming, or biking. Get at least 30 minutes of exercise that strengthens your muscles (resistance exercise) at least 3 days a week. This may include lifting weights or doing Pilates. Do not use any products that contain nicotine or tobacco, such as cigarettes, e-cigarettes, and chewing tobacco. If you need help quitting, ask your doctor. Check your blood pressure at home as told by your doctor. Keep all follow-up visits as told by your doctor.  This is important. Medicines Take over-the-counter and prescription medicines only as told by your doctor. Follow directions carefully. Do not skip doses of blood pressure medicine. The medicine does not work as well if you skip doses. Skipping doses also puts you at risk for problems. Ask your doctor about side effects or reactions to medicines that you should watch for. Contact a doctor if you: Think you are having a reaction to the medicine you are taking. Have headaches that keep coming back (recurring). Feel dizzy. Have swelling in your ankles. Have trouble with your vision. Get help right away if you: Get a very bad headache. Start to feel mixed up (confused). Feel weak or numb. Feel faint. Have very bad pain in your: Chest. Belly (abdomen). Throw up more than once. Have trouble breathing. Summary Hypertension is another name for high blood pressure. High blood pressure forces your heart to work  harder to pump blood. For most people, a normal blood pressure is less than 120/80. Making healthy choices can help lower blood pressure. If your blood pressure does not get lower with healthy choices, you may need to take medicine. This information is not intended to replace advice given to you by your health care provider. Make sure you discuss any questions you have with your health care provider. Document Revised: 05/25/2018 Document Reviewed: 05/25/2018 Elsevier Patient Education  [place].

## 2021-08-25 NOTE — Progress Notes (Signed)
[place], [place]  [address] Phone:  [phone]   Fax:  [phone]   Established Patient Office Visit  Subjective:  Patient ID: Claudia [name], female    DOB: 1966/03/05  Age: 55 y.o. MRN: [id number]  CC:  Chief Complaint  Patient presents with   Follow-up    Pt is here today for her follow up and medication refills. Pt would like to talk about weight loss.     HPI Claudia [name] presents for 6 month follow up. A former patient of NP Stroud. She  has a past medical history of Diabetes mellitus, Fibroids, Headache(784.0), History of echocardiogram, Hypercholesterolemia, Hyperlipidemia, Hypertension, Osteoarthritis, Right knee pain, Seizures ([place]), and Vitamin D deficiency.   She is following up today for diabetes. She is monitoring her CBG morning in 260-280; 3-4 hours 160 then with meals it high. She has been diagnosed with diabetes for 20 years.  She is currently prescribed metformin 1000 mg twice daily and glipizide 10 mg twice daily.  Her current A1c is 11.9%.  This is up from 9.1% 7 months ago.  She is not interested in any medication adjustments at this time.  She reports that she will make the necessary changes to get the A1c under control.  She is also following up for hypertension.  She reports no longer the metoprolol for the last few weeks due to dizziness.  She is prescribed amlodipine 5 mg, hydralazine 25 mg 3 times daily, hydrochlorothiazide 25 mg daily, losartan 50 mg daily and spironolactone 25 mg daily. Denies headache, dizziness, visual changes, shortness of breath, dyspnea on exertion, chest pain, nausea, vomiting or any edema.   She has increased upper back pain due to to her large breast. She also has bilateral indention in her shoulders 0.5 to 1 inch from her bra straps She wears a 40 DDD size bra. She is unable to wear criss cross bras due to her neck surgery; ACDF.  She is interested in a breast reduction.  She reports  this was discussed some time however she is not in any additional follow-up.  Past Medical History:  Diagnosis Date   Diabetes mellitus    Fibroids    Headache(784.0)    History of echocardiogram    Echo 7/18: Moderate concentric LVH, EF 55-60, normal wall motion, grade 1 diastolic dysfunction, calcified aortic leaflets   Hypercholesterolemia    Hyperlipidemia    Hypertension    Osteoarthritis    , with mild meniscus tear, followed by orthopedics--Dr Gladstone Lighter 04/29/10   Right knee pain    Seizures ([place])    as child   Vitamin D deficiency     Past Surgical History:  Procedure Laterality Date   ANTERIOR CERVICAL DECOMP/DISCECTOMY FUSION N/A 09/07/2013   Procedure: ANTERIOR CERVICAL DECOMPRESSION/DISCECTOMY FUSION 1 LEVEL;  Surgeon: Sinclair Ship, MD;  Location: [facility];  Service: Orthopedics;  Laterality: N/A;  Anterior cervical decompression fusion, cervical 5-6 with instrumentation and allograft   LAPAROSCOPIC ASSISTED VAGINAL HYSTERECTOMY  1/14   LEFT HEART CATH AND CORONARY ANGIOGRAPHY N/A 04/25/2018   Procedure: LEFT HEART CATH AND CORONARY ANGIOGRAPHY;  Surgeon: Lorretta Harp, MD;  Location: [facility];  Service: Cardiovascular;  Laterality: N/A;   SHOULDER SURGERY Right 11   rotator cuff   TUBAL LIGATION     UMBILICAL HERNIA REPAIR      Family History  Problem Relation Age of Onset   Hypertension Mother    Diabetes  Mother    Hypercholesterolemia Mother    Hypertension Sister    CVA Maternal Grandmother    Hypertension Maternal Grandmother    Diabetes Maternal Grandmother    Heart attack Neg Hx     Social History   Socioeconomic History   Marital status: Married    Spouse name: Not on file   Number of children: Not on file   Years of education: Not on file   Highest education level: Not on file  Occupational History   Not on file  Tobacco Use   Smoking status: Never   Smokeless tobacco: Never  Vaping Use   Vaping Use: Never used  Substance  and Sexual Activity   Alcohol use: No   Drug use: No   Sexual activity: Yes  Other Topics Concern   Not on file  Social History Narrative   Not on file   Social Determinants of Health   Financial Resource Strain: Not on file  Food Insecurity: Not on file  Transportation Needs: Not on file  Physical Activity: Not on file  Stress: Not on file  Social Connections: Not on file  Intimate Partner Violence: Not on file    Outpatient Medications Prior to Visit  Medication Sig Dispense Refill   aspirin 81 MG EC tablet Take 1 tablet (81 mg total) by mouth daily. 30 tablet 6   blood glucose meter kit and supplies KIT Dispense based on patient and insurance preference. Use up to four times daily as directed. (FOR ICD-9 250.00, 250.01). 1 each 0   ferrous sulfate 325 (65 FE) MG tablet Take 1 tablet (325 mg total) by mouth 2 (two) times daily. 60 tablet 6   Omega-3 Fatty Acids (FISH OIL) 1000 MG CAPS Take by mouth.      vitamin B-12 (CYANOCOBALAMIN) 100 MCG tablet Take 100 mcg by mouth daily.     Vitamin D, Ergocalciferol, (DRISDOL) 1.25 MG (50000 UT) CAPS capsule Take 1 capsule (50,000 Units total) by mouth every 7 (seven) days. 5 capsule 6   amLODipine (NORVASC) 5 MG tablet Take 1 tablet (5 mg total) by mouth daily. 30 tablet 6   glipiZIDE (GLUCOTROL) 10 MG tablet Take 1 tablet (10 mg total) by mouth 2 (two) times daily before a meal. 60 tablet 6   glucose blood test strip USE AS DIRECTED UP TP 4 TIMES DAILY (Patient taking differently: USE AS DIRECTED UP TP 4 TIMES DAILY) 100 strip 0   hydrALAZINE (APRESOLINE) 25 MG tablet Take 1 tablet (25 mg total) by mouth every 8 (eight) hours. 270 tablet 3   hydrochlorothiazide (HYDRODIURIL) 25 MG tablet Take 1 tablet (25 mg total) by mouth daily. 30 tablet 3   losartan (COZAAR) 100 MG tablet Take 0.5 tablets (50 mg total) by mouth daily. 60 tablet 1   metFORMIN (GLUCOPHAGE) 1000 MG tablet Take 1 tablet (1,000 mg total) by mouth 2 (two) times daily with a  meal. 180 tablet 3   ONETOUCH VERIO test strip USE AS DIRECTED UP TP 4 TIMES DAILY 100 strip 0   spironolactone (ALDACTONE) 25 MG tablet Take 1 tablet (25 mg total) by mouth daily. 30 tablet 3   nitroGLYCERIN (NITROSTAT) 0.4 MG SL tablet Place 1 tablet (0.4 mg total) under the tongue every 5 (five) minutes x 3 doses as needed for chest pain. (Patient not taking: Reported on 08/25/2021) 100 tablet 6   albuterol (VENTOLIN HFA) 108 (90 Base) MCG/ACT inhaler Inhale 2 puffs into the lungs every 4 (four) hours  as needed for wheezing or shortness of breath (cough, shortness of breath or wheezing.). (Patient not taking: Reported on 08/25/2021) 8 g 11   benzonatate (TESSALON) 100 MG capsule Take 1 capsule (100 mg total) by mouth every 8 (eight) hours. (Patient not taking: Reported on 08/25/2021) 28 capsule 0   metoprolol succinate (TOPROL-XL) 100 MG 24 hr tablet Take 1 tablet (100 mg total) by mouth daily. Take with or immediately following a meal. 90 tablet 3   No facility-administered medications prior to visit.    Allergies  Allergen Reactions   Crestor [Rosuvastatin]     MUSCLE ACHES HEADACHES    Hydrocodone Itching   Other Hives, Swelling and Other (See Comments)    Vision changes; ALLERGIC TO ALL NUTS EXCEPT PEANUTS   Oxycodone Itching   Pravachol [Pravastatin Sodium] Itching    Joint pain   Simvastatin Hives    ROS Review of Systems  Musculoskeletal:        Left heel pain      Objective:    Physical Exam Constitutional:      General: She is not in acute distress.    Appearance: She is obese.  HENT:     Head: Normocephalic and atraumatic.     Nose: Nose normal.     Mouth/Throat:     Mouth: Mucous membranes are moist.  Cardiovascular:     Rate and Rhythm: Normal rate and regular rhythm.     Pulses: Normal pulses.     Heart sounds: Murmur heard.  Pulmonary:     Effort: Pulmonary effort is normal.     Breath sounds: Normal breath sounds.  Abdominal:     General: Bowel  sounds are normal.     Palpations: Abdomen is soft.     Comments: Increased abdominal girth   Musculoskeletal:        General: Normal range of motion.     Cervical back: Normal range of motion.  Skin:    General: Skin is warm and dry.     Capillary Refill: Capillary refill takes less than 2 seconds.  Neurological:     General: No focal deficit present.     Mental Status: She is alert and oriented to person, place, and time.  Psychiatric:        Mood and Affect: Mood normal.        Behavior: Behavior normal.   BP (!) 154/83   Pulse 93   Temp (!) 97.2 F (36.2 C)   Ht 5' 8"  (1.727 m)   Wt (!) 304 lb 3.2 oz (138 kg)   LMP 05/09/2012   SpO2 98%   BMI 46.25 kg/m  Wt Readings from Last 3 Encounters:  08/25/21 (!) 304 lb 3.2 oz (138 kg)  06/09/21 300 lb (136.1 kg)  12/30/20 291 lb (132 kg)     Health Maintenance Due  Topic Date Due   COVID-19 Vaccine (1) Never done   Hepatitis C Screening  Never done   COLONOSCOPY (Pts 45-70yr Insurance coverage will need to be confirmed)  Never done   PAP SMEAR-Modifier  06/17/2015   Zoster Vaccines- Shingrix (1 of 2) Never done   FOOT EXAM  02/25/2018   Pneumococcal Vaccine [id number] old (2 - PCV) 03/01/2018   OPHTHALMOLOGY EXAM  07/13/2018   INFLUENZA VACCINE  Never done    There are no preventive care reminders to display for this patient.  Lab Results  Component Value Date   TSH 0.746 10/04/2020   Lab Results  Component Value Date   WBC 6.9 06/09/2021   HGB 15.0 06/09/2021   HCT 44.0 06/09/2021   MCV 73.6 (L) 06/09/2021   PLT 455 (H) 06/09/2021   Lab Results  Component Value Date   NA 135 06/09/2021   K 5.2 (H) 06/09/2021   CO2 27 06/09/2021   GLUCOSE 433 (H) 06/09/2021   BUN 15 06/09/2021   CREATININE 1.21 (H) 06/09/2021   BILITOT 0.3 10/04/2020   ALKPHOS 76 10/04/2020   AST 24 10/04/2020   ALT 40 (H) 10/04/2020   PROT 8.4 10/04/2020   ALBUMIN 4.2 10/04/2020   CALCIUM 9.3 06/09/2021   ANIONGAP 9 06/09/2021    GFR 143.59 03/16/2014   Lab Results  Component Value Date   CHOL 224 (H) 10/04/2020   Lab Results  Component Value Date   HDL 31 (L) 10/04/2020   Lab Results  Component Value Date   LDLCALC 155 (H) 10/04/2020   Lab Results  Component Value Date   TRIG 207 (H) 10/04/2020   Lab Results  Component Value Date   CHOLHDL 7.2 (H) 10/04/2020   Lab Results  Component Value Date   HGBA1C 11.9 (A) 08/25/2021   HGBA1C 11.9 08/25/2021   HGBA1C 11.9 (A) 08/25/2021   HGBA1C 11.9 (A) 08/25/2021      Assessment & Plan:   Problem List Items Addressed This Visit       Cardiovascular and Mediastinum   ACS (acute coronary syndrome) ([place]) Stable Follow-up with cardiology as scheduled   Relevant Medications   amLODipine (NORVASC) 5 MG tablet   hydrALAZINE (APRESOLINE) 25 MG tablet   hydrochlorothiazide (HYDRODIURIL) 25 MG tablet   losartan (COZAAR) 100 MG tablet   spironolactone (ALDACTONE) 25 MG tablet     Endocrine   DM (diabetes mellitus) ([place]) - Primary Uncontrolled Encourage compliance with current treatment regimen  Dose adjustment declined. Lifestyle modification with healthy diet (fewer calories, more high fiber foods, whole grains and non-starchy vegetables, lower fat meat and fish, low-fat diary include healthy oils) regular exercise (physical activity) and weight loss Encourage regular CBG monitoring; test strips refilled Encourage contacting office if excessive hyperglycemia and or hypoglycemia    Relevant Medications   glipiZIDE (GLUCOTROL) 10 MG tablet   losartan (COZAAR) 100 MG tablet   metFORMIN (GLUCOPHAGE) 1000 MG tablet   Other Relevant Orders   HgB A1c (Completed)   Comp. Metabolic Panel (12)   Lipid panel   Other Visit Diagnoses     Essential hypertension     Persistent Self discontinued metoprolol Follow-up with cardiology as scheduled Encouraged on going compliance with current medication regimen Encouraged home monitoring and recording BP  <130/80 Eating a heart-healthy diet with less salt Encouraged regular physical activity  Recommend Weight loss     Relevant Medications   amLODipine (NORVASC) 5 MG tablet   hydrALAZINE (APRESOLINE) 25 MG tablet   hydrochlorothiazide (HYDRODIURIL) 25 MG tablet   losartan (COZAAR) 100 MG tablet   spironolactone (ALDACTONE) 25 MG tablet   Anemia, unspecified type       Large breasts     Breast reduction desire   Relevant Orders   Ambulatory referral to Plastic Surgery   Chronic upper back pain       Relevant Orders   Ambulatory referral to Plastic Surgery   Screening for thyroid disorder       Relevant Orders   TSH       Meds ordered this encounter  Medications   amLODipine (NORVASC) 5 MG tablet  Sig: Take 1 tablet (5 mg total) by mouth daily.    Dispense:  90 tablet    Refill:  3    Order Specific Question:   Supervising Provider    Answer:   Tresa Garter [[id number]]   glipiZIDE (GLUCOTROL) 10 MG tablet    Sig: Take 1 tablet (10 mg total) by mouth 2 (two) times daily before a meal.    Dispense:  180 tablet    Refill:  3    Order Specific Question:   Supervising Provider    Answer:   Tresa Garter [[id number]]   hydrALAZINE (APRESOLINE) 25 MG tablet    Sig: Take 1 tablet (25 mg total) by mouth every 8 (eight) hours.    Dispense:  270 each    Refill:  3    Order Specific Question:   Supervising Provider    Answer:   Tresa Garter [[id number]]   hydrochlorothiazide (HYDRODIURIL) 25 MG tablet    Sig: Take 1 tablet (25 mg total) by mouth daily.    Dispense:  90 tablet    Refill:  3    Order Specific Question:   Supervising Provider    Answer:   Tresa Garter [[id number]]   losartan (COZAAR) 100 MG tablet    Sig: Take 0.5 tablets (50 mg total) by mouth daily.    Dispense:  45 tablet    Refill:  3    Order Specific Question:   Supervising Provider    Answer:   Tresa Garter [[id number]]   metFORMIN (GLUCOPHAGE) 1000 MG tablet    Sig: Take 1  tablet (1,000 mg total) by mouth 2 (two) times daily with a meal.    Dispense:  180 tablet    Refill:  3    Order Specific Question:   Supervising Provider    Answer:   Tresa Garter [[id number]]   spironolactone (ALDACTONE) 25 MG tablet    Sig: Take 1 tablet (25 mg total) by mouth daily.    Dispense:  90 tablet    Refill:  3    Order Specific Question:   Supervising Provider    Answer:   Tresa Garter [[id number]]   glucose blood (ONETOUCH VERIO) test strip    Sig: 1 each by Other route 4 (four) times daily. Use as instructed    Dispense:  360 each    Refill:  3    Order Specific Question:   Supervising Provider    Answer:   Tresa Garter [id number]    Follow-up: Return in about 3 months (around 11/25/2021) for follow up DM [id number].    Vevelyn Francois, NP

## 2021-08-26 LAB — COMP. METABOLIC PANEL (12)
AST: 14 IU/L (ref 0–40)
Albumin/Globulin Ratio: 1.3 (ref 1.2–2.2)
Albumin: 4.4 g/dL (ref 3.8–4.9)
Alkaline Phosphatase: 71 IU/L (ref 44–121)
BUN/Creatinine Ratio: 23 (ref 9–23)
BUN: 15 mg/dL (ref 6–24)
Bilirubin Total: 0.4 mg/dL (ref 0.0–1.2)
Calcium: 9.7 mg/dL (ref 8.7–10.2)
Chloride: 94 mmol/L — ABNORMAL LOW (ref 96–106)
Creatinine, Ser: 0.64 mg/dL (ref 0.57–1.00)
Globulin, Total: 3.3 g/dL (ref 1.5–4.5)
Glucose: 261 mg/dL — ABNORMAL HIGH (ref 70–99)
Potassium: 4.9 mmol/L (ref 3.5–5.2)
Sodium: 136 mmol/L (ref 134–144)
Total Protein: 7.7 g/dL (ref 6.0–8.5)
eGFR: 106 mL/min/{1.73_m2} (ref >59)

## 2021-08-26 LAB — LIPID PANEL
Chol/HDL Ratio: 3.8 ratio (ref 0.0–4.4)
Cholesterol, Total: 219 mg/dL — ABNORMAL HIGH (ref 100–199)
HDL: 57 mg/dL (ref >39)
LDL Chol Calc (NIH): 145 mg/dL — ABNORMAL HIGH (ref 0–99)
Triglycerides: 96 mg/dL (ref 0–149)
VLDL Cholesterol Cal: 17 mg/dL (ref 5–40)

## 2021-08-26 LAB — TSH: TSH: 0.677 u[IU]/mL (ref 0.450–4.500)

## 2021-09-01 ENCOUNTER — Other Ambulatory Visit: Payer: Self-pay

## 2021-09-29 ENCOUNTER — Other Ambulatory Visit: Payer: Self-pay

## 2021-09-30 ENCOUNTER — Other Ambulatory Visit: Payer: Self-pay

## 2021-10-01 ENCOUNTER — Other Ambulatory Visit: Payer: Self-pay

## 2021-10-04 ENCOUNTER — Other Ambulatory Visit: Payer: Self-pay

## 2021-10-06 ENCOUNTER — Other Ambulatory Visit: Payer: Self-pay

## 2021-10-17 ENCOUNTER — Encounter: Payer: Self-pay | Admitting: Cardiology

## 2021-10-17 ENCOUNTER — Encounter: Payer: Self-pay | Admitting: *Deleted

## 2021-10-17 ENCOUNTER — Other Ambulatory Visit: Payer: Self-pay

## 2021-10-17 ENCOUNTER — Ambulatory Visit (INDEPENDENT_AMBULATORY_CARE_PROVIDER_SITE_OTHER): Payer: 59 | Admitting: Cardiology

## 2021-10-17 DIAGNOSIS — I471 Supraventricular tachycardia, unspecified: Secondary | ICD-10-CM | POA: Insufficient documentation

## 2021-10-17 DIAGNOSIS — E7800 Pure hypercholesterolemia, unspecified: Secondary | ICD-10-CM

## 2021-10-17 DIAGNOSIS — I5022 Chronic systolic (congestive) heart failure: Secondary | ICD-10-CM

## 2021-10-17 DIAGNOSIS — I10 Essential (primary) hypertension: Secondary | ICD-10-CM

## 2021-10-17 DIAGNOSIS — E1159 Type 2 diabetes mellitus with other circulatory complications: Secondary | ICD-10-CM | POA: Diagnosis not present

## 2021-10-17 DIAGNOSIS — I110 Hypertensive heart disease with heart failure: Secondary | ICD-10-CM

## 2021-10-17 DIAGNOSIS — Z6841 Body Mass Index (BMI) 40.0 and over, adult: Secondary | ICD-10-CM

## 2021-10-17 MED ORDER — METOPROLOL SUCCINATE ER 50 MG PO TB24: 50.0000 mg | ORAL_TABLET | Freq: Every day | ORAL | 3 refills | Status: DC

## 2021-10-17 MED ORDER — DILTIAZEM HCL ER COATED BEADS 120 MG PO CP24: 120.0000 mg | ORAL_CAPSULE | Freq: Every day | ORAL | 3 refills | Status: DC

## 2021-10-17 NOTE — Assessment & Plan Note (Signed)
Has been statin intolerant in the past.  Continue with diet, exercise.

## 2021-10-17 NOTE — Patient Instructions (Addendum)
Medication Instructions:  1) START Diltiazem 120 mg daily Continue all other medications as listed.  *If you need a refill on your cardiac medications before your next appointment, please call your pharmacy*  Follow-Up: At [facility], you and your health needs are our priority.  As part of our continuing mission to provide you with exceptional heart care, we have created designated Provider Care Teams.  These Care Teams include your primary Cardiologist (physician) and Advanced Practice Providers (APPs -  Physician Assistants and Nurse Practitioners) who all work together to provide you with the care you need, when you need it.  We recommend signing up for the patient portal called "MyChart".  Sign up information is provided on this After Visit Summary.  MyChart is used to connect with patients for Virtual Visits (Telemedicine).  Patients are able to view lab/test results, encounter notes, upcoming appointments, etc.  Non-urgent messages can be sent to your provider as well.   To learn more about what you can do with MyChart, go to [email or web address].    Your next appointment:   6 month(s)  The format for your next appointment:   In Person  Provider:   Melina Copa, PA-C, Ermalinda Barrios, PA-C, Christen Bame, NP, or Richardson Dopp, PA-C        Thank you for choosing [facility]!!

## 2021-10-17 NOTE — Assessment & Plan Note (Addendum)
EF has improved.  Breathing is improved.  NYHA class I.  Excellent.  Continue with amlodipine 5 mg losartan 100 mg hydrochlorothiazide 25 mg hydralazine 25 mg every 8 hours.  Spironolactone 25 mg a day.  I am going to initiate diltiazem CD120 mg.  I tried to start Toprol but she states that she had dizziness with this and she does not wish to take.

## 2021-10-17 NOTE — Assessment & Plan Note (Signed)
Continue to encourage weight loss, decrease carbohydrates.

## 2021-10-17 NOTE — Progress Notes (Signed)
Cardiology Office Note:    Date:  10/17/2021   ID:  Kentley Cedillo, DOB 06-27-1966, MRN [id number]  PCP:  Vevelyn Francois, NP   [facility] Providers Cardiologist:  Candee Furbish, MD     Referring MD: Azzie Glatter, FNP    History of Present Illness:    Claudia [name] is a 56 y.o. female here for follow-up of chronic systolic heart failure EF 40 to 45%.  Subsequently this returned normal EF 60%.  Heart cath-no CAD Diabetes uncontrolled  Panic attacks prior dizziness thought to be secondary to Toprol.  She went to the emergency room because of hyperglycemia on 06/09/2021.  Had EKG pictured below.  SVT 150  Eventually broke with metoprolol IV.  I would like for her to try the Toprol once again.  She is amenable to this but she does ask if she can come off of some other of the medications.  I think there will fairly crucial for her at this time.  Past Medical History:  Diagnosis Date   Diabetes mellitus    Fibroids    Headache(784.0)    History of echocardiogram    Echo 7/18: Moderate concentric LVH, EF 55-60, normal wall motion, grade 1 diastolic dysfunction, calcified aortic leaflets   Hypercholesterolemia    Hyperlipidemia    Hypertension    Osteoarthritis    , with mild meniscus tear, followed by orthopedics--Dr Gladstone Lighter 04/29/10   Right knee pain    Seizures ([place])    as child   Vitamin D deficiency     Past Surgical History:  Procedure Laterality Date   ANTERIOR CERVICAL DECOMP/DISCECTOMY FUSION N/A 09/07/2013   Procedure: ANTERIOR CERVICAL DECOMPRESSION/DISCECTOMY FUSION 1 LEVEL;  Surgeon: Sinclair Ship, MD;  Location: [place];  Service: Orthopedics;  Laterality: N/A;  Anterior cervical decompression fusion, cervical 5-6 with instrumentation and allograft   LAPAROSCOPIC ASSISTED VAGINAL HYSTERECTOMY  1/14   LEFT HEART CATH AND CORONARY ANGIOGRAPHY N/A 04/25/2018   Procedure: LEFT HEART CATH AND CORONARY ANGIOGRAPHY;  Surgeon: Lorretta Harp, MD;  Location:  [place] [facility];  Service: Cardiovascular;  Laterality: N/A;   SHOULDER SURGERY Right 11   rotator cuff   TUBAL LIGATION     UMBILICAL HERNIA REPAIR      Current Medications: Current Meds  Medication Sig   amLODipine (NORVASC) 5 MG tablet Take 1 tablet (5 mg total) by mouth daily.   aspirin 81 MG EC tablet Take 1 tablet (81 mg total) by mouth daily.   blood glucose meter kit and supplies KIT Dispense based on patient and insurance preference. Use up to four times daily as directed. (FOR ICD-9 250.00, 250.01).   diltiazem (CARDIZEM CD) 120 MG 24 hr capsule Take 1 capsule (120 mg total) by mouth daily.   ferrous sulfate 325 (65 FE) MG tablet Take 1 tablet (325 mg total) by mouth 2 (two) times daily.   glipiZIDE (GLUCOTROL) 10 MG tablet Take 1 tablet (10 mg total) by mouth 2 (two) times daily before a meal.   glucose blood (ONETOUCH VERIO) test strip Use 1 strip to check blood glucose 4 (four) times daily.   hydrALAZINE (APRESOLINE) 25 MG tablet Take 1 tablet (25 mg total) by mouth every 8 (eight) hours.   hydrochlorothiazide (HYDRODIURIL) 25 MG tablet Take 1 tablet (25 mg total) by mouth daily.   losartan (COZAAR) 100 MG tablet Take 0.5 tablets (50 mg total) by mouth daily.   metFORMIN (GLUCOPHAGE) 1000 MG tablet Take 1 tablet (1,000  mg total) by mouth 2 (two) times daily with a meal.   Omega-3 Fatty Acids (FISH OIL) 1000 MG CAPS Take by mouth.    spironolactone (ALDACTONE) 25 MG tablet Take 1 tablet (25 mg total) by mouth daily.   Vitamin D, Ergocalciferol, (DRISDOL) 1.25 MG (50000 UT) CAPS capsule Take 1 capsule (50,000 Units total) by mouth every 7 (seven) days.   [DISCONTINUED] metoprolol succinate (TOPROL-XL) 50 MG 24 hr tablet Take 1 tablet (50 mg total) by mouth daily. Take with or immediately following a meal.     Allergies:   Crestor [rosuvastatin], Hydrocodone, Other, Oxycodone, Pravachol [pravastatin sodium], and Simvastatin   Social History   Socioeconomic History    Marital status: Married    Spouse name: Not on file   Number of children: Not on file   Years of education: Not on file   Highest education level: Not on file  Occupational History   Not on file  Tobacco Use   Smoking status: Never   Smokeless tobacco: Never  Vaping Use   Vaping Use: Never used  Substance and Sexual Activity   Alcohol use: No   Drug use: No   Sexual activity: Yes  Other Topics Concern   Not on file  Social History Narrative   Not on file   Social Determinants of Health   Financial Resource Strain: Not on file  Food Insecurity: Not on file  Transportation Needs: Not on file  Physical Activity: Not on file  Stress: Not on file  Social Connections: Not on file     Family History: The patient's family history includes CVA in her maternal grandmother; Diabetes in her maternal grandmother and mother; Hypercholesterolemia in her mother; Hypertension in her maternal grandmother, mother, and sister. There is no history of Heart attack.  ROS:   Please see the history of present illness.    No syncope, no bleeding.  All other systems reviewed and are negative.  EKGs/Labs/Other Studies Reviewed:    The following studies were reviewed today: Cath 2019-no CAD Echo July 2019-EF 55 to 60%  EKG: Prior EKG from ER as below.  SVT.    Recent Labs: 06/09/2021: Hemoglobin 15.0; Platelets 455 08/25/2021: BUN 15; Creatinine, Ser 0.64; Potassium 4.9; Sodium 136; TSH 0.677  Recent Lipid Panel    Component Value Date/Time   CHOL 219 (H) 08/25/2021 0943   TRIG 96 08/25/2021 0943   HDL 57 08/25/2021 0943   CHOLHDL 3.8 08/25/2021 0943   CHOLHDL 3.8 02/25/2017 1536   VLDL 25 02/25/2017 1536   LDLCALC 145 (H) 08/25/2021 0943     Risk Assessment/Calculations:              Physical Exam:    VS:  BP 130/80 (BP Location: Left Arm, Patient Position: Sitting, Cuff Size: Large)    Pulse 98    Ht 5' 8"  (1.727 m)    Wt (!) 301 lb (136.5 kg)    LMP 05/09/2012    BMI  45.77 kg/m     Wt Readings from Last 3 Encounters:  10/17/21 (!) 301 lb (136.5 kg)  08/25/21 (!) 304 lb 3.2 oz (138 kg)  06/09/21 300 lb (136.1 kg)     GEN:  Well nourished, well developed in no acute distress HEENT: Normal NECK: No JVD; No carotid bruits LYMPHATICS: No lymphadenopathy CARDIAC: RRR, no murmurs, no rubs, gallops RESPIRATORY:  Clear to auscultation without rales, wheezing or rhonchi  ABDOMEN: Soft, non-tender, non-distended MUSCULOSKELETAL:  No edema; No deformity  SKIN: Warm and dry NEUROLOGIC:  Alert and oriented x 3 PSYCHIATRIC:  Normal affect   ASSESSMENT:    1. Hypertensive heart disease with chronic systolic congestive heart failure ([place])   2. Pure hypercholesterolemia   3. Type 2 diabetes mellitus with other circulatory complication, unspecified whether long term insulin use (HCC)   4. Class 3 severe obesity due to excess calories with serious comorbidity and body mass index (BMI) of 45.0 to 49.9 in adult ([place])   5. PSVT (paroxysmal supraventricular tachycardia) ([place])   6. Primary hypertension    PLAN:    In order of problems listed above:  Hypertensive heart disease with systolic congestive heart failure ([place]) EF has improved.  Breathing is improved.  NYHA class I.  Excellent.  Continue with amlodipine 5 mg losartan 100 mg hydrochlorothiazide 25 mg hydralazine 25 mg every 8 hours.  Spironolactone 25 mg a day.  I am going to initiate diltiazem CD120 mg.  I tried to start Toprol but she states that she had dizziness with this and she does not wish to take.  Hyperlipidemia Has been statin intolerant in the past.  Continue with diet, exercise.  DM (diabetes mellitus) ([place]) Has been uncontrolled in the past with A1c as high as 14.5.  Most recently 11.9  Class 3 severe obesity due to excess calories with serious comorbidity and body mass index (BMI) of 45.0 to 49.9 in adult [facility]) Continue to encourage weight loss, decrease carbohydrates.  PSVT  (paroxysmal supraventricular tachycardia) ([place]) She did not wish to start the Toprol because of prior dizziness.  We will go ahead and start the Cardizem 120 mg once a day.  See ECG in note.  At that time her heart rate was going 150 bpm, she was unaware of this.  I think it is for [place] to offer her protection with calcium channel blocker.  HTN (hypertension) She is on multidrug regimen.  Medications reviewed.  Lets continue.       Medication Adjustments/Labs and Tests Ordered: Current medicines are reviewed at length with the patient today.  Concerns regarding medicines are outlined above.  No orders of the defined types were placed in this encounter.  Meds ordered this encounter  Medications   DISCONTD: metoprolol succinate (TOPROL-XL) 50 MG 24 hr tablet    Sig: Take 1 tablet (50 mg total) by mouth daily. Take with or immediately following a meal.    Dispense:  90 tablet    Refill:  3   diltiazem (CARDIZEM CD) 120 MG 24 hr capsule    Sig: Take 1 capsule (120 mg total) by mouth daily.    Dispense:  90 capsule    Refill:  3    D/c Metoprolol    Patient Instructions  Medication Instructions:  1) START Diltiazem 120 mg daily Continue all other medications as listed.  *If you need a refill on your cardiac medications before your next appointment, please call your pharmacy*  Follow-Up: At [facility], you and your health needs are our priority.  As part of our continuing mission to provide you with exceptional heart care, we have created designated Provider Care Teams.  These Care Teams include your primary Cardiologist (physician) and Advanced Practice Providers (APPs -  Physician Assistants and Nurse Practitioners) who all work together to provide you with the care you need, when you need it.  We recommend signing up for the patient portal called "MyChart".  Sign up information is provided on this After Visit Summary.  MyChart  is used to connect with patients for Virtual Visits  (Telemedicine).  Patients are able to view lab/test results, encounter notes, upcoming appointments, etc.  Non-urgent messages can be sent to your provider as well.   To learn more about what you can do with MyChart, go to [email or web address].    Your next appointment:   6 month(s)  The format for your next appointment:   In Person  Provider:   Melina Copa, PA-C, Ermalinda Barrios, PA-C, Christen Bame, NP, or Richardson Dopp, PA-C        Thank you for choosing [facility]!!      Signed, Candee Furbish, MD  10/17/2021 2:58 PM    [place]

## 2021-10-17 NOTE — Assessment & Plan Note (Addendum)
She did not wish to start the Toprol because of prior dizziness.  We will go ahead and start the Cardizem 120 mg once a day.  See ECG in note.  At that time her heart rate was going 150 bpm, she was unaware of this.  I think it is for [place] to offer her protection with calcium channel blocker.

## 2021-10-17 NOTE — Assessment & Plan Note (Addendum)
Has been uncontrolled in the past with A1c as high as 14.5.  Most recently 65.9

## 2021-10-17 NOTE — Assessment & Plan Note (Signed)
She is on multidrug regimen.  Medications reviewed.  Lets continue.

## 2021-10-22 ENCOUNTER — Other Ambulatory Visit: Payer: Self-pay

## 2021-10-22 ENCOUNTER — Encounter: Payer: Self-pay | Admitting: Plastic Surgery

## 2021-10-22 ENCOUNTER — Other Ambulatory Visit: Payer: Self-pay | Admitting: Family Medicine

## 2021-10-22 ENCOUNTER — Ambulatory Visit
Admission: RE | Admit: 2021-10-22 | Discharge: 2021-10-22 | Disposition: A | Discharge status: Home / Self Care | Payer: 59 | Source: Ambulatory Visit | Attending: Family Medicine | Admitting: Family Medicine

## 2021-10-22 ENCOUNTER — Ambulatory Visit (INDEPENDENT_AMBULATORY_CARE_PROVIDER_SITE_OTHER): Payer: 59 | Admitting: Plastic Surgery

## 2021-10-22 VITALS — BP 161/104 | HR 103 | Ht 68.0 in | Wt 298.2 lb

## 2021-10-22 DIAGNOSIS — M545 Low back pain, unspecified: Secondary | ICD-10-CM

## 2021-10-22 DIAGNOSIS — M546 Pain in thoracic spine: Secondary | ICD-10-CM

## 2021-10-22 DIAGNOSIS — Z1231 Encounter for screening mammogram for malignant neoplasm of breast: Secondary | ICD-10-CM

## 2021-10-22 DIAGNOSIS — M4004 Postural kyphosis, thoracic region: Secondary | ICD-10-CM | POA: Diagnosis not present

## 2021-10-22 DIAGNOSIS — N62 Hypertrophy of breast: Secondary | ICD-10-CM

## 2021-10-22 NOTE — Progress Notes (Signed)
Referring Provider Vevelyn Francois, NP [address] [place],  [place] [phone]   CC:  Chief Complaint  Patient presents with   Advice Only      Claudia [name] is an 56 y.o. female.  HPI: Patient presents to discuss breast reduction.  She has had years of back pain, neck pain and shoulder grooving related to her large breast.  She tried over-the-counter medications, warm packs, cold packs and supportive bras with little relief.  She also gets rashes beneath her breast that been refractory to over-the-counter treatments.  She is currently a triple D and wants to be a proportionate size.  No family history of breast cancer no previous breast biopsies or procedures herself.  She does not smoke but is diabetic with a recent hemoglobin A1c of 11.9.  Allergies  Allergen Reactions   Crestor [Rosuvastatin]     MUSCLE ACHES HEADACHES    Hydrocodone Itching   Other Hives, Swelling and Other (See Comments)    Vision changes; ALLERGIC TO ALL NUTS EXCEPT PEANUTS   Oxycodone Itching   Pravachol [Pravastatin Sodium] Itching    Joint pain   Simvastatin Hives    Outpatient Encounter Medications as of 10/22/2021  Medication Sig   amLODipine (NORVASC) 5 MG tablet Take 1 tablet (5 mg total) by mouth daily.   aspirin 81 MG EC tablet Take 1 tablet (81 mg total) by mouth daily.   blood glucose meter kit and supplies KIT Dispense based on patient and insurance preference. Use up to four times daily as directed. (FOR ICD-9 250.00, 250.01).   diltiazem (CARDIZEM CD) 120 MG 24 hr capsule Take 1 capsule (120 mg total) by mouth daily.   ferrous sulfate 325 (65 FE) MG tablet Take 1 tablet (325 mg total) by mouth 2 (two) times daily.   glipiZIDE (GLUCOTROL) 10 MG tablet Take 1 tablet (10 mg total) by mouth 2 (two) times daily before a meal.   glucose blood (ONETOUCH VERIO) test strip Use 1 strip to check blood glucose 4 (four) times daily.   hydrALAZINE (APRESOLINE) 25 MG tablet Take 1 tablet (25 mg  total) by mouth every 8 (eight) hours.   hydrochlorothiazide (HYDRODIURIL) 25 MG tablet Take 1 tablet (25 mg total) by mouth daily.   losartan (COZAAR) 100 MG tablet Take 0.5 tablets (50 mg total) by mouth daily.   metFORMIN (GLUCOPHAGE) 1000 MG tablet Take 1 tablet (1,000 mg total) by mouth 2 (two) times daily with a meal.   Omega-3 Fatty Acids (FISH OIL) 1000 MG CAPS Take by mouth.    spironolactone (ALDACTONE) 25 MG tablet Take 1 tablet (25 mg total) by mouth daily.   Vitamin D, Ergocalciferol, (DRISDOL) 1.25 MG (50000 UT) CAPS capsule Take 1 capsule (50,000 Units total) by mouth every 7 (seven) days.   No facility-administered encounter medications on file as of 10/22/2021.     Past Medical History:  Diagnosis Date   Diabetes mellitus    Fibroids    Headache(784.0)    History of echocardiogram    Echo 7/18: Moderate concentric LVH, EF 55-60, normal wall motion, grade 1 diastolic dysfunction, calcified aortic leaflets   Hypercholesterolemia    Hyperlipidemia    Hypertension    Osteoarthritis    , with mild meniscus tear, followed by orthopedics--Dr Gladstone Lighter 04/29/10   Right knee pain    Seizures ([place])    as child   Vitamin D deficiency     Past Surgical History:  Procedure Laterality Date  ANTERIOR CERVICAL DECOMP/DISCECTOMY FUSION N/A 09/07/2013   Procedure: ANTERIOR CERVICAL DECOMPRESSION/DISCECTOMY FUSION 1 LEVEL;  Surgeon: Sinclair Ship, MD;  Location: [facility];  Service: Orthopedics;  Laterality: N/A;  Anterior cervical decompression fusion, cervical 5-6 with instrumentation and allograft   LAPAROSCOPIC ASSISTED VAGINAL HYSTERECTOMY  1/14   LEFT HEART CATH AND CORONARY ANGIOGRAPHY N/A 04/25/2018   Procedure: LEFT HEART CATH AND CORONARY ANGIOGRAPHY;  Surgeon: Lorretta Harp, MD;  Location: [place];  Service: Cardiovascular;  Laterality: N/A;   SHOULDER SURGERY Right 11   rotator cuff   TUBAL LIGATION     UMBILICAL HERNIA REPAIR      Family History   Problem Relation Age of Onset   Hypertension Mother    Diabetes Mother    Hypercholesterolemia Mother    Hypertension Sister    CVA Maternal Grandmother    Hypertension Maternal Grandmother    Diabetes Maternal Grandmother    Heart attack Neg Hx     Social History   Social History Narrative   Not on file     Review of Systems General: Denies fevers, chills, weight loss CV: Denies chest pain, shortness of breath, palpitations  Physical Exam Vitals with BMI 10/22/2021 10/17/2021 08/25/2021  Height 5' 8"  5' 8"  5' 8"   Weight 298 lbs 3 oz 301 lbs 304 lbs 3 oz  BMI 45.35 62.83 15.17  Systolic 616 073 710  Diastolic 626 80 83  Pulse 103 98 93    General:  No acute distress,  Alert and oriented, Non-Toxic, Normal speech and affect Breast: She has grade 3 ptosis.  Sternal notch to nipple is 37 cm bilaterally.  Nipple to fold is 21 cm bilaterally.  No obvious scars or masses.  Assessment/Plan Had a long talk with the patient about breast reduction.  We discussed the details of the operation along with the risks that include bleeding, infection, damage to surrounding structures need for additional procedures.  We discussed the potential for wound healing complications and that she is at a higher risk for those given her elevated hemoglobin A1c.  I explained she would need to get her hemoglobin A1c below 8 in order to be considered a candidate for the procedure.  She is understanding and is already working on getting better control of her blood sugar through diet and medications.  Once her hemoglobin A1c is below 8 I am happy to see her again and move ahead with the surgery.  All of her questions were answered.  Cindra Presume 10/22/2021, 5:05 PM

## 2021-11-25 ENCOUNTER — Other Ambulatory Visit: Payer: Self-pay

## 2021-11-26 ENCOUNTER — Ambulatory Visit: Payer: 59 | Admitting: Nurse Practitioner

## 2021-12-02 ENCOUNTER — Other Ambulatory Visit: Payer: Self-pay

## 2022-01-19 ENCOUNTER — Emergency Department (HOSPITAL_COMMUNITY): Payer: 59

## 2022-01-19 ENCOUNTER — Encounter (HOSPITAL_COMMUNITY): Payer: Self-pay

## 2022-01-19 ENCOUNTER — Inpatient Hospital Stay (HOSPITAL_COMMUNITY)
Admission: EM | Admit: 2022-01-19 | Discharge: 2022-01-25 | DRG: 871 | Disposition: A | Discharge status: Home / Self Care | Payer: 59 | Attending: Family Medicine | Admitting: Family Medicine

## 2022-01-19 DIAGNOSIS — Z20822 Contact with and (suspected) exposure to covid-19: Secondary | ICD-10-CM | POA: Diagnosis present

## 2022-01-19 DIAGNOSIS — E1165 Type 2 diabetes mellitus with hyperglycemia: Secondary | ICD-10-CM | POA: Diagnosis present

## 2022-01-19 DIAGNOSIS — E7800 Pure hypercholesterolemia, unspecified: Secondary | ICD-10-CM | POA: Diagnosis present

## 2022-01-19 DIAGNOSIS — J9601 Acute respiratory failure with hypoxia: Secondary | ICD-10-CM | POA: Diagnosis present

## 2022-01-19 DIAGNOSIS — I10 Essential (primary) hypertension: Secondary | ICD-10-CM | POA: Diagnosis present

## 2022-01-19 DIAGNOSIS — Z823 Family history of stroke: Secondary | ICD-10-CM

## 2022-01-19 DIAGNOSIS — A419 Sepsis, unspecified organism: Secondary | ICD-10-CM | POA: Diagnosis not present

## 2022-01-19 DIAGNOSIS — Z8249 Family history of ischemic heart disease and other diseases of the circulatory system: Secondary | ICD-10-CM

## 2022-01-19 DIAGNOSIS — Z83438 Family history of other disorder of lipoprotein metabolism and other lipidemia: Secondary | ICD-10-CM

## 2022-01-19 DIAGNOSIS — J189 Pneumonia, unspecified organism: Principal | ICD-10-CM

## 2022-01-19 DIAGNOSIS — D509 Iron deficiency anemia, unspecified: Secondary | ICD-10-CM | POA: Diagnosis present

## 2022-01-19 DIAGNOSIS — I5042 Chronic combined systolic (congestive) and diastolic (congestive) heart failure: Secondary | ICD-10-CM | POA: Diagnosis present

## 2022-01-19 DIAGNOSIS — I252 Old myocardial infarction: Secondary | ICD-10-CM

## 2022-01-19 DIAGNOSIS — E119 Type 2 diabetes mellitus without complications: Secondary | ICD-10-CM

## 2022-01-19 DIAGNOSIS — I2510 Atherosclerotic heart disease of native coronary artery without angina pectoris: Secondary | ICD-10-CM | POA: Diagnosis present

## 2022-01-19 DIAGNOSIS — I110 Hypertensive heart disease with heart failure: Secondary | ICD-10-CM | POA: Diagnosis present

## 2022-01-19 DIAGNOSIS — Z833 Family history of diabetes mellitus: Secondary | ICD-10-CM

## 2022-01-19 DIAGNOSIS — R042 Hemoptysis: Secondary | ICD-10-CM | POA: Diagnosis present

## 2022-01-19 DIAGNOSIS — N179 Acute kidney failure, unspecified: Secondary | ICD-10-CM | POA: Diagnosis present

## 2022-01-19 DIAGNOSIS — E1159 Type 2 diabetes mellitus with other circulatory complications: Secondary | ICD-10-CM

## 2022-01-19 DIAGNOSIS — E1169 Type 2 diabetes mellitus with other specified complication: Secondary | ICD-10-CM

## 2022-01-19 DIAGNOSIS — Z6841 Body Mass Index (BMI) 40.0 and over, adult: Secondary | ICD-10-CM

## 2022-01-19 HISTORY — DX: ST elevation (STEMI) myocardial infarction of unspecified site: I21.3

## 2022-01-19 LAB — CBG MONITORING, ED: Glucose-Capillary: 375 mg/dL — ABNORMAL HIGH (ref 70–99)

## 2022-01-19 MED ORDER — LACTATED RINGERS IV BOLUS (SEPSIS)
1000.0000 mL | Freq: Once | INTRAVENOUS | Status: AC
  Administered 2022-01-19: 1000 mL via INTRAVENOUS

## 2022-01-19 MED ORDER — SODIUM CHLORIDE 0.9 % IV SOLN
500.0000 mg | INTRAVENOUS | Status: DC
  Administered 2022-01-20 – 2022-01-21 (×3): 500 mg via INTRAVENOUS
  Filled 2022-01-19 (×3): qty 5

## 2022-01-19 MED ORDER — SODIUM CHLORIDE 0.9 % IV SOLN
2.0000 g | INTRAVENOUS | Status: AC
  Administered 2022-01-19 – 2022-01-24 (×5): 2 g via INTRAVENOUS
  Filled 2022-01-19 (×5): qty 20

## 2022-01-19 NOTE — ED Triage Notes (Signed)
Pt to ED via EMS. Pt c/o cough, fever, tachycardia, and generalized weakness x3 weeks. Pt was 60-70% on RA when EMS arrived. Pt placed on 4L [place] by EMS. O2 90% on 4L [place]. Pt's HR was in 140s with EMS-  130s upon arrival to ED. Pt's Bgl also elevated with EMS at 420. Pt has hx of STEMI. Pt also c/o headache. Pt endorses SOB.  ? ?EMS Vitals:  ?HR 144 ?90% on 4L [place] ?132/74 ? ?

## 2022-01-19 NOTE — Sepsis Progress Note (Signed)
Following per sepsis protocol   

## 2022-01-19 NOTE — ED Provider Notes (Signed)
?[place] DEPT ?[facility] Emergency Department ?Provider Note ?MRN:  [id number]  ?Arrival date & time: 01/20/22    ? ?Chief Complaint   ?Fever, Code Sepsis, hypoxia, and Tachycardia ?  ?History of Present Illness   ?Claudia [name] is a 56 y.o. year-old female with a history of hypertension, diabetes, CAD presenting to the ED with chief complaint of fever. ? ?Cough for the past 3 weeks, today with high fever, shortness of breath, noted to be hypoxic with EMS. ? ?Review of Systems  ?A thorough review of systems was obtained and all systems are negative except as noted in the HPI and PMH.  ? ?Patient's Health History   ? ?Past Medical History:  ?Diagnosis Date  ? Diabetes mellitus   ? Fibroids   ? Headache(784.0)   ? History of echocardiogram   ? Echo 7/18: Moderate concentric LVH, EF 55-60, normal wall motion, grade 1 diastolic dysfunction, calcified aortic leaflets  ? Hypercholesterolemia   ? Hyperlipidemia   ? Hypertension   ? Osteoarthritis   ? , with mild meniscus tear, followed by orthopedics--Dr Gladstone Lighter 04/29/10  ? Right knee pain   ? Seizures ([place])   ? as child  ? STEMI (ST elevation myocardial infarction) ([place])   ? Vitamin D deficiency   ?  ?Past Surgical History:  ?Procedure Laterality Date  ? ANTERIOR CERVICAL DECOMP/DISCECTOMY FUSION N/A 09/07/2013  ? Procedure: ANTERIOR CERVICAL DECOMPRESSION/DISCECTOMY FUSION 1 LEVEL;  Surgeon: Sinclair Ship, MD;  Location: [place];  Service: Orthopedics;  Laterality: N/A;  Anterior cervical decompression fusion, cervical 5-6 with instrumentation and allograft  ? LAPAROSCOPIC ASSISTED VAGINAL HYSTERECTOMY  1/14  ? LEFT HEART CATH AND CORONARY ANGIOGRAPHY N/A 04/25/2018  ? Procedure: LEFT HEART CATH AND CORONARY ANGIOGRAPHY;  Surgeon: Lorretta Harp, MD;  Location: [place] [facility];  Service: Cardiovascular;  Laterality: N/A;  ? SHOULDER SURGERY Right 11  ? rotator cuff  ? TUBAL LIGATION    ? UMBILICAL HERNIA REPAIR    ?  ?Family History  ?Problem  Relation Age of Onset  ? Hypertension Mother   ? Diabetes Mother   ? Hypercholesterolemia Mother   ? Hypertension Sister   ? CVA Maternal Grandmother   ? Hypertension Maternal Grandmother   ? Diabetes Maternal Grandmother   ? Heart attack Neg Hx   ?  ?Social History  ? ?Socioeconomic History  ? Marital status: Married  ?  Spouse name: Not on file  ? Number of children: Not on file  ? Years of education: Not on file  ? Highest education level: Not on file  ?Occupational History  ? Not on file  ?Tobacco Use  ? Smoking status: Never  ? Smokeless tobacco: Never  ?Vaping Use  ? Vaping Use: Never used  ?Substance and Sexual Activity  ? Alcohol use: No  ? Drug use: No  ? Sexual activity: Yes  ?Other Topics Concern  ? Not on file  ?Social History Narrative  ? Not on file  ? ?Social Determinants of Health  ? ?Financial Resource Strain: Not on file  ?Food Insecurity: Not on file  ?Transportation Needs: Not on file  ?Physical Activity: Not on file  ?Stress: Not on file  ?Social Connections: Not on file  ?Intimate Partner Violence: Not on file  ?  ? ?Physical Exam  ? ?Vitals:  ? 01/20/22 0130 01/20/22 0149  ?BP: 118/86 127/87  ?Pulse: (!) 103 (!) 107  ?Resp: 18 (!) 27  ?Temp:    ?SpO2: 96% 96%  ?  ?  CONSTITUTIONAL: Well-appearing, diaphoretic ?NEURO/PSYCH:  Alert and oriented x 3, no focal deficits ?EYES:  eyes equal and reactive ?ENT/NECK:  no LAD, no JVD ?CARDIO: Tachycardic rate, well-perfused, normal S1 and S2 ?PULM:  CTAB no wheezing or rhonchi, tachypneic ?GI/GU:  non-distended, non-tender ?MSK/SPINE:  No gross deformities, no edema ?SKIN:  no rash, atraumatic ? ? ?*Additional and/or pertinent findings included in MDM below ? ?Diagnostic and Interventional Summary  ? ? EKG Interpretation ? ?Date/Time:  Monday January 19 2022 23:23:19 EDT ?Ventricular Rate:  128 ?PR Interval:  175 ?QRS Duration: 112 ?QT Interval:  331 ?QTC Calculation: 483 ?R Axis:   148 ?Text Interpretation: Sinus tachycardia IRBBB and LPFB Low voltage,  precordial leads Baseline wander in lead(s) V6 Confirmed by Gerlene Fee [phone]) on 01/20/2022 12:21:23 AM ?  ? ?  ? ?Labs Reviewed  ?COMPREHENSIVE METABOLIC PANEL - Abnormal; Notable for the following components:  ?    Result Value  ? Glucose, Bld 391 (*)   ? Creatinine, Ser 1.05 (*)   ? All other components within normal limits  ?CBC WITH DIFFERENTIAL/PLATELET - Abnormal; Notable for the following components:  ? WBC 21.5 (*)   ? RBC 5.57 (*)   ? MCV 73.4 (*)   ? MCH 21.9 (*)   ? MCHC 29.8 (*)   ? RDW 16.2 (*)   ? Neutro Abs 18.7 (*)   ? Monocytes Absolute 1.6 (*)   ? Abs Immature Granulocytes 0.21 (*)   ? All other components within normal limits  ?CBG MONITORING, ED - Abnormal; Notable for the following components:  ? Glucose-Capillary 375 (*)   ? All other components within normal limits  ?RESP PANEL BY RT-PCR (FLU A&B, COVID) [id number]  ?CULTURE, BLOOD (ROUTINE X 2)  ?CULTURE, BLOOD (ROUTINE X 2)  ?URINE CULTURE  ?LACTIC ACID, PLASMA  ?PROTIME-INR  ?APTT  ?LACTIC ACID, PLASMA  ?URINALYSIS, ROUTINE W REFLEX MICROSCOPIC  ?I-STAT BETA HCG BLOOD, ED (MC, WL, AP ONLY)  ?  ?DG Chest Port 1 View  ?Final Result  ?  ?  ?Medications  ?cefTRIAXone (ROCEPHIN) 2 g in sodium chloride 0.9 % 100 mL IVPB (0 g Intravenous Stopped 01/20/22 0027)  ?azithromycin (ZITHROMAX) 500 mg in sodium chloride 0.9 % 250 mL IVPB (0 mg Intravenous Stopped 01/20/22 0107)  ?lactated ringers bolus 1,000 mL (0 mLs Intravenous Stopped 01/20/22 0017)  ?  And  ?lactated ringers bolus 1,000 mL (0 mLs Intravenous Stopped 01/20/22 0017)  ?  And  ?lactated ringers bolus 1,000 mL (1,000 mLs Intravenous New Bag/Given 01/19/22 2347)  ?acetaminophen (TYLENOL) tablet 1,000 mg (1,000 mg Oral Given 01/20/22 0131)  ?  ? ?Procedures  /  Critical Care ?.Critical Care ?Performed by: Maudie Flakes, MD ?Authorized by: Maudie Flakes, MD  ? ?Critical care provider statement:  ?  Critical care time (minutes):  45 ?  Critical care was necessary to treat or prevent imminent or  life-threatening deterioration of the following conditions:  Sepsis ?  Critical care was time spent personally by me on the following activities:  Development of treatment plan with patient or surrogate, discussions with consultants, evaluation of patient's response to treatment, examination of patient, ordering and review of laboratory studies, ordering and review of radiographic studies, ordering and performing treatments and interventions, pulse oximetry, re-evaluation of patient's condition and review of old charts ? ?ED Course and Medical Decision Making  ?Initial Impression and Ddx ?Concern for sepsis of pulmonary origin given the fever, persistent cough for 3 weeks, now  presenting with tachycardia, hypoxic respiratory failure, fever, hypotension.  Code sepsis initiated, starting antibiotics, bolus fluids, will monitor closely. ? ?Past medical/surgical history that increases complexity of ED encounter: CAD ? ?Interpretation of Diagnostics ?I personally reviewed the Chest Xray and my interpretation is as follows: Obvious opacity to the right middle lobe, no pneumothorax ?   ? ? ?Patient Reassessment and Ultimate Disposition/Management ?Patient has responded well to IV fluids, tachycardia nearly resolved, patient is now normotensive over the past 1 or 2 hours.  Clinically looks improved.  Requiring 6 L nasal cannula to maintain oxygen saturations, will admit to stepdown. ? ?Patient management required discussion with the following services or consulting groups:  Hospitalist Service ? ?Complexity of Problems Addressed ?Acute illness or injury that poses threat of life of bodily function ? ?Additional Data Reviewed and Analyzed ?Further history obtained from: ?Further history from spouse/family member ? ?Additional Factors Impacting ED Encounter Risk ?Consideration of hospitalization ? ?Barth Kirks. Sedonia Small, MD ?[facility] Emergency Medicine ?[place] [email or web address] ? ?Final Clinical  Impressions(s) / ED Diagnoses  ? ?  ICD-10-CM   ?1. Community acquired pneumonia of right middle lobe of lung  J18.9   ?  ?2. Sepsis, due to unspecified organism, unspecified whether acute organ dysfunction present (H

## 2022-01-20 ENCOUNTER — Encounter (HOSPITAL_COMMUNITY): Payer: Self-pay | Admitting: Internal Medicine

## 2022-01-20 ENCOUNTER — Inpatient Hospital Stay (HOSPITAL_COMMUNITY): Payer: 59

## 2022-01-20 ENCOUNTER — Other Ambulatory Visit: Payer: Self-pay

## 2022-01-20 DIAGNOSIS — E1159 Type 2 diabetes mellitus with other circulatory complications: Secondary | ICD-10-CM

## 2022-01-20 DIAGNOSIS — Z6841 Body Mass Index (BMI) 40.0 and over, adult: Secondary | ICD-10-CM | POA: Diagnosis not present

## 2022-01-20 DIAGNOSIS — I110 Hypertensive heart disease with heart failure: Secondary | ICD-10-CM | POA: Diagnosis present

## 2022-01-20 DIAGNOSIS — J9601 Acute respiratory failure with hypoxia: Secondary | ICD-10-CM

## 2022-01-20 DIAGNOSIS — D509 Iron deficiency anemia, unspecified: Secondary | ICD-10-CM | POA: Diagnosis present

## 2022-01-20 DIAGNOSIS — A419 Sepsis, unspecified organism: Principal | ICD-10-CM

## 2022-01-20 DIAGNOSIS — N179 Acute kidney failure, unspecified: Secondary | ICD-10-CM | POA: Diagnosis present

## 2022-01-20 DIAGNOSIS — J9621 Acute and chronic respiratory failure with hypoxia: Secondary | ICD-10-CM | POA: Insufficient documentation

## 2022-01-20 DIAGNOSIS — E1165 Type 2 diabetes mellitus with hyperglycemia: Secondary | ICD-10-CM | POA: Diagnosis present

## 2022-01-20 DIAGNOSIS — I2510 Atherosclerotic heart disease of native coronary artery without angina pectoris: Secondary | ICD-10-CM | POA: Diagnosis present

## 2022-01-20 DIAGNOSIS — I5042 Chronic combined systolic (congestive) and diastolic (congestive) heart failure: Secondary | ICD-10-CM | POA: Diagnosis present

## 2022-01-20 DIAGNOSIS — I10 Essential (primary) hypertension: Secondary | ICD-10-CM | POA: Diagnosis not present

## 2022-01-20 DIAGNOSIS — I252 Old myocardial infarction: Secondary | ICD-10-CM | POA: Diagnosis not present

## 2022-01-20 DIAGNOSIS — R042 Hemoptysis: Secondary | ICD-10-CM | POA: Diagnosis present

## 2022-01-20 DIAGNOSIS — J189 Pneumonia, unspecified organism: Secondary | ICD-10-CM | POA: Diagnosis not present

## 2022-01-20 DIAGNOSIS — Z823 Family history of stroke: Secondary | ICD-10-CM | POA: Diagnosis not present

## 2022-01-20 DIAGNOSIS — Z20822 Contact with and (suspected) exposure to covid-19: Secondary | ICD-10-CM | POA: Diagnosis present

## 2022-01-20 DIAGNOSIS — E7800 Pure hypercholesterolemia, unspecified: Secondary | ICD-10-CM | POA: Diagnosis present

## 2022-01-20 DIAGNOSIS — Z83438 Family history of other disorder of lipoprotein metabolism and other lipidemia: Secondary | ICD-10-CM | POA: Diagnosis not present

## 2022-01-20 DIAGNOSIS — Z8249 Family history of ischemic heart disease and other diseases of the circulatory system: Secondary | ICD-10-CM | POA: Diagnosis not present

## 2022-01-20 DIAGNOSIS — Z833 Family history of diabetes mellitus: Secondary | ICD-10-CM | POA: Diagnosis not present

## 2022-01-20 LAB — PROTIME-INR
INR: 1 (ref 0.8–1.2)
Prothrombin Time: 12.9 seconds (ref 11.4–15.2)

## 2022-01-20 LAB — CBC
HCT: 38.7 % (ref 36.0–46.0)
Hemoglobin: 11.8 g/dL — ABNORMAL LOW (ref 12.0–15.0)
MCH: 22.3 pg — ABNORMAL LOW (ref 26.0–34.0)
MCHC: 30.5 g/dL (ref 30.0–36.0)
MCV: 73.2 fL — ABNORMAL LOW (ref 80.0–100.0)
Platelets: 385 10*3/uL (ref 150–400)
RBC: 5.29 MIL/uL — ABNORMAL HIGH (ref 3.87–5.11)
RDW: 16.3 % — ABNORMAL HIGH (ref 11.5–15.5)
WBC: 25.1 10*3/uL — ABNORMAL HIGH (ref 4.0–10.5)
nRBC: 0 % (ref 0.0–0.2)

## 2022-01-20 LAB — CBC WITH DIFFERENTIAL/PLATELET
Abs Immature Granulocytes: 0.21 10*3/uL — ABNORMAL HIGH (ref 0.00–0.07)
Basophils Absolute: 0 10*3/uL (ref 0.0–0.1)
Basophils Relative: 0 %
Eosinophils Absolute: 0 10*3/uL (ref 0.0–0.5)
Eosinophils Relative: 0 %
HCT: 40.9 % (ref 36.0–46.0)
Hemoglobin: 12.2 g/dL (ref 12.0–15.0)
Immature Granulocytes: 1 %
Lymphocytes Relative: 4 %
Lymphs Abs: 0.9 10*3/uL (ref 0.7–4.0)
MCH: 21.9 pg — ABNORMAL LOW (ref 26.0–34.0)
MCHC: 29.8 g/dL — ABNORMAL LOW (ref 30.0–36.0)
MCV: 73.4 fL — ABNORMAL LOW (ref 80.0–100.0)
Monocytes Absolute: 1.6 10*3/uL — ABNORMAL HIGH (ref 0.1–1.0)
Monocytes Relative: 8 %
Neutro Abs: 18.7 10*3/uL — ABNORMAL HIGH (ref 1.7–7.7)
Neutrophils Relative %: 87 %
Platelets: 384 10*3/uL (ref 150–400)
RBC: 5.57 MIL/uL — ABNORMAL HIGH (ref 3.87–5.11)
RDW: 16.2 % — ABNORMAL HIGH (ref 11.5–15.5)
WBC: 21.5 10*3/uL — ABNORMAL HIGH (ref 4.0–10.5)
nRBC: 0 % (ref 0.0–0.2)

## 2022-01-20 LAB — URINALYSIS, ROUTINE W REFLEX MICROSCOPIC
Bilirubin Urine: NEGATIVE
Glucose, UA: >=500 mg/dL — AB
Hgb urine dipstick: NEGATIVE
Ketones, ur: NEGATIVE mg/dL
Nitrite: NEGATIVE
Protein, ur: NEGATIVE mg/dL
Specific Gravity, Urine: >1.03 — ABNORMAL HIGH (ref 1.005–1.030)
pH: 5.5 (ref 5.0–8.0)

## 2022-01-20 LAB — COMPREHENSIVE METABOLIC PANEL
ALT: 22 U/L (ref 0–44)
AST: 20 U/L (ref 15–41)
Albumin: 3.8 g/dL (ref 3.5–5.0)
Alkaline Phosphatase: 53 U/L (ref 38–126)
Anion gap: 11 (ref 5–15)
BUN: 20 mg/dL (ref 6–20)
CO2: 26 mmol/L (ref 22–32)
Calcium: 8.9 mg/dL (ref 8.9–10.3)
Chloride: 98 mmol/L (ref 98–111)
Creatinine, Ser: 1.05 mg/dL — ABNORMAL HIGH (ref 0.44–1.00)
GFR, Estimated: >60 mL/min (ref >60)
Glucose, Bld: 391 mg/dL — ABNORMAL HIGH (ref 70–99)
Potassium: 3.9 mmol/L (ref 3.5–5.1)
Sodium: 135 mmol/L (ref 135–145)
Total Bilirubin: 0.7 mg/dL (ref 0.3–1.2)
Total Protein: 7.8 g/dL (ref 6.5–8.1)

## 2022-01-20 LAB — URINALYSIS, MICROSCOPIC (REFLEX)

## 2022-01-20 LAB — HEMOGLOBIN A1C
Hgb A1c MFr Bld: 9.3 % — ABNORMAL HIGH (ref 4.8–5.6)
Mean Plasma Glucose: 220.21 mg/dL

## 2022-01-20 LAB — CBG MONITORING, ED
Glucose-Capillary: 219 mg/dL — ABNORMAL HIGH (ref 70–99)
Glucose-Capillary: 176 mg/dL — ABNORMAL HIGH (ref 70–99)
Glucose-Capillary: 206 mg/dL — ABNORMAL HIGH (ref 70–99)

## 2022-01-20 LAB — HIV ANTIBODY (ROUTINE TESTING W REFLEX): HIV Screen 4th Generation wRfx: NONREACTIVE

## 2022-01-20 LAB — RESP PANEL BY RT-PCR (FLU A&B, COVID) ARPGX2
Influenza A by PCR: NEGATIVE
Influenza B by PCR: NEGATIVE
SARS Coronavirus 2 by RT PCR: NEGATIVE

## 2022-01-20 LAB — CREATININE, SERUM
Creatinine, Ser: 0.85 mg/dL (ref 0.44–1.00)
GFR, Estimated: >60 mL/min (ref >60)

## 2022-01-20 LAB — LACTIC ACID, PLASMA
Lactic Acid, Venous: 1.8 mmol/L (ref 0.5–1.9)
Lactic Acid, Venous: 1.5 mmol/L (ref 0.5–1.9)

## 2022-01-20 LAB — I-STAT BETA HCG BLOOD, ED (MC, WL, AP ONLY): I-stat hCG, quantitative: <5 m[IU]/mL (ref <5)

## 2022-01-20 LAB — TROPONIN I (HIGH SENSITIVITY)
Troponin I (High Sensitivity): 13 ng/L (ref <18)
Troponin I (High Sensitivity): 13 ng/L (ref <18)

## 2022-01-20 LAB — APTT: aPTT: 30 seconds (ref 24–36)

## 2022-01-20 LAB — GLUCOSE, CAPILLARY: Glucose-Capillary: 220 mg/dL — ABNORMAL HIGH (ref 70–99)

## 2022-01-20 LAB — STREP PNEUMONIAE URINARY ANTIGEN: Strep Pneumo Urinary Antigen: NEGATIVE

## 2022-01-20 MED ORDER — DILTIAZEM HCL ER COATED BEADS 120 MG PO CP24
120.0000 mg | ORAL_CAPSULE | Freq: Every day | ORAL | Status: DC
  Filled 2022-01-20: qty 1

## 2022-01-20 MED ORDER — OMEGA-3-ACID ETHYL ESTERS 1 G PO CAPS
1.0000 g | ORAL_CAPSULE | Freq: Every day | ORAL | Status: DC
  Administered 2022-01-20 – 2022-01-24 (×5): 1 g via ORAL
  Filled 2022-01-20 (×7): qty 1

## 2022-01-20 MED ORDER — ACETAMINOPHEN 325 MG PO TABS
650.0000 mg | ORAL_TABLET | Freq: Four times a day (QID) | ORAL | Status: DC | PRN
  Administered 2022-01-20 – 2022-01-21 (×3): 650 mg via ORAL
  Filled 2022-01-20 (×3): qty 2

## 2022-01-20 MED ORDER — AMLODIPINE BESYLATE 5 MG PO TABS: 5.0000 mg | ORAL_TABLET | Freq: Every day | ORAL | Status: DC

## 2022-01-20 MED ORDER — SPIRONOLACTONE 25 MG PO TABS
25.0000 mg | ORAL_TABLET | Freq: Every day | ORAL | Status: DC
  Filled 2022-01-20: qty 1

## 2022-01-20 MED ORDER — INSULIN GLARGINE-YFGN 100 UNIT/ML ~~LOC~~ SOLN
15.0000 [IU] | Freq: Every day | SUBCUTANEOUS | Status: DC
  Administered 2022-01-20 – 2022-01-22 (×3): 15 [IU] via SUBCUTANEOUS
  Filled 2022-01-20 (×3): qty 0.15

## 2022-01-20 MED ORDER — FERROUS SULFATE 325 (65 FE) MG PO TABS
325.0000 mg | ORAL_TABLET | Freq: Two times a day (BID) | ORAL | Status: DC
  Administered 2022-01-20 – 2022-01-24 (×10): 325 mg via ORAL
  Filled 2022-01-20 (×10): qty 1

## 2022-01-20 MED ORDER — IOHEXOL 350 MG/ML SOLN
75.0000 mL | Freq: Once | INTRAVENOUS | Status: AC | PRN
  Administered 2022-01-20: 75 mL via INTRAVENOUS

## 2022-01-20 MED ORDER — ASPIRIN EC 81 MG PO TBEC
81.0000 mg | DELAYED_RELEASE_TABLET | Freq: Every day | ORAL | Status: DC
  Administered 2022-01-20 – 2022-01-24 (×5): 81 mg via ORAL
  Filled 2022-01-20 (×5): qty 1

## 2022-01-20 MED ORDER — INSULIN ASPART 100 UNIT/ML IJ SOLN
0.0000 [IU] | Freq: Three times a day (TID) | INTRAMUSCULAR | Status: DC
  Administered 2022-01-20 – 2022-01-21 (×4): 3 [IU] via SUBCUTANEOUS
  Administered 2022-01-21: 2 [IU] via SUBCUTANEOUS

## 2022-01-20 MED ORDER — HYDRALAZINE HCL 25 MG PO TABS: 25.0000 mg | ORAL_TABLET | Freq: Three times a day (TID) | ORAL | Status: DC

## 2022-01-20 MED ORDER — LABETALOL HCL 5 MG/ML IV SOLN
10.0000 mg | INTRAVENOUS | Status: DC | PRN
  Administered 2022-01-21 – 2022-01-24 (×5): 10 mg via INTRAVENOUS
  Filled 2022-01-20 (×5): qty 4

## 2022-01-20 MED ORDER — ACETAMINOPHEN 500 MG PO TABS
1000.0000 mg | ORAL_TABLET | Freq: Once | ORAL | Status: AC
  Administered 2022-01-20: 1000 mg via ORAL
  Filled 2022-01-20: qty 2

## 2022-01-20 MED ORDER — ACETAMINOPHEN 650 MG RE SUPP: 650.0000 mg | Freq: Four times a day (QID) | RECTAL | Status: DC | PRN

## 2022-01-20 MED ORDER — LACTATED RINGERS IV SOLN: INTRAVENOUS | Status: DC

## 2022-01-20 MED ORDER — ENOXAPARIN SODIUM 40 MG/0.4ML IJ SOSY
40.0000 mg | PREFILLED_SYRINGE | Freq: Every day | INTRAMUSCULAR | Status: DC
  Administered 2022-01-20 – 2022-01-24 (×5): 40 mg via SUBCUTANEOUS
  Filled 2022-01-20 (×5): qty 0.4

## 2022-01-20 MED ORDER — LOSARTAN POTASSIUM 50 MG PO TABS: 50.0000 mg | ORAL_TABLET | Freq: Every day | ORAL | Status: DC

## 2022-01-20 NOTE — Plan of Care (Signed)
See full H&P for same day admission.  ?Patient seen and rounded on this morning in the ER. ?She presented with fevers, chills, cough, and sputum production.  Imaging revealed dense right-sided pneumonia.  She is a non-smoker with no oxygen requirement at home prior to admission.  She was noted to be on 3.5 L oxygen when seen in the morning.  We discussed obtaining CTA chest to rule out PE.  This was negative for PE and showed dense consolidation involving right upper lobe with mild right hilar and right paratracheal adenopathy.  Imaging recommended repeat CT or CXR in 4 to 6 weeks to rule out any underlying pulmonary nodules that were difficult to exclude on CTA chest. ?Plan is to continue antibiotics and wean oxygen over the next 24 to 48 hours.  ?Follow-up sputum culture. ? ?Remainder of plan as per H&P ? ?Dwyane Dee, MD ?[facility] ?01/20/2022, 12:20 PM ?

## 2022-01-20 NOTE — ED Notes (Signed)
Pt's O2 dropping into 80s. Pt moved up to 6L [place]. Provider notified.  ?

## 2022-01-20 NOTE — Progress Notes (Signed)
Inpatient Diabetes Program Recommendations ? ?AACE/ADA: New Consensus Statement on Inpatient Glycemic Control (2015) ? ?Target Ranges:  Prepandial:   less than 140 mg/dL ?     Peak postprandial:   less than 180 mg/dL (1-2 hours) ?     Critically ill patients:  140 - 180 mg/dL  ? ?Lab Results  ?Component Value Date  ? GLUCAP 219 (H) 01/20/2022  ? HGBA1C 9.3 (H) 01/20/2022  ? ? ?Review of Glycemic Control ? ?Diabetes history: DM2 ?Outpatient Diabetes medications: Glucotrol 10 mg bid, Metformin 1 gm bid ?Current orders for Inpatient glycemic control: Semglee 15 units, Novolog 0-9 units tid ? ?Inpatient Diabetes Program Recommendations:   ?Noted patient's A1c has decreased from 11.9 @ office visit 08/25/22 to currently 9.3 so has improved. ? ?Will follow patient during hospitalization. ? ?Thank you, ?Nani Gasser Ardon Franklin, RN, MSN, CDE  ?Diabetes Coordinator ?Inpatient Glycemic Control Team ?Team Pager [phone] (8am-5pm) ?01/20/2022 9:57 AM ? ? ? ? ?

## 2022-01-20 NOTE — ED Notes (Signed)
Pt assissted onto bedpan. Pt cleaned. Full linen change. Pt placed in gown and back in bed. Pt is resting comfortably.  ?

## 2022-01-20 NOTE — Plan of Care (Signed)

## 2022-01-20 NOTE — ED Notes (Signed)
Breakfast order placed ?

## 2022-01-20 NOTE — ED Notes (Signed)
Transported to CT 

## 2022-01-20 NOTE — H&P (Addendum)
?History and Physical  ? ? ?AMIR GLAUS [facility]:[id number] DOB: Apr 08, 1966 DOA: 01/19/2022 ? ?PCP: Vevelyn Francois, NP  ?Patient coming from: Home. ? ?Chief Complaint: Fever and chills. ? ?HPI: Claudia [name] is a 56 y.o. female with history of diabetes mellitus type 2, hypertension, chronic systolic CHF, history of PSVT presents to the ER after patient started having fever and chills with rigors at work.  Patient states over the last 3 weeks she has not been doing well with generalized body ache and cough.  But over the last 24 hours her symptoms worsen.  Denies any nausea vomiting abdominal pain or chest pain. ? ?ED Course: In the ER patient was febrile tachycardic and hypotensive concerning for sepsis.  Lab work showed leukocytosis lactic acid was normal.  Blood glucose was 391 creatinine 1.05.  Chest x-ray shows concerning features for pneumonia.  Patient started on empiric antibiotic admitted for sepsis secondary to community-acquired pneumonia.  COVID test and flu test were negative. ? ?Review of Systems: As per HPI, rest all negative. ? ? ?Past Medical History:  ?Diagnosis Date  ? Diabetes mellitus   ? Fibroids   ? Headache(784.0)   ? History of echocardiogram   ? Echo 7/18: Moderate concentric LVH, EF 55-60, normal wall motion, grade 1 diastolic dysfunction, calcified aortic leaflets  ? Hypercholesterolemia   ? Hyperlipidemia   ? Hypertension   ? Osteoarthritis   ? , with mild meniscus tear, followed by orthopedics--Dr Gladstone Lighter 04/29/10  ? Right knee pain   ? Seizures ([place])   ? as child  ? STEMI (ST elevation myocardial infarction) ([place])   ? Vitamin D deficiency   ? ? ?Past Surgical History:  ?Procedure Laterality Date  ? ANTERIOR CERVICAL DECOMP/DISCECTOMY FUSION N/A 09/07/2013  ? Procedure: ANTERIOR CERVICAL DECOMPRESSION/DISCECTOMY FUSION 1 LEVEL;  Surgeon: Sinclair Ship, MD;  Location: [place];  Service: Orthopedics;  Laterality: N/A;  Anterior cervical decompression fusion, cervical 5-6 with  instrumentation and allograft  ? LAPAROSCOPIC ASSISTED VAGINAL HYSTERECTOMY  1/14  ? LEFT HEART CATH AND CORONARY ANGIOGRAPHY N/A 04/25/2018  ? Procedure: LEFT HEART CATH AND CORONARY ANGIOGRAPHY;  Surgeon: Lorretta Harp, MD;  Location: [place] [facility];  Service: Cardiovascular;  Laterality: N/A;  ? SHOULDER SURGERY Right 11  ? rotator cuff  ? TUBAL LIGATION    ? UMBILICAL HERNIA REPAIR    ? ? ? reports that she has never smoked. She has never used smokeless tobacco. She reports that she does not drink alcohol and does not use drugs. ? ?Allergies  ?Allergen Reactions  ? Crestor [Rosuvastatin]   ?  MUSCLE ACHES HEADACHES ?  ? Hydrocodone Itching  ? Other Hives, Swelling and Other (See Comments)  ?  Vision changes; ALLERGIC TO ALL NUTS EXCEPT PEANUTS  ? Oxycodone Itching  ? Pravachol [Pravastatin Sodium] Itching  ?  Joint pain  ? Simvastatin Hives  ? ? ?Family History  ?Problem Relation Age of Onset  ? Hypertension Mother   ? Diabetes Mother   ? Hypercholesterolemia Mother   ? Hypertension Sister   ? CVA Maternal Grandmother   ? Hypertension Maternal Grandmother   ? Diabetes Maternal Grandmother   ? Heart attack Neg Hx   ? ? ?Prior to Admission medications   ?Medication Sig Start Date End Date Taking? Authorizing Provider  ?acetaminophen (TYLENOL) 500 MG tablet Take 500 mg by mouth every 6 (six) hours as needed for mild pain or moderate pain.   Yes [provider]  ?  amLODipine (NORVASC) 5 MG tablet Take 1 tablet (5 mg total) by mouth daily. 08/25/21 08/25/22 Yes Vevelyn Francois, NP  ?aspirin 81 MG EC tablet Take 1 tablet (81 mg total) by mouth daily. 10/04/19  Yes Azzie Glatter, FNP  ?diltiazem (CARDIZEM CD) 120 MG 24 hr capsule Take 1 capsule (120 mg total) by mouth daily. 10/17/21  Yes Jerline Pain, MD  ?ferrous sulfate 325 (65 FE) MG tablet Take 1 tablet (325 mg total) by mouth 2 (two) times daily. 07/01/21  Yes Vevelyn Francois, NP  ?glipiZIDE (GLUCOTROL) 10 MG tablet Take 1 tablet (10 mg total) by  mouth 2 (two) times daily before a meal. 08/25/21 08/25/22 Yes Vevelyn Francois, NP  ?hydrALAZINE (APRESOLINE) 25 MG tablet Take 1 tablet (25 mg total) by mouth every 8 (eight) hours. 08/25/21  Yes Vevelyn Francois, NP  ?hydrochlorothiazide (HYDRODIURIL) 25 MG tablet Take 1 tablet (25 mg total) by mouth daily. 08/25/21 08/25/22 Yes Vevelyn Francois, NP  ?losartan (COZAAR) 100 MG tablet Take 0.5 tablets (50 mg total) by mouth daily. 08/25/21 08/25/22 Yes Vevelyn Francois, NP  ?metFORMIN (GLUCOPHAGE) 1000 MG tablet Take 1 tablet (1,000 mg total) by mouth 2 (two) times daily with a meal. 08/25/21  Yes Vevelyn Francois, NP  ?Omega-3 Fatty Acids (FISH OIL) 1000 MG CAPS Take by mouth.    Yes [provider]  ?spironolactone (ALDACTONE) 25 MG tablet Take 1 tablet (25 mg total) by mouth daily. 08/25/21 08/25/22 Yes Vevelyn Francois, NP  ?blood glucose meter kit and supplies KIT Dispense based on patient and insurance preference. Use up to four times daily as directed. (FOR ICD-9 250.00, 250.01). 10/03/19   Azzie Glatter, FNP  ?glucose blood (ONETOUCH VERIO) test strip Use 1 strip to check blood glucose 4 (four) times daily. 08/25/21 08/25/22  Vevelyn Francois, NP  ?Vitamin D, Ergocalciferol, (DRISDOL) 1.25 MG (50000 UT) CAPS capsule Take 1 capsule (50,000 Units total) by mouth every 7 (seven) days. ?Patient not taking: Reported on 01/20/2022 10/04/19   Azzie Glatter, FNP  ? ? ?Physical Exam: ?Constitutional: Moderately built and nourished. ?Vitals:  ? 01/20/22 0300 01/20/22 0330 01/20/22 0400 01/20/22 0445  ?BP: 136/89 100/73 119/82 119/83  ?Pulse: (!) 103 (!) 102 100 96  ?Resp: 18 20 (!) 26 20  ?Temp:      ?TempSrc:      ?SpO2: 94% 97% 100% 95%  ? ?Eyes: Anicteric no pallor. ?ENMT: No discharge from the ears eyes nose and mouth. ?Neck: No mass felt.  No neck rigidity. ?Respiratory: No rhonchi or crepitations.  ?Cardiovascular: S1-S2 heard. ?Abdomen: Soft nontender bowel sound present. ?Musculoskeletal: No  edema. ?Skin: No rash. ?Neurologic: Alert awake oriented time place and person.  Moves all extremities. ?Psychiatric: Appears normal.  Normal affect. ? ? ?Labs on Admission: I have personally reviewed following labs and imaging studies ? ?CBC: ?Recent Labs  ?Lab 01/19/22 ?2326  ?WBC 21.5*  ?NEUTROABS 18.7*  ?HGB 12.2  ?HCT 40.9  ?MCV 73.4*  ?PLT 384  ? ?Basic Metabolic Panel: ?Recent Labs  ?Lab 01/19/22 ?2326  ?NA 135  ?K 3.9  ?CL 98  ?CO2 26  ?GLUCOSE 391*  ?BUN 20  ?CREATININE 1.05*  ?CALCIUM 8.9  ? ?GFR: ?CrCl cannot be calculated (Unknown ideal weight.). ?Liver Function Tests: ?Recent Labs  ?Lab 01/19/22 ?2326  ?AST 20  ?ALT 22  ?ALKPHOS 53  ?BILITOT 0.7  ?PROT 7.8  ?ALBUMIN 3.8  ? ?No results for input(s): LIPASE,  AMYLASE in the last 168 hours. ?No results for input(s): AMMONIA in the last 168 hours. ?Coagulation Profile: ?Recent Labs  ?Lab 01/19/22 ?2326  ?INR 1.0  ? ?Cardiac Enzymes: ?No results for input(s): CKTOTAL, CKMB, CKMBINDEX, TROPONINI in the last 168 hours. ?BNP (last 3 results) ?No results for input(s): PROBNP in the last 8760 hours. ?HbA1C: ?No results for input(s): HGBA1C in the last 72 hours. ?CBG: ?Recent Labs  ?Lab 01/19/22 ?2328  ?GLUCAP 375*  ? ?Lipid Profile: ?No results for input(s): CHOL, HDL, LDLCALC, TRIG, CHOLHDL, LDLDIRECT in the last 72 hours. ?Thyroid Function Tests: ?No results for input(s): TSH, T4TOTAL, FREET4, T3FREE, THYROIDAB in the last 72 hours. ?Anemia Panel: ?No results for input(s): VITAMINB12, FOLATE, FERRITIN, TIBC, IRON, RETICCTPCT in the last 72 hours. ?Urine analysis: ?   ?Component Value Date/Time  ? [place] YELLOW 01/19/2022 2326  ? APPEARANCEUR HAZY (A) 01/19/2022 2326  ? LABSPEC >1.030 (H) 01/19/2022 2326  ? PHURINE 5.5 01/19/2022 2326  ? GLUCOSEU >=500 (A) 01/19/2022 2326  ? [place] NEGATIVE 01/19/2022 2326  ? [place] NEGATIVE 01/19/2022 2326  ? BILIRUBINUR modrate 10/04/2020 1603  ? [place] NEGATIVE 01/19/2022 2326  ? [place] NEGATIVE 01/19/2022 2326   ? UROBILINOGEN 0.2 10/04/2020 1603  ? UROBILINOGEN 0.2 10/13/2017 1530  ? NITRITE NEGATIVE 01/19/2022 2326  ? LEUKOCYTESUR TRACE (A) 01/19/2022 2326  ? ?Sepsis Labs: ?@[email or web address] (procalcitonin:4,lacticidven:4) ?) ?Recent R

## 2022-01-20 NOTE — Plan of Care (Signed)

## 2022-01-21 ENCOUNTER — Ambulatory Visit: Payer: Self-pay | Admitting: Nurse Practitioner

## 2022-01-21 DIAGNOSIS — E1159 Type 2 diabetes mellitus with other circulatory complications: Secondary | ICD-10-CM | POA: Diagnosis not present

## 2022-01-21 DIAGNOSIS — I10 Essential (primary) hypertension: Secondary | ICD-10-CM | POA: Diagnosis not present

## 2022-01-21 DIAGNOSIS — I252 Old myocardial infarction: Secondary | ICD-10-CM | POA: Diagnosis not present

## 2022-01-21 DIAGNOSIS — A419 Sepsis, unspecified organism: Secondary | ICD-10-CM | POA: Diagnosis not present

## 2022-01-21 LAB — CBC
HCT: 39.3 % (ref 36.0–46.0)
Hemoglobin: 11.6 g/dL — ABNORMAL LOW (ref 12.0–15.0)
MCH: 22.1 pg — ABNORMAL LOW (ref 26.0–34.0)
MCHC: 29.5 g/dL — ABNORMAL LOW (ref 30.0–36.0)
MCV: 75 fL — ABNORMAL LOW (ref 80.0–100.0)
Platelets: 321 10*3/uL (ref 150–400)
RBC: 5.24 MIL/uL — ABNORMAL HIGH (ref 3.87–5.11)
RDW: 16.3 % — ABNORMAL HIGH (ref 11.5–15.5)
WBC: 17.8 10*3/uL — ABNORMAL HIGH (ref 4.0–10.5)
nRBC: 0 % (ref 0.0–0.2)

## 2022-01-21 LAB — COMPREHENSIVE METABOLIC PANEL
ALT: 42 U/L (ref 0–44)
AST: 37 U/L (ref 15–41)
Albumin: 3.3 g/dL — ABNORMAL LOW (ref 3.5–5.0)
Alkaline Phosphatase: 63 U/L (ref 38–126)
Anion gap: 5 (ref 5–15)
BUN: 8 mg/dL (ref 6–20)
CO2: 33 mmol/L — ABNORMAL HIGH (ref 22–32)
Calcium: 8.9 mg/dL (ref 8.9–10.3)
Chloride: 101 mmol/L (ref 98–111)
Creatinine, Ser: 0.61 mg/dL (ref 0.44–1.00)
GFR, Estimated: >60 mL/min (ref >60)
Glucose, Bld: 176 mg/dL — ABNORMAL HIGH (ref 70–99)
Potassium: 4.1 mmol/L (ref 3.5–5.1)
Sodium: 139 mmol/L (ref 135–145)
Total Bilirubin: 0.5 mg/dL (ref 0.3–1.2)
Total Protein: 7.6 g/dL (ref 6.5–8.1)

## 2022-01-21 LAB — URINE CULTURE

## 2022-01-21 LAB — EXPECTORATED SPUTUM ASSESSMENT W GRAM STAIN, RFLX TO RESP C

## 2022-01-21 LAB — GLUCOSE, CAPILLARY
Glucose-Capillary: 165 mg/dL — ABNORMAL HIGH (ref 70–99)
Glucose-Capillary: 207 mg/dL — ABNORMAL HIGH (ref 70–99)
Glucose-Capillary: 223 mg/dL — ABNORMAL HIGH (ref 70–99)
Glucose-Capillary: 207 mg/dL — ABNORMAL HIGH (ref 70–99)
Glucose-Capillary: 223 mg/dL — ABNORMAL HIGH (ref 70–99)

## 2022-01-21 MED ORDER — HYDRALAZINE HCL 25 MG PO TABS
25.0000 mg | ORAL_TABLET | Freq: Three times a day (TID) | ORAL | Status: DC
  Administered 2022-01-21 – 2022-01-24 (×8): 25 mg via ORAL
  Filled 2022-01-21 (×8): qty 1

## 2022-01-21 MED ORDER — KETOROLAC TROMETHAMINE 15 MG/ML IJ SOLN
15.0000 mg | Freq: Four times a day (QID) | INTRAMUSCULAR | Status: DC | PRN
  Administered 2022-01-21 – 2022-01-24 (×6): 15 mg via INTRAVENOUS
  Filled 2022-01-21 (×6): qty 1

## 2022-01-21 MED ORDER — INSULIN ASPART 100 UNIT/ML IJ SOLN
0.0000 [IU] | Freq: Every day | INTRAMUSCULAR | Status: DC
  Administered 2022-01-21: 2 [IU] via SUBCUTANEOUS

## 2022-01-21 MED ORDER — ACETAMINOPHEN 325 MG PO TABS
650.0000 mg | ORAL_TABLET | Freq: Four times a day (QID) | ORAL | Status: DC
  Administered 2022-01-21 – 2022-01-25 (×13): 650 mg via ORAL
  Filled 2022-01-21 (×13): qty 2

## 2022-01-21 MED ORDER — SPIRONOLACTONE 25 MG PO TABS
25.0000 mg | ORAL_TABLET | Freq: Every day | ORAL | Status: DC
  Administered 2022-01-21 – 2022-01-24 (×4): 25 mg via ORAL
  Filled 2022-01-21 (×4): qty 1

## 2022-01-21 MED ORDER — HYDROMORPHONE HCL 2 MG PO TABS
2.0000 mg | ORAL_TABLET | ORAL | Status: DC | PRN
  Administered 2022-01-21: 2 mg via ORAL
  Filled 2022-01-21: qty 1

## 2022-01-21 MED ORDER — BENZONATATE 100 MG PO CAPS
100.0000 mg | ORAL_CAPSULE | Freq: Three times a day (TID) | ORAL | Status: DC
Start: 2022-01-21 — End: 2022-01-25
  Administered 2022-01-21 – 2022-01-24 (×12): 100 mg via ORAL
  Filled 2022-01-21 (×12): qty 1

## 2022-01-21 MED ORDER — GUAIFENESIN-DM 100-10 MG/5ML PO SYRP
10.0000 mL | ORAL_SOLUTION | Freq: Four times a day (QID) | ORAL | Status: DC | PRN
  Administered 2022-01-21 – 2022-01-25 (×10): 10 mL via ORAL
  Filled 2022-01-21 (×10): qty 10

## 2022-01-21 MED ORDER — DILTIAZEM HCL ER COATED BEADS 120 MG PO CP24
120.0000 mg | ORAL_CAPSULE | Freq: Every day | ORAL | Status: DC
  Administered 2022-01-21 – 2022-01-24 (×4): 120 mg via ORAL
  Filled 2022-01-21 (×4): qty 1

## 2022-01-21 MED ORDER — INSULIN ASPART 100 UNIT/ML IJ SOLN
0.0000 [IU] | Freq: Three times a day (TID) | INTRAMUSCULAR | Status: DC
  Administered 2022-01-22 (×2): 5 [IU] via SUBCUTANEOUS

## 2022-01-21 MED ORDER — TRAMADOL HCL 50 MG PO TABS
50.0000 mg | ORAL_TABLET | Freq: Two times a day (BID) | ORAL | Status: DC | PRN
  Administered 2022-01-21: 50 mg via ORAL
  Filled 2022-01-21: qty 1

## 2022-01-21 NOTE — Plan of Care (Signed)

## 2022-01-21 NOTE — Progress Notes (Signed)
MD paged to inform about patient headache that has reoccurred. Pain medicine not due yet. Patient aware. Ice applied to back of neck. ?Vitals check, BP elevated given PRN BP meds. Will recheck vitals in 1 hour.   ? ? ?Awaiting MD response. RN asked when they will be at bedside to see patient for the day. Patient and husband would like to speak with MD.  ?

## 2022-01-21 NOTE — Progress Notes (Signed)
Provider made aware this Morning about patient c/o 10/10 headache, worsens with cough. Tramadol order PRN and given. Headache has resolved.  ? ?Provider also made aware about patients persistent cough, patient coughing up tan sputum. Congested. Patient denies SOB at rest but when moving or talking patient become very SOB. Cough medicine ordered for patient.  ? ?Beginning of shift patient was on 5L stating 98-100%. Decreased to 2L. Patient stating 90-92%. With activity patient oxygen level decreases. Patient placed on 4L to keep patient >92%.  ? ?Per MD provider will be at bedside to see patient.  ?

## 2022-01-21 NOTE — Progress Notes (Signed)
?Progress Note ? ?Patient: Claudia [name] DOB: 08-18-1966  ?DOA: 01/19/2022  DOS: 01/21/2022  ?  ?Brief hospital course: ?Claudia [name] is a 56 y.o. female with a history of morbid obesity, HTN, combined CHF with recovered EF who presented to the ED 4/24 with fever and productive cough associated with headache found to have a dense inferior RUL pneumonia, admitted for sepsis and acute hypoxic respiratory failure due to CAP ? ?Assessment and Plan: ?Sepsis due to RUL CAP:  ?- Continue ceftriaxone 2g IV q24h ?- Monitor blood cultures (NGTD x1 day), sputum culture (sent, pending), strep urine antigen.  ?- Maximize antitussives ? ?Acute hypoxic respiratory failure due to CAP: CTA showed no PE. Negative flu, covid PCR.  ?- Wean from oxygen. Suspect an element of OSA, so suggest sleep study after resolution of acute illness.  ?- Repeat CXR in 4-6 weeks recommended as nodule, etc. is not ruled out.  ? ?Headache: Global, associated with cough. No aura, no meningismus.  ?- Tramadol improved pain incompletely. Discussed alternative opioids in addition to current tylenol (hoping to avoid NSAIDs with renal impairment). Pt opts to proceed with low dose hydromorphone prn (has had itching in the past with hydrocodone, oxycodone). Since renal function improved, would be ok to give low dose toradol. ? ?History of chronic HFrEF (2013, since recovered to normal systolic and diastolic function by echo 2019), HTN:  ?- Restart home hydralazine, spironolactone, norvasc ? ?NIDT2DM uncontrolled with hyperglycemia: HbA1c 9.3%.  ?- Continue glargine and SSI. Improving glycemic control but still elevated, will augment SSI to moderate, add HS coverage. ?- Hold [facility], metformin. ? ?AKI: Very mild. BP now rising. CrCl is improved to normal. ?- Ok to restart spironolactone. Will hold thiazide, losartan for now Likely to restart 4/27 if BP trends continue upward. ? ?History of NSTEMI: No current chest pain. Troponin normal at 13 x2.  ?-  Continue aspirin.   ? ?Microcytic anemia: Mild ?- Check iron stores, monitor CBC. Continue home po iron. ? ?Morbid obesity: Estimated body mass index is 45.19 kg/m? as calculated from the following: ?  Height as of this encounter: '5\' 8"'$  (1.727 m). ?  Weight as of this encounter: 134.8 kg. ? ?Subjective: Feels ill, cough is persistent, severe and causes headache that is 10/10. No neck stiffness. No chest pain reported. +dyspnea. Has been having chills. Down to 2L O2 this AM. ? ?Objective: ?Vitals:  ? 01/21/22 1140 01/21/22 1205 01/21/22 1308 01/21/22 1659  ?BP: (!) 150/100 (!) 162/103 (!) 160/96 (!) 157/109  ?Pulse: 95 96 87 97  ?Resp: (!) 23 20 (!) 23 (!) 24  ?Temp: 98.6 ?F (37 ?C) 98.5 ?F (36.9 ?C)  98.7 ?F (37.1 ?C)  ?TempSrc: Oral Oral  Oral  ?SpO2: 98% 96% 92% 95%  ?Weight:      ?Height:      ? ?Gen: 56 y.o. female in no distress ?Pulm: Nonlabored breathing supplemental oxygen. Right sided crackles at midzone. No wheezing. Tachypneic. ?CV: Regular rate and rhythm. No murmur, rub, or gallop. No JVD, no pitting dependent edema. ?GI: Abdomen soft, non-tender, non-distended, with normoactive bowel sounds.  ?Ext: Warm, no deformities ?Skin: No rashes, lesions or ulcers on visualized skin. ?Neuro: Alert and oriented. No focal neurological deficits. ?Psych: Judgement and insight appear fair. Mood euthymic & affect congruent. Behavior is appropriate.   ? ?Data Personally reviewed: ? ?CBC: ?Recent Labs  ?Lab 01/19/22 ?2326 01/20/22 ?0501 01/21/22 ?9935  ?WBC 21.5* 25.1* 17.8*  ?NEUTROABS 18.7*  --   --   ?  HGB 12.2 11.8* 11.6*  ?HCT 40.9 38.7 39.3  ?MCV 73.4* 73.2* 75.0*  ?PLT 384 385 321  ? ?Basic Metabolic Panel: ?Recent Labs  ?Lab 01/19/22 ?2326 01/20/22 ?0501 01/21/22 ?7322  ?NA 135  --  139  ?K 3.9  --  4.1  ?CL 98  --  101  ?CO2 26  --  33*  ?GLUCOSE 391*  --  176*  ?BUN 20  --  8  ?CREATININE 1.05* 0.85 0.61  ?CALCIUM 8.9  --  8.9  ? ?GFR: ?Estimated Creatinine Clearance: 117.1 mL/min (by C-G formula based on SCr  of 0.61 mg/dL). ?Liver Function Tests: ?Recent Labs  ?Lab 01/19/22 ?2326 01/21/22 ?0213  ?AST 20 37  ?ALT 22 42  ?ALKPHOS 53 63  ?BILITOT 0.7 0.5  ?PROT 7.8 7.6  ?ALBUMIN 3.8 3.3*  ? ?No results for input(s): LIPASE, AMYLASE in the last 168 hours. ?No results for input(s): AMMONIA in the last 168 hours. ?Coagulation Profile: ?Recent Labs  ?Lab 01/19/22 ?2326  ?INR 1.0  ? ?Cardiac Enzymes: ?No results for input(s): CKTOTAL, CKMB, CKMBINDEX, TROPONINI in the last 168 hours. ?BNP (last 3 results) ?No results for input(s): PROBNP in the last 8760 hours. ?HbA1C: ?Recent Labs  ?  01/20/22 ?0501  ?HGBA1C 9.3*  ? ?CBG: ?Recent Labs  ?Lab 01/20/22 ?1757 01/20/22 ?2013 01/21/22 ?0752 01/21/22 ?1138 01/21/22 ?1658  ?GLUCAP 206* 220* 165* 207* 223*  ? ?Lipid Profile: ?No results for input(s): CHOL, HDL, LDLCALC, TRIG, CHOLHDL, LDLDIRECT in the last 72 hours. ?Thyroid Function Tests: ?No results for input(s): TSH, T4TOTAL, FREET4, T3FREE, THYROIDAB in the last 72 hours. ?Anemia Panel: ?No results for input(s): VITAMINB12, FOLATE, FERRITIN, TIBC, IRON, RETICCTPCT in the last 72 hours. ?Urine analysis: ?   ?Component Value Date/Time  ? [place] YELLOW 01/19/2022 2326  ? APPEARANCEUR HAZY (A) 01/19/2022 2326  ? LABSPEC >1.030 (H) 01/19/2022 2326  ? PHURINE 5.5 01/19/2022 2326  ? GLUCOSEU >=500 (A) 01/19/2022 2326  ? [place] NEGATIVE 01/19/2022 2326  ? [place] NEGATIVE 01/19/2022 2326  ? BILIRUBINUR modrate 10/04/2020 1603  ? [place] NEGATIVE 01/19/2022 2326  ? [place] NEGATIVE 01/19/2022 2326  ? UROBILINOGEN 0.2 10/04/2020 1603  ? UROBILINOGEN 0.2 10/13/2017 1530  ? NITRITE NEGATIVE 01/19/2022 2326  ? LEUKOCYTESUR TRACE (A) 01/19/2022 2326  ? ?Recent Results (from the past 240 hour(s))  ?Resp Panel by RT-PCR (Flu A&B, Covid) Nasopharyngeal Swab     Status: None  ? Collection Time: 01/19/22 11:37 PM  ? Specimen: Nasopharyngeal Swab; Nasopharyngeal(NP) swabs in vial transport medium  ?Result Value Ref Range Status  ? SARS  Coronavirus 2 by RT PCR NEGATIVE NEGATIVE Final  ?  Comment: (NOTE) ?SARS-CoV-2 target nucleic acids are NOT DETECTED. ? ?The SARS-CoV-2 RNA is generally detectable in upper respiratory ?specimens during the acute phase of infection. The lowest ?concentration of SARS-CoV-2 viral copies this assay can detect is ?138 copies/mL. A negative result does not preclude SARS-Cov-2 ?infection and should not be used as the sole basis for treatment or ?other patient management decisions. A negative result may occur with  ?improper specimen collection/handling, submission of specimen other ?than nasopharyngeal swab, presence of viral mutation(s) within the ?areas targeted by this assay, and inadequate number of viral ?copies(<138 copies/mL). A negative result must be combined with ?clinical observations, patient history, and epidemiological ?information. The expected result is Negative. ? ?Fact Sheet for Patients:  ?EntrepreneurPulse.com.au ? ?Fact Sheet for Healthcare Providers:  ?IncredibleEmployment.be ? ?This test is no t yet approved or cleared by the [place]  States FDA and  ?has been authorized for detection and/or diagnosis of SARS-CoV-2 by ?FDA under an Emergency Use Authorization (EUA). This EUA will remain  ?in effect (meaning this test can be used) for the duration of the ?COVID-19 declaration under Section 564(b)(1) of the Act, 21 ?U.S.C.section 360bbb-3(b)(1), unless the authorization is terminated  ?or revoked sooner.  ? ? ?  ? Influenza A by PCR NEGATIVE NEGATIVE Final  ? Influenza B by PCR NEGATIVE NEGATIVE Final  ?  Comment: (NOTE) ?The Xpert Xpress SARS-CoV-2/FLU/RSV plus assay is intended as an aid ?in the diagnosis of influenza from Nasopharyngeal swab specimens and ?should not be used as a sole basis for treatment. Nasal washings and ?aspirates are unacceptable for Xpert Xpress SARS-CoV-2/FLU/RSV ?testing. ? ?Fact Sheet for  Patients: ?EntrepreneurPulse.com.au ? ?Fact Sheet for Healthcare Providers: ?IncredibleEmployment.be ? ?This test is not yet approved or cleared by the [place] and ?has been authorized for detection and/or diag

## 2022-01-22 DIAGNOSIS — I10 Essential (primary) hypertension: Secondary | ICD-10-CM | POA: Diagnosis not present

## 2022-01-22 DIAGNOSIS — E1159 Type 2 diabetes mellitus with other circulatory complications: Secondary | ICD-10-CM | POA: Diagnosis not present

## 2022-01-22 DIAGNOSIS — A419 Sepsis, unspecified organism: Secondary | ICD-10-CM | POA: Diagnosis not present

## 2022-01-22 DIAGNOSIS — I252 Old myocardial infarction: Secondary | ICD-10-CM | POA: Diagnosis not present

## 2022-01-22 LAB — GLUCOSE, CAPILLARY
Glucose-Capillary: 247 mg/dL — ABNORMAL HIGH (ref 70–99)
Glucose-Capillary: 202 mg/dL — ABNORMAL HIGH (ref 70–99)
Glucose-Capillary: 147 mg/dL — ABNORMAL HIGH (ref 70–99)
Glucose-Capillary: 235 mg/dL — ABNORMAL HIGH (ref 70–99)

## 2022-01-22 LAB — CBC
HCT: 36.1 % (ref 36.0–46.0)
Hemoglobin: 10.3 g/dL — ABNORMAL LOW (ref 12.0–15.0)
MCH: 21.4 pg — ABNORMAL LOW (ref 26.0–34.0)
MCHC: 28.5 g/dL — ABNORMAL LOW (ref 30.0–36.0)
MCV: 75.1 fL — ABNORMAL LOW (ref 80.0–100.0)
Platelets: 313 10*3/uL (ref 150–400)
RBC: 4.81 MIL/uL (ref 3.87–5.11)
RDW: 15.9 % — ABNORMAL HIGH (ref 11.5–15.5)
WBC: 11.4 10*3/uL — ABNORMAL HIGH (ref 4.0–10.5)
nRBC: 0 % (ref 0.0–0.2)

## 2022-01-22 MED ORDER — INSULIN ASPART 100 UNIT/ML IJ SOLN
0.0000 [IU] | Freq: Every day | INTRAMUSCULAR | Status: DC
  Administered 2022-01-22: 2 [IU] via SUBCUTANEOUS
  Administered 2022-01-24: 3 [IU] via SUBCUTANEOUS

## 2022-01-22 MED ORDER — AZITHROMYCIN 500 MG PO TABS
500.0000 mg | ORAL_TABLET | ORAL | Status: AC
  Administered 2022-01-22 – 2022-01-23 (×2): 500 mg via ORAL
  Filled 2022-01-22 (×2): qty 1

## 2022-01-22 MED ORDER — INSULIN ASPART 100 UNIT/ML IJ SOLN
0.0000 [IU] | Freq: Three times a day (TID) | INTRAMUSCULAR | Status: DC
  Administered 2022-01-22: 3 [IU] via SUBCUTANEOUS
  Administered 2022-01-23: 11 [IU] via SUBCUTANEOUS
  Administered 2022-01-23: 3 [IU] via SUBCUTANEOUS
  Administered 2022-01-23: 4 [IU] via SUBCUTANEOUS
  Administered 2022-01-24: 3 [IU] via SUBCUTANEOUS
  Administered 2022-01-24: 7 [IU] via SUBCUTANEOUS
  Administered 2022-01-24: 4 [IU] via SUBCUTANEOUS
  Administered 2022-01-25: 7 [IU] via SUBCUTANEOUS

## 2022-01-22 MED ORDER — HYDROCHLOROTHIAZIDE 25 MG PO TABS
25.0000 mg | ORAL_TABLET | Freq: Every day | ORAL | Status: DC
Start: 2022-01-22 — End: 2022-01-25
  Administered 2022-01-22 – 2022-01-24 (×3): 25 mg via ORAL
  Filled 2022-01-22 (×3): qty 1

## 2022-01-22 MED ORDER — INSULIN GLARGINE-YFGN 100 UNIT/ML ~~LOC~~ SOLN
20.0000 [IU] | Freq: Every day | SUBCUTANEOUS | Status: DC
  Administered 2022-01-23 – 2022-01-24 (×2): 20 [IU] via SUBCUTANEOUS
  Filled 2022-01-22 (×4): qty 0.2

## 2022-01-22 MED ORDER — SALINE SPRAY 0.65 % NA SOLN
1.0000 | NASAL | Status: DC | PRN
  Filled 2022-01-22: qty 44

## 2022-01-22 MED ORDER — LOSARTAN POTASSIUM 50 MG PO TABS
50.0000 mg | ORAL_TABLET | Freq: Every day | ORAL | Status: DC
Start: 2022-01-22 — End: 2022-01-25
  Administered 2022-01-22 – 2022-01-24 (×3): 50 mg via ORAL
  Filled 2022-01-22 (×3): qty 1

## 2022-01-22 MED ORDER — INSULIN GLARGINE-YFGN 100 UNIT/ML ~~LOC~~ SOLN
5.0000 [IU] | Freq: Once | SUBCUTANEOUS | Status: AC
  Administered 2022-01-22: 5 [IU] via SUBCUTANEOUS
  Filled 2022-01-22: qty 0.05

## 2022-01-22 MED ORDER — AMLODIPINE BESYLATE 5 MG PO TABS
5.0000 mg | ORAL_TABLET | Freq: Every day | ORAL | Status: DC
  Administered 2022-01-22 – 2022-01-23 (×2): 5 mg via ORAL
  Filled 2022-01-22 (×2): qty 1

## 2022-01-22 NOTE — Plan of Care (Signed)

## 2022-01-22 NOTE — Progress Notes (Signed)
Mobility Specialist Progress Note  ? ? 01/22/22 1411  ?Mobility  ?Activity Ambulated with assistance in room  ?Level of Assistance Standby assist, set-up cues, supervision of patient - no hands on  ?Assistive Device Front wheel walker  ?Distance Ambulated (ft) 65 ft  ?Activity Response Tolerated fair  ?$Mobility charge 1 Mobility  ? ?Pt received in chair and agreeable. Wanted to attempt on 1LO2, encouraged pursed lip breathing. Maintained Spo2 of 89-92%. C/o a little wooziness. Returned to chair with 2LO2 and call bell in reach.  ? ?Hildred Alamin ?Mobility Specialist  ?Primary: 5[address] M.S. Phone: [phone] ?Secondary: 6[address] M[address] Phone: [phone] ?  ?

## 2022-01-22 NOTE — Progress Notes (Signed)
Inpatient Diabetes Program Recommendations ? ?AACE/ADA: New Consensus Statement on Inpatient Glycemic Control (2015) ? ?Target Ranges:  Prepandial:   less than 140 mg/dL ?     Peak postprandial:   less than 180 mg/dL (1-2 hours) ?     Critically ill patients:  140 - 180 mg/dL  ? ?Lab Results  ?Component Value Date  ? GLUCAP 247 (H) 01/22/2022  ? HGBA1C 9.3 (H) 01/20/2022  ? ? ?Review of Glycemic Control ? Latest Reference Range & Units 01/21/22 19:41 01/21/22 19:48 01/22/22 08:16  ?Glucose-Capillary 70 - 99 mg/dL 207 (H) 223 (H) 247 (H)  ? ?Diabetes history: DM 2 ?Outpatient Diabetes medications:  ?Glucotrol 10 mg bid ?Metformin 1000 mg bid ?Current orders for Inpatient glycemic control:  ?Novolog 0-15 units tid with meals and HS ?Semglee 15 units daily ? ?Inpatient Diabetes Program Recommendations:   ? ?Please consider increasing Semglee to 20 units daily. Patient has had improvement in A1C since last PCP office visit.  ? ?Thanks,  ?Adah Perl, RN, BC-ADM ?Inpatient Diabetes Coordinator ?Pager [phone]  (8a-5p) ? ? ? ?

## 2022-01-22 NOTE — Progress Notes (Signed)
?Progress Note ? ?Patient: Claudia [name] YCX:[id number] DOB: 25-Apr-1966  ?DOA: 01/19/2022  DOS: 01/22/2022  ?  ?Brief hospital course: ?Claudia [name] is a 56 y.o. female with a history of morbid obesity, HTN, combined CHF with recovered EF who presented to the ED 4/24 with fever and productive cough associated with headache found to have a dense inferior RUL pneumonia, admitted for sepsis and acute hypoxic respiratory failure due to CAP ? ?Assessment and Plan: ?Sepsis due to RUL CAP: WBC responding, clinically stabilizing.  ?- Continue ceftriaxone 2g IV q24h, azithromycin to complete 5 days.  ?- Monitor blood cultures (NG2D), sputum culture (not good specimen), strep urine antigen.  ?- Maximize antitussives ? ?Acute hypoxic respiratory failure due to CAP: CTA showed no PE. Negative flu, covid PCR.  ?- Wean from oxygen with treatment for PNA continues as above.  ?- Suspect an element of OSA, so suggest sleep study after resolution of acute illness.  ?- Repeat CXR in 4-6 weeks recommended as nodule, etc. is not ruled out.  ? ?Hemoptysis: Mild, may also alternatively be epistaxis but is currently resolved.  ?- IN saline spray ?- Monitor CBC and clinically. ?- Discussed risks, alternatives, and rationale for lovenox VTE ppx which the patient consents to continuing.  ? ?Headache: Global, associated with cough. No aura, no meningismus.  ?- Continue antitussives and low dose hydromorphone prn. ? ?History of chronic HFrEF (2013, since recovered to normal systolic and diastolic function by echo 2019), resistant HTN:  ?- Continue home meds including hydralazine, spironolactone, norvasc, diltiazem, losartan, thiazide. ? ?NIDT2DM uncontrolled with hyperglycemia: HbA1c 9.3%.  ?- Continue glargine, augment dose. Continue SSI, change to resistant and continue HS coverage.   ?- Hold [facility], metformin. ? ?AKI: Very mild. BP now rising. CrCl is improved to normal. ?- Ok to restart spironolactone. Restart thiazide, losartan   ? ?History  of NSTEMI: No current chest pain. Troponin normal at 13 x2.  ?- Continue aspirin.   ? ?Microcytic anemia: Mild ?- Check iron stores, monitor CBC. Continue home po iron. ? ?Morbid obesity: Estimated body mass index is 45.19 kg/m? as calculated from the following: ?  Height as of this encounter: '5\' 8"'$  (1.727 m). ?  Weight as of this encounter: 134.8 kg. ? ?Subjective: Up to recliner today, feeling better but still short of breath with any exertion, still coughing persistently with associated pain in the back and front of the head. No chest pain or neck stiffness reported.  ? ?Objective: ?Vitals:  ? 01/22/22 0433 01/22/22 0816 01/22/22 0855 01/22/22 1021  ?BP: (!) 149/85 (!) 154/88 (!) 150/92 (!) 160/92  ?Pulse: 87 81 75   ?Resp: '19 18 17   '$ ?Temp: 98.2 ?F (36.8 ?C) 98.3 ?F (36.8 ?C) 98.1 ?F (36.7 ?C)   ?TempSrc: Oral Oral Oral   ?SpO2: 95% 96% 96%   ?Weight:      ?Height:      ? ?Gen: 56 y.o. female in no distress ?Pulm: Nonlabored breathing supplemental oxygen, crackles on right, good air movement, no wheezes ?CV: Regular rate and rhythm. No murmur, rub, or gallop. No JVD, no dependent edema. ?GI: Abdomen soft, non-tender, non-distended, with normoactive bowel sounds.  ?Ext: Warm, no deformities ?Skin: No rashes, lesions or ulcers on visualized skin. ?Neuro: Alert and oriented. No focal neurological deficits. No nuchal rigidity.  ?Psych: Judgement and insight appear fair. Mood euthymic & affect congruent. Behavior is appropriate. ? ?Data Personally reviewed: ? ?CBC: ?Recent Labs  ?Lab 01/19/22 ?2326 01/20/22 ?4970 01/21/22 ?2637  01/22/22 ?0440  ?WBC 21.5* 25.1* 17.8* 11.4*  ?NEUTROABS 18.7*  --   --   --   ?HGB 12.2 11.8* 11.6* 10.3*  ?HCT 40.9 38.7 39.3 36.1  ?MCV 73.4* 73.2* 75.0* 75.1*  ?PLT 384 385 321 313  ? ?Basic Metabolic Panel: ?Recent Labs  ?Lab 01/19/22 ?2326 01/20/22 ?0501 01/21/22 ?0160  ?NA 135  --  139  ?K 3.9  --  4.1  ?CL 98  --  101  ?CO2 26  --  33*  ?GLUCOSE 391*  --  176*  ?BUN 20  --  8   ?CREATININE 1.05* 0.85 0.61  ?CALCIUM 8.9  --  8.9  ? ?GFR: ?Estimated Creatinine Clearance: 117.1 mL/min (by C-G formula based on SCr of 0.61 mg/dL). ?Liver Function Tests: ?Recent Labs  ?Lab 01/19/22 ?2326 01/21/22 ?0213  ?AST 20 37  ?ALT 22 42  ?ALKPHOS 53 63  ?BILITOT 0.7 0.5  ?PROT 7.8 7.6  ?ALBUMIN 3.8 3.3*  ? ?No results for input(s): LIPASE, AMYLASE in the last 168 hours. ?No results for input(s): AMMONIA in the last 168 hours. ?Coagulation Profile: ?Recent Labs  ?Lab 01/19/22 ?2326  ?INR 1.0  ? ?Cardiac Enzymes: ?No results for input(s): CKTOTAL, CKMB, CKMBINDEX, TROPONINI in the last 168 hours. ?BNP (last 3 results) ?No results for input(s): PROBNP in the last 8760 hours. ?HbA1C: ?Recent Labs  ?  01/20/22 ?0501  ?HGBA1C 9.3*  ? ?CBG: ?Recent Labs  ?Lab 01/21/22 ?1658 01/21/22 ?1941 01/21/22 ?1948 01/22/22 ?1093 01/22/22 ?1112  ?GLUCAP 223* 207* 223* 247* 202*  ? ?Lipid Profile: ?No results for input(s): CHOL, HDL, LDLCALC, TRIG, CHOLHDL, LDLDIRECT in the last 72 hours. ?Thyroid Function Tests: ?No results for input(s): TSH, T4TOTAL, FREET4, T3FREE, THYROIDAB in the last 72 hours. ?Anemia Panel: ?No results for input(s): VITAMINB12, FOLATE, FERRITIN, TIBC, IRON, RETICCTPCT in the last 72 hours. ?Urine analysis: ?   ?Component Value Date/Time  ? [place] YELLOW 01/19/2022 2326  ? APPEARANCEUR HAZY (A) 01/19/2022 2326  ? LABSPEC >1.030 (H) 01/19/2022 2326  ? PHURINE 5.5 01/19/2022 2326  ? GLUCOSEU >=500 (A) 01/19/2022 2326  ? [place] NEGATIVE 01/19/2022 2326  ? [place] NEGATIVE 01/19/2022 2326  ? BILIRUBINUR modrate 10/04/2020 1603  ? [place] NEGATIVE 01/19/2022 2326  ? [place] NEGATIVE 01/19/2022 2326  ? UROBILINOGEN 0.2 10/04/2020 1603  ? UROBILINOGEN 0.2 10/13/2017 1530  ? NITRITE NEGATIVE 01/19/2022 2326  ? LEUKOCYTESUR TRACE (A) 01/19/2022 2326  ? ?Recent Results (from the past 240 hour(s))  ?Resp Panel by RT-PCR (Flu A&B, Covid) Nasopharyngeal Swab     Status: None  ? Collection Time: 01/19/22  11:37 PM  ? Specimen: Nasopharyngeal Swab; Nasopharyngeal(NP) swabs in vial transport medium  ?Result Value Ref Range Status  ? SARS Coronavirus 2 by RT PCR NEGATIVE NEGATIVE Final  ?  Comment: (NOTE) ?SARS-CoV-2 target nucleic acids are NOT DETECTED. ? ?The SARS-CoV-2 RNA is generally detectable in upper respiratory ?specimens during the acute phase of infection. The lowest ?concentration of SARS-CoV-2 viral copies this assay can detect is ?138 copies/mL. A negative result does not preclude SARS-Cov-2 ?infection and should not be used as the sole basis for treatment or ?other patient management decisions. A negative result may occur with  ?improper specimen collection/handling, submission of specimen other ?than nasopharyngeal swab, presence of viral mutation(s) within the ?areas targeted by this assay, and inadequate number of viral ?copies(<138 copies/mL). A negative result must be combined with ?clinical observations, patient history, and epidemiological ?information. The expected result is Negative. ? ?Fact Sheet  for Patients:  ?EntrepreneurPulse.com.au ? ?Fact Sheet for Healthcare Providers:  ?IncredibleEmployment.be ? ?This test is no t yet approved or cleared by the [place] FDA and  ?has been authorized for detection and/or diagnosis of SARS-CoV-2 by ?FDA under an Emergency Use Authorization (EUA). This EUA will remain  ?in effect (meaning this test can be used) for the duration of the ?COVID-19 declaration under Section 564(b)(1) of the Act, 21 ?U.S.C.section 360bbb-3(b)(1), unless the authorization is terminated  ?or revoked sooner.  ? ? ?  ? Influenza A by PCR NEGATIVE NEGATIVE Final  ? Influenza B by PCR NEGATIVE NEGATIVE Final  ?  Comment: (NOTE) ?The Xpert Xpress SARS-CoV-2/FLU/RSV plus assay is intended as an aid ?in the diagnosis of influenza from Nasopharyngeal swab specimens and ?should not be used as a sole basis for treatment. Nasal washings and ?aspirates  are unacceptable for Xpert Xpress SARS-CoV-2/FLU/RSV ?testing. ? ?Fact Sheet for Patients: ?EntrepreneurPulse.com.au ? ?Fact Sheet for Healthcare Providers: ?https://wagner.biz/

## 2022-01-23 DIAGNOSIS — I252 Old myocardial infarction: Secondary | ICD-10-CM | POA: Diagnosis not present

## 2022-01-23 DIAGNOSIS — E1159 Type 2 diabetes mellitus with other circulatory complications: Secondary | ICD-10-CM | POA: Diagnosis not present

## 2022-01-23 DIAGNOSIS — A419 Sepsis, unspecified organism: Secondary | ICD-10-CM | POA: Diagnosis not present

## 2022-01-23 DIAGNOSIS — I10 Essential (primary) hypertension: Secondary | ICD-10-CM | POA: Diagnosis not present

## 2022-01-23 LAB — CBC
HCT: 36.7 % (ref 36.0–46.0)
Hemoglobin: 10.6 g/dL — ABNORMAL LOW (ref 12.0–15.0)
MCH: 21.4 pg — ABNORMAL LOW (ref 26.0–34.0)
MCHC: 28.9 g/dL — ABNORMAL LOW (ref 30.0–36.0)
MCV: 74 fL — ABNORMAL LOW (ref 80.0–100.0)
Platelets: 361 K/uL (ref 150–400)
RBC: 4.96 MIL/uL (ref 3.87–5.11)
RDW: 15.9 % — ABNORMAL HIGH (ref 11.5–15.5)
WBC: 9.3 K/uL (ref 4.0–10.5)
nRBC: 0 % (ref 0.0–0.2)

## 2022-01-23 LAB — GLUCOSE, CAPILLARY
Glucose-Capillary: 292 mg/dL — ABNORMAL HIGH (ref 70–99)
Glucose-Capillary: 163 mg/dL — ABNORMAL HIGH (ref 70–99)
Glucose-Capillary: 135 mg/dL — ABNORMAL HIGH (ref 70–99)
Glucose-Capillary: 257 mg/dL — ABNORMAL HIGH (ref 70–99)
Glucose-Capillary: 171 mg/dL — ABNORMAL HIGH (ref 70–99)

## 2022-01-23 NOTE — Progress Notes (Signed)
Mobility Specialist Progress Note  ? ? 01/23/22 1418  ?Mobility  ?Activity Ambulated with assistance in hallway  ?Level of Assistance Standby assist, set-up cues, supervision of patient - no hands on  ?Assistive Device Front wheel walker  ?Distance Ambulated (ft) 320 ft  ?Activity Response Tolerated well  ?$Mobility charge 1 Mobility  ? ?Pt received in chair and agreeable. Attempted ambulation on RA and maintained SpO2 between 88-94% with pursed lip breathing. No complaints or breaks. Returned to chair with call bell in reach.  ? ?Hildred Alamin ?Mobility Specialist  ?Primary: [address] M[address] Phone: [phone] ?Secondary: 6N M.S. Phone: [phone] ?  ?

## 2022-01-23 NOTE — Progress Notes (Signed)
Mobility Specialist Progress Note  ? ? 01/23/22 1643  ?Mobility  ?Activity Ambulated with assistance to bathroom  ?Level of Assistance Contact guard assist, steadying assist  ?Assistive Device Front wheel walker  ?Distance Ambulated (ft) 24 ft ?(12+12)  ?Activity Response Tolerated well  ?$Mobility charge 1 Mobility  ? ?Left with call bell in reach in chair.  ? ?Hildred Alamin ?Mobility Specialist  ?Primary: 5[address] M.S. Phone: [phone] ?Secondary: 6[address] M.S. Phone: [phone] ?  ?

## 2022-01-23 NOTE — Progress Notes (Signed)
?Progress Note ? ?Patient: Claudia [name] QZR:[id number] DOB: 01/07/1966  ?DOA: 01/19/2022  DOS: 01/23/2022  ?  ?Brief hospital course: ?Claudia [name] is a 56 y.o. female with a history of morbid obesity, HTN, combined CHF with recovered EF who presented to the ED 4/24 with fever and productive cough associated with headache found to have a dense inferior RUL pneumonia, admitted for sepsis and acute hypoxic respiratory failure due to CAP. ? ?Assessment and Plan: ?Sepsis due to RUL CAP: WBC responding, clinically stabilizing.  ?- Continue ceftriaxone 2g IV q24h, azithromycin to complete 5 days.  ?- Monitor blood cultures (NG3D). Leukocytosis resolved, hemodynamics stable.  ? ?Acute hypoxic respiratory failure due to CAP: CTA showed no PE. Negative flu, covid PCR.  ?- Though overall continuing to improve, she remains severely short of breath with ambulation and hypoxic with exertion. Did not previously require supplemental oxygen. Will recheck ambulatory pulse oximetry in AM in preparation for discharge.  ?- Suspect an element of OSA, so suggest sleep study after resolution of acute illness.  ?- Repeat CXR in 4-6 weeks recommended as nodule, etc. is not ruled out.  ?- Maximize antitussives ? ?Hemoptysis: Currently resolved. CBC stable. ?- IN saline spray prn ? ?Headache: Global, associated with cough. No aura, no meningismus.  ?- Continue antitussives and low dose hydromorphone prn. ? ?History of chronic HFrEF (2013, since recovered to normal systolic and diastolic function by echo 2019), resistant HTN:  ?- Continue home meds including hydralazine, spironolactone, norvasc, diltiazem, losartan, thiazide. ? ?NIDT2DM uncontrolled with hyperglycemia: HbA1c 9.3%.  ?- Continue glargine 20u, resistant SSI. Improved.  ?- Hold [facility], metformin. ? ?AKI: Very mild. BP now rising. CrCl is improved to normal. ?- Restarted home spironolactone, thiazide, losartan   ? ?History of NSTEMI: No current chest pain. Troponin normal at 13 x2.   ?- Continue aspirin.   ? ?Iron deficiency anemia: Mild ?- Continue home po iron. ? ?Morbid obesity: Estimated body mass index is 45.19 kg/m? as calculated from the following: ?  Height as of this encounter: '5\' 8"'$  (1.727 m). ?  Weight as of this encounter: 134.8 kg. ? ?Subjective: Feeling more strength, able to walk farther day by day but still severely short of breath with limited exertion which is not her baseline. No chest pain. Cough persistent, associated with headache. No hemoptysis.  ? ?Objective: ?Vitals:  ? 01/23/22 0335 01/23/22 0406 01/23/22 0807 01/23/22 1419  ?BP: (!) 181/98 (!) 168/99 (!) 166/85 (!) 167/86  ?Pulse: 88 80 88 89  ?Resp: '20 20 18   '$ ?Temp: 98 ?F (36.7 ?C)  98 ?F (36.7 ?C) 98.8 ?F (37.1 ?C)  ?TempSrc: Oral  Oral Oral  ?SpO2: 96% 93% 91% 94%  ?Weight:      ?Height:      ? ?Gen: 56 y.o. female in no distress ?Pulm: Nonlabored breathing room air at rest. SpO2 hovering 89-92%. Clear. ?CV: Regular rate and rhythm. No murmur, rub, or gallop. No JVD, no pitting dependent edema. ?GI: Abdomen soft, non-tender, non-distended, with normoactive bowel sounds.  ?Ext: Warm, no deformities ?Skin: No rashes, lesions or ulcers on visualized skin. ?Neuro: Alert and oriented. No focal neurological deficits. ?Psych: Judgement and insight appear fair. Mood euthymic & affect congruent. Behavior is appropriate.   ? ?Data Personally reviewed: ? ?CBC: ?Recent Labs  ?Lab 01/19/22 ?2326 01/20/22 ?0501 01/21/22 ?3545 01/22/22 ?0440 01/23/22 ?0225  ?WBC 21.5* 25.1* 17.8* 11.4* 9.3  ?NEUTROABS 18.7*  --   --   --   --   ?  HGB 12.2 11.8* 11.6* 10.3* 10.6*  ?HCT 40.9 38.7 39.3 36.1 36.7  ?MCV 73.4* 73.2* 75.0* 75.1* 74.0*  ?PLT 384 385 321 313 361  ? ?Basic Metabolic Panel: ?Recent Labs  ?Lab 01/19/22 ?2326 01/20/22 ?0501 01/21/22 ?6599  ?NA 135  --  139  ?K 3.9  --  4.1  ?CL 98  --  101  ?CO2 26  --  33*  ?GLUCOSE 391*  --  176*  ?BUN 20  --  8  ?CREATININE 1.05* 0.85 0.61  ?CALCIUM 8.9  --  8.9  ? ?GFR: ?Estimated  Creatinine Clearance: 117.1 mL/min (by C-G formula based on SCr of 0.61 mg/dL). ?Liver Function Tests: ?Recent Labs  ?Lab 01/19/22 ?2326 01/21/22 ?0213  ?AST 20 37  ?ALT 22 42  ?ALKPHOS 53 63  ?BILITOT 0.7 0.5  ?PROT 7.8 7.6  ?ALBUMIN 3.8 3.3*  ? ?No results for input(s): LIPASE, AMYLASE in the last 168 hours. ?No results for input(s): AMMONIA in the last 168 hours. ?Coagulation Profile: ?Recent Labs  ?Lab 01/19/22 ?2326  ?INR 1.0  ? ?Cardiac Enzymes: ?No results for input(s): CKTOTAL, CKMB, CKMBINDEX, TROPONINI in the last 168 hours. ?BNP (last 3 results) ?No results for input(s): PROBNP in the last 8760 hours. ?HbA1C: ?No results for input(s): HGBA1C in the last 72 hours. ? ?CBG: ?Recent Labs  ?Lab 01/22/22 ?1606 01/22/22 ?1932 01/23/22 ?0804 01/23/22 ?1107 01/23/22 ?1532  ?GLUCAP 147* 235* 292* 163* 135*  ? ?Lipid Profile: ?No results for input(s): CHOL, HDL, LDLCALC, TRIG, CHOLHDL, LDLDIRECT in the last 72 hours. ?Thyroid Function Tests: ?No results for input(s): TSH, T4TOTAL, FREET4, T3FREE, THYROIDAB in the last 72 hours. ?Anemia Panel: ?No results for input(s): VITAMINB12, FOLATE, FERRITIN, TIBC, IRON, RETICCTPCT in the last 72 hours. ?Urine analysis: ?   ?Component Value Date/Time  ? [place] YELLOW 01/19/2022 2326  ? APPEARANCEUR HAZY (A) 01/19/2022 2326  ? LABSPEC >1.030 (H) 01/19/2022 2326  ? PHURINE 5.5 01/19/2022 2326  ? GLUCOSEU >=500 (A) 01/19/2022 2326  ? [place] NEGATIVE 01/19/2022 2326  ? [place] NEGATIVE 01/19/2022 2326  ? BILIRUBINUR modrate 10/04/2020 1603  ? [place] NEGATIVE 01/19/2022 2326  ? [place] NEGATIVE 01/19/2022 2326  ? UROBILINOGEN 0.2 10/04/2020 1603  ? UROBILINOGEN 0.2 10/13/2017 1530  ? NITRITE NEGATIVE 01/19/2022 2326  ? LEUKOCYTESUR TRACE (A) 01/19/2022 2326  ? ?Recent Results (from the past 240 hour(s))  ?Resp Panel by RT-PCR (Flu A&B, Covid) Nasopharyngeal Swab     Status: None  ? Collection Time: 01/19/22 11:37 PM  ? Specimen: Nasopharyngeal Swab; Nasopharyngeal(NP)  swabs in vial transport medium  ?Result Value Ref Range Status  ? SARS Coronavirus 2 by RT PCR NEGATIVE NEGATIVE Final  ?  Comment: (NOTE) ?SARS-CoV-2 target nucleic acids are NOT DETECTED. ? ?The SARS-CoV-2 RNA is generally detectable in upper respiratory ?specimens during the acute phase of infection. The lowest ?concentration of SARS-CoV-2 viral copies this assay can detect is ?138 copies/mL. A negative result does not preclude SARS-Cov-2 ?infection and should not be used as the sole basis for treatment or ?other patient management decisions. A negative result may occur with  ?improper specimen collection/handling, submission of specimen other ?than nasopharyngeal swab, presence of viral mutation(s) within the ?areas targeted by this assay, and inadequate number of viral ?copies(<138 copies/mL). A negative result must be combined with ?clinical observations, patient history, and epidemiological ?information. The expected result is Negative. ? ?Fact Sheet for Patients:  ?EntrepreneurPulse.com.au ? ?Fact Sheet for Healthcare Providers:  ?IncredibleEmployment.be ? ?This test is no t  yet approved or cleared by the [place] and  ?has been authorized for detection and/or diagnosis of SARS-CoV-2 by ?FDA under an Emergency Use Authorization (EUA). This EUA will remain  ?in effect (meaning this test can be used) for the duration of the ?COVID-19 declaration under Section 564(b)(1) of the Act, 21 ?U.S.C.section 360bbb-3(b)(1), unless the authorization is terminated  ?or revoked sooner.  ? ? ?  ? Influenza A by PCR NEGATIVE NEGATIVE Final  ? Influenza B by PCR NEGATIVE NEGATIVE Final  ?  Comment: (NOTE) ?The Xpert Xpress SARS-CoV-2/FLU/RSV plus assay is intended as an aid ?in the diagnosis of influenza from Nasopharyngeal swab specimens and ?should not be used as a sole basis for treatment. Nasal washings and ?aspirates are unacceptable for Xpert Xpress  SARS-CoV-2/FLU/RSV ?testing. ? ?Fact Sheet for Patients: ?EntrepreneurPulse.com.au ? ?Fact Sheet for Healthcare Providers: ?IncredibleEmployment.be ? ?This test is not yet approved or cleared by the [facility]

## 2022-01-23 NOTE — Progress Notes (Addendum)
Mobility Specialist Progress Note  ? ? 01/23/22 1108  ?Mobility  ?Activity Ambulated with assistance to bathroom  ?Level of Assistance Contact guard assist, steadying assist  ?Assistive Device Front wheel walker;None  ?Distance Ambulated (ft) 24 ft ?(12+12)  ?Activity Response Tolerated fair  ?$Mobility charge 1 Mobility  ? ?Pt received in chair and agreeable. C/o weakness. Attempted return to chair w/o RW, successful. Returned with call bell in reach. Will f/u as schedule permits.  ? ?Claudia [name] ?Mobility Specialist  ?Primary: 5[address] M.S. Phone: [phone] ?Secondary: 6[address] M[address] Phone: [phone] ?  ?

## 2022-01-23 NOTE — Progress Notes (Signed)
Sats down to 65-70% on RA when pt fell asleep.  O2 2 liters n/c applied and sats responded well up to mid 90's. ?Ayesha Mohair BSN RN CMSRN ?01/23/2022, 9:35 PM ? ?

## 2022-01-24 DIAGNOSIS — E1159 Type 2 diabetes mellitus with other circulatory complications: Secondary | ICD-10-CM | POA: Diagnosis not present

## 2022-01-24 DIAGNOSIS — I10 Essential (primary) hypertension: Secondary | ICD-10-CM | POA: Diagnosis not present

## 2022-01-24 DIAGNOSIS — I252 Old myocardial infarction: Secondary | ICD-10-CM | POA: Diagnosis not present

## 2022-01-24 DIAGNOSIS — A419 Sepsis, unspecified organism: Secondary | ICD-10-CM | POA: Diagnosis not present

## 2022-01-24 LAB — GLUCOSE, CAPILLARY
Glucose-Capillary: 233 mg/dL — ABNORMAL HIGH (ref 70–99)
Glucose-Capillary: 179 mg/dL — ABNORMAL HIGH (ref 70–99)
Glucose-Capillary: 130 mg/dL — ABNORMAL HIGH (ref 70–99)
Glucose-Capillary: 269 mg/dL — ABNORMAL HIGH (ref 70–99)

## 2022-01-24 MED ORDER — INSULIN ASPART 100 UNIT/ML IJ SOLN
3.0000 [IU] | Freq: Three times a day (TID) | INTRAMUSCULAR | Status: DC
  Administered 2022-01-24 – 2022-01-25 (×2): 3 [IU] via SUBCUTANEOUS

## 2022-01-24 MED ORDER — AMLODIPINE BESYLATE 10 MG PO TABS
10.0000 mg | ORAL_TABLET | Freq: Every day | ORAL | Status: DC
  Administered 2022-01-24: 10 mg via ORAL
  Filled 2022-01-24: qty 1

## 2022-01-24 MED ORDER — HYDRALAZINE HCL 50 MG PO TABS
50.0000 mg | ORAL_TABLET | Freq: Three times a day (TID) | ORAL | Status: DC
  Administered 2022-01-24 – 2022-01-25 (×3): 50 mg via ORAL
  Filled 2022-01-24 (×3): qty 1

## 2022-01-24 MED ORDER — SODIUM CHLORIDE 0.9 % IV SOLN
2.0000 g | INTRAVENOUS | Status: DC
  Administered 2022-01-24: 2 g via INTRAVENOUS
  Filled 2022-01-24: qty 20

## 2022-01-24 MED ORDER — LIP MEDEX EX OINT
TOPICAL_OINTMENT | CUTANEOUS | Status: DC | PRN
  Filled 2022-01-24 (×2): qty 7

## 2022-01-24 MED ORDER — LORATADINE 10 MG PO TABS
10.0000 mg | ORAL_TABLET | Freq: Every day | ORAL | Status: DC
  Administered 2022-01-24: 10 mg via ORAL
  Filled 2022-01-24: qty 1

## 2022-01-24 NOTE — Progress Notes (Addendum)
Mobility Specialist Progress Note  ? ? 01/24/22 1141  ?Mobility  ?Activity Ambulated with assistance in hallway  ?Level of Assistance Standby assist, set-up cues, supervision of patient - no hands on  ?Assistive Device Front wheel walker  ?Distance Ambulated (ft) 135 ft  ?Activity Response Tolerated fair  ?$Mobility charge 1 Mobility  ? ?Pre-Mobility: 83 HR, 149/103 BP, 96% SpO2 ?During Mobility: >/= 90% SpO2 ?Post-Mobility: 90 HR, 159/100 BP, 97% SpO2 ? ?Pt received in chair and agreeable. C/o feeling a 3/10 dizziness. Returned to chair with call bell in reach, LPN aware of BP. Session completed on RA.  ? ?Hildred Alamin ?Mobility Specialist  ?Primary: [address] Phone: [phone] ?Secondary: 6N M.S. Phone: [phone] ?  ?

## 2022-01-24 NOTE — Progress Notes (Signed)
?Progress Note ? ?Patient: Claudia [name] ERX:[id number] DOB: August 16, 1966  ?DOA: 01/19/2022  DOS: 01/24/2022  ?  ?Brief hospital course: ?Claudia [name] is a 57 y.o. female with a history of morbid obesity, HTN, combined CHF with recovered EF who presented to the ED 4/24 with fever and productive cough associated with headache found to have a dense inferior RUL pneumonia, admitted for sepsis and acute hypoxic respiratory failure due to CAP. ? ?Assessment and Plan: ?Sepsis due to RUL CAP: WBC responding, clinically stabilizing.  ?- Continue ceftriaxone 2g IV q24h, azithromycin to complete 5 days.  ?- Monitor blood cultures (NG4D). Leukocytosis resolved, hemodynamics stable.  ? ?Acute hypoxic respiratory failure due to CAP: CTA showed no PE. Negative flu, covid PCR.  ?- Though overall continuing to improve, she remains severely short of breath with ambulation and hypoxic with exertion and hypoxic with coughing spells at rest. Also having continued significant nocturnal hypoxia (unclear duration, but suspect this has been undetected chronically). Did not previously require supplemental oxygen. She remains hypoxic during my interview this morning and shortness of breath no where near her baseline. We will extend antibiotic duration to 7 days of ceftriaxone.  ?- Suspect an element of OSA, so suggest sleep study after resolution of acute illness.  ?- Repeat CXR in 4-6 weeks recommended as nodule, etc. is not ruled out.  ?- Maximize antitussives ? ?Lip swelling: Unclear precipitant. Not severe or impacting airway.  ?- Continue topical treatments ?- Continue medications as currently ordered but monitor closely ?- Start loratadine  ? ?Hemoptysis: Currently resolved. CBC stable. ?- IN saline spray prn ? ?Headache: Global, associated with cough. No aura, no meningismus.  ?- Continue antitussives and low dose hydromorphone prn. ? ?History of chronic HFrEF (2013, since recovered to normal systolic and diastolic function by echo  2019), resistant HTN:  ?- Continue home meds including hydralazine, spironolactone, norvasc, diltiazem, losartan, thiazide. Will augment norvasc '5mg'$  > '10mg'$  and hydralazine dose '25mg'$  > '50mg'$  based on continued hypertension. ? ?NIDT2DM uncontrolled with hyperglycemia: HbA1c 9.3%.  ?- Continue glargine 20u, resistant SSI. Improved. Start 3u mealtime novolog ?- Hold [facility], metformin. ? ?AKI: Very mild. BP now rising. CrCl is improved to normal. ?- Restarted home spironolactone, thiazide, losartan   ? ?History of NSTEMI: No current chest pain. Troponin normal at 13 x2.  ?- Continue aspirin.   ? ?Iron deficiency anemia: Mild ?- Continue home po iron. ? ?Morbid obesity: Estimated body mass index is 45.19 kg/m? as calculated from the following: ?  Height as of this encounter: '5\' 8"'$  (1.727 m). ?  Weight as of this encounter: 134.8 kg. ? ?Subjective: Still significantly short of breath with exertion, just getting t bathroom. She is able to ambulate with oxygen and is improved from prior days. No chest pain reported. Having some discomfort associated with oxygen and wanted it off last night but was forced to wear it due to desaturations. Her upper lip has started swelling. No wheezing or stridor, etc. and no tongue or other swelling per pt. Lips are dry.  ? ?Objective: ?Vitals:  ? 01/23/22 2349 01/24/22 0006 01/24/22 0503 01/24/22 9509  ?BP: (!) 171/103 (!) 164/89 (!) 158/94 (!) 151/93  ?Pulse: 94 84 87 90  ?Resp: '19 20 20 20  '$ ?Temp: 98.8 ?F (37.1 ?C)  98.6 ?F (37 ?C) 98.9 ?F (37.2 ?C)  ?TempSrc: Oral  Oral Oral  ?SpO2: 97% 96% 96% 92%  ?Weight:      ?Height:      ? ?Gen:  56 y.o. female in no distress ?HEENT: Upper lip edema with skin cracking/dry MM. Normal lower lip and oropharynx which is widely patent.  ?Pulm: Nonlabored but tachypneic at rest, SpO2 with good pleth down to 87-88% during coughing spells and with prolonged conversation. Cleared crackles, no stridor or wheezing. ?CV: Regular rate and rhythm. No murmur, rub, or  gallop. No JVD, no pitting dependent edema. ?GI: Abdomen soft, non-tender, non-distended, with normoactive bowel sounds.  ?Ext: Warm, no deformities ?Skin: No rashes, lesions or ulcers on visualized skin. ?Neuro: Alert and oriented. No focal neurological deficits. ?Psych: Judgement and insight appear fair. Mood euthymic & affect congruent. Behavior is appropriate.   ? ?Data Personally reviewed: ? ?CBC: ?Recent Labs  ?Lab 01/19/22 ?2326 01/20/22 ?0501 01/21/22 ?1761 01/22/22 ?0440 01/23/22 ?0225  ?WBC 21.5* 25.1* 17.8* 11.4* 9.3  ?NEUTROABS 18.7*  --   --   --   --   ?HGB 12.2 11.8* 11.6* 10.3* 10.6*  ?HCT 40.9 38.7 39.3 36.1 36.7  ?MCV 73.4* 73.2* 75.0* 75.1* 74.0*  ?PLT 384 385 321 313 361  ? ?Basic Metabolic Panel: ?Recent Labs  ?Lab 01/19/22 ?2326 01/20/22 ?0501 01/21/22 ?6073  ?NA 135  --  139  ?K 3.9  --  4.1  ?CL 98  --  101  ?CO2 26  --  33*  ?GLUCOSE 391*  --  176*  ?BUN 20  --  8  ?CREATININE 1.05* 0.85 0.61  ?CALCIUM 8.9  --  8.9  ? ?GFR: ?Estimated Creatinine Clearance: 117.1 mL/min (by C-G formula based on SCr of 0.61 mg/dL). ?Liver Function Tests: ?Recent Labs  ?Lab 01/19/22 ?2326 01/21/22 ?0213  ?AST 20 37  ?ALT 22 42  ?ALKPHOS 53 63  ?BILITOT 0.7 0.5  ?PROT 7.8 7.6  ?ALBUMIN 3.8 3.3*  ? ?No results for input(s): LIPASE, AMYLASE in the last 168 hours. ?No results for input(s): AMMONIA in the last 168 hours. ?Coagulation Profile: ?Recent Labs  ?Lab 01/19/22 ?2326  ?INR 1.0  ? ?Cardiac Enzymes: ?No results for input(s): CKTOTAL, CKMB, CKMBINDEX, TROPONINI in the last 168 hours. ?BNP (last 3 results) ?No results for input(s): PROBNP in the last 8760 hours. ?HbA1C: ?No results for input(s): HGBA1C in the last 72 hours. ? ?CBG: ?Recent Labs  ?Lab 01/23/22 ?1107 01/23/22 ?1532 01/23/22 ?1959 01/23/22 ?2217 01/24/22 ?7106  ?[place] ?Lipid Profile: ?No results for input(s): CHOL, HDL, LDLCALC, TRIG, CHOLHDL, LDLDIRECT in the last 72 hours. ?Thyroid Function Tests: ?No results for  input(s): TSH, T4TOTAL, FREET4, T3FREE, THYROIDAB in the last 72 hours. ?Anemia Panel: ?No results for input(s): VITAMINB12, FOLATE, FERRITIN, TIBC, IRON, RETICCTPCT in the last 72 hours. ?Urine analysis: ?   ?Component Value Date/Time  ? [place] YELLOW 01/19/2022 2326  ? APPEARANCEUR HAZY (A) 01/19/2022 2326  ? LABSPEC >1.030 (H) 01/19/2022 2326  ? PHURINE 5.5 01/19/2022 2326  ? GLUCOSEU >=500 (A) 01/19/2022 2326  ? [place] NEGATIVE 01/19/2022 2326  ? [place] NEGATIVE 01/19/2022 2326  ? BILIRUBINUR modrate 10/04/2020 1603  ? [place] NEGATIVE 01/19/2022 2326  ? [place] NEGATIVE 01/19/2022 2326  ? UROBILINOGEN 0.2 10/04/2020 1603  ? UROBILINOGEN 0.2 10/13/2017 1530  ? NITRITE NEGATIVE 01/19/2022 2326  ? LEUKOCYTESUR TRACE (A) 01/19/2022 2326  ? ?Recent Results (from the past 240 hour(s))  ?Resp Panel by RT-PCR (Flu A&B, Covid) Nasopharyngeal Swab     Status: None  ? Collection Time: 01/19/22 11:37 PM  ? Specimen: Nasopharyngeal Swab; Nasopharyngeal(NP) swabs in vial transport medium  ?Result Value  Ref Range Status  ? SARS Coronavirus 2 by RT PCR NEGATIVE NEGATIVE Final  ?  Comment: (NOTE) ?SARS-CoV-2 target nucleic acids are NOT DETECTED. ? ?The SARS-CoV-2 RNA is generally detectable in upper respiratory ?specimens during the acute phase of infection. The lowest ?concentration of SARS-CoV-2 viral copies this assay can detect is ?138 copies/mL. A negative result does not preclude SARS-Cov-2 ?infection and should not be used as the sole basis for treatment or ?other patient management decisions. A negative result may occur with  ?improper specimen collection/handling, submission of specimen other ?than nasopharyngeal swab, presence of viral mutation(s) within the ?areas targeted by this assay, and inadequate number of viral ?copies(<138 copies/mL). A negative result must be combined with ?clinical observations, patient history, and epidemiological ?information. The expected result is Negative. ? ?Fact Sheet  for Patients:  ?EntrepreneurPulse.com.au ? ?Fact Sheet for Healthcare Providers:  ?IncredibleEmployment.be ? ?This test is no t yet approved or cleared by the [place] [facility]

## 2022-01-25 DIAGNOSIS — E1159 Type 2 diabetes mellitus with other circulatory complications: Secondary | ICD-10-CM | POA: Diagnosis not present

## 2022-01-25 DIAGNOSIS — I252 Old myocardial infarction: Secondary | ICD-10-CM | POA: Diagnosis not present

## 2022-01-25 DIAGNOSIS — A419 Sepsis, unspecified organism: Secondary | ICD-10-CM | POA: Diagnosis not present

## 2022-01-25 DIAGNOSIS — I10 Essential (primary) hypertension: Secondary | ICD-10-CM | POA: Diagnosis not present

## 2022-01-25 LAB — CULTURE, BLOOD (ROUTINE X 2)
Culture: NO GROWTH
Culture: NO GROWTH
Special Requests: ADEQUATE

## 2022-01-25 LAB — GLUCOSE, CAPILLARY: Glucose-Capillary: 231 mg/dL — ABNORMAL HIGH (ref 70–99)

## 2022-01-25 MED ORDER — BENZONATATE 100 MG PO CAPS: 100.0000 mg | ORAL_CAPSULE | Freq: Three times a day (TID) | ORAL | 0 refills | Status: DC

## 2022-01-25 MED ORDER — CEFDINIR 300 MG PO CAPS: 300.0000 mg | ORAL_CAPSULE | Freq: Two times a day (BID) | ORAL | 0 refills | Status: DC

## 2022-01-25 NOTE — Discharge Summary (Signed)
?Physician Discharge Summary ?  ?Patient: Claudia [name] MRN: [id number] DOB: 04-Sep-1966  ?Admit date:     01/19/2022  ?Discharge date: 01/25/22  ?Discharge Physician: Patrecia Pour  ? ?PCP: Fenton Foy, NP  ? ?Recommendations at discharge:  ?Follow up with PCP in next week.  ?Suggest sleep study after resolution of acute illness due to high suspicion for sleep apnea.  ?Will need repeat CXR in 4-6 weeks. ? ?Discharge Diagnoses: ?Principal Problem: ?  Sepsis ([place]) ?Active Problems: ?  HTN (hypertension) ?  DM (diabetes mellitus) ([place]) ?  Hx of non-ST elevation myocardial infarction (NSTEMI) ? ?Hospital Course: ?Claudia [name] is a 56 y.o. female with a history of morbid obesity, HTN, combined CHF with recovered EF who presented to the ED 4/24 with fever and productive cough associated with headache found to have a dense inferior RUL pneumonia, admitted for sepsis and acute hypoxic respiratory failure due to CAP. With treatment this resolved. See below for full details. ? ?Assessment and Plan: ?Sepsis due to RUL CAP: Leukocytosis resolved, hemodynamics stable. Blood cultures negative at 5 days.  ?- Complete the course of antibiotics with omnicef, also received azithromycin x5 days.   ?  ?Acute hypoxic respiratory failure due to CAP: CTA showed no PE. Negative flu, covid PCR. Resolved at time of discharge.  ?- Noted nocturnal hypoxia (unclear duration, but suspect this has been undetected chronically). Suspect an element of OSA, so suggest sleep study after resolution of acute illness.  ?- Repeat CXR in 4-6 weeks recommended as nodule, etc. is not ruled out.  ?- Maximize antitussives ?  ?Lip swelling: Unclear precipitant. Not severe or impacting airway. Improved. ?  ?Hemoptysis: Resolved. ?  ?Headache: Global, associated with cough. No aura, no meningismus. Improved.   ?  ?History of chronic HFrEF (2013, since recovered to normal systolic and diastolic function by echo 2019), resistant HTN:  ?- Continue home meds.  Recheck BP at follow up with PCP.  ?  ?NIDT2DM uncontrolled with hyperglycemia: HbA1c 9.3%.  ?- Continue home medications. Based on glycemic trends here and elevated HbA1c, will benefit from augmentation/addition to regimen per PCP.   ?  ?AKI: Very mild. CrCl is improved to normal. ?  ?History of NSTEMI: No current chest pain. Troponin normal at 13 x2.  ?- Continue aspirin. Statin allergy listed. ?  ?Iron deficiency anemia: Mild ?- Continue home po iron. ?  ?Morbid obesity: Estimated body mass index is 45.19 kg/m? as calculated from the following: ?  Height as of this encounter: 5' 8"  (1.727 m). ?  Weight as of this encounter: 134.8 kg. ? ?Consultants: None ?Procedures performed: None  ?Disposition: Home ?Diet recommendation:  ?Cardiac and Carb modified diet ?DISCHARGE MEDICATION: ?Allergies as of 01/25/2022   ? ?   Reactions  ? Beef-derived Products   ? Patient does not eat beef  ? Crestor [rosuvastatin]   ? MUSCLE ACHES HEADACHES  ? Hydrocodone Itching  ? Other Hives, Swelling, Other (See Comments)  ? Vision changes; ALLERGIC TO ALL NUTS EXCEPT PEANUTS  ? Oxycodone Itching  ? Pork-derived Products   ? For dietary, patient does not eat pork  ? Pravachol [pravastatin Sodium] Itching  ? Joint pain  ? Simvastatin Hives  ? ?  ? ?  ?Medication List  ?  ? ?STOP taking these medications   ? ?Vitamin D (Ergocalciferol) 1.25 MG (50000 UNIT) Caps capsule ?Commonly known as: DRISDOL ?  ? ?  ? ?TAKE these medications   ? ?acetaminophen 500  MG tablet ?Commonly known as: TYLENOL ?Take 500 mg by mouth every 6 (six) hours as needed for mild pain or moderate pain. ?  ?amLODipine 5 MG tablet ?Commonly known as: NORVASC ?Take 1 tablet (5 mg total) by mouth daily. ?  ?aspirin 81 MG EC tablet ?Take 1 tablet (81 mg total) by mouth daily. ?  ?benzonatate 100 MG capsule ?Commonly known as: TESSALON ?Take 1 capsule (100 mg total) by mouth 3 (three) times daily. ?  ?blood glucose meter kit and supplies Kit ?Dispense based on patient and  insurance preference. Use up to four times daily as directed. (FOR ICD-9 250.00, 250.01). ?  ?cefdinir 300 MG capsule ?Commonly known as: OMNICEF ?Take 1 capsule (300 mg total) by mouth 2 (two) times daily. ?  ?diltiazem 120 MG 24 hr capsule ?Commonly known as: CARDIZEM CD ?Take 1 capsule (120 mg total) by mouth daily. ?  ?ferrous sulfate 325 (65 FE) MG tablet ?Take 1 tablet (325 mg total) by mouth 2 (two) times daily. ?  ?Fish Oil 1000 MG Caps ?Take by mouth. ?  ?glipiZIDE 10 MG tablet ?Commonly known as: GLUCOTROL ?Take 1 tablet (10 mg total) by mouth 2 (two) times daily before a meal. ?  ?hydrALAZINE 25 MG tablet ?Commonly known as: APRESOLINE ?Take 1 tablet (25 mg total) by mouth every 8 (eight) hours. ?  ?hydrochlorothiazide 25 MG tablet ?Commonly known as: HYDRODIURIL ?Take 1 tablet (25 mg total) by mouth daily. ?  ?losartan 100 MG tablet ?Commonly known as: COZAAR ?Take 0.5 tablets (50 mg total) by mouth daily. ?  ?metFORMIN 1000 MG tablet ?Commonly known as: GLUCOPHAGE ?Take 1 tablet (1,000 mg total) by mouth 2 (two) times daily with a meal. ?  ?OneTouch Verio test strip ?Generic drug: glucose blood ?Use 1 strip to check blood glucose 4 (four) times daily. ?  ?spironolactone 25 MG tablet ?Commonly known as: ALDACTONE ?Take 1 tablet (25 mg total) by mouth daily. ?  ? ?  ? ? Follow-up Information   ? ? Fenton Foy, NP. Schedule an appointment as soon as possible for a visit in 1 week(s).   ?Specialty: Pulmonary Disease ?Contact information: ?[address], [address] ?[place] [address] ?[phone] ? ? ?  ?  ? ? Jerline Pain, MD .   ?Specialty: Cardiology ?Contact information: ?[address] ?[address] ?[place] [address] ?[phone] ? ? ?  ?  ? ?  ?  ? ?  ? ?Discharge Exam: ?BP (!) 141/83 (BP Location: Right Arm)   Pulse 94   Temp 98.2 ?F (36.8 ?C) (Oral)   Resp 20   Ht 5' 8"  (1.727 m)   Wt 134.8 kg   LMP 05/09/2012   SpO2 94%   BMI 45.19 kg/m?   ?Well-appearing in no  distress ?Clear, nonlabored on room air ?RRR without MRG or pitting edema ? ?Condition at discharge: stable ? ?The results of significant diagnostics from this hospitalization (including imaging, microbiology, ancillary and laboratory) are listed below for reference.  ? ?Imaging Studies: ?DG Chest 2 View ? ?Result Date: 01/20/2022 ?CLINICAL DATA:  Encounter for pneumonia EXAM: CHEST - 2 VIEW COMPARISON:  Yesterday FINDINGS: Dense right upper lobe opacity appears similarly extensive with no visible cavitation or pleural fluid. Cardiomegaly. No pneumothorax. Extensive artifact from EKG leads. IMPRESSION: Unchanged right upper lobe pneumonia Electronically Signed   By: Jorje Guild M.D.   On: 01/20/2022 05:23  ? ?CT Angio Chest Pulmonary Embolism (PE) W or WO Contrast ? ?  Result Date: 01/20/2022 ?CLINICAL DATA:  Acute hypoxic respiratory failure. Fever and tachycardia. EXAM: CT ANGIOGRAPHY CHEST WITH CONTRAST TECHNIQUE: Multidetector CT imaging of the chest was performed using the standard protocol during bolus administration of intravenous contrast. Multiplanar CT image reconstructions and MIPs were obtained to evaluate the vascular anatomy. Please note that due to insufficient pulmonary arterial opacification on the initial scan attempt at 9:16 a.m., a repeat contrast dose and repeat scan was performed shortly after the original CT was acquired. I have reviewed both sets of images. RADIATION DOSE REDUCTION: This exam was performed according to the departmental dose-optimization program which includes automated exposure control, adjustment of the mA and/or kV according to patient size and/or use of iterative reconstruction technique. CONTRAST:  4m OMNIPAQUE IOHEXOL 350 MG/ML SOLN COMPARISON:  Multiple exams, including 01/21/2012 FINDINGS: Body habitus reduces diagnostic sensitivity and specificity. Despite efforts by the technologist and patient, motion artifact is present on today's exam and could not be  eliminated. This reduces exam sensitivity and specificity. Cardiovascular: Moderate cardiomegaly. No filling defect is identified in the pulmonary arterial tree to suggest pulmonary embolus. Mediastinum/Nodes: Right lower paratracheal

## 2022-01-25 NOTE — TOC Transition Note (Signed)
Transition of Care (TOC) - CM/SW Discharge Note ? ? ?Patient Details  ?Name: Claudia [name] ?MRN: [id number] ?Date of Birth: Feb 28, 1966 ? ?Transition of Care (TOC) CM/SW Contact:  ?Bartholomew Crews, RN ?Phone Number: [phone] ?01/25/2022, 9:10 AM ? ? ?Clinical Narrative:    ? ?Patient to transition home today. No TOC needs identified at this time.  ? ?Final next level of care: Home/Self Care ?Barriers to Discharge: No Barriers Identified ? ? ?Patient Goals and CMS Choice ?  ?  ?  ? ?Discharge Placement ?  ?           ?  ?  ?  ?  ? ?Discharge Plan and Services ?  ?  ?           ?  ?  ?  ?  ?  ?  ?  ?  ?  ?  ? ?Social Determinants of Health (SDOH) Interventions ?  ? ? ?Readmission Risk Interventions ?   ? View : No data to display.  ?  ?  ?  ? ? ? ? ? ?

## 2022-02-03 ENCOUNTER — Ambulatory Visit (INDEPENDENT_AMBULATORY_CARE_PROVIDER_SITE_OTHER): Payer: 59 | Admitting: Nurse Practitioner

## 2022-02-03 ENCOUNTER — Other Ambulatory Visit: Payer: Self-pay | Admitting: Nurse Practitioner

## 2022-02-03 ENCOUNTER — Encounter: Payer: Self-pay | Admitting: Nurse Practitioner

## 2022-02-03 VITALS — BP 141/88 | HR 86 | Temp 97.7°F | Ht 68.0 in | Wt 291.4 lb

## 2022-02-03 DIAGNOSIS — G4739 Other sleep apnea: Secondary | ICD-10-CM

## 2022-02-03 DIAGNOSIS — E1159 Type 2 diabetes mellitus with other circulatory complications: Secondary | ICD-10-CM

## 2022-02-03 DIAGNOSIS — I10 Essential (primary) hypertension: Secondary | ICD-10-CM | POA: Diagnosis not present

## 2022-02-03 LAB — POCT GLYCOSYLATED HEMOGLOBIN (HGB A1C)
HbA1c POC (<> result, manual entry): 9.2 % (ref 4.0–5.6)
HbA1c, POC (controlled diabetic range): 9.2 % — AB (ref 0.0–7.0)
HbA1c, POC (prediabetic range): 9.2 % — AB (ref 5.7–6.4)
Hemoglobin A1C: 9.2 % — AB (ref 4.0–5.6)

## 2022-02-03 MED ORDER — AMLODIPINE BESYLATE 5 MG PO TABS: 10.0000 mg | ORAL_TABLET | Freq: Every day | ORAL | 2 refills | Status: DC

## 2022-02-03 NOTE — Patient Instructions (Signed)
You were seen today in the [facility] for reevaluation of DM and hospital follow up. Labs were collected, results will be available via MyChart or, if abnormal, you will be contacted by clinic staff. You were prescribed medications, please take as directed. Please follow up in 3 mths for reevaluation of HTN and DM. ?

## 2022-02-03 NOTE — Progress Notes (Signed)
? ?[facility] ?[address], [place]  [address] ?Phone:  [phone]   Fax:  [phone] ?Subjective:  ? Patient ID: Claudia [name], female    DOB: 06-29-1966, 56 y.o.   MRN: [id number] ? ?Chief Complaint  ?Patient presents with  ? Hospitalization Follow-up  ?  Pt here for hospital follow up visit. Pt stated she had pneumonia on 01/19/22 pt   ? ?HPI ?Claudia [name] 56 y.o. female  has a past medical history of Diabetes mellitus, Fibroids, Headache(784.0), History of echocardiogram, Hypercholesterolemia, Hyperlipidemia, Hypertension, Osteoarthritis, Right knee pain, Seizures ([place]), STEMI (ST elevation myocardial infarction) ([place]), and Vitamin D deficiency. To the [facility] for hospital follow up and reevaluation of DM. ? ?Patient states that she went to the ED on 01/19/22 with flu like symptoms, elevated temperature and cough. Was admitted to the hospital for 6 days. Symptoms have subsided since being discharged. 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  ? ?Diabetes Mellitus: Patient presents for follow up of diabetes. Symptoms: none. . Patient denies none.  Evaluation to date has been included: hemoglobin A1C.  Home sugars: BGs range between 96 and 200 . Treatment to date: no recent interventions. Patient is currently compliant with all medications. Denies adhering to diet and/ exercise regimen.  ? ?Denies any other concerns today.Denies any fatigue, chest pain, shortness of breath, HA or dizziness. Denies any blurred vision, numbness or tingling. ? ? ?Past Medical History:  ?Diagnosis Date  ? Diabetes mellitus   ? Fibroids   ? Headache(784.0)   ? History of echocardiogram   ? Echo 7/18: Moderate concentric LVH, EF 55-60, normal wall motion, grade 1 diastolic dysfunction, calcified aortic leaflets  ? Hypercholesterolemia   ? Hyperlipidemia   ? Hypertension   ? Osteoarthritis   ? , with mild meniscus tear, followed by orthopedics--Dr  Gladstone Lighter 04/29/10  ? Right knee pain   ? Seizures ([place])   ? as child  ? STEMI (ST elevation myocardial infarction) ([place])   ? Vitamin D deficiency   ? ? ?Past Surgical History:  ?Procedure Laterality Date  ? ANTERIOR CERVICAL DECOMP/DISCECTOMY FUSION N/A 09/07/2013  ? Procedure: ANTERIOR CERVICAL DECOMPRESSION/DISCECTOMY FUSION 1 LEVEL;  Surgeon: Sinclair Ship, MD;  Location: [facility];  Service: Orthopedics;  Laterality: N/A;  Anterior cervical decompression fusion, cervical 5-6 with instrumentation and allograft  ? LAPAROSCOPIC ASSISTED VAGINAL HYSTERECTOMY  1/14  ? LEFT HEART CATH AND CORONARY ANGIOGRAPHY N/A 04/25/2018  ? Procedure: LEFT HEART CATH AND CORONARY ANGIOGRAPHY;  Surgeon: Lorretta Harp, MD;  Location: [facility];  Service: Cardiovascular;  Laterality: N/A;  ? SHOULDER SURGERY Right 11  ? rotator cuff  ? TUBAL LIGATION    ? UMBILICAL HERNIA REPAIR    ? ? ?Family History  ?Problem Relation Age of Onset  ? Hypertension Mother   ? Diabetes Mother   ? Hypercholesterolemia Mother   ? Hypertension Sister   ? CVA Maternal Grandmother   ? Hypertension Maternal Grandmother   ? Diabetes Maternal Grandmother   ? Heart attack Neg Hx   ? ? ?Social History  ? ?Socioeconomic History  ? Marital status: Married  ?  Spouse name: Not on file  ? Number of children: Not on file  ? Years of education: Not on file  ? Highest education level: Not on file  ?Occupational History  ? Not on file  ?Tobacco Use  ? Smoking status: Never  ?  Smokeless tobacco: Never  ?Vaping Use  ? Vaping Use: Never used  ?Substance and Sexual Activity  ? Alcohol use: No  ? Drug use: No  ? Sexual activity: Yes  ?Other Topics Concern  ? Not on file  ?Social History Narrative  ? Not on file  ? ?Social Determinants of Health  ? ?Financial Resource Strain: Not on file  ?Food Insecurity: Not on file  ?Transportation Needs: Not on file  ?Physical Activity: Not on file  ?Stress: Not on file  ?Social Connections: Not on file  ?Intimate Partner  Violence: Not on file  ? ? ?Outpatient Medications Prior to Visit  ?Medication Sig Dispense Refill  ? acetaminophen (TYLENOL) 500 MG tablet Take 500 mg by mouth every 6 (six) hours as needed for mild pain or moderate pain.    ? aspirin 81 MG EC tablet Take 1 tablet (81 mg total) by mouth daily. 30 tablet 6  ? benzonatate (TESSALON) 100 MG capsule Take 1 capsule (100 mg total) by mouth 3 (three) times daily. 20 capsule 0  ? diltiazem (CARDIZEM CD) 120 MG 24 hr capsule Take 1 capsule (120 mg total) by mouth daily. 90 capsule 3  ? ferrous sulfate 325 (65 FE) MG tablet Take 1 tablet (325 mg total) by mouth 2 (two) times daily. 60 tablet 6  ? glipiZIDE (GLUCOTROL) 10 MG tablet Take 1 tablet (10 mg total) by mouth 2 (two) times daily before a meal. 180 tablet 3  ? glucose blood (ONETOUCH VERIO) test strip Use 1 strip to check blood glucose 4 (four) times daily. 360 each 3  ? hydrALAZINE (APRESOLINE) 25 MG tablet Take 1 tablet (25 mg total) by mouth every 8 (eight) hours. 270 each 3  ? hydrochlorothiazide (HYDRODIURIL) 25 MG tablet Take 1 tablet (25 mg total) by mouth daily. 90 tablet 3  ? losartan (COZAAR) 100 MG tablet Take 0.5 tablets (50 mg total) by mouth daily. 45 tablet 3  ? metFORMIN (GLUCOPHAGE) 1000 MG tablet Take 1 tablet (1,000 mg total) by mouth 2 (two) times daily with a meal. 180 tablet 3  ? Omega-3 Fatty Acids (FISH OIL) 1000 MG CAPS Take by mouth.     ? amLODipine (NORVASC) 5 MG tablet Take 1 tablet (5 mg total) by mouth daily. 90 tablet 3  ? blood glucose meter kit and supplies KIT Dispense based on patient and insurance preference. Use up to four times daily as directed. (FOR ICD-9 250.00, 250.01). 1 each 0  ? cefdinir (OMNICEF) 300 MG capsule Take 1 capsule (300 mg total) by mouth 2 (two) times daily. 7 capsule 0  ? spironolactone (ALDACTONE) 25 MG tablet Take 1 tablet (25 mg total) by mouth daily. 90 tablet 3  ? ?No facility-administered medications prior to visit.  ? ? ?Allergies  ?Allergen Reactions   ? Beef-Derived Products   ?  Patient does not eat beef  ? Crestor [Rosuvastatin]   ?  MUSCLE ACHES HEADACHES ?  ? Hydrocodone Itching  ? Other Hives, Swelling and Other (See Comments)  ?  Vision changes; ALLERGIC TO ALL NUTS EXCEPT PEANUTS  ? Oxycodone Itching  ? Pork-Derived Products   ?  For dietary, patient does not eat pork  ? Pravachol [Pravastatin Sodium] Itching  ?  Joint pain  ? Simvastatin Hives  ? ? ?Review of Systems  ?Constitutional: Negative.  Negative for chills, fever and malaise/fatigue.  ?HENT: Negative.    ?Respiratory: Negative.  Negative for cough and shortness of breath.   ?Cardiovascular:  Negative.  Negative for chest pain, palpitations and leg swelling.  ?Gastrointestinal:  Negative for abdominal pain, blood in stool, constipation, diarrhea, nausea and vomiting.  ?Skin: Negative.   ?Neurological: Negative.   ?Psychiatric/Behavioral: Negative.  Negative for depression. The patient is not nervous/anxious.   ?All other systems reviewed and are negative. ? ?   ?Objective:  ?  ?Physical Exam ?Vitals reviewed.  ?Constitutional:   ?   General: She is not in acute distress. ?   Appearance: Normal appearance. She is obese.  ?HENT:  ?   Head: Normocephalic.  ?Eyes:  ?   General: No scleral icterus.    ?   Right eye: No discharge.     ?   Left eye: No discharge.  ?   Conjunctiva/sclera: Conjunctivae normal.  ?   Pupils: Pupils are equal, round, and reactive to light.  ?Cardiovascular:  ?   Rate and Rhythm: Normal rate and regular rhythm.  ?   Pulses: Normal pulses.  ?   Heart sounds: Normal heart sounds.  ?   Comments: No obvious peripheral edema ?Pulmonary:  ?   Effort: Pulmonary effort is normal.  ?   Breath sounds: Normal breath sounds.  ?Musculoskeletal:     ?   General: No swelling, tenderness, deformity or signs of injury. Normal range of motion.  ?   Right lower leg: No edema.  ?   Left lower leg: No edema.  ?Skin: ?   General: Skin is warm and dry.  ?   Capillary Refill: Capillary refill takes  less than 2 seconds.  ?Neurological:  ?   General: No focal deficit present.  ?   Mental Status: She is alert and oriented to person, place, and time.  ?   Cranial Nerves: No cranial nerve deficit.  ?   Sensory

## 2022-02-04 LAB — COMPREHENSIVE METABOLIC PANEL
ALT: 21 IU/L (ref 0–32)
AST: 14 IU/L (ref 0–40)
Albumin/Globulin Ratio: 1.3 (ref 1.2–2.2)
Albumin: 4.5 g/dL (ref 3.8–4.9)
Alkaline Phosphatase: 54 IU/L (ref 44–121)
BUN/Creatinine Ratio: 19 (ref 9–23)
BUN: 15 mg/dL (ref 6–24)
Bilirubin Total: 0.5 mg/dL (ref 0.0–1.2)
CO2: 30 mmol/L — ABNORMAL HIGH (ref 20–29)
Calcium: 9.8 mg/dL (ref 8.7–10.2)
Chloride: 98 mmol/L (ref 96–106)
Creatinine, Ser: 0.8 mg/dL (ref 0.57–1.00)
Globulin, Total: 3.4 g/dL (ref 1.5–4.5)
Glucose: 97 mg/dL (ref 70–99)
Potassium: 4.5 mmol/L (ref 3.5–5.2)
Sodium: 141 mmol/L (ref 134–144)
Total Protein: 7.9 g/dL (ref 6.0–8.5)
eGFR: 88 mL/min/{1.73_m2} (ref >59)

## 2022-02-04 LAB — CBC WITH DIFFERENTIAL/PLATELET
Basophils Absolute: 0 10*3/uL (ref 0.0–0.2)
Basos: 0 %
EOS (ABSOLUTE): 0.2 10*3/uL (ref 0.0–0.4)
Eos: 3 %
Hematocrit: 40 % (ref 34.0–46.6)
Hemoglobin: 12.2 g/dL (ref 11.1–15.9)
Immature Grans (Abs): 0 10*3/uL (ref 0.0–0.1)
Immature Granulocytes: 0 %
Lymphocytes Absolute: 2.8 10*3/uL (ref 0.7–3.1)
Lymphs: 40 %
MCH: 22.4 pg — ABNORMAL LOW (ref 26.6–33.0)
MCHC: 30.5 g/dL — ABNORMAL LOW (ref 31.5–35.7)
MCV: 73 fL — ABNORMAL LOW (ref 79–97)
Monocytes Absolute: 0.7 10*3/uL (ref 0.1–0.9)
Monocytes: 10 %
Neutrophils Absolute: 3.2 10*3/uL (ref 1.4–7.0)
Neutrophils: 47 %
Platelets: 534 10*3/uL — ABNORMAL HIGH (ref 150–450)
RBC: 5.45 x10E6/uL — ABNORMAL HIGH (ref 3.77–5.28)
RDW: 15.7 % — ABNORMAL HIGH (ref 11.7–15.4)
WBC: 6.9 10*3/uL (ref 3.4–10.8)

## 2022-03-09 ENCOUNTER — Institutional Professional Consult (permissible substitution): Payer: 59 | Admitting: Primary Care

## 2022-03-12 ENCOUNTER — Institutional Professional Consult (permissible substitution): Payer: 59 | Admitting: Primary Care

## 2022-03-13 ENCOUNTER — Ambulatory Visit (HOSPITAL_COMMUNITY)
Admission: EM | Admit: 2022-03-13 | Discharge: 2022-03-13 | Disposition: A | Discharge status: Other Acute Inpatient Hospital | Payer: 59 | Source: Home / Self Care

## 2022-03-13 ENCOUNTER — Encounter (HOSPITAL_COMMUNITY): Payer: Self-pay | Admitting: Emergency Medicine

## 2022-03-13 ENCOUNTER — Encounter (HOSPITAL_COMMUNITY): Payer: Self-pay

## 2022-03-13 ENCOUNTER — Emergency Department (HOSPITAL_COMMUNITY)
Admission: EM | Admit: 2022-03-13 | Discharge: 2022-03-13 | Disposition: A | Discharge status: Home / Self Care | Payer: 59 | Attending: Emergency Medicine | Admitting: Emergency Medicine

## 2022-03-13 ENCOUNTER — Other Ambulatory Visit: Payer: Self-pay

## 2022-03-13 ENCOUNTER — Emergency Department (HOSPITAL_COMMUNITY): Payer: 59

## 2022-03-13 DIAGNOSIS — J039 Acute tonsillitis, unspecified: Secondary | ICD-10-CM

## 2022-03-13 DIAGNOSIS — J020 Streptococcal pharyngitis: Secondary | ICD-10-CM | POA: Insufficient documentation

## 2022-03-13 DIAGNOSIS — Z7982 Long term (current) use of aspirin: Secondary | ICD-10-CM | POA: Insufficient documentation

## 2022-03-13 DIAGNOSIS — J390 Retropharyngeal and parapharyngeal abscess: Secondary | ICD-10-CM

## 2022-03-13 DIAGNOSIS — J029 Acute pharyngitis, unspecified: Secondary | ICD-10-CM | POA: Diagnosis present

## 2022-03-13 LAB — CBC WITH DIFFERENTIAL/PLATELET
Abs Immature Granulocytes: 0.03 10*3/uL (ref 0.00–0.07)
Basophils Absolute: 0 10*3/uL (ref 0.0–0.1)
Basophils Relative: 0 %
Eosinophils Absolute: 0.3 10*3/uL (ref 0.0–0.5)
Eosinophils Relative: 3 %
HCT: 41.5 % (ref 36.0–46.0)
Hemoglobin: 12.3 g/dL (ref 12.0–15.0)
Immature Granulocytes: 0 %
Lymphocytes Relative: 35 %
Lymphs Abs: 3.7 10*3/uL (ref 0.7–4.0)
MCH: 21.7 pg — ABNORMAL LOW (ref 26.0–34.0)
MCHC: 29.6 g/dL — ABNORMAL LOW (ref 30.0–36.0)
MCV: 73.1 fL — ABNORMAL LOW (ref 80.0–100.0)
Monocytes Absolute: 1.2 10*3/uL — ABNORMAL HIGH (ref 0.1–1.0)
Monocytes Relative: 12 %
Neutro Abs: 5.1 10*3/uL (ref 1.7–7.7)
Neutrophils Relative %: 50 %
Platelets: 404 10*3/uL — ABNORMAL HIGH (ref 150–400)
RBC: 5.68 MIL/uL — ABNORMAL HIGH (ref 3.87–5.11)
RDW: 16.3 % — ABNORMAL HIGH (ref 11.5–15.5)
WBC: 10.4 10*3/uL (ref 4.0–10.5)
nRBC: 0 % (ref 0.0–0.2)

## 2022-03-13 LAB — BASIC METABOLIC PANEL
Anion gap: 10 (ref 5–15)
BUN: 13 mg/dL (ref 6–20)
CO2: 31 mmol/L (ref 22–32)
Calcium: 9.4 mg/dL (ref 8.9–10.3)
Chloride: 97 mmol/L — ABNORMAL LOW (ref 98–111)
Creatinine, Ser: 0.79 mg/dL (ref 0.44–1.00)
GFR, Estimated: >60 mL/min (ref >60)
Glucose, Bld: 184 mg/dL — ABNORMAL HIGH (ref 70–99)
Potassium: 4.2 mmol/L (ref 3.5–5.1)
Sodium: 138 mmol/L (ref 135–145)

## 2022-03-13 LAB — POCT RAPID STREP A, ED / UC: Streptococcus, Group A Screen (Direct): NEGATIVE

## 2022-03-13 LAB — GROUP A STREP BY PCR: Group A Strep by PCR: DETECTED — AB

## 2022-03-13 MED ORDER — KETOROLAC TROMETHAMINE 15 MG/ML IJ SOLN
15.0000 mg | Freq: Once | INTRAMUSCULAR | Status: AC
  Administered 2022-03-13: 15 mg via INTRAVENOUS
  Filled 2022-03-13: qty 1

## 2022-03-13 MED ORDER — CLINDAMYCIN HCL 150 MG PO CAPS
600.0000 mg | ORAL_CAPSULE | Freq: Once | ORAL | Status: AC
Start: 2022-03-13 — End: 2022-03-13
  Administered 2022-03-13: 600 mg via ORAL
  Filled 2022-03-13: qty 4

## 2022-03-13 MED ORDER — LACTATED RINGERS IV BOLUS
1000.0000 mL | Freq: Once | INTRAVENOUS | Status: AC
  Administered 2022-03-13: 1000 mL via INTRAVENOUS

## 2022-03-13 MED ORDER — CLINDAMYCIN HCL 150 MG PO CAPS: 150.0000 mg | ORAL_CAPSULE | Freq: Four times a day (QID) | ORAL | 0 refills | Status: AC

## 2022-03-13 MED ORDER — DEXAMETHASONE SODIUM PHOSPHATE 10 MG/ML IJ SOLN
8.0000 mg | Freq: Once | INTRAMUSCULAR | Status: AC
  Administered 2022-03-13: 8 mg via INTRAVENOUS
  Filled 2022-03-13: qty 1

## 2022-03-13 MED ORDER — IOHEXOL 300 MG/ML  SOLN
75.0000 mL | Freq: Once | INTRAMUSCULAR | Status: AC | PRN
  Administered 2022-03-13: 75 mL via INTRAVENOUS

## 2022-03-13 NOTE — ED Triage Notes (Signed)
Throat pain and swelling since last Saturday.  Reports was tested for strep which was negative.

## 2022-03-13 NOTE — ED Triage Notes (Signed)
Pt c/o sore throat since last Saturday. Reports using throat sprays without relief.

## 2022-03-13 NOTE — ED Notes (Signed)
Patient is being discharged from the Urgent Care and sent to the Emergency Department via Personal Vehicle . Per Marin Roberts, patient is in need of higher level of care due to Throat Swelling . Patient is aware and verbalizes understanding of plan of care.  Vitals:   03/13/22 1408  BP: (!) 143/98  Pulse: 95  Resp: (!) 22  Temp: 98.2 F (36.8 C)  SpO2: 93%

## 2022-03-13 NOTE — ED Provider Triage Note (Signed)
Emergency Medicine Provider Triage Evaluation Note  JAMISHA HOESCHEN , a 56 y.o. female  was evaluated in triage.  Pt complains of sore throat onset 6 days. Pt was evaluated at Urgent Care and was told to come into the ED for further evaluation of her symptoms. Had a negative strep at urgent care today. No meds tried PTA. Has associated painful swallowing. Denies trouble swallowing, shortness of breath, fever, chills.     Review of Systems  Positive: As per HPI above Negative:   Physical Exam  BP (!) 158/98 (BP Location: Right Wrist)   Pulse 96   Temp 98.3 F (36.8 C) (Oral)   Resp (!) 24   LMP 05/09/2012   SpO2 95%  Gen:   Awake, no distress   Resp:  Normal effort  MSK:   Moves extremities without difficulty  Other:  Uvula midline without swelling.  Tonsils approximately 3+ and symmetric.   Medical Decision Making  Medically screening exam initiated at 3:23 PM.  Appropriate orders placed.  [name] was informed that the remainder of the evaluation will be completed by another provider, this initial triage assessment does not replace that evaluation, and the importance of remaining in the ED until their evaluation is complete.  3:32 PM - Discussed with RN that patient is in need of a room immediately. RN aware and working on room placement.  Work-up initiated   Prentice Sackrider A, PA-C 03/13/22 1532

## 2022-03-13 NOTE — Discharge Instructions (Addendum)
I have given you the information for a ear nose and throat doctor.  Please call to make an appointment.  Please take the antibiotics for the entire course as prescribed.  Your blood sugar will likely increase for the next week based on the steroid dose that you were given in the ER today.  Drink plenty of water monitor your blood sugars and touch base with your primary care doctor early next week for a checkup.  You may always return to the emergency room for any new or concerning symptoms.

## 2022-03-13 NOTE — ED Provider Notes (Signed)
[place]    CSN: [id number] Arrival date & time: 03/13/22  1330      History   Chief Complaint Chief Complaint  Patient presents with   Sore Throat    HPI Claudia [name] is a 56 y.o. female presenting with sore throat and voice changes. History STEMI, morbid obesity. Describes sore throat, pain with swallowing, voice changes. Denies SOB, sensation of throat closing, CP, dizziness, weakness. States neck feels swollen. OTC medications providing minimal relief.   HPI  Past Medical History:  Diagnosis Date   Diabetes mellitus    Fibroids    Headache(784.0)    History of echocardiogram    Echo 7/18: Moderate concentric LVH, EF 55-60, normal wall motion, grade 1 diastolic dysfunction, calcified aortic leaflets   Hypercholesterolemia    Hyperlipidemia    Hypertension    Osteoarthritis    , with mild meniscus tear, followed by orthopedics--Dr Gladstone Lighter 04/29/10   Right knee pain    Seizures ([place])    as child   STEMI (ST elevation myocardial infarction) ([place])    Vitamin D deficiency     Patient Active Problem List   Diagnosis Date Noted   Sepsis ([place]) 01/20/2022   Community acquired pneumonia of right middle lobe of lung    Acute respiratory failure with hypoxia (HCC)    PSVT (paroxysmal supraventricular tachycardia) ([place]) 10/17/2021   Hemoglobin A1c greater than 9.0% 10/04/2019   Weight loss 10/04/2019   Hx of non-ST elevation myocardial infarction (NSTEMI) 06/16/2019   Class 3 severe obesity due to excess calories with serious comorbidity and body mass index (BMI) of 45.0 to 49.9 in adult ([place]) 06/16/2019   Hyperglycemia 06/16/2019   Hypertensive heart disease with systolic congestive heart failure ([place])    ACS (acute coronary syndrome) ([place]) 04/24/2018   Chronic pain of left knee 07/15/2017   Chronic pain of right knee 07/15/2017   Unilateral primary osteoarthritis, left knee 07/15/2017   Unilateral primary osteoarthritis, right knee 07/15/2017   Chest  pain    Radiculopathy 09/07/2013   Hypokalemia 06/02/2012   HTN (hypertension) 06/01/2012   DM (diabetes mellitus) ([place]) 06/01/2012   Hyperlipidemia 06/01/2012   Dyspnea on exertion 06/01/2012   Morbid obesity ([place]) 06/01/2012    Past Surgical History:  Procedure Laterality Date   ANTERIOR CERVICAL DECOMP/DISCECTOMY FUSION N/A 09/07/2013   Procedure: ANTERIOR CERVICAL DECOMPRESSION/DISCECTOMY FUSION 1 LEVEL;  Surgeon: Sinclair Ship, MD;  Location: [facility];  Service: Orthopedics;  Laterality: N/A;  Anterior cervical decompression fusion, cervical 5-6 with instrumentation and allograft   LAPAROSCOPIC ASSISTED VAGINAL HYSTERECTOMY  1/14   LEFT HEART CATH AND CORONARY ANGIOGRAPHY N/A 04/25/2018   Procedure: LEFT HEART CATH AND CORONARY ANGIOGRAPHY;  Surgeon: Lorretta Harp, MD;  Location: [facility];  Service: Cardiovascular;  Laterality: N/A;   SHOULDER SURGERY Right 11   rotator cuff   TUBAL LIGATION     UMBILICAL HERNIA REPAIR      OB History     Gravida  4   Para  4   Term  3   Preterm  1   AB      Living  3      SAB      IAB      Ectopic      Multiple      Live Births               Home Medications    Prior to Admission medications   Medication  Sig Start Date End Date Taking? Authorizing Provider  acetaminophen (TYLENOL) 500 MG tablet Take 500 mg by mouth every 6 (six) hours as needed for mild pain or moderate pain.    [provider]  amLODipine (NORVASC) 5 MG tablet Take 2 tablets (10 mg total) by mouth daily. 02/03/22 05/04/22  Bo Merino I, NP  aspirin 81 MG EC tablet Take 1 tablet (81 mg total) by mouth daily. 10/04/19   Azzie Glatter, FNP  benzonatate (TESSALON) 100 MG capsule Take 1 capsule (100 mg total) by mouth 3 (three) times daily. 01/25/22   Patrecia Pour, MD  blood glucose meter kit and supplies KIT Dispense based on patient and insurance preference. Use up to four times daily as directed. (FOR ICD-9 250.00,  250.01). 10/03/19   Azzie Glatter, FNP  cefdinir (OMNICEF) 300 MG capsule Take 1 capsule (300 mg total) by mouth 2 (two) times daily. 01/25/22   Patrecia Pour, MD  diltiazem (CARDIZEM CD) 120 MG 24 hr capsule Take 1 capsule (120 mg total) by mouth daily. 10/17/21   Jerline Pain, MD  ferrous sulfate 325 (65 FE) MG tablet Take 1 tablet (325 mg total) by mouth 2 (two) times daily. 07/01/21   Vevelyn Francois, NP  glipiZIDE (GLUCOTROL) 10 MG tablet Take 1 tablet (10 mg total) by mouth 2 (two) times daily before a meal. 08/25/21 08/25/22  Vevelyn Francois, NP  glucose blood (ONETOUCH VERIO) test strip Use 1 strip to check blood glucose 4 (four) times daily. 08/25/21 08/25/22  Vevelyn Francois, NP  hydrALAZINE (APRESOLINE) 25 MG tablet Take 1 tablet (25 mg total) by mouth every 8 (eight) hours. 08/25/21   Vevelyn Francois, NP  hydrochlorothiazide (HYDRODIURIL) 25 MG tablet Take 1 tablet (25 mg total) by mouth daily. 08/25/21 08/25/22  Vevelyn Francois, NP  losartan (COZAAR) 100 MG tablet Take 0.5 tablets (50 mg total) by mouth daily. 08/25/21 08/25/22  Vevelyn Francois, NP  metFORMIN (GLUCOPHAGE) 1000 MG tablet Take 1 tablet (1,000 mg total) by mouth 2 (two) times daily with a meal. 08/25/21   Vevelyn Francois, NP  Omega-3 Fatty Acids (FISH OIL) 1000 MG CAPS Take by mouth.     [provider]  spironolactone (ALDACTONE) 25 MG tablet Take 1 tablet (25 mg total) by mouth daily. 08/25/21 08/25/22  Vevelyn Francois, NP    Family History Family History  Problem Relation Age of Onset   Hypertension Mother    Diabetes Mother    Hypercholesterolemia Mother    Hypertension Sister    CVA Maternal Grandmother    Hypertension Maternal Grandmother    Diabetes Maternal Grandmother    Heart attack Neg Hx     Social History Social History   Tobacco Use   Smoking status: Never   Smokeless tobacco: Never  Vaping Use   Vaping Use: Never used  Substance Use Topics   Alcohol use: No   Drug use: No      Allergies   Beef-derived products, Crestor [rosuvastatin], Hydrocodone, Other, Oxycodone, Pork-derived products, Pravachol [pravastatin sodium], and Simvastatin   Review of Systems Review of Systems  HENT:  Positive for sore throat.   All other systems reviewed and are negative.    Physical Exam Triage Vital Signs ED Triage Vitals  Enc Vitals Group     BP 03/13/22 1408 (!) 143/98     Pulse Rate 03/13/22 1408 95     Resp 03/13/22 1408 (!) 22  Temp 03/13/22 1408 98.2 F (36.8 C)     Temp Source 03/13/22 1408 Oral     SpO2 03/13/22 1408 93 %     Weight --      Height --      Head Circumference --      Peak Flow --      Pain Score 03/13/22 1406 8     Pain Loc --      Pain Edu? --      Excl. in [place]? --    No data found.  Updated Vital Signs BP (!) 143/98 (BP Location: Left Arm)   Pulse 95   Temp 98.2 F (36.8 C) (Oral)   Resp (!) 22   LMP 05/09/2012   SpO2 93%   Visual Acuity Right Eye Distance:   Left Eye Distance:   Bilateral Distance:    Right Eye Near:   Left Eye Near:    Bilateral Near:     Physical Exam Vitals reviewed.  Constitutional:      Appearance: Normal appearance. She is not ill-appearing.  HENT:     Head: Normocephalic and atraumatic.     Right Ear: Hearing, tympanic membrane, ear canal and external ear normal. No swelling or tenderness. No middle ear effusion. There is no impacted cerumen. No mastoid tenderness. Tympanic membrane is not injected, scarred, perforated, erythematous, retracted or bulging.     Left Ear: Hearing, tympanic membrane, ear canal and external ear normal. No swelling or tenderness.  No middle ear effusion. There is no impacted cerumen. No mastoid tenderness. Tympanic membrane is not injected, scarred, perforated, erythematous, retracted or bulging.     Mouth/Throat:     Pharynx: Oropharynx is clear. Posterior oropharyngeal erythema present. No oropharyngeal exudate.     Tonsils: 3+ on the right. 3+ on the left.      Comments: Hot potato voice. Tonsils are large and appear distended forward.  Cardiovascular:     Rate and Rhythm: Normal rate and regular rhythm.     Heart sounds: Normal heart sounds.  Pulmonary:     Effort: Pulmonary effort is normal.     Breath sounds: Normal breath sounds.  Lymphadenopathy:     Cervical: Cervical adenopathy present.  Neurological:     General: No focal deficit present.     Mental Status: She is alert and oriented to person, place, and time.  Psychiatric:        Mood and Affect: Mood normal.        Behavior: Behavior normal.        Thought Content: Thought content normal.        Judgment: Judgment normal.      UC Treatments / Results  Labs (all labs ordered are listed, but only abnormal results are displayed) Labs Reviewed  CULTURE, GROUP A STREP [facility])  POCT RAPID STREP A, ED / UC    EKG   Radiology No results found.  Procedures Procedures (including critical care time)  Medications Ordered in UC Medications - No data to display  Initial Impression / Assessment and Plan / UC Course  I have reviewed the triage vital signs and the nursing notes.  Pertinent labs & imaging results that were available during my care of the patient were reviewed by me and considered in my medical decision making (see chart for details).     This patient is a very pleasant 56 y.o. year old female presenting with voice changes and posterior pharyngeal swelling following sore throat x1 week.  Afebrile and nontachycardic but borderline tachypneic. Rapid strep negative. I have concern for retropharygeal abscess. Sent to ED via POV, she is in agreement.   Final Clinical Impressions(s) / UC Diagnoses   Final diagnoses:  Retropharyngeal abscess     Discharge Instructions      -I am concerned that you have an abscess, or pocket of pus and infection, in the back of your throat.  This is an emergent medical condition as it can compromise your airway.  This requires  imaging that we cannot perform at urgent care.  If you do have an infection, this requires interventions like drainage that we can also not perform at urgent care.  Please head straight there, call 911 if symptoms get worse on the way.   ED Prescriptions   None    PDMP not reviewed this encounter.   Hazel Sams, PA-C 03/13/22 1502

## 2022-03-13 NOTE — Discharge Instructions (Addendum)
-  I am concerned that you have an abscess, or pocket of pus and infection, in the back of your throat.  This is an emergent medical condition as it can compromise your airway.  This requires imaging that we cannot perform at urgent care.  If you do have an infection, this requires interventions like drainage that we can also not perform at urgent care.  Please head straight there, call 911 if symptoms get worse on the way.

## 2022-03-13 NOTE — ED Provider Notes (Signed)
[facility] EMERGENCY DEPARTMENT Provider Note   CSN: [id number] Arrival date & time: 03/13/22  1507     History {Add pertinent medical, surgical, social history, OB history to HPI:1} Chief Complaint  Patient presents with  . Sore Throat    Claudia [name] is a 56 y.o. female.   Sore Throat      Home Medications Prior to Admission medications   Medication Sig Start Date End Date Taking? Authorizing Provider  acetaminophen (TYLENOL) 500 MG tablet Take 500 mg by mouth every 6 (six) hours as needed for mild pain or moderate pain.    [provider]  amLODipine (NORVASC) 5 MG tablet Take 2 tablets (10 mg total) by mouth daily. 02/03/22 05/04/22  Bo Merino I, NP  aspirin 81 MG EC tablet Take 1 tablet (81 mg total) by mouth daily. 10/04/19   Azzie Glatter, FNP  benzonatate (TESSALON) 100 MG capsule Take 1 capsule (100 mg total) by mouth 3 (three) times daily. 01/25/22   Patrecia Pour, MD  blood glucose meter kit and supplies KIT Dispense based on patient and insurance preference. Use up to four times daily as directed. (FOR ICD-9 250.00, 250.01). 10/03/19   Azzie Glatter, FNP  cefdinir (OMNICEF) 300 MG capsule Take 1 capsule (300 mg total) by mouth 2 (two) times daily. 01/25/22   Patrecia Pour, MD  diltiazem (CARDIZEM CD) 120 MG 24 hr capsule Take 1 capsule (120 mg total) by mouth daily. 10/17/21   Jerline Pain, MD  ferrous sulfate 325 (65 FE) MG tablet Take 1 tablet (325 mg total) by mouth 2 (two) times daily. 07/01/21   Vevelyn Francois, NP  glipiZIDE (GLUCOTROL) 10 MG tablet Take 1 tablet (10 mg total) by mouth 2 (two) times daily before a meal. 08/25/21 08/25/22  Vevelyn Francois, NP  glucose blood (ONETOUCH VERIO) test strip Use 1 strip to check blood glucose 4 (four) times daily. 08/25/21 08/25/22  Vevelyn Francois, NP  hydrALAZINE (APRESOLINE) 25 MG tablet Take 1 tablet (25 mg total) by mouth every 8 (eight) hours. 08/25/21   Vevelyn Francois, NP   hydrochlorothiazide (HYDRODIURIL) 25 MG tablet Take 1 tablet (25 mg total) by mouth daily. 08/25/21 08/25/22  Vevelyn Francois, NP  losartan (COZAAR) 100 MG tablet Take 0.5 tablets (50 mg total) by mouth daily. 08/25/21 08/25/22  Vevelyn Francois, NP  metFORMIN (GLUCOPHAGE) 1000 MG tablet Take 1 tablet (1,000 mg total) by mouth 2 (two) times daily with a meal. 08/25/21   Vevelyn Francois, NP  Omega-3 Fatty Acids (FISH OIL) 1000 MG CAPS Take by mouth.     [provider]  spironolactone (ALDACTONE) 25 MG tablet Take 1 tablet (25 mg total) by mouth daily. 08/25/21 08/25/22  Vevelyn Francois, NP      Allergies    Beef-derived products, Crestor [rosuvastatin], Hydrocodone, Other, Oxycodone, Pork-derived products, Pravachol [pravastatin sodium], and Simvastatin    Review of Systems   Review of Systems  Physical Exam Updated Vital Signs BP (!) 162/101   Pulse 87   Temp 98.3 F (36.8 C) (Oral)   Resp 16   LMP 05/09/2012   SpO2 91%  Physical Exam  ED Results / Procedures / Treatments   Labs (all labs ordered are listed, but only abnormal results are displayed) Labs Reviewed  GROUP A STREP BY PCR - Abnormal; Notable for the following components:      Result Value   Group A Strep  by PCR DETECTED (*)    All other components within normal limits  BASIC METABOLIC PANEL - Abnormal; Notable for the following components:   Chloride 97 (*)    Glucose, Bld 184 (*)    All other components within normal limits  CBC WITH DIFFERENTIAL/PLATELET - Abnormal; Notable for the following components:   RBC 5.68 (*)    MCV 73.1 (*)    MCH 21.7 (*)    MCHC 29.6 (*)    RDW 16.3 (*)    Platelets 404 (*)    Monocytes Absolute 1.2 (*)    All other components within normal limits    EKG None  Radiology CT Maxillofacial W Contrast  Result Date: 03/13/2022 CLINICAL DATA:  Concern for retropharyngeal abscess EXAM: CT MAXILLOFACIAL WITH CONTRAST TECHNIQUE: Multidetector CT imaging of the  maxillofacial structures was performed with intravenous contrast. Multiplanar CT image reconstructions were also generated. RADIATION DOSE REDUCTION: This exam was performed according to the departmental dose-optimization program which includes automated exposure control, adjustment of the mA and/or kV according to patient size and/or use of iterative reconstruction technique. CONTRAST:  12mL OMNIPAQUE IOHEXOL 300 MG/ML  SOLN COMPARISON:  None Available. FINDINGS: Osseous: No fracture or mandibular dislocation. No destructive process. Orbits: Negative. No traumatic or inflammatory finding. Sinuses: Small mucous retention cysts in the maxillary sinuses, left-greater-than-right. Mild mucosal thickening in the left maxillary sinus and left sphenoid sinus. Soft tissues: Enlargement of the pharyngeal tonsils and, to a lesser extent, palatine tonsils. No peritonsillar low-density collection to suggest abscess or phlegmon. Prominent bilateral level 2A lymph nodes, favored to be reactive; no low-density lymph nodes. No evidence of soft tissue abnormality in the face. Limited intracranial: No significant or unexpected finding. IMPRESSION: 1. In the partially imaged pharynx, there is enlargement of the pharyngeal and palatine tonsils, concerning for pharyngitis and/or tonsillitis. No evidence of peritonsillar abscess or phlegmon. Adjacent prominent lymph nodes are favored to be reactive. Although this study did cover the region of interest, please note that a CT neck with contrast would offer a more complete evaluation. 2. No evidence of facial bone fracture or facial soft tissue abnormality. Electronically Signed   By: Merilyn Baba M.D.   On: 03/13/2022 17:09    Procedures Procedures  {Document cardiac monitor, telemetry assessment procedure when appropriate:1}  Medications Ordered in ED Medications  iohexol (OMNIPAQUE) 300 MG/ML solution 75 mL (75 mLs Intravenous Contrast Given 03/13/22 1637)  dexamethasone  (DECADRON) injection 8 mg (8 mg Intravenous Given 03/13/22 2047)  ketorolac (TORADOL) 15 MG/ML injection 15 mg (15 mg Intravenous Given 03/13/22 2046)  lactated ringers bolus 1,000 mL (1,000 mLs Intravenous New Bag/Given 03/13/22 2043)  clindamycin (CLEOCIN) capsule 600 mg (600 mg Oral Given 03/13/22 2046)    ED Course/ Medical Decision Making/ A&P Clinical Course as of 03/13/22 2148  Fri Mar 13, 2022  2124 Group A Strep by PCR(!): DETECTED [WF]    Clinical Course User Index [WF] Tedd Sias, PA                           Medical Decision Making Risk Prescription drug management.   ***  {Document critical care time when appropriate:1} {Document review of labs and clinical decision tools ie heart score, Chads2Vasc2 etc:1}  {Document your independent review of radiology images, and any outside records:1} {Document your discussion with family members, caretakers, and with consultants:1} {Document social determinants of health affecting pt's care:1} {Document your decision making why or  why not admission, treatments were needed:1} Final Clinical Impression(s) / ED Diagnoses Final diagnoses:  None    Rx / DC Orders ED Discharge Orders     None

## 2022-03-15 LAB — CULTURE, GROUP A STREP (THRC)

## 2022-03-17 ENCOUNTER — Other Ambulatory Visit: Payer: Self-pay

## 2022-03-24 ENCOUNTER — Institutional Professional Consult (permissible substitution): Payer: 59 | Admitting: Primary Care

## 2022-03-24 NOTE — Progress Notes (Deleted)
@Patient  ID: Claudia [name], female    DOB: 07/03/1966, 56 y.o.   MRN: [id number]  No chief complaint on file.   Referring provider: Bo Merino I, NP  HPI: 56 year old female, never smoked.  Past medical history significant for NSTEMI, hypertension, paroxysmal SVT, combined congestive heart failure, community-acquired pneumonia, acute respiratory failure with hypoxia, diabetes mellitus, obesity.  Admitted in April 2023 for acute hypoxic respiratory failure, sepsis due to right upper lobe community-acquired pneumonia.  03/24/2022 Patient presents today for sleep consult.     Sleep questionnaire Symptoms-  *** Prior sleep study- *** Bedtime- *** Time to fall asleep- ***  Nocturnal awakenings- *** Out of bed/start of day- *** Weight changes- *** Do you operate heavy machinery- *** Do you currently wear CPAP- *** Do you current wear oxygen- *** [place]- ***   Allergies  Allergen Reactions   Beef-Derived Products     Patient does not eat beef   Crestor [Rosuvastatin]     MUSCLE ACHES HEADACHES    Hydrocodone Itching   Other Hives, Swelling and Other (See Comments)    Vision changes; ALLERGIC TO ALL NUTS EXCEPT PEANUTS   Oxycodone Itching   Pork-Derived Products     For dietary, patient does not eat pork   Pravachol [Pravastatin Sodium] Itching    Joint pain   Simvastatin Hives    Immunization History  Administered Date(s) Administered   Pneumococcal Polysaccharide-23 03/01/2017   Tdap 03/01/2017    Past Medical History:  Diagnosis Date   Diabetes mellitus    Fibroids    Headache(784.0)    History of echocardiogram    Echo 7/18: Moderate concentric LVH, EF 55-60, normal wall motion, grade 1 diastolic dysfunction, calcified aortic leaflets   Hypercholesterolemia    Hyperlipidemia    Hypertension    Osteoarthritis    , with mild meniscus tear, followed by orthopedics--Dr Gladstone Lighter 04/29/10   Right knee pain    Seizures ([place])    as child   STEMI (ST  elevation myocardial infarction) ([place])    Vitamin D deficiency     Tobacco History: Social History   Tobacco Use  Smoking Status Never  Smokeless Tobacco Never   Counseling given: Not Answered   Outpatient Medications Prior to Visit  Medication Sig Dispense Refill   acetaminophen (TYLENOL) 500 MG tablet Take 500 mg by mouth every 6 (six) hours as needed for mild pain or moderate pain.     amLODipine (NORVASC) 5 MG tablet Take 2 tablets (10 mg total) by mouth daily. 60 tablet 2   aspirin 81 MG EC tablet Take 1 tablet (81 mg total) by mouth daily. 30 tablet 6   benzonatate (TESSALON) 100 MG capsule Take 1 capsule (100 mg total) by mouth 3 (three) times daily. 20 capsule 0   blood glucose meter kit and supplies KIT Dispense based on patient and insurance preference. Use up to four times daily as directed. (FOR ICD-9 250.00, 250.01). 1 each 0   cefdinir (OMNICEF) 300 MG capsule Take 1 capsule (300 mg total) by mouth 2 (two) times daily. 7 capsule 0   diltiazem (CARDIZEM CD) 120 MG 24 hr capsule Take 1 capsule (120 mg total) by mouth daily. 90 capsule 3   ferrous sulfate 325 (65 FE) MG tablet Take 1 tablet (325 mg total) by mouth 2 (two) times daily. 60 tablet 6   glipiZIDE (GLUCOTROL) 10 MG tablet Take 1 tablet (10 mg total) by mouth 2 (two) times daily before a meal. 180 tablet  3   glucose blood (ONETOUCH VERIO) test strip Use 1 strip to check blood glucose 4 (four) times daily. 360 each 3   hydrALAZINE (APRESOLINE) 25 MG tablet Take 1 tablet (25 mg total) by mouth every 8 (eight) hours. 270 each 3   hydrochlorothiazide (HYDRODIURIL) 25 MG tablet Take 1 tablet (25 mg total) by mouth daily. 90 tablet 3   losartan (COZAAR) 100 MG tablet Take 0.5 tablets (50 mg total) by mouth daily. 45 tablet 3   metFORMIN (GLUCOPHAGE) 1000 MG tablet Take 1 tablet (1,000 mg total) by mouth 2 (two) times daily with a meal. 180 tablet 3   Omega-3 Fatty Acids (FISH OIL) 1000 MG CAPS Take by mouth.       spironolactone (ALDACTONE) 25 MG tablet Take 1 tablet (25 mg total) by mouth daily. 90 tablet 3   No facility-administered medications prior to visit.      Review of Systems  Review of Systems   Physical Exam  LMP 05/09/2012  Physical Exam   Lab Results:  CBC    Component Value Date/Time   WBC 10.4 03/13/2022 1540   RBC 5.68 (H) 03/13/2022 1540   HGB 12.3 03/13/2022 1540   HGB 12.2 02/03/2022 1425   HCT 41.5 03/13/2022 1540   HCT 40.0 02/03/2022 1425   PLT 404 (H) 03/13/2022 1540   PLT 534 (H) 02/03/2022 1425   MCV 73.1 (L) 03/13/2022 1540   MCV 73 (L) 02/03/2022 1425   MCH 21.7 (L) 03/13/2022 1540   MCHC 29.6 (L) 03/13/2022 1540   RDW 16.3 (H) 03/13/2022 1540   RDW 15.7 (H) 02/03/2022 1425   LYMPHSABS 3.7 03/13/2022 1540   LYMPHSABS 2.8 02/03/2022 1425   MONOABS 1.2 (H) 03/13/2022 1540   EOSABS 0.3 03/13/2022 1540   EOSABS 0.2 02/03/2022 1425   BASOSABS 0.0 03/13/2022 1540   BASOSABS 0.0 02/03/2022 1425    BMET    Component Value Date/Time   NA 138 03/13/2022 1540   NA 141 02/03/2022 1425   K 4.2 03/13/2022 1540   CL 97 (L) 03/13/2022 1540   CO2 31 03/13/2022 1540   GLUCOSE 184 (H) 03/13/2022 1540   BUN 13 03/13/2022 1540   BUN 15 02/03/2022 1425   CREATININE 0.79 03/13/2022 1540   CREATININE 0.74 07/07/2017 1557   CALCIUM 9.4 03/13/2022 1540   GFRNONAA >60 03/13/2022 1540   GFRNONAA 95 07/07/2017 1557   GFRAA 86 10/04/2020 1607   GFRAA 110 07/07/2017 1557    BNP    Component Value Date/Time   BNP 6.2 02/25/2017 1536    ProBNP    Component Value Date/Time   PROBNP 12 04/12/2017 0839   PROBNP 420.2 (H) 06/01/2012 1648    Imaging: CT Maxillofacial W Contrast  Result Date: 03/13/2022 CLINICAL DATA:  Concern for retropharyngeal abscess EXAM: CT MAXILLOFACIAL WITH CONTRAST TECHNIQUE: Multidetector CT imaging of the maxillofacial structures was performed with intravenous contrast. Multiplanar CT image reconstructions were also generated.  RADIATION DOSE REDUCTION: This exam was performed according to the departmental dose-optimization program which includes automated exposure control, adjustment of the mA and/or kV according to patient size and/or use of iterative reconstruction technique. CONTRAST:  36m OMNIPAQUE IOHEXOL 300 MG/ML  SOLN COMPARISON:  None Available. FINDINGS: Osseous: No fracture or mandibular dislocation. No destructive process. Orbits: Negative. No traumatic or inflammatory finding. Sinuses: Small mucous retention cysts in the maxillary sinuses, left-greater-than-right. Mild mucosal thickening in the left maxillary sinus and left sphenoid sinus. Soft tissues: Enlargement of the pharyngeal  tonsils and, to a lesser extent, palatine tonsils. No peritonsillar low-density collection to suggest abscess or phlegmon. Prominent bilateral level 2A lymph nodes, favored to be reactive; no low-density lymph nodes. No evidence of soft tissue abnormality in the face. Limited intracranial: No significant or unexpected finding. IMPRESSION: 1. In the partially imaged pharynx, there is enlargement of the pharyngeal and palatine tonsils, concerning for pharyngitis and/or tonsillitis. No evidence of peritonsillar abscess or phlegmon. Adjacent prominent lymph nodes are favored to be reactive. Although this study did cover the region of interest, please note that a CT neck with contrast would offer a more complete evaluation. 2. No evidence of facial bone fracture or facial soft tissue abnormality. Electronically Signed   By: Merilyn Baba M.D.   On: 03/13/2022 17:09     Assessment & Plan:   No problem-specific Assessment & Plan notes found for this encounter.     Martyn Ehrich, NP 03/24/2022

## 2022-05-07 ENCOUNTER — Ambulatory Visit: Payer: 59 | Admitting: Nurse Practitioner

## 2022-05-22 ENCOUNTER — Other Ambulatory Visit: Payer: Self-pay

## 2022-06-18 ENCOUNTER — Ambulatory Visit: Payer: 59 | Admitting: Nurse Practitioner

## 2022-06-22 ENCOUNTER — Ambulatory Visit: Payer: 59 | Admitting: Physician Assistant

## 2022-07-01 ENCOUNTER — Telehealth: Payer: Self-pay

## 2022-07-01 DIAGNOSIS — D649 Anemia, unspecified: Secondary | ICD-10-CM

## 2022-07-01 MED ORDER — FERROUS SULFATE 325 (65 FE) MG PO TABS: 325.0000 mg | ORAL_TABLET | Freq: Two times a day (BID) | ORAL | 6 refills | Status: DC

## 2022-07-01 NOTE — Telephone Encounter (Signed)
No additional notes needed  

## 2022-07-20 ENCOUNTER — Encounter: Payer: Self-pay | Admitting: Physician Assistant

## 2022-07-20 ENCOUNTER — Ambulatory Visit: Payer: Commercial Managed Care - HMO | Attending: Physician Assistant | Admitting: Physician Assistant

## 2022-07-20 VITALS — BP 146/96 | HR 100 | Ht 68.0 in | Wt 288.4 lb

## 2022-07-20 DIAGNOSIS — I5022 Chronic systolic (congestive) heart failure: Secondary | ICD-10-CM | POA: Diagnosis not present

## 2022-07-20 DIAGNOSIS — I471 Supraventricular tachycardia, unspecified: Secondary | ICD-10-CM

## 2022-07-20 DIAGNOSIS — I110 Hypertensive heart disease with heart failure: Secondary | ICD-10-CM | POA: Diagnosis not present

## 2022-07-20 NOTE — Progress Notes (Signed)
Cardiology Office Note:    Date:  07/20/2022   ID:  Claudia [name], DOB 18-Nov-1965, MRN [id number]  PCP:  Fenton Foy, NP  [facility] Providers Cardiologist:  Candee Furbish, MD  Referring MD: Fenton Foy, NP   Chief Complaint:  Follow-up for hypertension and SVT    Patient Profile: HFimpEF (heart failure with improved ejection fraction)  Echo 2013: EF 40-45 (in setting of HTN urgency) Echo 7/18: EF 55-60 Echo 7/19: EF 55-60 Cath 7/19: Normal coronary arteries Diabetes mellitus  Supraventricular Tachycardia  Intol to Toprol due to dizziness  Hyperlipidemia  Hypertension  Obesity   Prior CV Studies:   Cardiac catheterization 04/25/2018 Normal coronary arteries  Echocardiogram 04/25/2018 Mild concentric LVH, EF 55-60, normal wall motion, mild LAE, trivial PI  Echocardiogram 04/20/2017 Moderate concentric LVH, EF 55-60, normal wall motion, G1 DD  Echocardiogram 03/04/2015 Normal EF, GR 1 DD  History of Present Illness:   Claudia [name] is a 56 y.o. female with the above problem list.  She was last seen by Dr. Marlou Porch in Jan 2023. She had recently been dx with Supraventricular Tachycardia. She could not tolerate Toprol due to dizziness and was started on Diltiazem.  She is here today with her husband.  She stopped taking diltiazem.  She notes that it caused hives and itchiness.  She has not had recurrent palpitations since then.  She has not had chest pain, significant shortness of breath, syncope.        Past Medical History:  Diagnosis Date   Diabetes mellitus    Fibroids    Headache(784.0)    History of echocardiogram    Echo 7/18: Moderate concentric LVH, EF 55-60, normal wall motion, grade 1 diastolic dysfunction, calcified aortic leaflets   Hypercholesterolemia    Hyperlipidemia    Hypertension    Osteoarthritis    , with mild meniscus tear, followed by orthopedics--Dr Gladstone Lighter 04/29/10   Right knee pain    Seizures ([place])    as child   STEMI (ST  elevation myocardial infarction) ([place])    Vitamin D deficiency    Current Medications: Current Meds  Medication Sig   acetaminophen (TYLENOL) 500 MG tablet Take 500 mg by mouth every 6 (six) hours as needed for mild pain or moderate pain.   amLODipine (NORVASC) 5 MG tablet Take 2 tablets (10 mg total) by mouth daily.   aspirin 81 MG EC tablet Take 1 tablet (81 mg total) by mouth daily.   blood glucose meter kit and supplies KIT Dispense based on patient and insurance preference. Use up to four times daily as directed. (FOR ICD-9 250.00, 250.01).   cefdinir (OMNICEF) 300 MG capsule Take 1 capsule (300 mg total) by mouth 2 (two) times daily.   ferrous sulfate 325 (65 FE) MG tablet Take 1 tablet (325 mg total) by mouth 2 (two) times daily.   glipiZIDE (GLUCOTROL) 10 MG tablet Take 1 tablet (10 mg total) by mouth 2 (two) times daily before a meal.   glucose blood (ONETOUCH VERIO) test strip Use 1 strip to check blood glucose 4 (four) times daily.   hydrALAZINE (APRESOLINE) 25 MG tablet Take 1 tablet (25 mg total) by mouth every 8 (eight) hours.   hydrochlorothiazide (HYDRODIURIL) 25 MG tablet Take 1 tablet (25 mg total) by mouth daily.   losartan (COZAAR) 100 MG tablet Take 0.5 tablets (50 mg total) by mouth daily.   metFORMIN (GLUCOPHAGE) 1000 MG tablet Take 1 tablet (1,000 mg total) by  mouth 2 (two) times daily with a meal.   Omega-3 Fatty Acids (FISH OIL) 1000 MG CAPS Take by mouth.    spironolactone (ALDACTONE) 25 MG tablet Take 1 tablet (25 mg total) by mouth daily.   [DISCONTINUED] diltiazem (CARDIZEM CD) 120 MG 24 hr capsule Take 1 capsule (120 mg total) by mouth daily.    Allergies:   Beef-derived products, Crestor [rosuvastatin], Diltiazem, Hydrocodone, Other, Oxycodone, Pork-derived products, Pravachol [pravastatin sodium], and Simvastatin   Social History   Tobacco Use   Smoking status: Never   Smokeless tobacco: Never  Vaping Use   Vaping Use: Never used  Substance Use Topics    Alcohol use: No   Drug use: No    Family Hx: The patient's family history includes CVA in her maternal grandmother; Diabetes in her maternal grandmother and mother; Hypercholesterolemia in her mother; Hypertension in her maternal grandmother, mother, and sister. There is no history of Heart attack.  Review of Systems  Gastrointestinal:  Negative for hematochezia.  Genitourinary:  Negative for hematuria.     EKGs/Labs/Other Test Reviewed:    EKG:  EKG is not ordered today.  Recent Labs: 08/25/2021: TSH 0.677 02/03/2022: ALT 21 03/13/2022: BUN 13; Creatinine, Ser 0.79; Hemoglobin 12.3; Platelets 404; Potassium 4.2; Sodium 138   Recent Lipid Panel Recent Labs    08/25/21 0943  CHOL 219*  TRIG 96  HDL 57  LDLCALC 145*     Risk Assessment/Calculations/Metrics:         HYPERTENSION CONTROL Vitals:   07/20/22 1511 07/20/22 1816  BP: (!) 160/90 (!) 146/96    The patient's blood pressure is elevated above target today.  In order to address the patient's elevated BP: Blood pressure will be monitored at home to determine if medication changes need to be made.       Physical Exam:    VS:  BP (!) 146/96   Pulse 100   Ht 5' 8"  (1.727 m)   Wt 288 lb 6.4 oz (130.8 kg)   LMP 05/09/2012   SpO2 93%   BMI 43.85 kg/m     Wt Readings from Last 3 Encounters:  07/20/22 288 lb 6.4 oz (130.8 kg)  02/03/22 291 lb 6.4 oz (132.2 kg)  01/20/22 297 lb 2.9 oz (134.8 kg)    Constitutional:      Appearance: Healthy appearance. Not in distress.  Neck:     Vascular: JVD normal.  Pulmonary:     Effort: Pulmonary effort is normal.     Breath sounds: No wheezing. No rales.  Cardiovascular:     Normal rate. Regular rhythm. Normal S1. Normal S2.      Murmurs: There is no murmur.  Edema:    Peripheral edema absent.  Abdominal:     Palpations: Abdomen is soft.  Skin:    General: Skin is warm and dry.  Neurological:     Mental Status: Alert and oriented to person, place and time.         ASSESSMENT & PLAN:   Hypertensive heart disease with systolic congestive heart failure (HCC) Blood pressure is uncontrolled.  However, she has been out of amlodipine and losartan for several days.  Her pharmacy was unable to get it.  She is picking it up later today.  I have asked her to resume her amlodipine 10 mg daily and losartan 50 mg daily.  Continue HCTZ 25 mg daily, spironolactone 25 mg daily and hydralazine 25 mg 3 times a day.  I have asked her  to monitor her blood pressure for 2 weeks and send his readings through MyChart for review.  If her blood pressure remains above target, consider increasing hydralazine to 50 mg 3 times a day or losartan to 100 mg a day.  Follow-up in 6 months.  PSVT (paroxysmal supraventricular tachycardia) As noted, she cannot tolerate diltiazem.  She reports having hives and itching.  She also could not tolerate metoprolol succinate.  She notes that she was taking some type of stimulant to help lose weight around the time that the palpitations started.  She has not had further palpitations since then.  She can continue to use Valsalva maneuvers if she has recurrent symptoms.  If she is interested, we can also give her as needed beta-blocker to use as needed if she has sustained symptoms.            Dispo:  Return in about 6 months (around 01/19/2023) for Routine Follow Up with Dr. Marlou Porch.   Medication Adjustments/Labs and Tests Ordered: Current medicines are reviewed at length with the patient today.  Concerns regarding medicines are outlined above.  Tests Ordered: No orders of the defined types were placed in this encounter.  Medication Changes: No orders of the defined types were placed in this encounter.  Signed, Richardson Dopp, PA-C  07/20/2022 6:23 PM    [address], [place], [place]  [id number] Phone: [phone]; Fax: [phone]

## 2022-07-20 NOTE — Assessment & Plan Note (Signed)
As noted, she cannot tolerate diltiazem.  She reports having hives and itching.  She also could not tolerate metoprolol succinate.  She notes that she was taking some type of stimulant to help lose weight around the time that the palpitations started.  She has not had further palpitations since then.  She can continue to use Valsalva maneuvers if she has recurrent symptoms.  If she is interested, we can also give her as needed beta-blocker to use as needed if she has sustained symptoms.

## 2022-07-20 NOTE — Patient Instructions (Signed)
Medication Instructions:  Your physician recommends that you continue on your current medications as directed. Please refer to the Current Medication list given to you today.  *If you need a refill on your cardiac medications before your next appointment, please call your pharmacy*   Lab Work: None ordered  If you have labs (blood work) drawn today and your tests are completely normal, you will receive your results only by: [place] (if you have MyChart) OR A paper copy in the mail If you have any lab test that is abnormal or we need to change your treatment, we will call you to review the results.   Testing/Procedures: None ordered   Follow-Up: At [facility], you and your health needs are our priority.  As part of our continuing mission to provide you with exceptional heart care, we have created designated Provider Care Teams.  These Care Teams include your primary Cardiologist (physician) and Advanced Practice Providers (APPs -  Physician Assistants and Nurse Practitioners) who all work together to provide you with the care you need, when you need it.  We recommend signing up for the patient portal called "MyChart".  Sign up information is provided on this After Visit Summary.  MyChart is used to connect with patients for Virtual Visits (Telemedicine).  Patients are able to view lab/test results, encounter notes, upcoming appointments, etc.  Non-urgent messages can be sent to your provider as well.   To learn more about what you can do with MyChart, go to [email or web address].    Your next appointment:   6 month(s)  The format for your next appointment:   In Person  Provider:   Candee Furbish, MD     Other Instructions Your physician has requested that you regularly monitor and record your blood pressure readings at home. Please use the same machine at the same time of day to check your readings and record them to bring to your follow-up visit.   Please  monitor blood pressures and keep a log of your readings for 2 weeks then send in a mychart message.  [facility] LOGIN:  [id number] PASSWORD:  [id number]     Make sure to check 2 hours after your medications.    AVOID these things for 30 minutes before checking your blood pressure: No Drinking caffeine. No Drinking alcohol. No Eating. No Smoking. No Exercising.   Five minutes before checking your blood pressure: Pee. Sit in a dining chair. Avoid sitting in a soft couch or armchair. Be quiet. Do not talk   Important Information About Sugar

## 2022-07-20 NOTE — Assessment & Plan Note (Signed)
Blood pressure is uncontrolled.  However, she has been out of amlodipine and losartan for several days.  Her pharmacy was unable to get it.  She is picking it up later today.  I have asked her to resume her amlodipine 10 mg daily and losartan 50 mg daily.  Continue HCTZ 25 mg daily, spironolactone 25 mg daily and hydralazine 25 mg 3 times a day.  I have asked her to monitor her blood pressure for 2 weeks and send his readings through MyChart for review.  If her blood pressure remains above target, consider increasing hydralazine to 50 mg 3 times a day or losartan to 100 mg a day.  Follow-up in 6 months.

## 2022-07-21 ENCOUNTER — Encounter: Payer: Self-pay | Admitting: *Deleted

## 2022-07-21 ENCOUNTER — Telehealth: Payer: Self-pay | Admitting: *Deleted

## 2022-07-21 NOTE — Telephone Encounter (Signed)
-----   Message from Liliane Shi, [place] sent at 07/20/2022  6:19 PM EDT ----- Can you call the patient and remind her to use Valsalva maneuvers if she has recurrent rapid palpitations in the future?  Also, I can give her a prescription for metoprolol tartrate to take as needed if she had recurrent rapid palpitations that did not resolve with Valsalva maneuvers.  If she would like to have this, let me know. Thanks [employer]

## 2022-07-21 NOTE — Telephone Encounter (Signed)
Call placed to pt regarding message below.  Left pt a message to call back, and also have sent her a mychart message.

## 2022-08-11 ENCOUNTER — Emergency Department (HOSPITAL_COMMUNITY): Payer: Commercial Managed Care - HMO

## 2022-08-11 ENCOUNTER — Encounter (HOSPITAL_COMMUNITY): Payer: Self-pay

## 2022-08-11 ENCOUNTER — Other Ambulatory Visit: Payer: Self-pay

## 2022-08-11 ENCOUNTER — Inpatient Hospital Stay (HOSPITAL_COMMUNITY)
Admission: EM | Admit: 2022-08-11 | Discharge: 2022-08-17 | DRG: 291 | Disposition: A | Discharge status: Home / Self Care | Payer: Commercial Managed Care - HMO | Attending: Internal Medicine | Admitting: Internal Medicine

## 2022-08-11 ENCOUNTER — Encounter: Payer: Self-pay | Admitting: *Deleted

## 2022-08-11 DIAGNOSIS — E7800 Pure hypercholesterolemia, unspecified: Secondary | ICD-10-CM | POA: Diagnosis present

## 2022-08-11 DIAGNOSIS — I110 Hypertensive heart disease with heart failure: Principal | ICD-10-CM | POA: Diagnosis present

## 2022-08-11 DIAGNOSIS — E1165 Type 2 diabetes mellitus with hyperglycemia: Secondary | ICD-10-CM | POA: Diagnosis present

## 2022-08-11 DIAGNOSIS — Z885 Allergy status to narcotic agent status: Secondary | ICD-10-CM

## 2022-08-11 DIAGNOSIS — I471 Supraventricular tachycardia, unspecified: Secondary | ICD-10-CM | POA: Diagnosis present

## 2022-08-11 DIAGNOSIS — I252 Old myocardial infarction: Secondary | ICD-10-CM

## 2022-08-11 DIAGNOSIS — E669 Obesity, unspecified: Secondary | ICD-10-CM | POA: Diagnosis present

## 2022-08-11 DIAGNOSIS — Z7982 Long term (current) use of aspirin: Secondary | ICD-10-CM

## 2022-08-11 DIAGNOSIS — E876 Hypokalemia: Secondary | ICD-10-CM | POA: Diagnosis present

## 2022-08-11 DIAGNOSIS — N179 Acute kidney failure, unspecified: Secondary | ICD-10-CM | POA: Diagnosis present

## 2022-08-11 DIAGNOSIS — I509 Heart failure, unspecified: Secondary | ICD-10-CM | POA: Diagnosis not present

## 2022-08-11 DIAGNOSIS — J9601 Acute respiratory failure with hypoxia: Secondary | ICD-10-CM | POA: Diagnosis present

## 2022-08-11 DIAGNOSIS — Z823 Family history of stroke: Secondary | ICD-10-CM

## 2022-08-11 DIAGNOSIS — I5033 Acute on chronic diastolic (congestive) heart failure: Secondary | ICD-10-CM | POA: Diagnosis present

## 2022-08-11 DIAGNOSIS — D649 Anemia, unspecified: Secondary | ICD-10-CM

## 2022-08-11 DIAGNOSIS — E1169 Type 2 diabetes mellitus with other specified complication: Secondary | ICD-10-CM | POA: Diagnosis present

## 2022-08-11 DIAGNOSIS — Z6841 Body Mass Index (BMI) 40.0 and over, adult: Secondary | ICD-10-CM

## 2022-08-11 DIAGNOSIS — Z1152 Encounter for screening for COVID-19: Secondary | ICD-10-CM

## 2022-08-11 DIAGNOSIS — D509 Iron deficiency anemia, unspecified: Secondary | ICD-10-CM | POA: Diagnosis present

## 2022-08-11 DIAGNOSIS — Z888 Allergy status to other drugs, medicaments and biological substances status: Secondary | ICD-10-CM

## 2022-08-11 DIAGNOSIS — Z7984 Long term (current) use of oral hypoglycemic drugs: Secondary | ICD-10-CM

## 2022-08-11 DIAGNOSIS — Z886 Allergy status to analgesic agent status: Secondary | ICD-10-CM

## 2022-08-11 DIAGNOSIS — Z833 Family history of diabetes mellitus: Secondary | ICD-10-CM

## 2022-08-11 DIAGNOSIS — Z79899 Other long term (current) drug therapy: Secondary | ICD-10-CM

## 2022-08-11 DIAGNOSIS — Z91014 Allergy to mammalian meats: Secondary | ICD-10-CM

## 2022-08-11 DIAGNOSIS — I10 Essential (primary) hypertension: Secondary | ICD-10-CM | POA: Diagnosis present

## 2022-08-11 DIAGNOSIS — E66812 Obesity, class 2: Secondary | ICD-10-CM | POA: Diagnosis present

## 2022-08-11 DIAGNOSIS — E785 Hyperlipidemia, unspecified: Secondary | ICD-10-CM | POA: Diagnosis present

## 2022-08-11 DIAGNOSIS — Z8249 Family history of ischemic heart disease and other diseases of the circulatory system: Secondary | ICD-10-CM

## 2022-08-11 DIAGNOSIS — I272 Pulmonary hypertension, unspecified: Secondary | ICD-10-CM | POA: Diagnosis present

## 2022-08-11 DIAGNOSIS — Z83438 Family history of other disorder of lipoprotein metabolism and other lipidemia: Secondary | ICD-10-CM

## 2022-08-11 DIAGNOSIS — E119 Type 2 diabetes mellitus without complications: Secondary | ICD-10-CM

## 2022-08-11 LAB — COMPREHENSIVE METABOLIC PANEL
ALT: 49 U/L — ABNORMAL HIGH (ref 0–44)
AST: 24 U/L (ref 15–41)
Albumin: 3.4 g/dL — ABNORMAL LOW (ref 3.5–5.0)
Alkaline Phosphatase: 46 U/L (ref 38–126)
Anion gap: 10 (ref 5–15)
BUN: 17 mg/dL (ref 6–20)
CO2: 33 mmol/L — ABNORMAL HIGH (ref 22–32)
Calcium: 8.9 mg/dL (ref 8.9–10.3)
Chloride: 92 mmol/L — ABNORMAL LOW (ref 98–111)
Creatinine, Ser: 1.48 mg/dL — ABNORMAL HIGH (ref 0.44–1.00)
GFR, Estimated: 42 mL/min — ABNORMAL LOW (ref >60)
Glucose, Bld: 178 mg/dL — ABNORMAL HIGH (ref 70–99)
Potassium: 4 mmol/L (ref 3.5–5.1)
Sodium: 135 mmol/L (ref 135–145)
Total Bilirubin: 0.7 mg/dL (ref 0.3–1.2)
Total Protein: 6.9 g/dL (ref 6.5–8.1)

## 2022-08-11 LAB — CBC WITH DIFFERENTIAL/PLATELET
Abs Immature Granulocytes: 0.02 10*3/uL (ref 0.00–0.07)
Basophils Absolute: 0 10*3/uL (ref 0.0–0.1)
Basophils Relative: 0 %
Eosinophils Absolute: 0.1 10*3/uL (ref 0.0–0.5)
Eosinophils Relative: 2 %
HCT: 47.5 % — ABNORMAL HIGH (ref 36.0–46.0)
Hemoglobin: 13.5 g/dL (ref 12.0–15.0)
Immature Granulocytes: 0 %
Lymphocytes Relative: 28 %
Lymphs Abs: 2 10*3/uL (ref 0.7–4.0)
MCH: 20.7 pg — ABNORMAL LOW (ref 26.0–34.0)
MCHC: 28.4 g/dL — ABNORMAL LOW (ref 30.0–36.0)
MCV: 72.7 fL — ABNORMAL LOW (ref 80.0–100.0)
Monocytes Absolute: 0.8 10*3/uL (ref 0.1–1.0)
Monocytes Relative: 10 %
Neutro Abs: 4.3 10*3/uL (ref 1.7–7.7)
Neutrophils Relative %: 60 %
Platelets: 453 10*3/uL — ABNORMAL HIGH (ref 150–400)
RBC: 6.53 MIL/uL — ABNORMAL HIGH (ref 3.87–5.11)
RDW: 17.7 % — ABNORMAL HIGH (ref 11.5–15.5)
WBC: 7.2 10*3/uL (ref 4.0–10.5)
nRBC: 0 % (ref 0.0–0.2)

## 2022-08-11 LAB — LACTIC ACID, PLASMA: Lactic Acid, Venous: 1 mmol/L (ref 0.5–1.9)

## 2022-08-11 LAB — TROPONIN I (HIGH SENSITIVITY): Troponin I (High Sensitivity): 30 ng/L — ABNORMAL HIGH (ref <18)

## 2022-08-11 LAB — RESP PANEL BY RT-PCR (FLU A&B, COVID) ARPGX2
Influenza A by PCR: NEGATIVE
Influenza B by PCR: NEGATIVE
SARS Coronavirus 2 by RT PCR: NEGATIVE

## 2022-08-11 LAB — I-STAT BETA HCG BLOOD, ED (MC, WL, AP ONLY): I-stat hCG, quantitative: <5 m[IU]/mL (ref <5)

## 2022-08-11 LAB — D-DIMER, QUANTITATIVE: D-Dimer, Quant: 0.34 ug/mL-FEU (ref 0.00–0.50)

## 2022-08-11 MED ORDER — SODIUM CHLORIDE 0.9 % IV BOLUS: 1000.0000 mL | Freq: Once | INTRAVENOUS | Status: DC

## 2022-08-11 NOTE — ED Provider Triage Note (Signed)
Emergency Medicine Provider Triage Evaluation Note  Claudia [name] , a 56 y.o. female  was evaluated in triage.  Pt complains of SOBx2 weeks. Reports fatigue. Denies fever, chills. Endorses chest tightness.   Review of Systems  Positive: SOB Negative: Fever  Physical Exam  LMP 05/09/2012  Gen:   Awake, no distress   Resp:  Normal effort, 2L O2 MSK:   Moves extremities without difficulty   Medical Decision Making  Medically screening exam initiated at 7:23 PM.  Appropriate orders placed.  [name] was informed that the remainder of the evaluation will be completed by another provider, this initial triage assessment does not replace that evaluation, and the importance of remaining in the ED until their evaluation is complete.     Osvaldo Shipper, [place] 08/11/22 1934

## 2022-08-11 NOTE — Telephone Encounter (Signed)
2nd attempt to reach pt regarding message below.  Left another message for pt to call back.  Will also send her a [employer].

## 2022-08-11 NOTE — ED Triage Notes (Signed)
Pt arrives via [facility] from [facility] for increased weakness and SOB for two weeks. Pt initially 80% on RA on arrival, but improved to 96% on 5 lpm O2 via [place]. Pt weaned to 2 lpm by arrival.

## 2022-08-12 DIAGNOSIS — I5033 Acute on chronic diastolic (congestive) heart failure: Secondary | ICD-10-CM

## 2022-08-12 DIAGNOSIS — I509 Heart failure, unspecified: Secondary | ICD-10-CM

## 2022-08-12 LAB — CBG MONITORING, ED
Glucose-Capillary: 124 mg/dL — ABNORMAL HIGH (ref 70–99)
Glucose-Capillary: 232 mg/dL — ABNORMAL HIGH (ref 70–99)

## 2022-08-12 LAB — IRON AND TIBC
Iron: 33 ug/dL (ref 28–170)
Saturation Ratios: 6 % — ABNORMAL LOW (ref 10.4–31.8)
TIBC: 557 ug/dL — ABNORMAL HIGH (ref 250–450)
UIBC: 524 ug/dL

## 2022-08-12 LAB — CBC
HCT: 51.4 % — ABNORMAL HIGH (ref 36.0–46.0)
Hemoglobin: 14.3 g/dL (ref 12.0–15.0)
MCH: 20.5 pg — ABNORMAL LOW (ref 26.0–34.0)
MCHC: 27.8 g/dL — ABNORMAL LOW (ref 30.0–36.0)
MCV: 73.6 fL — ABNORMAL LOW (ref 80.0–100.0)
Platelets: 386 10*3/uL (ref 150–400)
RBC: 6.98 MIL/uL — ABNORMAL HIGH (ref 3.87–5.11)
RDW: 18.3 % — ABNORMAL HIGH (ref 11.5–15.5)
WBC: 5.5 10*3/uL (ref 4.0–10.5)
nRBC: 0 % (ref 0.0–0.2)

## 2022-08-12 LAB — CREATININE, SERUM
Creatinine, Ser: 1.11 mg/dL — ABNORMAL HIGH (ref 0.44–1.00)
GFR, Estimated: 59 mL/min — ABNORMAL LOW (ref >60)

## 2022-08-12 LAB — GLUCOSE, CAPILLARY: Glucose-Capillary: 190 mg/dL — ABNORMAL HIGH (ref 70–99)

## 2022-08-12 LAB — FERRITIN: Ferritin: 9 ng/mL — ABNORMAL LOW (ref 11–307)

## 2022-08-12 LAB — TROPONIN I (HIGH SENSITIVITY): Troponin I (High Sensitivity): 18 ng/L — ABNORMAL HIGH (ref <18)

## 2022-08-12 LAB — BRAIN NATRIURETIC PEPTIDE: B Natriuretic Peptide: 138.5 pg/mL — ABNORMAL HIGH (ref 0.0–100.0)

## 2022-08-12 LAB — HEMOGLOBIN A1C
Hgb A1c MFr Bld: 9.3 % — ABNORMAL HIGH (ref 4.8–5.6)
Mean Plasma Glucose: 220.21 mg/dL

## 2022-08-12 LAB — MAGNESIUM: Magnesium: 1.9 mg/dL (ref 1.7–2.4)

## 2022-08-12 MED ORDER — INSULIN ASPART 100 UNIT/ML IJ SOLN
0.0000 [IU] | Freq: Every day | INTRAMUSCULAR | Status: DC
  Administered 2022-08-13: 3 [IU] via SUBCUTANEOUS

## 2022-08-12 MED ORDER — IPRATROPIUM-ALBUTEROL 0.5-2.5 (3) MG/3ML IN SOLN: 3.0000 mL | RESPIRATORY_TRACT | Status: DC | PRN

## 2022-08-12 MED ORDER — ASPIRIN 81 MG PO TBEC
81.0000 mg | DELAYED_RELEASE_TABLET | Freq: Every day | ORAL | Status: DC
  Administered 2022-08-12 – 2022-08-17 (×6): 81 mg via ORAL
  Filled 2022-08-12 (×6): qty 1

## 2022-08-12 MED ORDER — FUROSEMIDE 10 MG/ML IJ SOLN
80.0000 mg | Freq: Once | INTRAMUSCULAR | Status: AC
  Administered 2022-08-12: 80 mg via INTRAVENOUS
  Filled 2022-08-12: qty 8

## 2022-08-12 MED ORDER — TRAZODONE HCL 50 MG PO TABS
50.0000 mg | ORAL_TABLET | Freq: Every evening | ORAL | Status: DC | PRN
  Administered 2022-08-13: 50 mg via ORAL
  Filled 2022-08-12 (×2): qty 1

## 2022-08-12 MED ORDER — ACETAMINOPHEN 325 MG PO TABS
650.0000 mg | ORAL_TABLET | Freq: Four times a day (QID) | ORAL | Status: DC | PRN
  Administered 2022-08-13 – 2022-08-17 (×9): 650 mg via ORAL
  Filled 2022-08-12 (×9): qty 2

## 2022-08-12 MED ORDER — ENOXAPARIN SODIUM 80 MG/0.8ML IJ SOSY
0.5000 mg/kg | PREFILLED_SYRINGE | INTRAMUSCULAR | Status: DC
  Administered 2022-08-12: 67.5 mg via SUBCUTANEOUS
  Filled 2022-08-12: qty 0.68

## 2022-08-12 MED ORDER — GUAIFENESIN 100 MG/5ML PO LIQD: 5.0000 mL | ORAL | Status: DC | PRN

## 2022-08-12 MED ORDER — METOPROLOL TARTRATE 5 MG/5ML IV SOLN: 5.0000 mg | INTRAVENOUS | Status: DC | PRN

## 2022-08-12 MED ORDER — FERROUS SULFATE 325 (65 FE) MG PO TABS
325.0000 mg | ORAL_TABLET | Freq: Two times a day (BID) | ORAL | Status: DC
  Administered 2022-08-12 – 2022-08-17 (×10): 325 mg via ORAL
  Filled 2022-08-12 (×10): qty 1

## 2022-08-12 MED ORDER — ONDANSETRON HCL 4 MG/2ML IJ SOLN: 4.0000 mg | Freq: Four times a day (QID) | INTRAMUSCULAR | Status: DC | PRN

## 2022-08-12 MED ORDER — SENNOSIDES-DOCUSATE SODIUM 8.6-50 MG PO TABS: 1.0000 | ORAL_TABLET | Freq: Every evening | ORAL | Status: DC | PRN

## 2022-08-12 MED ORDER — HYDRALAZINE HCL 25 MG PO TABS
25.0000 mg | ORAL_TABLET | Freq: Three times a day (TID) | ORAL | Status: DC
  Administered 2022-08-12 – 2022-08-13 (×2): 25 mg via ORAL
  Filled 2022-08-12 (×2): qty 1

## 2022-08-12 MED ORDER — HYDRALAZINE HCL 20 MG/ML IJ SOLN: 10.0000 mg | INTRAMUSCULAR | Status: DC | PRN

## 2022-08-12 MED ORDER — MAGNESIUM SULFATE 2 GM/50ML IV SOLN
2.0000 g | Freq: Once | INTRAVENOUS | Status: AC
  Administered 2022-08-12: 2 g via INTRAVENOUS
  Filled 2022-08-12: qty 50

## 2022-08-12 MED ORDER — INSULIN GLARGINE-YFGN 100 UNIT/ML ~~LOC~~ SOLN
5.0000 [IU] | Freq: Every day | SUBCUTANEOUS | Status: DC
  Administered 2022-08-12 – 2022-08-17 (×6): 5 [IU] via SUBCUTANEOUS
  Filled 2022-08-12 (×6): qty 0.05

## 2022-08-12 MED ORDER — FUROSEMIDE 10 MG/ML IJ SOLN: 40.0000 mg | Freq: Two times a day (BID) | INTRAMUSCULAR | Status: DC

## 2022-08-12 MED ORDER — AMLODIPINE BESYLATE 10 MG PO TABS
10.0000 mg | ORAL_TABLET | Freq: Every day | ORAL | Status: DC
  Administered 2022-08-12 – 2022-08-13 (×2): 10 mg via ORAL
  Filled 2022-08-12: qty 2
  Filled 2022-08-12: qty 1

## 2022-08-12 MED ORDER — FUROSEMIDE 10 MG/ML IJ SOLN
40.0000 mg | Freq: Two times a day (BID) | INTRAMUSCULAR | Status: DC
  Administered 2022-08-13: 40 mg via INTRAVENOUS
  Filled 2022-08-12: qty 4

## 2022-08-12 MED ORDER — INSULIN ASPART 100 UNIT/ML IJ SOLN
0.0000 [IU] | Freq: Three times a day (TID) | INTRAMUSCULAR | Status: DC
  Administered 2022-08-12: 5 [IU] via SUBCUTANEOUS
  Administered 2022-08-13: 3 [IU] via SUBCUTANEOUS
  Administered 2022-08-13: 5 [IU] via SUBCUTANEOUS
  Administered 2022-08-14 (×2): 3 [IU] via SUBCUTANEOUS
  Administered 2022-08-14 – 2022-08-15 (×2): 5 [IU] via SUBCUTANEOUS
  Administered 2022-08-15: 2 [IU] via SUBCUTANEOUS
  Administered 2022-08-15: 5 [IU] via SUBCUTANEOUS
  Administered 2022-08-16 (×2): 3 [IU] via SUBCUTANEOUS
  Administered 2022-08-16: 5 [IU] via SUBCUTANEOUS

## 2022-08-12 NOTE — H&P (Signed)
History and Physical    Claudia [name] CBS:[id number] DOB: 1966-09-26 DOA: 08/11/2022  PCP: Fenton Foy, NP Patient coming from: Home  Chief Complaint: Shortness of breath  HPI: Claudia [name] is a 56 y.o. female with medical history significant of morbid obesity with BMI greater than 40, HTN, diastolic CHF, DM 2, iron deficiency anemia comes to the hospital with complaints of shortness of breath.  Patient states for the past several weeks she has had exertional shortness of breath and some orthopnea.  Reports that anytime she tries to get around or even move around in her bed she will get short of breath.  During this time off-and-on she has had bilateral lower extremity swelling which comes and goes and often experiences some lightheadedness and dizziness.  Denies any chest pain, fevers, chills or other complaints.  She admits of medication compliance.  In the ED she was initially noted to be hypoxic saturating 84% on room air requiring 2 L nasal cannula.  First set of troponin was 30, EKG does not show any acute ST-T changes, chest x-ray was suggestive of pulmonary vascular congestion, D-dimer was negative.  Creatinine was elevated at 1.48, baseline 0.9.  COVID/flu was negative.  She was given 80 mg of IV Lasix.  EDP also discussed case with cardiology (not sure who) who recommended trending troponin and if elevated they will officially consult.  When I saw the patient she was comfortable at rest and was on 2 L nasal cannula.  Denied any chest pain or other complaints.  Social history-denies any tobacco, alcohol and illicit drug use   Review of Systems: As per HPI otherwise 10 point review of systems negative.  Review of Systems Otherwise negative except as per HPI, including: General: Denies fever, chills, night sweats or unintended weight loss. Resp: Denies cough, wheezing, shortness of breath. Cardiac: Denies orthopnea, paroxysmal nocturnal dyspnea. GI: Denies abdominal pain,  nausea, vomiting, diarrhea or constipation GU: Denies dysuria, frequency, hesitancy or incontinence MS: Denies muscle aches, joint pain or swelling Neuro: Denies headache, neurologic deficits (focal weakness, numbness, tingling), abnormal gait Psych: Denies anxiety, depression, SI/HI/AVH Skin: Denies new rashes or lesions ID: Denies sick contacts, exotic exposures, travel  Past Medical History:  Diagnosis Date   Diabetes mellitus    Fibroids    Headache(784.0)    History of echocardiogram    Echo 7/18: Moderate concentric LVH, EF 55-60, normal wall motion, grade 1 diastolic dysfunction, calcified aortic leaflets   Hypercholesterolemia    Hyperlipidemia    Hypertension    Osteoarthritis    , with mild meniscus tear, followed by orthopedics--Dr Gladstone Lighter 04/29/10   Right knee pain    Seizures ([place])    as child   STEMI (ST elevation myocardial infarction) ([place])    Vitamin D deficiency     Past Surgical History:  Procedure Laterality Date   ANTERIOR CERVICAL DECOMP/DISCECTOMY FUSION N/A 09/07/2013   Procedure: ANTERIOR CERVICAL DECOMPRESSION/DISCECTOMY FUSION 1 LEVEL;  Surgeon: Sinclair Ship, MD;  Location: [facility];  Service: Orthopedics;  Laterality: N/A;  Anterior cervical decompression fusion, cervical 5-6 with instrumentation and allograft   LAPAROSCOPIC ASSISTED VAGINAL HYSTERECTOMY  1/14   LEFT HEART CATH AND CORONARY ANGIOGRAPHY N/A 04/25/2018   Procedure: LEFT HEART CATH AND CORONARY ANGIOGRAPHY;  Surgeon: Lorretta Harp, MD;  Location: [facility];  Service: Cardiovascular;  Laterality: N/A;   SHOULDER SURGERY Right 11   rotator cuff   TUBAL LIGATION     UMBILICAL HERNIA REPAIR  SOCIAL HISTORY:  reports that she has never smoked. She has never used smokeless tobacco. She reports that she does not drink alcohol and does not use drugs.  Allergies  Allergen Reactions   Beef-Derived Products     Patient does not eat beef   Crestor [Rosuvastatin]      MUSCLE ACHES HEADACHES    Diltiazem Hives   Hydrocodone Itching   Other Hives, Swelling and Other (See Comments)    Vision changes; ALLERGIC TO ALL NUTS EXCEPT PEANUTS   Oxycodone Itching   Pork-Derived Products     For dietary, patient does not eat pork   Pravachol [Pravastatin Sodium] Itching    Joint pain   Simvastatin Hives    FAMILY HISTORY: Family History  Problem Relation Age of Onset   Hypertension Mother    Diabetes Mother    Hypercholesterolemia Mother    Hypertension Sister    CVA Maternal Grandmother    Hypertension Maternal Grandmother    Diabetes Maternal Grandmother    Heart attack Neg Hx      Prior to Admission medications   Medication Sig Start Date End Date Taking? Authorizing Provider  amLODipine (NORVASC) 5 MG tablet Take 2 tablets (10 mg total) by mouth daily. 02/03/22 08/12/22 Yes Passmore, Jake Church I, NP  aspirin 81 MG EC tablet Take 1 tablet (81 mg total) by mouth daily. 10/04/19  Yes Azzie Glatter, FNP  ferrous sulfate 325 (65 FE) MG tablet Take 1 tablet (325 mg total) by mouth 2 (two) times daily. 07/01/22  Yes Fenton Foy, NP  glipiZIDE (GLUCOTROL) 10 MG tablet Take 1 tablet (10 mg total) by mouth 2 (two) times daily before a meal. 08/25/21 08/25/22 Yes King, Diona Foley, NP  hydrALAZINE (APRESOLINE) 25 MG tablet Take 1 tablet (25 mg total) by mouth every 8 (eight) hours. 08/25/21  Yes Vevelyn Francois, NP  hydrochlorothiazide (HYDRODIURIL) 25 MG tablet Take 1 tablet (25 mg total) by mouth daily. 08/25/21 08/25/22 Yes Vevelyn Francois, NP  losartan (COZAAR) 100 MG tablet Take 0.5 tablets (50 mg total) by mouth daily. 08/25/21 08/25/22 Yes Vevelyn Francois, NP  metFORMIN (GLUCOPHAGE) 1000 MG tablet Take 1 tablet (1,000 mg total) by mouth 2 (two) times daily with a meal. 08/25/21  Yes Vevelyn Francois, NP  Omega-3 Fatty Acids (FISH OIL) 1000 MG CAPS Take by mouth.    Yes [provider]  OVER THE COUNTER MEDICATION Take 1 capsule by mouth daily.  Herb Pill -- unknown name   Yes [provider]  spironolactone (ALDACTONE) 25 MG tablet Take 1 tablet (25 mg total) by mouth daily. 08/25/21 08/25/22 Yes Vevelyn Francois, NP  acetaminophen (TYLENOL) 500 MG tablet Take 500 mg by mouth every 6 (six) hours as needed for mild pain or moderate pain.    [provider]  blood glucose meter kit and supplies KIT Dispense based on patient and insurance preference. Use up to four times daily as directed. (FOR ICD-9 250.00, 250.01). 10/03/19   Azzie Glatter, FNP  glucose blood [facility] VERIO) test strip Use 1 strip to check blood glucose 4 (four) times daily. 08/25/21 08/25/22  Vevelyn Francois, NP    Physical Exam: Vitals:   08/12/22 1405 08/12/22 1407 08/12/22 1408 08/12/22 1408  BP: (!) 148/84     Pulse: 87     Resp: 14     Temp:      TempSrc:      SpO2: 96% (!) 88% 90% 93%  Weight:      Height:          Constitutional: NAD, calm, comfortable, morbid obesity Eyes: PERRL, lids and conjunctivae normal ENMT: Mucous membranes are moist. Posterior pharynx clear of any exudate or lesions.Normal dentition.  Neck: normal, supple, no masses, no thyromegaly Respiratory: Some bibasilar crackles heard but overall diminished breath sounds bilaterally Cardiovascular: Regular rate and rhythm, no murmurs / rubs / gallops. No extremity edema. 2+ pedal pulses. No carotid bruits.  1+ bilateral lower extremity pitting edema Abdomen: no tenderness, no masses palpated. No hepatosplenomegaly. Bowel sounds positive.  Musculoskeletal: no clubbing / cyanosis. No joint deformity upper and lower extremities. Good ROM, no contractures. Normal muscle tone.  Skin: no rashes, lesions, ulcers. No induration Neurologic: CN 2-12 grossly intact. Sensation intact, DTR normal. Strength 5/5 in all 4.  Psychiatric: Normal judgment and insight. Alert and oriented x 3. Normal mood.     Labs on Admission: I have personally reviewed following labs and imaging  studies  CBC: Recent Labs  Lab 08/11/22 1956  WBC 7.2  NEUTROABS 4.3  HGB 13.5  HCT 47.5*  MCV 72.7*  PLT 209*   Basic Metabolic Panel: Recent Labs  Lab 08/11/22 1956  NA 135  K 4.0  CL 92*  CO2 33*  GLUCOSE 178*  BUN 17  CREATININE 1.48*  CALCIUM 8.9   GFR: Estimated Creatinine Clearance: 63.2 mL/min (A) (by C-G formula based on SCr of 1.48 mg/dL (H)). Liver Function Tests: Recent Labs  Lab 08/11/22 1956  AST 24  ALT 49*  ALKPHOS 46  BILITOT 0.7  PROT 6.9  ALBUMIN 3.4*   No results for input(s): "LIPASE", "AMYLASE" in the last 168 hours. No results for input(s): "AMMONIA" in the last 168 hours. Coagulation Profile: No results for input(s): "INR", "PROTIME" in the last 168 hours. Cardiac Enzymes: No results for input(s): "CKTOTAL", "CKMB", "CKMBINDEX", "TROPONINI" in the last 168 hours. BNP (last 3 results) No results for input(s): "PROBNP" in the last 8760 hours. HbA1C: No results for input(s): "HGBA1C" in the last 72 hours. CBG: Recent Labs  Lab 08/12/22 0323  GLUCAP 124*   Lipid Profile: No results for input(s): "CHOL", "HDL", "LDLCALC", "TRIG", "CHOLHDL", "LDLDIRECT" in the last 72 hours. Thyroid Function Tests: No results for input(s): "TSH", "T4TOTAL", "FREET4", "T3FREE", "THYROIDAB" in the last 72 hours. Anemia Panel: No results for input(s): "VITAMINB12", "FOLATE", "FERRITIN", "TIBC", "IRON", "RETICCTPCT" in the last 72 hours. Urine analysis:    Component Value Date/Time   COLORURINE YELLOW 01/19/2022 2326   APPEARANCEUR HAZY (A) 01/19/2022 2326   LABSPEC >1.030 (H) 01/19/2022 2326   PHURINE 5.5 01/19/2022 2326   GLUCOSEU >=500 (A) 01/19/2022 2326   HGBUR NEGATIVE 01/19/2022 2326   BILIRUBINUR NEGATIVE 01/19/2022 2326   BILIRUBINUR modrate 10/04/2020 1603   KETONESUR NEGATIVE 01/19/2022 2326   PROTEINUR NEGATIVE 01/19/2022 2326   UROBILINOGEN 0.2 10/04/2020 1603   UROBILINOGEN 0.2 10/13/2017 1530   NITRITE NEGATIVE 01/19/2022 2326    LEUKOCYTESUR TRACE (A) 01/19/2022 2326   Sepsis Labs: !!!!!!!!!!!!!!!!!!!!!!!!!!!!!!!!!!!!!!!!!!!! _0 (procalcitonin:4,lacticidven:4) ) Recent Results (from the past 240 hour(s))  Resp Panel by RT-PCR (Flu A&B, Covid) Anterior Nasal Swab     Status: None   Collection Time: 08/11/22  7:29 PM   Specimen: Anterior Nasal Swab  Result Value Ref Range Status   SARS Coronavirus 2 by RT PCR NEGATIVE NEGATIVE Final    Comment: (NOTE) SARS-CoV-2 target nucleic acids are NOT DETECTED.  The SARS-CoV-2 RNA is generally detectable in upper respiratory specimens during  the acute phase of infection. The lowest concentration of SARS-CoV-2 viral copies this assay can detect is 138 copies/mL. A negative result does not preclude SARS-Cov-2 infection and should not be used as the sole basis for treatment or other patient management decisions. A negative result may occur with  improper specimen collection/handling, submission of specimen other than nasopharyngeal swab, presence of viral mutation(s) within the areas targeted by this assay, and inadequate number of viral copies(<138 copies/mL). A negative result must be combined with clinical observations, patient history, and epidemiological information. The expected result is Negative.  Fact Sheet for Patients:  EntrepreneurPulse.com.au  Fact Sheet for Healthcare Providers:  IncredibleEmployment.be  This test is no t yet approved or cleared by the [place] FDA and  has been authorized for detection and/or diagnosis of SARS-CoV-2 by FDA under an Emergency Use Authorization (EUA). This EUA will remain  in effect (meaning this test can be used) for the duration of the COVID-19 declaration under Section 564(b)(1) of the Act, 21 U.S.C.section 360bbb-3(b)(1), unless the authorization is terminated  or revoked sooner.       Influenza A by PCR NEGATIVE NEGATIVE Final   Influenza B by PCR NEGATIVE NEGATIVE  Final    Comment: (NOTE) The Xpert Xpress SARS-CoV-2/FLU/RSV plus assay is intended as an aid in the diagnosis of influenza from Nasopharyngeal swab specimens and should not be used as a sole basis for treatment. Nasal washings and aspirates are unacceptable for Xpert Xpress SARS-CoV-2/FLU/RSV testing.  Fact Sheet for Patients: EntrepreneurPulse.com.au  Fact Sheet for Healthcare Providers: IncredibleEmployment.be  This test is not yet approved or cleared by the [place] FDA and has been authorized for detection and/or diagnosis of SARS-CoV-2 by FDA under an Emergency Use Authorization (EUA). This EUA will remain in effect (meaning this test can be used) for the duration of the COVID-19 declaration under Section 564(b)(1) of the Act, 21 U.S.C. section 360bbb-3(b)(1), unless the authorization is terminated or revoked.  Performed at [facility], [place] [address], [place], [place] [address]      Radiological Exams on Admission: DG Chest 2 View  Result Date: 08/11/2022 CLINICAL DATA:  SOB, hypoxic EXAM: CHEST - 2 VIEW COMPARISON:  Chest x-ray 01/20/2022, CT angio chest 01/20/2022 FINDINGS: The heart and mediastinal contours are within normal limits. Prominent hilar vasculature. Interval increase in cardiac silhouette. No focal consolidation. Mildly increased interstitial markings. No pleural effusion. No pneumothorax. No acute osseous abnormality. IMPRESSION: 1. Interval increase in cardiac silhouette. Underlying pericardial effusion not fully excluded. 2. Mild pulmonary edema. Electronically Signed   By: Iven Finn M.D.   On: 08/11/2022 21:17     All images have been reviewed by me personally.  EKG: Independently reviewed.  Nonischemic but QTc is 505  Assessment/Plan Principal Problem:   Acute exacerbation of CHF (congestive heart failure) (HCC) Active Problems:   HTN (hypertension)   DM (diabetes mellitus) ([place])    Hyperlipidemia   Morbid obesity ([place])   Hypertensive heart disease with systolic congestive heart failure (HCC)   Acute respiratory failure with hypoxia (HCC)    Acute respiratory distress with hypoxia Acute congestive heart failure with preserved ejection fraction, EF 55%.  Class III - Admit patient to the hospital.  Has some signs of volume overload therefore we will start her on Lasix 40 mg IV twice daily starting tomorrow.  She just received Lasix 80 mg IV in the ER.  Hopefully renal function will improve with diuresis.  Repeat echocardiogram.  Fluid restriction, monitor  electrolytes and replete as necessary.  Acute kidney injury - Baseline creatinine 0.9, admission creatinine 1.4.  I think this is secondary to poor renal perfusion from fluid overload, hopefully diuresis will improve creatinine.  For now we will hold off on home losartan, HCTZ and Aldactone.  Continue Lasix.  Elevated Trop Prolonged QTc, 505 -First set of troponin 30.  Suspect demand ischemia, repeat troponin has been ordered.  If continues to rise, consult cardiology.  EKG is nonischemic  Diabetes mellitus type 2 - Home metformin and glipizide on hold.  We will place her on Semglee 5 units daily, sliding scale and Accu-Chek.  Adjust as necessary  Chronic iron deficiency anemia, microcytosis - On home iron supplements.  We will check iron studies, if low we can consider IV iron while she is here.  Denies any obvious signs of blood loss.  Hemoglobin is overall stable  Essential hypertension - Currently on Norvasc, hydralazine and getting IV Lasix.  IV as needed ordered  Morbid obesity with BMI greater than 40 - Counseled on weight loss diet and exercise    DVT prophylaxis: Subcu Lovenox  Code Status: Full code Family Communication: Husband and mother at bedside Consults called: Water engineer called by EDP, who will be available for consult if Trops continue to rise.  Admission status: Obs tele  Status is:  Observation The patient remains OBS appropriate and will d/c before 2 midnights.   Time Spent: 65 minutes.  >50% of the time was devoted to discussing the patients care, assessment, plan and disposition with other care givers along with counseling the patient about the risks and benefits of treatment.    Claudia [name] Arsenio Loader MD [facility]  If 7PM-7AM, please contact night-coverage   08/12/2022, 3:34 PM

## 2022-08-12 NOTE — ED Provider Notes (Signed)
[facility] EMERGENCY DEPARTMENT Provider Note   CSN: [id number] Arrival date & time: 08/11/22  1851     History  Chief Complaint  Patient presents with   Shortness of Breath    Claudia [name] is a 56 y.o. female.   Shortness of Breath    Patient with medical history of hypertension, hyperlipidemia, diabetes, SVT, mild CHF presents today due to chest pain and shortness of breath.  Chest pain started yesterday, describes chest tightness that is resolved at this point.  She has been having shortness of breath, dyspnea exertion and orthopnea for the last 2 weeks.  Needing to sit with 3 pillows propped up.  She has had a nonproductive cough, denies any weight gain or lower extremity swelling.  She was brought by EMS, found to be hypoxic on room air, put on 5 L supplemental oxygen.  No history of prior oxygen requirement.   Home Medications Prior to Admission medications   Medication Sig Start Date End Date Taking? Authorizing Provider  amLODipine (NORVASC) 5 MG tablet Take 2 tablets (10 mg total) by mouth daily. 02/03/22 08/12/22 Yes Passmore, Jake Church I, NP  aspirin 81 MG EC tablet Take 1 tablet (81 mg total) by mouth daily. 10/04/19  Yes Azzie Glatter, FNP  ferrous sulfate 325 (65 FE) MG tablet Take 1 tablet (325 mg total) by mouth 2 (two) times daily. 07/01/22  Yes Fenton Foy, NP  glipiZIDE (GLUCOTROL) 10 MG tablet Take 1 tablet (10 mg total) by mouth 2 (two) times daily before a meal. 08/25/21 08/25/22 Yes King, Diona Foley, NP  hydrALAZINE (APRESOLINE) 25 MG tablet Take 1 tablet (25 mg total) by mouth every 8 (eight) hours. 08/25/21  Yes Vevelyn Francois, NP  hydrochlorothiazide (HYDRODIURIL) 25 MG tablet Take 1 tablet (25 mg total) by mouth daily. 08/25/21 08/25/22 Yes Vevelyn Francois, NP  losartan (COZAAR) 100 MG tablet Take 0.5 tablets (50 mg total) by mouth daily. 08/25/21 08/25/22 Yes Vevelyn Francois, NP  metFORMIN (GLUCOPHAGE) 1000 MG tablet Take 1 tablet  (1,000 mg total) by mouth 2 (two) times daily with a meal. 08/25/21  Yes Vevelyn Francois, NP  Omega-3 Fatty Acids (FISH OIL) 1000 MG CAPS Take by mouth.    Yes [provider]  OVER THE COUNTER MEDICATION Take 1 capsule by mouth daily. Herb Pill -- unknown name   Yes [provider]  spironolactone (ALDACTONE) 25 MG tablet Take 1 tablet (25 mg total) by mouth daily. 08/25/21 08/25/22 Yes Vevelyn Francois, NP  acetaminophen (TYLENOL) 500 MG tablet Take 500 mg by mouth every 6 (six) hours as needed for mild pain or moderate pain.    [provider]  blood glucose meter kit and supplies KIT Dispense based on patient and insurance preference. Use up to four times daily as directed. (FOR ICD-9 250.00, 250.01). 10/03/19   Azzie Glatter, FNP  glucose blood [facility] VERIO) test strip Use 1 strip to check blood glucose 4 (four) times daily. 08/25/21 08/25/22  Vevelyn Francois, NP      Allergies    Beef-derived products, Crestor [rosuvastatin], Diltiazem, Hydrocodone, Other, Oxycodone, Pork-derived products, Pravachol [pravastatin sodium], and Simvastatin    Review of Systems   Review of Systems  Respiratory:  Positive for shortness of breath.     Physical Exam Updated Vital Signs BP (!) 148/84   Pulse 87   Temp 98.4 F (36.9 C) (Oral)   Resp 14   Ht _0  (  1.727 m)   Wt 134.3 kg   LMP 05/09/2012   SpO2 93%   BMI 45.01 kg/m  Physical Exam Vitals and nursing note reviewed. Exam conducted with a chaperone present.  Constitutional:      Appearance: Normal appearance.  HENT:     Head: Normocephalic and atraumatic.  Eyes:     General: No scleral icterus.       Right eye: No discharge.        Left eye: No discharge.     Extraocular Movements: Extraocular movements intact.     Pupils: Pupils are equal, round, and reactive to light.  Cardiovascular:     Rate and Rhythm: Normal rate and regular rhythm.     Pulses: Normal pulses.     Heart sounds: Normal heart  sounds. No murmur heard.    No friction rub. No gallop.  Pulmonary:     Effort: Pulmonary effort is normal. No respiratory distress.     Breath sounds: Normal breath sounds. No decreased breath sounds.  Abdominal:     General: Abdomen is flat. Bowel sounds are normal. There is no distension.     Palpations: Abdomen is soft.     Tenderness: There is no abdominal tenderness.  Musculoskeletal:     Comments: Trace lower extremity edema nonpitting  Skin:    General: Skin is warm and dry.     Coloration: Skin is not jaundiced.  Neurological:     Mental Status: She is alert. Mental status is at baseline.     Coordination: Coordination normal.     ED Results / Procedures / Treatments   Labs (all labs ordered are listed, but only abnormal results are displayed) Labs Reviewed  CBC WITH DIFFERENTIAL/PLATELET - Abnormal; Notable for the following components:      Result Value   RBC 6.53 (*)    HCT 47.5 (*)    MCV 72.7 (*)    MCH 20.7 (*)    MCHC 28.4 (*)    RDW 17.7 (*)    Platelets 453 (*)    All other components within normal limits  COMPREHENSIVE METABOLIC PANEL - Abnormal; Notable for the following components:   Chloride 92 (*)    CO2 33 (*)    Glucose, Bld 178 (*)    Creatinine, Ser 1.48 (*)    Albumin 3.4 (*)    ALT 49 (*)    GFR, Estimated 42 (*)    All other components within normal limits  CBG MONITORING, ED - Abnormal; Notable for the following components:   Glucose-Capillary 124 (*)    All other components within normal limits  TROPONIN I (HIGH SENSITIVITY) - Abnormal; Notable for the following components:   Troponin I (High Sensitivity) 30 (*)    All other components within normal limits  RESP PANEL BY RT-PCR (FLU A&B, COVID) ARPGX2  LACTIC ACID, PLASMA  D-DIMER, QUANTITATIVE  BRAIN NATRIURETIC PEPTIDE  I-STAT BETA HCG BLOOD, ED ([facility], [facility], [facility] ONLY)  TROPONIN I (HIGH SENSITIVITY)    EKG EKG Interpretation  Date/Time:  Tuesday August 11 2022 20:02:34  EST Ventricular Rate:  100 PR Interval:  162 QRS Duration: 102 QT Interval:  392 QTC Calculation: 505 R Axis:   159 Text Interpretation: Sinus rhythm with occasional Premature ventricular complexes Possible Left atrial enlargement Right axis deviation Incomplete right bundle branch block Possible Right ventricular hypertrophy T wave abnormality, consider anterior ischemia Prolonged QT Abnormal ECG When compared with ECG of 19-Jan-2022 23:23, PREVIOUS ECG IS PRESENT Confirmed  by Orpah Greek [phone]) on 08/12/2022 12:41:56 PM  Radiology DG Chest 2 View  Result Date: 08/11/2022 CLINICAL DATA:  SOB, hypoxic EXAM: CHEST - 2 VIEW COMPARISON:  Chest x-ray 01/20/2022, CT angio chest 01/20/2022 FINDINGS: The heart and mediastinal contours are within normal limits. Prominent hilar vasculature. Interval increase in cardiac silhouette. No focal consolidation. Mildly increased interstitial markings. No pleural effusion. No pneumothorax. No acute osseous abnormality. IMPRESSION: 1. Interval increase in cardiac silhouette. Underlying pericardial effusion not fully excluded. 2. Mild pulmonary edema. Electronically Signed   By: Iven Finn M.D.   On: 08/11/2022 21:17    Procedures Procedures    Medications Ordered in ED Medications - No data to display   ED Course/ Medical Decision Making/ A&P Clinical Course as of 08/12/22 1503  Wed Aug 12, 2022  1410 Patient taken off supplemental oxygen, became hypoxic at 88% on room air.  Put back on 2 L supplemental oxygen [HS]    Clinical Course User Index [HS] Sherrill Raring, PA-C                           Medical Decision Making Amount and/or Complexity of Data Reviewed Labs: ordered.  Risk Prescription drug management.   Patient presents due to shortness of breath.  Differential includes but not limited to CHF, pneumonia, PE, ACS.  I viewed external medical records: Cardiac catheterization 04/25/2018 - Normal coronary arteries   Echocardiogram 04/25/2018 - Mild concentric LVH, EF 55-60, normal wall motion, mild LAE, trivial PI Echocardiogram 04/20/2017 - Moderate concentric LVH, EF 55-60, normal wall motion, G1 DD  I ordered, viewed and interpreted laboratory work-up. CBC without leukocytosis or anemia. CMP with new AKI with a creatinine 1.48.  No transaminitis, chloride mildly decreased at 92. Lactic acid and dimer ordered in triage are negative. Troponin slightly elevated at 30.  Second troponin is pending. BNP is pending.  I ordered, viewed chest x-ray and agree with radiologist.  Mild pulmonary edema concerning for possible CHF.  Patient is on cardiac monitoring in sinus rhythm from interpretation with rate of 82.  EKG shows slight QT prolongation.  Given negative dimer, not tachycardic and no pleuritic chest pain or risk factors for PE I think that is less likely.  Her symptoms seem more in line with symptomatic CHF given the orthopnea, dyspnea on exertion and signs of pulmonary edema on the chest x-ray.    Patient will need admission for additional work-up for acute respiratory failure attributable to CHF.  I will consult hospitalist for admission.  Patient has a history of SVT and QT prolongation on EKG we will proceed to check magnesium level and empirically supplement mag.  Case discussed with my attending who agrees with this plan.        Final Clinical Impression(s) / ED Diagnoses Final diagnoses:  Acute respiratory failure with hypoxia [facility])    Rx / DC Orders ED Discharge Orders     None         Sherrill Raring, [place] 08/12/22 1652    Valarie Merino, MD 08/13/22 [phone]

## 2022-08-13 ENCOUNTER — Observation Stay (HOSPITAL_COMMUNITY): Payer: Commercial Managed Care - HMO

## 2022-08-13 DIAGNOSIS — Z885 Allergy status to narcotic agent status: Secondary | ICD-10-CM | POA: Diagnosis not present

## 2022-08-13 DIAGNOSIS — I10 Essential (primary) hypertension: Secondary | ICD-10-CM

## 2022-08-13 DIAGNOSIS — I509 Heart failure, unspecified: Secondary | ICD-10-CM | POA: Insufficient documentation

## 2022-08-13 DIAGNOSIS — Z823 Family history of stroke: Secondary | ICD-10-CM | POA: Diagnosis not present

## 2022-08-13 DIAGNOSIS — Z888 Allergy status to other drugs, medicaments and biological substances status: Secondary | ICD-10-CM | POA: Diagnosis not present

## 2022-08-13 DIAGNOSIS — Z886 Allergy status to analgesic agent status: Secondary | ICD-10-CM | POA: Diagnosis not present

## 2022-08-13 DIAGNOSIS — I428 Other cardiomyopathies: Secondary | ICD-10-CM | POA: Diagnosis not present

## 2022-08-13 DIAGNOSIS — E1165 Type 2 diabetes mellitus with hyperglycemia: Secondary | ICD-10-CM | POA: Diagnosis present

## 2022-08-13 DIAGNOSIS — I252 Old myocardial infarction: Secondary | ICD-10-CM | POA: Diagnosis not present

## 2022-08-13 DIAGNOSIS — E669 Obesity, unspecified: Secondary | ICD-10-CM

## 2022-08-13 DIAGNOSIS — E7800 Pure hypercholesterolemia, unspecified: Secondary | ICD-10-CM | POA: Diagnosis present

## 2022-08-13 DIAGNOSIS — Z8249 Family history of ischemic heart disease and other diseases of the circulatory system: Secondary | ICD-10-CM | POA: Diagnosis not present

## 2022-08-13 DIAGNOSIS — Z79899 Other long term (current) drug therapy: Secondary | ICD-10-CM | POA: Diagnosis not present

## 2022-08-13 DIAGNOSIS — N179 Acute kidney failure, unspecified: Secondary | ICD-10-CM | POA: Diagnosis present

## 2022-08-13 DIAGNOSIS — Z83438 Family history of other disorder of lipoprotein metabolism and other lipidemia: Secondary | ICD-10-CM | POA: Diagnosis not present

## 2022-08-13 DIAGNOSIS — Z833 Family history of diabetes mellitus: Secondary | ICD-10-CM | POA: Diagnosis not present

## 2022-08-13 DIAGNOSIS — Z1152 Encounter for screening for COVID-19: Secondary | ICD-10-CM | POA: Diagnosis not present

## 2022-08-13 DIAGNOSIS — Z6841 Body Mass Index (BMI) 40.0 and over, adult: Secondary | ICD-10-CM | POA: Diagnosis not present

## 2022-08-13 DIAGNOSIS — J9601 Acute respiratory failure with hypoxia: Secondary | ICD-10-CM | POA: Diagnosis present

## 2022-08-13 DIAGNOSIS — I5033 Acute on chronic diastolic (congestive) heart failure: Secondary | ICD-10-CM | POA: Diagnosis present

## 2022-08-13 DIAGNOSIS — D509 Iron deficiency anemia, unspecified: Secondary | ICD-10-CM | POA: Diagnosis present

## 2022-08-13 DIAGNOSIS — E1169 Type 2 diabetes mellitus with other specified complication: Secondary | ICD-10-CM

## 2022-08-13 DIAGNOSIS — I471 Supraventricular tachycardia, unspecified: Secondary | ICD-10-CM | POA: Diagnosis present

## 2022-08-13 DIAGNOSIS — E876 Hypokalemia: Secondary | ICD-10-CM | POA: Diagnosis not present

## 2022-08-13 DIAGNOSIS — I272 Pulmonary hypertension, unspecified: Secondary | ICD-10-CM | POA: Diagnosis present

## 2022-08-13 DIAGNOSIS — E785 Hyperlipidemia, unspecified: Secondary | ICD-10-CM

## 2022-08-13 DIAGNOSIS — I110 Hypertensive heart disease with heart failure: Secondary | ICD-10-CM | POA: Diagnosis present

## 2022-08-13 DIAGNOSIS — Z91014 Allergy to mammalian meats: Secondary | ICD-10-CM | POA: Diagnosis not present

## 2022-08-13 LAB — GLUCOSE, CAPILLARY
Glucose-Capillary: 169 mg/dL — ABNORMAL HIGH (ref 70–99)
Glucose-Capillary: 217 mg/dL — ABNORMAL HIGH (ref 70–99)
Glucose-Capillary: 109 mg/dL — ABNORMAL HIGH (ref 70–99)
Glucose-Capillary: 267 mg/dL — ABNORMAL HIGH (ref 70–99)

## 2022-08-13 LAB — MAGNESIUM: Magnesium: 2.1 mg/dL (ref 1.7–2.4)

## 2022-08-13 LAB — BASIC METABOLIC PANEL
Anion gap: 8 (ref 5–15)
BUN: 19 mg/dL (ref 6–20)
CO2: 37 mmol/L — ABNORMAL HIGH (ref 22–32)
Calcium: 9 mg/dL (ref 8.9–10.3)
Chloride: 91 mmol/L — ABNORMAL LOW (ref 98–111)
Creatinine, Ser: 1.19 mg/dL — ABNORMAL HIGH (ref 0.44–1.00)
GFR, Estimated: 54 mL/min — ABNORMAL LOW (ref >60)
Glucose, Bld: 152 mg/dL — ABNORMAL HIGH (ref 70–99)
Potassium: 4.2 mmol/L (ref 3.5–5.1)
Sodium: 136 mmol/L (ref 135–145)

## 2022-08-13 LAB — CBC
HCT: 50.4 % — ABNORMAL HIGH (ref 36.0–46.0)
Hemoglobin: 14.7 g/dL (ref 12.0–15.0)
MCH: 21.3 pg — ABNORMAL LOW (ref 26.0–34.0)
MCHC: 29.2 g/dL — ABNORMAL LOW (ref 30.0–36.0)
MCV: 73.1 fL — ABNORMAL LOW (ref 80.0–100.0)
Platelets: 477 10*3/uL — ABNORMAL HIGH (ref 150–400)
RBC: 6.89 MIL/uL — ABNORMAL HIGH (ref 3.87–5.11)
RDW: 18.2 % — ABNORMAL HIGH (ref 11.5–15.5)
WBC: 6.8 10*3/uL (ref 4.0–10.5)
nRBC: 0 % (ref 0.0–0.2)

## 2022-08-13 LAB — ECHOCARDIOGRAM COMPLETE
AR max vel: 2.64 cm2
AV Peak grad: 13.8 mmHg
Ao pk vel: 1.86 m/s
Area-P 1/2: 2.93 cm2
Height: 68 in
S' Lateral: 3.1 cm
Weight: 3744 oz

## 2022-08-13 MED ORDER — LOSARTAN POTASSIUM 25 MG PO TABS
25.0000 mg | ORAL_TABLET | Freq: Every day | ORAL | Status: DC
  Administered 2022-08-14 – 2022-08-17 (×4): 25 mg via ORAL
  Filled 2022-08-13 (×4): qty 1

## 2022-08-13 MED ORDER — FUROSEMIDE 10 MG/ML IJ SOLN
60.0000 mg | Freq: Two times a day (BID) | INTRAMUSCULAR | Status: DC
  Administered 2022-08-13 – 2022-08-14 (×2): 60 mg via INTRAVENOUS
  Filled 2022-08-13 (×2): qty 6

## 2022-08-13 MED ORDER — MAGNESIUM SULFATE 4 GM/100ML IV SOLN
4.0000 g | Freq: Once | INTRAVENOUS | Status: AC
  Administered 2022-08-13: 4 g via INTRAVENOUS
  Filled 2022-08-13: qty 100

## 2022-08-13 MED ORDER — EMPAGLIFLOZIN 10 MG PO TABS
10.0000 mg | ORAL_TABLET | Freq: Every day | ORAL | Status: DC
  Administered 2022-08-13 – 2022-08-17 (×5): 10 mg via ORAL
  Filled 2022-08-13 (×5): qty 1

## 2022-08-13 MED ORDER — SPIRONOLACTONE 12.5 MG HALF TABLET
12.5000 mg | ORAL_TABLET | Freq: Every day | ORAL | Status: DC
  Administered 2022-08-13 – 2022-08-16 (×4): 12.5 mg via ORAL
  Filled 2022-08-13 (×4): qty 1

## 2022-08-13 MED ORDER — ENOXAPARIN SODIUM 60 MG/0.6ML IJ SOSY
50.0000 mg | PREFILLED_SYRINGE | INTRAMUSCULAR | Status: DC
  Administered 2022-08-13: 50 mg via SUBCUTANEOUS
  Filled 2022-08-13: qty 0.6

## 2022-08-13 NOTE — Assessment & Plan Note (Addendum)
Systolic blood pressure has improved, continue with spironolactone and losartan.

## 2022-08-13 NOTE — Hospital Course (Addendum)
Claudia [name] was admitted to the hospital with the working diagnosis of decompensated heart failure.   56 yo female with the past medical history of hypertension, heart failure, T2DM and obesity who presented with dyspnea. Reported several weeks of worsening dyspnea on exertion, to the point of having symptoms with minimal efforts, associated with orthopnea and intermittent lower extremity edema. On her initial physical examination she was  hypoxic to 84% on room air, blood pressure 148/84, HR 87, RR 14. Lungs with bilateral rales and decreased breath sounds, heart with S1 and S2 present and rhythmic, abdomen with no distention, positive bilateral lower extremity edema.   Na 135, K 4,0 CL 92 bicarbonate 33 glucose 178 bun 17 cr 1,48  High sensitive troponin 30 and 18  Wbc 7.2 hgb 13.5 plt 453   Chest radiograph with cardiomegaly, bilateral hilar vascular congestion, cephalization of the vasculature and fluid in the right fissure.   EKG 100 bpm, right axis deviation, qtc 505, sinus rhythm with left atrial enlargement, no significant ST segment changes, negative T wave lead V1 to V6.   Patient was placed on furosemide for diuresis.   11/19 her volume status continue to improve, but she has been having episodes of hypoxemia, specially at night with sleeping.  11/20 positive 02 desaturation on ambulation. Discharge on supplemental home 02, she will need sleep study as outpatient.

## 2022-08-13 NOTE — Assessment & Plan Note (Addendum)
Uncontrolled diabetes mellitus with high Hgb A1c  Fasting glucose is 143 mg/dl  Continue insulin sliding scale for glucose cover and monitoring Basal insulin 5 units.   Patient had muscle aches with crestor in the past.

## 2022-08-13 NOTE — Progress Notes (Addendum)
Progress Note   Patient: Claudia [name] GBT:[id number] DOB: 13-Nov-1965 DOA: 08/11/2022     0 DOS: the patient was seen and examined on 08/13/2022   Brief hospital course: Mrs. Claudia [name] was admitted to the hospital with the working diagnosis of decompensated heart failure.   56 yo female with the past medical history of hypertension, heart failure, T2DM and obesity who presented with dyspnea. Reported several weeks of worsening dyspnea on exertion, to the point of having symptoms with minimal efforts, associated with orthopnea and intermittent lower extremity edema. On her initial physical examination she was  hypoxic to 84% on room air, blood pressure 148/84, HR 87, RR 14. Lungs with bilateral rales and decreased breath sounds, heart with S1 and S2 present and rhythmic, abdomen with no distention, positive bilateral lower extremity edema.   Na 135, K 4,0 CL 92 bicarbonate 33 glucose 178 bun 17 cr 1,48  High sensitive troponin 30 and 18  Wbc 7.2 hgb 13.5 plt 453   Chest radiograph with cardiomegaly, bilateral hilar vascular congestion, cephalization of the vasculature and fluid in the right fissure.   EKG 100 bpm, right axis deviation, qtc 505, sinus rhythm with left atrial enlargement, no significant ST segment changes, negative T wave lead V1 to V6.   Patient was placed on furosemide for diuresis.     Assessment and Plan: * Acute on chronic diastolic CHF (congestive heart failure) (HCC) Last echocardiogram from 2019 with preserved LV systolic function. 55 to 60%  Continue to have signs of volume overload.  Urine output is 7,106 Systolic blood pressure 269 to 130 mmHg.  Plan to continue diuresis with furosemide, increase dose to 60 mg IV q12 hrs Add SGLT 2 ing and spironolactone.  Discontinue hydralazine and add low dose losartan.  Follow up on echocardiogram.   Acute hypoxemic respiratory failure due to acute cardiogenic pulmonary edema, continue aggressive diuresis Continue  oxymetry monitoring and supplemental 02 per [place] to keep 02 saturation 92% or greater.    HTN (hypertension) Continue blood pressure control with losartan and diuresis with SGLT 2 ing, spironolactone and furosemide Discontinue hydralazine and amlodipine  Close follow up on blood pressure.   Type 2 diabetes mellitus with hyperlipidemia (HCC) Fasting glucose is 152 mg/dl Continue insulin sliding scale for glucose cover and monitoring Basal insulin 5 units.   Patient had muscle aches with crestor in the past.    Class 2 obesity Calculated BMI is 35.5        Subjective: patient continue to have dyspnea and edema lower extremities, no chest pain.   Physical Exam: Vitals:   08/13/22 0000 08/13/22 0555 08/13/22 0851 08/13/22 0900  BP: 116/70 130/75 139/70 114/64  Pulse: 93  64 94  Resp: '18 20 20 18  '$ Temp: 98.5 F (36.9 C) 97.7 F (36.5 C) 98.7 F (37.1 C) 97.8 F (36.6 C)  TempSrc: Oral Oral Oral Oral  SpO2: 95% 94% 97% 97%  Weight:  106.1 kg    Height:       Neurology mild somnolent but easy to arouse ENT with no pallor Cardiovascular with S1 and S2 present and rhythmic with no gallops, rubs or murmurs Respiratory with mild rales with no wheezing Abdomen with no distention Positive lower extremity edema  Data Reviewed:    Family Communication: no family at the bedside   Disposition: Status is: Observation The patient will require care spanning > 2 midnights and should be moved to inpatient because: IV diuretic therapy   Planned Discharge Destination:  Home      Author: Tawni Millers, MD 08/13/2022 10:07 AM  For on call review [email or web address].

## 2022-08-13 NOTE — Assessment & Plan Note (Signed)
Calculated BMI is 35.5  

## 2022-08-13 NOTE — Progress Notes (Signed)
Patient had wide complex tachycardia, personally reviewed telemetry. Qrs complexes with different amplitude.   Her Mag this am was 2,1  Will add 4 mg IV mag sulfate and continue telemetry monitoring  Check Mg in am.

## 2022-08-13 NOTE — Progress Notes (Addendum)
PT stating she feels dizzy. Initially when oob to commode, now even when in bed. Elevated blood sugar treated as ordered; BP, Pulse, and SPO2 obtained.  PT encouraged to reposition. Has mild tremor of right hand related to prior surgery on her back.  States she is feeling it in both hands at this time. Encouraged to reposition. Dr. Cathlean Sauer notified and notification acknowledged.  This RN notified by central tele monitoring that PT had episode of Vfib/Vtach. Entered room, monitor showing NSR. Asked PT how she felt and she stated, "I was just going to call you.  My eyes weren't working well." When asked to describe situation, PT stated she felt like the room was going dark.  She also stated she had to get oob to pee using the bedside commode. Educated PT that it wasn't a good idea for her to get oob at this time and provided a purewick for voiding. Dr. Cathlean Sauer notified. Indicated to continue to monitor. Rapid called. Updated Rapid RN via phone. EKG done, nothing remarkable noted, symptoms improved, PT stated she was still dizzy, but not getting the darkening sensation she had earlier. VSS. Dr. Cathlean Sauer reviewed tele printout of rhythm, ordered dose of Mag Sulfate IV. Reviewed s/s of heart attack and educated PT that if anything changed, to notify RN immediately.   No repeat of arrhythmia noted since event.

## 2022-08-13 NOTE — Assessment & Plan Note (Addendum)
Echocardiogram with preserved LV systolic function with EF 55 to 60%, moderate reduction in RV systolic function, mild enlarged RV cavity, no significant valvular disease.   Urine output is 789 cc  Systolic blood pressure 784 to 160 mmHg.  Continue with SGLT 2 inh and spironolactone, increase dose to 25 mg.  Low dose of losartan, if renal function stable, plan to increase to 50 mg and possible transition to entresto as outpatient.  Plan to use furosemide as outpatient as needed.   Acute hypoxemic respiratory failure due to acute cardiogenic pulmonary edema. Her volume status has improved.   Continue oxymetry monitoring and supplemental 02 per [place] to keep 02 saturation 92% or greater.  Check ambulatory oxymetry, if negative will check a overnight oxymetry, she may have sleep apnea, and will need a formal sleep study as outpatient.

## 2022-08-13 NOTE — Progress Notes (Incomplete)
Echocardiogram 2D Echocardiogram has been performed.  Claudia [name] 08/13/2022, 11:38 AM

## 2022-08-14 ENCOUNTER — Other Ambulatory Visit (HOSPITAL_COMMUNITY): Payer: Self-pay

## 2022-08-14 ENCOUNTER — Encounter (HOSPITAL_COMMUNITY): Payer: Self-pay | Admitting: Internal Medicine

## 2022-08-14 DIAGNOSIS — I5033 Acute on chronic diastolic (congestive) heart failure: Secondary | ICD-10-CM | POA: Diagnosis not present

## 2022-08-14 DIAGNOSIS — I10 Essential (primary) hypertension: Secondary | ICD-10-CM | POA: Diagnosis not present

## 2022-08-14 DIAGNOSIS — E1169 Type 2 diabetes mellitus with other specified complication: Secondary | ICD-10-CM | POA: Diagnosis not present

## 2022-08-14 DIAGNOSIS — E669 Obesity, unspecified: Secondary | ICD-10-CM | POA: Diagnosis not present

## 2022-08-14 LAB — GLUCOSE, CAPILLARY
Glucose-Capillary: 188 mg/dL — ABNORMAL HIGH (ref 70–99)
Glucose-Capillary: 238 mg/dL — ABNORMAL HIGH (ref 70–99)
Glucose-Capillary: 151 mg/dL — ABNORMAL HIGH (ref 70–99)
Glucose-Capillary: 170 mg/dL — ABNORMAL HIGH (ref 70–99)

## 2022-08-14 LAB — BASIC METABOLIC PANEL
Anion gap: 12 (ref 5–15)
BUN: 18 mg/dL (ref 6–20)
CO2: 38 mmol/L — ABNORMAL HIGH (ref 22–32)
Calcium: 9.1 mg/dL (ref 8.9–10.3)
Chloride: 88 mmol/L — ABNORMAL LOW (ref 98–111)
Creatinine, Ser: 1.03 mg/dL — ABNORMAL HIGH (ref 0.44–1.00)
GFR, Estimated: >60 mL/min (ref >60)
Glucose, Bld: 131 mg/dL — ABNORMAL HIGH (ref 70–99)
Potassium: 3.9 mmol/L (ref 3.5–5.1)
Sodium: 138 mmol/L (ref 135–145)

## 2022-08-14 LAB — MAGNESIUM: Magnesium: 2.6 mg/dL — ABNORMAL HIGH (ref 1.7–2.4)

## 2022-08-14 MED ORDER — ENOXAPARIN SODIUM 60 MG/0.6ML IJ SOSY
60.0000 mg | PREFILLED_SYRINGE | INTRAMUSCULAR | Status: DC
  Administered 2022-08-14 – 2022-08-16 (×3): 60 mg via SUBCUTANEOUS
  Filled 2022-08-14 (×3): qty 0.6

## 2022-08-14 NOTE — Progress Notes (Addendum)
Inpatient Diabetes Program Recommendations  AACE/ADA: New Consensus Statement on Inpatient Glycemic Control (2015)  Target Ranges:  Prepandial:   less than 140 mg/dL      Peak postprandial:   less than 180 mg/dL (1-2 hours)      Critically ill patients:  140 - 180 mg/dL   Lab Results  Component Value Date   GLUCAP 238 (H) 08/14/2022   HGBA1C 9.3 (H) 08/12/2022    Review of Glycemic Control  Latest Reference Range & Units 08/13/22 06:13 08/13/22 11:19 08/13/22 16:47 08/13/22 21:05 08/14/22 06:06 08/14/22 11:12  Glucose-Capillary 70 - 99 mg/dL 169 (H) 217 (H) 109 (H) 267 (H) 188 (H) 238 (H)   Diabetes history: DM 2 Outpatient Diabetes medications:  Glucotrol 10 mg bid, Metformin 1000 mg bid Current orders for Inpatient glycemic control:  Novolog 0-15 units tid with meals and HS Semglee 5 units daily Jardiance 10 mg daily  Inpatient Diabetes Program Recommendations:    Consider increasing Semglee 15 units daily while in the hospital.   Thanks,  Adah Perl, RN, BC-ADM Inpatient Diabetes Coordinator Pager [phone]  (8a-5p)

## 2022-08-14 NOTE — Inpatient Diabetes Management (Signed)
Spoke with patient at the bedside. States that she has had diabetes for a very long time. Has been on Glucotrol and Metformin for many years. States that she has a PCP that she sees for glucose control. She gets her glucose meter strips from the [facility]. States that she does check blood sugars every day. States that her HgbA1C of 9.3% has been lower in the past, but it varies. She is aware that it needs to come down closer to 7%.   Will continue to monitor blood sugars while in the hospital.  Harvel Ricks RN BSN CDE Diabetes Coordinator Pager: [phone]  8am-5pm

## 2022-08-14 NOTE — Progress Notes (Signed)
Heart Failure Nurse Navigator Progress Note  PCP: Fenton Foy, NP PCP-Cardiologist: Marlou Porch Admission Diagnosis: Acute respiratory failure with hypoxia Admitted from: Home via EMS  Presentation:   Claudia [name] presented with chest pain, shortness of breath and dyspnea on exertion,sleeps with 3 pillows  over the last 2 weeks. Placed on 5 L via EMS. BP 148/84, HR 87, BNP 138. Trace lower extremity edema. BMI 42.33.   Patient was educated on the sign and symptoms of heart failure, importance of daily weights, Diet/ fluid restrictions ( reported use of salt on most foods and drinking over 64 oz of fluid a day. Patient was educated on the importance of taking all medications as prescribed and attending all medical appointments. Patient verbalized her understanding of the education , a hospital HF TOC appointment was made for 08/26/22 @ 3 pm.   ECHO/ LVEF: 55-60% HFimpEF  Clinical Course:  Past Medical History:  Diagnosis Date   Diabetes mellitus    Fibroids    Headache(784.0)    History of echocardiogram    Echo 7/18: Moderate concentric LVH, EF 55-60, normal wall motion, grade 1 diastolic dysfunction, calcified aortic leaflets   Hypercholesterolemia    Hyperlipidemia    Hypertension    Osteoarthritis    , with mild meniscus tear, followed by orthopedics--Dr Gladstone Lighter 04/29/10   Right knee pain    Seizures ([place])    as child   STEMI (ST elevation myocardial infarction) ([place])    Vitamin D deficiency      Social History   Socioeconomic History   Marital status: Married    Spouse name: Doren Custard   Number of children: 6   Years of education: Not on file   Highest education level: High school graduate  Occupational History   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   Smoking status: Never   Smokeless tobacco: Never  Vaping Use   Vaping Use: Never used  Substance and Sexual Activity   Alcohol use: No   Drug use: No   Sexual activity: Yes  Other Topics Concern    Not on file  Social History Narrative   Not on file   Social Determinants of Health   Financial Resource Strain: Medium Risk (08/14/2022)   Overall Financial Resource Strain (CARDIA)    Difficulty of Paying Living Expenses: Somewhat hard  Food Insecurity: Unknown (08/14/2022)   Hunger Vital Sign    Worried About Running Out of Food in the Last Year: Not on file    Ran Out of Food in the Last Year: Never true  Transportation Needs: No Transportation Needs (08/14/2022)   PRAPARE - Hydrologist (Medical): No    Lack of Transportation (Non-Medical): No  Physical Activity: Not on file  Stress: Not on file  Social Connections: Not on file   Education Assessment and Provision:  Detailed education and instructions provided on heart failure disease management including the following:  Signs and symptoms of Heart Failure When to call the physician Importance of daily weights Low sodium diet Fluid restriction Medication management Anticipated future follow-up appointments  Patient education given on each of the above topics.  Patient acknowledges understanding via teach back method and acceptance of all instructions.  Education Materials:  "Living Better With Heart Failure" Booklet, HF zone tool, & Daily Weight Tracker Tool.  Patient has scale at home: scale Patient has pill box at home: NA    High Risk Criteria for Readmission and/or Poor Patient Outcomes:  Heart failure hospital admissions (last 6 months): 0  No Show rate: 5 % Difficult social situation: No Demonstrates medication adherence: Yes Primary Language: English Literacy level: Reading, writing, and comprehension.  Barriers of Care:   Diet/ fluid ( salt/ soda) Weights   Considerations/Referrals:   Referral made to Heart Failure Pharmacist Stewardship: Yes Referral made to Heart Failure CSW/NCM TOC: No Referral made to Heart & Vascular TOC clinic: Yes, 08/26/2022 @ 3 pm.   Items for  Follow-up on DC/TOC: Diet/ fluid ( salt/ soda) Daily weights   Earnestine Leys, BSN, RN Heart Failure Transport planner Only

## 2022-08-14 NOTE — Progress Notes (Signed)
Nurse tech attempted to get patient up for standing weight, but patient reports she feels too shaky to get up. Pt. Reports this has been an ongoing issue, especially since hospitalization. Bed weight was obtained at this time instead.

## 2022-08-14 NOTE — Progress Notes (Signed)
Dr. Bridgett Larsson paged to notify that Pt. desatting in her sleep as low as the 50's on Venti mask. Order received for hospital CPAP. Respiratory therapist made aware.

## 2022-08-14 NOTE — Progress Notes (Signed)
   Heart Failure Stewardship Pharmacist Progress Note   PCP: Fenton Foy, NP PCP-Cardiologist: Candee Furbish, MD    HPI:  56 y.o AA female with PMH significant for HTN, HLD, T2DM, SVT, and HFimpEF. She presented to the ED on 08/11/2022 with reports SOB, orthopnea requiring 3 pillows, and dyspnea on exertion over the past 2 weeks. Additionally, she reported chest pain that started the day prior and chest tightness that has since resolved. She was hypoxic on RA and was initially placed supplemental oxygen.   Initial ECHO in 2013 showed EF 40-45% in the setting of HTN urgency. Most recent heart catheterization 03/2018 showed normal coronary arteries. ECHO on 08/13/2022 demonstrated EF 55-60% with mild LVH with moderately reduced RV function.   Current HF Medications: Diuretic: furosemide '60mg'$  BID  ACE/ARB/ARNI: losartan '25mg'$  once daily MRA: spironolactone 12.'5mg'$  once daily SGLT2i: Jardiance '10mg'$  once daily  Prior to admission HF Medications: ACE/ARB/ARNI: losartan '100mg'$  1/2 tablet once daily MRA: spironolactone '25mg'$  once daily  Pertinent Lab Values: Serum creatinine 1.03, BUN 18, Potassium 3.9, Sodium 138, BNP 138.5, Magnesium 2.6, A1c 9.3  Vital Signs: Weight: 282 lbs (admission weight: 285 lbs) Blood pressure: 140-110s/60-80s  Heart rate: 80-110s  I/O: -2.35L yesterday; net -5.4L  Medication Assistance / Insurance Benefits Check: Does the patient have prescription insurance?  Yes Type of insurance plan: [employer]  Does the patient qualify for medication assistance through manufacturers or grants?   Pending Eligible grants and/or patient assistance programs: pending Medication assistance applications in progress: none  Medication assistance applications approved: none Approved medication assistance renewals will be completed by: pending  Outpatient Pharmacy:  Prior to admission outpatient pharmacy: [facility]  Is the patient willing to use [facility] at discharge? Yes Is  the patient willing to transition their outpatient pharmacy to utilize a [facility] outpatient pharmacy?   Yes    Assessment: 1. Acute on chronic diastolic CHF (LVEF 40-98%), due to NICM. NYHA class III-IV symptoms. - Will need to switch from [place] to [place] for insurance coverage. -Would benefit from [facility] therapy in place of her current RAAS therapy. -Beta blocker before discharge once euvolemic  -Continue current diuretic therapy. May need a dose reduction pending volume status.  -Keep K>4 and Mg>2. Strict I/Os. Daily standing weights -Continue current diuretic therapy    Plan: 1) Medication changes recommended at this time: -Initiate Entresto 24/'26mg'$  BID starting tomorrow as she has had her losartan dose today -Discontinue Jardiance '10mg'$  once daily  -Initiate Farxiga '10mg'$  once daily tomorrow  2) Patient assistance: -Vania Rea is not on formulary. Will need to switch to [place]. Copay is $15 -Entresto copay [phone]- needs copay card  3)  Education  - Patient has been educated on current HF medications and potential additions to HF medication regimen - Patient verbalizes understanding that over the next few months, these medication doses may change and more medications may be added to optimize HF regimen - Patient has been educated on basic disease state pathophysiology and goals of therapy  Maryan Puls, PharmD PGY-1 [facility] Resident

## 2022-08-14 NOTE — Progress Notes (Addendum)
Progress Note   Patient: Claudia [name] UVO:[id number] DOB: June 10, 1966 DOA: 08/11/2022     1 DOS: the patient was seen and examined on 08/14/2022   Brief hospital course: Claudia [name] was admitted to the hospital with the working diagnosis of decompensated heart failure.   56 yo female with the past medical history of hypertension, heart failure, T2DM and obesity who presented with dyspnea. Reported several weeks of worsening dyspnea on exertion, to the point of having symptoms with minimal efforts, associated with orthopnea and intermittent lower extremity edema. On her initial physical examination she was  hypoxic to 84% on room air, blood pressure 148/84, HR 87, RR 14. Lungs with bilateral rales and decreased breath sounds, heart with S1 and S2 present and rhythmic, abdomen with no distention, positive bilateral lower extremity edema.   Na 135, K 4,0 CL 92 bicarbonate 33 glucose 178 bun 17 cr 1,48  High sensitive troponin 30 and 18  Wbc 7.2 hgb 13.5 plt 453   Chest radiograph with cardiomegaly, bilateral hilar vascular congestion, cephalization of the vasculature and fluid in the right fissure.   EKG 100 bpm, right axis deviation, qtc 505, sinus rhythm with left atrial enlargement, no significant ST segment changes, negative T wave lead V1 to V6.   Patient was placed on furosemide for diuresis.     Assessment and Plan: * Acute on chronic diastolic CHF (congestive heart failure) (HCC) Echocardiogram with preserved LV systolic function with EF 55 to 60%, moderate reduction in RV systolic function, mild enlarged RV cavity, no significant valvular disease.   Urine output is 7,425 cc  Systolic blood pressure 956 to 117 mmHg.  Patient had furosemide IV this am 60 mg, she is feeling orthostatic and weak. Plan to hold on pm dose of furosemide for today, Continue with SGLT 2 inh and spironolactone.  Low dose of losartan.   Acute hypoxemic respiratory failure due to acute cardiogenic  pulmonary edema, continue aggressive diuresis Continue oxymetry monitoring and supplemental 02 per [place] to keep 02 saturation 92% or greater.  Wean to room air as tolerated.    HTN (hypertension) Systolic blood pressure has improved, continue with spironolactone and losartan.   Type 2 diabetes mellitus with hyperlipidemia (HCC) Uncontrolled diabetes mellitus with high Hgb A1c  Fasting glucose is 131 mg/dl Capillary glucose has been 267, 188 and 238  Continue insulin sliding scale for glucose cover and monitoring Basal insulin 5 units.   Patient had muscle aches with crestor in the past.    Class 2 obesity Calculated BMI is 35.5        Subjective: Patient with dizziness and weakness, not able to move much, no chest pain, dyspnea and edema have improved.,   Physical Exam: Vitals:   08/14/22 0742 08/14/22 0900 08/14/22 0909 08/14/22 1116  BP: 125/66   117/86  Pulse: 94 95 98 95  Resp: 20   18  Temp:    98.8 F (37.1 C)  TempSrc:    Oral  SpO2: 93%  98% 95%  Weight:      Height:       Neurology awake and alert ENT with no pallor Cardiovascular with S1 and S2 present and rhythmic with no gallops, rubs or murmurs No JVD No lower extremity edema Respiratory with no rales or wheezing Abdomen with no distention  Data Reviewed:    Family Communication: I spoke with patient's mother and sister at the bedside, we talked in detail about patient's condition, plan of care and prognosis  and all questions were addressed.    Disposition: Status is: Inpatient Remains inpatient appropriate because: heart failure   Planned Discharge Destination: Home  Author: Tawni Millers, MD 08/14/2022 3:54 PM  For on call review [email or web address].

## 2022-08-14 NOTE — Progress Notes (Signed)
Patient refused CPAP. Unit still at bedside if needed or she changes her mind. Patient currently on 3.5 LPM nasal cannula.

## 2022-08-15 ENCOUNTER — Inpatient Hospital Stay (HOSPITAL_COMMUNITY): Payer: Commercial Managed Care - HMO

## 2022-08-15 ENCOUNTER — Other Ambulatory Visit: Payer: Self-pay | Admitting: Nurse Practitioner

## 2022-08-15 DIAGNOSIS — I10 Essential (primary) hypertension: Secondary | ICD-10-CM

## 2022-08-15 DIAGNOSIS — E669 Obesity, unspecified: Secondary | ICD-10-CM | POA: Diagnosis not present

## 2022-08-15 DIAGNOSIS — I5033 Acute on chronic diastolic (congestive) heart failure: Secondary | ICD-10-CM | POA: Diagnosis not present

## 2022-08-15 DIAGNOSIS — E876 Hypokalemia: Secondary | ICD-10-CM

## 2022-08-15 LAB — BASIC METABOLIC PANEL
Anion gap: 10 (ref 5–15)
BUN: 14 mg/dL (ref 6–20)
CO2: 42 mmol/L — ABNORMAL HIGH (ref 22–32)
Calcium: 9.2 mg/dL (ref 8.9–10.3)
Chloride: 87 mmol/L — ABNORMAL LOW (ref 98–111)
Creatinine, Ser: 0.9 mg/dL (ref 0.44–1.00)
GFR, Estimated: >60 mL/min (ref >60)
Glucose, Bld: 143 mg/dL — ABNORMAL HIGH (ref 70–99)
Potassium: 3.3 mmol/L — ABNORMAL LOW (ref 3.5–5.1)
Sodium: 139 mmol/L (ref 135–145)

## 2022-08-15 LAB — GLUCOSE, CAPILLARY
Glucose-Capillary: 133 mg/dL — ABNORMAL HIGH (ref 70–99)
Glucose-Capillary: 214 mg/dL — ABNORMAL HIGH (ref 70–99)
Glucose-Capillary: 227 mg/dL — ABNORMAL HIGH (ref 70–99)
Glucose-Capillary: 177 mg/dL — ABNORMAL HIGH (ref 70–99)

## 2022-08-15 LAB — MAGNESIUM: Magnesium: 2.1 mg/dL (ref 1.7–2.4)

## 2022-08-15 MED ORDER — MORPHINE SULFATE (PF) 2 MG/ML IV SOLN: 2.0000 mg | INTRAVENOUS | Status: DC | PRN

## 2022-08-15 MED ORDER — POTASSIUM CHLORIDE CRYS ER 20 MEQ PO TBCR
40.0000 meq | EXTENDED_RELEASE_TABLET | Freq: Once | ORAL | Status: AC
  Administered 2022-08-15: 40 meq via ORAL
  Filled 2022-08-15: qty 2

## 2022-08-15 MED ORDER — POTASSIUM CHLORIDE CRYS ER 20 MEQ PO TBCR
40.0000 meq | EXTENDED_RELEASE_TABLET | Freq: Once | ORAL | Status: AC
  Administered 2022-08-15: 40 meq via ORAL

## 2022-08-15 MED ORDER — SALINE SPRAY 0.65 % NA SOLN
1.0000 | NASAL | Status: DC | PRN
  Administered 2022-08-15: 1 via NASAL
  Filled 2022-08-15: qty 44

## 2022-08-15 NOTE — Assessment & Plan Note (Signed)
Renal function stable, volume status has improved  Serum cr is 0,90 with K at 3,3 and serum bicarbonate at 42. Plan to add KCl 40 meq x2 and follow up renal function in am,

## 2022-08-15 NOTE — Progress Notes (Signed)
Progress Note   Patient: Claudia [name] PNT:[id number] DOB: Sep 24, 1966 DOA: 08/11/2022     2 DOS: the patient was seen and examined on 08/15/2022   Brief hospital course: [name] was admitted to the hospital with the working diagnosis of decompensated heart failure.   57 yo female with the past medical history of hypertension, heart failure, T2DM and obesity who presented with dyspnea. Reported several weeks of worsening dyspnea on exertion, to the point of having symptoms with minimal efforts, associated with orthopnea and intermittent lower extremity edema. On her initial physical examination she was  hypoxic to 84% on room air, blood pressure 148/84, HR 87, RR 14. Lungs with bilateral rales and decreased breath sounds, heart with S1 and S2 present and rhythmic, abdomen with no distention, positive bilateral lower extremity edema.   Na 135, K 4,0 CL 92 bicarbonate 33 glucose 178 bun 17 cr 1,48  High sensitive troponin 30 and 18  Wbc 7.2 hgb 13.5 plt 453   Chest radiograph with cardiomegaly, bilateral hilar vascular congestion, cephalization of the vasculature and fluid in the right fissure.   EKG 100 bpm, right axis deviation, qtc 505, sinus rhythm with left atrial enlargement, no significant ST segment changes, negative T wave lead V1 to V6.   Patient was placed on furosemide for diuresis.     Assessment and Plan: * Acute on chronic diastolic CHF (congestive heart failure) (HCC) Echocardiogram with preserved LV systolic function with EF 55 to 60%, moderate reduction in RV systolic function, mild enlarged RV cavity, no significant valvular disease.   Urine output is 0,867 cc  Systolic blood pressure 619 to 142 mmHg.  Continue with SGLT 2 inh and spironolactone.  Low dose of losartan. Resume loop diuretic in am.    Acute hypoxemic respiratory failure due to acute cardiogenic pulmonary edema, continue aggressive diuresis Continue oxymetry monitoring and supplemental 02 per [place]  to keep 02 saturation 92% or greater.  Wean to room air as tolerated.  Follow up chest radiograph with improvement in pulmonary edema, personally reviewed.    HTN (hypertension) Systolic blood pressure has improved, continue with spironolactone and losartan.   Type 2 diabetes mellitus with hyperlipidemia (HCC) Uncontrolled diabetes mellitus with high Hgb A1c  Fasting glucose is 143 mg/dl  Continue insulin sliding scale for glucose cover and monitoring Basal insulin 5 units.   Patient had muscle aches with crestor in the past.    Class 2 obesity Calculated BMI is 35.5  Hypokalemia Renal function stable, volume status has improved  Serum cr is 0,90 with K at 3,3 and serum bicarbonate at 42. Plan to add KCl 40 meq x2 and follow up renal function in am,         Subjective: Patient with improvement in her dizziness and weakness, dyspnea and lower extremity edema continue to improve   Physical Exam: Vitals:   08/15/22 0528 08/15/22 0532 08/15/22 0705 08/15/22 0916  BP: (!) 142/89  (!) 166/93 137/75  Pulse: 91 87 (!) 112 87  Resp: 18   20  Temp: 97.7 F (36.5 C)  99 F (37.2 C) (!) 97.5 F (36.4 C)  TempSrc: Oral  Oral Oral  SpO2:  96%  93%  Weight:      Height:       Neurology awake and alert  ENT with no pallor Cardiovascular with S1 and S2 present and rhythmic with no gallops Respiratory with no rales or wheezing Abdomen with no distention  No lower extremity edema  Data Reviewed:    Family Communication: I spoke with patient's husband at the bedside, we talked in detail about patient's condition, plan of care and prognosis and all questions were addressed.   Disposition: Status is: Inpatient Remains inpatient appropriate because: close volume monitoring, possible dc in 24 hrs   Planned Discharge Destination: Home    Author: Tawni Millers, MD 08/15/2022 1:18 PM  For on call review [email or web address].

## 2022-08-16 DIAGNOSIS — I5033 Acute on chronic diastolic (congestive) heart failure: Secondary | ICD-10-CM | POA: Diagnosis not present

## 2022-08-16 DIAGNOSIS — I10 Essential (primary) hypertension: Secondary | ICD-10-CM | POA: Diagnosis not present

## 2022-08-16 DIAGNOSIS — E1169 Type 2 diabetes mellitus with other specified complication: Secondary | ICD-10-CM | POA: Diagnosis not present

## 2022-08-16 DIAGNOSIS — E669 Obesity, unspecified: Secondary | ICD-10-CM | POA: Diagnosis not present

## 2022-08-16 LAB — GLUCOSE, CAPILLARY
Glucose-Capillary: 155 mg/dL — ABNORMAL HIGH (ref 70–99)
Glucose-Capillary: 163 mg/dL — ABNORMAL HIGH (ref 70–99)
Glucose-Capillary: 216 mg/dL — ABNORMAL HIGH (ref 70–99)
Glucose-Capillary: 155 mg/dL — ABNORMAL HIGH (ref 70–99)

## 2022-08-16 LAB — BASIC METABOLIC PANEL
Anion gap: 9 (ref 5–15)
BUN: 13 mg/dL (ref 6–20)
CO2: 40 mmol/L — ABNORMAL HIGH (ref 22–32)
Calcium: 9 mg/dL (ref 8.9–10.3)
Chloride: 89 mmol/L — ABNORMAL LOW (ref 98–111)
Creatinine, Ser: 0.86 mg/dL (ref 0.44–1.00)
GFR, Estimated: >60 mL/min (ref >60)
Glucose, Bld: 102 mg/dL — ABNORMAL HIGH (ref 70–99)
Potassium: 3.7 mmol/L (ref 3.5–5.1)
Sodium: 138 mmol/L (ref 135–145)

## 2022-08-16 MED ORDER — SPIRONOLACTONE 25 MG PO TABS
25.0000 mg | ORAL_TABLET | Freq: Every day | ORAL | Status: DC
  Administered 2022-08-17: 25 mg via ORAL
  Filled 2022-08-16: qty 1

## 2022-08-16 MED ORDER — SPIRONOLACTONE 12.5 MG HALF TABLET
12.5000 mg | ORAL_TABLET | Freq: Once | ORAL | Status: AC
  Administered 2022-08-16: 12.5 mg via ORAL

## 2022-08-16 MED ORDER — INFLUENZA VAC SPLIT QUAD 0.5 ML IM SUSY
0.5000 mL | PREFILLED_SYRINGE | INTRAMUSCULAR | Status: DC
  Filled 2022-08-16 (×2): qty 0.5

## 2022-08-16 NOTE — Progress Notes (Signed)
Placed pt. On overnight pulse ox study pt. Is on 2L N/C RN aware.

## 2022-08-16 NOTE — Progress Notes (Signed)
Progress Note   Patient: Claudia [name] PVV:[id number] DOB: Mar 28, 1966 DOA: 08/11/2022     3 DOS: the patient was seen and examined on 08/16/2022   Brief hospital course: Claudia [name] was admitted to the hospital with the working diagnosis of decompensated heart failure.   56 yo female with the past medical history of hypertension, heart failure, T2DM and obesity who presented with dyspnea. Reported several weeks of worsening dyspnea on exertion, to the point of having symptoms with minimal efforts, associated with orthopnea and intermittent lower extremity edema. On her initial physical examination she was  hypoxic to 84% on room air, blood pressure 148/84, HR 87, RR 14. Lungs with bilateral rales and decreased breath sounds, heart with S1 and S2 present and rhythmic, abdomen with no distention, positive bilateral lower extremity edema.   Na 135, K 4,0 CL 92 bicarbonate 33 glucose 178 bun 17 cr 1,48  High sensitive troponin 30 and 18  Wbc 7.2 hgb 13.5 plt 453   Chest radiograph with cardiomegaly, bilateral hilar vascular congestion, cephalization of the vasculature and fluid in the right fissure.   EKG 100 bpm, right axis deviation, qtc 505, sinus rhythm with left atrial enlargement, no significant ST segment changes, negative T wave lead V1 to V6.   Patient was placed on furosemide for diuresis.   11/19 her volume status continue to improve, but she has been having episodes of hypoxemia, specially at night with sleeping.   Assessment and Plan: * Acute on chronic diastolic CHF (congestive heart failure) (HCC) Echocardiogram with preserved LV systolic function with EF 55 to 60%, moderate reduction in RV systolic function, mild enlarged RV cavity, no significant valvular disease.   Urine output is 754 cc  Systolic blood pressure 492 to 160 mmHg.  Continue with SGLT 2 inh and spironolactone, increase dose to 25 mg.  Low dose of losartan, if renal function stable, plan to increase to 50 mg  and possible transition to entresto as outpatient.  Plan to use furosemide as outpatient as needed.   Acute hypoxemic respiratory failure due to acute cardiogenic pulmonary edema. Her volume status has improved.   Continue oxymetry monitoring and supplemental 02 per [place] to keep 02 saturation 92% or greater.  Check ambulatory oxymetry, if negative will check a overnight oxymetry, she may have sleep apnea, and will need a formal sleep study as outpatient.   HTN (hypertension) Increase dose of spironolactone and continue with losartan. Possible transition to entresto as outpatient.   Type 2 diabetes mellitus with hyperlipidemia (HCC) Uncontrolled diabetes mellitus with high Hgb A1c   Continue insulin sliding scale for glucose cover and monitoring Basal insulin 5 units.   Patient had muscle aches with crestor in the past.    Class 2 obesity Calculated BMI is 35.5  Hypokalemia Renal function stable, volume status has improved  Electrolytes have been corrected, today serum K is 3,7 and bicarbonate at 40, Continue close follow up renal function and electrolytes.         Subjective: Patient with improvement in dyspnea and balance, but not back to baseline, worse symptoms when seating, 02 desaturation when sleeping    Physical Exam: Vitals:   08/16/22 0226 08/16/22 0352 08/16/22 0517 08/16/22 0727  BP:   (!) 162/87 (!) 139/91  Pulse: (!) 111 90 88 84  Resp:   20 20  Temp:   (!) 97.5 F (36.4 C) 97.7 F (36.5 C)  TempSrc:   Oral Oral  SpO2: 98% 90% 99% 93%  Weight:   125.9 kg   Height:       Neurology awake and alert ENT with no pallor Cardiovascular with S1 and S2 present and rhythmic with no gallops, rubs or murmurs Respiratory with no rales or wheezing, no rhonchi Abdomen with no distention No lower extremity edema  Data Reviewed:    Family Communication: no family at the bedside   Disposition: Status is: Inpatient Remains inpatient appropriate because:  hypoxemia,   Planned Discharge Destination: Home     Author: Tawni Millers, MD 08/16/2022 11:17 AM  For on call review [email or web address].

## 2022-08-16 NOTE — Progress Notes (Signed)
SATURATION QUALIFICATIONS: (This note is used to comply with regulatory documentation for home oxygen)  Patient Saturations on Room Air at Rest = 85%   Patient Saturations on 3 Liters of oxygen while Ambulating = 92%  Please briefly explain why patient needs home oxygen: Pt unable to maintain adequate oxygen saturations on room air.

## 2022-08-17 ENCOUNTER — Other Ambulatory Visit (HOSPITAL_COMMUNITY): Payer: Self-pay

## 2022-08-17 DIAGNOSIS — I5033 Acute on chronic diastolic (congestive) heart failure: Secondary | ICD-10-CM | POA: Diagnosis not present

## 2022-08-17 DIAGNOSIS — E876 Hypokalemia: Secondary | ICD-10-CM | POA: Diagnosis not present

## 2022-08-17 DIAGNOSIS — I10 Essential (primary) hypertension: Secondary | ICD-10-CM | POA: Diagnosis not present

## 2022-08-17 DIAGNOSIS — E669 Obesity, unspecified: Secondary | ICD-10-CM | POA: Diagnosis not present

## 2022-08-17 LAB — GLUCOSE, CAPILLARY
Glucose-Capillary: 125 mg/dL — ABNORMAL HIGH (ref 70–99)
Glucose-Capillary: 178 mg/dL — ABNORMAL HIGH (ref 70–99)

## 2022-08-17 LAB — BASIC METABOLIC PANEL
Anion gap: 11 (ref 5–15)
BUN: 13 mg/dL (ref 6–20)
CO2: 39 mmol/L — ABNORMAL HIGH (ref 22–32)
Calcium: 9.2 mg/dL (ref 8.9–10.3)
Chloride: 91 mmol/L — ABNORMAL LOW (ref 98–111)
Creatinine, Ser: 0.78 mg/dL (ref 0.44–1.00)
GFR, Estimated: >60 mL/min (ref >60)
Glucose, Bld: 118 mg/dL — ABNORMAL HIGH (ref 70–99)
Potassium: 3.5 mmol/L (ref 3.5–5.1)
Sodium: 141 mmol/L (ref 135–145)

## 2022-08-17 MED ORDER — FERROUS SULFATE 325 (65 FE) MG PO TABS: 325.0000 mg | ORAL_TABLET | Freq: Every day | ORAL | Status: DC

## 2022-08-17 MED ORDER — FUROSEMIDE 40 MG PO TABS: 40.0000 mg | ORAL_TABLET | Freq: Every day | ORAL | Status: DC | PRN

## 2022-08-17 MED ORDER — FERROUS SULFATE 325 (65 FE) MG PO TABS: 325.0000 mg | ORAL_TABLET | Freq: Every day | ORAL | 6 refills | Status: DC

## 2022-08-17 MED ORDER — FUROSEMIDE 40 MG PO TABS
40.0000 mg | ORAL_TABLET | Freq: Every day | ORAL | 0 refills | Status: DC | PRN
  Filled 2022-08-17: qty 30, 30d supply, fill #0

## 2022-08-17 MED ORDER — POTASSIUM CHLORIDE CRYS ER 20 MEQ PO TBCR
40.0000 meq | EXTENDED_RELEASE_TABLET | ORAL | Status: DC
  Administered 2022-08-17: 40 meq via ORAL
  Filled 2022-08-17: qty 2

## 2022-08-17 MED ORDER — DAPAGLIFLOZIN PROPANEDIOL 10 MG PO TABS
10.0000 mg | ORAL_TABLET | Freq: Every day | ORAL | 0 refills | Status: DC
  Filled 2022-08-17: qty 30, 30d supply, fill #0

## 2022-08-17 NOTE — Progress Notes (Signed)
   Heart Failure Stewardship Pharmacist Progress Note   PCP: Fenton Foy, NP PCP-Cardiologist: Candee Furbish, MD    HPI:  56 y.o AA female with PMH significant for HTN, HLD, T2DM, SVT, and HFimpEF. She presented to the ED on 08/11/2022 with reports SOB, orthopnea requiring 3 pillows, and dyspnea on exertion over the past 2 weeks. Additionally, she reported chest pain that started the day prior and chest tightness that has since resolved. She was hypoxic on RA and was initially placed supplemental oxygen.   Initial ECHO in 2013 showed EF 40-45% in the setting of HTN urgency. Most recent heart catheterization 03/2018 showed normal coronary arteries. ECHO on 08/13/2022 demonstrated EF 55-60% with mild LVH with moderately reduced RV function.   Discharge HF Medications: Diuretic: furosemide '40mg'$  daily PRN  ACE/ARB/ARNI: losartan '50mg'$  once daily MRA: spironolactone '25mg'$  once daily SGLT2i: Farxiga '10mg'$  once daily Other: hydralazine 25 mg TID  Prior to admission HF Medications: ACE/ARB/ARNI: losartan '100mg'$  1/2 tablet once daily MRA: spironolactone '25mg'$  once daily  Pertinent Lab Values: Serum creatinine 0.78, BUN 13, Potassium 3.5, Sodium 141, BNP 138.5, Magnesium 2.1, A1c 9.3  Vital Signs: Weight: 278 lbs (admission weight: 285 lbs) Blood pressure: 140-150/80s  Heart rate: 80s  I/O: -1.3L yesterday; net -6.8L  Medication Assistance / Insurance Benefits Check: Does the patient have prescription insurance?  Yes Type of insurance plan: [place]   Outpatient Pharmacy:  Prior to admission outpatient pharmacy: [facility]  Is the patient willing to use [place] at discharge? Yes Is the patient willing to transition their outpatient pharmacy to utilize a [facility] outpatient pharmacy?   Yes    Assessment: 1. Acute on chronic diastolic CHF (LVEF 54-09%), due to NICM. NYHA class III-IV symptoms. - Agree with discharging on furosemide 40 mg daily PRN - Continue losartan 50 mg daily,  consider optimizing to Entresto at follow up - Continue spironolactone 25 mg daily and Farxiga 10 mg daily - Hydralazine 25 mg TID and amlodipine 10 mg daily resumed at discharge - recommend titrating off these in order to optimize other GDMT   Plan: 1) Medication changes recommended at this time: - Stop hydralazine and amlodipine (have not been taking this admission)  2) Patient assistance: -Vania Rea is not on formulary, Farxiga preferred [employer] copay $66.53 - will need copay card  3)  Education  - Patient has been educated on current HF medications and potential additions to HF medication regimen - Patient verbalizes understanding that over the next few months, these medication doses may change and more medications may be added to optimize HF regimen - Patient has been educated on basic disease state pathophysiology and goals of therapy  Kerby Nora, PharmD, BCPS Heart Failure Stewardship Pharmacist Phone [phone]

## 2022-08-17 NOTE — TOC Transition Note (Signed)
Transition of Care [facility]) - CM/SW Discharge Note   Patient Details  Name: Claudia [name] MRN: [id number] Date of Birth: 30-May-1966  Transition of Care [facility]) CM/SW Contact:  Zenon Mayo, RN Phone Number: 08/17/2022, 1:29 PM   Clinical Narrative:    Patient is from home with spouse, she needs home oxygen, NCM offered choice,  with Medicare. Gov list, she has no preference, NCM made referral to [place] with [facility] for home oxygen, this will be brought up to the room for patient to go home with.    Final next level of care: Home/Self Care Barriers to Discharge: No Barriers Identified   Patient Goals and CMS Choice Patient states their goals for this hospitalization and ongoing recovery are:: return home with spouse CMS Medicare.gov Compare Post Acute Care list provided to:: Patient Choice offered to / list presented to : Patient  Discharge Placement                       Discharge Plan and Services                DME Arranged: Oxygen DME Agency: [employer] Date DME Agency Contacted: 08/17/22 Time DME Agency Contacted: [id number] Representative spoke with at DME Agency: Brenton Grills HH Arranged: NA          Social Determinants of Health (SDOH) Interventions Food Insecurity Interventions: Intervention Not Indicated Housing Interventions: Intervention Not Indicated Transportation Interventions: Intervention Not Indicated Alcohol Usage Interventions: Intervention Not Indicated (Score <7) Financial Strain Interventions: Intervention Not Indicated   Readmission Risk Interventions     No data to display

## 2022-08-17 NOTE — Discharge Summary (Addendum)
Physician Discharge Summary   Patient: Claudia [name] MRN: [id number] DOB: November 17, 1965  Admit date:     08/11/2022  Discharge date: 08/17/22  Discharge Physician: Jimmy Picket Granite Godman   PCP: Fenton Foy, NP   Recommendations at discharge:    Added daily SGLT 2 inh.  Follow up renal function and electrolytes as outpatient Furosemide as needed for signs of volume overload, weight gain 2 to 3 lbs in 24 hrs or 5 lbs in 7 days, edema at her lower extremities or worsening dyspnea. Continue with supplemental 02 per [place] on ambulation and at night, will need outpatient sleep study. Needs aggressive life style modifications.   Discharge Diagnoses: Principal Problem:   Acute on chronic diastolic CHF (congestive heart failure) (HCC) Active Problems:   HTN (hypertension)   Type 2 diabetes mellitus with hyperlipidemia (HCC)   Class 2 obesity   Hypokalemia  Resolved Problems:   * No resolved hospital problems. [facility] - Inpatient Course: Claudia [name] was admitted to the hospital with the working diagnosis of decompensated heart failure.   56 yo female with the past medical history of hypertension, heart failure, T2DM and obesity who presented with dyspnea. Reported several weeks of worsening dyspnea on exertion, to the point of having symptoms with minimal efforts, associated with orthopnea and intermittent lower extremity edema. On her initial physical examination she was  hypoxic to 84% on room air, blood pressure 148/84, HR 87, RR 14. Lungs with bilateral rales and decreased breath sounds, heart with S1 and S2 present and rhythmic, abdomen with no distention, positive bilateral lower extremity edema.   Na 135, K 4,0 CL 92 bicarbonate 33 glucose 178 bun 17 cr 1,48  High sensitive troponin 30 and 18  Wbc 7.2 hgb 13.5 plt 453   Chest radiograph with cardiomegaly, bilateral hilar vascular congestion, cephalization of the vasculature and fluid in the right fissure.   EKG 100 bpm, right axis  deviation, qtc 505, sinus rhythm with left atrial enlargement, no significant ST segment changes, negative T wave lead V1 to V6.   Patient was placed on furosemide for diuresis.   11/19 her volume status continue to improve, but she has been having episodes of hypoxemia, specially at night with sleeping.  11/20 positive 02 desaturation on ambulation. Discharge on supplemental home 02, she will need sleep study as outpatient.   Assessment and Plan: * Acute on chronic diastolic CHF (congestive heart failure) (HCC) Echocardiogram with preserved LV systolic function with EF 55 to 60%, moderate reduction in RV systolic function, mild enlarged RV cavity, no significant valvular disease.   Patient was placed on IV furosemide for diuresis, negative fluid balance was achieved, -7.133 ml, with significant improvement in her symptoms.   Patient will continue with  SGLT 2 inh and spironolactone.   25 mg losartan and possible up titration as outpatient.  Plan to use furosemide as outpatient as needed.   Acute hypoxemic respiratory failure due to acute cardiogenic pulmonary edema. Her volume status has improved, persistent 02 desaturation on ambulation, likely due to pulmonary hypertension, chronic core pulmonale.   Plan to continue with supplemental 02 per  as outpatient, will need sleep study and aggressive weight reduction.   HTN (hypertension) Continue with amlodipine, hydralazine, spironolactone and losartan Follow up blood pressure as outpatient.  Needs aggressive lifestyle modifications.   Type 2 diabetes mellitus with hyperlipidemia (HCC) Uncontrolled diabetes mellitus with high Hgb A1c   At the time of discharge patient will continue with glipizide and metformin.  Follow up glucose as outpatient.  Life style modification.   Class 2 obesity Calculated BMI is 35.5  Hypokalemia Renal function stable, volume status has improved  Patient tolerated well diuresis with furosemide, she  will continue with SGLT 2 inh, along with spironolactone As needed furosemide At the time of her discharge her serum cr is 0,78 with K at 3, 5 and serum bicarbonate at 39.  Follow up renal function as outpatient.          Consultants: none  Procedures performed: none  Disposition: Home Diet recommendation:  Cardiac and Carb modified diet DISCHARGE MEDICATION: Allergies as of 08/17/2022       Reactions   Beef-derived Products    Patient does not eat beef   Crestor [rosuvastatin]    MUSCLE ACHES HEADACHES   Diltiazem Hives   Hydrocodone Itching   Other Hives, Swelling, Other (See Comments)   Vision changes; ALLERGIC TO ALL NUTS EXCEPT PEANUTS   Oxycodone Itching   Pork-derived Products    For dietary, patient does not eat pork   Pravachol [pravastatin Sodium] Itching   Joint pain   Simvastatin Hives        Medication List     STOP taking these medications    hydrochlorothiazide 25 MG tablet Commonly known as: HYDRODIURIL       TAKE these medications    acetaminophen 500 MG tablet Commonly known as: TYLENOL Take 500 mg by mouth every 6 (six) hours as needed for mild pain or moderate pain.   amLODipine 5 MG tablet Commonly known as: NORVASC Take 2 tablets by mouth once daily   aspirin EC 81 MG tablet Take 1 tablet (81 mg total) by mouth daily.   blood glucose meter kit and supplies Kit Dispense based on patient and insurance preference. Use up to four times daily as directed. (FOR ICD-9 250.00, 250.01).   dapagliflozin propanediol 10 MG Tabs tablet Commonly known as: FARXIGA Take 1 tablet (10 mg total) by mouth daily. Start taking on: August 18, 2022   ferrous sulfate 325 (65 FE) MG tablet Take 1 tablet (325 mg total) by mouth 2 (two) times daily.   Fish Oil 1000 MG Caps Take by mouth.   furosemide 40 MG tablet Commonly known as: LASIX Take 1 tablet (40 mg total) by mouth daily as needed for edema or fluid (as needed for leg swelling,  shortness of breath or weight gain 2 to 3 lbs in 24 hrs or 5 lbs in 7 days).   glipiZIDE 10 MG tablet Commonly known as: GLUCOTROL Take 1 tablet (10 mg total) by mouth 2 (two) times daily before a meal.   hydrALAZINE 25 MG tablet Commonly known as: APRESOLINE Take 1 tablet (25 mg total) by mouth every 8 (eight) hours.   losartan 100 MG tablet Commonly known as: COZAAR Take 0.5 tablets (50 mg total) by mouth daily.   metFORMIN 1000 MG tablet Commonly known as: GLUCOPHAGE Take 1 tablet (1,000 mg total) by mouth 2 (two) times daily with a meal.   OneTouch Verio test strip Generic drug: glucose blood Use 1 strip to check blood glucose 4 (four) times daily.   OVER THE COUNTER MEDICATION Take 1 capsule by mouth daily. Herb Pill -- unknown name   spironolactone 25 MG tablet Commonly known as: ALDACTONE Take 1 tablet (25 mg total) by mouth daily.               Durable Medical Equipment  (From admission, onward)  Start     Ordered   08/17/22 0916  For home use only DME oxygen  Once       Question Answer Comment  Length of Need 6 Months   Mode or (Route) Nasal cannula   Liters per Minute 2   Frequency Continuous (stationary and portable oxygen unit needed)   Oxygen conserving device Yes   Oxygen delivery system Gas      08/17/22 0915            Follow-up Information     [facility]. Go in 9 day(s).   Specialty: Cardiology Why: Hospital follow up PLEASE bring a current medication list to appointment FREE valet parking, Entrance C, off [employer] information: [address] [id number] [place] [address] [phone]               Discharge Exam: Danley Danker Weights   08/16/22 0517 08/17/22 0014 08/17/22 0512  Weight: 125.9 kg 126.3 kg 126.3 kg   BP (!) 147/85 (BP Location: Right Arm)   Pulse 88   Temp 98.7 F (37.1 C) (Oral)   Resp 19   Ht _0  (1.727 m)    Wt 126.3 kg   LMP 05/09/2012   SpO2 98%   BMI 42.33 kg/m   Patient with improvement in dyspnea and edema no chest pain  Neurology awake and alert ENT with no pallor Cardiovascular with S1 and S2 present and rhythmic with no gallops rubs or murmurs Respiratory with no rales or wheezing' Abdomen with no distention  No lower extremity edema   Condition at discharge: stable  The results of significant diagnostics from this hospitalization (including imaging, microbiology, ancillary and laboratory) are listed below for reference.   Imaging Studies: DG Chest 1 View  Result Date: 08/15/2022 CLINICAL DATA:  Shortness of breath. EXAM: CHEST  1 VIEW COMPARISON:  08/11/2022 and prior studies FINDINGS: Cardiomegaly and mild pulmonary vascular congestion noted. There is no evidence of focal airspace disease, pulmonary edema, suspicious pulmonary nodule/mass, pleural effusion, or pneumothorax. No acute bony abnormalities are identified. IMPRESSION: Cardiomegaly with mild pulmonary vascular congestion. Electronically Signed   By: Margarette Canada M.D.   On: 08/15/2022 09:11   ECHOCARDIOGRAM COMPLETE  Result Date: 08/13/2022    ECHOCARDIOGRAM REPORT   Patient Name:   EMANIE BEHAN Date of Exam: 08/13/2022 Medical Rec #:  [id number]      Height:       68.0 in Accession #:    [id number]     Weight:       234.0 lb Date of Birth:  06/16/1966     BSA:          2.185 m Patient Age:    31 years       BP:           130/75 mmHg Patient Gender: F              HR:           88 bpm. Exam Location:  Inpatient Procedure: 2D Echo, Cardiac Doppler and Color Doppler Indications:    Cardiomyopathy-Unspecified I42.9  History:        Patient has prior history of Echocardiogram examinations, most                 recent 04/25/2018. CHF, Previous Myocardial Infarction,                 Signs/Symptoms:Dyspnea and Chest Pain;  Risk                 Factors:Hypertension, Diabetes and Dyslipidemia.                 ACS (acute coronary  syndrome) (HCC)                  PSVT (paroxysmal supraventricular tachycardia).  Sonographer:    Ronny Flurry Sonographer#2:  Darlina Sicilian RDCS Referring Phys: [id number] ANKIT CHIRAG AMIN IMPRESSIONS  1. Septal flattening consistent with RV volume / pressure overload. Left ventricular ejection fraction, by estimation, is 55 to 60%. The left ventricle has normal function. The left ventricle has no regional wall motion abnormalities. There is mild left  ventricular hypertrophy.  2. Diffiuclt to see RV endocardium . Right ventricular systolic function is moderately reduced. The right ventricular size is mildly enlarged.  3. The mitral valve is normal in structure. Trivial mitral valve regurgitation.  4. The aortic valve is tricuspid. Aortic valve regurgitation is not visualized. Aortic valve sclerosis is present, with no evidence of aortic valve stenosis.  5. The inferior vena cava is normal in size with greater than 50% respiratory variability, suggesting right atrial pressure of 3 mmHg. FINDINGS  Left Ventricle: Septal flattening consistent with RV volume / pressure overload. Left ventricular ejection fraction, by estimation, is 55 to 60%. The left ventricle has normal function. The left ventricle has no regional wall motion abnormalities. The left ventricular internal cavity size was normal in size. There is mild left ventricular hypertrophy. Right Ventricle: Diffiuclt to see RV endocardium. The right ventricular size is mildly enlarged. Right vetricular wall thickness was not assessed. Right ventricular systolic function is moderately reduced. Left Atrium: Left atrial size was normal in size. Right Atrium: Right atrial size was normal in size. Pericardium: There is no evidence of pericardial effusion. Mitral Valve: The mitral valve is normal in structure. Trivial mitral valve regurgitation. Tricuspid Valve: The tricuspid valve is normal in structure. Tricuspid valve regurgitation is mild. Aortic Valve: The  aortic valve is tricuspid. Aortic valve regurgitation is not visualized. Aortic valve sclerosis is present, with no evidence of aortic valve stenosis. Aortic valve peak gradient measures 13.8 mmHg. Pulmonic Valve: The pulmonic valve was normal in structure. Pulmonic valve regurgitation is mild. Aorta: The aortic root and ascending aorta are structurally normal, with no evidence of dilitation. Venous: The inferior vena cava is normal in size with greater than 50% respiratory variability, suggesting right atrial pressure of 3 mmHg. IAS/Shunts: No atrial level shunt detected by color flow Doppler.  LEFT VENTRICLE PLAX 2D LVIDd:         4.50 cm   Diastology LVIDs:         3.10 cm   LV e' medial:    4.57 cm/s LV PW:         2.00 cm   LV E/e' medial:  14.4 LV IVS:        1.30 cm   LV e' lateral:   6.64 cm/s LVOT diam:     2.30 cm   LV E/e' lateral: 9.9 LV SV:         94 LV SV Index:   43 LVOT Area:     4.15 cm  RIGHT VENTRICLE             IVC RV Basal diam:  4.50 cm     IVC diam: 1.70 cm RV Mid diam:    3.80 cm RV S prime:  15.60 cm/s TAPSE (M-mode): 2.8 cm LEFT ATRIUM             Index        RIGHT ATRIUM           Index LA Vol (A2C):   34.1 ml 15.61 ml/m  RA Area:     16.40 cm LA Vol (A4C):   58.3 ml 26.68 ml/m  RA Volume:   40.50 ml  18.54 ml/m LA Biplane Vol: 44.6 ml 20.41 ml/m  AORTIC VALVE AV Area (Vmax): 2.64 cm AV Vmax:        186.00 cm/s AV Peak Grad:   13.8 mmHg LVOT Vmax:      118.00 cm/s LVOT Vmean:     90.600 cm/s LVOT VTI:       0.226 m  AORTA Ao Root diam: 3.60 cm Ao Asc diam:  3.20 cm MITRAL VALVE MV Area (PHT): 2.93 cm    SHUNTS MV Decel Time: 259 msec    Systemic VTI:  0.23 m MV E velocity: 66.00 cm/s  Systemic Diam: 2.30 cm MV A velocity: 86.10 cm/s MV E/A ratio:  0.77 Dorris Carnes MD Electronically signed by Dorris Carnes MD Signature Date/Time: 08/13/2022/1:28:55 PM    Final    DG Chest 2 View  Result Date: 08/11/2022 CLINICAL DATA:  SOB, hypoxic EXAM: CHEST - 2 VIEW COMPARISON:  Chest  x-ray 01/20/2022, CT angio chest 01/20/2022 FINDINGS: The heart and mediastinal contours are within normal limits. Prominent hilar vasculature. Interval increase in cardiac silhouette. No focal consolidation. Mildly increased interstitial markings. No pleural effusion. No pneumothorax. No acute osseous abnormality. IMPRESSION: 1. Interval increase in cardiac silhouette. Underlying pericardial effusion not fully excluded. 2. Mild pulmonary edema. Electronically Signed   By: Iven Finn M.D.   On: 08/11/2022 21:17    Microbiology: Results for orders placed or performed during the hospital encounter of 08/11/22  Resp Panel by RT-PCR (Flu A&B, Covid) Anterior Nasal Swab     Status: None   Collection Time: 08/11/22  7:29 PM   Specimen: Anterior Nasal Swab  Result Value Ref Range Status   SARS Coronavirus 2 by RT PCR NEGATIVE NEGATIVE Final    Comment: (NOTE) SARS-CoV-2 target nucleic acids are NOT DETECTED.  The SARS-CoV-2 RNA is generally detectable in upper respiratory specimens during the acute phase of infection. The lowest concentration of SARS-CoV-2 viral copies this assay can detect is 138 copies/mL. A negative result does not preclude SARS-Cov-2 infection and should not be used as the sole basis for treatment or other patient management decisions. A negative result may occur with  improper specimen collection/handling, submission of specimen other than nasopharyngeal swab, presence of viral mutation(s) within the areas targeted by this assay, and inadequate number of viral copies(<138 copies/mL). A negative result must be combined with clinical observations, patient history, and epidemiological information. The expected result is Negative.  Fact Sheet for Patients:  EntrepreneurPulse.com.au  Fact Sheet for Healthcare Providers:  IncredibleEmployment.be  This test is no t yet approved or cleared by the [place] FDA and  has been authorized  for detection and/or diagnosis of SARS-CoV-2 by FDA under an Emergency Use Authorization (EUA). This EUA will remain  in effect (meaning this test can be used) for the duration of the COVID-19 declaration under Section 564(b)(1) of the Act, 21 U.S.C.section 360bbb-3(b)(1), unless the authorization is terminated  or revoked sooner.       Influenza A by PCR NEGATIVE NEGATIVE Final   Influenza B by PCR  NEGATIVE NEGATIVE Final    Comment: (NOTE) The Xpert Xpress SARS-CoV-2/FLU/RSV plus assay is intended as an aid in the diagnosis of influenza from Nasopharyngeal swab specimens and should not be used as a sole basis for treatment. Nasal washings and aspirates are unacceptable for Xpert Xpress SARS-CoV-2/FLU/RSV testing.  Fact Sheet for Patients: EntrepreneurPulse.com.au  Fact Sheet for Healthcare Providers: IncredibleEmployment.be  This test is not yet approved or cleared by the [place] FDA and has been authorized for detection and/or diagnosis of SARS-CoV-2 by FDA under an Emergency Use Authorization (EUA). This EUA will remain in effect (meaning this test can be used) for the duration of the COVID-19 declaration under Section 564(b)(1) of the Act, 21 U.S.C. section 360bbb-3(b)(1), unless the authorization is terminated or revoked.  Performed at [facility], [place] [address], [place], [place] [address]     Labs: CBC: Recent Labs  Lab 08/11/22 1956 08/12/22 1540 08/13/22 0049  WBC 7.2 5.5 6.8  NEUTROABS 4.3  --   --   HGB 13.5 14.3 14.7  HCT 47.5* 51.4* 50.4*  MCV 72.7* 73.6* 73.1*  PLT 453* 386 921*   Basic Metabolic Panel: Recent Labs  Lab 08/12/22 1540 08/13/22 0049 08/14/22 0049 08/15/22 0045 08/16/22 0041 08/17/22 0052  NA  --  136 138 139 138 141  K  --  4.2 3.9 3.3* 3.7 3.5  CL  --  91* 88* 87* 89* 91*  CO2  --  37* 38* 42* 40* 39*  GLUCOSE  --  152* 131* 143* 102* 118*  BUN  --  _0 CREATININE 1.11* 1.19* 1.03* 0.90 0.86 0.78  CALCIUM  --  9.0 9.1 9.2 9.0 9.2  MG 1.9 2.1 2.6* 2.1  --   --    Liver Function Tests: Recent Labs  Lab 08/11/22 1956  AST 24  ALT 49*  ALKPHOS 46  BILITOT 0.7  PROT 6.9  ALBUMIN 3.4*   CBG: Recent Labs  Lab 08/16/22 0605 08/16/22 1121 08/16/22 1614 08/16/22 2107 08/17/22 0607  GLUCAP 155* 163* 216* 155* 125*    Discharge time spent: greater than 30 minutes.  Signed: Tawni Millers, MD [facility] 08/17/2022

## 2022-08-17 NOTE — Progress Notes (Addendum)
Mobility Specialist Progress Note:   08/17/22 1145  Mobility  Activity Ambulated with assistance in hallway  Level of Assistance Standby assist, set-up cues, supervision of patient - no hands on  Assistive Device Front wheel walker  Distance Ambulated (ft) 60 ft (30+30)  Activity Response Tolerated fair  Mobility Referral Yes  $Mobility charge 1 Mobility   Pt received in bed willing to participate in mobility. Complaints of feeling dizzy BP (147/90). Pt required a seated break half way through ambulation. After ambulation pt still felt dizzy (BP 156/94). Left EOB with call bell in reach and all needs met.   Gareth Eagle Anaiya Wisinski Mobility Specialist Please contact via [employer] or  Rehab Office at [phone]

## 2022-08-17 NOTE — Progress Notes (Signed)
SATURATION QUALIFICATIONS: (This note is used to comply with regulatory documentation for home oxygen)  Patient Saturations on Room Air at Rest = 79%  Patient Saturations on 2 Liters of oxygen while Ambulating = 92%  Please briefly explain why patient needs home oxygen: Patient is hypoxic on room air.

## 2022-08-17 NOTE — Progress Notes (Signed)
Nurse requested Mobility Specialist to perform oxygen saturation test with pt which includes removing pt from oxygen both at rest and while ambulating.  Below are the results from that testing.     Patient Saturations on Room Air at Rest = spO2 100%  Patient Saturations on Room Air while Ambulating = sp02 86% .    Patient Saturations on 2 Liters of oxygen while Ambulating = sp02 97%  At end of testing pt left in room on 2  Liters of oxygen.  Reported results to nurse.

## 2022-08-19 ENCOUNTER — Telehealth: Payer: Self-pay

## 2022-08-19 NOTE — Telephone Encounter (Signed)
Transition Care Management Follow-up Telephone Call Date of discharge and from where: 08/17/22 How have you been since you were released from the hospital? Per pt she has been taking it slow Any questions or concerns? No  Items Reviewed: Did the pt receive and understand the discharge instructions provided? Yes  Medications obtained and verified? Yes  Other? No  Any new allergies since your discharge? No  Dietary orders reviewed? Yes Do you have support at home? Yes   Follow up appointments reviewed:  PCP Hospital f/u appt confirmed? Yes  Scheduled to see  on 09/02/22 @ 1:20. [facility] f/u appt confirmed? Yes  Scheduled to see cardiology 08/26/22  @ 3pm. Are transportation arrangements needed? No  If their condition worsens, is the pt aware to call PCP or go to the Emergency Dept.? Yes Was the patient provided with contact information for the PCP's office or ED? Yes Was to pt encouraged to call back with questions or concerns? Yes   Elyse Jarvis RMA

## 2022-08-26 ENCOUNTER — Encounter (HOSPITAL_COMMUNITY): Payer: Self-pay

## 2022-08-26 ENCOUNTER — Other Ambulatory Visit (HOSPITAL_COMMUNITY): Payer: Self-pay | Admitting: *Deleted

## 2022-08-26 ENCOUNTER — Ambulatory Visit (HOSPITAL_COMMUNITY)
Admit: 2022-08-26 | Discharge: 2022-08-26 | Disposition: A | Discharge status: Home / Self Care | Payer: Commercial Managed Care - HMO | Attending: Adult Health | Admitting: Adult Health

## 2022-08-26 ENCOUNTER — Telehealth (HOSPITAL_COMMUNITY): Payer: Self-pay

## 2022-08-26 VITALS — BP 160/100 | HR 101 | Ht 68.0 in | Wt 277.6 lb

## 2022-08-26 DIAGNOSIS — I272 Pulmonary hypertension, unspecified: Secondary | ICD-10-CM

## 2022-08-26 DIAGNOSIS — I10 Essential (primary) hypertension: Secondary | ICD-10-CM

## 2022-08-26 DIAGNOSIS — I5033 Acute on chronic diastolic (congestive) heart failure: Secondary | ICD-10-CM | POA: Diagnosis present

## 2022-08-26 DIAGNOSIS — E1159 Type 2 diabetes mellitus with other circulatory complications: Secondary | ICD-10-CM

## 2022-08-26 LAB — COMPREHENSIVE METABOLIC PANEL
ALT: 19 U/L (ref 0–44)
AST: 21 U/L (ref 15–41)
Albumin: 3.8 g/dL (ref 3.5–5.0)
Alkaline Phosphatase: 41 U/L (ref 38–126)
Anion gap: 10 (ref 5–15)
BUN: <5 mg/dL — ABNORMAL LOW (ref 6–20)
CO2: 33 mmol/L — ABNORMAL HIGH (ref 22–32)
Calcium: 9.3 mg/dL (ref 8.9–10.3)
Chloride: 95 mmol/L — ABNORMAL LOW (ref 98–111)
Creatinine, Ser: 0.69 mg/dL (ref 0.44–1.00)
GFR, Estimated: >60 mL/min (ref >60)
Glucose, Bld: 75 mg/dL (ref 70–99)
Potassium: 3.9 mmol/L (ref 3.5–5.1)
Sodium: 138 mmol/L (ref 135–145)
Total Bilirubin: 0.7 mg/dL (ref 0.3–1.2)
Total Protein: 7.8 g/dL (ref 6.5–8.1)

## 2022-08-26 LAB — BRAIN NATRIURETIC PEPTIDE: B Natriuretic Peptide: 75.7 pg/mL (ref 0.0–100.0)

## 2022-08-26 LAB — SEDIMENTATION RATE: Sed Rate: 1 mm/hr (ref 0–22)

## 2022-08-26 LAB — CBC
HCT: 49.9 % — ABNORMAL HIGH (ref 36.0–46.0)
Hemoglobin: 14 g/dL (ref 12.0–15.0)
MCH: 20.6 pg — ABNORMAL LOW (ref 26.0–34.0)
MCHC: 28.1 g/dL — ABNORMAL LOW (ref 30.0–36.0)
MCV: 73.6 fL — ABNORMAL LOW (ref 80.0–100.0)
Platelets: 397 10*3/uL (ref 150–400)
RBC: 6.78 MIL/uL — ABNORMAL HIGH (ref 3.87–5.11)
RDW: 18.1 % — ABNORMAL HIGH (ref 11.5–15.5)
WBC: 6 10*3/uL (ref 4.0–10.5)
nRBC: 0 % (ref 0.0–0.2)

## 2022-08-26 LAB — HEPATITIS A ANTIBODY, IGM: Hep A IgM: NONREACTIVE

## 2022-08-26 LAB — HEPATITIS C ANTIBODY: HCV Ab: NONREACTIVE

## 2022-08-26 MED ORDER — GLIPIZIDE 10 MG PO TABS: 10.0000 mg | ORAL_TABLET | Freq: Two times a day (BID) | ORAL | 3 refills | Status: DC

## 2022-08-26 MED ORDER — SPIRONOLACTONE 25 MG PO TABS: 25.0000 mg | ORAL_TABLET | Freq: Every day | ORAL | 3 refills | Status: DC

## 2022-08-26 MED ORDER — ENTRESTO 49-51 MG PO TABS: 1.0000 | ORAL_TABLET | Freq: Two times a day (BID) | ORAL | 6 refills | Status: DC

## 2022-08-26 MED ORDER — LOSARTAN POTASSIUM 100 MG PO TABS: 50.0000 mg | ORAL_TABLET | Freq: Every day | ORAL | 3 refills | Status: DC

## 2022-08-26 NOTE — Progress Notes (Signed)
   Date:  08/26/2022 STOP BANG RISK ASSESSMENT S (snore) Have you been told that you snore?     YES   T (tired) Are you often tired, fatigued, or sleepy during the day?   YES  O (obstruction) Do you stop breathing, choke, or gasp during sleep? YES   P (pressure) Do you have or are you being treated for high blood pressure? YES   B (BMI) Is your body index greater than 35 kg/m? YES   A (age) Are you 55 years old or older? YES   N (neck) Do you have a neck circumference greater than 16 inches?   YES   G (gender) Are you a female? NO   TOTAL STOP/BANG "YES" ANSWERS 7

## 2022-08-26 NOTE — Patient Instructions (Addendum)
Stop Losartan Start Entresto 49/51 mg twice daily - free 30 day coupon Refill for Arlyce Harman sent to local pharmacy Sleep study equipment and instructions provided to you at this visit. We will call you to proceed with study after prior authorization is obtained from your insurance company. Return to HF clinic in 2 weeks - scheduled.  We have scheduled you for right heart catheterization - see instructions below.   You are scheduled for a Cardiac Catheterization on Wednesday, December 13 with Dr. Glori Bickers.  1. Please arrive at the [facility] (Main Entrance A) at [facility]: [address] [place], [place] [address] at 6:30 AM (This time is two hours before your procedure to ensure your preparation). Free valet parking service is available.   Special note: Every effort is made to have your procedure done on time. Please understand that emergencies sometimes delay scheduled procedures.  2. Diet: Do not eat solid foods after midnight.  The patient may have clear liquids until 5am upon the day of the procedure.   On the morning of your procedure, take your medications with small sip of water.  5. Plan for one night stay--bring personal belongings. 6. Bring a current list of your medications and current insurance cards. 7. You MUST have a responsible person to drive you home. 8. Someone MUST be with you the first 24 hours after you arrive home or your discharge will be delayed. 9. Please wear clothes that are easy to get on and off and wear slip-on shoes.

## 2022-08-26 NOTE — Telephone Encounter (Signed)
Called to confirm Heart & Vascular Transitions of Care appointment at 3pm today. Patient reminded to bring all medications and pill box organizer with them. Confirmed patient has transportation. Gave directions, instructed to utilize [place] parking.  Confirmed appointment prior to ending call.   Pricilla Holm, MSN, RN Heart Failure Nurse Navigator

## 2022-08-26 NOTE — H&P (View-Only) (Signed)
HEART & VASCULAR TRANSITION OF CARE CONSULT NOTE     Referring Physician: Dr Cathlean Sauer  Primary Care:Lazaro Arms NP  Primary Cardiologist: Dr Marlou Porch   HPI: Referred to clinic by Dr Cathlean Sauer for heart failure consultation.   Claudia [name] is a 56 year old with a history of HFpEF, NSVT, DMII, IDA, and HTN.   Admitted with April 2023 with CAP. CTA negative for PE. Treated with antibiotics.   Admitted with A/C HFpEF. Initially on oxygen. Diuresed with IV lasix and transitioned to lasix as needed. Placed on SGLT2i. Discharge weight 278 pounds.   Overall feeling tired. She remains SOB with exertion. She has SOB exertion for years.  Denies PND/Orthopnea. Weight at home 273-277 pounds. On 2 liters oxygen. Oxygen sats drop on room air to 84-87%. Appetite ok. No fever or chills. Weight at home  pounds. Taking all medications. Ran out of spiro today. Works at [employer] in [employer]. Lives with her husband.    Cardiac Testing  Echo 2013: EF 40-45 (in setting of HTN urgency) Echo 7/18: EF 55-60 normal RV  Echo 7/19: EF 55-60 normal RV Echo 08/17/2022: EF 55-60% RV moderately reduced.  Cath 7/19: Normal coronary arteries  Review of Systems: [y] = yes, _0  = no   General: Weight gain _1 ; Weight loss _2 ; Anorexia _3 ; Fatigue [ Y]; Fever _4 ; Chills _5 ; Weakness [Y ]  Cardiac: Chest pain/pressure _6 ; Resting SOB _7 ; Exertional SOB [ Y]; Orthopnea [ Y]; Pedal Edema _8 ; Palpitations _9 ; Syncope _10 ; Presyncope _11 ; Paroxysmal nocturnal dyspnea_12   Pulmonary: Cough _13 ; Wheezing_14 ; Hemoptysis_15 ; Sputum _16 ; Snoring _17   GI: Vomiting_18 ; Dysphagia_19 ; Melena_20 ; Hematochezia _21 ; Heartburn_22 ; Abdominal pain _23 ; Constipation _24 ; Diarrhea _25 ; BRBPR _26   GU: Hematuria_27 ; Dysuria _28 ; Nocturia_29   Vascular: Pain in legs with walking _30 ; Pain in feet with lying flat _31 ; Non-healing sores _32 ; Stroke _33 ; TIA _34 ; Slurred speech _35 ;  Neuro: Headaches_36 ; Vertigo_37 ;  Seizures_38 ; Paresthesias_39 ;Blurred vision _40 ; Diplopia _41 ; Vision changes _42   Ortho/Skin: Arthritis _43 ; Joint pain [Y ]; Muscle pain _44 ; Joint swelling _45 ; Back Pain [Y ]; Rash _46   Psych: Depression_47 ; Anxiety_48   Heme: Bleeding problems _49 ; Clotting disorders _50 ; Anemia [Y ]  Endocrine: Diabetes [ Y]; Thyroid dysfunction_51    Past Medical History:  Diagnosis Date   Diabetes mellitus    Fibroids    Headache(784.0)    History of echocardiogram    Echo 7/18: Moderate concentric LVH, EF 55-60, normal wall motion, grade 1 diastolic dysfunction, calcified aortic leaflets   Hypercholesterolemia    Hyperlipidemia    Hypertension    Osteoarthritis    , with mild meniscus tear, followed by orthopedics--Dr Gladstone Lighter 04/29/10   Right knee pain    Seizures ([place])    as child   STEMI (ST elevation myocardial infarction) ([place])    Vitamin D deficiency     Current Outpatient Medications  Medication Sig Dispense Refill   acetaminophen (TYLENOL) 500 MG tablet Take 500 mg by mouth every 6 (six) hours as needed for mild pain or moderate pain.     amLODipine (NORVASC) 5 MG tablet Take 2 tablets by mouth once daily 60 tablet 0   aspirin 81 MG EC tablet Take 1  tablet (81 mg total) by mouth daily. 30 tablet 6   blood glucose meter kit and supplies KIT Dispense based on patient and insurance preference. Use up to four times daily as directed. (FOR ICD-9 250.00, 250.01). 1 each 0   dapagliflozin propanediol (FARXIGA) 10 MG TABS tablet Take 1 tablet (10 mg total) by mouth daily. 30 tablet 0   ferrous sulfate 325 (65 FE) MG tablet Take 1 tablet (325 mg total) by mouth daily with breakfast. 60 tablet 6   furosemide (LASIX) 40 MG tablet Take 1 tablet (40 mg total) by mouth daily as needed for edema or fluid (as needed for leg swelling, shortness of breath or weight gain 2 to 3 lbs in 24 hrs or 5 lbs in 7 days). 30 tablet 0   hydrALAZINE (APRESOLINE) 25 MG tablet Take 1 tablet (25 mg total) by mouth every  8 (eight) hours. 270 each 3   metFORMIN (GLUCOPHAGE) 1000 MG tablet Take 1 tablet (1,000 mg total) by mouth 2 (two) times daily with a meal. 180 tablet 3   Omega-3 Fatty Acids (FISH OIL) 1000 MG CAPS Take by mouth.      OVER THE COUNTER MEDICATION Take 1 capsule by mouth daily. Herb Pill -- unknown name     glipiZIDE (GLUCOTROL) 10 MG tablet Take 1 tablet (10 mg total) by mouth 2 (two) times daily before a meal. 180 tablet 3   losartan (COZAAR) 100 MG tablet Take 0.5 tablets (50 mg total) by mouth daily. 45 tablet 3   spironolactone (ALDACTONE) 25 MG tablet Take 1 tablet (25 mg total) by mouth daily. 90 tablet 3   No current facility-administered medications for this encounter.    Allergies  Allergen Reactions   Beef-Derived Products     Patient does not eat beef   Crestor [Rosuvastatin]     MUSCLE ACHES HEADACHES    Diltiazem Hives   Hydrocodone Itching   Other Hives, Swelling and Other (See Comments)    Vision changes; ALLERGIC TO ALL NUTS EXCEPT PEANUTS   Oxycodone Itching   Pork-Derived Products     For dietary, patient does not eat pork   Pravachol [Pravastatin Sodium] Itching    Joint pain   Simvastatin Hives      Social History   Socioeconomic History   Marital status: Married    Spouse name: Claudia [name]   Number of children: 6   Years of education: Not on file   Highest education level: High school graduate  Occupational History   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   Smoking status: Never   Smokeless tobacco: Never  Vaping Use   Vaping Use: Never used  Substance and Sexual Activity   Alcohol use: No   Drug use: No   Sexual activity: Yes  Other Topics Concern   Not on file  Social History Narrative   Not on file   Social Determinants of Health   Financial Resource Strain: Medium Risk (08/14/2022)   Overall Financial Resource Strain (CARDIA)    Difficulty of Paying Living Expenses: Somewhat hard  Food Insecurity: No Food Insecurity  (08/16/2022)   Hunger Vital Sign    Worried About Running Out of Food in the Last Year: Never true    Ran Out of Food in the Last Year: Never true  Transportation Needs: No Transportation Needs (08/16/2022)   PRAPARE - Hydrologist (Medical): No    Lack of Transportation (Non-Medical): No  Physical Activity:  Not on file  Stress: Not on file  Social Connections: Not on file  Intimate Partner Violence: Not At Risk (08/16/2022)   Humiliation, Afraid, Rape, and Kick questionnaire    Fear of Current or Ex-Partner: No    Emotionally Abused: No    Physically Abused: No    Sexually Abused: No      Family History  Problem Relation Age of Onset   Hypertension Mother    Diabetes Mother    Hypercholesterolemia Mother    Hypertension Sister    CVA Maternal Grandmother    Hypertension Maternal Grandmother    Diabetes Maternal Grandmother    Heart attack Neg Hx     Vitals:   08/26/22 1504 08/26/22 1505  BP: (!) 170/110 (!) 160/100  Pulse: (!) 101   SpO2: 95%   Weight: 125.9 kg (277 lb 9.6 oz)   Height: _0  (1.727 m)    Wt Readings from Last 3 Encounters:  08/26/22 125.9 kg (277 lb 9.6 oz)  08/17/22 126.3 kg (278 lb 6.4 oz)  07/20/22 130.8 kg (288 lb 6.4 oz)    PHYSICAL EXAM: General: Walked in the clinic.  No respiratory difficulty HEENT: Mallampati Class 4.  Neck: supple. no JVD. Carotids 2+ bilat; no bruits. No lymphadenopathy or thryomegaly appreciated. Cor: PMI nondisplaced. Regular rate & rhythm. No rubs, gallops or murmurs. Lungs: clear On 2 liters  Abdomen: obese, soft, nontender, nondistended. No hepatosplenomegaly. No bruits or masses. Good bowel sounds. Extremities: no cyanosis, clubbing, rash, edema Neuro: alert & oriented x 3, cranial nerves grossly intact. moves all 4 extremities w/o difficulty. Affect pleasant.  ECG: SR 90 bpm personally checked.    ASSESSMENT & PLAN: 1. Chronic HFpEF and RV moderately reduced ( new) -Previous  ECHO 2019 EF 55-60% with normal RV. Most recent Echo 07/2022 EF 55-60% & RV moderately reduced. She had VQ scan April 2023 which was negative. I am concerned she has pulmonary hypertension -->WHO Group III. Now requiring oxygen. Suspected sleep apnea. Will need RHC to further assess and sleep study.  NYHA III, chronically for years.  GDMT  Diuretic-Volume status ok. Continue lasix as needed.  BB-Hold off but can consider as an outpatient. .  Ace/ARB/ARNI- Stop losartan. Start entresto 49-51 mg twice a day  MRA- refill spiro.  SGLT2i- Continue farxiga 10 mg daily  - Check ANA, ANCA, sed rate, RF, SCL-70, CMET, Hep A B C antibodies., CBC, BNP, and anticentromere antibody.   2. HTN -Uncontrolled.  - Continue current medications except will losartan. Start entresto 49-51 mg twice a day.  3. Obesity  Body mass index is 42.21 kg/m. -Discussed weight loss. May need GLPi.   4. DMII -08/12/22 Hgb A1C 9.3 -Needs follow up with PCP.  5. Chronic Hypoxic Respiratory Failure Recently placed on oxygen at discharge during hospital admit. Reamins on  2 liters [place]. O2 sats stable with oxygen but drops < 88% on room air.    6. Suspected sleep apnea  - Stops breathing at night per husband.  Mallampati Class 4.  -Needs sleep study.    Referred to HFSW ( Financial ): Yes  Refer to Pharmacy:  No Refer to Home Health:  No Refer to Advanced Heart Failure Clinic: Yes , Dr Haroldine Laws  Refer to General Cardiology: Shared with Dr Marlou Porch.   Follow up 2 weeks with TOC APP. Set up [place] and sleep study. Needs BMET next week.   Provide letter to remain out of work.   Discussed with Dr Haroldine Laws.  Amy Clegg NP-C  3:14 PM  Patient seen and examined with the above-signed Advanced Practice Provider and/or Housestaff. I personally reviewed laboratory data, imaging studies and relevant notes. I independently examined the patient and formulated the important aspects of the plan. I have edited the note to reflect  any of my changes or salient points. I have personally discussed the plan with the patient and/or family.  56 y/o morbidly obese woman with HTN, DM2 and HFPEF.   Recently admitted with volume overload. Diuresed and started on SGLT2i.   Referred to [facility] for f/u. Echo with evidence of PAH. NYHA III symptoms. Volume status hard to assess but looks ok. BP markedly elevated  General:  Obese woman. No resp difficulty HEENT: normal Neck: supple. JVP hard to see. Carotids 2+ bilat; no bruits. No lymphadenopathy or thryomegaly appreciated. Cor: PMI nondisplaced. Regular rate & rhythm. No rubs, gallops or murmurs. Lungs: clear Abdomen: obese soft, nontender, nondistended. No hepatosplenomegaly. No bruits or masses. Good bowel sounds. Extremities: no cyanosis, clubbing, rash, edema Neuro: alert & orientedx3, cranial nerves grossly intact. moves all 4 extremities w/o difficulty. Affect pleasant  NYHA III. Agree with Entresto for better BP control. Suspect WHO Group 2 & 3 PH. Will need RHC, serologies and sleep study.   Glori Bickers, MD  8:44 AM

## 2022-08-26 NOTE — Progress Notes (Signed)
HEART & VASCULAR TRANSITION OF CARE CONSULT NOTE     Referring Physician: Dr Cathlean Sauer  Primary Care:Lazaro Arms NP  Primary Cardiologist: Dr Marlou Porch   HPI: Referred to clinic by Dr Cathlean Sauer for heart failure consultation.   Claudia [name] is a 56 year old with a history of HFpEF, NSVT, DMII, IDA, and HTN.   Admitted with April 2023 with CAP. CTA negative for PE. Treated with antibiotics.   Admitted with A/C HFpEF. Initially on oxygen. Diuresed with IV lasix and transitioned to lasix as needed. Placed on SGLT2i. Discharge weight 278 pounds.   Overall feeling tired. She remains SOB with exertion. She has SOB exertion for years.  Denies PND/Orthopnea. Weight at home 273-277 pounds. On 2 liters oxygen. Oxygen sats drop on room air to 84-87%. Appetite ok. No fever or chills. Weight at home  pounds. Taking all medications. Ran out of spiro today. Works at [employer] in [employer]. Lives with her husband.    Cardiac Testing  Echo 2013: EF 40-45 (in setting of HTN urgency) Echo 7/18: EF 55-60 normal RV  Echo 7/19: EF 55-60 normal RV Echo 08/17/2022: EF 55-60% RV moderately reduced.  Cath 7/19: Normal coronary arteries  Review of Systems: [y] = yes, _0  = no   General: Weight gain _1 ; Weight loss _2 ; Anorexia _3 ; Fatigue [ Y]; Fever _4 ; Chills _5 ; Weakness [Y ]  Cardiac: Chest pain/pressure _6 ; Resting SOB _7 ; Exertional SOB [ Y]; Orthopnea [ Y]; Pedal Edema _8 ; Palpitations _9 ; Syncope _10 ; Presyncope _11 ; Paroxysmal nocturnal dyspnea_12   Pulmonary: Cough _13 ; Wheezing_14 ; Hemoptysis_15 ; Sputum _16 ; Snoring _17   GI: Vomiting_18 ; Dysphagia_19 ; Melena_20 ; Hematochezia _21 ; Heartburn_22 ; Abdominal pain _23 ; Constipation _24 ; Diarrhea _25 ; BRBPR _26   GU: Hematuria_27 ; Dysuria _28 ; Nocturia_29   Vascular: Pain in legs with walking _30 ; Pain in feet with lying flat _31 ; Non-healing sores _32 ; Stroke _33 ; TIA _34 ; Slurred speech _35 ;  Neuro: Headaches_36 ; Vertigo_37 ;  Seizures_38 ; Paresthesias_39 ;Blurred vision _40 ; Diplopia _41 ; Vision changes _42   Ortho/Skin: Arthritis _43 ; Joint pain [Y ]; Muscle pain _44 ; Joint swelling _45 ; Back Pain [Y ]; Rash _46   Psych: Depression_47 ; Anxiety_48   Heme: Bleeding problems _49 ; Clotting disorders _50 ; Anemia [Y ]  Endocrine: Diabetes [ Y]; Thyroid dysfunction_51    Past Medical History:  Diagnosis Date   Diabetes mellitus    Fibroids    Headache(784.0)    History of echocardiogram    Echo 7/18: Moderate concentric LVH, EF 55-60, normal wall motion, grade 1 diastolic dysfunction, calcified aortic leaflets   Hypercholesterolemia    Hyperlipidemia    Hypertension    Osteoarthritis    , with mild meniscus tear, followed by orthopedics--Dr Gladstone Lighter 04/29/10   Right knee pain    Seizures ([place])    as child   STEMI (ST elevation myocardial infarction) ([place])    Vitamin D deficiency     Current Outpatient Medications  Medication Sig Dispense Refill   acetaminophen (TYLENOL) 500 MG tablet Take 500 mg by mouth every 6 (six) hours as needed for mild pain or moderate pain.     amLODipine (NORVASC) 5 MG tablet Take 2 tablets by mouth once daily 60 tablet 0   aspirin 81 MG EC tablet Take 1  tablet (81 mg total) by mouth daily. 30 tablet 6   blood glucose meter kit and supplies KIT Dispense based on patient and insurance preference. Use up to four times daily as directed. (FOR ICD-9 250.00, 250.01). 1 each 0   dapagliflozin propanediol (FARXIGA) 10 MG TABS tablet Take 1 tablet (10 mg total) by mouth daily. 30 tablet 0   ferrous sulfate 325 (65 FE) MG tablet Take 1 tablet (325 mg total) by mouth daily with breakfast. 60 tablet 6   furosemide (LASIX) 40 MG tablet Take 1 tablet (40 mg total) by mouth daily as needed for edema or fluid (as needed for leg swelling, shortness of breath or weight gain 2 to 3 lbs in 24 hrs or 5 lbs in 7 days). 30 tablet 0   hydrALAZINE (APRESOLINE) 25 MG tablet Take 1 tablet (25 mg total) by mouth every  8 (eight) hours. 270 each 3   metFORMIN (GLUCOPHAGE) 1000 MG tablet Take 1 tablet (1,000 mg total) by mouth 2 (two) times daily with a meal. 180 tablet 3   Omega-3 Fatty Acids (FISH OIL) 1000 MG CAPS Take by mouth.      OVER THE COUNTER MEDICATION Take 1 capsule by mouth daily. Herb Pill -- unknown name     glipiZIDE (GLUCOTROL) 10 MG tablet Take 1 tablet (10 mg total) by mouth 2 (two) times daily before a meal. 180 tablet 3   losartan (COZAAR) 100 MG tablet Take 0.5 tablets (50 mg total) by mouth daily. 45 tablet 3   spironolactone (ALDACTONE) 25 MG tablet Take 1 tablet (25 mg total) by mouth daily. 90 tablet 3   No current facility-administered medications for this encounter.    Allergies  Allergen Reactions   Beef-Derived Products     Patient does not eat beef   Crestor [Rosuvastatin]     MUSCLE ACHES HEADACHES    Diltiazem Hives   Hydrocodone Itching   Other Hives, Swelling and Other (See Comments)    Vision changes; ALLERGIC TO ALL NUTS EXCEPT PEANUTS   Oxycodone Itching   Pork-Derived Products     For dietary, patient does not eat pork   Pravachol [Pravastatin Sodium] Itching    Joint pain   Simvastatin Hives      Social History   Socioeconomic History   Marital status: Married    Spouse name: Doren Custard   Number of children: 6   Years of education: Not on file   Highest education level: High school graduate  Occupational History   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   Smoking status: Never   Smokeless tobacco: Never  Vaping Use   Vaping Use: Never used  Substance and Sexual Activity   Alcohol use: No   Drug use: No   Sexual activity: Yes  Other Topics Concern   Not on file  Social History Narrative   Not on file   Social Determinants of Health   Financial Resource Strain: Medium Risk (08/14/2022)   Overall Financial Resource Strain (CARDIA)    Difficulty of Paying Living Expenses: Somewhat hard  Food Insecurity: No Food Insecurity  (08/16/2022)   Hunger Vital Sign    Worried About Running Out of Food in the Last Year: Never true    Ran Out of Food in the Last Year: Never true  Transportation Needs: No Transportation Needs (08/16/2022)   PRAPARE - Hydrologist (Medical): No    Lack of Transportation (Non-Medical): No  Physical Activity:  Not on file  Stress: Not on file  Social Connections: Not on file  Intimate Partner Violence: Not At Risk (08/16/2022)   Humiliation, Afraid, Rape, and Kick questionnaire    Fear of Current or Ex-Partner: No    Emotionally Abused: No    Physically Abused: No    Sexually Abused: No      Family History  Problem Relation Age of Onset   Hypertension Mother    Diabetes Mother    Hypercholesterolemia Mother    Hypertension Sister    CVA Maternal Grandmother    Hypertension Maternal Grandmother    Diabetes Maternal Grandmother    Heart attack Neg Hx     Vitals:   08/26/22 1504 08/26/22 1505  BP: (!) 170/110 (!) 160/100  Pulse: (!) 101   SpO2: 95%   Weight: 125.9 kg (277 lb 9.6 oz)   Height: _0  (1.727 m)    Wt Readings from Last 3 Encounters:  08/26/22 125.9 kg (277 lb 9.6 oz)  08/17/22 126.3 kg (278 lb 6.4 oz)  07/20/22 130.8 kg (288 lb 6.4 oz)    PHYSICAL EXAM: General: Walked in the clinic.  No respiratory difficulty HEENT: Mallampati Class 4.  Neck: supple. no JVD. Carotids 2+ bilat; no bruits. No lymphadenopathy or thryomegaly appreciated. Cor: PMI nondisplaced. Regular rate & rhythm. No rubs, gallops or murmurs. Lungs: clear On 2 liters  Abdomen: obese, soft, nontender, nondistended. No hepatosplenomegaly. No bruits or masses. Good bowel sounds. Extremities: no cyanosis, clubbing, rash, edema Neuro: alert & oriented x 3, cranial nerves grossly intact. moves all 4 extremities w/o difficulty. Affect pleasant.  ECG: SR 90 bpm personally checked.    ASSESSMENT & PLAN: 1. Chronic HFpEF and RV moderately reduced ( new) -Previous  ECHO 2019 EF 55-60% with normal RV. Most recent Echo 07/2022 EF 55-60% & RV moderately reduced. She had VQ scan April 2023 which was negative. I am concerned she has pulmonary hypertension -->WHO Group III. Now requiring oxygen. Suspected sleep apnea. Will need RHC to further assess and sleep study.  NYHA III, chronically for years.  GDMT  Diuretic-Volume status ok. Continue lasix as needed.  BB-Hold off but can consider as an outpatient. .  Ace/ARB/ARNI- Stop losartan. Start entresto 49-51 mg twice a day  MRA- refill spiro.  SGLT2i- Continue farxiga 10 mg daily  - Check ANA, ANCA, sed rate, RF, SCL-70, CMET, Hep A B C antibodies., CBC, BNP, and anticentromere antibody.   2. HTN -Uncontrolled.  - Continue current medications except will losartan. Start entresto 49-51 mg twice a day.  3. Obesity  Body mass index is 42.21 kg/m. -Discussed weight loss. May need GLPi.   4. DMII -08/12/22 Hgb A1C 9.3 -Needs follow up with PCP.  5. Chronic Hypoxic Respiratory Failure Recently placed on oxygen at discharge during hospital admit. Reamins on  2 liters [place]. O2 sats stable with oxygen but drops < 88% on room air.    6. Suspected sleep apnea  - Stops breathing at night per husband.  Mallampati Class 4.  -Needs sleep study.    Referred to HFSW ( Financial ): Yes  Refer to Pharmacy:  No Refer to Home Health:  No Refer to Advanced Heart Failure Clinic: Yes , Dr Haroldine Laws  Refer to General Cardiology: Shared with Dr Marlou Porch.   Follow up 2 weeks with TOC APP. Set up [place] and sleep study. Needs BMET next week.   Provide letter to remain out of work.   Discussed with Dr Haroldine Laws.  Claudia Clegg NP-C  3:14 PM  Patient seen and examined with the above-signed Advanced Practice Provider and/or Housestaff. I personally reviewed laboratory data, imaging studies and relevant notes. I independently examined the patient and formulated the important aspects of the plan. I have edited the note to reflect  any of my changes or salient points. I have personally discussed the plan with the patient and/or family.  56 y/o morbidly obese woman with HTN, DM2 and HFPEF.   Recently admitted with volume overload. Diuresed and started on SGLT2i.   Referred to [facility] for f/u. Echo with evidence of PAH. NYHA III symptoms. Volume status hard to assess but looks ok. BP markedly elevated  General:  Obese woman. No resp difficulty HEENT: normal Neck: supple. JVP hard to see. Carotids 2+ bilat; no bruits. No lymphadenopathy or thryomegaly appreciated. Cor: PMI nondisplaced. Regular rate & rhythm. No rubs, gallops or murmurs. Lungs: clear Abdomen: obese soft, nontender, nondistended. No hepatosplenomegaly. No bruits or masses. Good bowel sounds. Extremities: no cyanosis, clubbing, rash, edema Neuro: alert & orientedx3, cranial nerves grossly intact. moves all 4 extremities w/o difficulty. Affect pleasant  NYHA III. Agree with Entresto for better BP control. Suspect WHO Group 2 & 3 PH. Will need RHC, serologies and sleep study.   Glori Bickers, MD  8:44 AM

## 2022-08-26 NOTE — Progress Notes (Signed)
Heart and Vascular Care Navigation  08/26/2022  Claudia [name] August 19, 1966 [id number]  Reason for Referral: Patient seen in HF TOC. Patient is participating in a Managed Medicaid Plan: no  Engaged with patient face to face for initial visit for Heart and Vascular Care Coordination.                                                                                                   Assessment: Patient is a 56 yo married female. She has 6 grown children who visit regularly and are supportive. She states she has been working full time although does not receive full time benefits nor any short or long term disability benefits.  Patient is currently on home O2 and unable to work. Her husband works as an Mining engineer and they report they live paycheck to paycheck and have health insurance through [employer]. Patient shares concerns about financial needs in the near future but states at the moment they are caught up on bills and have food.                                  HRT/VAS Care Coordination     Patients Home Cardiology Office Heart Failure Clinic  HF [facility]   Outpatient Care Team Social Worker   Social Worker Name: Raquel Sarna, [place] [phone]   Living arrangements for the past 2 months [place] with: Spouse   Patient [employer]   Patient Has Concern With Paying Medical Bills No   Does Patient Have Prescription Coverage? Yes   Home Assistive Devices/Equipment None   DME [place]       Social History:                                                                             [place]: No Food Insecurity (08/16/2022)  Housing: Low Risk  (08/16/2022)  Transportation Needs: No Transportation Needs (08/16/2022)  Utilities: Not At Risk (08/16/2022)  Alcohol Screen: Low Risk  (08/14/2022)  Depression (PHQ2-9): Low Risk  (02/03/2022)  Financial  Resource Strain: Medium Risk (08/14/2022)  Tobacco Use: Low Risk  (08/26/2022)    SDOH Interventions: Financial Resources:    Fish farm manager for [employer] Insecurity:   N/a  Housing Insecurity:   N/a  Transportation:    N/a    Follow-up plan:  CSW discussed Medicaid and Social Security Disability application process. CSW also discussed some resources available through our Patient Care fund for the future should needs arise. Patient and husband verbalize understanding of follow up and have CSW contact information for future needs. Raquel Sarna, [place], [place]

## 2022-08-27 ENCOUNTER — Other Ambulatory Visit (HOSPITAL_COMMUNITY): Payer: Self-pay | Admitting: *Deleted

## 2022-08-27 DIAGNOSIS — I10 Essential (primary) hypertension: Secondary | ICD-10-CM

## 2022-08-27 DIAGNOSIS — I5033 Acute on chronic diastolic (congestive) heart failure: Secondary | ICD-10-CM

## 2022-08-27 LAB — ANCA TITERS
Atypical P-ANCA titer: <1:20 {titer}
C-ANCA: <1:20 {titer}
P-ANCA: <1:20 {titer}

## 2022-08-27 LAB — CENTROMERE ANTIBODIES: Centromere Ab Screen: <0.2 AI (ref 0.0–0.9)

## 2022-08-27 LAB — HEPATITIS B E ANTIBODY: Hep B E Ab: NEGATIVE

## 2022-08-27 MED ORDER — SODIUM CHLORIDE 0.9% FLUSH: 3.0000 mL | Freq: Two times a day (BID) | INTRAVENOUS | Status: DC

## 2022-08-28 ENCOUNTER — Telehealth (HOSPITAL_COMMUNITY): Payer: Self-pay

## 2022-08-28 ENCOUNTER — Other Ambulatory Visit (HOSPITAL_COMMUNITY): Payer: Self-pay

## 2022-08-28 LAB — RHEUMATOID FACTOR: Rheumatoid fact SerPl-aCnc: <10 IU/mL (ref <14.0)

## 2022-08-28 NOTE — Telephone Encounter (Signed)
Heart Failure Patient Advocate Encounter  Patient was seen in office and recently started on Entresto.  Test billing for this medication returns a copay of $62.72 for 30 day supply. This coverage is eligible for copay assistance card use that would reduce cost to $10 for 30 days.  Clista Bernhardt, CPhT Rx Patient Advocate Phone: [phone]

## 2022-08-29 LAB — ANTINUCLEAR ANTIBODIES, IFA: ANA Ab, IFA: NEGATIVE

## 2022-08-31 ENCOUNTER — Telehealth (HOSPITAL_COMMUNITY): Payer: Self-pay | Admitting: *Deleted

## 2022-08-31 ENCOUNTER — Encounter (HOSPITAL_BASED_OUTPATIENT_CLINIC_OR_DEPARTMENT_OTHER): Payer: Commercial Managed Care - HMO | Admitting: Cardiology

## 2022-08-31 DIAGNOSIS — G4733 Obstructive sleep apnea (adult) (pediatric): Secondary | ICD-10-CM | POA: Diagnosis not present

## 2022-08-31 NOTE — Telephone Encounter (Signed)
[facility] auth request faxed to [employer]   

## 2022-09-01 ENCOUNTER — Telehealth (HOSPITAL_COMMUNITY): Payer: Self-pay

## 2022-09-01 NOTE — Telephone Encounter (Addendum)
  Message left for patient to call office in reference to changing catheterization date.  Spoke to patient date of catheterization changed to December 20,2023 @ 11:30.

## 2022-09-01 NOTE — Procedures (Signed)
SLEEP STUDY REPORT Patient Information Study Date: 08/31/2022 Patient Name: Claudia [name] Patient ID: [id number] Birth Date: 03/13/1966 Age: 56 Gender: Female BMI: 42.1 (W=278 lb, H=5' 8'') Stopbang: 7 Referring Physician: Pierre Bali, MD  TEST DESCRIPTION: Home sleep apnea testing was completed using the WatchPat, a Type 1 device, utilizing peripheral arterial tonometry (PAT), chest movement, actigraphy, pulse oximetry, pulse rate, body position and snore.  AHI was calculated with apnea and hypopnea using valid sleep time as the denominator. RDI includes apneas, hypopneas, and RERAs.  The data acquired and the scoring of sleep and all associated events were performed in accordance with the recommended standards and specifications as outlined in the AASM Manual for the Scoring of Sleep and Associated Events 2.2.0 (2015).  FINDINGS:  1.  Severe Obstructive Sleep Apnea with AHI 60.3/hr.   2.  No Central Sleep Apnea with pAHIc 5.1/hr.  3.  Oxygen desaturations as low as 51%.  4.  Severe snoring was present. O2 sats were < 88% for 171.7 min.  5.  Total sleep time was 7 hrs and 49 min.  6.  9.7% of total sleep time was spent in REM sleep.   7.  Shortened sleep onset latency at 6 min.   8.  Shortened REM sleep onset latency at 43 min.   9.  Total awakenings were 27.  10. Arrhythmia detection: None  DIAGNOSIS:   Severe Obstructive Sleep Apnea (G47.33) Nocturnal Hypoxemia  RECOMMENDATIONS:   1.  Clinical correlation of these findings is necessary.  The decision to treat obstructive sleep apnea (OSA) is usually based on the presence of apnea symptoms or the presence of associated medical conditions such as Hypertension, Congestive Heart Failure, Atrial Fibrillation or Obesity.  The most common symptoms of OSA are snoring, gasping for breath while sleeping, daytime sleepiness and fatigue.   2.  Initiating apnea therapy is recommended given the presence of symptoms and/or associated  conditions. Recommend proceeding with one of the following:     a.  Auto-CPAP therapy with a pressure range of 5-20cm H2O.     b.  An oral appliance (OA) that can be obtained from certain dentists with expertise in sleep medicine.  These are primarily of use in non-obese patients with mild and moderate disease.     c.  An ENT consultation which may be useful to look for specific causes of obstruction and possible treatment options.     d.  If patient is intolerant to PAP therapy, consider referral to ENT for evaluation for hypoglossal nerve stimulator.   3.  Close follow-up is necessary to ensure success with CPAP or oral appliance therapy for maximum benefit.  4.  A follow-up oximetry study on CPAP is recommended to assess the adequacy of therapy and determine the need for supplemental oxygen or the potential need for Bi-level therapy.  An arterial blood gas to determine the adequacy of baseline ventilation and oxygenation should also be considered.  5.  Healthy sleep recommendations include:  adequate nightly sleep (normal 7-9 hrs/night), avoidance of caffeine after noon and alcohol near bedtime, and maintaining a sleep environment that is cool, dark and quiet.  6.  Weight loss for overweight patients is recommended.  Even modest amounts of weight loss can significantly improve the severity of sleep apnea.  7.  Snoring recommendations include:  weight loss where appropriate, side sleeping, and avoidance of alcohol before bed.  8.  Operation of motor vehicle should be avoided when sleepy.  Signature: Fransico Him, MD;  [facility]; [place], Tax adviser of Sleep Medicine Electronically Signed: 09/01/2022 Rev.

## 2022-09-02 ENCOUNTER — Encounter: Payer: Self-pay | Admitting: Nurse Practitioner

## 2022-09-02 ENCOUNTER — Ambulatory Visit (INDEPENDENT_AMBULATORY_CARE_PROVIDER_SITE_OTHER): Payer: Commercial Managed Care - HMO | Admitting: Nurse Practitioner

## 2022-09-02 ENCOUNTER — Ambulatory Visit: Payer: Self-pay | Admitting: Nurse Practitioner

## 2022-09-02 VITALS — BP 135/69 | HR 104 | Temp 98.8°F | Ht 68.0 in | Wt 273.0 lb

## 2022-09-02 DIAGNOSIS — I5033 Acute on chronic diastolic (congestive) heart failure: Secondary | ICD-10-CM

## 2022-09-02 DIAGNOSIS — J9601 Acute respiratory failure with hypoxia: Secondary | ICD-10-CM

## 2022-09-02 DIAGNOSIS — E1169 Type 2 diabetes mellitus with other specified complication: Secondary | ICD-10-CM

## 2022-09-02 DIAGNOSIS — R051 Acute cough: Secondary | ICD-10-CM

## 2022-09-02 DIAGNOSIS — E785 Hyperlipidemia, unspecified: Secondary | ICD-10-CM

## 2022-09-02 MED ORDER — BENZONATATE 100 MG PO CAPS: 100.0000 mg | ORAL_CAPSULE | Freq: Three times a day (TID) | ORAL | 0 refills | Status: DC | PRN

## 2022-09-02 NOTE — Patient Instructions (Addendum)
1. Acute on chronic diastolic CHF (congestive heart failure)  - CBC - Comprehensive metabolic panel   2. Type 2 diabetes mellitus with hyperlipidemia  Continue current medications   Follow up:  Follow up in 3 months

## 2022-09-02 NOTE — Progress Notes (Signed)
_0  ID: Claudia [name], female    DOB: 28-Feb-1966, 56 y.o.   MRN: [id number]  Chief Complaint  Patient presents with   Hospitalization Follow-up   Cough    Pt stated--clear coughing especially when talking or laying flat--1 week. Denied fever. Pt requesting order portable air from [place].    Referring provider: Fenton Foy, NP   HPI  Patient presents today for hospital follow-up.  She was hospitalized for congestive heart failure.  She is scheduled for heart cath coming soon.  She has recently completed a sleep study and is awaiting results.  Patient is currently on 2 L of O2 while ambulating.  Hospital note stated that O2 sats were dropping to 84% while ambulating on room air.  Patient would like a portable air concentrator.  We will try to order this for her today.  Patient is trying to make lifestyle adjustments and is eating healthy. Denies f/c/s, n/v/d, hemoptysis, PND, leg swelling Denies chest pain or edema      Allergies  Allergen Reactions   Beef-Derived Products     Patient does not eat beef   Crestor [Rosuvastatin]     MUSCLE ACHES HEADACHES    Diltiazem Hives   Hydrocodone Itching   Other Hives, Swelling and Other (See Comments)    Vision changes; ALLERGIC TO ALL NUTS EXCEPT PEANUTS   Oxycodone Itching   Pork-Derived Products     For dietary, patient does not eat pork   Pravachol [Pravastatin Sodium] Itching    Joint pain   Simvastatin Hives    Immunization History  Administered Date(s) Administered   Pneumococcal Polysaccharide-23 03/01/2017   Tdap 03/01/2017    Past Medical History:  Diagnosis Date   Diabetes mellitus    Fibroids    Headache(784.0)    History of echocardiogram    Echo 7/18: Moderate concentric LVH, EF 55-60, normal wall motion, grade 1 diastolic dysfunction, calcified aortic leaflets   Hypercholesterolemia    Hyperlipidemia    Hypertension    Osteoarthritis    , with mild meniscus tear, followed by orthopedics--Dr  Gladstone Lighter 04/29/10   Right knee pain    Seizures ([place])    as child   STEMI (ST elevation myocardial infarction) ([place])    Vitamin D deficiency     Tobacco History: Social History   Tobacco Use  Smoking Status Never  Smokeless Tobacco Never   Counseling given: Not Answered   Outpatient Encounter Medications as of 09/02/2022  Medication Sig   acetaminophen (TYLENOL) 500 MG tablet Take 500 mg by mouth every 6 (six) hours as needed for mild pain or moderate pain.   amLODipine (NORVASC) 5 MG tablet Take 2 tablets by mouth once daily   aspirin 81 MG EC tablet Take 1 tablet (81 mg total) by mouth daily.   benzonatate (TESSALON PERLES) 100 MG capsule Take 1 capsule (100 mg total) by mouth 3 (three) times daily as needed for cough.   blood glucose meter kit and supplies KIT Dispense based on patient and insurance preference. Use up to four times daily as directed. (FOR ICD-9 250.00, 250.01).   dapagliflozin propanediol (FARXIGA) 10 MG TABS tablet Take 1 tablet (10 mg total) by mouth daily.   ferrous sulfate 325 (65 FE) MG tablet Take 1 tablet (325 mg total) by mouth daily with breakfast.   furosemide (LASIX) 40 MG tablet Take 1 tablet (40 mg total) by mouth daily as needed for edema or fluid (as needed for leg swelling, shortness of  breath or weight gain 2 to 3 lbs in 24 hrs or 5 lbs in 7 days).   glipiZIDE (GLUCOTROL) 10 MG tablet Take 1 tablet (10 mg total) by mouth 2 (two) times daily before a meal.   hydrALAZINE (APRESOLINE) 25 MG tablet Take 1 tablet (25 mg total) by mouth every 8 (eight) hours.   metFORMIN (GLUCOPHAGE) 1000 MG tablet Take 1 tablet (1,000 mg total) by mouth 2 (two) times daily with a meal.   Omega-3 Fatty Acids (FISH OIL) 1000 MG CAPS Take by mouth.    OVER THE COUNTER MEDICATION Take 1 capsule by mouth daily. Herb Pill -- unknown name   sacubitril-valsartan (ENTRESTO) 49-51 MG Take 1 tablet by mouth 2 (two) times daily.   sacubitril-valsartan (ENTRESTO) 49-51 MG Take 1  tablet by mouth 2 (two) times daily.   spironolactone (ALDACTONE) 25 MG tablet Take 1 tablet (25 mg total) by mouth daily.   No facility-administered encounter medications on file as of 09/02/2022.     Review of Systems  Review of Systems  Constitutional: Negative.   HENT: Negative.    Respiratory:  Positive for cough and shortness of breath.   Cardiovascular: Negative.   Gastrointestinal: Negative.   Allergic/Immunologic: Negative.   Neurological: Negative.   Psychiatric/Behavioral: Negative.         Physical Exam  BP 135/69   Pulse (!) 104   Temp 98.8 F (37.1 C)   Ht _0  (1.727 m)   Wt 273 lb (123.8 kg)   LMP 05/09/2012   SpO2 93%   BMI 41.51 kg/m   Wt Readings from Last 5 Encounters:  09/02/22 273 lb (123.8 kg)  08/26/22 277 lb 9.6 oz (125.9 kg)  08/17/22 278 lb 6.4 oz (126.3 kg)  07/20/22 288 lb 6.4 oz (130.8 kg)  02/03/22 291 lb 6.4 oz (132.2 kg)     Physical Exam Vitals and nursing note reviewed.  Constitutional:      General: She is not in acute distress.    Appearance: She is well-developed.  Cardiovascular:     Rate and Rhythm: Normal rate and regular rhythm.  Pulmonary:     Effort: Pulmonary effort is normal.     Breath sounds: Normal breath sounds.  Neurological:     Mental Status: She is alert and oriented to person, place, and time.      Lab Results:  CBC    Component Value Date/Time   WBC 6.5 09/02/2022 1402   WBC 6.0 08/26/2022 1600   RBC 6.41 (H) 09/02/2022 1402   RBC 6.78 (H) 08/26/2022 1600   HGB 13.2 09/02/2022 1402   HCT 45.5 09/02/2022 1402   PLT 367 09/02/2022 1402   MCV 71 (L) 09/02/2022 1402   MCH 20.6 (L) 09/02/2022 1402   MCH 20.6 (L) 08/26/2022 1600   MCHC 29.0 (L) 09/02/2022 1402   MCHC 28.1 (L) 08/26/2022 1600   RDW 17.3 (H) 09/02/2022 1402   LYMPHSABS 2.0 08/11/2022 1956   LYMPHSABS 2.8 02/03/2022 1425   MONOABS 0.8 08/11/2022 1956   EOSABS 0.1 08/11/2022 1956   EOSABS 0.2 02/03/2022 1425   BASOSABS 0.0  08/11/2022 1956   BASOSABS 0.0 02/03/2022 1425    BMET    Component Value Date/Time   NA 140 09/02/2022 1402   K 3.9 09/02/2022 1402   CL 96 09/02/2022 1402   CO2 32 (H) 09/02/2022 1402   GLUCOSE 78 09/02/2022 1402   GLUCOSE 75 08/26/2022 1600   BUN 6 09/02/2022 1402  CREATININE 0.70 09/02/2022 1402   CREATININE 0.74 07/07/2017 1557   CALCIUM 9.2 09/02/2022 1402   GFRNONAA >60 08/26/2022 1600   GFRNONAA 95 07/07/2017 1557   GFRAA 86 10/04/2020 1607   GFRAA 110 07/07/2017 1557    BNP    Component Value Date/Time   BNP 75.7 08/26/2022 1600   BNP 6.2 02/25/2017 1536    ProBNP    Component Value Date/Time   PROBNP 12 04/12/2017 0839   PROBNP 420.2 (H) 06/01/2012 1648    Imaging: DG Chest 1 View  Result Date: 08/15/2022 CLINICAL DATA:  Shortness of breath. EXAM: CHEST  1 VIEW COMPARISON:  08/11/2022 and prior studies FINDINGS: Cardiomegaly and mild pulmonary vascular congestion noted. There is no evidence of focal airspace disease, pulmonary edema, suspicious pulmonary nodule/mass, pleural effusion, or pneumothorax. No acute bony abnormalities are identified. IMPRESSION: Cardiomegaly with mild pulmonary vascular congestion. Electronically Signed   By: Margarette Canada M.D.   On: 08/15/2022 09:11   ECHOCARDIOGRAM COMPLETE  Result Date: 08/13/2022    ECHOCARDIOGRAM REPORT   Patient Name:   AHMARI GARTON Date of Exam: 08/13/2022 Medical Rec #:  [id number]      Height:       68.0 in Accession #:    [id number]     Weight:       234.0 lb Date of Birth:  [birth year]     BSA:          2.185 m Patient Age:    17 years       BP:           130/75 mmHg Patient Gender: F              HR:           88 bpm. Exam Location:  Inpatient Procedure: 2D Echo, Cardiac Doppler and Color Doppler Indications:    Cardiomyopathy-Unspecified I42.9  History:        Patient has prior history of Echocardiogram examinations, most                 recent 04/25/2018. CHF, Previous Myocardial Infarction,                  Signs/Symptoms:Dyspnea and Chest Pain; Risk                 Factors:Hypertension, Diabetes and Dyslipidemia.                 ACS (acute coronary syndrome) (HCC)                  PSVT (paroxysmal supraventricular tachycardia).  Sonographer:    Ronny Flurry Sonographer#2:  Darlina Sicilian RDCS Referring Phys: [id number] ANKIT CHIRAG AMIN IMPRESSIONS  1. Septal flattening consistent with RV volume / pressure overload. Left ventricular ejection fraction, by estimation, is 55 to 60%. The left ventricle has normal function. The left ventricle has no regional wall motion abnormalities. There is mild left  ventricular hypertrophy.  2. Diffiuclt to see RV endocardium . Right ventricular systolic function is moderately reduced. The right ventricular size is mildly enlarged.  3. The mitral valve is normal in structure. Trivial mitral valve regurgitation.  4. The aortic valve is tricuspid. Aortic valve regurgitation is not visualized. Aortic valve sclerosis is present, with no evidence of aortic valve stenosis.  5. The inferior vena cava is normal in size with greater than 50% respiratory variability, suggesting right atrial pressure of 3 mmHg. FINDINGS  Left Ventricle: Septal flattening consistent with  RV volume / pressure overload. Left ventricular ejection fraction, by estimation, is 55 to 60%. The left ventricle has normal function. The left ventricle has no regional wall motion abnormalities. The left ventricular internal cavity size was normal in size. There is mild left ventricular hypertrophy. Right Ventricle: Diffiuclt to see RV endocardium. The right ventricular size is mildly enlarged. Right vetricular wall thickness was not assessed. Right ventricular systolic function is moderately reduced. Left Atrium: Left atrial size was normal in size. Right Atrium: Right atrial size was normal in size. Pericardium: There is no evidence of pericardial effusion. Mitral Valve: The mitral valve is normal in structure. Trivial  mitral valve regurgitation. Tricuspid Valve: The tricuspid valve is normal in structure. Tricuspid valve regurgitation is mild. Aortic Valve: The aortic valve is tricuspid. Aortic valve regurgitation is not visualized. Aortic valve sclerosis is present, with no evidence of aortic valve stenosis. Aortic valve peak gradient measures 13.8 mmHg. Pulmonic Valve: The pulmonic valve was normal in structure. Pulmonic valve regurgitation is mild. Aorta: The aortic root and ascending aorta are structurally normal, with no evidence of dilitation. Venous: The inferior vena cava is normal in size with greater than 50% respiratory variability, suggesting right atrial pressure of 3 mmHg. IAS/Shunts: No atrial level shunt detected by color flow Doppler.  LEFT VENTRICLE PLAX 2D LVIDd:         4.50 cm   Diastology LVIDs:         3.10 cm   LV e' medial:    4.57 cm/s LV PW:         2.00 cm   LV E/e' medial:  14.4 LV IVS:        1.30 cm   LV e' lateral:   6.64 cm/s LVOT diam:     2.30 cm   LV E/e' lateral: 9.9 LV SV:         94 LV SV Index:   43 LVOT Area:     4.15 cm  RIGHT VENTRICLE             IVC RV Basal diam:  4.50 cm     IVC diam: 1.70 cm RV Mid diam:    3.80 cm RV S prime:     15.60 cm/s TAPSE (M-mode): 2.8 cm LEFT ATRIUM             Index        RIGHT ATRIUM           Index LA Vol (A2C):   34.1 ml 15.61 ml/m  RA Area:     16.40 cm LA Vol (A4C):   58.3 ml 26.68 ml/m  RA Volume:   40.50 ml  18.54 ml/m LA Biplane Vol: 44.6 ml 20.41 ml/m  AORTIC VALVE AV Area (Vmax): 2.64 cm AV Vmax:        186.00 cm/s AV Peak Grad:   13.8 mmHg LVOT Vmax:      118.00 cm/s LVOT Vmean:     90.600 cm/s LVOT VTI:       0.226 m  AORTA Ao Root diam: 3.60 cm Ao Asc diam:  3.20 cm MITRAL VALVE MV Area (PHT): 2.93 cm    SHUNTS MV Decel Time: 259 msec    Systemic VTI:  0.23 m MV E velocity: 66.00 cm/s  Systemic Diam: 2.30 cm MV A velocity: 86.10 cm/s MV E/A ratio:  0.77 Dorris Carnes MD Electronically signed by Dorris Carnes MD Signature Date/Time:  08/13/2022/1:28:55 PM    Final  DG Chest 2 View  Result Date: 08/11/2022 CLINICAL DATA:  SOB, hypoxic EXAM: CHEST - 2 VIEW COMPARISON:  Chest x-ray 01/20/2022, CT angio chest 01/20/2022 FINDINGS: The heart and mediastinal contours are within normal limits. Prominent hilar vasculature. Interval increase in cardiac silhouette. No focal consolidation. Mildly increased interstitial markings. No pleural effusion. No pneumothorax. No acute osseous abnormality. IMPRESSION: 1. Interval increase in cardiac silhouette. Underlying pericardial effusion not fully excluded. 2. Mild pulmonary edema. Electronically Signed   By: Iven Finn M.D.   On: 08/11/2022 21:17     Assessment & Plan:   Acute on chronic diastolic CHF (congestive heart failure) (HCC) - CBC - Comprehensive metabolic panel   2. Type 2 diabetes mellitus with hyperlipidemia  Continue current medications   Follow up:  Follow up in 3 months     Fenton Foy, NP 09/03/2022

## 2022-09-03 ENCOUNTER — Encounter: Payer: Self-pay | Admitting: Nurse Practitioner

## 2022-09-03 LAB — CBC
Hematocrit: 45.5 % (ref 34.0–46.6)
Hemoglobin: 13.2 g/dL (ref 11.1–15.9)
MCH: 20.6 pg — ABNORMAL LOW (ref 26.6–33.0)
MCHC: 29 g/dL — ABNORMAL LOW (ref 31.5–35.7)
MCV: 71 fL — ABNORMAL LOW (ref 79–97)
Platelets: 367 10*3/uL (ref 150–450)
RBC: 6.41 x10E6/uL — ABNORMAL HIGH (ref 3.77–5.28)
RDW: 17.3 % — ABNORMAL HIGH (ref 11.7–15.4)
WBC: 6.5 10*3/uL (ref 3.4–10.8)

## 2022-09-03 LAB — COMPREHENSIVE METABOLIC PANEL
ALT: 26 IU/L (ref 0–32)
AST: 28 IU/L (ref 0–40)
Albumin/Globulin Ratio: 1.5 (ref 1.2–2.2)
Albumin: 4.4 g/dL (ref 3.8–4.9)
Alkaline Phosphatase: 52 IU/L (ref 44–121)
BUN/Creatinine Ratio: 9 (ref 9–23)
BUN: 6 mg/dL (ref 6–24)
Bilirubin Total: 0.3 mg/dL (ref 0.0–1.2)
CO2: 32 mmol/L — ABNORMAL HIGH (ref 20–29)
Calcium: 9.2 mg/dL (ref 8.7–10.2)
Chloride: 96 mmol/L (ref 96–106)
Creatinine, Ser: 0.7 mg/dL (ref 0.57–1.00)
Globulin, Total: 2.9 g/dL (ref 1.5–4.5)
Glucose: 78 mg/dL (ref 70–99)
Potassium: 3.9 mmol/L (ref 3.5–5.2)
Sodium: 140 mmol/L (ref 134–144)
Total Protein: 7.3 g/dL (ref 6.0–8.5)
eGFR: 102 mL/min/{1.73_m2} (ref >59)

## 2022-09-03 NOTE — Assessment & Plan Note (Signed)
-   CBC - Comprehensive metabolic panel   2. Type 2 diabetes mellitus with hyperlipidemia  Continue current medications   Follow up:  Follow up in 3 months

## 2022-09-07 ENCOUNTER — Ambulatory Visit: Payer: Commercial Managed Care - HMO | Attending: Adult Health

## 2022-09-07 DIAGNOSIS — G4733 Obstructive sleep apnea (adult) (pediatric): Secondary | ICD-10-CM

## 2022-09-08 ENCOUNTER — Other Ambulatory Visit: Payer: Self-pay

## 2022-09-08 DIAGNOSIS — G4733 Obstructive sleep apnea (adult) (pediatric): Secondary | ICD-10-CM

## 2022-09-09 ENCOUNTER — Ambulatory Visit (HOSPITAL_COMMUNITY): Payer: Commercial Managed Care - HMO

## 2022-09-10 LAB — MISC LABCORP TEST (SEND OUT): Labcorp test code: 520012

## 2022-09-11 NOTE — Telephone Encounter (Signed)
Pt has viewed recommendations via mychart.

## 2022-09-12 ENCOUNTER — Other Ambulatory Visit: Payer: Self-pay | Admitting: Nurse Practitioner

## 2022-09-12 DIAGNOSIS — I10 Essential (primary) hypertension: Secondary | ICD-10-CM

## 2022-09-16 ENCOUNTER — Encounter (HOSPITAL_COMMUNITY): Payer: Commercial Managed Care - HMO

## 2022-09-16 ENCOUNTER — Ambulatory Visit (HOSPITAL_COMMUNITY)
Admission: RE | Disposition: A | Discharge status: Home / Self Care | Payer: Self-pay | Source: Home / Self Care | Attending: Internal Medicine

## 2022-09-16 ENCOUNTER — Ambulatory Visit (HOSPITAL_COMMUNITY)
Admission: RE | Admit: 2022-09-16 | Discharge: 2022-09-16 | Disposition: A | Discharge status: Home / Self Care | Payer: Commercial Managed Care - HMO | Attending: Internal Medicine | Admitting: Internal Medicine

## 2022-09-16 DIAGNOSIS — I472 Ventricular tachycardia, unspecified: Secondary | ICD-10-CM | POA: Insufficient documentation

## 2022-09-16 DIAGNOSIS — I5033 Acute on chronic diastolic (congestive) heart failure: Secondary | ICD-10-CM

## 2022-09-16 DIAGNOSIS — I5032 Chronic diastolic (congestive) heart failure: Secondary | ICD-10-CM | POA: Diagnosis not present

## 2022-09-16 DIAGNOSIS — Z8249 Family history of ischemic heart disease and other diseases of the circulatory system: Secondary | ICD-10-CM | POA: Insufficient documentation

## 2022-09-16 DIAGNOSIS — I110 Hypertensive heart disease with heart failure: Secondary | ICD-10-CM | POA: Diagnosis not present

## 2022-09-16 DIAGNOSIS — I10 Essential (primary) hypertension: Secondary | ICD-10-CM

## 2022-09-16 DIAGNOSIS — Z5986 Financial insecurity: Secondary | ICD-10-CM | POA: Insufficient documentation

## 2022-09-16 DIAGNOSIS — E119 Type 2 diabetes mellitus without complications: Secondary | ICD-10-CM | POA: Insufficient documentation

## 2022-09-16 DIAGNOSIS — Z6841 Body Mass Index (BMI) 40.0 and over, adult: Secondary | ICD-10-CM | POA: Insufficient documentation

## 2022-09-16 DIAGNOSIS — Z7984 Long term (current) use of oral hypoglycemic drugs: Secondary | ICD-10-CM | POA: Insufficient documentation

## 2022-09-16 DIAGNOSIS — Z833 Family history of diabetes mellitus: Secondary | ICD-10-CM | POA: Insufficient documentation

## 2022-09-16 DIAGNOSIS — I272 Pulmonary hypertension, unspecified: Secondary | ICD-10-CM | POA: Insufficient documentation

## 2022-09-16 HISTORY — PX: RIGHT HEART CATH: CATH118263

## 2022-09-16 LAB — POCT I-STAT EG7
Acid-Base Excess: 5 mmol/L — ABNORMAL HIGH (ref 0.0–2.0)
Acid-Base Excess: 6 mmol/L — ABNORMAL HIGH (ref 0.0–2.0)
Bicarbonate: 32.1 mmol/L — ABNORMAL HIGH (ref 20.0–28.0)
Bicarbonate: 34.2 mmol/L — ABNORMAL HIGH (ref 20.0–28.0)
Calcium, Ion: 0.96 mmol/L — ABNORMAL LOW (ref 1.15–1.40)
Calcium, Ion: 1.19 mmol/L (ref 1.15–1.40)
HCT: 44 % (ref 36.0–46.0)
HCT: 49 % — ABNORMAL HIGH (ref 36.0–46.0)
Hemoglobin: 15 g/dL (ref 12.0–15.0)
Hemoglobin: 16.7 g/dL — ABNORMAL HIGH (ref 12.0–15.0)
O2 Saturation: 70 %
O2 Saturation: 71 %
Potassium: 3.1 mmol/L — ABNORMAL LOW (ref 3.5–5.1)
Potassium: 3.7 mmol/L (ref 3.5–5.1)
Sodium: 143 mmol/L (ref 135–145)
Sodium: 147 mmol/L — ABNORMAL HIGH (ref 135–145)
TCO2: 34 mmol/L — ABNORMAL HIGH (ref 22–32)
TCO2: 36 mmol/L — ABNORMAL HIGH (ref 22–32)
pCO2, Ven: 58.1 mmHg (ref 44–60)
pCO2, Ven: 62.1 mmHg — ABNORMAL HIGH (ref 44–60)
pH, Ven: 7.349 (ref 7.25–7.43)
pH, Ven: 7.35 (ref 7.25–7.43)
pO2, Ven: 40 mmHg (ref 32–45)
pO2, Ven: 40 mmHg (ref 32–45)

## 2022-09-16 LAB — GLUCOSE, CAPILLARY: Glucose-Capillary: 175 mg/dL — ABNORMAL HIGH (ref 70–99)

## 2022-09-16 SURGERY — RIGHT HEART CATH
Anesthesia: LOCAL

## 2022-09-16 MED ORDER — HEPARIN (PORCINE) IN NACL 1000-0.9 UT/500ML-% IV SOLN
INTRAVENOUS | Status: DC | PRN
  Administered 2022-09-16: 500 mL

## 2022-09-16 MED ORDER — SODIUM CHLORIDE 0.9 % IV SOLN: INTRAVENOUS | Status: DC

## 2022-09-16 MED ORDER — ACETAMINOPHEN 325 MG PO TABS: 650.0000 mg | ORAL_TABLET | ORAL | Status: DC | PRN

## 2022-09-16 MED ORDER — MIDAZOLAM HCL 2 MG/2ML IJ SOLN
INTRAMUSCULAR | Status: AC
  Filled 2022-09-16: qty 2

## 2022-09-16 MED ORDER — ONDANSETRON HCL 4 MG/2ML IJ SOLN: 4.0000 mg | Freq: Four times a day (QID) | INTRAMUSCULAR | Status: DC | PRN

## 2022-09-16 MED ORDER — SODIUM CHLORIDE 0.9% FLUSH: 3.0000 mL | INTRAVENOUS | Status: DC | PRN

## 2022-09-16 MED ORDER — LABETALOL HCL 5 MG/ML IV SOLN: 10.0000 mg | INTRAVENOUS | Status: DC | PRN

## 2022-09-16 MED ORDER — SODIUM CHLORIDE 0.9 % IV SOLN: 250.0000 mL | INTRAVENOUS | Status: DC | PRN

## 2022-09-16 MED ORDER — SODIUM CHLORIDE 0.9% FLUSH: 3.0000 mL | Freq: Two times a day (BID) | INTRAVENOUS | Status: DC

## 2022-09-16 MED ORDER — HYDRALAZINE HCL 20 MG/ML IJ SOLN: 10.0000 mg | INTRAMUSCULAR | Status: DC | PRN

## 2022-09-16 MED ORDER — HEPARIN (PORCINE) IN NACL 1000-0.9 UT/500ML-% IV SOLN
INTRAVENOUS | Status: AC
  Filled 2022-09-16: qty 1000

## 2022-09-16 MED ORDER — ASPIRIN 81 MG PO CHEW: 81.0000 mg | CHEWABLE_TABLET | ORAL | Status: DC

## 2022-09-16 MED ORDER — LIDOCAINE HCL (PF) 1 % IJ SOLN
INTRAMUSCULAR | Status: DC | PRN
  Administered 2022-09-16: 2 mL

## 2022-09-16 MED ORDER — FENTANYL CITRATE (PF) 100 MCG/2ML IJ SOLN
INTRAMUSCULAR | Status: AC
  Filled 2022-09-16: qty 2

## 2022-09-16 MED ORDER — LIDOCAINE HCL (PF) 1 % IJ SOLN
INTRAMUSCULAR | Status: AC
  Filled 2022-09-16: qty 30

## 2022-09-16 SURGICAL SUPPLY — 6 items
CATH SWAN GANZ 7F STRAIGHT (CATHETERS) IMPLANT
GLIDESHEATH SLENDER 7FR .021G (SHEATH) IMPLANT
GUIDEWIRE INQWIRE 1.5J.035X260 (WIRE) IMPLANT
INQWIRE 1.5J .035X260CM (WIRE)
PACK CARDIAC CATHETERIZATION (CUSTOM PROCEDURE TRAY) ×1 IMPLANT
TRANSDUCER W/STOPCOCK (MISCELLANEOUS) ×1 IMPLANT

## 2022-09-16 NOTE — Interval H&P Note (Signed)
History and Physical Interval Note:  09/16/2022 2:03 PM  Claudia [name]  has presented today for surgery, with the diagnosis of Right sided heart failure.  The various methods of treatment have been discussed with the patient and family. After consideration of risks, benefits and other options for treatment, the patient has consented to  Procedure(s): RIGHT HEART CATH (N/A) as a surgical intervention.  The patient's history has been reviewed, patient examined, no change in status, stable for surgery.  I have reviewed the patient's chart and labs.  Questions were answered to the patient's satisfaction.     Jentry Warnell

## 2022-09-16 NOTE — Discharge Instructions (Signed)
Brachial Site Care  This sheet gives you information about how to care for yourself after your procedure. Your health care provider may also give you more specific instructions. If you have problems or questions, contact your health care provider. What can I expect after the procedure? After the procedure, it is common to have: Bruising and tenderness at the catheter insertion area. Follow these instructions at home: Medicines Take over-the-counter and prescription medicines only as told by your health care provider. Insertion site care Follow instructions from your health care provider about how to take care of your insertion site. Make sure you: Wash your hands with soap and water before you change your bandage (dressing). If soap and water are not available, use hand sanitizer. Remove your dressing as told by your health care provider. In 24 hours Check your insertion site every day for signs of infection. Check for: Redness, swelling, or pain. Fluid or blood. Pus or a bad smell. Warmth. Do not take baths, swim, or use a hot tub until your health care provider approves. You may shower 24-48 hours after the procedure, or as directed by your health care provider. Remove the dressing and gently wash the site with plain soap and water. Pat the area dry with a clean towel. Do not rub the site. That could cause bleeding. Do not apply powder or lotion to the site. Activity   For 24 hours after the procedure, or as directed by your health care provider: Do not flex or bend the affected arm. Do not push or pull heavy objects with the affected arm. Do not drive yourself home from the hospital or clinic. You may drive 24 hours after the procedure unless your health care provider tells you not to. Do not operate machinery or power tools. Do not lift anything that is heavier than 10 lb (4.5 kg), or the limit that you are told, until your health care provider says that it is safe.  For 2  days Ask your health care provider when it is okay to: Return to work or school. Resume usual physical activities or sports. Resume sexual activity. General instructions If the catheter site starts to bleed, raise your arm and put firm pressure on the site. If the bleeding does not stop, get help right away. This is a medical emergency. If you went home on the same day as your procedure, a responsible adult should be with you for the first 24 hours after you arrive home. Keep all follow-up visits as told by your health care provider. This is important. Contact a health care provider if: You have a fever. You have redness, swelling, or yellow drainage around your insertion site. These symptoms may represent a serious problem that is an emergency. Do not wait to see if the symptoms will go away. Get medical help right away. Call your local emergency services (911 in the [place] Do not drive yourself to the hospital. Summary After the procedure, it is common to have bruising and tenderness at the site. Follow instructions from your health care provider about how to take care of your brachial site wound. Check the wound every day for signs of infection. Do not lift anything that is heavier than 10 lb (4.5 kg), or the limit that you are told, until your health care provider says that it is safe. This information is not intended to replace advice given to you by your health care provider. Make sure you discuss any questions you have with your health  care provider. Document Revised: 10/20/2017 Document Reviewed: 10/20/2017 Elsevier Patient Education  2020 [employer].

## 2022-09-17 ENCOUNTER — Encounter (HOSPITAL_COMMUNITY): Payer: Self-pay | Admitting: Internal Medicine

## 2022-09-17 ENCOUNTER — Other Ambulatory Visit: Payer: Self-pay

## 2022-09-17 MED ORDER — DAPAGLIFLOZIN PROPANEDIOL 10 MG PO TABS: 10.0000 mg | ORAL_TABLET | Freq: Every day | ORAL | 2 refills | Status: DC

## 2022-09-17 MED FILL — Midazolam HCl Inj 5 MG/5ML (Base Equivalent): INTRAMUSCULAR | Qty: 5 | Status: AC

## 2022-09-17 MED FILL — Fentanyl Citrate Preservative Free (PF) Inj 100 MCG/2ML: INTRAMUSCULAR | Qty: 2 | Status: AC

## 2022-09-17 MED FILL — Heparin Sod (Porcine)-NaCl IV Soln 1000 Unit/500ML-0.9%: INTRAVENOUS | Qty: 500 | Status: AC

## 2022-09-17 NOTE — Telephone Encounter (Signed)
Done KH 

## 2022-09-20 ENCOUNTER — Other Ambulatory Visit: Payer: Self-pay | Admitting: Nurse Practitioner

## 2022-09-20 DIAGNOSIS — E1159 Type 2 diabetes mellitus with other circulatory complications: Secondary | ICD-10-CM

## 2022-09-22 ENCOUNTER — Telehealth: Payer: Self-pay | Admitting: *Deleted

## 2022-09-22 DIAGNOSIS — G4733 Obstructive sleep apnea (adult) (pediatric): Secondary | ICD-10-CM

## 2022-09-22 DIAGNOSIS — I10 Essential (primary) hypertension: Secondary | ICD-10-CM

## 2022-09-22 DIAGNOSIS — I272 Pulmonary hypertension, unspecified: Secondary | ICD-10-CM

## 2022-09-22 NOTE — Telephone Encounter (Signed)
-----   Message from Lauralee Evener, [place] sent at 09/02/2022  8:05 AM EST -----  ----- Message ----- From: Sueanne Margarita, MD Sent: 09/01/2022   2:10 PM EST To: Cv Div Sleep Studies  Please let patient know that they have sleep apnea.  Recommend therapeutic CPAP titration ASAP for treatment of patient's sleep disordered breathing.  If unable to perform an in lab titration then initiate ResMed auto CPAP from 4 to 15cm H2O with heated humidity and mask of choice and overnight pulse ox on CPAP.

## 2022-09-22 NOTE — Telephone Encounter (Signed)
The patient has been notified of the result. Left detailed message on voicemail and informed patient to call back..Syniyah Bourne Green, CMA   

## 2022-09-23 NOTE — Telephone Encounter (Signed)
Return Call: The patient has been notified of the result and verbalized understanding.  All questions (if any) were answered. Marolyn Hammock, [place] 09/23/2022 12:43 PM     Pt is aware and agreeable to titration, will precertCPAP titration move to Bipap if needed

## 2022-09-23 NOTE — Addendum Note (Signed)
Addended by: Freada Bergeron on: 09/23/2022 12:53 PM   Modules accepted: Orders

## 2022-09-23 NOTE — Progress Notes (Signed)
Advanced Heart Failure Follow Up.   Referring Physician: Dr Cathlean Sauer  Primary Care:Lazaro Arms NP  Primary Cardiologist: Dr Marlou Porch  HF MD: Dr Vaughan Browner  HPI: Claudia [name] is a 56 year old with a history of HFpEF, NSVT, DMII, IDA, and HTN.   Admitted with April 2023 with CAP. CTA negative for PE. Treated with antibiotics.   Admitted with A/C HFpEF. Initially on oxygen. Diuresed with IV lasix and transitioned to lasix as needed. Placed on SGLT2i. Discharge weight 278 pounds.   She was seen HF TOC 08/26/22 and set for RHC.   Had [facility] on 09/16/22 - Mild mixed pulmonary with mildly elevated left-sided filling pressures.  Today she returns for HF follow up.Overall feeling fine. Denies SOB/PND/Orthopnea. Appetite ok. No fever or chills. Weight at home  pounds. Taking all medications.Works at [employer] in [employer]. Lives with her husband.   Cardiac Testing   RHC 09/16/22  RA = 2 RV = 46/5 PA = 48/30 (37) PCW = 20 Fick cardiac output/index = 7.4/3.2 Thermo CO/CI = 8.7/3.8 PVR = 2.3 WU Ao sat = 93% PA sat = 70%, 71% 1. Mild mixed pulmonary with mildly elevated left-sided filling pressures Continue medical therapy and weight loss efforts. No role for selective pulmonary vasodilators.   Echo 2013: EF 40-45 (in setting of HTN urgency) Echo 7/18: EF 55-60 normal RV  Echo 7/19: EF 55-60 normal RV Echo 08/17/2022: EF 55-60% RV moderately reduced.   Cath 7/19: Normal coronary arteries   Past Medical History:  Diagnosis Date   Diabetes mellitus    Fibroids    Headache(784.0)    History of echocardiogram    Echo 7/18: Moderate concentric LVH, EF 55-60, normal wall motion, grade 1 diastolic dysfunction, calcified aortic leaflets   Hypercholesterolemia    Hyperlipidemia    Hypertension    Osteoarthritis    , with mild meniscus tear, followed by orthopedics--Dr Gladstone Lighter 04/29/10   Right knee pain    Seizures ([place])    as child   STEMI (ST elevation myocardial  infarction) ([place])    Vitamin D deficiency     Current Outpatient Medications  Medication Sig Dispense Refill   acetaminophen (TYLENOL) 500 MG tablet Take 500 mg by mouth every 6 (six) hours as needed for mild pain or moderate pain.     amLODipine (NORVASC) 5 MG tablet Take 2 tablets by mouth once daily 60 tablet 0   aspirin 81 MG EC tablet Take 1 tablet (81 mg total) by mouth daily. 30 tablet 6   benzonatate (TESSALON PERLES) 100 MG capsule Take 1 capsule (100 mg total) by mouth 3 (three) times daily as needed for cough. 20 capsule 0   blood glucose meter kit and supplies KIT Dispense based on patient and insurance preference. Use up to four times daily as directed. (FOR ICD-9 250.00, 250.01). 1 each 0   dapagliflozin propanediol (FARXIGA) 10 MG TABS tablet Take 1 tablet (10 mg total) by mouth daily. 30 tablet 2   ferrous sulfate 325 (65 FE) MG tablet Take 1 tablet (325 mg total) by mouth daily with breakfast. 60 tablet 6   furosemide (LASIX) 40 MG tablet Take 1 tablet (40 mg total) by mouth daily as needed for edema or fluid (as needed for leg swelling, shortness of breath or weight gain 2 to 3 lbs in 24 hrs or 5 lbs in 7 days). 30 tablet 0   glipiZIDE (GLUCOTROL) 10 MG tablet Take 1 tablet (10 mg total)  by mouth 2 (two) times daily before a meal. 180 tablet 3   hydrALAZINE (APRESOLINE) 25 MG tablet Take 1 tablet (25 mg total) by mouth every 8 (eight) hours. 270 each 3   metFORMIN (GLUCOPHAGE) 1000 MG tablet TAKE 1 TABLET BY MOUTH TWICE DAILY WITH A MEAL 180 tablet 1   Omega-3 Fatty Acids (FISH OIL) 1000 MG CAPS Take 1,000 mg by mouth daily.     OVER THE COUNTER MEDICATION Take 1 capsule by mouth daily. Herbal Supplement  -- unknown name     sacubitril-valsartan (ENTRESTO) 49-51 MG Take 1 tablet by mouth 2 (two) times daily. 60 tablet 6   sacubitril-valsartan (ENTRESTO) 49-51 MG Take 1 tablet by mouth 2 (two) times daily.     spironolactone (ALDACTONE) 25 MG tablet Take 1 tablet (25 mg total)  by mouth daily. 90 tablet 3   No current facility-administered medications for this visit.    Allergies  Allergen Reactions   Beef-Derived Products     Patient does not eat beef   Crestor [Rosuvastatin]     MUSCLE ACHES HEADACHES    Diltiazem Hives   Hydrocodone Itching   Other Hives, Swelling and Other (See Comments)    Vision changes; ALLERGIC TO ALL NUTS EXCEPT PEANUTS   Oxycodone Itching   Pork-Derived Products     For dietary, patient does not eat pork   Pravachol [Pravastatin Sodium] Itching    Joint pain   Simvastatin Hives      Social History   Socioeconomic History   Marital status: Married    Spouse name: Doren Custard   Number of children: 6   Years of education: Not on file   Highest education level: High school graduate  Occupational History   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   Smoking status: Never   Smokeless tobacco: Never  Vaping Use   Vaping Use: Never used  Substance and Sexual Activity   Alcohol use: No   Drug use: No   Sexual activity: Yes  Other Topics Concern   Not on file  Social History Narrative   Not on file   Social Determinants of Health   Financial Resource Strain: Medium Risk (08/14/2022)   Overall Financial Resource Strain (CARDIA)    Difficulty of Paying Living Expenses: Somewhat hard  Food Insecurity: No Food Insecurity (08/16/2022)   Hunger Vital Sign    Worried About Running Out of Food in the Last Year: Never true    Ran Out of Food in the Last Year: Never true  Transportation Needs: No Transportation Needs (08/16/2022)   PRAPARE - Hydrologist (Medical): No    Lack of Transportation (Non-Medical): No  Physical Activity: Not on file  Stress: Not on file  Social Connections: Not on file  Intimate Partner Violence: Not At Risk (08/16/2022)   Humiliation, Afraid, Rape, and Kick questionnaire    Fear of Current or Ex-Partner: No    Emotionally Abused: No    Physically Abused:  No    Sexually Abused: No      Family History  Problem Relation Age of Onset   Hypertension Mother    Diabetes Mother    Hypercholesterolemia Mother    Hypertension Sister    CVA Maternal Grandmother    Hypertension Maternal Grandmother    Diabetes Maternal Grandmother    Heart attack Neg Hx     There were no vitals filed for this visit.  Wt Readings from Last 3 Encounters:  09/16/22 118.4 kg (261 lb)  09/02/22 123.8 kg (273 lb)  08/26/22 125.9 kg (277 lb 9.6 oz)    PHYSICAL EXAM: General: Walked in the clinic.  No respiratory difficulty HEENT: Mallampati Class 4.  Neck: supple. no JVD. Carotids 2+ bilat; no bruits. No lymphadenopathy or thryomegaly appreciated. Cor: PMI nondisplaced. Regular rate & rhythm. No rubs, gallops or murmurs. Lungs: clear On 2 liters  Abdomen: obese, soft, nontender, nondistended. No hepatosplenomegaly. No bruits or masses. Good bowel sounds. Extremities: no cyanosis, clubbing, rash, edema Neuro: alert & oriented x 3, cranial nerves grossly intact. moves all 4 extremities w/o difficulty. Affect pleasant.  ECG: SR 90 bpm personally checked.    ASSESSMENT & PLAN: 1. Chronic HFpEF and RV moderately reduced ( new) -Previous ECHO 2019 EF 55-60% with normal RV. Most recent Echo 07/2022 EF 55-60% & RV moderately reduced. She had VQ scan April 2023 which was negative. I am concerned she has pulmonary hypertension -->WHO Group III. Now requiring oxygen. Suspected sleep apnea. Will need RHC to further assess and sleep study.  NYHA III, chronically for years.  GDMT  Diuretic-Volume status ok. Continue lasix as needed.  BB-Hold off but can consider as an outpatient. .  Ace/ARB/ARNI- Stop losartan. Start entresto 49-51 mg twice a day  MRA- refill spiro.  SGLT2i- Continue farxiga 10 mg daily  - Check ANA, ANCA, sed rate, RF, SCL-70, CMET, Hep A B C antibodies., CBC, BNP, and anticentromere antibody.   2. HTN -Uncontrolled.  - Continue current  medications except will losartan. Start entresto 49-51 mg twice a day.  3. Obesity  There is no height or weight on file to calculate BMI. -Discussed weight loss. May need GLPi.   4. DMII -08/12/22 Hgb A1C 9.3 -Needs follow up with PCP.  5. Chronic Hypoxic Respiratory Failure Recently placed on oxygen at discharge during hospital admit. Reamins on  2 liters [place]. O2 sats stable with oxygen but drops < 88% on room air.    6. Suspected sleep apnea  - Stops breathing at night per husband.  Mallampati Class 4.  -Needs sleep study.    Claudia Sytsma NP-C  4:44 PM

## 2022-09-24 ENCOUNTER — Telehealth: Payer: Self-pay | Admitting: Pharmacist

## 2022-09-24 ENCOUNTER — Ambulatory Visit (HOSPITAL_COMMUNITY)
Admission: RE | Admit: 2022-09-24 | Discharge: 2022-09-24 | Disposition: A | Discharge status: Home / Self Care | Payer: Commercial Managed Care - HMO | Source: Ambulatory Visit | Attending: Adult Health | Admitting: Adult Health

## 2022-09-24 ENCOUNTER — Encounter (HOSPITAL_COMMUNITY): Payer: Self-pay

## 2022-09-24 VITALS — BP 110/70 | HR 90 | Wt 267.0 lb

## 2022-09-24 DIAGNOSIS — Z7982 Long term (current) use of aspirin: Secondary | ICD-10-CM | POA: Diagnosis not present

## 2022-09-24 DIAGNOSIS — G4733 Obstructive sleep apnea (adult) (pediatric): Secondary | ICD-10-CM | POA: Insufficient documentation

## 2022-09-24 DIAGNOSIS — E119 Type 2 diabetes mellitus without complications: Secondary | ICD-10-CM | POA: Diagnosis not present

## 2022-09-24 DIAGNOSIS — Z7985 Long-term (current) use of injectable non-insulin antidiabetic drugs: Secondary | ICD-10-CM | POA: Diagnosis not present

## 2022-09-24 DIAGNOSIS — I4510 Unspecified right bundle-branch block: Secondary | ICD-10-CM | POA: Insufficient documentation

## 2022-09-24 DIAGNOSIS — Z7984 Long term (current) use of oral hypoglycemic drugs: Secondary | ICD-10-CM | POA: Insufficient documentation

## 2022-09-24 DIAGNOSIS — E669 Obesity, unspecified: Secondary | ICD-10-CM | POA: Diagnosis not present

## 2022-09-24 DIAGNOSIS — I491 Atrial premature depolarization: Secondary | ICD-10-CM | POA: Insufficient documentation

## 2022-09-24 DIAGNOSIS — I110 Hypertensive heart disease with heart failure: Secondary | ICD-10-CM | POA: Insufficient documentation

## 2022-09-24 DIAGNOSIS — I252 Old myocardial infarction: Secondary | ICD-10-CM | POA: Insufficient documentation

## 2022-09-24 DIAGNOSIS — J9611 Chronic respiratory failure with hypoxia: Secondary | ICD-10-CM | POA: Diagnosis not present

## 2022-09-24 DIAGNOSIS — I5032 Chronic diastolic (congestive) heart failure: Secondary | ICD-10-CM | POA: Diagnosis present

## 2022-09-24 DIAGNOSIS — I50810 Right heart failure, unspecified: Secondary | ICD-10-CM | POA: Insufficient documentation

## 2022-09-24 DIAGNOSIS — Z79899 Other long term (current) drug therapy: Secondary | ICD-10-CM | POA: Insufficient documentation

## 2022-09-24 DIAGNOSIS — Z6841 Body Mass Index (BMI) 40.0 and over, adult: Secondary | ICD-10-CM | POA: Insufficient documentation

## 2022-09-24 DIAGNOSIS — R000 Tachycardia, unspecified: Secondary | ICD-10-CM | POA: Insufficient documentation

## 2022-09-24 DIAGNOSIS — R0609 Other forms of dyspnea: Secondary | ICD-10-CM

## 2022-09-24 DIAGNOSIS — I10 Essential (primary) hypertension: Secondary | ICD-10-CM

## 2022-09-24 NOTE — Telephone Encounter (Signed)
Pt with obesity and uncontrolled DM, A1c ranging 9-14.5% over the past year.   Tried submitting prior auth for [place] and received message that [employer] and Trulicity are preferred. Will try submitting request anyway as Byetta/Bydureon are not preferred due to lack of CV benefit and frequency/difficulty of injections. Trulicity with much less weight loss than Mounjaro.  Will call pt to discuss once insurance coverage is known.

## 2022-09-24 NOTE — Telephone Encounter (Addendum)
Mounjaro prior auth denied since pt has not tried [employer]. Ozempic not on formulary either. Will submit request for Trulicity since injections are easier than Byetta/Bydureon and it has CV benefit although less weight loss at around 5% compared to other GLPs her insurance won't cover.  Trulicity available on formulary without prior auth. Called pt and left message to discuss.

## 2022-09-24 NOTE — Telephone Encounter (Signed)
-----   Message from Clare, [place] sent at 09/24/2022  9:57 AM EST ----- Good morning we would like to refer this patient for ozempic

## 2022-09-24 NOTE — Patient Instructions (Addendum)
EKG done today.  No Labs done today.   No medication changes were made. Please continue all current medications as prescribed.  You have been referred to [facility]. They will contact you to schedule an appointment.   Your physician recommends that you schedule a follow-up appointment in: 3 months with Dr. Haroldine Laws  If you have any questions or concerns before your next appointment please send [place] a message through [facility] or call our office at [phone].    TO LEAVE A MESSAGE FOR THE NURSE SELECT OPTION 2, PLEASE LEAVE A MESSAGE INCLUDING: YOUR NAME DATE OF BIRTH CALL BACK NUMBER REASON FOR CALL**this is important as we prioritize the call backs  YOU WILL RECEIVE A CALL BACK THE SAME DAY AS LONG AS YOU CALL BEFORE 4:00 PM   Do the following things EVERYDAY: Weigh yourself in the morning before breakfast. Write it down and keep it in a log. Take your medicines as prescribed Eat low salt foods--Limit salt (sodium) to 2000 mg per day.  Stay as active as you can everyday Limit all fluids for the day to less than 2 liters   At the [facility], you and your health needs are our priority. As part of our continuing mission to provide you with exceptional heart care, we have created designated Provider Care Teams. These Care Teams include your primary Cardiologist (physician) and Advanced Practice Providers (APPs- Physician Assistants and Nurse Practitioners) who all work together to provide you with the care you need, when you need it.   You may see any of the following providers on your designated Care Team at your next follow up: Dr Glori Bickers Dr Haynes Kerns, NP Lyda Jester, [place] Audry Riles, PharmD   Please be sure to bring in all your medications bottles to every appointment.

## 2022-09-25 NOTE — Telephone Encounter (Signed)
Pt returned call to clinic. Reports her insurance is changing from [place] to [place] on January 1. Will hold off on GLP until then and I'll reach out to her new insurance after the new year to see if they'll cover Mounjaro due to superior weight loss seen with it compared to Trulicity.

## 2022-09-28 DIAGNOSIS — Z419 Encounter for procedure for purposes other than remedying health state, unspecified: Secondary | ICD-10-CM | POA: Diagnosis not present

## 2022-09-29 NOTE — Telephone Encounter (Addendum)
Mounjaro PA submitted with pt's new [employer]. Key: [id number]. Request approved immediately through 09/28/23. Will call pt to discuss in the AM.

## 2022-10-01 ENCOUNTER — Other Ambulatory Visit (HOSPITAL_BASED_OUTPATIENT_CLINIC_OR_DEPARTMENT_OTHER): Payer: Self-pay

## 2022-10-01 NOTE — Telephone Encounter (Signed)
Called pt again and left another message.

## 2022-10-01 NOTE — Telephone Encounter (Signed)
Pt returning call to Pharmacist. Please advise

## 2022-10-01 NOTE — Telephone Encounter (Signed)
Patient is retuning Megan's call.  Informed her about the PA approval. Due to supply shortage on starting dose Mounjaro got patient to check availability of 2.5 mg and 5 mg dose with her pharmacy. Patient reports her pharmacy can get 5 mg dose but not 2.5 mg.  [facility] has Mounjaro 2.5 mg dose available so will send prescription there.  Patient has been informed,Patient prefers in person visit to learn all about Mounjaro. Patient to inform us when she gets [facility] from the pharmacy so we can schedule OV.

## 2022-10-02 ENCOUNTER — Other Ambulatory Visit (HOSPITAL_COMMUNITY): Payer: Self-pay

## 2022-10-02 MED ORDER — MOUNJARO 2.5 MG/0.5ML ~~LOC~~ SOAJ: 2.5000 mg | SUBCUTANEOUS | 0 refills | Status: DC

## 2022-10-02 MED ORDER — OZEMPIC (0.25 OR 0.5 MG/DOSE) 2 MG/3ML ~~LOC~~ SOPN: PEN_INJECTOR | SUBCUTANEOUS | 1 refills | Status: DC

## 2022-10-02 NOTE — Telephone Encounter (Addendum)
Mounjaro copay cost prohibitive even with insurance at almost $350. Pharmacy can't run a separate copay card, pt being told it's already being run with her insurance. Will submit prior auth for Ozempic instead to see if cheaper.

## 2022-10-02 NOTE — Telephone Encounter (Signed)
Spoke with pt. Scheduled PharmD appt for next Wednesday, Claudia [name] rx sent to pharmacy and pt advised to look for copay card online. She'll store med in [place] and bring to appt if she's able to have it filled by her appt time.

## 2022-10-02 NOTE — Telephone Encounter (Addendum)
Ozempic prior auth approved through 10/01/23. Rx sent to pharmacy, pt will call and see if more affordable than the [facility]. Should also be able to use $25 copay card but looks like she may have high deductible plan.   If unaffordable, will need to cancel her PharmD appt for next week.

## 2022-10-02 NOTE — Addendum Note (Signed)
Addended by: Nicolena Schurman E on: 10/02/2022 04:11 PM   Modules accepted: Orders

## 2022-10-02 NOTE — Telephone Encounter (Signed)
Pt left message for me, I called pt back and have left another message. Don't see that rx was sent in yesterday.

## 2022-10-05 NOTE — Addendum Note (Signed)
Addended by: Myla Mauriello E on: 10/05/2022 02:23 PM   Modules accepted: Orders

## 2022-10-05 NOTE — Telephone Encounter (Addendum)
Pt reports Ozempic copay also ~$400 even with insurance and copay card. Sounds like she has a high deductible plan unfortunately. Appt with PharmD canceled for this week.  Stated she would try to pick up [facility] next month as copay was a little cheaper at the pharmacy and she really wishes to start on medication despite the cost. She will call me if/when she picks up rx so that appt with PharmD can be rescheduled.

## 2022-10-07 ENCOUNTER — Ambulatory Visit: Payer: Commercial Managed Care - HMO

## 2022-10-14 ENCOUNTER — Other Ambulatory Visit: Payer: Self-pay

## 2022-10-15 ENCOUNTER — Telehealth (HOSPITAL_COMMUNITY): Payer: Self-pay

## 2022-10-15 ENCOUNTER — Other Ambulatory Visit (HOSPITAL_COMMUNITY): Payer: Self-pay

## 2022-10-15 NOTE — Telephone Encounter (Signed)
Pt called and left v/m and is wanting a nurse to call her with some concerns

## 2022-10-15 NOTE — Telephone Encounter (Addendum)
Advanced Heart Failure Patient Advocate Encounter  Prior authorization is required for Entresto. PA submitted and APPROVED on 10/16/22. Test claim returns $4 copay.  Key [id number] Effective: 10/16/22 - 10/15/22  Clista Bernhardt, CPhT Rx Patient Advocate Phone: [phone]

## 2022-10-15 NOTE — Telephone Encounter (Signed)
Left message for patient to return call to office at [phone].

## 2022-10-16 ENCOUNTER — Other Ambulatory Visit (HOSPITAL_COMMUNITY): Payer: Self-pay

## 2022-10-16 ENCOUNTER — Encounter: Payer: Self-pay | Admitting: Pharmacist

## 2022-10-16 ENCOUNTER — Telehealth: Payer: Self-pay | Admitting: Pharmacist

## 2022-10-16 IMAGING — MG DIGITAL SCREENING BILAT W/ CAD
8 series · 8 of 8 positions shown · non-contrast
Comparison: Previous exam(s).

CLINICAL DATA: Screening.

EXAM:
DIGITAL SCREENING BILATERAL MAMMOGRAM WITH CAD
TECHNIQUE: Bilateral screening digital craniocaudal and mediolateral oblique
mammograms were obtained. The images were evaluated with
computer-aided detection.

[L MLO (1 of 2)]
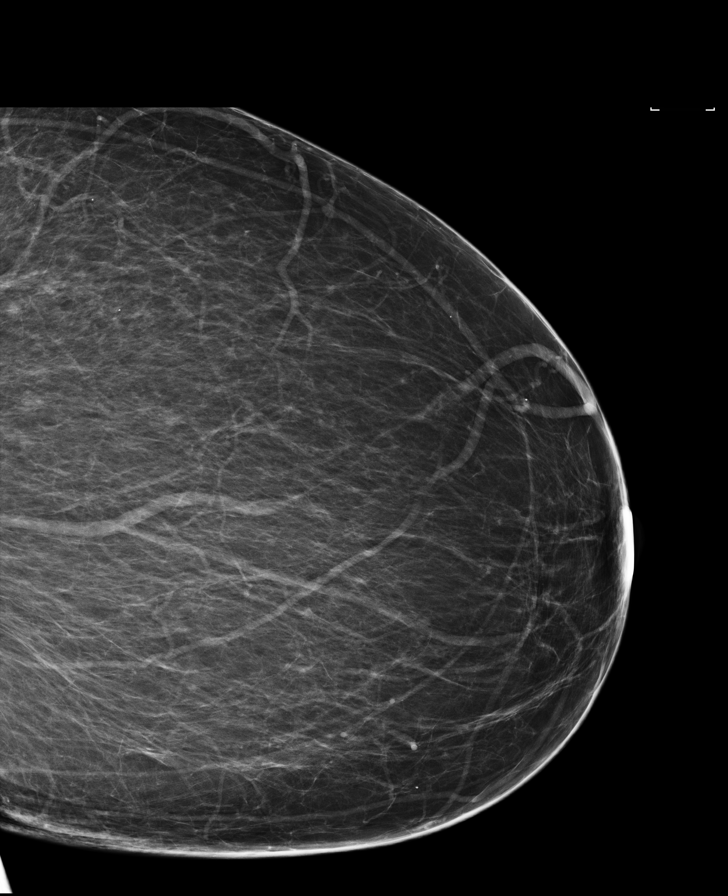

[L CC (1 of 2)]
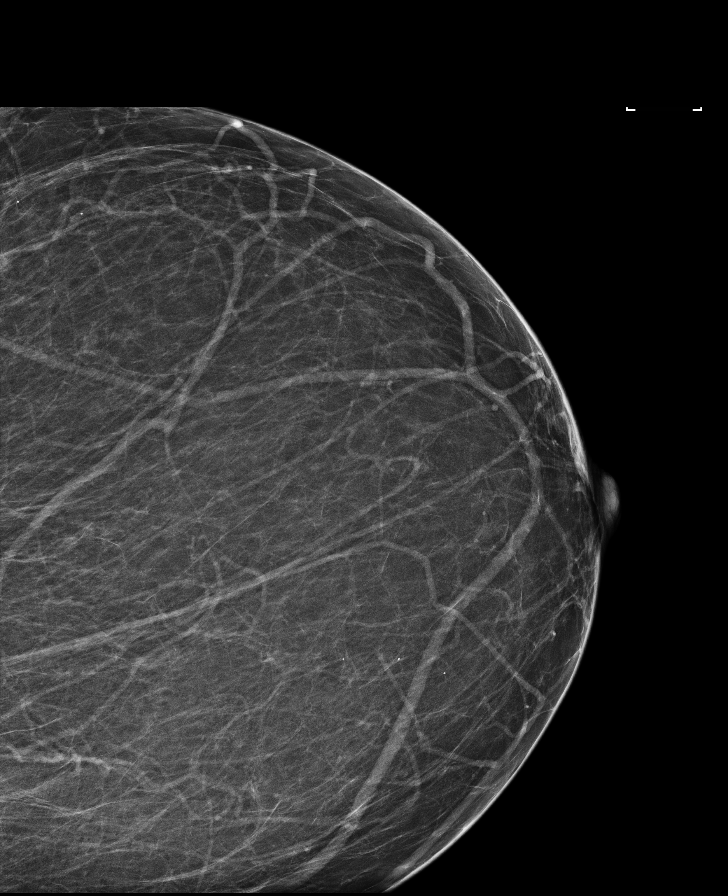

[R CV]
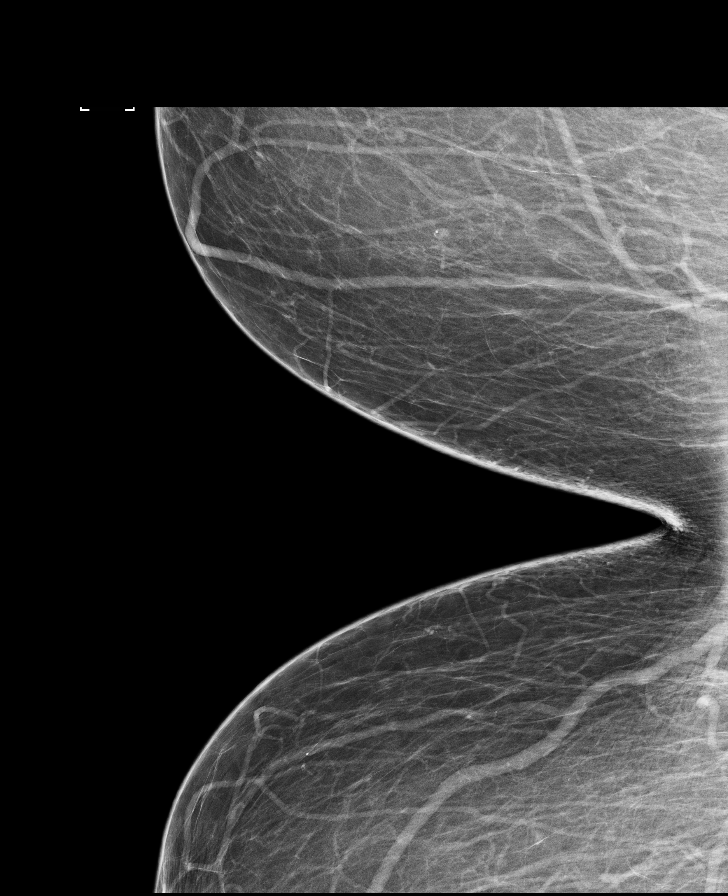

[R MLO (1 of 2)]
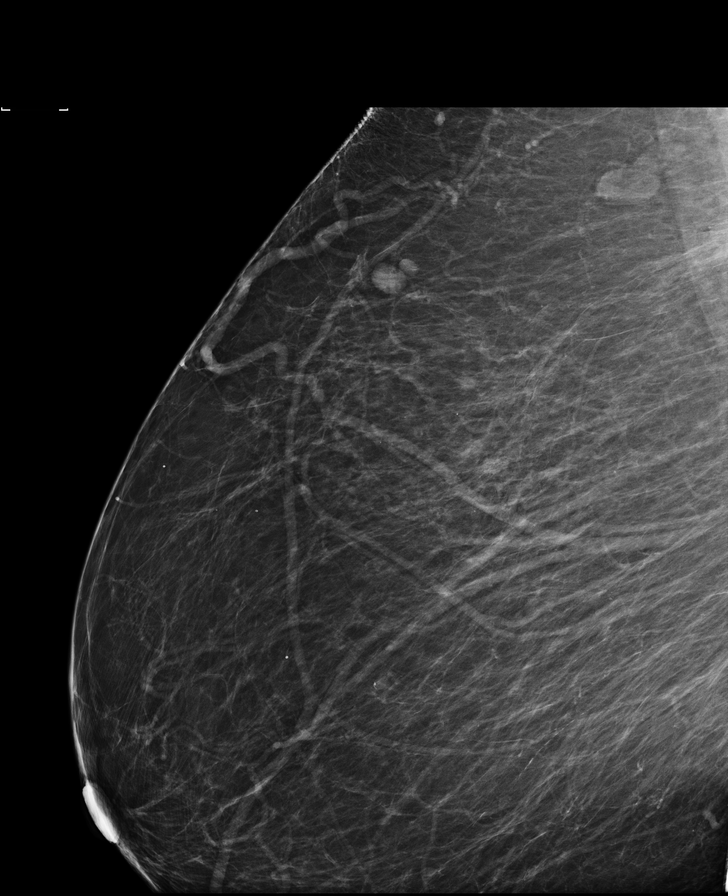

[R MLO (2 of 2)]
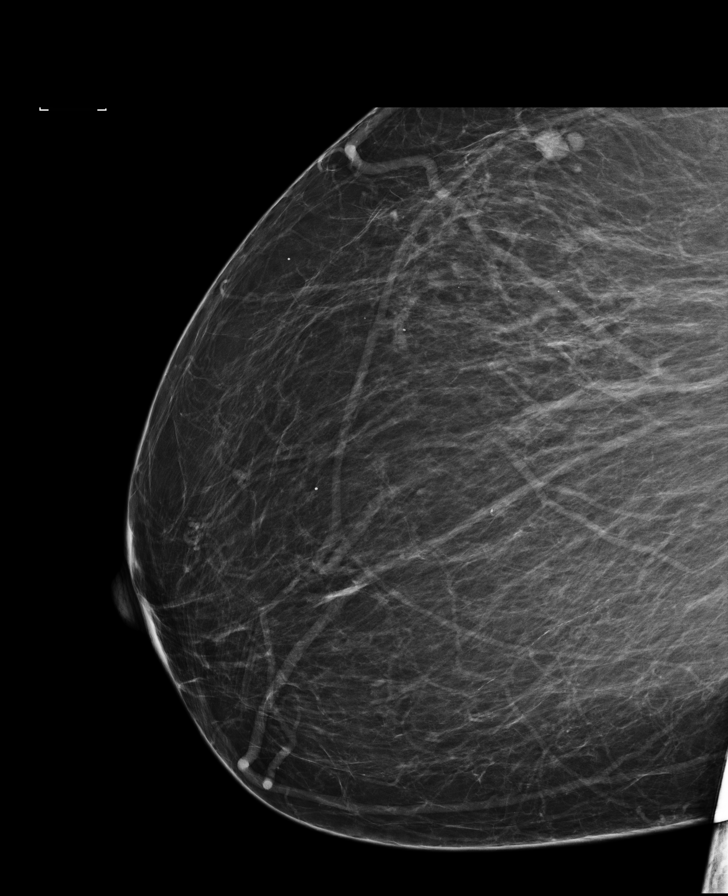

[L MLO (2 of 2)]
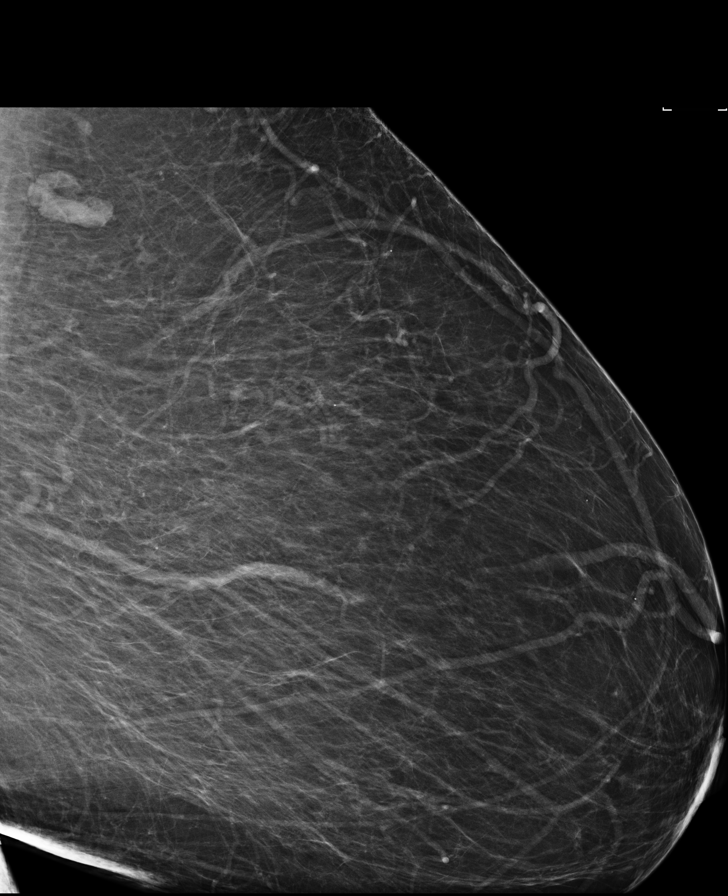

[L CC (2 of 2)]
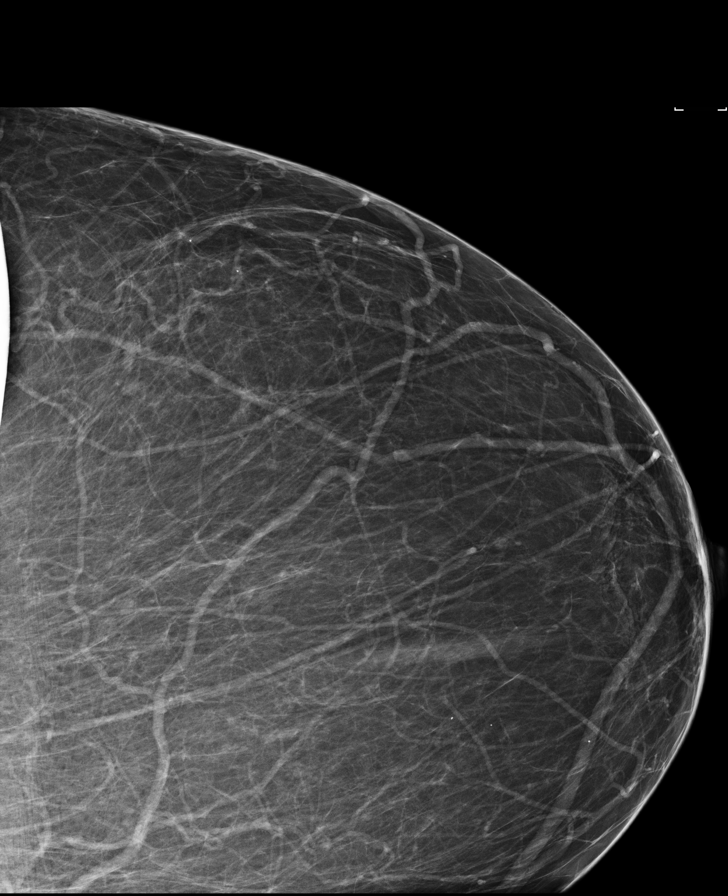

[R CC]
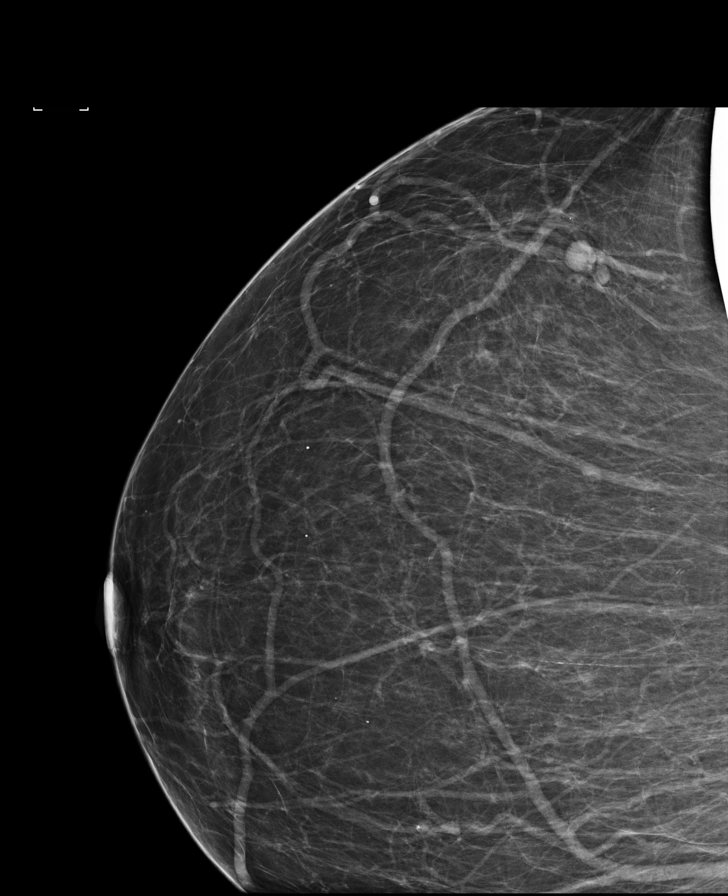

[8 of 8 positions shown; findings below may reference images not displayed]

ACR Breast Density Category b: There are scattered areas of
fibroglandular density.
FINDINGS: There are no findings suspicious for malignancy.
IMPRESSION: No mammographic evidence of malignancy. A result letter of this
screening mammogram will be mailed directly to the patient.

RECOMMENDATION:
Screening mammogram in one year. (Code:[id number])

BI-RADS CATEGORY  1: Negative.

## 2022-10-16 MED ORDER — SEMAGLUTIDE(0.25 OR 0.5MG/DOS) 2 MG/3ML ~~LOC~~ SOPN: PEN_INJECTOR | SUBCUTANEOUS | 1 refills | Status: DC

## 2022-10-16 NOTE — Telephone Encounter (Addendum)
Ozempic has been approved through 10/16/23 Rx sent to [facility].

## 2022-10-16 NOTE — Telephone Encounter (Addendum)
Prior authorization was ran for this patient and was approved through [place] a (dme) to get her cpap but not for a [place] facility to have the sleep study done. Patient is on her husbands policy which is [employer] planner and they are out of network with [employer]. [employer] approved the sleep study but this insurance is secondary to the [employer] and [employer] which may still result in getting a denial for payment if done at a [facility] facility.

## 2022-10-16 NOTE — Telephone Encounter (Signed)
Patient now has insurance through [facility]. Wants to know if she can get the sleep study through them it it would be approved.  ID # is [id number]

## 2022-10-16 NOTE — Telephone Encounter (Signed)
Patient now has [employer]. Darcel Bayley is not covered (non-preferred) but Ozempic should be. PA for ozmepic submitted Key: [id number] Patient scheduled for next Friday in office

## 2022-10-16 NOTE — Telephone Encounter (Signed)
Pt is returning call and is requesting return call.

## 2022-10-19 NOTE — Telephone Encounter (Signed)
Pt called PharmD line about this, advised pt that Gae Bon will give her a call back about her sleep study.

## 2022-10-23 ENCOUNTER — Ambulatory Visit: Payer: Medicaid Other | Attending: Cardiology | Admitting: Pharmacist

## 2022-10-23 ENCOUNTER — Other Ambulatory Visit: Payer: Self-pay | Admitting: Nurse Practitioner

## 2022-10-23 ENCOUNTER — Other Ambulatory Visit: Payer: Self-pay

## 2022-10-23 VITALS — Wt 271.0 lb

## 2022-10-23 DIAGNOSIS — E1169 Type 2 diabetes mellitus with other specified complication: Secondary | ICD-10-CM | POA: Diagnosis not present

## 2022-10-23 DIAGNOSIS — E785 Hyperlipidemia, unspecified: Secondary | ICD-10-CM | POA: Diagnosis not present

## 2022-10-23 DIAGNOSIS — E669 Obesity, unspecified: Secondary | ICD-10-CM | POA: Diagnosis not present

## 2022-10-23 MED ORDER — FUROSEMIDE 40 MG PO TABS: 40.0000 mg | ORAL_TABLET | Freq: Every day | ORAL | 1 refills | Status: DC | PRN

## 2022-10-23 MED ORDER — ONETOUCH VERIO VI STRP
1.0000 | ORAL_STRIP | Freq: Four times a day (QID) | 3 refills | Status: DC
  Filled 2022-10-23: qty 100, 25d supply, fill #0
  Filled 2022-10-26: qty 300, 75d supply, fill #0

## 2022-10-23 NOTE — Progress Notes (Unsigned)
Patient ID: Claudia [name]                 DOB: 28-Dec-1965                    MRN: [id number]     HPI: Claudia [name] is a 57 y.o. female patient of Dr Haroldine Laws referred to pharmacy clinic by Darrick Grinder, NP to initiate weight loss therapy with GLP1-RA. PMH is significant for obesity, HFpEF, T2DM, HTN, CAD s/p STEMI, HLD, and osteoarthritis. Most recent BMI 41.  New Medicaid insurance this year which covers Ozempic, prior auth approved through 10/16/23. Most recent A1c uncontrolled at 9.3% Previously as high as 14.5% in 2022. Currently taking Farxiga '10mg'$  daily (prescribed by cardiologist for her HFpEF), metformin 1g BID, and glipizide '10mg'$  BID.  Pt presents today with her husband. Checks fasting glucose at home, recalls readings of 89, 100, 150, 140, 79, low of 60 and high of 189. Needs her Lasix refilled. Has upcoming sleep study.  DM meds: metformin 1g BID, glipizide '10mg'$  BID, Farxiga '10mg'$  daily (also with HFpEF)  Diet:  Breakfast - oatmeal or cereal Dinner - baked chicken or fish, broccoli, asparagus, salads Snacks - ginger snaps or graham crackers Drinks - sugar free sweetener added to water, Coke, decaf coffee  Exercise: not much now, wants to build up to some walking  Family History: Mother with HTN, HLD, and DM. Sister with HTN. Maternal grandmother with CVA, HTN, and DM.  Social History: No tobacco, alcohol or drug use.  Labs: Lab Results  Component Value Date   HGBA1C 9.3 (H) 08/12/2022    Wt Readings from Last 1 Encounters:  09/24/22 267 lb (121.1 kg)    BP Readings from Last 1 Encounters:  09/24/22 110/70   Pulse Readings from Last 1 Encounters:  09/24/22 90       Component Value Date/Time   CHOL 219 (H) 08/25/2021 0943   TRIG 96 08/25/2021 0943   HDL 57 08/25/2021 0943   CHOLHDL 3.8 08/25/2021 0943   CHOLHDL 3.8 02/25/2017 1536   VLDL 25 02/25/2017 1536   LDLCALC 145 (H) 08/25/2021 0943    Past Medical History:  Diagnosis Date   Diabetes mellitus     Fibroids    Headache(784.0)    History of echocardiogram    Echo 7/18: Moderate concentric LVH, EF 55-60, normal wall motion, grade 1 diastolic dysfunction, calcified aortic leaflets   Hypercholesterolemia    Hyperlipidemia    Hypertension    Osteoarthritis    , with mild meniscus tear, followed by orthopedics--Dr Gladstone Lighter 04/29/10   Right knee pain    Seizures ([place])    as child   STEMI (ST elevation myocardial infarction) ([place])    Vitamin D deficiency     Current Outpatient Medications on File Prior to Visit  Medication Sig Dispense Refill   acetaminophen (TYLENOL) 500 MG tablet Take 500 mg by mouth every 6 (six) hours as needed for mild pain or moderate pain.     amLODipine (NORVASC) 5 MG tablet Take 2 tablets by mouth once daily 60 tablet 0   aspirin 81 MG EC tablet Take 1 tablet (81 mg total) by mouth daily. 30 tablet 6   benzonatate (TESSALON PERLES) 100 MG capsule Take 1 capsule (100 mg total) by mouth 3 (three) times daily as needed for cough. 20 capsule 0   blood glucose meter kit and supplies KIT Dispense based on patient and insurance preference. Use up to four  times daily as directed. (FOR ICD-9 250.00, 250.01). 1 each 0   dapagliflozin propanediol (FARXIGA) 10 MG TABS tablet Take 1 tablet (10 mg total) by mouth daily. 30 tablet 2   ferrous sulfate 325 (65 FE) MG tablet Take 1 tablet (325 mg total) by mouth daily with breakfast. 60 tablet 6   furosemide (LASIX) 40 MG tablet Take 1 tablet (40 mg total) by mouth daily as needed for edema or fluid (as needed for leg swelling, shortness of breath or weight gain 2 to 3 lbs in 24 hrs or 5 lbs in 7 days). 30 tablet 0   glipiZIDE (GLUCOTROL) 10 MG tablet Take 1 tablet (10 mg total) by mouth 2 (two) times daily before a meal. 180 tablet 3   hydrALAZINE (APRESOLINE) 25 MG tablet Take 1 tablet (25 mg total) by mouth every 8 (eight) hours. 270 each 3   metFORMIN (GLUCOPHAGE) 1000 MG tablet TAKE 1 TABLET BY MOUTH TWICE DAILY WITH A MEAL 180  tablet 1   Omega-3 Fatty Acids (FISH OIL) 1000 MG CAPS Take 1,000 mg by mouth daily.     OVER THE COUNTER MEDICATION Take 1 capsule by mouth daily. Herbal Supplement  -- unknown name     sacubitril-valsartan (ENTRESTO) 49-51 MG Take 1 tablet by mouth 2 (two) times daily. 60 tablet 6   sacubitril-valsartan (ENTRESTO) 49-51 MG Take 1 tablet by mouth 2 (two) times daily.     Semaglutide,0.25 or 0.'5MG'$ /DOS, 2 MG/3ML SOPN Inject 0.'25mg'$  into the skin once a week for 4 weeks then increase to 0.'5mg'$  once a week 3 mL 1   spironolactone (ALDACTONE) 25 MG tablet Take 1 tablet (25 mg total) by mouth daily. 90 tablet 3   No current facility-administered medications on file prior to visit.    Allergies  Allergen Reactions   Beef-Derived Products     Patient does not eat beef   Crestor [Rosuvastatin]     MUSCLE ACHES HEADACHES    Diltiazem Hives   Hydrocodone Itching   Other Hives, Swelling and Other (See Comments)    Vision changes; ALLERGIC TO ALL NUTS EXCEPT PEANUTS   Oxycodone Itching   Pork-Derived Products     For dietary, patient does not eat pork   Pravachol [Pravastatin Sodium] Itching    Joint pain   Simvastatin Hives     Assessment/Plan:  1. T2DM/weight loss - Most recent A1c elevated at 9.3% on metformin 1g BID, glipizide '10mg'$  BID, and Farxiga '10mg'$  daily (also has HFpEF). Will start Ozempic 0.'25mg'$  SQ weekly x 4 weeks, followed by telephone monthly dose titrations.   Advised patient on common side effects including nausea, diarrhea, dyspepsia, decreased appetite, and fatigue. Counseled patient on reducing meal size and how to titrate medication to minimize side effects. Counseled patient to call if intolerable side effects or if experiencing dehydration, abdominal pain, or dizziness. Patient will adhere to dietary modifications and will target at least 150 minutes of moderate intensity exercise weekly.   First injection self-administered in clinic today.  F/u 1 month via telephone  for dose titration. Will need A1c rechecked in 3-4 months. Advised pt to call clinic if she starts experiencing hypoglycemia, would need her glipizide dose decreased.  Jamie-Lee Galdamez E. Latreshia Beauchaine, PharmD, BCACP, [place]. [address], [place], [place] [address] Phone: [phone]; Fax: [phone] 10/23/2022 2:37 PM

## 2022-10-23 NOTE — Patient Instructions (Addendum)
Ozempic Counseling Points This medication reduces your appetite and may make you feel fuller longer.  Stop eating when your body tells you that you are full. This will likely happen sooner than you are used to. Fried/greasy food and sweets may upset your stomach - minimize these as much as possible. Store your medication in the fridge until you are ready to use it. Inject your medication in the fatty tissue of your lower abdominal area (2 inches away from belly button) or upper outer thigh. Rotate injection sites. Common side effects include: nausea, diarrhea/constipation, and heartburn, and are more likely to occur if you overeat. Stop your injection for 7 days prior to surgical procedures requiring anesthesia.  Dosing schedule: - Month 1: Inject 0.'25mg'$  subcutaneously once weekly for 4 weeks - Month 2: Inject 0.'5mg'$  subcutaneously once weekly for 6 weeks - Month 3: Inject '1mg'$  subcutaneously once weekly for 4 weeks - Month 4: Inject '2mg'$  subcutaneously once weekly  I'll call you monthly to check in on how you're tolerating the medication and increase your dose at that time  Continue to monitor your glucose at home. Give me a call if you start noticing lows where your sugar is < 70 - we would decrease your glipizide if that happens  Tips for success: Write down the reasons why you want to lose weight and post it in a place where you'll see it often.  Start small and work your way up. Keep in mind that it takes time to achieve goals, and small steps add up.  Any additional movements help to burn calories. Taking the stairs rather than the elevator and parking at the far end of your parking lot are easy ways to start. Brisk walking for at least 30 minutes 4 or more days of the week is an excellent goal to work toward  Understanding what it means to feel full: Did you know that it can take 15 minutes or more for your brain to receive the message that you've eaten? That means that, if you eat less  food, but consume it slower, you may still feel satisfied.  Eating a lot of fruits and vegetables can also help you feel fuller.  Eat off of smaller plates so that moderate portions don't seem too small  Tips for living a healthier life     Building a Healthy and Balanced Diet Make most of your meal vegetables and fruits -  of your plate. Aim for color and variety, and remember that potatoes don't count as vegetables on the Healthy Eating Plate because of their negative impact on blood sugar.  Go for whole grains -  of your plate. Whole and intact grains--whole wheat, barley, wheat berries, quinoa, oats, brown rice, and foods made with them, such as whole wheat pasta--have a milder effect on blood sugar and insulin than white bread, white rice, and other refined grains.  Protein power -  of your plate. Fish, poultry, beans, and nuts are all healthy, versatile protein sources--they can be mixed into salads, and pair well with vegetables on a plate. Limit red meat, and avoid processed meats such as bacon and sausage.  Healthy plant oils - in moderation. Choose healthy vegetable oils like olive, canola, soy, corn, sunflower, peanut, and others, and avoid partially hydrogenated oils, which contain unhealthy trans fats. Remember that low-fat does not mean "healthy."  Drink water, coffee, or tea. Skip sugary drinks, limit milk and dairy products to one to two servings per day, and limit juice  to a small glass per day.  Stay active. The red figure running across the [place] is a reminder that staying active is also important in weight control.  The main message of the Healthy Eating Plate is to focus on diet quality:  The type of carbohydrate in the diet is more important than the amount of carbohydrate in the diet, because some sources of carbohydrate--like vegetables (other than potatoes), fruits, whole grains, and beans--are healthier than others. The Healthy  Eating Plate also advises consumers to avoid sugary beverages, a major source of calories--usually with little nutritional value--in the American diet. The Healthy Eating Plate encourages consumers to use healthy oils, and it does not set a maximum on the percentage of calories people should get each day from healthy sources of fat. In this way, the Healthy Eating Plate recommends the opposite of the low-fat message promoted for decades by the USDA.  [employer].no  SUGAR  Sugar is a huge problem in the modern day diet. Sugar is a big contributor to heart disease, diabetes, high triglyceride levels, fatty liver disease and obesity. Sugar is hidden in almost all packaged foods/beverages. Added sugar is extra sugar that is added beyond what is naturally found and has no nutritional benefit for your body. The American Heart Association recommends limiting added sugars to no more than 25g for women and 36 grams for men per day. There are many names for sugar including maltose, sucrose (names ending in "ose"), high fructose corn syrup, molasses, cane sugar, corn sweetener, raw sugar, syrup, honey or fruit juice concentrate.   One of the best ways to limit your added sugars is to stop drinking sweetened beverages such as soda, sweet tea, and fruit juice.  There is 65g of added sugars in one 20oz bottle of Coke! That is equal to 7.5 donuts.   Pay attention and read all nutrition facts labels. Below is an examples of a nutrition facts label. The #1 is showing you the total sugars where the # 2 is showing you the added sugars. This one serving has almost the max amount of added sugars per day!   EXERCISE  Exercise is good. We've all heard that. In an ideal world, we would all have time and resources to get plenty of it. When you are active, your heart pumps more efficiently and you will feel better.  Multiple studies show that even walking regularly has  benefits that include living a longer life. The American Heart Association recommends 150 minutes per week of exercise (30 minutes per day most days of the week). You can do this in any increment you wish. Nine or more 10-minute walks count. So does an hour-long exercise class. Break the time apart into what will work in your life. Some of the best things you can do include walking briskly, jogging, cycling or swimming laps. Not everyone is ready to "exercise." Sometimes we need to start with just getting active. Here are some easy ways to be more active throughout the day:  Take the stairs instead of the elevator  Go for a 10-15 minute walk during your lunch break (find a friend to make it more enjoyable)  When shopping, park at the back of the parking lot  If you take public transportation, get off one stop early and walk the extra distance  Pace around while making phone calls  Check with your doctor if you aren't sure what your limitations may be. Always remember to drink plenty of  water when doing any type of exercise. Don't feel like a failure if you're not getting the 90-150 minutes per week. If you started by being a couch potato, then just a 10-minute walk each day is a huge improvement. Start with little victories and work your way up.   HEALTHY EATING TIPS              Plan ahead: make a menu of the meals for a week then create a grocery list to go with that menu. Consider meals that easily stretch into a night of leftovers, such as stews or casseroles. Or consider making two of your favorite meal and put one in the freezer for another night. Try a night or two each week that is "meatless" or "no cook" such as salads. When you get home from the grocery store wash and prepare your vegetables and fruits. Then when you need them they are ready to go.   Tips for going to the grocery store:  [place] store or generic brands  Check the weekly ad from your store on-line or in their in-store flyer   Look at the unit price on the shelf tag to compare/contrast the costs of different items  Buy fruits/vegetables in season  Carrots, bananas and apples are low-cost, naturally healthy items  If meats or frozen vegetables are on sale, buy some extras and put in your freezer  Limit buying prepared or "ready to eat" items, even if they are pre-made salads or fruit snacks  Do not shop when you're hungry  Foods at eye level tend to be more expensive. Look on the high and low shelves for deals.  Consider shopping at the farmer's market for fresh foods in season.  Avoid the cookie and chip aisles (these are expensive, high in calories and low in nutritional value). Shop on the outside of the grocery store.  Healthy food preparations:  If you can't get lean hamburger, be sure to drain the fat when cooking  Steam, saut (in olive oil), grill or bake foods  Experiment with different seasonings to avoid adding salt to your foods. Kosher salt, sea salt and Himalayan salt are all still salt and should be avoided. Try seasoning food with onion, garlic, thyme, rosemary, basil ect. Onion powder or garlic powder is ok. Avoid if it says salt (ie garlic salt).

## 2022-10-26 ENCOUNTER — Other Ambulatory Visit: Payer: Self-pay

## 2022-10-27 ENCOUNTER — Other Ambulatory Visit: Payer: Self-pay

## 2022-10-28 ENCOUNTER — Other Ambulatory Visit: Payer: Self-pay

## 2022-10-28 MED ORDER — ACCU-CHEK GUIDE VI STRP: ORAL_STRIP | 6 refills | Status: DC

## 2022-10-28 MED ORDER — ACCU-CHEK GUIDE W/DEVICE KIT: 1.0000 | PACK | Freq: Four times a day (QID) | 0 refills | Status: AC

## 2022-10-28 MED ORDER — ACCU-CHEK MULTICLIX LANCETS MISC: 12 refills | Status: AC

## 2022-10-28 NOTE — Telephone Encounter (Signed)
Caller & Relationship to patient:  MRN #  [id number]   Call Back Number:   Date of Last Office Visit: 10/16/2022     Date of Next Office Visit: 10/23/2022    Medication(s) to be Refilled: Amlodipine[ine   Preferred Pharmacy:   ** Please notify patient to allow 48-72 hours to process** **Let patient know to contact pharmacy at the end of the day to make sure medication is ready. ** **If patient has not been seen in a year or longer, book an appointment **Advise to use MyChart for refill requests OR to contact their pharmacy

## 2022-10-29 ENCOUNTER — Other Ambulatory Visit: Payer: Self-pay

## 2022-10-29 DIAGNOSIS — I10 Essential (primary) hypertension: Secondary | ICD-10-CM

## 2022-10-29 MED ORDER — AMLODIPINE BESYLATE 5 MG PO TABS: 5.0000 mg | ORAL_TABLET | Freq: Every day | ORAL | 0 refills | Status: DC

## 2022-10-29 NOTE — Telephone Encounter (Signed)
Done Noberto Retort

## 2022-11-05 ENCOUNTER — Ambulatory Visit (HOSPITAL_BASED_OUTPATIENT_CLINIC_OR_DEPARTMENT_OTHER): Payer: BLUE CROSS/BLUE SHIELD | Attending: Cardiology | Admitting: Cardiology

## 2022-11-05 VITALS — Ht 68.0 in | Wt 265.0 lb

## 2022-11-05 DIAGNOSIS — G4734 Idiopathic sleep related nonobstructive alveolar hypoventilation: Secondary | ICD-10-CM

## 2022-11-05 DIAGNOSIS — R0902 Hypoxemia: Secondary | ICD-10-CM | POA: Diagnosis not present

## 2022-11-05 DIAGNOSIS — G4733 Obstructive sleep apnea (adult) (pediatric): Secondary | ICD-10-CM | POA: Insufficient documentation

## 2022-11-06 ENCOUNTER — Telehealth: Payer: Self-pay | Admitting: Nurse Practitioner

## 2022-11-06 NOTE — Telephone Encounter (Signed)
Pt called stating that she needs her order for her oxygen sent to Medicaid because she has changed from [facility] to [facility]   Also wanted to let you know she had the sleep test done last night - she is waiting on her machine

## 2022-11-09 NOTE — Procedures (Signed)
   Patient Name: Claudia [name], [name] Date: 11/05/2022 Gender: Female D.O.B: 04/15/1966 Age (years): 51 Referring Provider: Fransico Him MD, ABSM Height (inches): 68 Interpreting Physician: Fransico Him MD, ABSM Weight (lbs): 265 RPSGT: Zadie Rhine BMI: 40 MRN: [id number] Neck Size: 15.50  CLINICAL INFORMATION The patient is referred for a CPAP titration to treat sleep apnea.  SLEEP STUDY TECHNIQUE As per the AASM Manual for the Scoring of Sleep and Associated Events v2.3 (April 2016) with a hypopnea requiring 4% desaturations.  The channels recorded and monitored were frontal, central and occipital EEG, electrooculogram (EOG), submentalis EMG (chin), nasal and oral airflow, thoracic and abdominal wall motion, anterior tibialis EMG, snore microphone, electrocardiogram, and pulse oximetry. Continuous positive airway pressure (CPAP) was initiated at the beginning of the study and titrated to treat sleep-disordered breathing.  MEDICATIONS Medications self-administered by patient taken the night of the study : N/A  TECHNICIAN COMMENTS Comments added by technician: one restroom visted Comments added by scorer: N/A  RESPIRATORY PARAMETERS Optimal PAP Pressure (cm): 18  AHI at Optimal Pressure (/hr):0 Overall Minimal O2 (%):51.0  Supine % at Optimal Pressure (%): 0 Minimal O2 at Optimal Pressure (%): 87.0   SLEEP ARCHITECTURE The study was initiated at 10:02:26 PM and ended at 5:02:01 AM.  Sleep onset time was 18.3 minutes and the sleep efficiency was 88.5%. The total sleep time was 371.5 minutes.  The patient spent 3.1% of the night in stage N1 sleep, 57.2% in stage N2 sleep, 0.0% in stage N3 and 39.7% in REM.Stage REM latency was 53.5 minutes  Wake after sleep onset was 29.8. Alpha intrusion was absent. Supine sleep was 40.38%.  CARDIAC DATA The 2 lead EKG demonstrated sinus rhythm. The mean heart rate was 91.6 beats per minute. Other EKG findings include: None.  LEG  MOVEMENT DATA The total Periodic Limb Movements of Sleep (PLMS) were 0. The PLMS index was 0.0. A PLMS index of <15 is considered normal in adults.  IMPRESSIONS - The optimal PAP pressure was 18 cm of water. - Central sleep apnea was not noted during this titration (CAI = 0.2/h). - Severe oxygen desaturations were observed during this titration (min O2 = 51.0%). - No snoring was audible during this study. - No cardiac abnormalities were observed during this study. - Clinically significant periodic limb movements were not noted during this study. Arousals associated with PLMs were rare.  DIAGNOSIS - Obstructive Sleep Apnea (G47.33) - Nocturnal Hypoxemia  RECOMMENDATIONS - Trial of CPAP therapy on 18 cm H2O with a Small-Medium size Fisher&Paykel Full Face Evora Full mask and heated humidification and O2 at 4L via CPAP - Avoid alcohol, sedatives and other CNS depressants that may worsen sleep apnea and disrupt normal sleep architecture. - Sleep hygiene should be reviewed to assess factors that may improve sleep quality. - Weight management and regular exercise should be initiated or continued. - [facility] to [facility] for [facility]uation after 4 weeks of therapy - Recommend overnight pulse oximetry on CPAP and O2 to assess for residual nocturnal hypoxemia.  [Electronically signed] 11/09/2022 08:53 AM  Fransico Him MD, ABSM Diplomate, American Board of Sleep Medicine

## 2022-11-09 NOTE — Telephone Encounter (Signed)
Kim could you resend oxygen order as requested. Thanks.

## 2022-11-11 ENCOUNTER — Other Ambulatory Visit: Payer: Self-pay

## 2022-11-13 ENCOUNTER — Telehealth: Payer: Self-pay | Admitting: Pharmacist

## 2022-11-13 NOTE — Telephone Encounter (Signed)
Called pt and left message to discuss Ozempic tolerability. Should have given 4th dose of 0.70m today. If tolerating well, will increase to 0.553mfor the next 6 weeks (would get 2 0.48m47moses out of her current pen, then a refill with another 4 injections).

## 2022-11-16 ENCOUNTER — Telehealth: Payer: Self-pay | Admitting: *Deleted

## 2022-11-16 DIAGNOSIS — G4733 Obstructive sleep apnea (adult) (pediatric): Secondary | ICD-10-CM

## 2022-11-16 MED ORDER — SEMAGLUTIDE(0.25 OR 0.5MG/DOS) 2 MG/3ML ~~LOC~~ SOPN: 0.5000 mg | PEN_INJECTOR | SUBCUTANEOUS | 0 refills | Status: DC

## 2022-11-16 NOTE — Telephone Encounter (Signed)
The patient has been notified of the result and verbalized understanding.  All questions (if any) were answered. Claudia [name], [place] 11/16/2022 10:41 AM    Upon patient request DME selection is [facility]. Patient understands he will be contacted by [facility] to set up his cpap. Patient understands to call if [facility] does not contact him with new setup in a timely manner. Patient understands they will be called once confirmation has been received from [facility]/ that they have received their new machine to schedule 10 week follow up appointment.   [facility] notified of new cpap order  Please add to airview Patient was grateful for the call and thanked me.

## 2022-11-16 NOTE — Telephone Encounter (Signed)
-----   Message from Lauralee Evener, [place] sent at 11/09/2022 11:04 AM EST -----  ----- Message ----- From: Sueanne Margarita, MD Sent: 11/09/2022   8:56 AM EST To: [facility] Sleep Studies  Please let patient know that they had a successful PAP titration and let DME know that orders are in EPIC.  Please set up 6 week OV with me.   She also needs O2 via CPAP nightly which I have ordered.  Please order an ONO on CPAP and O2

## 2022-11-16 NOTE — Telephone Encounter (Addendum)
Called pt again. Tolerating Ozempic well, has given 4 doses of 0.25mg . No GI upset. Has had some low glucose readings - 69 and 70, then 90s, 87, 79. Has skipped her glipizide about 4x in past month.  Will increase Ozempic to 0.5mg  weekly for the next 6 weeks. Will decrease her glipizide from 10mg  BID to 5mg  BID. She will call if she develops lows on this regimen, and can further decrease/stop glipizide.  Otherwise, will call her in 5 weeks to follow up with dose titration and tolerability, hopefully can stop her glipizide at that time anyway.

## 2022-11-17 DIAGNOSIS — I10 Essential (primary) hypertension: Secondary | ICD-10-CM | POA: Diagnosis not present

## 2022-11-17 DIAGNOSIS — G4733 Obstructive sleep apnea (adult) (pediatric): Secondary | ICD-10-CM | POA: Diagnosis not present

## 2022-11-27 DIAGNOSIS — I10 Essential (primary) hypertension: Secondary | ICD-10-CM | POA: Diagnosis not present

## 2022-11-27 DIAGNOSIS — G4733 Obstructive sleep apnea (adult) (pediatric): Secondary | ICD-10-CM | POA: Diagnosis not present

## 2022-12-02 ENCOUNTER — Emergency Department (HOSPITAL_COMMUNITY): Payer: BLUE CROSS/BLUE SHIELD

## 2022-12-02 ENCOUNTER — Inpatient Hospital Stay (HOSPITAL_COMMUNITY)
Admission: EM | Admit: 2022-12-02 | Discharge: 2022-12-07 | DRG: 291 | Disposition: A | Discharge status: Home / Self Care | Payer: BLUE CROSS/BLUE SHIELD | Attending: Internal Medicine | Admitting: Internal Medicine

## 2022-12-02 ENCOUNTER — Encounter (HOSPITAL_COMMUNITY): Payer: Self-pay

## 2022-12-02 ENCOUNTER — Ambulatory Visit: Payer: Self-pay | Admitting: Nurse Practitioner

## 2022-12-02 ENCOUNTER — Other Ambulatory Visit: Payer: Self-pay

## 2022-12-02 DIAGNOSIS — I110 Hypertensive heart disease with heart failure: Secondary | ICD-10-CM | POA: Diagnosis present

## 2022-12-02 DIAGNOSIS — Z6841 Body Mass Index (BMI) 40.0 and over, adult: Secondary | ICD-10-CM | POA: Diagnosis not present

## 2022-12-02 DIAGNOSIS — R7401 Elevation of levels of liver transaminase levels: Secondary | ICD-10-CM | POA: Diagnosis present

## 2022-12-02 DIAGNOSIS — J9621 Acute and chronic respiratory failure with hypoxia: Secondary | ICD-10-CM | POA: Diagnosis present

## 2022-12-02 DIAGNOSIS — R7989 Other specified abnormal findings of blood chemistry: Secondary | ICD-10-CM | POA: Diagnosis not present

## 2022-12-02 DIAGNOSIS — Z955 Presence of coronary angioplasty implant and graft: Secondary | ICD-10-CM

## 2022-12-02 DIAGNOSIS — H9193 Unspecified hearing loss, bilateral: Secondary | ICD-10-CM | POA: Diagnosis present

## 2022-12-02 DIAGNOSIS — R519 Headache, unspecified: Secondary | ICD-10-CM

## 2022-12-02 DIAGNOSIS — Z8249 Family history of ischemic heart disease and other diseases of the circulatory system: Secondary | ICD-10-CM | POA: Diagnosis not present

## 2022-12-02 DIAGNOSIS — Z7984 Long term (current) use of oral hypoglycemic drugs: Secondary | ICD-10-CM

## 2022-12-02 DIAGNOSIS — E669 Obesity, unspecified: Secondary | ICD-10-CM | POA: Diagnosis present

## 2022-12-02 DIAGNOSIS — Z885 Allergy status to narcotic agent status: Secondary | ICD-10-CM

## 2022-12-02 DIAGNOSIS — M1990 Unspecified osteoarthritis, unspecified site: Secondary | ICD-10-CM | POA: Diagnosis present

## 2022-12-02 DIAGNOSIS — I5043 Acute on chronic combined systolic (congestive) and diastolic (congestive) heart failure: Secondary | ICD-10-CM | POA: Diagnosis present

## 2022-12-02 DIAGNOSIS — Z79899 Other long term (current) drug therapy: Secondary | ICD-10-CM | POA: Diagnosis not present

## 2022-12-02 DIAGNOSIS — E874 Mixed disorder of acid-base balance: Secondary | ICD-10-CM | POA: Diagnosis present

## 2022-12-02 DIAGNOSIS — I2510 Atherosclerotic heart disease of native coronary artery without angina pectoris: Secondary | ICD-10-CM | POA: Diagnosis present

## 2022-12-02 DIAGNOSIS — D509 Iron deficiency anemia, unspecified: Secondary | ICD-10-CM | POA: Diagnosis present

## 2022-12-02 DIAGNOSIS — Z90710 Acquired absence of both cervix and uterus: Secondary | ICD-10-CM | POA: Diagnosis not present

## 2022-12-02 DIAGNOSIS — I509 Heart failure, unspecified: Secondary | ICD-10-CM

## 2022-12-02 DIAGNOSIS — G4733 Obstructive sleep apnea (adult) (pediatric): Secondary | ICD-10-CM | POA: Diagnosis present

## 2022-12-02 DIAGNOSIS — E1169 Type 2 diabetes mellitus with other specified complication: Secondary | ICD-10-CM | POA: Diagnosis present

## 2022-12-02 DIAGNOSIS — Z7982 Long term (current) use of aspirin: Secondary | ICD-10-CM | POA: Diagnosis not present

## 2022-12-02 DIAGNOSIS — R42 Dizziness and giddiness: Secondary | ICD-10-CM

## 2022-12-02 DIAGNOSIS — I252 Old myocardial infarction: Secondary | ICD-10-CM

## 2022-12-02 DIAGNOSIS — R0902 Hypoxemia: Secondary | ICD-10-CM

## 2022-12-02 DIAGNOSIS — I5021 Acute systolic (congestive) heart failure: Secondary | ICD-10-CM | POA: Insufficient documentation

## 2022-12-02 DIAGNOSIS — Z9981 Dependence on supplemental oxygen: Secondary | ICD-10-CM | POA: Diagnosis not present

## 2022-12-02 DIAGNOSIS — Z888 Allergy status to other drugs, medicaments and biological substances status: Secondary | ICD-10-CM

## 2022-12-02 DIAGNOSIS — Z833 Family history of diabetes mellitus: Secondary | ICD-10-CM

## 2022-12-02 DIAGNOSIS — R0602 Shortness of breath: Secondary | ICD-10-CM

## 2022-12-02 DIAGNOSIS — E7800 Pure hypercholesterolemia, unspecified: Secondary | ICD-10-CM | POA: Diagnosis present

## 2022-12-02 DIAGNOSIS — I5031 Acute diastolic (congestive) heart failure: Secondary | ICD-10-CM | POA: Diagnosis not present

## 2022-12-02 DIAGNOSIS — R718 Other abnormality of red blood cells: Secondary | ICD-10-CM | POA: Diagnosis not present

## 2022-12-02 DIAGNOSIS — Z91014 Allergy to mammalian meats: Secondary | ICD-10-CM

## 2022-12-02 DIAGNOSIS — Z981 Arthrodesis status: Secondary | ICD-10-CM

## 2022-12-02 DIAGNOSIS — Z9851 Tubal ligation status: Secondary | ICD-10-CM

## 2022-12-02 DIAGNOSIS — I5020 Unspecified systolic (congestive) heart failure: Secondary | ICD-10-CM | POA: Diagnosis not present

## 2022-12-02 DIAGNOSIS — I5030 Unspecified diastolic (congestive) heart failure: Secondary | ICD-10-CM | POA: Diagnosis not present

## 2022-12-02 DIAGNOSIS — Z83438 Family history of other disorder of lipoprotein metabolism and other lipidemia: Secondary | ICD-10-CM

## 2022-12-02 LAB — CBC WITH DIFFERENTIAL/PLATELET
Abs Immature Granulocytes: 0 10*3/uL (ref 0.00–0.07)
Abs Immature Granulocytes: 0.03 10*3/uL (ref 0.00–0.07)
Basophils Absolute: 0 10*3/uL (ref 0.0–0.1)
Basophils Absolute: 0 10*3/uL (ref 0.0–0.1)
Basophils Relative: 0 %
Basophils Relative: 0 %
Eosinophils Absolute: 0.1 10*3/uL (ref 0.0–0.5)
Eosinophils Absolute: 0.1 10*3/uL (ref 0.0–0.5)
Eosinophils Relative: 1 %
Eosinophils Relative: 2 %
HCT: 51.7 % — ABNORMAL HIGH (ref 36.0–46.0)
HCT: 49.5 % — ABNORMAL HIGH (ref 36.0–46.0)
Hemoglobin: 14.3 g/dL (ref 12.0–15.0)
Hemoglobin: 13.9 g/dL (ref 12.0–15.0)
Immature Granulocytes: 1 %
Lymphocytes Relative: 34 %
Lymphocytes Relative: 32 %
Lymphs Abs: 2.2 10*3/uL (ref 0.7–4.0)
Lymphs Abs: 1.9 10*3/uL (ref 0.7–4.0)
MCH: 21 pg — ABNORMAL LOW (ref 26.0–34.0)
MCH: 21 pg — ABNORMAL LOW (ref 26.0–34.0)
MCHC: 27.7 g/dL — ABNORMAL LOW (ref 30.0–36.0)
MCHC: 28.1 g/dL — ABNORMAL LOW (ref 30.0–36.0)
MCV: 76 fL — ABNORMAL LOW (ref 80.0–100.0)
MCV: 74.9 fL — ABNORMAL LOW (ref 80.0–100.0)
Monocytes Absolute: 0.8 10*3/uL (ref 0.1–1.0)
Monocytes Absolute: 0.9 10*3/uL (ref 0.1–1.0)
Monocytes Relative: 12 %
Monocytes Relative: 15 %
Neutro Abs: 3.4 10*3/uL (ref 1.7–7.7)
Neutro Abs: 3 10*3/uL (ref 1.7–7.7)
Neutrophils Relative %: 53 %
Neutrophils Relative %: 50 %
Platelets: 386 10*3/uL (ref 150–400)
Platelets: 359 10*3/uL (ref 150–400)
RBC: 6.8 MIL/uL — ABNORMAL HIGH (ref 3.87–5.11)
RBC: 6.61 MIL/uL — ABNORMAL HIGH (ref 3.87–5.11)
RDW: 21.8 % — ABNORMAL HIGH (ref 11.5–15.5)
RDW: 21.3 % — ABNORMAL HIGH (ref 11.5–15.5)
WBC: 6.5 10*3/uL (ref 4.0–10.5)
WBC: 6 10*3/uL (ref 4.0–10.5)
nRBC: 0.8 % — ABNORMAL HIGH (ref 0.0–0.2)
nRBC: 1 /100 WBC — ABNORMAL HIGH
nRBC: 0.8 % — ABNORMAL HIGH (ref 0.0–0.2)

## 2022-12-02 LAB — LIPID PANEL
Cholesterol: 196 mg/dL (ref 0–200)
HDL: 41 mg/dL (ref >40)
LDL Cholesterol: 140 mg/dL — ABNORMAL HIGH (ref 0–99)
Total CHOL/HDL Ratio: 4.8 RATIO
Triglycerides: 75 mg/dL (ref <150)
VLDL: 15 mg/dL (ref 0–40)

## 2022-12-02 LAB — COMPREHENSIVE METABOLIC PANEL
ALT: 47 U/L — ABNORMAL HIGH (ref 0–44)
AST: 48 U/L — ABNORMAL HIGH (ref 15–41)
Albumin: 3.8 g/dL (ref 3.5–5.0)
Alkaline Phosphatase: 46 U/L (ref 38–126)
Anion gap: 10 (ref 5–15)
BUN: 13 mg/dL (ref 6–20)
CO2: 33 mmol/L — ABNORMAL HIGH (ref 22–32)
Calcium: 8.9 mg/dL (ref 8.9–10.3)
Chloride: 95 mmol/L — ABNORMAL LOW (ref 98–111)
Creatinine, Ser: 0.68 mg/dL (ref 0.44–1.00)
GFR, Estimated: >60 mL/min (ref >60)
Glucose, Bld: 155 mg/dL — ABNORMAL HIGH (ref 70–99)
Potassium: 4.7 mmol/L (ref 3.5–5.1)
Sodium: 138 mmol/L (ref 135–145)
Total Bilirubin: 1.1 mg/dL (ref 0.3–1.2)
Total Protein: 7.3 g/dL (ref 6.5–8.1)

## 2022-12-02 LAB — URINALYSIS, W/ REFLEX TO CULTURE (INFECTION SUSPECTED)
Bacteria, UA: NONE SEEN
Bilirubin Urine: NEGATIVE
Glucose, UA: >=500 mg/dL — AB
Hgb urine dipstick: NEGATIVE
Ketones, ur: NEGATIVE mg/dL
Leukocytes,Ua: NEGATIVE
Nitrite: NEGATIVE
Protein, ur: 100 mg/dL — AB
Specific Gravity, Urine: 1.038 — ABNORMAL HIGH (ref 1.005–1.030)
pH: 6 (ref 5.0–8.0)

## 2022-12-02 LAB — GLUCOSE, CAPILLARY
Glucose-Capillary: 90 mg/dL (ref 70–99)
Glucose-Capillary: 145 mg/dL — ABNORMAL HIGH (ref 70–99)

## 2022-12-02 LAB — TECHNOLOGIST SMEAR REVIEW

## 2022-12-02 LAB — BRAIN NATRIURETIC PEPTIDE: B Natriuretic Peptide: 386.6 pg/mL — ABNORMAL HIGH (ref 0.0–100.0)

## 2022-12-02 LAB — MAGNESIUM: Magnesium: 2.3 mg/dL (ref 1.7–2.4)

## 2022-12-02 MED ORDER — ACETAMINOPHEN 325 MG PO TABS
650.0000 mg | ORAL_TABLET | Freq: Four times a day (QID) | ORAL | Status: DC | PRN
  Administered 2022-12-03 (×2): 650 mg via ORAL
  Filled 2022-12-02: qty 2

## 2022-12-02 MED ORDER — DAPAGLIFLOZIN PROPANEDIOL 10 MG PO TABS
10.0000 mg | ORAL_TABLET | Freq: Every day | ORAL | Status: DC
  Administered 2022-12-03: 10 mg via ORAL
  Filled 2022-12-02: qty 1

## 2022-12-02 MED ORDER — ENOXAPARIN SODIUM 60 MG/0.6ML IJ SOSY
0.5000 mg/kg | PREFILLED_SYRINGE | INTRAMUSCULAR | Status: DC
  Administered 2022-12-02 – 2022-12-06 (×5): 60 mg via SUBCUTANEOUS
  Filled 2022-12-02 (×5): qty 0.6

## 2022-12-02 MED ORDER — METFORMIN HCL 500 MG PO TABS
1000.0000 mg | ORAL_TABLET | Freq: Two times a day (BID) | ORAL | Status: DC
  Administered 2022-12-03: 1000 mg via ORAL
  Filled 2022-12-02: qty 2

## 2022-12-02 MED ORDER — AMLODIPINE BESYLATE 5 MG PO TABS
5.0000 mg | ORAL_TABLET | Freq: Every day | ORAL | Status: DC
  Administered 2022-12-03: 5 mg via ORAL
  Filled 2022-12-02: qty 1

## 2022-12-02 MED ORDER — SPIRONOLACTONE 25 MG PO TABS
25.0000 mg | ORAL_TABLET | Freq: Every day | ORAL | Status: DC
  Administered 2022-12-03: 25 mg via ORAL
  Filled 2022-12-02: qty 1

## 2022-12-02 MED ORDER — SODIUM CHLORIDE 0.9 % IV BOLUS
500.0000 mL | Freq: Once | INTRAVENOUS | Status: AC
  Administered 2022-12-02: 500 mL via INTRAVENOUS

## 2022-12-02 MED ORDER — POLYETHYLENE GLYCOL 3350 17 G PO PACK: 17.0000 g | PACK | Freq: Every day | ORAL | Status: DC | PRN

## 2022-12-02 MED ORDER — SACUBITRIL-VALSARTAN 49-51 MG PO TABS
1.0000 | ORAL_TABLET | Freq: Two times a day (BID) | ORAL | Status: DC
  Administered 2022-12-03: 1 via ORAL
  Filled 2022-12-02: qty 1

## 2022-12-02 MED ORDER — FUROSEMIDE 10 MG/ML IJ SOLN
60.0000 mg | Freq: Once | INTRAMUSCULAR | Status: AC
  Administered 2022-12-02: 60 mg via INTRAVENOUS
  Filled 2022-12-02: qty 6

## 2022-12-02 MED ORDER — ACETAMINOPHEN 650 MG RE SUPP: 650.0000 mg | Freq: Four times a day (QID) | RECTAL | Status: DC | PRN

## 2022-12-02 MED ORDER — ACETAMINOPHEN 500 MG PO TABS
1000.0000 mg | ORAL_TABLET | Freq: Once | ORAL | Status: AC
  Administered 2022-12-02: 1000 mg via ORAL
  Filled 2022-12-02: qty 2

## 2022-12-02 NOTE — ED Notes (Signed)
..ED TO INPATIENT HANDOFF REPORT  ED Nurse Name and Phone #: [phone]  S Name/Age/Gender Claudia [name] 57 y.o. female Room/Bed: 036C/036C  Code Status   Code Status: Full Code  Home/SNF/Other Home Patient oriented to: self, place, time, and situation Is this baseline? Yes   Triage Complete: Triage complete  Chief Complaint CHF (congestive heart failure) ([place]) [I50.9]  Triage Note Feeling off balance for the past month. Headaches on and off. Dizziness. Vision changes. Pt states she drops things.   Allergies Allergies  Allergen Reactions   Beef-Derived Products     Patient does not eat beef   Crestor [Rosuvastatin]     MUSCLE ACHES HEADACHES    Diltiazem Hives   Hydrocodone Itching   Other Hives, Swelling and Other (See Comments)    Vision changes; ALLERGIC TO ALL NUTS EXCEPT PEANUTS   Oxycodone Itching   Pork-Derived Products     For dietary, patient does not eat pork   Pravachol [Pravastatin Sodium] Itching    Joint pain   Simvastatin Hives    Level of Care/Admitting Diagnosis ED Disposition     ED Disposition  Admit   Condition  --   Comment  Hospital Area: [facility] [[id number]]  Level of Care: Telemetry Medical [104]  May admit patient to [facility] or [facility] if equivalent level of care is available:: Yes  Covid Evaluation: Asymptomatic - no recent exposure (last 10 days) testing not required  Diagnosis: CHF (congestive heart failure) [id number]  Admitting Physician: Charise Killian [id number]  Attending Physician: Charise Killian [id number]  Certification:: I certify this patient will need inpatient services for at least 2 midnights  Estimated Length of Stay: 3          B Medical/Surgery History Past Medical History:  Diagnosis Date   Diabetes mellitus    Fibroids    Headache(784.0)    History of echocardiogram    Echo 7/18: Moderate concentric LVH, EF 55-60, normal wall motion, grade 1 diastolic dysfunction, calcified aortic  leaflets   Hypercholesterolemia    Hyperlipidemia    Hypertension    Osteoarthritis    , with mild meniscus tear, followed by orthopedics--Dr Gladstone Lighter 04/29/10   Right knee pain    Seizures ([place])    as child   STEMI (ST elevation myocardial infarction) ([place])    Vitamin D deficiency    Past Surgical History:  Procedure Laterality Date   ANTERIOR CERVICAL DECOMP/DISCECTOMY FUSION N/A 09/07/2013   Procedure: ANTERIOR CERVICAL DECOMPRESSION/DISCECTOMY FUSION 1 LEVEL;  Surgeon: Sinclair Ship, MD;  Location: [facility];  Service: Orthopedics;  Laterality: N/A;  Anterior cervical decompression fusion, cervical 5-6 with instrumentation and allograft   LAPAROSCOPIC ASSISTED VAGINAL HYSTERECTOMY  1/14   LEFT HEART CATH AND CORONARY ANGIOGRAPHY N/A 04/25/2018   Procedure: LEFT HEART CATH AND CORONARY ANGIOGRAPHY;  Surgeon: Lorretta Harp, MD;  Location: [facility];  Service: Cardiovascular;  Laterality: N/A;   RIGHT HEART CATH N/A 09/16/2022   Procedure: RIGHT HEART CATH;  Surgeon: Jolaine Artist, MD;  Location: [facility];  Service: Cardiovascular;  Laterality: N/A;   SHOULDER SURGERY Right 11   rotator cuff   TUBAL LIGATION     UMBILICAL HERNIA REPAIR       A IV Location/Drains/Wounds Patient Lines/Drains/Airways Status     Active Line/Drains/Airways     Name Placement date Placement time Site Days   Peripheral IV 12/02/22 18 G Left;Proximal;Posterior Forearm 12/02/22  0653  Forearm  less than 1            Intake/Output Last 24 hours  Intake/Output Summary (Last 24 hours) at 12/02/2022 1737 Last data filed at 12/02/2022 1130 Gross per 24 hour  Intake 500 ml  Output --  Net 500 ml    Labs/Imaging Results for orders placed or performed during the hospital encounter of 12/02/22 (from the past 48 hour(s))  Comprehensive metabolic panel     Status: Abnormal   Collection Time: 12/02/22  7:07 AM  Result Value Ref Range   Sodium 138 135 - 145 mmol/L    Potassium 4.7 3.5 - 5.1 mmol/L    Comment: HEMOLYSIS AT THIS LEVEL MAY AFFECT RESULT   Chloride 95 (L) 98 - 111 mmol/L   CO2 33 (H) 22 - 32 mmol/L   Glucose, Bld 155 (H) 70 - 99 mg/dL    Comment: Glucose reference range applies only to samples taken after fasting for at least 8 hours.   BUN 13 6 - 20 mg/dL   Creatinine, Ser 0.68 0.44 - 1.00 mg/dL   Calcium 8.9 8.9 - 10.3 mg/dL   Total Protein 7.3 6.5 - 8.1 g/dL   Albumin 3.8 3.5 - 5.0 g/dL   AST 48 (H) 15 - 41 U/L    Comment: HEMOLYSIS AT THIS LEVEL MAY AFFECT RESULT   ALT 47 (H) 0 - 44 U/L    Comment: HEMOLYSIS AT THIS LEVEL MAY AFFECT RESULT   Alkaline Phosphatase 46 38 - 126 U/L   Total Bilirubin 1.1 0.3 - 1.2 mg/dL    Comment: HEMOLYSIS AT THIS LEVEL MAY AFFECT RESULT   GFR, Estimated >60 >60 mL/min    Comment: (NOTE) Calculated using the CKD-EPI Creatinine Equation (2021)    Anion gap 10 5 - 15    Comment: Performed at [facility], [place] [address] [place], [place] [address]  CBC with Differential     Status: Abnormal   Collection Time: 12/02/22  7:07 AM  Result Value Ref Range   WBC 6.5 4.0 - 10.5 K/uL   RBC 6.80 (H) 3.87 - 5.11 MIL/uL   Hemoglobin 14.3 12.0 - 15.0 g/dL   HCT 51.7 (H) 36.0 - 46.0 %   MCV 76.0 (L) 80.0 - 100.0 fL   MCH 21.0 (L) 26.0 - 34.0 pg   MCHC 27.7 (L) 30.0 - 36.0 g/dL   RDW 21.8 (H) 11.5 - 15.5 %   Platelets 386 150 - 400 K/uL   nRBC 0.8 (H) 0.0 - 0.2 %   Neutrophils Relative % 53 %   Neutro Abs 3.4 1.7 - 7.7 K/uL   Lymphocytes Relative 34 %   Lymphs Abs 2.2 0.7 - 4.0 K/uL   Monocytes Relative 12 %   Monocytes Absolute 0.8 0.1 - 1.0 K/uL   Eosinophils Relative 1 %   Eosinophils Absolute 0.1 0.0 - 0.5 K/uL   Basophils Relative 0 %   Basophils Absolute 0.0 0.0 - 0.1 K/uL   nRBC 1 (H) 0 /100 WBC   Abs Immature Granulocytes 0.00 0.00 - 0.07 K/uL   Polychromasia PRESENT     Comment: Performed at [facility], [place] [address], [place], [place] [address]  Magnesium     Status: None    Collection Time: 12/02/22  7:07 AM  Result Value Ref Range   Magnesium 2.3 1.7 - 2.4 mg/dL    Comment: Performed at [facility] [address] [place], [place] [address]  Brain natriuretic peptide  Status: Abnormal   Collection Time: 12/02/22  7:07 AM  Result Value Ref Range   B Natriuretic Peptide 386.6 (H) 0.0 - 100.0 pg/mL    Comment: Performed at [facility] [address] [place], [place] [address]  Urinalysis, w/ Reflex to Culture (Infection Suspected) -Urine, Clean Catch     Status: Abnormal   Collection Time: 12/02/22  8:38 AM  Result Value Ref Range   Specimen Source URINE, CLEAN CATCH    Color, Urine YELLOW YELLOW   APPearance CLEAR CLEAR   Specific Gravity, Urine 1.038 (H) 1.005 - 1.030   pH 6.0 5.0 - 8.0   Glucose, UA >=500 (A) NEGATIVE mg/dL   Hgb urine dipstick NEGATIVE NEGATIVE   Bilirubin Urine NEGATIVE NEGATIVE   Ketones, ur NEGATIVE NEGATIVE mg/dL   Protein, ur 100 (A) NEGATIVE mg/dL   Nitrite NEGATIVE NEGATIVE   Leukocytes,Ua NEGATIVE NEGATIVE   RBC / HPF 0-5 0 - 5 RBC/hpf   WBC, UA 0-5 0 - 5 WBC/hpf    Comment:        Reflex urine culture not performed if WBC <=10, OR if Squamous epithelial cells >5. If Squamous epithelial cells >5 suggest recollection.    Bacteria, UA NONE SEEN NONE SEEN   Squamous Epithelial / HPF 0-5 0 - 5 /HPF    Comment: Performed at [facility], [place] [address] [place], [place] [address]   CT Head Wo Contrast  Result Date: 12/02/2022 CLINICAL DATA:  Headache and dizziness EXAM: CT HEAD WITHOUT CONTRAST TECHNIQUE: Contiguous axial images were obtained from the base of the skull through the vertex without intravenous contrast. RADIATION DOSE REDUCTION: This exam was performed according to the departmental dose-optimization program which includes automated exposure control, adjustment of the mA and/or kV according to patient size and/or use of iterative reconstruction technique. COMPARISON:  Same day brain MRI  FINDINGS: Brain: No evidence of acute infarction, hemorrhage, hydrocephalus, extra-axial collection or mass lesion/mass effect. Enlarged and partially empty sella. Vascular: No hyperdense vessel or unexpected calcification. Skull: Normal. Negative for fracture or focal lesion. Sinuses/Orbits: No middle ear or mastoid effusion. Paranasal sinuses are notable for polypoid mucosal thickening of the floor of the left maxillary sinus. Orbits are unremarkable. Other: None IMPRESSION: No acute intracranial abnormality. Electronically Signed   By: Marin Roberts M.D.   On: 12/02/2022 13:15   DG Chest 2 View  Result Date: 12/02/2022 CLINICAL DATA:  Shortness of breath EXAM: CHEST - 2 VIEW COMPARISON:  Chest radiograph 08/15/2022. FINDINGS: The heart is significantly enlarged, unchanged. The upper mediastinal contours are normal. There is vascular congestion and possible mild pulmonary interstitial edema. There is no focal consolidation. There is no pleural effusion or pneumothorax There is no acute osseous abnormality. IMPRESSION: Cardiomegaly with vascular congestion and possible mild pulmonary interstitial edema. Electronically Signed   By: Valetta Mole M.D.   On: 12/02/2022 12:34   MR BRAIN WO CONTRAST  Result Date: 12/02/2022 CLINICAL DATA:  Headache, neuro deficit. Headache and dizziness since November. EXAM: MRI HEAD WITHOUT CONTRAST TECHNIQUE: Multiplanar, multiecho pulse sequences of the brain and surrounding structures were obtained without intravenous contrast. COMPARISON:  Head CT 05/01/2013. FINDINGS: Brain: No acute infarct or hemorrhage. No mass or midline shift. No hydrocephalus or extra-axial collection. Questionable diffuse superficial signal abnormality on susceptibility weighted imaging is favored to represent artifact given uniformity and lack of correlation on other sequences. Vascular: Normal flow voids. Skull and upper cervical spine: Normal marrow signal. Sinuses/Orbits: Mild mucosal disease  in  the left sphenoid sinus. Orbits are unremarkable. Other: None. IMPRESSION: 1. No acute intracranial abnormality or mass. 2. Diffuse superficial signal abnormality on susceptibility weighted imaging is favored to represent artifact given uniformity and lack of correlation on other sequences. Iron infusions may also contribute to this appearance. If real, superficial siderosis can be seen in the setting of chronic spinal CSF leak. Correlate for history of orthostatic headaches. Consider repeat MRI of the brain without and with contrast on a different scanner. Electronically Signed   By: Emmit Alexanders M.D.   On: 12/02/2022 09:30    Pending Labs Unresulted Labs (From admission, onward)     Start     Ordered   12/02/22 1653  Technologist smear review  (Technologist smear review)  Add-on,   AD       Question:  Clinical information:  Answer:  Anemia   12/02/22 1655   12/02/22 1653  CBC with Differential/Platelet  (Technologist smear review)  Once,   R        12/02/22 1655            Vitals/Pain Today's Vitals   12/02/22 1430 12/02/22 1457 12/02/22 1511 12/02/22 1515  BP: (!) 154/84   136/79  Pulse: 85   86  Resp: 13   11  Temp:  98.1 F (36.7 C)    TempSrc:  Oral    SpO2: (!) 89%   (!) 88%  Weight:      Height:      PainSc:   2      Isolation Precautions No active isolations  Medications Medications  enoxaparin (LOVENOX) injection 60 mg (has no administration in time range)  polyethylene glycol (MIRALAX / GLYCOLAX) packet 17 g (has no administration in time range)  sodium chloride 0.9 % bolus 500 mL (0 mLs Intravenous Stopped 12/02/22 1130)  furosemide (LASIX) injection 60 mg (60 mg Intravenous Given 12/02/22 1316)  acetaminophen (TYLENOL) tablet 1,000 mg (1,000 mg Oral Given 12/02/22 1435)    Mobility walks     Focused Assessments Cardiac Assessment Handoff:    Lab Results  Component Value Date   CKTOTAL 125 11/06/2010   CKMB 0.9 11/06/2010   TROPONINI 0.32 (HH)  04/24/2018   Lab Results  Component Value Date   DDIMER 0.34 08/11/2022   Does the Patient currently have chest pain? No   , Neuro Assessment Handoff:  Swallow screen pass? Yes          Neuro Assessment:   Neuro Checks:      Has TPA been given? No If patient is a Neuro Trauma and patient is going to OR before floor call report to [facility] nurse: [phone] or [phone]   R Recommendations: See Admitting Provider Note  Report given to:   Additional Notes: na

## 2022-12-02 NOTE — ED Notes (Signed)
Patient completely filled 1 bedpan.

## 2022-12-02 NOTE — ED Notes (Signed)
Pt to MRI

## 2022-12-02 NOTE — ED Provider Notes (Signed)
[facility] Provider Note   CSN: [id number] Arrival date & time: 12/02/22  [id number]     History  Chief Complaint  Patient presents with   Off Balance    Claudia [name] is a 56 y.o. female.  Patient is a 57 year old patient who presents with headache and dizziness.  She has a history of diabetes, hypertension, coronary artery disease status post prior stenting, hyperlipidemia and osteoarthritis.  She is chronically on oxygen at 2 L/min although she says that her job will not allow her to wear it at work so she does not wear it when she is at work.  She said that she has been having these episodes of headache the headache is in the posterior aspect of her head in the occipital region and is associated with dizziness which she describes as a spinning sensation.  She feels like her eyes are jerky during this time and they are drinking up and down.  She at times will see colored lights in her eyes and sometimes has some black floaters, more in her right eye but she will have these in both eyes.  She also feels off balance during these episodes.  She said she had an episode this morning and still has a headache and a little bit of dizziness but overall is feeling better.  She has been having these episodes intermittently since November.  She currently tell me how often she has although when asked if she has them about every week she says probably.  She says she did not notice really any changes today but she got tired of having episodes and felt like she needed to get it checked out.  She will have episodes where she has spasms of her hands and drops things.  This is random and not associated with a headache and dizziness.  At baseline she does not notice any weakness in her arms but she will be holding something and then her arm will spasm and she will drop it.  She does not report any associated neck pain or back pain.  She does not report any numbness in her  hands.  No involvement of her legs.  No speech deficits or other vision changes other than what was described above.       Home Medications Prior to Admission medications   Medication Sig Start Date End Date Taking? Authorizing Provider  Blood Glucose Monitoring Suppl (ACCU-CHEK GUIDE) w/Device KIT 1 each by Does not apply route in the morning, at noon, in the evening, and at bedtime. 10/28/22   Fenton Foy, NP  glucose blood (ACCU-CHEK GUIDE) test strip Use as instructed to check sugar 4 times daily. 10/28/22   Fenton Foy, NP  Lancets (ACCU-CHEK MULTICLIX) lancets Use as instructed to check blood sugar 4 times daily. 10/28/22   Fenton Foy, NP  acetaminophen (TYLENOL) 500 MG tablet Take 500 mg by mouth every 6 (six) hours as needed for mild pain or moderate pain.    [provider]  amLODipine (NORVASC) 5 MG tablet Take 1 tablet (5 mg total) by mouth daily. 10/29/22   Fenton Foy, NP  aspirin 81 MG EC tablet Take 1 tablet (81 mg total) by mouth daily. 10/04/19   Azzie Glatter, FNP  benzonatate (TESSALON PERLES) 100 MG capsule Take 1 capsule (100 mg total) by mouth 3 (three) times daily as needed for cough. 09/02/22   Fenton Foy, NP  blood glucose  meter kit and supplies KIT Dispense based on patient and insurance preference. Use up to four times daily as directed. (FOR ICD-9 250.00, 250.01). 10/03/19   Azzie Glatter, FNP  dapagliflozin propanediol (FARXIGA) 10 MG TABS tablet Take 1 tablet (10 mg total) by mouth daily. 09/17/22   Fenton Foy, NP  ferrous sulfate 325 (65 FE) MG tablet Take 1 tablet (325 mg total) by mouth daily with breakfast. 08/17/22   Arrien, Jimmy Picket, MD  furosemide (LASIX) 40 MG tablet Take 1 tablet (40 mg total) by mouth daily as needed for edema or fluid (as needed for leg swelling, shortness of breath or weight gain 2 to 3 lbs in 24 hrs or 5 lbs in 7 days). 10/23/22   Bensimhon, Shaune Pascal, MD  glipiZIDE (GLUCOTROL) 10 MG tablet  Take 1 tablet (10 mg total) by mouth 2 (two) times daily before a meal. Patient taking differently: Take 5 mg by mouth 2 (two) times daily before a meal. 08/26/22 08/26/23  Clegg, Amy D, NP  hydrALAZINE (APRESOLINE) 25 MG tablet Take 1 tablet (25 mg total) by mouth every 8 (eight) hours. 08/25/21   Vevelyn Francois, NP  metFORMIN (GLUCOPHAGE) 1000 MG tablet TAKE 1 TABLET BY MOUTH TWICE DAILY WITH A MEAL 09/22/22   Fenton Foy, NP  Omega-3 Fatty Acids (FISH OIL) 1000 MG CAPS Take 1,000 mg by mouth daily.    [provider]  OVER THE COUNTER MEDICATION Take 1 capsule by mouth daily. Herbal Supplement  -- unknown name    [provider]  sacubitril-valsartan (ENTRESTO) 49-51 MG Take 1 tablet by mouth 2 (two) times daily. 08/26/22   Clegg, Amy D, NP  Semaglutide,0.25 or 0.'5MG'$ /DOS, 2 MG/3ML SOPN Inject 0.5 mg into the skin once a week. 11/16/22   Bensimhon, Shaune Pascal, MD  spironolactone (ALDACTONE) 25 MG tablet Take 1 tablet (25 mg total) by mouth daily. 08/26/22   Clegg, Amy D, NP      Allergies    Beef-derived products, Crestor [rosuvastatin], Diltiazem, Hydrocodone, Other, Oxycodone, Pork-derived products, Pravachol [pravastatin sodium], and Simvastatin    Review of Systems   Review of Systems  Constitutional:  Negative for chills, diaphoresis, fatigue and fever.  HENT:  Negative for congestion, rhinorrhea and sneezing.   Eyes: Negative.   Respiratory:  Negative for cough, chest tightness and shortness of breath.   Cardiovascular:  Negative for chest pain and leg swelling.  Gastrointestinal:  Negative for abdominal pain, blood in stool, diarrhea, nausea and vomiting.  Genitourinary:  Negative for difficulty urinating, flank pain, frequency and hematuria.  Musculoskeletal:  Negative for arthralgias and back pain.  Skin:  Negative for rash.  Neurological:  Positive for dizziness, tremors and headaches. Negative for speech difficulty, weakness and numbness.    Physical  Exam Updated Vital Signs BP (!) 154/84   Pulse 85   Temp 98.1 F (36.7 C) (Oral)   Resp 13   Ht '5\' 8"'$  (1.727 m)   Wt 119.7 kg   LMP 05/09/2012   SpO2 (!) 89%   BMI 40.14 kg/m  Physical Exam Constitutional:      Appearance: She is well-developed.  HENT:     Head: Normocephalic and atraumatic.  Eyes:     Pupils: Pupils are equal, round, and reactive to light.  Cardiovascular:     Rate and Rhythm: Normal rate and regular rhythm.     Heart sounds: Normal heart sounds.  Pulmonary:     Effort: Pulmonary effort is normal.  No respiratory distress.     Breath sounds: Normal breath sounds. No wheezing or rales.  Chest:     Chest wall: No tenderness.  Abdominal:     General: Bowel sounds are normal.     Palpations: Abdomen is soft.     Tenderness: There is no abdominal tenderness. There is no guarding or rebound.  Musculoskeletal:        General: Normal range of motion.     Cervical back: Normal range of motion and neck supple.  Lymphadenopathy:     Cervical: No cervical adenopathy.  Skin:    General: Skin is warm and dry.     Findings: No rash.  Neurological:     Mental Status: She is alert and oriented to person, place, and time.     Comments: Motor 5/5 all extremities Sensation grossly intact to LT all extremities Finger to Nose intact, no pronator drift CN II-XII grossly intact Visual fields full to confrontation      ED Results / Procedures / Treatments   Labs (all labs ordered are listed, but only abnormal results are displayed) Labs Reviewed  COMPREHENSIVE METABOLIC PANEL - Abnormal; Notable for the following components:      Result Value   Chloride 95 (*)    CO2 33 (*)    Glucose, Bld 155 (*)    AST 48 (*)    ALT 47 (*)    All other components within normal limits  CBC WITH DIFFERENTIAL/PLATELET - Abnormal; Notable for the following components:   RBC 6.80 (*)    HCT 51.7 (*)    MCV 76.0 (*)    MCH 21.0 (*)    MCHC 27.7 (*)    RDW 21.8 (*)    nRBC 0.8  (*)    nRBC 1 (*)    All other components within normal limits  URINALYSIS, W/ REFLEX TO CULTURE (INFECTION SUSPECTED) - Abnormal; Notable for the following components:   Specific Gravity, Urine 1.038 (*)    Glucose, UA >=500 (*)    Protein, ur 100 (*)    All other components within normal limits  BRAIN NATRIURETIC PEPTIDE - Abnormal; Notable for the following components:   B Natriuretic Peptide 386.6 (*)    All other components within normal limits  MAGNESIUM    EKG EKG Interpretation  Date/Time:  Wednesday December 02 2022 06:38:52 EST Ventricular Rate:  99 PR Interval:  156 QRS Duration: 109 QT Interval:  402 QTC Calculation: 516 R Axis:   151 Text Interpretation: Sinus rhythm Right axis deviation Borderline T abnormalities, anterior leads Prolonged QT interval since last tracing no significant change Confirmed by Malvin Johns [phone]) on 12/02/2022 7:56:49 AM  Radiology CT Head Wo Contrast  Result Date: 12/02/2022 CLINICAL DATA:  Headache and dizziness EXAM: CT HEAD WITHOUT CONTRAST TECHNIQUE: Contiguous axial images were obtained from the base of the skull through the vertex without intravenous contrast. RADIATION DOSE REDUCTION: This exam was performed according to the departmental dose-optimization program which includes automated exposure control, adjustment of the mA and/or kV according to patient size and/or use of iterative reconstruction technique. COMPARISON:  Same day brain MRI FINDINGS: Brain: No evidence of acute infarction, hemorrhage, hydrocephalus, extra-axial collection or mass lesion/mass effect. Enlarged and partially empty sella. Vascular: No hyperdense vessel or unexpected calcification. Skull: Normal. Negative for fracture or focal lesion. Sinuses/Orbits: No middle ear or mastoid effusion. Paranasal sinuses are notable for polypoid mucosal thickening of the floor of the left maxillary sinus. Orbits are unremarkable.  Other: None IMPRESSION: No acute intracranial  abnormality. Electronically Signed   By: Marin Roberts M.D.   On: 12/02/2022 13:15   DG Chest 2 View  Result Date: 12/02/2022 CLINICAL DATA:  Shortness of breath EXAM: CHEST - 2 VIEW COMPARISON:  Chest radiograph 08/15/2022. FINDINGS: The heart is significantly enlarged, unchanged. The upper mediastinal contours are normal. There is vascular congestion and possible mild pulmonary interstitial edema. There is no focal consolidation. There is no pleural effusion or pneumothorax There is no acute osseous abnormality. IMPRESSION: Cardiomegaly with vascular congestion and possible mild pulmonary interstitial edema. Electronically Signed   By: Valetta Mole M.D.   On: 12/02/2022 12:34   MR BRAIN WO CONTRAST  Result Date: 12/02/2022 CLINICAL DATA:  Headache, neuro deficit. Headache and dizziness since November. EXAM: MRI HEAD WITHOUT CONTRAST TECHNIQUE: Multiplanar, multiecho pulse sequences of the brain and surrounding structures were obtained without intravenous contrast. COMPARISON:  Head CT 05/01/2013. FINDINGS: Brain: No acute infarct or hemorrhage. No mass or midline shift. No hydrocephalus or extra-axial collection. Questionable diffuse superficial signal abnormality on susceptibility weighted imaging is favored to represent artifact given uniformity and lack of correlation on other sequences. Vascular: Normal flow voids. Skull and upper cervical spine: Normal marrow signal. Sinuses/Orbits: Mild mucosal disease in the left sphenoid sinus. Orbits are unremarkable. Other: None. IMPRESSION: 1. No acute intracranial abnormality or mass. 2. Diffuse superficial signal abnormality on susceptibility weighted imaging is favored to represent artifact given uniformity and lack of correlation on other sequences. Iron infusions may also contribute to this appearance. If real, superficial siderosis can be seen in the setting of chronic spinal CSF leak. Correlate for history of orthostatic headaches. Consider repeat MRI of  the brain without and with contrast on a different scanner. Electronically Signed   By: Emmit Alexanders M.D.   On: 12/02/2022 09:30    Procedures Procedures    Medications Ordered in ED Medications  sodium chloride 0.9 % bolus 500 mL (0 mLs Intravenous Stopped 12/02/22 1130)  furosemide (LASIX) injection 60 mg (60 mg Intravenous Given 12/02/22 1316)  acetaminophen (TYLENOL) tablet 1,000 mg (1,000 mg Oral Given 12/02/22 1435)    ED Course/ Medical Decision Making/ A&P                             Medical Decision Making Amount and/or Complexity of Data Reviewed Labs: ordered. Radiology: ordered.  Risk OTC drugs. Prescription drug management. Decision regarding hospitalization.   Patient is a 57 year old who initially presented with episodes of headache and dizziness since November.  She also was having some intermittent spasms of her hands and dropping things although currently she does not have any weakness or acute neurologic deficits.  No visual field deficits.  She had an MRI which showed findings potentially with artifact but could represent some other nonspecific etiologies.  I discussed these findings with Dr. Cheral Marker with neurology.  He felt that it likely was artifact but to completely rule out blood, would recommend doing a head CT.  This was done and was negative.  It was discussed that if that was negative, patient can follow-up outpatient with neurology.  No acute stroke was noted.  No masses.  While patient was in the ED, she was noted to get hypoxic at times.  Even with minimal exertion, she would drop her oxygenation's down into the mid 80s and sometimes into the 70s.  Chest x-ray was performed which shows evidence of some vascular  congestion.  This was interpreted by me and confirmed by the radiologist.  Her BNP is elevated.  She was given a dose of Lasix.  I feel that she would benefit from admission for diuresis.  Her oxygen was turned up to 3 L/min.  I spoke with the resident  with the internal medicine teaching service who will admit the patient for further treatment.  Final Clinical Impression(s) / ED Diagnoses Final diagnoses:  Nonintractable episodic headache, unspecified headache type  Dizziness  Shortness of breath  Hypoxia    Rx / DC Orders ED Discharge Orders     None         Malvin Johns, MD 12/02/22 1512

## 2022-12-02 NOTE — ED Notes (Signed)
Pt returns from MRI  

## 2022-12-02 NOTE — ED Triage Notes (Addendum)
Feeling off balance for the past month. Headaches on and off. Dizziness. Vision changes. Pt states she drops things.

## 2022-12-02 NOTE — ED Provider Triage Note (Signed)
Emergency Medicine Provider Triage Evaluation Note  Claudia [name] , a 57 y.o. female  was evaluated in triage.  Pt complains of headache and dizziness.  She reports feeling intermittent headaches and dizziness since November with issues dropping things.  Symptoms worsened overnight and she presents for evaluation.  Review of Systems  Positive: Headache and dizziness Negative: Fevers, night sweats  Physical Exam  Temp 98.4 F (36.9 C) (Oral)   Ht '5\' 8"'$  (1.727 m)   Wt 119.7 kg   LMP 05/09/2012   BMI 40.14 kg/m  Gen:   Awake, no distress   Resp:  Normal effort  MSK:   Moves extremities without difficulty  Other:  No asymmetry of facial movements.  5 out of 5 grip strength in bilateral upper extremities  Medical Decision Making  Medically screening exam initiated at 6:41 AM.  Appropriate orders placed.  Claudia [name] was informed that the remainder of the evaluation will be completed by another provider, this initial triage assessment does not replace that evaluation, and the importance of remaining in the ED until their evaluation is complete.     Claudia Reichert, MD 12/02/22 [phone]

## 2022-12-02 NOTE — H&P (Signed)
Date: 12/02/2022               Patient Name:  Claudia [name] MRN: [id number]  DOB: [birth year] Age / Sex: 57 y.o., female   PCP: Claudia Foy, NP         Medical Service: Internal Medicine Teaching Service         Attending Physician: Dr. Charise Killian, MD    First Contact: Dr. Claudia Desanctis Emaleigh [name] Pager: [phone]  Second Contact: Dr. Delene [name] Pager: [phone]       After Hours (After 5p/  First Contact Pager: [phone]  weekends / holidays): Second Contact Pager: [phone]   Chief Complaint: SOB   History of Present Illness:  Claudia [name] is a 57 year old female with a past medical history significant for HTN, HLD, CAD, NSTEMI, pSVT, HFpEF 50-55%, obesity, poorly controlled T2DM, ACS, IDA, C5-C6 ACDF, OA, chronic oxygen use (2L) who presents to the ED for shortness of breath.  Patient reports gradual worsening of exertional dyspnea since her hospitalization in 07/2022 for CHF exacerbation and pneumonia. Patient reports that she has been compliant with the medications she was discharged on. She reports using 2 L oxygen at home throughout the day and at night.  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  Therefore, she does not wear her oxygen at work but goes home and puts on her oxygen immediately.   Patient endorses daytime fatigue and snoring. Also endorses intermittent palpitations that worsen with activity, however she is not experiencing palpitations at this time. Endorses intermittent cough over the last few days productive of clear sputum. She reports that she underwent a sleep study last month and is currently waiting for CPAP to be delivered.  Patient also reports intermittent dizziness that began around 06/2022. Patient states that dizziness worsens with standing, occasionally occurred at rest, and describes this as a room spinning sensation.  She states that this sensation of dizziness has caused her to  fall several times.  She reports that she has noticed abnormal muscle twitches beginning around this time which frequently causes her to drop things.  She describes these movements as an uncontrollable and does not note any transforming factors.   Patient also notes occasional "floaters" in the vision of her right eye with flashes of color. Patient reports difficulty hearing out of both ears over the last month and states that things sound more quiet than usual. She endorses cleaning out her ears regularly with hydrogen peroxide.   Patient denies fever, chills, sore throat, chest pain, syncope, nausea vomiting, diarrhea, abdominal pain, constipation, hematochezia, dysuria, hematuria, aspiration, leg swelling, extremity pain, other visual changes, numbness, tingling, isolated weakness, or seizure like activity. Denies recent illness. Denies recent long car trips/flights and does not miss doses of her medications. Denies history of COPD, asthma, stroke, or cancer.   ED Course:  Labs: -CMP significant for elevated CO2 at 33, elevated AST 48, elevated ALT 47 -CBC significant for elevated RBC at 6.61, decreased MCV at 76, Hgb WNL -Mg WNL -BNP elevated at 386.6 -UA significant for glucose > 500, protein of 100, and elevated specific gravity  Imaging: - CXR significant for cardiomegaly w/ vascular congestion and possible mild pulmonary interstitial edema -CT head w/o contrast negative for acute intracranial abnormalities -MRI brain w/o contrast negative for acute abnormality, diffuse superficial signal favored to represent artifact vs superficial siderosis in setting of chronic CSF  leak  Interventions: Patient was given tylenol 1000 mg, IV lasix 60 mg, and NS 0.9% 500 mL bolus. Patient developed dizziness w/ O2 sat dropping to 80s while ambulation on home 2L.   Review of Systems: A complete ROS was negative except as per HPI.   Meds:  -Tessalon Perles100 mg 3 times daily as needed -Tylenol 500 mg  every 6 hours as needed -Amlodipine 5 mg daily -Entresto 49-51 mg twice daily -Spironolactone 25 mg daily -Hydralazine 25 mg every 8 hours -Lasix 40 mg daily -Aspirin 81 mg daily -Farxiga 10 mg daily -Glipizide 10 mg twice daily -Metformin 1000 mg BID -Semaglutide 0.5 mg once weekly -Ferrous sulfate 325 mg daily -Omega 3 fatty acids  Allergies: Allergies as of 12/02/2022 - Review Complete 12/02/2022  Allergen Reaction Noted   Beef-derived products  01/20/2022   Crestor [rosuvastatin]  03/19/2014   Diltiazem Hives 07/20/2022   Hydrocodone Itching 08/31/2013   Other Hives, Swelling, and Other (See Comments) 08/31/2013   Oxycodone Itching 08/31/2013   Pork-derived products  01/20/2022   Pravachol [pravastatin sodium] Itching 06/01/2012   Simvastatin Hives 09/25/2013   Past Medical History:  Diagnosis Date   Diabetes mellitus    Fibroids    Headache(784.0)    History of echocardiogram    Echo 7/18: Moderate concentric LVH, EF 55-60, normal wall motion, grade 1 diastolic dysfunction, calcified aortic leaflets   Hypercholesterolemia    Hyperlipidemia    Hypertension    Osteoarthritis    , with mild meniscus tear, followed by orthopedics--Dr Claudia [name] 04/29/10   Right knee pain    Seizures ([place])    as child   STEMI (ST elevation myocardial infarction) ([place])    Vitamin D deficiency    Family History: CHF (sister), DM (mother, 2x sisters). Denies family history of MI, stroke, cancer.   Social History: works in Occupational psychologist at [employer], lives with husband and children, was independent of all ADLs/IADLs prior to symptom development, ambulates without assistance, denies current or prior alcohol, tobacco, or recreational drug use.    Physical Exam: Blood pressure 136/79, pulse 86, temperature 98.1 F (36.7 C), temperature source Oral, resp. rate 11, height '5\' 8"'$  (1.727 m), weight 119.7 kg, last menstrual period 05/09/2012, SpO2 (!) 88 %. Constitutional:  Well-developed, well-nourished, appears uncomfortable  HENT: Normocephalic and atraumatic. Moderate amount of cerumen in bilateral ear canals w/ normal TM bilaterally. No jaundice of the lingual frenulum.  Eyes: EOMI. PERRL. Minimal scleral icterus bilaterally.  Neck: Normal range of motion.  Cardiovascular: Regular rate, regular rhythm. No murmurs, rubs, or gallops. Normal radial and PT pulses bilaterally. No LE edema. No JVD appreciated.  Pulmonary: Normal respiratory effort on 3L. No wheezes, rales, or rhonchi. Minimal crackles at the lung bases bilaterally.  Abdominal: Soft. Non-distended. No tenderness. Normal bowel sounds.  Musculoskeletal: Normal range of motion.     Neurological: Alert and oriented to person, place, and time. Normal strength and sensation of all extremities. Normal finger-to-nose bilaterally.  Skin: warm and dry. Chronic dry skin changes of bilateral lower extremities. No jaundice. No palmar erythema. No telangiectasia. Darkening of the skin folds most notable in the neck region.   EKG: personally reviewed my interpretation is sinus rhythm, RAD, prolonged QT  CXR: personally reviewed my interpretation is cardiomegaly w/ vascular congestion and possible mild pulmonary interstitial edema  Assessment & Plan by Problem: Principal Problem:   CHF (congestive heart failure) ([place])  Quiara L. Labo is a 57 year old female with a past medical history significant  for HTN, HLD, CAD, NSTEMI, pSVT, HFpEF 50-55%, obesity, poorly controlled T2DM, ACS, IDA, C5-C6 ACDF, OA, chronic oxygen use (2L) who presents to the ED for shortness of breath and was admitted for CHF exacerbation.   #HFpEF (EF 55-60%) #Chronic oxygen use (2L) #Hx of OSA #HTN Patient taking amlodipine 5 mg daily, entresto 49-51 mg BID, spironolactone 25 mg daily, hydralazine 25 mg q8 hours, and lasix 40 mg daily at home. Presented with several months of worsening exertional dyspnea. Developed dizziness w/ O2 sat  dropping to 80s while ambulation on home 2L while in the ED. Echo from 07/2022 w/ EF 55-60% w/ mild LVH and moderately reduced RV systolic function. CXR on admission significant for cardiomegaly w/ vascular congestion and possible mild pulmonary interstitial edema. BNP elevated at 386. EKG demonstrates sinus rhythm, RAD, and prolonged QT. On exam, patient does not have lower extremity swelling or signs of JVD.  Does have minimal crackles in bilateral lung bases.  Suspect patient's symptoms may be secondary to HFpEF exacerbation. Also given her recent sleep study demonstrating need for CPAP, obesity, symptoms of OSA at home, polycythemia, and RV dysfunction, it is possible untreated OSA is contributing to her symptoms. She received IV Lasix 60 mg while in the ED. Will monitor urine output and reassess volume status/symptoms.  Patient may require further diuresis tomorrow.  -Continue home Amlodipine 5 mg daily -Continue home Entresto 49-51 mg twice daily -Continue home Spironolactone 25 mg daily -Monitor telemetry -Orthostatic vitals -Daily weights -Strict Is/Os -CPAP QHS   #Dizziness #Vision Changes #Hearing Difficulty Reports several months of intermittent dizziness, floaters in her vision with flashes of color, uncontrollable twitching of her upper and lower extremities, and more recently decreased hearing from both the ears. CT head w/o contrast negative for acute intracranial abnormalities. MRI brain w/o contrast negative for acute abnormality, notes diffuse superficial signal favored to represent artifact vs superficial siderosis in setting of chronic CSF leak. No neurological deficits noted on exam. TMs are normal bilaterally with moderate cerumen within the ear canal.  Vision changes may be secondary to hyperglycemia.  Dizziness may be related to CHF/OSA. However, it is uncertain what is causing her neurological symptoms at this time. Suspect patient would benefit from vestibular rehab vs  outpatient EMG.  #T2DM #Obesity History of poorly controlled diabetes. Most recent A1c 9.3% in 07/2022. Taking farxiga 10 mg daily, glipizide 10 mg BID, metformin 1000 mg BID, and semaglutide 0.5 mg weekly at home. On admission, UA significant for glucose > 500. CBGs remained WNL. -Continue farxiga 10 mg daily -Continue metformin 1000 mg BID -Consider starting SSI if CBGs worsen -Trend CBGs -Pending A1c  #Transaminitis  CMP significant for elevated AST 48, elevated ALT 47.  These values were normal 1 month ago.  Has mild scleral icterus bilaterally on exam, but no jaundice.  Denies IV drug use.  No alcohol use.  Hepatitis C screening from 3 months ago was negative. Hepatitis A IgM and Hepatitis B Eab negative at this time as well. Potentially secondary to steatohepatitis in the setting of obesity or from vascular congestion in the setting of CHF. -Trend CMP  #Hx of ACS #Hx of NSTEMI #CAD #HLD Taking aspirin 81 mg daily. Prior allergy to pravastatin, rosuvastatin, and atorvastatin. Obtaining lipid panel. Could consider starting zetia.  -Aspirin 81 mg daily -Pending lipid panel  #IDA Iron panel from 3 months ago with normal iron, elevated TIBC at 557, and decreased saturation ratios.  Ferritin was decreased at 9. Taking Ferrous sulfate  325 mg daily at home.  -Continue Ferrous sulfate 325 mg daily -Trend CBC  #OA #Hx of C5-C6 ACDF Not complaining of pain at this time.  Will treat with as needed Tylenol and reassess for worsening of symptoms. -Tylenol PRN   Diet:  HH/CM Bowel: miralax VTE: lovenox IVF: none Code: Full PT/OT recs: pending   Prior to Admission Living Arrangement: home w/ husband and children Anticipated Discharge Location: TBD Barriers to Discharge: continued management  Dispo: Admit patient to Inpatient with expected length of stay greater than 2 midnights.  Signed: Starlyn Skeans, MD 12/02/2022, 6:54 PM  Pager: [phone] After 5pm on weekdays and 1pm on  weekends: On Call pager: [phone]

## 2022-12-02 NOTE — ED Notes (Signed)
Pt was able to walk approximately 25 feet with a steady gait.  She did experience some dizziness at the end but it did not affect her gait. Her o2 did drop to 84% while on 2lnc.

## 2022-12-02 NOTE — Hospital Course (Addendum)
*She has recurrent PSVT has been using Valsalva maneuvers for recurrent symptoms.  Could not tolerate metoprolol or diltiazem.  Diltiazem caused hives and itching.  Be able to use beta-blocker as needed for sustained symptoms.* ------------------------------------------------ Claudia Rima. [name] is a 57 year old female with a past medical history significant for HTN, HLD, CAD, NSTEMI, pSVT, HFpEF 50-55%, obesity, poorly controlled T2DM, ACS, IDA, C5-C6 ACDF, OA, chronic oxygen use (2L) who presents to the ED for shortness of breath and was admitted for CHF exacerbation.    #HFpEF (EF 55-60%) #Chronic oxygen use (2L) #Hx of OSA #HTN Presented exertional dyspnea for several months. Desaturated in the ED w/ ambulation. Home medications include amlodipine 5 mg daily, entresto 49-51 mg BID, spironolactone 25 mg daily, hydralazine 25 mg q8 hours, and lasix 40 mg daily. EF 55-60% w/ mild LVH and moderately reduced RV systolic function on echo from 07/2022. CXR w/ cardiomegaly, vascular congestion, and possible mild pulmonary interstitial edema. BNP elevated on admission. EKG w/o signs of ischemia. Suspect symptoms 2/2 combination of HFpEF and untreated OSA.  Received IV Lasix 60 mg in the ED w/ only 300 mL UOP charted. Desatured overnight on 2L O2 and while on CPAP, transitioned to Bipap and satting well this morning on 4L w/ FiO2 45%. On exam, has increased work of breathing but not somnolent.  pCO2 103 this morning which improved to 67.9 after BiPAP.  CTA negative for PE but demonstrates cardiomegaly with new small pericardial effusion, mild bibasilar opacities suspicious for atelectasis, mild mosaic attenuation suspicious for small airway disease versus pulmonary hypertension, dilated main pulmonary artery. Will take off bipap for 2 hours and reassess ABG. If pCO2 worsening, will have to remain on Bipap. Will need to be NPO while on Bipap during the day to prevent aspiration. Will continue with diuresis today.   -Continue home Amlodipine 5 mg daily (hold if NPO) -Continue home Entresto 49-51 mg twice daily (hold if NPO) -Continue home Spironolactone 25 mg daily (hold if NPO) -IV lasix 60 mg BID -Pending echo -Monitor telemetry -Orthostatic vitals -Daily weights -Strict Is/Os -Bipap QHS   #Dizziness #Vision Changes #Hearing Difficulty Presented w/ several months of intermittent dizziness, floaters in her vision w/ flashes of color, uncontrollable twitching of UE/LE, more recently decreased hearing from both the ears. CT head and MRI brain negative. Neuro exam remains normal. Suspect vision changes may be 2/2 hyperglycemia associated w/ T2DM. Dizziness may be 2/2 CHF/OSA. Cause of her neurological symptoms remain unknown at this time. Suspect patient would benefit from vestibular rehab vs outpatient EMG. Will perform dix-hallpike to assess for BPPV following improvement of respiratory status.    #T2DM #Obesity Hx of poorly controlled diabetes. A1c 9.3% in 07/2022. Taking farxiga 10 mg daily, glipizide 10 mg BID, metformin 1000 mg BID, and semaglutide 0.5 mg weekly at home. CBGs remain WNL. -Continue farxiga 10 mg daily (hold if NPO) -Continue metformin 1000 mg BID (hold if NPO) -Consider starting SSI if CBGs worsen -Trend CBGs -Pending A1c   #Transaminitis  Elevated AST 48, ALT 47 on admission. Was noted to have mild scleral icterus bilaterally w/o jaundice. Denied IV drug use.  No alcohol use. Recent hepatitis C screening negative. Hepatitis A IgM and Hepatitis B Eab negative at that time. Considered steatohepatitis vs vascular congestion in setting of CHF. AST/ALT normalized today.    #Hx of ACS #Hx of NSTEMI #CAD #HLD On aspirin 81 mg daily. Has prior allergies to pravastatin, rosuvastatin, and atorvastatin. Lipid panel here WNL w/ exception of  LDL 140. Could consider starting zetia.  -Aspirin 81 mg daily (hold if NPO)   #IDA Iron panel from 3 months ago w/ normal iron, elevated TIBC  at 557, and decreased saturation ratios. Ferritin was decreased at 9. On Ferrous sulfate 325 mg daily at home.  -Trend CBC   #OA #Hx of C5-C6 ACDF No pain at this time.  -Tylenol PRN  ------------------------------------------ 3/7: Denies chest pain. She feels slightly tired this morning but otherwise denies other concerns. 3/11: Enjoyed breakfast today. Felt dizzy, breathless on turning O2 down to 2 L/min. Amenable to plan for physical therapy. Feels comfortable leaving today on 3 L/min, but doesn't want to return to the hospital.

## 2022-12-03 ENCOUNTER — Other Ambulatory Visit (HOSPITAL_COMMUNITY): Payer: BLUE CROSS/BLUE SHIELD

## 2022-12-03 ENCOUNTER — Inpatient Hospital Stay (HOSPITAL_COMMUNITY): Payer: BLUE CROSS/BLUE SHIELD

## 2022-12-03 DIAGNOSIS — I110 Hypertensive heart disease with heart failure: Secondary | ICD-10-CM

## 2022-12-03 DIAGNOSIS — I5030 Unspecified diastolic (congestive) heart failure: Secondary | ICD-10-CM

## 2022-12-03 LAB — POCT I-STAT 7, (LYTES, BLD GAS, ICA,H+H)
Acid-Base Excess: 12 mmol/L — ABNORMAL HIGH (ref 0.0–2.0)
Acid-Base Excess: 13 mmol/L — ABNORMAL HIGH (ref 0.0–2.0)
Bicarbonate: 41.4 mmol/L — ABNORMAL HIGH (ref 20.0–28.0)
Bicarbonate: 44.2 mmol/L — ABNORMAL HIGH (ref 20.0–28.0)
Calcium, Ion: 1.15 mmol/L (ref 1.15–1.40)
Calcium, Ion: 1.19 mmol/L (ref 1.15–1.40)
HCT: 50 % — ABNORMAL HIGH (ref 36.0–46.0)
HCT: 49 % — ABNORMAL HIGH (ref 36.0–46.0)
Hemoglobin: 17 g/dL — ABNORMAL HIGH (ref 12.0–15.0)
Hemoglobin: 16.7 g/dL — ABNORMAL HIGH (ref 12.0–15.0)
O2 Saturation: 98 %
O2 Saturation: 96 %
Patient temperature: 98
Potassium: 4.2 mmol/L (ref 3.5–5.1)
Potassium: 4.1 mmol/L (ref 3.5–5.1)
Sodium: 137 mmol/L (ref 135–145)
Sodium: 139 mmol/L (ref 135–145)
TCO2: 43 mmol/L — ABNORMAL HIGH (ref 22–32)
TCO2: 47 mmol/L — ABNORMAL HIGH (ref 22–32)
pCO2 arterial: 67.9 mmHg (ref 32–48)
pCO2 arterial: 84.3 mmHg (ref 32–48)
pH, Arterial: 7.393 (ref 7.35–7.45)
pH, Arterial: 7.326 — ABNORMAL LOW (ref 7.35–7.45)
pO2, Arterial: 110 mmHg — ABNORMAL HIGH (ref 83–108)
pO2, Arterial: 91 mmHg (ref 83–108)

## 2022-12-03 LAB — BLOOD GAS, ARTERIAL
Acid-Base Excess: 23.8 mmol/L — ABNORMAL HIGH (ref 0.0–2.0)
Bicarbonate: 52.8 mmol/L — ABNORMAL HIGH (ref 20.0–28.0)
O2 Saturation: 90 %
Patient temperature: 36.7
pCO2 arterial: 75 mmHg (ref 32–48)
pH, Arterial: 7.45 (ref 7.35–7.45)
pO2, Arterial: 57 mmHg — ABNORMAL LOW (ref 83–108)

## 2022-12-03 LAB — CBC
HCT: 52.4 % — ABNORMAL HIGH (ref 36.0–46.0)
Hemoglobin: 14.2 g/dL (ref 12.0–15.0)
MCH: 20.6 pg — ABNORMAL LOW (ref 26.0–34.0)
MCHC: 27.1 g/dL — ABNORMAL LOW (ref 30.0–36.0)
MCV: 76.2 fL — ABNORMAL LOW (ref 80.0–100.0)
Platelets: 329 10*3/uL (ref 150–400)
RBC: 6.88 MIL/uL — ABNORMAL HIGH (ref 3.87–5.11)
RDW: 21.7 % — ABNORMAL HIGH (ref 11.5–15.5)
WBC: 6.2 10*3/uL (ref 4.0–10.5)
nRBC: 0.5 % — ABNORMAL HIGH (ref 0.0–0.2)

## 2022-12-03 LAB — COMPREHENSIVE METABOLIC PANEL
ALT: 40 U/L (ref 0–44)
AST: 34 U/L (ref 15–41)
Albumin: 3.6 g/dL (ref 3.5–5.0)
Alkaline Phosphatase: 44 U/L (ref 38–126)
Anion gap: 4 — ABNORMAL LOW (ref 5–15)
BUN: 8 mg/dL (ref 6–20)
CO2: 39 mmol/L — ABNORMAL HIGH (ref 22–32)
Calcium: 8.6 mg/dL — ABNORMAL LOW (ref 8.9–10.3)
Chloride: 95 mmol/L — ABNORMAL LOW (ref 98–111)
Creatinine, Ser: 0.71 mg/dL (ref 0.44–1.00)
GFR, Estimated: >60 mL/min (ref >60)
Glucose, Bld: 151 mg/dL — ABNORMAL HIGH (ref 70–99)
Potassium: 3.8 mmol/L (ref 3.5–5.1)
Sodium: 138 mmol/L (ref 135–145)
Total Bilirubin: 1 mg/dL (ref 0.3–1.2)
Total Protein: 7.2 g/dL (ref 6.5–8.1)

## 2022-12-03 LAB — BLOOD GAS, VENOUS
Acid-Base Excess: 13.5 mmol/L — ABNORMAL HIGH (ref 0.0–2.0)
Bicarbonate: 45.6 mmol/L — ABNORMAL HIGH (ref 20.0–28.0)
Drawn by: 623441
O2 Saturation: 85.6 %
Patient temperature: 36.9
pCO2, Ven: 103 mmHg (ref 44–60)
pH, Ven: 7.25 (ref 7.25–7.43)
pO2, Ven: 56 mmHg — ABNORMAL HIGH (ref 32–45)

## 2022-12-03 LAB — RETICULOCYTES
Immature Retic Fract: 31.7 % — ABNORMAL HIGH (ref 2.3–15.9)
RBC.: 6.78 MIL/uL — ABNORMAL HIGH (ref 3.87–5.11)
Retic Count, Absolute: 208.8 10*3/uL — ABNORMAL HIGH (ref 19.0–186.0)
Retic Ct Pct: 3.1 % (ref 0.4–3.1)

## 2022-12-03 LAB — GLUCOSE, CAPILLARY
Glucose-Capillary: 150 mg/dL — ABNORMAL HIGH (ref 70–99)
Glucose-Capillary: 165 mg/dL — ABNORMAL HIGH (ref 70–99)

## 2022-12-03 LAB — HEMOGLOBIN A1C
Hgb A1c MFr Bld: 6.9 % — ABNORMAL HIGH (ref 4.8–5.6)
Mean Plasma Glucose: 151 mg/dL

## 2022-12-03 MED ORDER — IOHEXOL 350 MG/ML SOLN
75.0000 mL | Freq: Once | INTRAVENOUS | Status: AC | PRN
  Administered 2022-12-03: 75 mL via INTRAVENOUS

## 2022-12-03 MED ORDER — FUROSEMIDE 10 MG/ML IJ SOLN: 60.0000 mg | Freq: Once | INTRAMUSCULAR | Status: DC

## 2022-12-03 MED ORDER — POTASSIUM CHLORIDE CRYS ER 20 MEQ PO TBCR
40.0000 meq | EXTENDED_RELEASE_TABLET | Freq: Once | ORAL | Status: AC
  Administered 2022-12-03: 40 meq via ORAL
  Filled 2022-12-03: qty 2

## 2022-12-03 MED ORDER — FUROSEMIDE 10 MG/ML IJ SOLN
60.0000 mg | Freq: Two times a day (BID) | INTRAMUSCULAR | Status: DC
  Administered 2022-12-03 – 2022-12-04 (×3): 60 mg via INTRAVENOUS
  Filled 2022-12-03 (×3): qty 6

## 2022-12-03 NOTE — Progress Notes (Signed)
Pt arrived to unit at shift change, A&O x4, pleasant, cooperative, all vitals WNL. As soon as pt fell asleep, pt began bouts of desatting at increasingly lower amounts. Per tele, one time as low as 30%, despite raising pt's O2 from 2L [name] to 4L [name]. Pt rousable, oriented, no complaints of pain or shortness of breath.   Respiratory was called, pt placed on CPAP with 5LNC, and best SpO2 reading was 92%. When pt desatted again into the 50s, MD was called and they came, visualized and spoke with pt. Her SpO2 came back up to low 90s while awake. Per MD, if she desatted again, pt might need Bi-pap. Explained interventions and the reasons for them to the patient, pt is agreeable.   Pt continued to de-stat, MD and respiratory paged again. Per MD, Respiratory currently placing pt on Bi-pap. Will continue to monitor.

## 2022-12-03 NOTE — Progress Notes (Signed)
Heart Failure Navigator Progress Note  Assessed for Heart & Vascular TOC clinic readiness.  Patient does not meet criteria due to Advanced Heart Failure Team patient.   Navigator will sign off at this time.    Madline Oesterling, BSN, RN Heart Failure Nurse Navigator Secure Chat Only   

## 2022-12-03 NOTE — Progress Notes (Signed)
Subjective:   Team was notified by nursing staff that the patient was desaturating into the 60s-70s while on 2 L overnight.  CPAP did not improve desaturation.  Patient was started on BiPAP which initially was not helping with her oxygen saturation but has since improved. Patient currently on BiPAP throughout today.  Patient has been feeling short of breath but denies chest pain or palpitations. She had a posterior headache today that resolved with Tylenol and heating pad.  Discussed plan to continue reassessing her respiratory status today.  Objective:  Vital signs in last 24 hours: Vitals:   12/02/22 2307 12/02/22 2330 12/03/22 0126 12/03/22 0513  BP:  (!) 150/82  (!) 133/90  Pulse:  92  89  Resp: '20 18 16 20  '$ Temp:  98 F (36.7 C)  98 F (36.7 C)  TempSrc:  Axillary  Axillary  SpO2: 92% 90% 94% 97%  Weight:    117.3 kg  Height:       Physical Exam: Constitutional: Well-developed, well-nourished, appears uncomfortable  Neck: Normal range of motion.  Cardiovascular: Regular rate, regular rhythm. No murmurs, rubs, or gallops. Normal radial and PT pulses bilaterally. No LE edema. JVD 1-2 cm above the sternal angle.  Pulmonary: Mildly increased work of breathing on Bipap. No wheezes, rales, or rhonchi. Minimal crackles at the lung bases bilaterally.  Abdominal: Soft. Non-distended. No tenderness. Normal bowel sounds.  Musculoskeletal: Normal range of motion.     Neurological: Alert and oriented to person, place, and time. Non-focal.   Skin: warm and dry.   Assessment/Plan:  Principal Problem:   CHF (congestive heart failure) ([place])  Claudia [name] is a 57 year old female with a past medical history significant for HTN, HLD, CAD, NSTEMI, pSVT, HFpEF 50-55%, obesity, poorly controlled T2DM, ACS, IDA, C5-C6 ACDF, OA, chronic oxygen use (2L) who presents to the ED for shortness of breath and was admitted for CHF exacerbation.    #HFpEF (EF 55-60%) #Chronic oxygen use (2L) #Hx of  OSA #HTN Presented exertional dyspnea for several months. Desaturated in the ED w/ ambulation. Home medications include amlodipine 5 mg daily, entresto 49-51 mg BID, spironolactone 25 mg daily, hydralazine 25 mg q8 hours, and lasix 40 mg daily. EF 55-60% w/ mild LVH and moderately reduced RV systolic function on echo from 07/2022. CXR w/ cardiomegaly, vascular congestion, and possible mild pulmonary interstitial edema. BNP elevated on admission. EKG w/o signs of ischemia. Suspect symptoms 2/2 combination of HFpEF and untreated OSA.  Received IV Lasix 60 mg in the ED w/ only 300 mL UOP charted. Desatured overnight on 2L O2 and while on CPAP, transitioned to Bipap and satting well this morning on 4L w/ FiO2 45%. On exam, has increased work of breathing but not somnolent.  pCO2 103 this morning which improved to 67.9 after BiPAP.  CTA negative for PE but demonstrates cardiomegaly with new small pericardial effusion, mild bibasilar opacities suspicious for atelectasis, mild mosaic attenuation suspicious for small airway disease versus pulmonary hypertension, dilated main pulmonary artery. Will take off bipap for 2 hours and reassess ABG. If pCO2 worsening, will have to remain on Bipap. Will need to be NPO while on Bipap during the day to prevent aspiration. Will continue with diuresis today.  -Continue home Amlodipine 5 mg daily (hold if NPO) -Continue home Entresto 49-51 mg twice daily (hold if NPO) -Continue home Spironolactone 25 mg daily (hold if NPO) -IV lasix 60 mg BID -Pending echo -Monitor telemetry -Orthostatic vitals -Daily weights -Strict Is/Os -Bipap  QHS   #Dizziness #Vision Changes #Hearing Difficulty Presented w/ several months of intermittent dizziness, floaters in her vision w/ flashes of color, uncontrollable twitching of UE/LE, more recently decreased hearing from both the ears. CT head and MRI brain negative. Neuro exam remains normal. Suspect vision changes may be 2/2 hyperglycemia  associated w/ T2DM. Dizziness may be 2/2 CHF/OSA. Cause of her neurological symptoms remain unknown at this time. Suspect patient would benefit from vestibular rehab vs outpatient EMG. Will perform dix-hallpike to assess for BPPV following improvement of respiratory status.    #T2DM #Obesity Hx of poorly controlled diabetes. A1c 9.3% in 07/2022. Taking farxiga 10 mg daily, glipizide 10 mg BID, metformin 1000 mg BID, and semaglutide 0.5 mg weekly at home. CBGs remain WNL. -Continue farxiga 10 mg daily (hold if NPO) -Continue metformin 1000 mg BID (hold if NPO) -Consider starting SSI if CBGs worsen -Trend CBGs -Pending A1c   #Transaminitis  Elevated AST 48, ALT 47 on admission. Was noted to have mild scleral icterus bilaterally w/o jaundice. Denied IV drug use.  No alcohol use. Recent hepatitis C screening negative. Hepatitis A IgM and Hepatitis B Eab negative at that time. Considered steatohepatitis vs vascular congestion in setting of CHF. AST/ALT normalized today.    #Hx of ACS #Hx of NSTEMI #CAD #HLD On aspirin 81 mg daily. Has prior allergies to pravastatin, rosuvastatin, and atorvastatin. Lipid panel here WNL w/ exception of LDL 140. Could consider starting zetia.  -Aspirin 81 mg daily (hold if NPO)   #IDA Iron panel from 3 months ago w/ normal iron, elevated TIBC at 557, and decreased saturation ratios. Ferritin was decreased at 9. On Ferrous sulfate 325 mg daily at home.  -Trend CBC   #OA #Hx of C5-C6 ACDF No pain at this time.  -Tylenol PRN     Diet: NPO while on Bipap Bowel: miralax VTE: lovenox IVF: none Code: Full PT/OT recs: pending     Prior to Admission Living Arrangement: home w/ husband and children Anticipated Discharge Location: TBD Barriers to Discharge: continued management Dispo: Anticipated discharge in approximately more than 2 day(s).   Starlyn Skeans, MD 12/03/2022, 6:31 AM Pager: [phone] After 5pm on weekdays and 1pm on weekends: On Call pager  [phone]

## 2022-12-03 NOTE — Progress Notes (Addendum)
Bipap removed by RT. Placed on 6L O2 via [place].

## 2022-12-03 NOTE — Progress Notes (Signed)
PT Cancellation Note  Patient Details Name: Claudia [name] MRN: [id number] DOB: 02-Dec-1965   Cancelled Treatment:    Reason Eval/Treat Not Completed: Medical issues which prohibited therapy (RN notified PT that pt was placed back on Bipap and CO2 is currently 103. Instructed PT to hold for today)   Viann Shove 12/03/2022, 11:20 AM

## 2022-12-03 NOTE — Progress Notes (Addendum)
IMTS paged by RN for concern for hypoxia. When patient fell asleep, noted to have episodes of desaturation with SpO2 of 60-70% on 2L [place]. Respiratory called and patient placed on CPAP and 5L [place]. Patient continued to desaturate into 60s for couple minutes then return to 90% without any other interventions. Asymptomatic as patient denies any shortness of breath, pain or discomfort.   On arrival, vitals showed BP 150/82, HR 90-100, RR 18, SpO2 drops to 60-70s with adequate pleth, then improves to upper 80 to low 90 while on CPAP and 5L, no other interventions. Patient sleeping initially but can be awaken easily. Does not appear in any acute distress. No respiratory distress or signs of labored breathing.   Paged by RN after initial evaluation about further episodic desaturation into 50s despite on CPAP and 5L. Notify respiratory about starting BiPAP.  Hypoxia --Patient has sudden drops in SpO2. Remains asymptomatic. RN has replaced pulse ox sensor. CPAP is secured.  --Start patient on BiPAP

## 2022-12-03 NOTE — Progress Notes (Signed)
Bi-pap started by RT

## 2022-12-03 NOTE — Plan of Care (Signed)

## 2022-12-03 NOTE — TOC Progression Note (Signed)
Transition of Care [facility]) - Progression Note    Patient Details  Name: Claudia [name] MRN: [id number] Date of Birth: 23-May-1966  Transition of Care [facility]) CM/SW Contact  Zenon Mayo, RN Phone Number: 12/03/2022, 10:50 AM  Clinical Narrative:     from home with spouse and her daughter, CHF ex, sats dropping, on bipap last pm.  TOC following.       Expected Discharge Plan and Services                                               Social Determinants of Health (SDOH) Interventions SDOH Screenings   Food Insecurity: No Food Insecurity (12/02/2022)  Housing: Low Risk  (12/02/2022)  Transportation Needs: No Transportation Needs (12/02/2022)  Utilities: Not At Risk (12/02/2022)  Alcohol Screen: Low Risk  (08/14/2022)  Depression (PHQ2-9): Low Risk  (02/03/2022)  Financial Resource Strain: Medium Risk (08/14/2022)  Tobacco Use: Low Risk  (12/02/2022)    Readmission Risk Interventions     No data to display

## 2022-12-03 NOTE — Progress Notes (Signed)
Contacted MD to report critical lab value of pCO2 of 103 at 0930 this morning.  MD states to put patient back on BiPAP, which was completed immediately.  At 1025, contacted MD to report that patient is complaining of headache that is not improved with Tylenol.

## 2022-12-03 NOTE — Evaluation (Signed)
Occupational Therapy Evaluation Patient Details Name: Claudia [name] MRN: [id number] DOB: 01-13-1966 Today's Date: 12/03/2022   History of Present Illness 57 year old female adm 3/6 with a past medical history significant for HTN, HLD, CAD, NSTEMI, pSVT, HFpEF 50-55%, obesity, poorly controlled T2DM, ACS, IDA, C5-C6 ACDF, OA, chronic oxygen use (2L) who presents to the ED for shortness of breath.  Patient also experiencing dizziness and headache.   Clinical Impression   Patient admitted for the diagnosis above.  PTA she lives at home with her spouse, who does assist as needed with iADL, ADL, and community mobility.  Patient continues to try and work, but may look to apply for disability in the near future due to increasing O2 needs.  OT to follow in the acute setting to address the deficits listed below, and to assist with eventual transition home.  No post acute OT is indicated.         Recommendations for follow up therapy are one component of a multi-disciplinary discharge planning process, led by the attending physician.  Recommendations may be updated based on patient status, additional functional criteria and insurance authorization.   Follow Up Recommendations  No OT follow up     Assistance Recommended at Discharge Set up Supervision/Assistance  Patient can return home with the following Assist for transportation;Assistance with cooking/housework    Functional Status Assessment  Patient has had a recent decline in their functional status and demonstrates the ability to make significant improvements in function in a reasonable and predictable amount of time.  Equipment Recommendations  Tub/shower seat    Recommendations for Other Services       Precautions / Restrictions Precautions Precautions: Fall Precaution Comments: O2 sats and HR Restrictions Weight Bearing Restrictions: No      Mobility Bed Mobility Overal bed mobility: Needs Assistance Bed Mobility: Supine to  Sit     Supine to sit: Supervision       Patient Response: Cooperative  Transfers Overall transfer level: Needs assistance Equipment used: None Transfers: Sit to/from Stand, Bed to chair/wheelchair/BSC Sit to Stand: Supervision     Step pivot transfers: Min guard            Balance Overall balance assessment: Needs assistance Sitting-balance support: Feet supported Sitting balance-Leahy Scale: Good     Standing balance support: No upper extremity supported Standing balance-Leahy Scale: Fair                             ADL either performed or assessed with clinical judgement   ADL       Grooming: Wash/dry hands;Wash/dry face;Set up;Sitting               Lower Body Dressing: Min guard;Sit to/from stand   Toilet Transfer: Nature conservation officer;Ambulation                   Vision Baseline Vision/History: 1 Wears glasses Patient Visual Report: No change from baseline       Perception     Praxis      Pertinent Vitals/Pain Pain Assessment Pain Assessment: Faces Faces Pain Scale: Hurts little more Pain Location: Headache Pain Descriptors / Indicators: Aching, Constant, Pressure Pain Intervention(s): Monitored during session, Patient requesting pain meds-RN notified     Hand Dominance Right   Extremity/Trunk Assessment Upper Extremity Assessment Upper Extremity Assessment: Overall WFL for tasks assessed   Lower Extremity Assessment Lower Extremity Assessment: Defer to PT evaluation  Cervical / Trunk Assessment Cervical / Trunk Assessment: Normal   Communication Communication Communication: No difficulties   Cognition Arousal/Alertness: Awake/alert Behavior During Therapy: WFL for tasks assessed/performed Overall Cognitive Status: Within Functional Limits for tasks assessed                                       General Comments   O2 93% in 5L and HR consistent at 85    Exercises     Shoulder  Instructions      Home Living Family/patient expects to be discharged to:: Private residence Living Arrangements: Spouse/significant other Available Help at Discharge: Family;Available 24 hours/day Type of Home: House Home Access: Stairs to enter [employer] of Steps: 4 to the front and 2 in the back.  Enters the home from the back stairs.  No rail.   Home Layout: One level     Bathroom Shower/Tub: Teacher, early years/pre: Standard Bathroom Accessibility: Yes How Accessible: Accessible via walker Home Equipment: Rolling Walker (2 wheels)          Prior Functioning/Environment Prior Level of Function : Independent/Modified Independent;Working/employed             Mobility Comments: Generally walks without an AD.  Occasional use of RW depending on dizziness. ADLs Comments: Spouse assists with iADL, community mobility, and patient cares for her own ADL.        OT Problem List: Impaired balance (sitting and/or standing);Pain      OT Treatment/Interventions: Self-care/ADL training;Therapeutic activities;Patient/family education;Balance training;DME and/or AE instruction    OT Goals(Current goals can be found in the care plan section) Acute Rehab OT Goals Patient Stated Goal: Return home OT Goal Formulation: With patient Time For Goal Achievement: 12/17/22 Potential to Achieve Goals: Good ADL Goals Pt Will Perform Grooming: with modified independence;standing Pt Will Perform Lower Body Dressing: with modified independence;sit to/from stand Pt Will Transfer to Toilet: with modified independence;ambulating;regular height toilet  OT Frequency: Min 2X/week    Co-evaluation              AM-PAC OT "6 Clicks" Daily Activity     Outcome Measure Help from another person eating meals?: None Help from another person taking care of personal grooming?: A Little Help from another person toileting, which includes using toliet, bedpan, or urinal?: A  Little Help from another person bathing (including washing, rinsing, drying)?: A Little Help from another person to put on and taking off regular upper body clothing?: None Help from another person to put on and taking off regular lower body clothing?: A Little 6 Click Score: 20   End of Session Equipment Utilized During Treatment: Oxygen Nurse Communication: Mobility status  Activity Tolerance: Patient tolerated treatment well Patient left: in chair;with call bell/phone within reach;with family/visitor present  OT Visit Diagnosis: Unsteadiness on feet (R26.81)                Time: [id number] OT Time Calculation (min): 21 min Charges:  OT General Charges $OT Visit: 1 Visit OT Evaluation $OT Eval Moderate Complexity: 1 Mod  12/03/2022  RP, OTR/L  Acute Rehabilitation Services  Office:  [phone]   Metta Clines 12/03/2022, 9:54 AM

## 2022-12-03 NOTE — Progress Notes (Signed)
Contacted MD to report that patient is dropping sats while on Bipap.  Sats staying between 86-90% while asleep. Also contacted [facility], RT.  RT changed patient to 3L O2 via [place].  While awake sats improving slightly into low 90's.  MD came to bedside to assist.  MD states they will enter any needed orders.

## 2022-12-04 ENCOUNTER — Inpatient Hospital Stay (HOSPITAL_COMMUNITY): Payer: BLUE CROSS/BLUE SHIELD

## 2022-12-04 DIAGNOSIS — R718 Other abnormality of red blood cells: Secondary | ICD-10-CM

## 2022-12-04 DIAGNOSIS — I110 Hypertensive heart disease with heart failure: Secondary | ICD-10-CM | POA: Diagnosis not present

## 2022-12-04 DIAGNOSIS — I5031 Acute diastolic (congestive) heart failure: Secondary | ICD-10-CM

## 2022-12-04 DIAGNOSIS — I5020 Unspecified systolic (congestive) heart failure: Secondary | ICD-10-CM

## 2022-12-04 DIAGNOSIS — G4733 Obstructive sleep apnea (adult) (pediatric): Secondary | ICD-10-CM

## 2022-12-04 DIAGNOSIS — I5030 Unspecified diastolic (congestive) heart failure: Secondary | ICD-10-CM | POA: Diagnosis not present

## 2022-12-04 DIAGNOSIS — R7989 Other specified abnormal findings of blood chemistry: Secondary | ICD-10-CM

## 2022-12-04 LAB — ECHOCARDIOGRAM COMPLETE
AR max vel: 3.2 cm2
AV Area VTI: 3.21 cm2
AV Area mean vel: 3.12 cm2
AV Mean grad: 5 mmHg
AV Peak grad: 8.4 mmHg
Ao pk vel: 1.45 m/s
Area-P 1/2: 3.77 cm2
Est EF: 45
Height: 68 in
S' Lateral: 3.7 cm
Weight: 4016 oz

## 2022-12-04 LAB — CBC
HCT: 51.7 % — ABNORMAL HIGH (ref 36.0–46.0)
Hemoglobin: 14.6 g/dL (ref 12.0–15.0)
MCH: 21.3 pg — ABNORMAL LOW (ref 26.0–34.0)
MCHC: 28.2 g/dL — ABNORMAL LOW (ref 30.0–36.0)
MCV: 75.4 fL — ABNORMAL LOW (ref 80.0–100.0)
Platelets: 356 10*3/uL (ref 150–400)
RBC: 6.86 MIL/uL — ABNORMAL HIGH (ref 3.87–5.11)
RDW: 21.4 % — ABNORMAL HIGH (ref 11.5–15.5)
WBC: 4.5 10*3/uL (ref 4.0–10.5)
nRBC: 0 % (ref 0.0–0.2)

## 2022-12-04 LAB — BASIC METABOLIC PANEL
Anion gap: 6 (ref 5–15)
BUN: 8 mg/dL (ref 6–20)
CO2: 43 mmol/L — ABNORMAL HIGH (ref 22–32)
Calcium: 8.6 mg/dL — ABNORMAL LOW (ref 8.9–10.3)
Chloride: 91 mmol/L — ABNORMAL LOW (ref 98–111)
Creatinine, Ser: 0.72 mg/dL (ref 0.44–1.00)
GFR, Estimated: >60 mL/min (ref >60)
Glucose, Bld: 124 mg/dL — ABNORMAL HIGH (ref 70–99)
Potassium: 3.9 mmol/L (ref 3.5–5.1)
Sodium: 140 mmol/L (ref 135–145)

## 2022-12-04 LAB — GLUCOSE, CAPILLARY
Glucose-Capillary: 112 mg/dL — ABNORMAL HIGH (ref 70–99)
Glucose-Capillary: 92 mg/dL (ref 70–99)
Glucose-Capillary: 130 mg/dL — ABNORMAL HIGH (ref 70–99)
Glucose-Capillary: 136 mg/dL — ABNORMAL HIGH (ref 70–99)

## 2022-12-04 LAB — BLOOD GAS, VENOUS
Acid-Base Excess: 23.2 mmol/L — ABNORMAL HIGH (ref 0.0–2.0)
Bicarbonate: 53.2 mmol/L — ABNORMAL HIGH (ref 20.0–28.0)
Drawn by: 58552
O2 Saturation: 74 %
Patient temperature: 36.9
pCO2, Ven: 84 mmHg (ref 44–60)
pH, Ven: 7.41 (ref 7.25–7.43)
pO2, Ven: 40 mmHg (ref 32–45)

## 2022-12-04 MED ORDER — SACUBITRIL-VALSARTAN 49-51 MG PO TABS
1.0000 | ORAL_TABLET | Freq: Two times a day (BID) | ORAL | Status: DC
  Administered 2022-12-04 – 2022-12-07 (×7): 1 via ORAL
  Filled 2022-12-04 (×7): qty 1

## 2022-12-04 MED ORDER — ACETAMINOPHEN 500 MG PO TABS
1000.0000 mg | ORAL_TABLET | Freq: Three times a day (TID) | ORAL | Status: DC | PRN
  Administered 2022-12-04: 1000 mg via ORAL
  Filled 2022-12-04: qty 2

## 2022-12-04 MED ORDER — AMLODIPINE BESYLATE 5 MG PO TABS
5.0000 mg | ORAL_TABLET | Freq: Every day | ORAL | Status: DC
  Administered 2022-12-04 – 2022-12-07 (×4): 5 mg via ORAL
  Filled 2022-12-04 (×4): qty 1

## 2022-12-04 MED ORDER — METFORMIN HCL 500 MG PO TABS: 1000.0000 mg | ORAL_TABLET | Freq: Two times a day (BID) | ORAL | Status: DC

## 2022-12-04 MED ORDER — ORAL CARE MOUTH RINSE: 15.0000 mL | OROMUCOSAL | Status: DC | PRN

## 2022-12-04 MED ORDER — ASPIRIN 81 MG PO TBEC
81.0000 mg | DELAYED_RELEASE_TABLET | Freq: Every day | ORAL | Status: DC
  Administered 2022-12-04 – 2022-12-07 (×4): 81 mg via ORAL
  Filled 2022-12-04 (×4): qty 1

## 2022-12-04 MED ORDER — DAPAGLIFLOZIN PROPANEDIOL 10 MG PO TABS
10.0000 mg | ORAL_TABLET | Freq: Every day | ORAL | Status: DC
  Administered 2022-12-04 – 2022-12-07 (×4): 10 mg via ORAL
  Filled 2022-12-04 (×4): qty 1

## 2022-12-04 MED ORDER — METFORMIN HCL 500 MG PO TABS
1000.0000 mg | ORAL_TABLET | Freq: Two times a day (BID) | ORAL | Status: DC
  Administered 2022-12-05 – 2022-12-07 (×5): 1000 mg via ORAL
  Filled 2022-12-04 (×5): qty 2

## 2022-12-04 MED ORDER — SPIRONOLACTONE 25 MG PO TABS
25.0000 mg | ORAL_TABLET | Freq: Every day | ORAL | Status: DC
  Administered 2022-12-04 – 2022-12-07 (×4): 25 mg via ORAL
  Filled 2022-12-04 (×4): qty 1

## 2022-12-04 NOTE — Progress Notes (Signed)
Occupational Therapy Treatment Patient Details Name: Claudia [name] MRN: [id number] DOB: [birth year] Today's Date: 12/04/2022   History of present illness 57 year old female adm 3/6 with a past medical history significant for HTN, HLD, CAD, NSTEMI, pSVT, HFpEF 50-55%, obesity, poorly controlled T2DM, ACS, IDA, C5-C6 ACDF, OA, chronic oxygen use (2L) who presents to the ED for shortness of breath.  Patient also experiencing dizziness and headache.   OT comments  Pt agreeable to today's OT session, currently on 6L [place] but did not desat during functional ambulation and activities. Pt fatigued after standing at sink to brush teeth and needed rest break in chair to recover. OT reinforced education on rest breaks and energy conservation. Pt completed room level ambulation at Supervision level. Pt to continue with skilled acute OT services to address above deficits and improve functional independence. No post acute OT needs recommended at this time.   Recommendations for follow up therapy are one component of a multi-disciplinary discharge planning process, led by the attending physician.  Recommendations may be updated based on patient status, additional functional criteria and insurance authorization.    Follow Up Recommendations  No OT follow up     Assistance Recommended at Discharge Set up Supervision/Assistance  Patient can return home with the following  Assist for transportation;Assistance with cooking/housework   Equipment Recommendations  Tub/shower seat    Recommendations for Other Services      Precautions / Restrictions Precautions Precautions: Fall Precaution Comments: O2 sats and HR Restrictions Weight Bearing Restrictions: No       Mobility Bed Mobility Overal bed mobility: Needs Assistance       Supine to sit: Supervision          Transfers Overall transfer level: Needs assistance Equipment used: None Transfers: Sit to/from Stand Sit to Stand:  Supervision                 Balance Overall balance assessment: Needs assistance Sitting-balance support: Feet supported, No upper extremity supported Sitting balance-Leahy Scale: Normal     Standing balance support: No upper extremity supported, During functional activity Standing balance-Leahy Scale: Good Standing balance comment: Supervision level, fatigued after ~2 mins standing at sink                           ADL either performed or assessed with clinical judgement   ADL Overall ADL's : Needs assistance/impaired     Grooming: Oral care;Wash/dry face;Set up;Supervision/safety;Standing Grooming Details (indicate cue type and reason): Washed face in sitting, brushed teeth standing at sink. Pt needed rest break after brushing teeth Upper Body Bathing: Supervision/ safety;Sitting                           Functional mobility during ADLs: Supervision/safety (No AD)      Extremity/Trunk Assessment Upper Extremity Assessment Upper Extremity Assessment: Overall WFL for tasks assessed   Lower Extremity Assessment Lower Extremity Assessment: Defer to PT evaluation   Cervical / Trunk Assessment Cervical / Trunk Assessment: Normal    Vision Patient Visual Report: No change from baseline Vision Assessment?: No apparent visual deficits   Perception Perception Perception: Within Functional Limits   Praxis Praxis Praxis: Intact    Cognition Arousal/Alertness: Awake/alert Behavior During Therapy: WFL for tasks assessed/performed Overall Cognitive Status: Within Functional Limits for tasks assessed  Exercises      Shoulder Instructions       General Comments  Pt fatigues easily, continue to progress with activity tolerance as able.     Pertinent Vitals/ Pain       Pain Assessment Pain Assessment: No/denies pain  Home Living                                           Prior Functioning/Environment              Frequency  Min 2X/week        Progress Toward Goals  OT Goals(current goals can now be found in the care plan section)  Progress towards OT goals: Progressing toward goals  Acute Rehab OT Goals Patient Stated Goal: return home OT Goal Formulation: With patient Time For Goal Achievement: 12/17/22 Potential to Achieve Goals: Good  Plan Discharge plan remains appropriate;Frequency remains appropriate    Co-evaluation                 AM-PAC OT "6 Clicks" Daily Activity     Outcome Measure   Help from another person eating meals?: None Help from another person taking care of personal grooming?: A Little Help from another person toileting, which includes using toliet, bedpan, or urinal?: A Little Help from another person bathing (including washing, rinsing, drying)?: A Little Help from another person to put on and taking off regular upper body clothing?: None Help from another person to put on and taking off regular lower body clothing?: A Little 6 Click Score: 20    End of Session Equipment Utilized During Treatment: Oxygen  OT Visit Diagnosis: Unsteadiness on feet (R26.81)   Activity Tolerance Patient tolerated treatment well   Patient Left in chair;with call bell/phone within reach   Nurse Communication Mobility status        Time: 1331-1403 OT Time Calculation (min): 32 min  Charges: OT General Charges $OT Visit: 1 Visit OT Treatments $Self Care/Home Management : 8-22 mins $Therapeutic Activity: 8-22 mins  12/04/2022  AB, OTR/L  Acute Rehabilitation Services  Office: [place] 12/04/2022, 2:25 PM

## 2022-12-04 NOTE — Progress Notes (Signed)
Subjective:   Patient interviewed at bedside today.  She is feeling well overall off of BiPAP.  Denies worsening confusion.  No new complaints at this time.  Denies chest pain or palpitations.  Discussed plan to try to wean off of BiPAP today.  Objective:  Vital signs in last 24 hours: Vitals:   12/03/22 1932 12/03/22 2329 12/03/22 2331 12/04/22 0428  BP: (!) 141/83 128/78  (!) 148/84  Pulse: 79 90  75  Resp: '18 18 16 18  '$ Temp: 98 F (36.7 C) 98 F (36.7 C)  98.2 F (36.8 C)  TempSrc: Oral Oral  Oral  SpO2: 95% 98%  98%  Weight:      Height:       Physical Exam: Constitutional: Well-developed, well-nourished, appears uncomfortable  Cardiovascular: Regular rate, regular rhythm. No murmurs, rubs, or gallops. Normal radial and PT pulses bilaterally. No LE edema. JVD 1-2 cm above the sternal angle.  Pulmonary: Normal work of breathing on 6L [place]. No wheezes, rales, or rhonchi. Minimal crackles at the lung bases bilaterally.  Abdominal: Soft. Non-distended. No tenderness. Normal bowel sounds.  Musculoskeletal: Normal range of motion.     Neurological: Alert and oriented to person, place, and time. Non-focal.   Skin: warm and dry.  Assessment/Plan:  Principal Problem:   CHF (congestive heart failure) ([place])  Jackline L. Knee is a 57 year old female with a past medical history significant for HTN, HLD, CAD, NSTEMI, pSVT, HFpEF 50-55%, obesity, poorly controlled T2DM, ACS, IDA, C5-C6 ACDF, OA, chronic oxygen use (2L) who presents to the ED for shortness of breath and was admitted for CHF exacerbation.    #HFpEF (EF 55-60%) #Chronic oxygen use (2L) #Hx of OSA #HTN Presented w/ worsening exertional dyspnea. Suspect symptoms 2/2 combination of HFpEF and untreated OSA.   Received IV lasix 60 mg BID yesterday w/ 1 L UOP. Intermittent worsening of respiratory status yesterday with retention of CO2 on ABG.  Patient has been off BiPAP since this morning.  Does have CO2 retention on VBG,  however, no confusion or altered mental status.  Suspect that patient may have chronically elevated CO2 given her history of OSA.  Bicarb is higher than would expect, consistent with coexisting metabolic alkalosis.  Suspect that diuretic use may be causing this metabolic alkalosis. Will hold off on diuresis today and reassess VBG tomorrow. Plan for CPAP QHS.  Echo w/ reduced EF at 45% w/ new regional wall abnormalities and G1DD. Cardiology consulted and will see the patient. -Appreciate cardiology recommendations -Continue home Amlodipine 5 mg daily  -Continue home Entresto 49-51 mg twice daily  -Continue home Spironolactone 25 mg daily  -Monitor telemetry -Orthostatic vitals -Daily weights -Strict Is/Os -CPAP QHS   #Dizziness #Vision Changes #Hearing Difficulty Presented w/ several months of intermittent dizziness, floaters in her vision w/ flashes of color, uncontrollable twitching of UE/LE, more recently decreased hearing from both the ears. CT head negative. MRI brain negative for acute abnormality but has diffuse superficial signal abnormality favored to represent artifact over chronic CSF leak. Recommended repeat MRI w/ and w/o contrast. Neuro exam remains normal. Will perform dix-hallpike to assess for BPPV once respiratory status has improved. -Possible vestibular rehab as outpatient  -Consider repeat MRI brain   #T2DM #Obesity A1c 6.9% here. On farxiga 10 mg daily, glipizide 10 mg BID, metformin 1000 mg BID, and semaglutide 0.5 mg weekly at home. CBGs remain WNL. -Continue farxiga 10 mg daily  -Continue metformin 1000 mg BID  -Consider starting SSI if CBGs  worsen -Trend CBGs   #Transaminitis  Elevated AST 48, ALT 47 on admission. AST/ALT normalized.   #Hx of ACS #Hx of NSTEMI #CAD #HLD Taking aspirin 81 mg daily. Has prior allergies to pravastatin, rosuvastatin, and atorvastatin. Lipid panel w/ exception of LDL 140. Could consider starting zetia at discharge.  -Aspirin 81  mg daily    #IDA Iron panel from 3 months ago w/ normal iron, elevated TIBC at 557, and decreased saturation ratios. Ferritin was decreased at 9. On Ferrous sulfate 325 mg daily at home.  -Trend CBC   #OA #Hx of C5-C6 ACDF No pain at this time.  -Tylenol PRN     Diet: NPO while on Bipap Bowel: miralax VTE: lovenox IVF: none Code: Full PT/OT recs: No OT f/u, pending PT     Prior to Admission Living Arrangement: home w/ husband and children Anticipated Discharge Location: TBD Barriers to Discharge: continued management Dispo: Anticipated discharge in approximately more than 2 day(s).   Starlyn Skeans, MD 12/04/2022, 6:14 AM Pager: [phone] After 5pm on weekdays and 1pm on weekends: On Call pager [phone]

## 2022-12-04 NOTE — Progress Notes (Signed)
Echocardiogram 2D Echocardiogram has been performed.  Claudia [name] 12/04/2022, 11:26 AM

## 2022-12-04 NOTE — Plan of Care (Signed)
  Problem: Health Behavior/Discharge Planning: Goal: Ability to manage health-related needs will improve Outcome: Progressing   Problem: Education: Goal: Knowledge of General Education information will improve Description: Including pain rating scale, medication(s)/side effects and non-pharmacologic comfort measures Outcome: Progressing   Problem: Health Behavior/Discharge Planning: Goal: Ability to manage health-related needs will improve Outcome: Progressing   Problem: Clinical Measurements: Goal: Ability to maintain clinical measurements within normal limits will improve Outcome: Progressing Goal: Will remain free from infection Outcome: Progressing Goal: Diagnostic test results will improve Outcome: Progressing Goal: Respiratory complications will improve Outcome: Progressing Goal: Cardiovascular complication will be avoided Outcome: Progressing   Problem: Activity: Goal: Risk for activity intolerance will decrease Outcome: Progressing   Problem: Nutrition: Goal: Adequate nutrition will be maintained Outcome: Progressing

## 2022-12-04 NOTE — Consult Note (Addendum)
Cardiology Consultation   Patient ID: ANE METHOD MRN: [id number]; DOB: 12/26/1965  Admit date: 12/02/2022 Date of Consult: 12/04/2022  PCP:  Fenton Foy, NP   [facility] Cardiologist:  Candee Furbish, MD   {  Patient Profile:   Claudia [name] is a 57 y.o. female with a hx of HFpEF, NSVT, DM2, IDA, HTN, chronically on 2L O2 who is being seen 12/04/2022 for the evaluation of heart failure at the request of Dr. Saverio Danker.  History of Present Illness:   Claudia [name] was admitted in April 2023 with CAP. CTA negative for PE treated with antibiotics.   Admitted with A/C HFpEF. Initially on oxygen. Diuresed with IV lasix and transitioned to lasix as needed. Placed on SGLT2i. D/C weight 278lbs.  She was seen HF TOA 07/2022 and set up for RHC. Started on [place]. Sleep study was set up. Sleep study showed severe OSA.  Had [place] 08/2022 which showed mild mixed pulmonary with mildly elevated left sided filling pressures.   Follows with advanced heart failure team. Last seen 08/2022 and was overall feeling fine.   The patient presented to the ER 12/02/22 for being off balance. The patient reports she has been dizzy and off balance for a while. She also reports headaches. She reports chronic DOE, but it's been worse in the last month. She takes lasix as needed for swelling. She denies any focal weakness. No chest pain reported.   In the ER BP 154/84, pulse 85bpm, afebrile, RR 13, 89% O2. Labs in the ER showed AST 48, ALT 47, Chloride 95. MRI brain showed no acute abnormality. BNP 386. Mag 2.3. UA showed glucose>500, protein 100. CXR showed cardiomegaly with vascular congestion and possible mild pulmonary interstitial edema. CT head showed no acute abnormality. Chest CTA showed no PE, new small pericardial effusion, pulmonary HTN. The patient was given tylenol, IV lasix, NS bolus.  Past Medical History:  Diagnosis Date   Diabetes mellitus    Fibroids    Headache(784.0)    History  of echocardiogram    Echo 7/18: Moderate concentric LVH, EF 55-60, normal wall motion, grade 1 diastolic dysfunction, calcified aortic leaflets   Hypercholesterolemia    Hyperlipidemia    Hypertension    Osteoarthritis    , with mild meniscus tear, followed by orthopedics--Dr Gladstone Lighter 04/29/10   Right knee pain    Seizures ([place])    as child   STEMI (ST elevation myocardial infarction) ([place])    Vitamin D deficiency     Past Surgical History:  Procedure Laterality Date   ANTERIOR CERVICAL DECOMP/DISCECTOMY FUSION N/A 09/07/2013   Procedure: ANTERIOR CERVICAL DECOMPRESSION/DISCECTOMY FUSION 1 LEVEL;  Surgeon: Sinclair Ship, MD;  Location: [facility];  Service: Orthopedics;  Laterality: N/A;  Anterior cervical decompression fusion, cervical 5-6 with instrumentation and allograft   LAPAROSCOPIC ASSISTED VAGINAL HYSTERECTOMY  1/14   LEFT HEART CATH AND CORONARY ANGIOGRAPHY N/A 04/25/2018   Procedure: LEFT HEART CATH AND CORONARY ANGIOGRAPHY;  Surgeon: Lorretta Harp, MD;  Location: [facility];  Service: Cardiovascular;  Laterality: N/A;   RIGHT HEART CATH N/A 09/16/2022   Procedure: RIGHT HEART CATH;  Surgeon: Jolaine Artist, MD;  Location: [facility];  Service: Cardiovascular;  Laterality: N/A;   SHOULDER SURGERY Right 11   rotator cuff   TUBAL LIGATION     UMBILICAL HERNIA REPAIR       Home Medications:  Prior to Admission medications   Medication Sig Start  Date End Date Taking? Authorizing Provider  acetaminophen (TYLENOL) 500 MG tablet Take 500 mg by mouth every 6 (six) hours as needed for mild pain or moderate pain.   Yes [provider]  amLODipine (NORVASC) 5 MG tablet Take 1 tablet (5 mg total) by mouth daily. 10/29/22  Yes Fenton Foy, NP  aspirin 81 MG EC tablet Take 1 tablet (81 mg total) by mouth daily. 10/04/19  Yes Azzie Glatter, FNP  Blood Glucose Monitoring Suppl (ACCU-CHEK GUIDE) w/Device KIT 1 each by Does not apply route in the  morning, at noon, in the evening, and at bedtime. 10/28/22   Fenton Foy, NP  dapagliflozin propanediol (FARXIGA) 10 MG TABS tablet Take 1 tablet (10 mg total) by mouth daily. 09/17/22  Yes Fenton Foy, NP  ferrous sulfate 325 (65 FE) MG tablet Take 1 tablet (325 mg total) by mouth daily with breakfast. 08/17/22  Yes Arrien, Jimmy Picket, MD  furosemide (LASIX) 40 MG tablet Take 1 tablet (40 mg total) by mouth daily as needed for edema or fluid (as needed for leg swelling, shortness of breath or weight gain 2 to 3 lbs in 24 hrs or 5 lbs in 7 days). 10/23/22  Yes Bensimhon, Shaune Pascal, MD  glipiZIDE (GLUCOTROL) 10 MG tablet Take 1 tablet (10 mg total) by mouth 2 (two) times daily before a meal. Patient taking differently: Take 5 mg by mouth daily as needed (Depends on blood sugar). 08/26/22 08/26/23 Yes Clegg, Amy D, NP  glucose blood (ACCU-CHEK GUIDE) test strip Use as instructed to check sugar 4 times daily. 10/28/22   Fenton Foy, NP  hydrALAZINE (APRESOLINE) 25 MG tablet Take 1 tablet (25 mg total) by mouth every 8 (eight) hours. 08/25/21  Yes King, Diona Foley, NP  Lancets (ACCU-CHEK MULTICLIX) lancets Use as instructed to check blood sugar 4 times daily. 10/28/22   Fenton Foy, NP  metFORMIN (GLUCOPHAGE) 1000 MG tablet TAKE 1 TABLET BY MOUTH TWICE DAILY WITH A MEAL 09/22/22  Yes Fenton Foy, NP  Omega-3 Fatty Acids (FISH OIL) 1000 MG CAPS Take 1,000 mg by mouth daily.   Yes [provider]  sacubitril-valsartan (ENTRESTO) 49-51 MG Take 1 tablet by mouth 2 (two) times daily. 08/26/22  Yes Clegg, Amy D, NP  Semaglutide,0.25 or 0.'5MG'$ /DOS, 2 MG/3ML SOPN Inject 0.5 mg into the skin once a week. 11/16/22  Yes Bensimhon, Shaune Pascal, MD  spironolactone (ALDACTONE) 25 MG tablet Take 1 tablet (25 mg total) by mouth daily. 08/26/22  Yes Clegg, Amy D, NP  blood glucose meter kit and supplies KIT Dispense based on patient and insurance preference. Use up to four times daily as  directed. (FOR ICD-9 250.00, 250.01). 10/03/19   Azzie Glatter, FNP  OVER THE COUNTER MEDICATION Take 1 capsule by mouth daily. Herbal Supplement  -- unknown name    [provider]    Inpatient Medications: Scheduled Meds:  amLODipine  5 mg Oral Daily   enoxaparin (LOVENOX) injection  0.5 mg/kg Subcutaneous Q24H   sacubitril-valsartan  1 tablet Oral BID   spironolactone  25 mg Oral Daily   Continuous Infusions:  PRN Meds: [DISCONTINUED] acetaminophen **OR** acetaminophen, acetaminophen  Allergies:    Allergies  Allergen Reactions   Beef-Derived Products     Patient does not eat beef   Crestor [Rosuvastatin]     MUSCLE ACHES HEADACHES    Diltiazem Hives   Hydrocodone Itching   Other Hives, Swelling and Other (See Comments)  Vision changes; ALLERGIC TO ALL NUTS EXCEPT PEANUTS   Oxycodone Itching   Pork-Derived Products     For dietary, patient does not eat pork   Pravachol [Pravastatin Sodium] Itching    Joint pain   Simvastatin Hives    Social History:   Social History   Socioeconomic History   Marital status: Married    Spouse name: Claudia [name]   Number of children: 6   Years of education: Not on file   Highest education level: High school graduate  Occupational History   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   Smoking status: Never   Smokeless tobacco: Never  Vaping Use   Vaping Use: Never used  Substance and Sexual Activity   Alcohol use: No   Drug use: No   Sexual activity: Yes  Other Topics Concern   Not on file  Social History Narrative   Not on file   Social Determinants of Health   Financial Resource Strain: Medium Risk (08/14/2022)   Overall Financial Resource Strain (CARDIA)    Difficulty of Paying Living Expenses: Somewhat hard  Food Insecurity: No Food Insecurity (12/02/2022)   Hunger Vital Sign    Worried About Running Out of Food in the Last Year: Never true    Ran Out of Food in the Last Year: Never true   Transportation Needs: No Transportation Needs (12/02/2022)   PRAPARE - Hydrologist (Medical): No    Lack of Transportation (Non-Medical): No  Physical Activity: Not on file  Stress: Not on file  Social Connections: Not on file  Intimate Partner Violence: Not At Risk (12/02/2022)   Humiliation, Afraid, Rape, and Kick questionnaire    Fear of Current or Ex-Partner: No    Emotionally Abused: No    Physically Abused: No    Sexually Abused: No    Family History:    Family History  Problem Relation Age of Onset   Hypertension Mother    Diabetes Mother    Hypercholesterolemia Mother    Hypertension Sister    CVA Maternal Grandmother    Hypertension Maternal Grandmother    Diabetes Maternal Grandmother    Heart attack Neg Hx      ROS:  Please see the history of present illness.   All other ROS reviewed and negative.     Physical Exam/Data:   Vitals:   12/04/22 0830 12/04/22 0835 12/04/22 0856 12/04/22 1139  BP:  136/78  112/72  Pulse: 83  87 77  Resp: '18  20 18  '$ Temp: 98.5 F (36.9 C)   98 F (36.7 C)  TempSrc: Oral   Oral  SpO2: 92%  97% 96%  Weight:      Height:        Intake/Output Summary (Last 24 hours) at 12/04/2022 1415 Last data filed at 12/04/2022 1314 Gross per 24 hour  Intake 240 ml  Output 1050 ml  Net -810 ml      12/04/2022    4:28 AM 12/03/2022    5:13 AM 12/02/2022    6:55 PM  Last 3 Weights  Weight (lbs) 251 lb 258 lb 9.6 oz 258 lb 13.1 oz  Weight (kg) 113.853 kg 117.3 kg 117.4 kg     Body mass index is 38.16 kg/m.  General:  Well nourished, well developed, in no acute distress HEENT: normal Neck: no JVD Vascular: No carotid bruits; Distal pulses 2+ bilaterally Cardiac:  normal S1, S2; RRR; no murmur  Lungs:  clear to auscultation bilaterally, no wheezing, rhonchi or rales  Abd: soft, nontender, no hepatomegaly  Ext: no edema Musculoskeletal:  No deformities, BUE and BLE strength normal and equal Skin: warm and dry   Neuro:  CNs 2-12 intact, no focal abnormalities noted Psych:  Normal affect   EKG:  The EKG was personally reviewed and demonstrates:  NSR 99bpm, Rad, nonspecific T wave changes Telemetry:  Telemetry was personally reviewed and demonstrates:  NSR HR 70-80, 7 beats NSVT  Relevant CV Studies:  Cardiac Testing  RHC 09/16/22  RA = 2 RV = 46/5 PA = 48/30 (37) PCW = 20 Fick cardiac output/index = 7.4/3.2 Thermo CO/CI = 8.7/3.8 PVR = 2.3 WU Ao sat = 93% PA sat = 70%, 71% 1. Mild mixed pulmonary with mildly elevated left-sided filling pressures Continue medical therapy and weight loss efforts. No role for selective pulmonary vasodilators.   Echo 2013: EF 40-45 (in setting of HTN urgency) Echo 7/18: EF 55-60 normal RV  Echo 7/19: EF 55-60 normal RV Echo 08/17/2022: EF 55-60% RV moderately reduced.    Cath 7/19: Normal coronary arteries    Laboratory Data:  High Sensitivity Troponin:  No results for input(s): "TROPONINIHS" in the last 720 hours.   Chemistry Recent Labs  Lab 12/02/22 0707 12/03/22 0055 12/03/22 1324 12/03/22 1642 12/04/22 0107  NA 138 138 137 139 140  K 4.7 3.8 4.2 4.1 3.9  CL 95* 95*  --   --  91*  CO2 33* 39*  --   --  43*  GLUCOSE 155* 151*  --   --  124*  BUN 13 8  --   --  8  CREATININE 0.68 0.71  --   --  0.72  CALCIUM 8.9 8.6*  --   --  8.6*  MG 2.3  --   --   --   --   GFRNONAA >60 >60  --   --  >60  ANIONGAP 10 4*  --   --  6    Recent Labs  Lab 12/02/22 0707 12/03/22 0055  PROT 7.3 7.2  ALBUMIN 3.8 3.6  AST 48* 34  ALT 47* 40  ALKPHOS 46 44  BILITOT 1.1 1.0   Lipids  Recent Labs  Lab 12/02/22 1924  CHOL 196  TRIG 75  HDL 41  LDLCALC 140*  CHOLHDL 4.8    Hematology Recent Labs  Lab 12/02/22 1924 12/03/22 0055 12/03/22 1324 12/03/22 1642 12/04/22 0107  WBC 6.0 6.2  --   --  4.5  RBC 6.61* 6.88*  6.78*  --   --  6.86*  HGB 13.9 14.2 17.0* 16.7* 14.6  HCT 49.5* 52.4* 50.0* 49.0* 51.7*  MCV 74.9* 76.2*  --   --  75.4*   MCH 21.0* 20.6*  --   --  21.3*  MCHC 28.1* 27.1*  --   --  28.2*  RDW 21.3* 21.7*  --   --  21.4*  PLT 359 329  --   --  356   Thyroid No results for input(s): "TSH", "FREET4" in the last 168 hours.  BNP Recent Labs  Lab 12/02/22 0707  BNP 386.6*    DDimer No results for input(s): "DDIMER" in the last 168 hours.   Radiology/Studies:  ECHOCARDIOGRAM COMPLETE  Result Date: 12/04/2022    ECHOCARDIOGRAM REPORT   Patient Name:   VALESKA COURTADE Date of Exam: 12/04/2022 Medical Rec #:  [id number]      Height:  68.0 in Accession #:    [id number]     Weight:       251.0 lb Date of Birth:  16-Jan-1966     BSA:          2.251 m Patient Age:    23 years       BP:           148/84 mmHg Patient Gender: F              HR:           81 bpm. Exam Location:  Inpatient Procedure: 2D Echo, Cardiac Doppler and Color Doppler Indications:    CHF-Acute Diastolic [id number]  History:        Patient has prior history of Echocardiogram examinations, most                 recent 08/13/2022. CHF, Previous Myocardial Infarction,                 Arrythmias:Tachycardia, Signs/Symptoms:Dyspnea; Risk                 Factors:Hypertension, Diabetes and Dyslipidemia.  Sonographer:    Ronny Flurry Referring Phys: [id number] GRACE LAU  Sonographer Comments: Patient is obese. Image acquisition challenging due to respiratory motion. IMPRESSIONS  1. Left ventricular ejection fraction, by estimation, is 45%. The left ventricle has mildly decreased function. The left ventricle demonstrates regional wall motion abnormalities with septal hypokinesis. There is mild concentric left ventricular hypertrophy. Left ventricular diastolic parameters are consistent with Grade I diastolic dysfunction (impaired relaxation).  2. Mildly D-shaped interventricular septum suggestive of RV pressure/volume overload. Right ventricular systolic function is moderately reduced. The right ventricular size is mildly enlarged. Tricuspid regurgitation signal is  inadequate for assessing PA  pressure.  3. The mitral valve is normal in structure. Trivial mitral valve regurgitation. No evidence of mitral stenosis.  4. The aortic valve is tricuspid. Aortic valve regurgitation is not visualized. No aortic stenosis is present.  5. The inferior vena cava is normal in size with greater than 50% respiratory variability, suggesting right atrial pressure of 3 mmHg. FINDINGS  Left Ventricle: Left ventricular ejection fraction, by estimation, is 45%. The left ventricle has mildly decreased function. The left ventricle demonstrates regional wall motion abnormalities. The left ventricular internal cavity size was normal in size. There is mild concentric left ventricular hypertrophy. Left ventricular diastolic parameters are consistent with Grade I diastolic dysfunction (impaired relaxation). Right Ventricle: Mildly D-shaped interventricular septum suggestive of RV pressure/volume overload. The right ventricular size is mildly enlarged. No increase in right ventricular wall thickness. Right ventricular systolic function is moderately reduced.  Tricuspid regurgitation signal is inadequate for assessing PA pressure. Left Atrium: Left atrial size was normal in size. Right Atrium: Right atrial size was normal in size. Pericardium: There is no evidence of pericardial effusion. The pericardial effusion is circumferential. Mitral Valve: The mitral valve is normal in structure. Trivial mitral valve regurgitation. No evidence of mitral valve stenosis. Tricuspid Valve: The tricuspid valve is normal in structure. Tricuspid valve regurgitation is not demonstrated. Aortic Valve: The aortic valve is tricuspid. Aortic valve regurgitation is not visualized. No aortic stenosis is present. Aortic valve mean gradient measures 5.0 mmHg. Aortic valve peak gradient measures 8.4 mmHg. Aortic valve area, by VTI measures 3.21 cm. Pulmonic Valve: The pulmonic valve was normal in structure. Pulmonic valve  regurgitation is trivial. Aorta: The aortic root is normal in size and structure. Venous: The inferior vena cava is normal in  size with greater than 50% respiratory variability, suggesting right atrial pressure of 3 mmHg. IAS/Shunts: No atrial level shunt detected by color flow Doppler.  LEFT VENTRICLE PLAX 2D LVIDd:         4.90 cm   Diastology LVIDs:         3.70 cm   LV e' medial:    4.68 cm/s LV PW:         1.30 cm   LV E/e' medial:  11.4 LV IVS:        1.10 cm   LV e' lateral:   4.73 cm/s LVOT diam:     2.40 cm   LV E/e' lateral: 11.3 LV SV:         84 LV SV Index:   37 LVOT Area:     4.52 cm  RIGHT VENTRICLE             IVC RV S prime:     11.50 cm/s  IVC diam: 2.00 cm TAPSE (M-mode): 2.4 cm LEFT ATRIUM             Index        RIGHT ATRIUM           Index LA diam:        4.00 cm 1.78 cm/m   RA Area:     16.60 cm LA Vol (A2C):   57.0 ml 25.32 ml/m  RA Volume:   41.90 ml  18.62 ml/m LA Vol (A4C):   78.8 ml 35.01 ml/m LA Biplane Vol: 59.9 ml 26.61 ml/m  AORTIC VALVE AV Area (Vmax):    3.20 cm AV Area (Vmean):   3.12 cm AV Area (VTI):     3.21 cm AV Vmax:           145.00 cm/s AV Vmean:          98.400 cm/s AV VTI:            0.262 m AV Peak Grad:      8.4 mmHg AV Mean Grad:      5.0 mmHg LVOT Vmax:         102.63 cm/s LVOT Vmean:        67.767 cm/s LVOT VTI:          0.186 m LVOT/AV VTI ratio: 0.71  AORTA Ao Root diam: 3.40 cm Ao Asc diam:  3.30 cm MITRAL VALVE MV Area (PHT): 3.77 cm    SHUNTS MV Decel Time: 201 msec    Systemic VTI:  0.19 m MV E velocity: 53.40 cm/s  Systemic Diam: 2.40 cm MV A velocity: 76.20 cm/s MV E/A ratio:  0.70 Dalton McleanMD Electronically signed by Franki Monte Signature Date/Time: 12/04/2022/1:25:56 PM    Final    CT Angio Chest Pulmonary Embolism (PE) W or WO Contrast  Result Date: 12/03/2022 CLINICAL DATA:  Shortness of breath; dizziness and headache EXAM: CT ANGIOGRAPHY CHEST WITH CONTRAST TECHNIQUE: Multidetector CT imaging of the chest was performed using the  standard protocol during bolus administration of intravenous contrast. Multiplanar CT image reconstructions and MIPs were obtained to evaluate the vascular anatomy. RADIATION DOSE REDUCTION: This exam was performed according to the departmental dose-optimization program which includes automated exposure control, adjustment of the mA and/or kV according to patient size and/or use of iterative reconstruction technique. CONTRAST:  73m OMNIPAQUE IOHEXOL 350 MG/ML SOLN COMPARISON:  Chest CT May 22, 2022 FINDINGS: Cardiovascular: No evidence of pulmonary embolus. Evaluation of the subsegmental pulmonary arteries is somewhat limited  due to streak artifact. Cardiomegaly. New small pericardial effusion. Normal caliber thoracic aorta with mild atherosclerotic disease. Dilated main pulmonary artery, measuring up to 3.6 mm. Mediastinum/Nodes: Esophagus and thyroid are unremarkable. Mildly enlarged right hilar lymph nodes. Reference node measuring 1.4 cm on series 7, image 162, unchanged when compared with the prior exam and likely reactive. Lungs/Pleura: Central airways are patent. Mild mosaic attenuation. Interval resolution of right upper lobe consolidation. Mild bibasilar opacities, likely due to atelectasis. No pleural effusion or pneumothorax. Upper Abdomen: No acute abnormality. Musculoskeletal: No chest wall abnormality. No acute or significant osseous findings. Review of the MIP images confirms the above findings. IMPRESSION: 1. No evidence of pulmonary embolus. 2. Cardiomegaly with new small pericardial effusion. 3. Mild bibasilar opacities which are likely due to atelectasis. 4. Mild mosaic attenuation, findings can be seen in the setting of small airways disease or mosaic perfusion secondary to pulmonary hypertension. 5. Dilated main pulmonary artery, findings can be seen in the setting of pulmonary hypertension. 6. Aortic Atherosclerosis (ICD10-I70.0). Electronically Signed   By: Yetta Glassman M.D.   On:  12/03/2022 11:52   CT Head Wo Contrast  Result Date: 12/02/2022 CLINICAL DATA:  Headache and dizziness EXAM: CT HEAD WITHOUT CONTRAST TECHNIQUE: Contiguous axial images were obtained from the base of the skull through the vertex without intravenous contrast. RADIATION DOSE REDUCTION: This exam was performed according to the departmental dose-optimization program which includes automated exposure control, adjustment of the mA and/or kV according to patient size and/or use of iterative reconstruction technique. COMPARISON:  Same day brain MRI FINDINGS: Brain: No evidence of acute infarction, hemorrhage, hydrocephalus, extra-axial collection or mass lesion/mass effect. Enlarged and partially empty sella. Vascular: No hyperdense vessel or unexpected calcification. Skull: Normal. Negative for fracture or focal lesion. Sinuses/Orbits: No middle ear or mastoid effusion. Paranasal sinuses are notable for polypoid mucosal thickening of the floor of the left maxillary sinus. Orbits are unremarkable. Other: None IMPRESSION: No acute intracranial abnormality. Electronically Signed   By: Marin Roberts M.D.   On: 12/02/2022 13:15   DG Chest 2 View  Result Date: 12/02/2022 CLINICAL DATA:  Shortness of breath EXAM: CHEST - 2 VIEW COMPARISON:  Chest radiograph 08/15/2022. FINDINGS: The heart is significantly enlarged, unchanged. The upper mediastinal contours are normal. There is vascular congestion and possible mild pulmonary interstitial edema. There is no focal consolidation. There is no pleural effusion or pneumothorax There is no acute osseous abnormality. IMPRESSION: Cardiomegaly with vascular congestion and possible mild pulmonary interstitial edema. Electronically Signed   By: Valetta Mole M.D.   On: 12/02/2022 12:34   MR BRAIN WO CONTRAST  Result Date: 12/02/2022 CLINICAL DATA:  Headache, neuro deficit. Headache and dizziness since November. EXAM: MRI HEAD WITHOUT CONTRAST TECHNIQUE: Multiplanar, multiecho pulse  sequences of the brain and surrounding structures were obtained without intravenous contrast. COMPARISON:  Head CT 05/01/2013. FINDINGS: Brain: No acute infarct or hemorrhage. No mass or midline shift. No hydrocephalus or extra-axial collection. Questionable diffuse superficial signal abnormality on susceptibility weighted imaging is favored to represent artifact given uniformity and lack of correlation on other sequences. Vascular: Normal flow voids. Skull and upper cervical spine: Normal marrow signal. Sinuses/Orbits: Mild mucosal disease in the left sphenoid sinus. Orbits are unremarkable. Other: None. IMPRESSION: 1. No acute intracranial abnormality or mass. 2. Diffuse superficial signal abnormality on susceptibility weighted imaging is favored to represent artifact given uniformity and lack of correlation on other sequences. Iron infusions may also contribute to this appearance. If real, superficial siderosis  can be seen in the setting of chronic spinal CSF leak. Correlate for history of orthostatic headaches. Consider repeat MRI of the brain without and with contrast on a different scanner. Electronically Signed   By: Emmit Alexanders M.D.   On: 12/02/2022 09:30     Assessment and Plan:   HFmrEF Chronic O2 use on 2L O2 - presented with worsening DOE and dizziness with O2 dropping into the 80s - BNP 386 - CXR with vascular congestion - IV lasix in the ER - IV lasix '60mg'$  BID - continue amlodipine '5mg'$  daily, Entresto 49-'51mg'$ BID, spironolactone '25mg'$  daily - I/Os not completed - Echo showed LVEF 45%, G1DD, septal HK - Prior echo 07/2022 showed LVEF 55-60% - H/o normal coronaries on cath 2019 - kidney function is stable - appears relatively euvolemic on exam, can likely switch to oral lasix tomorrow. PTA lasix '40mg'$  PRN. - she is requiring 6 LO2. if sob continue, may need RHC. - Given low EF, unsure repeat LHC would be beneficial. Can add GDTM as tolerated and re-check an echo in 2-3  months  Dizziness/vision changes - intermittent dizziness - CT head negative for acute abnormalities - MRI brain showed no acute abnormality - check orthostatics  DM2 - per IM  CAD - continue ASA - allergy to statins  OSA - severe sleep apnea, uses O2 at home - reports she has not seen pulmonology in the past  Pericardial effusion - new small pericardial effusion on chest CT - echo showed no effusion  For questions or updates, please contact [facility] Please consult [email or web address] for contact info under    Signed, Cadence Ninfa Meeker, PA-C  12/04/2022 2:15 PM     Patient seen and examined. Agree with assessment and plan.  Ms. Tyja Montas is a 57 year old African-American female who is followed by Dr. Marlou Porch.  She has a history of heart failure preserved EF, nonsustained VT, type 2 diabetes mellitus, hypertension, on supplemental oxygen at home.  Cardiac catheterization in 2019 showed normal coronary arteries.  An echo Doppler study November 2023 showed an EF of 55 to 123456 with diastolic dysfunction.  She has recently been diagnosed with severe sleep apnea on NMR home sleep testing and has not yet initiated CPAP therapy waiting for her device.  Recently, she has experienced some episodes of dizziness.  She has shortness of breath which may have worsened over the past 9 months and takes Lasix on an as-needed basis.  She presented to the emergency room and on presentation BP was 154/84, rhythm sinus at rate 85 bpm.  O2 saturation was 89%.  BNP was mildly elevated at 386.  Urinalysis showed glucose greater than 500 protein 100.  Chest x-ray reveals cardiomegaly with vascular congestion with possible mild interstitial edema.  Chest CTA showed no PE and small pericardial effusion and suggestion of pulmonary hypertension.  As only, she is in no acute distress.  Rhythm is regular with heart rate in the low 80s.  HEENT is unremarkable.  Sclera is anicteric.  JVD approximate 7 to 8 cm.   There are no carotid bruits.  Lungs were relatively clear.  Rhythm was regular with trivial systolic murmur, no S3 gallop.  Abdomen is protuberant nontender with positive bowel sounds.  There is no lower extremity edema.  Neurologic exam was grossly nonfocal.  Echo done today shows EF now at 45% with interventricular septal motion suggestive of RV pressure/volume overload.  RV is mildly enlarged.  Laboratory is notable for BUN 8, creatinine  0.72, CO2 is increased at 43 suggestive of possible contraction alkalosis.  Hematocrit 51.7.  She has microcytic indices with MCV 75.  She received Lasix 60 IV x1, with probable contraction alkalosis consider short-term acetazolamine (DIamox) 250 mg for 2 - 3 doses. She has been been started on Farxiga yesterday.  She is awaiting her CPAP to initiate at home and has not yet received a device.  With microcytic indices consider checking iron level.  Will follow.  Troy Sine, MD, [facility] 12/04/2022 4:15 PM

## 2022-12-04 NOTE — Progress Notes (Signed)
Approximately 1000-- Received critical lab result. Pt pCO2 84. MD Charise Killian notified. Pt currently on 6L [place], O2 94%. A&O x 4, mentating well. Hildred Alamin, RRT notified.  Approximately 12:45--Directed to leave pt on [facility] at this time per Charise Killian, MD. Holding off on switching pt to BiPAP. RN to monitor pt for any drowsiness or confusion. RN to titrate pt supplemental O2 down as tolerated.

## 2022-12-04 NOTE — Progress Notes (Signed)
RT informed RN of pt's 2042 ABG results. Pt will be placed on Bipap QHS per MD order.

## 2022-12-04 NOTE — Evaluation (Signed)
Physical Therapy Evaluation Patient Details Name: Claudia [name] MRN: [id number] DOB: [birth year] Today's Date: 12/04/2022  History of Present Illness  57 year old female adm 3/6 with a past medical history significant for HTN, HLD, CAD, NSTEMI, pSVT, HFpEF 50-55%, obesity, poorly controlled T2DM, ACS, IDA, C5-C6 ACDF, OA, chronic oxygen use (2L) who presents to the ED for shortness of breath.  Patient also experiencing dizziness and headache.  Clinical Impression  Pt presents with admitting diagnosis above. Pt was able to ambulate in hallway and navigate stairs today. Pt did report dizziness during mobility today however VSS and no LOB or nystagmus noted. Pt presents at or near baseline mobility and has no further acute PT needs. Pt was educated on using RW for mobility at home instead of furniture and pt would benefit from OPPT for higher level balance training. Pt would also benefit from mobility specialist referral during acute stay.     Recommendations for follow up therapy are one component of a multi-disciplinary discharge planning process, led by the attending physician.  Recommendations may be updated based on patient status, additional functional criteria and insurance authorization.  Follow Up Recommendations Outpatient PT (For higher level balance training)      Assistance Recommended at Discharge PRN  Patient can return home with the following  A little help with walking and/or transfers;A little help with bathing/dressing/bathroom;Assistance with cooking/housework;Assist for transportation;Help with stairs or ramp for entrance    Equipment Recommendations None recommended by PT (Pt has needed DME)  Recommendations for Other Services       Functional Status Assessment Patient has had a recent decline in their functional status and demonstrates the ability to make significant improvements in function in a reasonable and predictable amount of time.     Precautions / Restrictions  Precautions Precautions: Fall Precaution Comments: O2 sats and HR Restrictions Weight Bearing Restrictions: No      Mobility  Bed Mobility               General bed mobility comments: Pt received in chair    Transfers Overall transfer level: Needs assistance Equipment used: None Transfers: Sit to/from Stand Sit to Stand: Supervision                Ambulation/Gait Ambulation/Gait assistance: Supervision Gait Distance (Feet): 300 Feet (2 standing rest breaks) Assistive device: Rolling walker (2 wheels) Gait Pattern/deviations: Step-through pattern, Narrow base of support Gait velocity: decreased     General Gait Details: No LOB noted.  Stairs Stairs: Yes Stairs assistance: Min guard Stair Management: Two rails, Forwards, Step to pattern Number of Stairs: 2 General stair comments: No LOB noted  Wheelchair Mobility    Modified Rankin (Stroke Patients Only)       Balance Overall balance assessment: Mild deficits observed, not formally tested                                           Pertinent Vitals/Pain Pain Assessment Pain Assessment: No/denies pain    Home Living Family/patient expects to be discharged to:: Private residence Living Arrangements: Spouse/significant other Available Help at Discharge: Family;Available 24 hours/day Type of Home: House Home Access: Stairs to enter Entrance Stairs-Rails: Can reach both (On the front) Entrance Stairs-Number of Steps: 4 to the front and 2 in the back.  Enters the home from the back stairs.  No rail.   Home Layout: One level  Home Equipment: Grab bars - tub/shower;Grab bars - toilet;Rolling Walker (2 wheels)      Prior Function Prior Level of Function : Independent/Modified Independent;Working/employed;History of Falls (last six months);Driving             Mobility Comments: Pt reports being a furniture walker or using her husband depending on dizziness. ADLs Comments:  Spouse assists with iADL, community mobility, and patient cares for her own ADL.     Hand Dominance   Dominant Hand: Right    Extremity/Trunk Assessment   Upper Extremity Assessment Upper Extremity Assessment: Overall WFL for tasks assessed    Lower Extremity Assessment Lower Extremity Assessment: Overall WFL for tasks assessed    Cervical / Trunk Assessment Cervical / Trunk Assessment: Normal  Communication   Communication: No difficulties  Cognition Arousal/Alertness: Awake/alert Behavior During Therapy: WFL for tasks assessed/performed Overall Cognitive Status: Within Functional Limits for tasks assessed                                          General Comments General comments (skin integrity, edema, etc.): Pt remained above 96% on 6L.    Exercises     Assessment/Plan    PT Assessment Patient does not need any further PT services  PT Problem List         PT Treatment Interventions      PT Goals (Current goals can be found in the Care Plan section)       Frequency       Co-evaluation               AM-PAC PT "6 Clicks" Mobility  Outcome Measure Help needed turning from your back to your side while in a flat bed without using bedrails?: None Help needed moving from lying on your back to sitting on the side of a flat bed without using bedrails?: None Help needed moving to and from a bed to a chair (including a wheelchair)?: None Help needed standing up from a chair using your arms (e.g., wheelchair or bedside chair)?: None Help needed to walk in hospital room?: None Help needed climbing 3-5 steps with a railing? : A Little 6 Click Score: 23    End of Session Equipment Utilized During Treatment: Gait belt;Oxygen Activity Tolerance: Patient tolerated treatment well Patient left: in chair;with call bell/phone within reach Nurse Communication: Mobility status PT Visit Diagnosis: Other abnormalities of gait and mobility (R26.89)     Time: 1429-1500 PT Time Calculation (min) (ACUTE ONLY): 31 min   Charges:   PT Evaluation $PT Eval Low Complexity: 1 Low PT Treatments $Gait Training: 23-37 mins        Shelby Mattocks, PT, DPT Acute Rehab Services [id number]   Viann Shove 12/04/2022, 4:17 PM

## 2022-12-05 DIAGNOSIS — R519 Headache, unspecified: Secondary | ICD-10-CM

## 2022-12-05 DIAGNOSIS — I110 Hypertensive heart disease with heart failure: Secondary | ICD-10-CM | POA: Diagnosis not present

## 2022-12-05 DIAGNOSIS — I5021 Acute systolic (congestive) heart failure: Secondary | ICD-10-CM

## 2022-12-05 LAB — CBC
HCT: 51.3 % — ABNORMAL HIGH (ref 36.0–46.0)
Hemoglobin: 14.7 g/dL (ref 12.0–15.0)
MCH: 21.1 pg — ABNORMAL LOW (ref 26.0–34.0)
MCHC: 28.7 g/dL — ABNORMAL LOW (ref 30.0–36.0)
MCV: 73.7 fL — ABNORMAL LOW (ref 80.0–100.0)
Platelets: 337 10*3/uL (ref 150–400)
RBC: 6.96 MIL/uL — ABNORMAL HIGH (ref 3.87–5.11)
RDW: 21.1 % — ABNORMAL HIGH (ref 11.5–15.5)
WBC: 3.8 10*3/uL — ABNORMAL LOW (ref 4.0–10.5)
nRBC: 0.5 % — ABNORMAL HIGH (ref 0.0–0.2)

## 2022-12-05 LAB — BASIC METABOLIC PANEL
Anion gap: 15 (ref 5–15)
BUN: 9 mg/dL (ref 6–20)
CO2: 35 mmol/L — ABNORMAL HIGH (ref 22–32)
Calcium: 8.8 mg/dL — ABNORMAL LOW (ref 8.9–10.3)
Chloride: 87 mmol/L — ABNORMAL LOW (ref 98–111)
Creatinine, Ser: 0.6 mg/dL (ref 0.44–1.00)
GFR, Estimated: >60 mL/min (ref >60)
Glucose, Bld: 90 mg/dL (ref 70–99)
Potassium: 3.5 mmol/L (ref 3.5–5.1)
Sodium: 137 mmol/L (ref 135–145)

## 2022-12-05 LAB — GLUCOSE, CAPILLARY
Glucose-Capillary: 162 mg/dL — ABNORMAL HIGH (ref 70–99)
Glucose-Capillary: 120 mg/dL — ABNORMAL HIGH (ref 70–99)
Glucose-Capillary: 129 mg/dL — ABNORMAL HIGH (ref 70–99)
Glucose-Capillary: 141 mg/dL — ABNORMAL HIGH (ref 70–99)

## 2022-12-05 MED ORDER — FUROSEMIDE 40 MG PO TABS
80.0000 mg | ORAL_TABLET | Freq: Two times a day (BID) | ORAL | Status: DC
  Administered 2022-12-05 – 2022-12-07 (×6): 80 mg via ORAL
  Filled 2022-12-05 (×6): qty 2

## 2022-12-05 MED ORDER — ACETAZOLAMIDE ER 500 MG PO CP12
500.0000 mg | ORAL_CAPSULE | Freq: Once | ORAL | Status: AC
  Administered 2022-12-05: 500 mg via ORAL
  Filled 2022-12-05: qty 1

## 2022-12-05 MED ORDER — FUROSEMIDE 40 MG PO TABS: 80.0000 mg | ORAL_TABLET | Freq: Once | ORAL | Status: DC

## 2022-12-05 MED ORDER — POLYETHYLENE GLYCOL 3350 17 G PO PACK
17.0000 g | PACK | Freq: Every day | ORAL | Status: DC
  Administered 2022-12-05 – 2022-12-07 (×3): 17 g via ORAL
  Filled 2022-12-05 (×3): qty 1

## 2022-12-05 MED ORDER — FUROSEMIDE 40 MG PO TABS: 80.0000 mg | ORAL_TABLET | Freq: Two times a day (BID) | ORAL | Status: DC

## 2022-12-05 NOTE — Progress Notes (Signed)
Subjective:   No new complaints this morning.  Breathing all right on 5 L.  She did urinate quite a bit yesterday.  Objective:  Vital signs in last 24 hours: Vitals:   12/05/22 0535 12/05/22 0821 12/05/22 0914 12/05/22 1009  BP: 126/70 124/79  116/79  Pulse: 78 79 76   Resp: '18 18 18   '$ Temp: 98.4 F (36.9 C) 98.3 F (36.8 C)    TempSrc: Oral Oral    SpO2: 100% 98% 97%   Weight: 113.2 kg     Height:       Physical Exam: Constitutional: Well-developed, well-nourished, appears uncomfortable  Cardiovascular: Regular rate, regular rhythm. No murmurs, rubs, or gallops. Normal radial and PT pulses bilaterally. No LE edema. JVD 1-2 cm above the sternal angle.  Pulmonary: Normal work of breathing on 5L [place]. No wheezes, rales, or rhonchi. Minimal crackles at the lung bases bilaterally.  Abdominal: Soft. Non-distended. No tenderness. Normal bowel sounds.  Musculoskeletal: Normal range of motion.     Neurological: Alert and oriented to person, place, and time. Non-focal.   Skin: warm and dry.  Assessment/Plan:  Principal Problem:   Acute systolic (congestive) heart failure ([place])  Claudia [name] is a 57 year old female with a past medical history significant for HTN, HLD, CAD, NSTEMI, pSVT, HFpEF 50-55%, obesity, poorly controlled T2DM, ACS, IDA, C5-C6 ACDF, OA, chronic oxygen use (2L) who presents to the ED for shortness of breath and was admitted for CHF exacerbation.    #HFpEF (EF 55-60%) #Chronic oxygen use (2L) #Hx of OSA #HTN Presented w/ worsening exertional dyspnea. Suspect symptoms 2/2 combination of HFpEF and untreated OSA.    Echocardiogram findings with regional wall motion abnormalities/septal hypokinesis was thought secondary to volume overload.. She has been diuresing with IV Lasix, showing improvement.  Diamox has been added today to further assist with oral Lasix and her CO2 retention.  Slowly weaning down back to baseline 2 L nasal cannula. - P.o. Lasix 80 mg twice  daily - One-time dose Diamox - Strict I's and O's - Daily weights   #Dizziness #Vision Changes #Hearing Difficulty Presented w/ several months of intermittent dizziness, floaters in her vision w/ flashes of color, uncontrollable twitching of UE/LE, more recently decreased hearing from both the ears.  Patient symptoms are suspicious for possible CSF leak.  Discussed with on-call radiologist about best to further evaluate this.  He described the superficial siderosis as most commonly seen with IV iron infusions and recurrent clinical subarachnoid hemorrhages.  Not commonly seen with iron tablets.  If she is having recurrent subarachnoid hemorrhages this could explain some of these neurofindings and is best evaluated with a diagnostic cerebral angiogram likely as an outpatient. -Possible vestibular rehab as outpatient  - possible outpatient dx cerebral angiogram   #T2DM #Obesity A1c 6.9% here. On farxiga 10 mg daily, glipizide 10 mg BID, metformin 1000 mg BID, and semaglutide 0.5 mg weekly at home. CBGs remain WNL. -Continue farxiga 10 mg daily  -Continue metformin 1000 mg BID  -Consider starting SSI if CBGs worsen -Trend CBGs    #Hx of ACS #Hx of NSTEMI #CAD #HLD Taking aspirin 81 mg daily. Has prior allergies to pravastatin, rosuvastatin, and atorvastatin. Lipid panel w/ exception of LDL 140. Could consider starting zetia at discharge.  -Aspirin 81 mg daily    #IDA Iron panel from 3 months ago w/ normal iron, elevated TIBC at 557, and decreased saturation ratios. Ferritin was decreased at 9. On Ferrous sulfate 325 mg daily at  home.  -Trend CBC   #OA #Hx of C5-C6 ACDF No pain at this time.  -Tylenol PRN     Diet: NPO while on Bipap Bowel: miralax VTE: lovenox IVF: none Code: Full PT/OT recs: No OT f/u, pending PT     Prior to Admission Living Arrangement: home w/ husband and children Anticipated Discharge Location: TBD Barriers to Discharge: continued management Dispo:  Anticipated discharge in approximately more than 2 day(s).   Delene Ruffini, MD

## 2022-12-05 NOTE — Progress Notes (Signed)
Mobility Specialist - Progress Note   12/05/22 1053  Mobility  Activity Ambulated with assistance in hallway  Level of Assistance Contact guard assist, steadying assist  Assistive Device None  Distance Ambulated (ft) 100 ft  Activity Response Tolerated well  Mobility Referral Yes  $Mobility charge 1 Mobility    Pt received sitting EOB and agreeable. Took standing break x2 in hallway, no complaints on walk. Left sitting EOB w/ call bell in reach and all needs met.   [place] Specialist Please contact via SecureChat or Rehab office at [phone]

## 2022-12-05 NOTE — Progress Notes (Signed)
DAILY PROGRESS NOTE   Patient Name: Claudia [name] Date of Encounter: 12/05/2022 Cardiologist: Candee Furbish, MD  Chief Complaint   No complaints  Patient Profile   Claudia [name] is a 57 y.o. female with a hx of HFpEF, NSVT, DM2, IDA, HTN, chronically on 2L O2 who is being seen 12/04/2022 for the evaluation of heart failure at the request of Dr. Saverio Danker   Subjective   Net negative 1.4L overnight- now 2L negative overall. Labs appear stable.  On GDMT with farxiga, entresto, lasix, aldactone. Switch to oral lasix 80 mg BID today. She reports breathing is better, but she still feels shaky. CO2 was elevated at 43, now 35 today - ordered for diamox.  Objective   Vitals:   12/05/22 0535 12/05/22 0821 12/05/22 0914 12/05/22 1009  BP: 126/70 124/79  116/79  Pulse: 78 79 76   Resp: '18 18 18   '$ Temp: 98.4 F (36.9 C) 98.3 F (36.8 C)    TempSrc: Oral Oral    SpO2: 100% 98% 97%   Weight: 113.2 kg     Height:        Intake/Output Summary (Last 24 hours) at 12/05/2022 1023 Last data filed at 12/05/2022 [id number] Gross per 24 hour  Intake 480 ml  Output 1800 ml  Net -1320 ml   Filed Weights   12/03/22 0513 12/04/22 0428 12/05/22 0535  Weight: 117.3 kg 113.9 kg 113.2 kg    Physical Exam   General appearance: alert and no distress Lungs: clear to auscultation bilaterally Heart: regular rate and rhythm Extremities: extremities normal, atraumatic, no cyanosis or edema Neurologic: Grossly normal  Inpatient Medications    Scheduled Meds:  amLODipine  5 mg Oral Daily   aspirin EC  81 mg Oral Daily   dapagliflozin propanediol  10 mg Oral Daily   enoxaparin (LOVENOX) injection  0.5 mg/kg Subcutaneous Q24H   furosemide  80 mg Oral BID   metFORMIN  1,000 mg Oral BID WC   sacubitril-valsartan  1 tablet Oral BID   spironolactone  25 mg Oral Daily    Continuous Infusions:   PRN Meds: [DISCONTINUED] acetaminophen **OR** acetaminophen, acetaminophen, mouth rinse   Labs   Results for  orders placed or performed during the hospital encounter of 12/02/22 (from the past 48 hour(s))  I-STAT 7, (LYTES, BLD GAS, ICA, H+H)     Status: Abnormal   Collection Time: 12/03/22  1:24 PM  Result Value Ref Range   pH, Arterial 7.393 7.35 - 7.45   pCO2 arterial 67.9 (HH) 32 - 48 mmHg   pO2, Arterial 110 (H) 83 - 108 mmHg   Bicarbonate 41.4 (H) 20.0 - 28.0 mmol/L   TCO2 43 (H) 22 - 32 mmol/L   O2 Saturation 98 %   Acid-Base Excess 12.0 (H) 0.0 - 2.0 mmol/L   Sodium 137 135 - 145 mmol/L   Potassium 4.2 3.5 - 5.1 mmol/L   Calcium, Ion 1.15 1.15 - 1.40 mmol/L   HCT 50.0 (H) 36.0 - 46.0 %   Hemoglobin 17.0 (H) 12.0 - 15.0 g/dL   Sample type ARTERIAL    Comment NOTIFIED PHYSICIAN   I-STAT 7, (LYTES, BLD GAS, ICA, H+H)     Status: Abnormal   Collection Time: 12/03/22  4:42 PM  Result Value Ref Range   pH, Arterial 7.326 (L) 7.35 - 7.45   pCO2 arterial 84.3 (HH) 32 - 48 mmHg   pO2, Arterial 91 83 - 108 mmHg   Bicarbonate 44.2 (H)  20.0 - 28.0 mmol/L   TCO2 47 (H) 22 - 32 mmol/L   O2 Saturation 96 %   Acid-Base Excess 13.0 (H) 0.0 - 2.0 mmol/L   Sodium 139 135 - 145 mmol/L   Potassium 4.1 3.5 - 5.1 mmol/L   Calcium, Ion 1.19 1.15 - 1.40 mmol/L   HCT 49.0 (H) 36.0 - 46.0 %   Hemoglobin 16.7 (H) 12.0 - 15.0 g/dL   Patient temperature 98.0 F    Collection site RADIAL, ALLEN'S TEST ACCEPTABLE    Drawn by RT    Sample type ARTERIAL    Comment NOTIFIED PHYSICIAN   Blood gas, arterial     Status: Abnormal   Collection Time: 12/03/22  8:42 PM  Result Value Ref Range   pH, Arterial 7.45 7.35 - 7.45   pCO2 arterial 75 (HH) 32 - 48 mmHg    Comment: CRITICAL RESULT CALLED TO, READ BACK BY AND VERIFIED WITH: [email or web address].[email or web address],RT '@2103'$  12/03/2022 VANG.J    pO2, Arterial 57 (L) 83 - 108 mmHg   Bicarbonate 52.8 (H) 20.0 - 28.0 mmol/L   Acid-Base Excess 23.8 (H) 0.0 - 2.0 mmol/L   O2 Saturation 90 %   Patient temperature 36.7    Collection site RIGHT BRACHIAL    Drawn by Elicia Lamp.ADAMS     Allens test (pass/fail) PASS PASS    Comment: Performed at [facility], [place] [address], [place], [place] [address]  Glucose, capillary     Status: Abnormal   Collection Time: 12/03/22  9:26 PM  Result Value Ref Range   Glucose-Capillary 165 (H) 70 - 99 mg/dL    Comment: Glucose reference range applies only to samples taken after fasting for at least 8 hours.  Basic metabolic panel     Status: Abnormal   Collection Time: 12/04/22  1:07 AM  Result Value Ref Range   Sodium 140 135 - 145 mmol/L   Potassium 3.9 3.5 - 5.1 mmol/L   Chloride 91 (L) 98 - 111 mmol/L   CO2 43 (H) 22 - 32 mmol/L   Glucose, Bld 124 (H) 70 - 99 mg/dL    Comment: Glucose reference range applies only to samples taken after fasting for at least 8 hours.   BUN 8 6 - 20 mg/dL   Creatinine, Ser 0.72 0.44 - 1.00 mg/dL   Calcium 8.6 (L) 8.9 - 10.3 mg/dL   GFR, Estimated >60 >60 mL/min    Comment: (NOTE) Calculated using the CKD-EPI Creatinine Equation (2021)    Anion gap 6 5 - 15    Comment: Performed at [facility] [address], [place], [place] [address]  CBC     Status: Abnormal   Collection Time: 12/04/22  1:07 AM  Result Value Ref Range   WBC 4.5 4.0 - 10.5 K/uL   RBC 6.86 (H) 3.87 - 5.11 MIL/uL   Hemoglobin 14.6 12.0 - 15.0 g/dL   HCT 51.7 (H) 36.0 - 46.0 %   MCV 75.4 (L) 80.0 - 100.0 fL   MCH 21.3 (L) 26.0 - 34.0 pg   MCHC 28.2 (L) 30.0 - 36.0 g/dL   RDW 21.4 (H) 11.5 - 15.5 %   Platelets 356 150 - 400 K/uL   nRBC 0.0 0.0 - 0.2 %    Comment: Performed at [facility], [place] [address], [place], [place] [address]  Glucose, capillary     Status: Abnormal   Collection Time: 12/04/22  5:58 AM  Result Value Ref Range  Glucose-Capillary 112 (H) 70 - 99 mg/dL    Comment: Glucose reference range applies only to samples taken after fasting for at least 8 hours.  Blood gas, venous     Status: Abnormal   Collection Time: 12/04/22  9:42 AM  Result Value Ref Range   pH, Ven 7.41 7.25 - 7.43    pCO2, Ven 84 (HH) 44 - 60 mmHg    Comment: CRITICAL RESULT CALLED TO, READ BACK BY AND VERIFIED WITH: ALEX.JONES RN '@1000'$  03.08.2024 E.AHMED    pO2, Ven 40 32 - 45 mmHg   Bicarbonate 53.2 (H) 20.0 - 28.0 mmol/L   Acid-Base Excess 23.2 (H) 0.0 - 2.0 mmol/L   O2 Saturation 74 %   Patient temperature 36.9    Drawn by [id number]     Comment: Performed at [facility] [address], [place], [place] [address]  Glucose, capillary     Status: None   Collection Time: 12/04/22 11:38 AM  Result Value Ref Range   Glucose-Capillary 92 70 - 99 mg/dL    Comment: Glucose reference range applies only to samples taken after fasting for at least 8 hours.  Glucose, capillary     Status: Abnormal   Collection Time: 12/04/22  3:55 PM  Result Value Ref Range   Glucose-Capillary 130 (H) 70 - 99 mg/dL    Comment: Glucose reference range applies only to samples taken after fasting for at least 8 hours.  Glucose, capillary     Status: Abnormal   Collection Time: 12/04/22  9:06 PM  Result Value Ref Range   Glucose-Capillary 136 (H) 70 - 99 mg/dL    Comment: Glucose reference range applies only to samples taken after fasting for at least 8 hours.   Comment 1 Notify RN    Comment 2 Document in Chart   Basic metabolic panel     Status: Abnormal   Collection Time: 12/05/22 12:46 AM  Result Value Ref Range   Sodium 137 135 - 145 mmol/L   Potassium 3.5 3.5 - 5.1 mmol/L   Chloride 87 (L) 98 - 111 mmol/L   CO2 35 (H) 22 - 32 mmol/L   Glucose, Bld 90 70 - 99 mg/dL    Comment: Glucose reference range applies only to samples taken after fasting for at least 8 hours.   BUN 9 6 - 20 mg/dL   Creatinine, Ser 0.60 0.44 - 1.00 mg/dL   Calcium 8.8 (L) 8.9 - 10.3 mg/dL   GFR, Estimated >60 >60 mL/min    Comment: (NOTE) Calculated using the CKD-EPI Creatinine Equation (2021)    Anion gap 15 5 - 15    Comment: Performed at [facility] [address], [place], [place] [address]  CBC     Status: Abnormal    Collection Time: 12/05/22 12:46 AM  Result Value Ref Range   WBC 3.8 (L) 4.0 - 10.5 K/uL   RBC 6.96 (H) 3.87 - 5.11 MIL/uL   Hemoglobin 14.7 12.0 - 15.0 g/dL   HCT 51.3 (H) 36.0 - 46.0 %   MCV 73.7 (L) 80.0 - 100.0 fL   MCH 21.1 (L) 26.0 - 34.0 pg   MCHC 28.7 (L) 30.0 - 36.0 g/dL   RDW 21.1 (H) 11.5 - 15.5 %   Platelets 337 150 - 400 K/uL    Comment: REPEATED TO VERIFY   nRBC 0.5 (H) 0.0 - 0.2 %    Comment: Performed at [facility], [place],  [place] [address]  Glucose, capillary     Status: Abnormal   Collection Time: 12/05/22  6:00 AM  Result Value Ref Range   Glucose-Capillary 162 (H) 70 - 99 mg/dL    Comment: Glucose reference range applies only to samples taken after fasting for at least 8 hours.   Comment 1 Notify RN    Comment 2 Document in Chart     ECG   N/A  Telemetry   Sinus rhythm - Personally Reviewed  Radiology    ECHOCARDIOGRAM COMPLETE  Result Date: 12/04/2022    ECHOCARDIOGRAM REPORT   Patient Name:   PENNYE MACARIO Date of Exam: 12/04/2022 Medical Rec #:  [id number]      Height:       68.0 in Accession #:    [id number]     Weight:       251.0 lb Date of Birth:  04-14-1966     BSA:          2.251 m Patient Age:    39 years       BP:           148/84 mmHg Patient Gender: F              HR:           81 bpm. Exam Location:  Inpatient Procedure: 2D Echo, Cardiac Doppler and Color Doppler Indications:    CHF-Acute Diastolic [id number]  History:        Patient has prior history of Echocardiogram examinations, most                 recent 08/13/2022. CHF, Previous Myocardial Infarction,                 Arrythmias:Tachycardia, Signs/Symptoms:Dyspnea; Risk                 Factors:Hypertension, Diabetes and Dyslipidemia.  Sonographer:    Ronny Flurry Referring Phys: [id number] GRACE LAU  Sonographer Comments: Patient is obese. Image acquisition challenging due to respiratory motion. IMPRESSIONS  1. Left ventricular ejection fraction, by estimation, is 45%.  The left ventricle has mildly decreased function. The left ventricle demonstrates regional wall motion abnormalities with septal hypokinesis. There is mild concentric left ventricular hypertrophy. Left ventricular diastolic parameters are consistent with Grade I diastolic dysfunction (impaired relaxation).  2. Mildly D-shaped interventricular septum suggestive of RV pressure/volume overload. Right ventricular systolic function is moderately reduced. The right ventricular size is mildly enlarged. Tricuspid regurgitation signal is inadequate for assessing PA  pressure.  3. The mitral valve is normal in structure. Trivial mitral valve regurgitation. No evidence of mitral stenosis.  4. The aortic valve is tricuspid. Aortic valve regurgitation is not visualized. No aortic stenosis is present.  5. The inferior vena cava is normal in size with greater than 50% respiratory variability, suggesting right atrial pressure of 3 mmHg. FINDINGS  Left Ventricle: Left ventricular ejection fraction, by estimation, is 45%. The left ventricle has mildly decreased function. The left ventricle demonstrates regional wall motion abnormalities. The left ventricular internal cavity size was normal in size. There is mild concentric left ventricular hypertrophy. Left ventricular diastolic parameters are consistent with Grade I diastolic dysfunction (impaired relaxation). Right Ventricle: Mildly D-shaped interventricular septum suggestive of RV pressure/volume overload. The right ventricular size is mildly enlarged. No increase in right ventricular wall thickness. Right ventricular systolic function is moderately reduced.  Tricuspid regurgitation signal is inadequate for assessing PA pressure. Left Atrium: Left atrial size was normal in size.  Right Atrium: Right atrial size was normal in size. Pericardium: There is no evidence of pericardial effusion. The pericardial effusion is circumferential. Mitral Valve: The mitral valve is normal in  structure. Trivial mitral valve regurgitation. No evidence of mitral valve stenosis. Tricuspid Valve: The tricuspid valve is normal in structure. Tricuspid valve regurgitation is not demonstrated. Aortic Valve: The aortic valve is tricuspid. Aortic valve regurgitation is not visualized. No aortic stenosis is present. Aortic valve mean gradient measures 5.0 mmHg. Aortic valve peak gradient measures 8.4 mmHg. Aortic valve area, by VTI measures 3.21 cm. Pulmonic Valve: The pulmonic valve was normal in structure. Pulmonic valve regurgitation is trivial. Aorta: The aortic root is normal in size and structure. Venous: The inferior vena cava is normal in size with greater than 50% respiratory variability, suggesting right atrial pressure of 3 mmHg. IAS/Shunts: No atrial level shunt detected by color flow Doppler.  LEFT VENTRICLE PLAX 2D LVIDd:         4.90 cm   Diastology LVIDs:         3.70 cm   LV e' medial:    4.68 cm/s LV PW:         1.30 cm   LV E/e' medial:  11.4 LV IVS:        1.10 cm   LV e' lateral:   4.73 cm/s LVOT diam:     2.40 cm   LV E/e' lateral: 11.3 LV SV:         84 LV SV Index:   37 LVOT Area:     4.52 cm  RIGHT VENTRICLE             IVC RV S prime:     11.50 cm/s  IVC diam: 2.00 cm TAPSE (M-mode): 2.4 cm LEFT ATRIUM             Index        RIGHT ATRIUM           Index LA diam:        4.00 cm 1.78 cm/m   RA Area:     16.60 cm LA Vol (A2C):   57.0 ml 25.32 ml/m  RA Volume:   41.90 ml  18.62 ml/m LA Vol (A4C):   78.8 ml 35.01 ml/m LA Biplane Vol: 59.9 ml 26.61 ml/m  AORTIC VALVE AV Area (Vmax):    3.20 cm AV Area (Vmean):   3.12 cm AV Area (VTI):     3.21 cm AV Vmax:           145.00 cm/s AV Vmean:          98.400 cm/s AV VTI:            0.262 m AV Peak Grad:      8.4 mmHg AV Mean Grad:      5.0 mmHg LVOT Vmax:         102.63 cm/s LVOT Vmean:        67.767 cm/s LVOT VTI:          0.186 m LVOT/AV VTI ratio: 0.71  AORTA Ao Root diam: 3.40 cm Ao Asc diam:  3.30 cm MITRAL VALVE MV Area (PHT): 3.77  cm    SHUNTS MV Decel Time: 201 msec    Systemic VTI:  0.19 m MV E velocity: 53.40 cm/s  Systemic Diam: 2.40 cm MV A velocity: 76.20 cm/s MV E/A ratio:  0.70 Dalton McleanMD Electronically signed by Franki Monte Signature Date/Time: 12/04/2022/1:25:56 PM    Final  CT Angio Chest Pulmonary Embolism (PE) W or WO Contrast  Result Date: 12/03/2022 CLINICAL DATA:  Shortness of breath; dizziness and headache EXAM: CT ANGIOGRAPHY CHEST WITH CONTRAST TECHNIQUE: Multidetector CT imaging of the chest was performed using the standard protocol during bolus administration of intravenous contrast. Multiplanar CT image reconstructions and MIPs were obtained to evaluate the vascular anatomy. RADIATION DOSE REDUCTION: This exam was performed according to the departmental dose-optimization program which includes automated exposure control, adjustment of the mA and/or kV according to patient size and/or use of iterative reconstruction technique. CONTRAST:  86m OMNIPAQUE IOHEXOL 350 MG/ML SOLN COMPARISON:  Chest CT May 22, 2022 FINDINGS: Cardiovascular: No evidence of pulmonary embolus. Evaluation of the subsegmental pulmonary arteries is somewhat limited due to streak artifact. Cardiomegaly. New small pericardial effusion. Normal caliber thoracic aorta with mild atherosclerotic disease. Dilated main pulmonary artery, measuring up to 3.6 mm. Mediastinum/Nodes: Esophagus and thyroid are unremarkable. Mildly enlarged right hilar lymph nodes. Reference node measuring 1.4 cm on series 7, image 162, unchanged when compared with the prior exam and likely reactive. Lungs/Pleura: Central airways are patent. Mild mosaic attenuation. Interval resolution of right upper lobe consolidation. Mild bibasilar opacities, likely due to atelectasis. No pleural effusion or pneumothorax. Upper Abdomen: No acute abnormality. Musculoskeletal: No chest wall abnormality. No acute or significant osseous findings. Review of the MIP images confirms the  above findings. IMPRESSION: 1. No evidence of pulmonary embolus. 2. Cardiomegaly with new small pericardial effusion. 3. Mild bibasilar opacities which are likely due to atelectasis. 4. Mild mosaic attenuation, findings can be seen in the setting of small airways disease or mosaic perfusion secondary to pulmonary hypertension. 5. Dilated main pulmonary artery, findings can be seen in the setting of pulmonary hypertension. 6. Aortic Atherosclerosis (ICD10-I70.0). Electronically Signed   By: LYetta GlassmanM.D.   On: 12/03/2022 11:52    Cardiac Studies   See echo above  Assessment   Principal Problem:   Acute systolic (congestive) heart failure ([place]   Plan   Responding to diuresis - switched to oral lasix 80 mg BID today. Would monitor output with this. CO2 has improved - ordered for one dose of diamox today, but was already improving.   Time Spent Directly with Patient:  I have spent a total of 25 minutes with the patient reviewing hospital notes, telemetry, EKGs, labs and examining the patient as well as establishing an assessment and plan that was discussed personally with the patient.  > 50% of time was spent in direct patient care.  Length of Stay:  LOS: 3 days   KPixie Casino MD, FStillwater Hospital Association Inc FDona AnaDirector of the [facility] Lipid [facility] &  Cardiovascular Risk Reduction Clinic Diplomate of the American Board of Clinical Lipidology Attending Cardiologist  Direct Dial: [phone] Fax: [phone] Website:  [email or web address] 12/05/2022, 10:23 AM

## 2022-12-05 NOTE — Plan of Care (Signed)

## 2022-12-06 DIAGNOSIS — I5021 Acute systolic (congestive) heart failure: Secondary | ICD-10-CM | POA: Diagnosis not present

## 2022-12-06 DIAGNOSIS — I110 Hypertensive heart disease with heart failure: Secondary | ICD-10-CM | POA: Diagnosis not present

## 2022-12-06 DIAGNOSIS — R519 Headache, unspecified: Secondary | ICD-10-CM | POA: Diagnosis not present

## 2022-12-06 LAB — BASIC METABOLIC PANEL
Anion gap: 11 (ref 5–15)
BUN: 8 mg/dL (ref 6–20)
CO2: 34 mmol/L — ABNORMAL HIGH (ref 22–32)
Calcium: 9.2 mg/dL (ref 8.9–10.3)
Chloride: 91 mmol/L — ABNORMAL LOW (ref 98–111)
Creatinine, Ser: 0.73 mg/dL (ref 0.44–1.00)
GFR, Estimated: >60 mL/min (ref >60)
Glucose, Bld: 117 mg/dL — ABNORMAL HIGH (ref 70–99)
Potassium: 3.9 mmol/L (ref 3.5–5.1)
Sodium: 136 mmol/L (ref 135–145)

## 2022-12-06 LAB — GLUCOSE, CAPILLARY
Glucose-Capillary: 138 mg/dL — ABNORMAL HIGH (ref 70–99)
Glucose-Capillary: 155 mg/dL — ABNORMAL HIGH (ref 70–99)
Glucose-Capillary: 119 mg/dL — ABNORMAL HIGH (ref 70–99)

## 2022-12-06 NOTE — Progress Notes (Signed)
Approximately 1300--Notified by telemetry that pt's tele orders would be expiring around 1400 this afternoon. Messaged Delene Ruffini, MD. Per MD, RN may let order expire and discontinue telemetry at this time. Telemetry notified.

## 2022-12-06 NOTE — Progress Notes (Addendum)
DAILY PROGRESS NOTE   Patient Name: Claudia [name] Date of Encounter: 12/06/2022 Cardiologist: Candee Furbish, MD  Chief Complaint   No complaints  Patient Profile   Claudia [name] is a 57 y.o. female with a hx of HFpEF, NSVT, DM2, IDA, HTN, chronically on 2L O2 who is being seen 12/04/2022 for the evaluation of heart failure at the request of Dr. Saverio Danker   Subjective   Diuresed another 1.8L negative.  Creatinine stable. CO2 34 (From 35).  Objective   Vitals:   12/05/22 2052 12/06/22 0033 12/06/22 0407 12/06/22 0823  BP: 125/80 113/79 129/66 135/76  Pulse: 80 73 89 98  Resp: '20 20 20 18  '$ Temp: 98.4 F (36.9 C) 98.6 F (37 C) (!) 97.2 F (36.2 C) 98 F (36.7 C)  TempSrc: Oral Axillary Oral Oral  SpO2: 95% 97% 94% 97%  Weight:   111.8 kg   Height:        Intake/Output Summary (Last 24 hours) at 12/06/2022 0953 Last data filed at 12/06/2022 0825 Gross per 24 hour  Intake 840 ml  Output 3200 ml  Net -2360 ml   Filed Weights   12/04/22 0428 12/05/22 0535 12/06/22 0407  Weight: 113.9 kg 113.2 kg 111.8 kg    Physical Exam   General appearance: alert and no distress Lungs: clear to auscultation bilaterally Heart: regular rate and rhythm Extremities: extremities normal, atraumatic, no cyanosis or edema Neurologic: Grossly normal  Inpatient Medications    Scheduled Meds:  amLODipine  5 mg Oral Daily   aspirin EC  81 mg Oral Daily   dapagliflozin propanediol  10 mg Oral Daily   enoxaparin (LOVENOX) injection  0.5 mg/kg Subcutaneous Q24H   furosemide  80 mg Oral BID   metFORMIN  1,000 mg Oral BID WC   polyethylene glycol  17 g Oral Daily   sacubitril-valsartan  1 tablet Oral BID   spironolactone  25 mg Oral Daily    Continuous Infusions:   PRN Meds: [DISCONTINUED] acetaminophen **OR** acetaminophen, acetaminophen, mouth rinse   Labs   Results for orders placed or performed during the hospital encounter of 12/02/22 (from the past 48 hour(s))  Blood gas,  venous     Status: Abnormal   Collection Time: 12/04/22  9:42 AM  Result Value Ref Range   pH, Ven 7.41 7.25 - 7.43   pCO2, Ven 84 (HH) 44 - 60 mmHg    Comment: CRITICAL RESULT CALLED TO, READ BACK BY AND VERIFIED WITH: [email or web address] RN '@1000'$  03.08.2024 E.AHMED    pO2, Ven 40 32 - 45 mmHg   Bicarbonate 53.2 (H) 20.0 - 28.0 mmol/L   Acid-Base Excess 23.2 (H) 0.0 - 2.0 mmol/L   O2 Saturation 74 %   Patient temperature 36.9    Drawn by [id number]     Comment: Performed at [facility] [address] [place], [place] [address]  Glucose, capillary     Status: None   Collection Time: 12/04/22 11:38 AM  Result Value Ref Range   Glucose-Capillary 92 70 - 99 mg/dL    Comment: Glucose reference range applies only to samples taken after fasting for at least 8 hours.  Glucose, capillary     Status: Abnormal   Collection Time: 12/04/22  3:55 PM  Result Value Ref Range   Glucose-Capillary 130 (H) 70 - 99 mg/dL    Comment: Glucose reference range applies only to samples taken after fasting for at least 8 hours.  Glucose, capillary  Status: Abnormal   Collection Time: 12/04/22  9:06 PM  Result Value Ref Range   Glucose-Capillary 136 (H) 70 - 99 mg/dL    Comment: Glucose reference range applies only to samples taken after fasting for at least 8 hours.   Comment 1 Notify RN    Comment 2 Document in Chart   Basic metabolic panel     Status: Abnormal   Collection Time: 12/05/22 12:46 AM  Result Value Ref Range   Sodium 137 135 - 145 mmol/L   Potassium 3.5 3.5 - 5.1 mmol/L   Chloride 87 (L) 98 - 111 mmol/L   CO2 35 (H) 22 - 32 mmol/L   Glucose, Bld 90 70 - 99 mg/dL    Comment: Glucose reference range applies only to samples taken after fasting for at least 8 hours.   BUN 9 6 - 20 mg/dL   Creatinine, Ser 0.60 0.44 - 1.00 mg/dL   Calcium 8.8 (L) 8.9 - 10.3 mg/dL   GFR, Estimated >60 >60 mL/min    Comment: (NOTE) Calculated using the CKD-EPI Creatinine Equation (2021)    Anion gap 15 5 -  15    Comment: Performed at [facility] [address] [place], [place] [address]  CBC     Status: Abnormal   Collection Time: 12/05/22 12:46 AM  Result Value Ref Range   WBC 3.8 (L) 4.0 - 10.5 K/uL   RBC 6.96 (H) 3.87 - 5.11 MIL/uL   Hemoglobin 14.7 12.0 - 15.0 g/dL   HCT 51.3 (H) 36.0 - 46.0 %   MCV 73.7 (L) 80.0 - 100.0 fL   MCH 21.1 (L) 26.0 - 34.0 pg   MCHC 28.7 (L) 30.0 - 36.0 g/dL   RDW 21.1 (H) 11.5 - 15.5 %   Platelets 337 150 - 400 K/uL    Comment: REPEATED TO VERIFY   nRBC 0.5 (H) 0.0 - 0.2 %    Comment: Performed at [facility], [place] [address] [place], [place] [address]  Glucose, capillary     Status: Abnormal   Collection Time: 12/05/22  6:00 AM  Result Value Ref Range   Glucose-Capillary 162 (H) 70 - 99 mg/dL    Comment: Glucose reference range applies only to samples taken after fasting for at least 8 hours.   Comment 1 Notify RN    Comment 2 Document in Chart   Glucose, capillary     Status: Abnormal   Collection Time: 12/05/22 11:20 AM  Result Value Ref Range   Glucose-Capillary 120 (H) 70 - 99 mg/dL    Comment: Glucose reference range applies only to samples taken after fasting for at least 8 hours.   Comment 1 Notify RN    Comment 2 Document in Chart   Glucose, capillary     Status: Abnormal   Collection Time: 12/05/22  4:10 PM  Result Value Ref Range   Glucose-Capillary 129 (H) 70 - 99 mg/dL    Comment: Glucose reference range applies only to samples taken after fasting for at least 8 hours.   Comment 1 Notify RN    Comment 2 Document in Chart   Glucose, capillary     Status: Abnormal   Collection Time: 12/05/22  9:25 PM  Result Value Ref Range   Glucose-Capillary 141 (H) 70 - 99 mg/dL    Comment: Glucose reference range applies only to samples taken after fasting for at least 8 hours.  Glucose, capillary     Status: Abnormal  Collection Time: 12/06/22  6:30 AM  Result Value Ref Range   Glucose-Capillary 138 (H) 70 - 99 mg/dL     Comment: Glucose reference range applies only to samples taken after fasting for at least 8 hours.  Basic metabolic panel     Status: Abnormal   Collection Time: 12/06/22  7:50 AM  Result Value Ref Range   Sodium 136 135 - 145 mmol/L   Potassium 3.9 3.5 - 5.1 mmol/L   Chloride 91 (L) 98 - 111 mmol/L   CO2 34 (H) 22 - 32 mmol/L   Glucose, Bld 117 (H) 70 - 99 mg/dL    Comment: Glucose reference range applies only to samples taken after fasting for at least 8 hours.   BUN 8 6 - 20 mg/dL   Creatinine, Ser 0.73 0.44 - 1.00 mg/dL   Calcium 9.2 8.9 - 10.3 mg/dL   GFR, Estimated >60 >60 mL/min    Comment: (NOTE) Calculated using the CKD-EPI Creatinine Equation (2021)    Anion gap 11 5 - 15    Comment: Performed at [facility] [address] [place], [place] [address]    ECG   N/A  Telemetry   Sinus rhythm - Personally Reviewed  Radiology    ECHOCARDIOGRAM COMPLETE  Result Date: 12/04/2022    ECHOCARDIOGRAM REPORT   Patient Name:   Claudia [name] Date of Exam: 12/04/2022 Medical Rec #:  [id number]      Height:       68.0 in Accession #:    [id number]     Weight:       251.0 lb Date of Birth:  10/09/1965     BSA:          2.251 m Patient Age:    46 years       BP:           148/84 mmHg Patient Gender: F              HR:           81 bpm. Exam Location:  Inpatient Procedure: 2D Echo, Cardiac Doppler and Color Doppler Indications:    CHF-Acute Diastolic [id number]  History:        Patient has prior history of Echocardiogram examinations, most                 recent 08/13/2022. CHF, Previous Myocardial Infarction,                 Arrythmias:Tachycardia, Signs/Symptoms:Dyspnea; Risk                 Factors:Hypertension, Diabetes and Dyslipidemia.  Sonographer:    Ronny Flurry Referring Phys: [id number] GRACE LAU  Sonographer Comments: Patient is obese. Image acquisition challenging due to respiratory motion. IMPRESSIONS  1. Left ventricular ejection fraction, by estimation, is 45%. The left  ventricle has mildly decreased function. The left ventricle demonstrates regional wall motion abnormalities with septal hypokinesis. There is mild concentric left ventricular hypertrophy. Left ventricular diastolic parameters are consistent with Grade I diastolic dysfunction (impaired relaxation).  2. Mildly D-shaped interventricular septum suggestive of RV pressure/volume overload. Right ventricular systolic function is moderately reduced. The right ventricular size is mildly enlarged. Tricuspid regurgitation signal is inadequate for assessing PA  pressure.  3. The mitral valve is normal in structure. Trivial mitral valve regurgitation. No evidence of mitral stenosis.  4. The aortic valve is tricuspid. Aortic valve regurgitation is not visualized. No aortic stenosis is present.  5.  The inferior vena cava is normal in size with greater than 50% respiratory variability, suggesting right atrial pressure of 3 mmHg. FINDINGS  Left Ventricle: Left ventricular ejection fraction, by estimation, is 45%. The left ventricle has mildly decreased function. The left ventricle demonstrates regional wall motion abnormalities. The left ventricular internal cavity size was normal in size. There is mild concentric left ventricular hypertrophy. Left ventricular diastolic parameters are consistent with Grade I diastolic dysfunction (impaired relaxation). Right Ventricle: Mildly D-shaped interventricular septum suggestive of RV pressure/volume overload. The right ventricular size is mildly enlarged. No increase in right ventricular wall thickness. Right ventricular systolic function is moderately reduced.  Tricuspid regurgitation signal is inadequate for assessing PA pressure. Left Atrium: Left atrial size was normal in size. Right Atrium: Right atrial size was normal in size. Pericardium: There is no evidence of pericardial effusion. The pericardial effusion is circumferential. Mitral Valve: The mitral valve is normal in structure.  Trivial mitral valve regurgitation. No evidence of mitral valve stenosis. Tricuspid Valve: The tricuspid valve is normal in structure. Tricuspid valve regurgitation is not demonstrated. Aortic Valve: The aortic valve is tricuspid. Aortic valve regurgitation is not visualized. No aortic stenosis is present. Aortic valve mean gradient measures 5.0 mmHg. Aortic valve peak gradient measures 8.4 mmHg. Aortic valve area, by VTI measures 3.21 cm. Pulmonic Valve: The pulmonic valve was normal in structure. Pulmonic valve regurgitation is trivial. Aorta: The aortic root is normal in size and structure. Venous: The inferior vena cava is normal in size with greater than 50% respiratory variability, suggesting right atrial pressure of 3 mmHg. IAS/Shunts: No atrial level shunt detected by color flow Doppler.  LEFT VENTRICLE PLAX 2D LVIDd:         4.90 cm   Diastology LVIDs:         3.70 cm   LV e' medial:    4.68 cm/s LV PW:         1.30 cm   LV E/e' medial:  11.4 LV IVS:        1.10 cm   LV e' lateral:   4.73 cm/s LVOT diam:     2.40 cm   LV E/e' lateral: 11.3 LV SV:         84 LV SV Index:   37 LVOT Area:     4.52 cm  RIGHT VENTRICLE             IVC RV S prime:     11.50 cm/s  IVC diam: 2.00 cm TAPSE (M-mode): 2.4 cm LEFT ATRIUM             Index        RIGHT ATRIUM           Index LA diam:        4.00 cm 1.78 cm/m   RA Area:     16.60 cm LA Vol (A2C):   57.0 ml 25.32 ml/m  RA Volume:   41.90 ml  18.62 ml/m LA Vol (A4C):   78.8 ml 35.01 ml/m LA Biplane Vol: 59.9 ml 26.61 ml/m  AORTIC VALVE AV Area (Vmax):    3.20 cm AV Area (Vmean):   3.12 cm AV Area (VTI):     3.21 cm AV Vmax:           145.00 cm/s AV Vmean:          98.400 cm/s AV VTI:            0.262 m AV Peak Grad:  8.4 mmHg AV Mean Grad:      5.0 mmHg LVOT Vmax:         102.63 cm/s LVOT Vmean:        67.767 cm/s LVOT VTI:          0.186 m LVOT/AV VTI ratio: 0.71  AORTA Ao Root diam: 3.40 cm Ao Asc diam:  3.30 cm MITRAL VALVE MV Area (PHT): 3.77 cm     SHUNTS MV Decel Time: 201 msec    Systemic VTI:  0.19 m MV E velocity: 53.40 cm/s  Systemic Diam: 2.40 cm MV A velocity: 76.20 cm/s MV E/A ratio:  0.70 Dalton McleanMD Electronically signed by Franki Monte Signature Date/Time: 12/04/2022/1:25:56 PM    Final     Cardiac Studies   See echo above  Assessment   Principal Problem:   Acute systolic (congestive) heart failure (HCC)   Plan   Seems to be stable on oral lasix - given diamox yesterday with minimal change on CO2. Would not continue as it is near normal. On appropriate GDMT. Probably can be discharged today from my standpoint.  Follow-up with Dr. Marlou Porch or APP. Needs to be fitted with CPAP device at home. Will alert the CPAP coordinator.  [place] will sign off.   Medication Recommendations:  as above Other recommendations (labs, testing, etc):  none Follow up as an outpatient:  Dr. Marlou Porch or APP   Time Spent Directly with Patient:  I have spent a total of 25 minutes with the patient reviewing hospital notes, telemetry, EKGs, labs and examining the patient as well as establishing an assessment and plan that was discussed personally with the patient.  > 50% of time was spent in direct patient care.  Length of Stay:  LOS: 4 days   Pixie Casino, MD, [facility], [place] Director of the Advanced Lipid Disorders &  Cardiovascular Risk Reduction Clinic Diplomate of the American Board of Clinical Lipidology Attending Cardiologist  Direct Dial: [phone]  Fax: [phone]  Website:  [email or web address] 12/06/2022, 9:53 AM

## 2022-12-06 NOTE — Progress Notes (Signed)
HD#4 Subjective:   Summary: Claudia Baxter L. [place] is a 57 year old female with a past medical history significant for HTN, HLD, CAD, NSTEMI, pSVT, HFpEF 50-55%, obesity, poorly controlled T2DM, ACS, IDA, C5-C6 ACDF, OA, chronic oxygen use (2L) who presents to the ED for shortness of breath and was admitted for CHF exacerbation.   Overnight Events: none  Patient feeling well this morning. No new concerns. Tolerated CPAP overnight. Able to eat her breakfast.   Objective:  Vital signs in last 24 hours: Vitals:   12/05/22 1525 12/05/22 2052 12/06/22 0033 12/06/22 0407  BP: 132/77 125/80 113/79 129/66  Pulse: 87 80 73 89  Resp: '20 20 20 20  '$ Temp: 97.8 F (36.6 C) 98.4 F (36.9 C) 98.6 F (37 C) (!) 97.2 F (36.2 C)  TempSrc: Oral Oral Axillary Oral  SpO2:  95% 97% 94%  Weight:    111.8 kg  Height:       Supplemental O2: Nasal Cannula SpO2: 94 % O2 Flow Rate (L/min): 4 L/min FiO2 (%): 45 %   Physical Exam:  Constitutional: well-developed, well-nourished female sitting in bed, in no acute distress HENT: normocephalic atraumatic Cardiovascular: regular rate and rhythm, no murmurs, no LE edema Pulmonary/Chest: normal work of breathing on 4L [place], lungs clear to auscultation bilaterally  MSK: normal bulk and tone Neurological: alert & oriented x 3 Skin: warm and dry Psych: pleasant mood   Filed Weights   12/04/22 0428 12/05/22 0535 12/06/22 0407  Weight: 113.9 kg 113.2 kg 111.8 kg     Intake/Output Summary (Last 24 hours) at 12/06/2022 0627 Last data filed at 12/06/2022 0418 Gross per 24 hour  Intake 780 ml  Output 2600 ml  Net -1820 ml   Net IO Since Admission: -3,870 mL [12/06/22 0627]  Pertinent Labs:    Latest Ref Rng & Units 12/05/2022   12:46 AM 12/04/2022    1:07 AM 12/03/2022    4:42 PM  CBC  WBC 4.0 - 10.5 K/uL 3.8  4.5    Hemoglobin 12.0 - 15.0 g/dL 14.7  14.6  16.7   Hematocrit 36.0 - 46.0 % 51.3  51.7  49.0   Platelets 150 - 400 K/uL 337  356          Latest Ref Rng & Units 12/05/2022   12:46 AM 12/04/2022    1:07 AM 12/03/2022    4:42 PM  CMP  Glucose 70 - 99 mg/dL 90  124    BUN 6 - 20 mg/dL 9  8    Creatinine 0.44 - 1.00 mg/dL 0.60  0.72    Sodium 135 - 145 mmol/L 137  140  139   Potassium 3.5 - 5.1 mmol/L 3.5  3.9  4.1   Chloride 98 - 111 mmol/L 87  91    CO2 22 - 32 mmol/L 35  43    Calcium 8.9 - 10.3 mg/dL 8.8  8.6      Imaging: No results found.  Assessment/Plan:   Principal Problem:   Acute systolic (congestive) heart failure ([place])   Patient Summary: Claudia Baxter L. [place] is a 57 year old female with a past medical history significant for HTN, HLD, CAD, NSTEMI, pSVT, HFpEF 50-55%, obesity, poorly controlled T2DM, ACS, IDA, C5-C6 ACDF, OA, chronic oxygen use (2L) who presents for shortness of breath and was admitted for CHF exacerbation.   HFmrEF (EF 45%) Chronic oxygen use (2L) Hx of OSA HTN Presented w/ worsening exertional dyspnea. Likely in setting of HFpEF and untreated OSA. Repeat  echo showed EF reduced 45% with RWMA with septal hypokinesis and G1DD. Received Lasix po 80 mg BID and Diamox one time yesterday with documented output of 2600 ml. Bicarb remains ~34, no further Diamox. Volume status improving. Continue po Lasix today, kidney function normal. Currently on 4L [place], will continue to wean back to baseline 2L [place]. Cardiology signing off with outpatient f/u. Patient will need to obtain home CPAP device.  -continue Lasix 80 mg po BID -continue Entresto 49-51 mg BID -continue spironolactone 25 mg daily  -continue dapagliflozin 10 mg daily  -continue amlodipine 5 mg daily  -strict I&Os -daily weights -CPAP QHS -PT recommends outpatient f/u  Dizziness Vision changes Hearing difficulty Presented with intermittent dizziness, floaters in her vision with flashes of color, uncontrollable twitching of upper and lower extremities and decreased hearing of both ears for several months.  CT head negative.  MRI brain negative but  noted diffuse superficial signal abnormality either artifact versus chronic CSF leak.  Discussed with radiologist and he described superficial siderosis most commonly seen in IV iron infusions or recurrent clinical SAH. Not commonly seen with iron tablets. A diagnostic cerebral angiogram would best evaluate for recurrent SAH in outpatient setting.  -consider outpatient vestibular rehab  -consider outpatient cerebral angiogram for persistent symptoms   T2DM Obesity A1c 6.9%. Home meds include farxiga 10 mg daily, glipizide 10 mg BID, metformin 1000 mg BID, semaglutide 0.5 mg weekly. CBG has been within goal.  -CBG monitoring  -continue farxiga  -continue metformin -consider starting SSI if not controlled   Hx of NSTEMI CAD HLD Patient on ASA 81 mg daily. Noted to not tolerate statin therapy on allergy list. Last LDL 140.  -consider initiating Zetia at discharge   IDA 07/2022 iron panel showed normal iron level, elevated TIBC 557, and decreased saturation ratio. Ferritin low at 9. On ferrous sulfate 325 mg daily at home. -trend CBC  OA Hx of C5-C6 ACDF -tylenol PRN   Diet: Heart Healthy IVF: None,None VTE: Enoxaparin Code: Full PT/OT recs:  outpatient PT, no OT f/u  Dispo: Anticipated discharge to Home pending diuresis and oxygen requirement to baseline.   Angelique Blonder, DO Internal Medicine Resident PGY-1 Pager: [phone] Please contact the on call pager after 5 pm and on weekends at [phone].

## 2022-12-06 NOTE — Plan of Care (Signed)

## 2022-12-07 DIAGNOSIS — R519 Headache, unspecified: Secondary | ICD-10-CM

## 2022-12-07 LAB — GLUCOSE, CAPILLARY
Glucose-Capillary: 113 mg/dL — ABNORMAL HIGH (ref 70–99)
Glucose-Capillary: 163 mg/dL — ABNORMAL HIGH (ref 70–99)
Glucose-Capillary: 135 mg/dL — ABNORMAL HIGH (ref 70–99)

## 2022-12-07 MED ORDER — FUROSEMIDE 80 MG PO TABS: 80.0000 mg | ORAL_TABLET | Freq: Two times a day (BID) | ORAL | 0 refills | Status: DC

## 2022-12-07 NOTE — Discharge Instructions (Addendum)
You were hospitalized for shortness of breath. Your Lasix was increased to 80 mg twice daily to help with removing extra fluid. We started you on CPAP at night which you will continue at home. Please continue with your home oxygen of 2-3 L. I am glad to see you are feeling better. Please go to your appointments with cardiology, Dr. Marlou [name], and your primary care, Claudia [name]. Thank you for allowing [place] to be part of your care.   Please make sure to go to your follow up appointments:  -Cardiology: Dr. Candee [name] -Primary Care: Claudia Arms, NP  Please note these changes made to your medications:  *Please START taking:  -Lasix 80 mg twice daily  *Please STOP taking:  -Hydralazine  -Glipizide   Please make sure to follow up with your cardiologist and primary care doctor.

## 2022-12-07 NOTE — Discharge Summary (Cosign Needed Addendum)
Name: Claudia [name] MRN: [id number] DOB: 1966-05-13 57 y.o. PCP: Fenton Foy, NP  Date of Admission: 12/02/2022  6:25 AM Date of Discharge: 12/07/22 Attending Physician: Dr. Angelia Mould  Discharge Diagnosis: Principal Problem:   Acute systolic (congestive) heart failure (HCC) Active Problems:   Type 2 diabetes mellitus with hyperlipidemia (HCC)   Acute on chronic hypoxic respiratory failure (HCC)   Nonintractable episodic headache    Discharge Medications: Allergies as of 12/07/2022       Reactions   Beef-derived Products    Patient does not eat beef   Crestor [rosuvastatin]    MUSCLE ACHES HEADACHES   Diltiazem Hives   Hydrocodone Itching   Other Hives, Swelling, Other (See Comments)   Vision changes; ALLERGIC TO ALL NUTS EXCEPT PEANUTS   Oxycodone Itching   Pork-derived Products    For dietary, patient does not eat pork   Pravachol [pravastatin Sodium] Itching   Joint pain   Simvastatin Hives        Medication List     STOP taking these medications    glipiZIDE 10 MG tablet Commonly known as: GLUCOTROL   hydrALAZINE 25 MG tablet Commonly known as: APRESOLINE       TAKE these medications    Accu-Chek Guide test strip Generic drug: glucose blood Use as instructed to check sugar 4 times daily.   Accu-Chek Guide w/Device Kit 1 each by Does not apply route in the morning, at noon, in the evening, and at bedtime.   accu-chek multiclix lancets Use as instructed to check blood sugar 4 times daily.   acetaminophen 500 MG tablet Commonly known as: TYLENOL Take 500 mg by mouth every 6 (six) hours as needed for mild pain or moderate pain.   amLODipine 5 MG tablet Commonly known as: NORVASC Take 1 tablet (5 mg total) by mouth daily.   aspirin EC 81 MG tablet Take 1 tablet (81 mg total) by mouth daily.   blood glucose meter kit and supplies Kit Dispense based on patient and insurance preference. Use up to four times daily as directed. (FOR ICD-9  250.00, 250.01).   dapagliflozin propanediol 10 MG Tabs tablet Commonly known as: FARXIGA Take 1 tablet (10 mg total) by mouth daily.   Entresto 49-51 MG Generic drug: sacubitril-valsartan Take 1 tablet by mouth 2 (two) times daily.   ferrous sulfate 325 (65 FE) MG tablet Take 1 tablet (325 mg total) by mouth daily with breakfast.   Fish Oil 1000 MG Caps Take 1,000 mg by mouth daily.   furosemide 80 MG tablet Commonly known as: LASIX Take 1 tablet (80 mg total) by mouth 2 (two) times daily. What changed:  medication strength how much to take when to take this reasons to take this   metFORMIN 1000 MG tablet Commonly known as: GLUCOPHAGE TAKE 1 TABLET BY MOUTH TWICE DAILY WITH A MEAL   OVER THE COUNTER MEDICATION Take 1 capsule by mouth daily. Herbal Supplement  -- unknown name   Semaglutide(0.25 or 0.'5MG'$ /DOS) 2 MG/3ML Sopn Inject 0.5 mg into the skin once a week.   spironolactone 25 MG tablet Commonly known as: ALDACTONE Take 1 tablet (25 mg total) by mouth daily.        Disposition and follow-up:   [name] was discharged from [facility] in stable condition.  At the hospital follow up visit please address:  1.  Follow-up:  a.  CHF and OSA: assess O2 requirements, if tolerating CPAP, and adherence  to medications     b. HLD: Consider adding Zetia given patient is unable to tolerate statins    c. Consider a lower dose of metoprolol given dizziness and history of CAD.  d. T2DM: assess glycemic control, glipizide was not resumed during hospitalization  2.  Labs / imaging needed at time of follow-up: BMP  3.  Medication Changes  -Lasix 80 mg BID  -STOP glipizide and hydralazine  Follow-up Appointments: PCP: 12/11/22 2:40pm with Lazaro Arms, NP Cardiology: 12/24/22 9:00am with Glori Bickers, MD   Hospital Course by problem list:  Claudia [name] is a 57 year old female with a past medical history significant for HTN, HLD, CAD,  NSTEMI, pSVT, HFpEF 50-55%, obesity, poorly controlled T2DM, ACS, IDA, C5-C6 ACDF, OA, chronic oxygen use (2L) who presents to the ED for shortness of breath and was admitted for CHF exacerbation.    HFmrEF (EF 45%) Chronic oxygen use (2L) Hx of OSA HTN Presented to the ED on 12/02/22 with exertional dyspnea for several months. EF 55-60% w/ mild LVH and moderately reduced RV systolic function on echo from 07/2022. CXR showed cardiomegaly, vascular congestion, and possible mild pulmonary interstitial edema. BNP elevated on admission. EKG without signs of ischemia. CTA negative for PE but demonstrates cardiomegaly with new small pericardial effusion, mild bibasilar opacities suspicious for atelectasis, mild mosaic attenuation suspicious for small airway disease versus pulmonary hypertension, dilated main pulmonary artery. Started on IV lasix for diuresis. Suspected that symptoms were secondary to combination of HFpEF and untreated OSA. Patient had desaturated overnight on 2L O2 while on CPAP, was transitioned to Bipap. Patient's breathing status improved and was taken off BiPAP 3/8. Repeat echo showed EF reduced 45% with RWMA with septal hypokinesis and G1DD. Diamox has been added on 3/9 for one dose to further assist with oral Lasix and her CO2 retention, with documented output of 2677m. Volume status improved and slowly weaned supplemental oxygen down to 3L nasal cannula. Patient to be discharged on Lasix 80 mg BID and has follow up with cardiology. TOC assisted with getting CPAP on discharge along with outpatient physical therapy.   Dizziness Vision Changes Hearing Difficulty Presented with several months of intermittent dizziness, floaters in her vision with flashes of color, uncontrollable twitching of upper and lower eyelids, more recently decreased hearing from both the ears. CT head and MRI brain negative. Neuro exam remains normal. Suspect vision changes may be secondary to hyperglycemia associated  with T2DM. Dizziness may be secondary to CHF/OSA. Cause of her neurological symptoms remain unknown at this time. Suspect patient would benefit from vestibular rehab vs outpatient EMG. Can consider outpatient PT to do dix-hallpike to assess for BPPV following discharge.   T2DM Obesity Hx of poorly controlled diabetes. A1c 9.3% in 07/2022. Taking farxiga 10 mg daily, glipizide 10 mg BID, metformin 1000 mg BID, and semaglutide 0.5 mg weekly at home. CBGs were mostly within goal. Glipizide was not restarted during hospitalization, appears plan was to wean off of glipizide per prior office notes.    Transaminitis  Elevated AST 48, ALT 47 on admission. Was noted to have mild scleral icterus bilaterally w/o jaundice. Denied IV drug use.  No alcohol use. Recent hepatitis C screening negative. Hepatitis A IgM and Hepatitis B Eab negative at that time. Considered steatohepatitis vs vascular congestion in setting of CHF. AST/ALT normalized prior to discharge.    Hx of ACS Hx of NSTEMI CAD HLD On aspirin 81 mg daily. Has prior allergies to pravastatin, rosuvastatin, and atorvastatin. Lipid  panel here WNL w/ exception of LDL 140. Could consider starting Zetia in outpatient setting, patient to discuss with PCP.    IDA Iron panel from 3 months ago w/ normal iron, elevated TIBC at 557, and decreased saturation ratios. Ferritin was decreased at 9. On Ferrous sulfate 325 mg daily at home.    Discharge Subjective: Patient slept well, has good appetite, no pain, and feels ready to go home. She is agreeable with discharging on 3L O2 and working towards her baseline of 2L after discharge at home.   Discharge Exam:   BP (!) 103/58 (BP Location: Right Arm)   Pulse 72   Temp 98.4 F (36.9 C) (Oral)   Resp 18   Ht '5\' 8"'$  (1.727 m)   Wt 112.4 kg   LMP 05/09/2012   SpO2 95%   BMI 37.69 kg/m  Constitutional: well-appearing female sitting in bed, in no acute distress HENT: normocephalic atraumatic Neck:  supple Cardiovascular: regular rate, no LE edema Pulmonary/Chest: normal work of breathing on 3L [place], lungs clear to ausculation bilaterally  MSK: normal bulk and tone Neurological: alert & oriented x 3 Skin: warm and dry Psych: pleasant mood   Pertinent Labs, Studies, and Procedures:     Latest Ref Rng & Units 12/05/2022   12:46 AM 12/04/2022    1:07 AM 12/03/2022    4:42 PM  CBC  WBC 4.0 - 10.5 K/uL 3.8  4.5    Hemoglobin 12.0 - 15.0 g/dL 14.7  14.6  16.7   Hematocrit 36.0 - 46.0 % 51.3  51.7  49.0   Platelets 150 - 400 K/uL 337  356         Latest Ref Rng & Units 12/06/2022    7:50 AM 12/05/2022   12:46 AM 12/04/2022    1:07 AM  CMP  Glucose 70 - 99 mg/dL 117  90  124   BUN 6 - 20 mg/dL '8  9  8   '$ Creatinine 0.44 - 1.00 mg/dL 0.73  0.60  0.72   Sodium 135 - 145 mmol/L 136  137  140   Potassium 3.5 - 5.1 mmol/L 3.9  3.5  3.9   Chloride 98 - 111 mmol/L 91  87  91   CO2 22 - 32 mmol/L 34  35  43   Calcium 8.9 - 10.3 mg/dL 9.2  8.8  8.6     CT Angio Chest Pulmonary Embolism (PE) W or WO Contrast  Result Date: 12/03/2022 CLINICAL DATA:  Shortness of breath; dizziness and headache EXAM: CT ANGIOGRAPHY CHEST WITH CONTRAST TECHNIQUE: Multidetector CT imaging of the chest was performed using the standard protocol during bolus administration of intravenous contrast. Multiplanar CT image reconstructions and MIPs were obtained to evaluate the vascular anatomy. RADIATION DOSE REDUCTION: This exam was performed according to the departmental dose-optimization program which includes automated exposure control, adjustment of the mA and/or kV according to patient size and/or use of iterative reconstruction technique. CONTRAST:  64m OMNIPAQUE IOHEXOL 350 MG/ML SOLN COMPARISON:  Chest CT May 22, 2022 FINDINGS: Cardiovascular: No evidence of pulmonary embolus. Evaluation of the subsegmental pulmonary arteries is somewhat limited due to streak artifact. Cardiomegaly. New small pericardial effusion. Normal  caliber thoracic aorta with mild atherosclerotic disease. Dilated main pulmonary artery, measuring up to 3.6 mm. Mediastinum/Nodes: Esophagus and thyroid are unremarkable. Mildly enlarged right hilar lymph nodes. Reference node measuring 1.4 cm on series 7, image 162, unchanged when compared with the prior exam and likely reactive. Lungs/Pleura: Central airways are  patent. Mild mosaic attenuation. Interval resolution of right upper lobe consolidation. Mild bibasilar opacities, likely due to atelectasis. No pleural effusion or pneumothorax. Upper Abdomen: No acute abnormality. Musculoskeletal: No chest wall abnormality. No acute or significant osseous findings. Review of the MIP images confirms the above findings. IMPRESSION: 1. No evidence of pulmonary embolus. 2. Cardiomegaly with new small pericardial effusion. 3. Mild bibasilar opacities which are likely due to atelectasis. 4. Mild mosaic attenuation, findings can be seen in the setting of small airways disease or mosaic perfusion secondary to pulmonary hypertension. 5. Dilated main pulmonary artery, findings can be seen in the setting of pulmonary hypertension. 6. Aortic Atherosclerosis (ICD10-I70.0). Electronically Signed   By: Yetta Glassman M.D.   On: 12/03/2022 11:52   CT Head Wo Contrast  Result Date: 12/02/2022 CLINICAL DATA:  Headache and dizziness EXAM: CT HEAD WITHOUT CONTRAST TECHNIQUE: Contiguous axial images were obtained from the base of the skull through the vertex without intravenous contrast. RADIATION DOSE REDUCTION: This exam was performed according to the departmental dose-optimization program which includes automated exposure control, adjustment of the mA and/or kV according to patient size and/or use of iterative reconstruction technique. COMPARISON:  Same day brain MRI FINDINGS: Brain: No evidence of acute infarction, hemorrhage, hydrocephalus, extra-axial collection or mass lesion/mass effect. Enlarged and partially empty sella.  Vascular: No hyperdense vessel or unexpected calcification. Skull: Normal. Negative for fracture or focal lesion. Sinuses/Orbits: No middle ear or mastoid effusion. Paranasal sinuses are notable for polypoid mucosal thickening of the floor of the left maxillary sinus. Orbits are unremarkable. Other: None IMPRESSION: No acute intracranial abnormality. Electronically Signed   By: Marin Roberts M.D.   On: 12/02/2022 13:15   DG Chest 2 View  Result Date: 12/02/2022 CLINICAL DATA:  Shortness of breath EXAM: CHEST - 2 VIEW COMPARISON:  Chest radiograph 08/15/2022. FINDINGS: The heart is significantly enlarged, unchanged. The upper mediastinal contours are normal. There is vascular congestion and possible mild pulmonary interstitial edema. There is no focal consolidation. There is no pleural effusion or pneumothorax There is no acute osseous abnormality. IMPRESSION: Cardiomegaly with vascular congestion and possible mild pulmonary interstitial edema. Electronically Signed   By: Valetta Mole M.D.   On: 12/02/2022 12:34   MR BRAIN WO CONTRAST  Result Date: 12/02/2022 CLINICAL DATA:  Headache, neuro deficit. Headache and dizziness since November. EXAM: MRI HEAD WITHOUT CONTRAST TECHNIQUE: Multiplanar, multiecho pulse sequences of the brain and surrounding structures were obtained without intravenous contrast. COMPARISON:  Head CT 05/01/2013. FINDINGS: Brain: No acute infarct or hemorrhage. No mass or midline shift. No hydrocephalus or extra-axial collection. Questionable diffuse superficial signal abnormality on susceptibility weighted imaging is favored to represent artifact given uniformity and lack of correlation on other sequences. Vascular: Normal flow voids. Skull and upper cervical spine: Normal marrow signal. Sinuses/Orbits: Mild mucosal disease in the left sphenoid sinus. Orbits are unremarkable. Other: None. IMPRESSION: 1. No acute intracranial abnormality or mass. 2. Diffuse superficial signal abnormality on  susceptibility weighted imaging is favored to represent artifact given uniformity and lack of correlation on other sequences. Iron infusions may also contribute to this appearance. If real, superficial siderosis can be seen in the setting of chronic spinal CSF leak. Correlate for history of orthostatic headaches. Consider repeat MRI of the brain without and with contrast on a different scanner. Electronically Signed   By: Emmit Alexanders M.D.   On: 12/02/2022 09:30     Discharge Instructions:   Discharge Instructions      You were  hospitalized for shortness of breath. Your Lasix was increased to 80 mg twice daily to help with removing extra fluid. We started you on CPAP at night which you will continue at home. Please continue with your home oxygen of 2-3 L. I am glad to see you are feeling better. Please go to your appointments with cardiology, Dr. Marlou Porch, and your primary care, Lazaro Arms. Thank you for allowing [place] to be part of your care.   Please make sure to go to your follow up appointments:  -Cardiology: Dr. Candee Furbish -Primary Care: Lazaro Arms, NP  Please note these changes made to your medications:  *Please START taking:  -Lasix 80 mg twice daily  *Please STOP taking:  -Hydralazine  -Glipizide   Please make sure to follow up with your cardiologist and primary care doctor.     Signed: Marisa Cyphers, Medical Student 12/07/2022, 6:34 PM    Attestation for Student Documentation:  I personally was present and performed or re-performed the history, physical exam and medical decision-making activities of this service and have verified that the service and findings are accurately documented in the student's note.  Angelique Blonder, DO 12/07/2022, 6:40 PM

## 2022-12-07 NOTE — TOC Transition Note (Signed)
Transition of Care [facility]) - CM/SW Discharge Note   Patient Details  Name: Claudia [name] MRN: [id number] Date of Birth: 1965/10/13  Transition of Care [facility]) CM/SW Contact:  Zenon Mayo, RN Phone Number: 12/07/2022, 4:51 PM   Clinical Narrative:    Cyril Mourning with [employer] states a Respiratory therapist is coming to set up the cpap machine in patient's room today. MD will dc patient after this has been set up in room for patient.  Patient is also set up with outpatient physical therapy on [facility]. Referral sent thru epic.  He spouse will be transporting her home.  Patient has her own oxygen in the room.           Patient Goals and CMS Choice      Discharge Placement                         Discharge Plan and Services Additional resources added to the After Visit Summary for                                       Social Determinants of Health (SDOH) Interventions SDOH Screenings   Food Insecurity: No Food Insecurity (12/02/2022)  Housing: Low Risk  (12/02/2022)  Transportation Needs: No Transportation Needs (12/02/2022)  Utilities: Not At Risk (12/02/2022)  Alcohol Screen: Low Risk  (08/14/2022)  Depression (PHQ2-9): Low Risk  (02/03/2022)  Financial Resource Strain: Medium Risk (08/14/2022)  Tobacco Use: Low Risk  (12/02/2022)     Readmission Risk Interventions     No data to display

## 2022-12-07 NOTE — Progress Notes (Cosign Needed Addendum)
HD#5 SUBJECTIVE:  Patient Summary: Claudia [name] is a 57 y.o. with a pertinent PMH of HTN, HLD, CAD, NSTEMI, pSVT, HFpEF 50-55%, obesity, poorly controlled T2DM, ACS, IDA, C5-C6 ACDF, OA, chronic oxygen use (2L) who presents to the ED for shortness of breath and was admitted for CHF exacerbation.    Overnight Events: None. Patient feeling well this morning. She was able to sleep well and has good appetite. No new concerns.   OBJECTIVE:  Vital Signs: Vitals:   12/07/22 0400 12/07/22 0800 12/07/22 0905 12/07/22 1118  BP: 127/80 (!) 103/58  101/62  Pulse: 76 84 72 89  Resp: '18 17 18 18  '$ Temp: 98.6 F (37 C) 98.4 F (36.9 C)  98.4 F (36.9 C)  TempSrc: Oral Oral  Oral  SpO2: 96% 96% 95% 96%  Weight: 112.4 kg     Height:       Supplemental O2: Nasal Cannula SpO2: 96 % O2 Flow Rate (L/min): 3 L/min FiO2 (%): 45 %  Filed Weights   12/05/22 0535 12/06/22 0407 12/07/22 0400  Weight: 113.2 kg 111.8 kg 112.4 kg     Intake/Output Summary (Last 24 hours) at 12/07/2022 1403 Last data filed at 12/07/2022 1300 Gross per 24 hour  Intake 580 ml  Output 1000 ml  Net -420 ml   Net IO Since Admission: -4,630 mL [12/07/22 1403]  Physical Exam: Physical Exam Constitutional:      General: She is not in acute distress. HENT:     Head: Normocephalic and atraumatic.     Mouth/Throat:     Mouth: Mucous membranes are moist.  Cardiovascular:     Rate and Rhythm: Normal rate and regular rhythm.  Pulmonary:     Effort: Pulmonary effort is normal. No respiratory distress.     Breath sounds: Normal breath sounds.  Abdominal:     General: Bowel sounds are normal.     Palpations: Abdomen is soft.  Musculoskeletal:        General: No tenderness or signs of injury. Normal range of motion.  Skin:    General: Skin is warm and dry.  Neurological:     General: No focal deficit present.     Mental Status: She is alert and oriented to person, place, and time.  Psychiatric:        Mood and  Affect: Mood normal.        Behavior: Behavior normal.     Patient Lines/Drains/Airways Status     Active Line/Drains/Airways     Name Placement date Placement time Site Days   Peripheral IV 12/02/22 18 G Left;Proximal;Posterior Forearm 12/02/22  0653  Forearm  5            Pertinent Labs:    Latest Ref Rng & Units 12/05/2022   12:46 AM 12/04/2022    1:07 AM 12/03/2022    4:42 PM  CBC  WBC 4.0 - 10.5 K/uL 3.8  4.5    Hemoglobin 12.0 - 15.0 g/dL 14.7  14.6  16.7   Hematocrit 36.0 - 46.0 % 51.3  51.7  49.0   Platelets 150 - 400 K/uL 337  356         Latest Ref Rng & Units 12/06/2022    7:50 AM 12/05/2022   12:46 AM 12/04/2022    1:07 AM  CMP  Glucose 70 - 99 mg/dL 117  90  124   BUN 6 - 20 mg/dL '8  9  8   '$ Creatinine 0.44 -  1.00 mg/dL 0.73  0.60  0.72   Sodium 135 - 145 mmol/L 136  137  140   Potassium 3.5 - 5.1 mmol/L 3.9  3.5  3.9   Chloride 98 - 111 mmol/L 91  87  91   CO2 22 - 32 mmol/L 34  35  43   Calcium 8.9 - 10.3 mg/dL 9.2  8.8  8.6     Recent Labs    12/06/22 2116 12/07/22 0623 12/07/22 1114  GLUCAP 119* 113* 163*     Pertinent Imaging: No results found.  ASSESSMENT/PLAN:  Assessment: Principal Problem:   Acute systolic (congestive) heart failure (HCC) Active Problems:   Type 2 diabetes mellitus with hyperlipidemia (HCC)   Acute on chronic hypoxic respiratory failure (HCC)   Nonintractable episodic headache   AVITA Claudia [name] is a 57 y.o. with pertinent PMH of HTN, HLD, CAD, NSTEMI, pSVT, HFpEF 50-55%, obesity, poorly controlled T2DM, ACS, IDA, C5-C6 ACDF, OA, chronic oxygen use (2L) who presents to the ED for shortness of breath and was admitted for CHF exacerbation.    Plan: HFmrEF (EF 45%) Chronic oxygen use (2L) Hx of OSA HTN Presented w/ worsening exertional dyspnea. Likely in setting of HFpEF and untreated OSA. Respiratory status improving. Continue po Lasix today, kidney function normal. Currently on 3L [place], Cardiology signing off with  outpatient f/u. Patient will need to obtain home CPAP device prior to discharge.  -continue Lasix 80 mg po BID -continue Entresto 49-51 mg BID -continue spironolactone 25 mg daily  -continue dapagliflozin 10 mg daily  -continue amlodipine 5 mg daily  -strict I&Os -daily weights -CPAP QHS -PT recommends outpatient f/u   Dizziness Vision changes Hearing difficulty Presented with intermittent dizziness, floaters in her vision with flashes of color, uncontrollable twitching of upper and lower extremities and decreased hearing of both ears for several months.  CT head negative.  MRI brain negative but noted diffuse superficial signal abnormality either artifact versus chronic CSF leak.  Discussed with radiologist and he described superficial siderosis most commonly seen in IV iron infusions or recurrent clinical SAH. Not commonly seen with iron tablets. A diagnostic cerebral angiogram would best evaluate for recurrent SAH in outpatient setting.  -consider outpatient vestibular rehab  -consider outpatient cerebral angiogram for persistent symptoms    T2DM Obesity A1c 6.9%. Home meds include farxiga 10 mg daily, glipizide 10 mg BID, metformin 1000 mg BID, semaglutide 0.5 mg weekly. CBG has been within goal.  -CBG monitoring  -continue farxiga  -continue metformin -consider starting SSI if not controlled    Hx of NSTEMI CAD HLD Patient on ASA 81 mg daily. Noted to not tolerate statin therapy on allergy list. Last LDL 140.  -consider initiating Zetia at discharge    IDA 07/2022 iron panel showed normal iron level, elevated TIBC 557, and decreased saturation ratio. Ferritin low at 9. On ferrous sulfate 325 mg daily at home. Hemoglobin stable.   OA Hx of C5-C6 ACDF -tylenol PRN     Diet: Heart Healthy IVF: None,None VTE: Enoxaparin Code: Full PT/OT recs:  outpatient PT, no OT f/u   Dispo: Anticipated discharge to Home pending CPAP  Signature: Marisa Cyphers, Medical  Student   Please contact the on call pager after 5 pm and on weekends at [phone].   Attestation for Student Documentation:  I personally was present and performed or re-performed the history, physical exam and medical decision-making activities of this service and have verified that the service and findings are accurately documented in  the student's note.  Angelique Blonder, DO 12/07/2022, 2:45 PM

## 2022-12-07 NOTE — Plan of Care (Signed)

## 2022-12-07 NOTE — TOC Progression Note (Signed)
Transition of Care [facility]) - Progression Note    Patient Details  Name: SAHITI COLLAZO MRN: [id number] Date of Birth: [birth year]  Transition of Care [facility]) CM/SW Contact  Zenon Mayo, RN Phone Number: 12/07/2022, 1:07 PM  Clinical Narrative:    NCM spoke with patient at the bedside, she will need a cpap machine to go home with to use at night, she states she has oxygen with [employer] and would like to use them , because she knows they are in network with her insurance.  NCM made referral to [facility] with [employer], she is trying to see if she can get her set up with an apt today.  Awaiting call back.         Expected Discharge Plan and Services                                               Social Determinants of Health (SDOH) Interventions SDOH Screenings   Food Insecurity: No Food Insecurity (12/02/2022)  Housing: Low Risk  (12/02/2022)  Transportation Needs: No Transportation Needs (12/02/2022)  Utilities: Not At Risk (12/02/2022)  Alcohol Screen: Low Risk  (08/14/2022)  Depression (PHQ2-9): Low Risk  (02/03/2022)  Financial Resource Strain: Medium Risk (08/14/2022)  Tobacco Use: Low Risk  (12/02/2022)    Readmission Risk Interventions     No data to display

## 2022-12-07 NOTE — Progress Notes (Signed)
Mobility Specialist - Progress Note   12/07/22 1058  Mobility  Activity Ambulated with assistance in hallway  Level of Assistance Contact guard assist, steadying assist  Assistive Device None  Distance Ambulated (ft) 150 ft  Activity Response Tolerated well  Mobility Referral Yes  $Mobility charge 1 Mobility    Pt received in bed and agreeable. No complaints on walk, took standing break x1. Left sitting EOB w/ all needs met and call bell in reach.   [place] Specialist Please contact via SecureChat or Rehab office at [phone]

## 2022-12-08 ENCOUNTER — Telehealth: Payer: Self-pay

## 2022-12-08 NOTE — Transitions of Care (Post Inpatient/ED Visit) (Signed)
   12/08/2022  Name: Claudia [name] MRN: [id number] DOB: Jul 17, 1966  Today's TOC FU Call Status: Today's TOC FU Call Status:: Successful TOC FU Call Competed TOC FU Call Complete Date: 12/08/22  Transition Care Management Follow-up Telephone Call Date of Discharge: 12/07/22 Discharge Facility: [facility]) Type of Discharge: Inpatient Admission Primary Inpatient Discharge Diagnosis:: Acute systolic (congestive) heart failure How have you been since you were released from the hospital?: Better Any questions or concerns?: No  Items Reviewed: Did you receive and understand the discharge instructions provided?: Yes Medications obtained and verified?: Yes (Medications Reviewed) Any new allergies since your discharge?: No Dietary orders reviewed?: Yes Do you have support at home?: Yes  Home Care and Equipment/Supplies: [place] Ordered?: No Any new equipment or medical supplies ordered?: No  Functional Questionnaire: Do you need assistance with bathing/showering or dressing?: No Do you need assistance with meal preparation?: No Do you need assistance with eating?: No Do you have difficulty maintaining continence: No Do you need assistance with getting out of bed/getting out of a chair/moving?: No Do you have difficulty managing or taking your medications?: No  Folllow up appointments reviewed: PCP Follow-up appointment confirmed?: Yes Date of PCP follow-up appointment?: 12/11/22 Follow-up Provider: Lazaro Arms NP [facility] Follow-up appointment confirmed?: Yes Date of Specialist follow-up appointment?: 12/24/22 Follow-Up Specialty Provider:: Dr Haroldine Laws Do you need transportation to your follow-up appointment?: No Do you understand care options if your condition(s) worsen?: Yes-patient verbalized understanding    [name] [phone]

## 2022-12-11 ENCOUNTER — Ambulatory Visit: Payer: Self-pay | Admitting: Nurse Practitioner

## 2022-12-11 ENCOUNTER — Ambulatory Visit (INDEPENDENT_AMBULATORY_CARE_PROVIDER_SITE_OTHER): Payer: BLUE CROSS/BLUE SHIELD | Admitting: Nurse Practitioner

## 2022-12-11 ENCOUNTER — Encounter: Payer: Self-pay | Admitting: Nurse Practitioner

## 2022-12-11 VITALS — BP 115/78 | HR 86 | Temp 97.2°F | Ht 68.0 in | Wt 247.4 lb

## 2022-12-11 DIAGNOSIS — T466X5D Adverse effect of antihyperlipidemic and antiarteriosclerotic drugs, subsequent encounter: Secondary | ICD-10-CM

## 2022-12-11 DIAGNOSIS — E785 Hyperlipidemia, unspecified: Secondary | ICD-10-CM

## 2022-12-11 DIAGNOSIS — G720 Drug-induced myopathy: Secondary | ICD-10-CM | POA: Diagnosis not present

## 2022-12-11 DIAGNOSIS — E1169 Type 2 diabetes mellitus with other specified complication: Secondary | ICD-10-CM | POA: Diagnosis not present

## 2022-12-11 DIAGNOSIS — T466X5A Adverse effect of antihyperlipidemic and antiarteriosclerotic drugs, initial encounter: Secondary | ICD-10-CM

## 2022-12-11 DIAGNOSIS — Z1231 Encounter for screening mammogram for malignant neoplasm of breast: Secondary | ICD-10-CM | POA: Diagnosis not present

## 2022-12-11 MED ORDER — SEMAGLUTIDE(0.25 OR 0.5MG/DOS) 2 MG/3ML ~~LOC~~ SOPN: 0.5000 mg | PEN_INJECTOR | SUBCUTANEOUS | 0 refills | Status: DC

## 2022-12-11 NOTE — Patient Instructions (Addendum)
1. Type 2 diabetes mellitus with hyperlipidemia (HCC)  - Microalbumin/Creatinine Ratio, Urine - Ambulatory referral to Ophthalmology - Ambulatory referral to Podiatry - Semaglutide,0.25 or 0.5MG /DOS, 2 MG/3ML SOPN; Inject 0.5 mg into the skin once a week.  Dispense: 3 mL; Refill: 0   2. Encounter for screening mammogram for malignant neoplasm of breast  - MM Digital Screening  3. Adverse effect of antihyperlipidemic drug, subsequent encounter   4. Statin myopathy  -currently taking fish oil  -declines zetia at this time - advised to discuss with cardiology     Follow up:  Follow up in 3

## 2022-12-11 NOTE — Progress Notes (Unsigned)
@Patient  ID: Claudia [name], female    DOB: Jul 12, 1966, 57 y.o.   MRN: [id number]  Chief Complaint  Patient presents with   Hospitalization Follow-up    chf    Referring provider: Fenton Foy, NP   HPI  Patient presents today for hospital follow-up.  She was admitted to the hospital on 12/02/2022.  Overall is doing well since hospital discharge.  She does have follow-up visits with cardiology scheduled already.  She is still on oxygen at 2 L.  She was discharged with the following diagnoses:   CHF: Patient was discharged on Lasix 80 mg BID and has follow up with cardiology. TOC assisted with getting CPAP on discharge along with outpatient physical therapy. Scheduled to start April 3rd.  Diabetes: Hx of poorly controlled diabetes. A1c 9.3% in 07/2022. Taking farxiga 10 mg daily, glipizide 10 mg BID, metformin 1000 mg BID, and semaglutide 0.5 mg weekly at home. CBGs were mostly within goal. Glipizide was not restarted during hospitalization, appears plan was to wean off of glipizide per prior office notes.  Patient no longer taking glipizide.   Aortic atherosclerosis noted on CT On aspirin 81 mg daily. Has prior allergies to pravastatin, rosuvastatin, and atorvastatin. Lipid panel here WNL w/ exception of LDL 140. Could consider starting Zetia in outpatient setting, patient refuses today - will discuss with cardiology   IDA Iron panel from 3 months ago w/ normal iron, elevated TIBC at 557, and decreased saturation ratios. Ferritin was decreased at 9. On Ferrous sulfate 325 mg daily at home.   Denies f/c/s, n/v/d, hemoptysis, PND, leg swelling Denies chest pain or edema    Allergies  Allergen Reactions   Beef-Derived Products     Patient does not eat beef   Crestor [Rosuvastatin]     MUSCLE ACHES HEADACHES    Diltiazem Hives   Hydrocodone Itching   Other Hives, Swelling and Other (See Comments)    Vision changes; ALLERGIC TO ALL NUTS EXCEPT PEANUTS   Oxycodone Itching    Pork-Derived Products     For dietary, patient does not eat pork   Pravachol [Pravastatin Sodium] Itching    Joint pain   Simvastatin Hives    Immunization History  Administered Date(s) Administered   Pneumococcal Polysaccharide-23 03/01/2017   Tdap 03/01/2017    Past Medical History:  Diagnosis Date   Diabetes mellitus    Fibroids    Headache(784.0)    History of echocardiogram    Echo 7/18: Moderate concentric LVH, EF 55-60, normal wall motion, grade 1 diastolic dysfunction, calcified aortic leaflets   Hypercholesterolemia    Hyperlipidemia    Hypertension    Osteoarthritis    , with mild meniscus tear, followed by orthopedics--Dr Gladstone Lighter 04/29/10   Right knee pain    Seizures ([place])    as child   STEMI (ST elevation myocardial infarction) ([place])    Vitamin D deficiency     Tobacco History: Social History   Tobacco Use  Smoking Status Never  Smokeless Tobacco Never   Counseling given: Not Answered   Outpatient Encounter Medications as of 12/11/2022  Medication Sig   acetaminophen (TYLENOL) 500 MG tablet Take 500 mg by mouth every 6 (six) hours as needed for mild pain or moderate pain.   amLODipine (NORVASC) 5 MG tablet Take 1 tablet (5 mg total) by mouth daily.   aspirin 81 MG EC tablet Take 1 tablet (81 mg total) by mouth daily.   blood glucose meter kit and supplies KIT  Dispense based on patient and insurance preference. Use up to four times daily as directed. (FOR ICD-9 250.00, 250.01).   Blood Glucose Monitoring Suppl (ACCU-CHEK GUIDE) w/Device KIT 1 each by Does not apply route in the morning, at noon, in the evening, and at bedtime.   dapagliflozin propanediol (FARXIGA) 10 MG TABS tablet Take 1 tablet (10 mg total) by mouth daily.   ferrous sulfate 325 (65 FE) MG tablet Take 1 tablet (325 mg total) by mouth daily with breakfast.   furosemide (LASIX) 80 MG tablet Take 1 tablet (80 mg total) by mouth 2 (two) times daily.   glucose blood (ACCU-CHEK GUIDE) test  strip Use as instructed to check sugar 4 times daily.   Lancets (ACCU-CHEK MULTICLIX) lancets Use as instructed to check blood sugar 4 times daily.   metFORMIN (GLUCOPHAGE) 1000 MG tablet TAKE 1 TABLET BY MOUTH TWICE DAILY WITH A MEAL   Omega-3 Fatty Acids (FISH OIL) 1000 MG CAPS Take 1,000 mg by mouth daily.   OVER THE COUNTER MEDICATION Take 1 capsule by mouth daily. Herbal Supplement  -- unknown name   sacubitril-valsartan (ENTRESTO) 49-51 MG Take 1 tablet by mouth 2 (two) times daily.   spironolactone (ALDACTONE) 25 MG tablet Take 1 tablet (25 mg total) by mouth daily.   [DISCONTINUED] Semaglutide,0.25 or 0.5MG /DOS, 2 MG/3ML SOPN Inject 0.5 mg into the skin once a week.   Semaglutide,0.25 or 0.5MG /DOS, 2 MG/3ML SOPN Inject 0.5 mg into the skin once a week.   No facility-administered encounter medications on file as of 12/11/2022.     Review of Systems  Review of Systems  Constitutional: Negative.   HENT: Negative.    Cardiovascular: Negative.   Gastrointestinal: Negative.   Allergic/Immunologic: Negative.   Neurological: Negative.   Psychiatric/Behavioral: Negative.         Physical Exam  BP 115/78   Pulse 86   Temp (!) 97.2 F (36.2 C)   Ht 5\' 8"  (1.727 m)   Wt 247 lb 6.4 oz (112.2 kg)   LMP 05/09/2012   SpO2 98%   BMI 37.62 kg/m   Wt Readings from Last 5 Encounters:  12/11/22 247 lb 6.4 oz (112.2 kg)  12/07/22 247 lb 14.4 oz (112.4 kg)  11/05/22 265 lb (120.2 kg)  10/23/22 271 lb (122.9 kg)  09/24/22 267 lb (121.1 kg)     Physical Exam Vitals and nursing note reviewed.  Constitutional:      General: She is not in acute distress.    Appearance: She is well-developed.  Cardiovascular:     Rate and Rhythm: Normal rate and regular rhythm.  Pulmonary:     Effort: Pulmonary effort is normal.     Breath sounds: Normal breath sounds.  Neurological:     Mental Status: She is alert and oriented to person, place, and time.      Lab Results:  CBC     Component Value Date/Time   WBC 3.8 (L) 12/05/2022 0046   RBC 6.96 (H) 12/05/2022 0046   HGB 14.7 12/05/2022 0046   HGB 13.2 09/02/2022 1402   HCT 51.3 (H) 12/05/2022 0046   HCT 45.5 09/02/2022 1402   PLT 337 12/05/2022 0046   PLT 367 09/02/2022 1402   MCV 73.7 (L) 12/05/2022 0046   MCV 71 (L) 09/02/2022 1402   MCH 21.1 (L) 12/05/2022 0046   MCHC 28.7 (L) 12/05/2022 0046   RDW 21.1 (H) 12/05/2022 0046   RDW 17.3 (H) 09/02/2022 1402   LYMPHSABS 1.9 12/02/2022 1924  LYMPHSABS 2.8 02/03/2022 1425   MONOABS 0.9 12/02/2022 1924   EOSABS 0.1 12/02/2022 1924   EOSABS 0.2 02/03/2022 1425   BASOSABS 0.0 12/02/2022 1924   BASOSABS 0.0 02/03/2022 1425    BMET    Component Value Date/Time   NA 136 12/06/2022 0750   NA 140 09/02/2022 1402   K 3.9 12/06/2022 0750   CL 91 (L) 12/06/2022 0750   CO2 34 (H) 12/06/2022 0750   GLUCOSE 117 (H) 12/06/2022 0750   BUN 8 12/06/2022 0750   BUN 6 09/02/2022 1402   CREATININE 0.73 12/06/2022 0750   CREATININE 0.74 07/07/2017 1557   CALCIUM 9.2 12/06/2022 0750   GFRNONAA >60 12/06/2022 0750   GFRNONAA 95 07/07/2017 1557   GFRAA 86 10/04/2020 1607   GFRAA 110 07/07/2017 1557    BNP    Component Value Date/Time   BNP 386.6 (H) 12/02/2022 0707   BNP 6.2 02/25/2017 1536    ProBNP    Component Value Date/Time   PROBNP 12 04/12/2017 0839   PROBNP 420.2 (H) 06/01/2012 1648    Imaging: ECHOCARDIOGRAM COMPLETE  Result Date: 12/04/2022    ECHOCARDIOGRAM REPORT   Patient Name:   RALNA BOEDIGHEIMER Date of Exam: 12/04/2022 Medical Rec #:  [id number]      Height:       68.0 in Accession #:    [id number]     Weight:       251.0 lb Date of Birth:  [birth year]     BSA:          2.251 m Patient Age:    45 years       BP:           148/84 mmHg Patient Gender: F              HR:           81 bpm. Exam Location:  Inpatient Procedure: 2D Echo, Cardiac Doppler and Color Doppler Indications:    CHF-Acute Diastolic [id number]  History:        Patient has prior  history of Echocardiogram examinations, most                 recent 08/13/2022. CHF, Previous Myocardial Infarction,                 Arrythmias:Tachycardia, Signs/Symptoms:Dyspnea; Risk                 Factors:Hypertension, Diabetes and Dyslipidemia.  Sonographer:    Ronny Flurry Referring Phys: [id number] GRACE LAU  Sonographer Comments: Patient is obese. Image acquisition challenging due to respiratory motion. IMPRESSIONS  1. Left ventricular ejection fraction, by estimation, is 45%. The left ventricle has mildly decreased function. The left ventricle demonstrates regional wall motion abnormalities with septal hypokinesis. There is mild concentric left ventricular hypertrophy. Left ventricular diastolic parameters are consistent with Grade I diastolic dysfunction (impaired relaxation).  2. Mildly D-shaped interventricular septum suggestive of RV pressure/volume overload. Right ventricular systolic function is moderately reduced. The right ventricular size is mildly enlarged. Tricuspid regurgitation signal is inadequate for assessing PA  pressure.  3. The mitral valve is normal in structure. Trivial mitral valve regurgitation. No evidence of mitral stenosis.  4. The aortic valve is tricuspid. Aortic valve regurgitation is not visualized. No aortic stenosis is present.  5. The inferior vena cava is normal in size with greater than 50% respiratory variability, suggesting right atrial pressure of 3 mmHg. FINDINGS  Left Ventricle: Left ventricular ejection fraction, by  estimation, is 45%. The left ventricle has mildly decreased function. The left ventricle demonstrates regional wall motion abnormalities. The left ventricular internal cavity size was normal in size. There is mild concentric left ventricular hypertrophy. Left ventricular diastolic parameters are consistent with Grade I diastolic dysfunction (impaired relaxation). Right Ventricle: Mildly D-shaped interventricular septum suggestive of RV pressure/volume  overload. The right ventricular size is mildly enlarged. No increase in right ventricular wall thickness. Right ventricular systolic function is moderately reduced.  Tricuspid regurgitation signal is inadequate for assessing PA pressure. Left Atrium: Left atrial size was normal in size. Right Atrium: Right atrial size was normal in size. Pericardium: There is no evidence of pericardial effusion. The pericardial effusion is circumferential. Mitral Valve: The mitral valve is normal in structure. Trivial mitral valve regurgitation. No evidence of mitral valve stenosis. Tricuspid Valve: The tricuspid valve is normal in structure. Tricuspid valve regurgitation is not demonstrated. Aortic Valve: The aortic valve is tricuspid. Aortic valve regurgitation is not visualized. No aortic stenosis is present. Aortic valve mean gradient measures 5.0 mmHg. Aortic valve peak gradient measures 8.4 mmHg. Aortic valve area, by VTI measures 3.21 cm. Pulmonic Valve: The pulmonic valve was normal in structure. Pulmonic valve regurgitation is trivial. Aorta: The aortic root is normal in size and structure. Venous: The inferior vena cava is normal in size with greater than 50% respiratory variability, suggesting right atrial pressure of 3 mmHg. IAS/Shunts: No atrial level shunt detected by color flow Doppler.  LEFT VENTRICLE PLAX 2D LVIDd:         4.90 cm   Diastology LVIDs:         3.70 cm   LV e' medial:    4.68 cm/s LV PW:         1.30 cm   LV E/e' medial:  11.4 LV IVS:        1.10 cm   LV e' lateral:   4.73 cm/s LVOT diam:     2.40 cm   LV E/e' lateral: 11.3 LV SV:         84 LV SV Index:   37 LVOT Area:     4.52 cm  RIGHT VENTRICLE             IVC RV S prime:     11.50 cm/s  IVC diam: 2.00 cm TAPSE (M-mode): 2.4 cm LEFT ATRIUM             Index        RIGHT ATRIUM           Index LA diam:        4.00 cm 1.78 cm/m   RA Area:     16.60 cm LA Vol (A2C):   57.0 ml 25.32 ml/m  RA Volume:   41.90 ml  18.62 ml/m LA Vol (A4C):   78.8 ml  35.01 ml/m LA Biplane Vol: 59.9 ml 26.61 ml/m  AORTIC VALVE AV Area (Vmax):    3.20 cm AV Area (Vmean):   3.12 cm AV Area (VTI):     3.21 cm AV Vmax:           145.00 cm/s AV Vmean:          98.400 cm/s AV VTI:            0.262 m AV Peak Grad:      8.4 mmHg AV Mean Grad:      5.0 mmHg LVOT Vmax:         102.63 cm/s LVOT  Vmean:        67.767 cm/s LVOT VTI:          0.186 m LVOT/AV VTI ratio: 0.71  AORTA Ao Root diam: 3.40 cm Ao Asc diam:  3.30 cm MITRAL VALVE MV Area (PHT): 3.77 cm    SHUNTS MV Decel Time: 201 msec    Systemic VTI:  0.19 m MV E velocity: 53.40 cm/s  Systemic Diam: 2.40 cm MV A velocity: 76.20 cm/s MV E/A ratio:  0.70 Dalton McleanMD Electronically signed by Franki Monte Signature Date/Time: 12/04/2022/1:25:56 PM    Final    CT Angio Chest Pulmonary Embolism (PE) W or WO Contrast  Result Date: 12/03/2022 CLINICAL DATA:  Shortness of breath; dizziness and headache EXAM: CT ANGIOGRAPHY CHEST WITH CONTRAST TECHNIQUE: Multidetector CT imaging of the chest was performed using the standard protocol during bolus administration of intravenous contrast. Multiplanar CT image reconstructions and MIPs were obtained to evaluate the vascular anatomy. RADIATION DOSE REDUCTION: This exam was performed according to the departmental dose-optimization program which includes automated exposure control, adjustment of the mA and/or kV according to patient size and/or use of iterative reconstruction technique. CONTRAST:  9mL OMNIPAQUE IOHEXOL 350 MG/ML SOLN COMPARISON:  Chest CT May 22, 2022 FINDINGS: Cardiovascular: No evidence of pulmonary embolus. Evaluation of the subsegmental pulmonary arteries is somewhat limited due to streak artifact. Cardiomegaly. New small pericardial effusion. Normal caliber thoracic aorta with mild atherosclerotic disease. Dilated main pulmonary artery, measuring up to 3.6 mm. Mediastinum/Nodes: Esophagus and thyroid are unremarkable. Mildly enlarged right hilar lymph nodes.  Reference node measuring 1.4 cm on series 7, image 162, unchanged when compared with the prior exam and likely reactive. Lungs/Pleura: Central airways are patent. Mild mosaic attenuation. Interval resolution of right upper lobe consolidation. Mild bibasilar opacities, likely due to atelectasis. No pleural effusion or pneumothorax. Upper Abdomen: No acute abnormality. Musculoskeletal: No chest wall abnormality. No acute or significant osseous findings. Review of the MIP images confirms the above findings. IMPRESSION: 1. No evidence of pulmonary embolus. 2. Cardiomegaly with new small pericardial effusion. 3. Mild bibasilar opacities which are likely due to atelectasis. 4. Mild mosaic attenuation, findings can be seen in the setting of small airways disease or mosaic perfusion secondary to pulmonary hypertension. 5. Dilated main pulmonary artery, findings can be seen in the setting of pulmonary hypertension. 6. Aortic Atherosclerosis (ICD10-I70.0). Electronically Signed   By: Yetta Glassman M.D.   On: 12/03/2022 11:52   CT Head Wo Contrast  Result Date: 12/02/2022 CLINICAL DATA:  Headache and dizziness EXAM: CT HEAD WITHOUT CONTRAST TECHNIQUE: Contiguous axial images were obtained from the base of the skull through the vertex without intravenous contrast. RADIATION DOSE REDUCTION: This exam was performed according to the departmental dose-optimization program which includes automated exposure control, adjustment of the mA and/or kV according to patient size and/or use of iterative reconstruction technique. COMPARISON:  Same day brain MRI FINDINGS: Brain: No evidence of acute infarction, hemorrhage, hydrocephalus, extra-axial collection or mass lesion/mass effect. Enlarged and partially empty sella. Vascular: No hyperdense vessel or unexpected calcification. Skull: Normal. Negative for fracture or focal lesion. Sinuses/Orbits: No middle ear or mastoid effusion. Paranasal sinuses are notable for polypoid mucosal  thickening of the floor of the left maxillary sinus. Orbits are unremarkable. Other: None IMPRESSION: No acute intracranial abnormality. Electronically Signed   By: Marin Roberts M.D.   On: 12/02/2022 13:15   DG Chest 2 View  Result Date: 12/02/2022 CLINICAL DATA:  Shortness of breath EXAM: CHEST - 2  VIEW COMPARISON:  Chest radiograph 08/15/2022. FINDINGS: The heart is significantly enlarged, unchanged. The upper mediastinal contours are normal. There is vascular congestion and possible mild pulmonary interstitial edema. There is no focal consolidation. There is no pleural effusion or pneumothorax There is no acute osseous abnormality. IMPRESSION: Cardiomegaly with vascular congestion and possible mild pulmonary interstitial edema. Electronically Signed   By: Valetta Mole M.D.   On: 12/02/2022 12:34   MR BRAIN WO CONTRAST  Result Date: 12/02/2022 CLINICAL DATA:  Headache, neuro deficit. Headache and dizziness since November. EXAM: MRI HEAD WITHOUT CONTRAST TECHNIQUE: Multiplanar, multiecho pulse sequences of the brain and surrounding structures were obtained without intravenous contrast. COMPARISON:  Head CT 05/01/2013. FINDINGS: Brain: No acute infarct or hemorrhage. No mass or midline shift. No hydrocephalus or extra-axial collection. Questionable diffuse superficial signal abnormality on susceptibility weighted imaging is favored to represent artifact given uniformity and lack of correlation on other sequences. Vascular: Normal flow voids. Skull and upper cervical spine: Normal marrow signal. Sinuses/Orbits: Mild mucosal disease in the left sphenoid sinus. Orbits are unremarkable. Other: None. IMPRESSION: 1. No acute intracranial abnormality or mass. 2. Diffuse superficial signal abnormality on susceptibility weighted imaging is favored to represent artifact given uniformity and lack of correlation on other sequences. Iron infusions may also contribute to this appearance. If real, superficial siderosis can  be seen in the setting of chronic spinal CSF leak. Correlate for history of orthostatic headaches. Consider repeat MRI of the brain without and with contrast on a different scanner. Electronically Signed   By: Emmit Alexanders M.D.   On: 12/02/2022 09:30     Assessment & Plan:   Type 2 diabetes mellitus with hyperlipidemia (HCC) - Microalbumin/Creatinine Ratio, Urine - Ambulatory referral to Ophthalmology - Ambulatory referral to Podiatry - Semaglutide,0.25 or 0.5MG /DOS, 2 MG/3ML SOPN; Inject 0.5 mg into the skin once a week.  Dispense: 3 mL; Refill: 0   2. Encounter for screening mammogram for malignant neoplasm of breast  - MM Digital Screening  3. Adverse effect of antihyperlipidemic drug, subsequent encounter   4. Statin myopathy  -currently taking fish oil  -declines zetia at this time - advised to discuss with cardiology     Follow up:  Follow up in [place], NP 12/14/2022

## 2022-12-14 ENCOUNTER — Encounter: Payer: Self-pay | Admitting: Nurse Practitioner

## 2022-12-14 ENCOUNTER — Telehealth: Payer: Self-pay

## 2022-12-14 LAB — MICROALBUMIN / CREATININE URINE RATIO
Creatinine, Urine: 62.9 mg/dL
Microalb/Creat Ratio: <5 mg/g creat (ref 0–29)
Microalbumin, Urine: <3 ug/mL

## 2022-12-14 NOTE — Assessment & Plan Note (Signed)
-   Microalbumin/Creatinine Ratio, Urine - Ambulatory referral to Ophthalmology - Ambulatory referral to Podiatry - Semaglutide,0.25 or 0.5MG /DOS, 2 MG/3ML SOPN; Inject 0.5 mg into the skin once a week.  Dispense: 3 mL; Refill: 0   2. Encounter for screening mammogram for malignant neoplasm of breast  - MM Digital Screening  3. Adverse effect of antihyperlipidemic drug, subsequent encounter   4. Statin myopathy  -currently taking fish oil  -declines zetia at this time - advised to discuss with cardiology     Follow up:  Follow up in 3

## 2022-12-14 NOTE — Telephone Encounter (Signed)
Pt was called to advise of letter completions. [place]

## 2022-12-16 DIAGNOSIS — I10 Essential (primary) hypertension: Secondary | ICD-10-CM | POA: Diagnosis not present

## 2022-12-16 DIAGNOSIS — G4733 Obstructive sleep apnea (adult) (pediatric): Secondary | ICD-10-CM | POA: Diagnosis not present

## 2022-12-21 ENCOUNTER — Telehealth: Payer: Self-pay | Admitting: Pharmacist

## 2022-12-21 MED ORDER — OZEMPIC (1 MG/DOSE) 4 MG/3ML ~~LOC~~ SOPN: 1.0000 mg | PEN_INJECTOR | SUBCUTANEOUS | 0 refills | Status: DC

## 2022-12-21 NOTE — Telephone Encounter (Signed)
Pt left voicemail returning my call. I called pt back and left another message.

## 2022-12-21 NOTE — Telephone Encounter (Signed)
Spoke with pt. Tolerating Ozempic 0.5mg  well, has a few doses left. Off her glipizide, still taking metformin. Will send in rx for 1mg  dose for when her next refill is due, and will call her in another 6 weeks for tolerability update. Advised her to call clinic if she notices low glucose readings on higher Ozempic dose, would decrease her metformin if that happens.

## 2022-12-21 NOTE — Telephone Encounter (Signed)
Called pt to f/u with tolerability of Ozempic 0.5mg  weekly and left message. If tolerating well, will increase to 1mg  weekly. I had decreased her glipizide from 10mg  BID to 5mg  BID, med has since been removed from her med list on 12/07/22 at hospital discharge for headaches - they had read prior note of planning to wean her off this med. Will get an update on her home glucose readings.

## 2022-12-23 ENCOUNTER — Other Ambulatory Visit: Payer: Self-pay

## 2022-12-23 MED ORDER — DAPAGLIFLOZIN PROPANEDIOL 10 MG PO TABS: 10.0000 mg | ORAL_TABLET | Freq: Every day | ORAL | 2 refills | Status: DC

## 2022-12-24 ENCOUNTER — Other Ambulatory Visit (HOSPITAL_COMMUNITY): Payer: Self-pay | Admitting: Internal Medicine

## 2022-12-24 ENCOUNTER — Encounter (HOSPITAL_COMMUNITY): Payer: Self-pay | Admitting: Internal Medicine

## 2022-12-24 ENCOUNTER — Ambulatory Visit (HOSPITAL_COMMUNITY)
Admission: RE | Admit: 2022-12-24 | Discharge: 2022-12-24 | Disposition: A | Discharge status: Home / Self Care | Payer: BLUE CROSS/BLUE SHIELD | Source: Ambulatory Visit | Attending: Internal Medicine | Admitting: Internal Medicine

## 2022-12-24 ENCOUNTER — Ambulatory Visit (HOSPITAL_COMMUNITY)
Admission: RE | Admit: 2022-12-24 | Discharge: 2022-12-24 | Disposition: A | Discharge status: Home / Self Care | Payer: Medicaid Other | Source: Ambulatory Visit | Attending: Internal Medicine | Admitting: Internal Medicine

## 2022-12-24 VITALS — BP 134/90 | HR 94 | Wt 244.0 lb

## 2022-12-24 DIAGNOSIS — I472 Ventricular tachycardia, unspecified: Secondary | ICD-10-CM | POA: Insufficient documentation

## 2022-12-24 DIAGNOSIS — I110 Hypertensive heart disease with heart failure: Secondary | ICD-10-CM | POA: Diagnosis not present

## 2022-12-24 DIAGNOSIS — Z79899 Other long term (current) drug therapy: Secondary | ICD-10-CM | POA: Insufficient documentation

## 2022-12-24 DIAGNOSIS — I5043 Acute on chronic combined systolic (congestive) and diastolic (congestive) heart failure: Secondary | ICD-10-CM | POA: Insufficient documentation

## 2022-12-24 DIAGNOSIS — G4733 Obstructive sleep apnea (adult) (pediatric): Secondary | ICD-10-CM

## 2022-12-24 DIAGNOSIS — R0602 Shortness of breath: Secondary | ICD-10-CM

## 2022-12-24 DIAGNOSIS — J9611 Chronic respiratory failure with hypoxia: Secondary | ICD-10-CM | POA: Insufficient documentation

## 2022-12-24 DIAGNOSIS — I10 Essential (primary) hypertension: Secondary | ICD-10-CM | POA: Diagnosis not present

## 2022-12-24 DIAGNOSIS — I272 Pulmonary hypertension, unspecified: Secondary | ICD-10-CM

## 2022-12-24 DIAGNOSIS — I493 Ventricular premature depolarization: Secondary | ICD-10-CM

## 2022-12-24 DIAGNOSIS — I50810 Right heart failure, unspecified: Secondary | ICD-10-CM

## 2022-12-24 DIAGNOSIS — Z6837 Body mass index (BMI) 37.0-37.9, adult: Secondary | ICD-10-CM | POA: Insufficient documentation

## 2022-12-24 DIAGNOSIS — Z6841 Body Mass Index (BMI) 40.0 and over, adult: Secondary | ICD-10-CM

## 2022-12-24 DIAGNOSIS — E119 Type 2 diabetes mellitus without complications: Secondary | ICD-10-CM | POA: Diagnosis not present

## 2022-12-24 DIAGNOSIS — I5022 Chronic systolic (congestive) heart failure: Secondary | ICD-10-CM

## 2022-12-24 LAB — BASIC METABOLIC PANEL
Anion gap: 12 (ref 5–15)
BUN: 9 mg/dL (ref 6–20)
CO2: 32 mmol/L (ref 22–32)
Calcium: 9.4 mg/dL (ref 8.9–10.3)
Chloride: 93 mmol/L — ABNORMAL LOW (ref 98–111)
Creatinine, Ser: 0.75 mg/dL (ref 0.44–1.00)
GFR, Estimated: >60 mL/min (ref >60)
Glucose, Bld: 173 mg/dL — ABNORMAL HIGH (ref 70–99)
Potassium: 3.4 mmol/L — ABNORMAL LOW (ref 3.5–5.1)
Sodium: 137 mmol/L (ref 135–145)

## 2022-12-24 LAB — BRAIN NATRIURETIC PEPTIDE: B Natriuretic Peptide: 13.7 pg/mL (ref 0.0–100.0)

## 2022-12-24 MED ORDER — ENTRESTO 97-103 MG PO TABS: 1.0000 | ORAL_TABLET | Freq: Two times a day (BID) | ORAL | 7 refills | Status: DC

## 2022-12-24 MED ORDER — ENTRESTO 49-51 MG PO TABS: 1.0000 | ORAL_TABLET | Freq: Two times a day (BID) | ORAL | 7 refills | Status: DC

## 2022-12-24 NOTE — Progress Notes (Signed)
Zio patch placed onto patient.  All instructions and information reviewed with patient, they verbalize understanding with no questions. 

## 2022-12-24 NOTE — Patient Instructions (Addendum)
Good to see you today!  INCREASE Entresto to 97/103 mg Twice daily  Labs done today, your results will be available in MyChart, we will contact you for abnormal readings.  You have been referred to Pulmonary Rehab they will call you to schedule an appointment  Your provider has recommended that  you wear a Zio Patch for 14 days.  This monitor will record your heart rhythm for our review.  IF you have any symptoms while wearing the monitor please press the button.  If you have any issues with the patch or you notice a red or orange light on it please call the company at [phone].  Once you remove the patch please mail it back to the company as soon as possible so we can get the results.   Your physician recommends that you schedule a follow-up appointment in: 4-6 weeks with app clinic     If you have any questions or concerns before your next appointment please send [place] a message through [place] or call our office at [phone].    TO LEAVE A MESSAGE FOR THE NURSE SELECT OPTION 2, PLEASE LEAVE A MESSAGE INCLUDING: YOUR NAME DATE OF BIRTH CALL BACK NUMBER REASON FOR CALL**this is important as we prioritize the call backs  YOU WILL RECEIVE A CALL BACK THE SAME DAY AS LONG AS YOU CALL BEFORE 4:00 PM  At the [facility], you and your health needs are our priority. As part of our continuing mission to provide you with exceptional heart care, we have created designated Provider Care Teams. These Care Teams include your primary Cardiologist (physician) and Advanced Practice Providers (APPs- Physician Assistants and Nurse Practitioners) who all work together to provide you with the care you need, when you need it.   You may see any of the following providers on your designated Care Team at your next follow up: Dr Glori Bickers Dr Loralie Champagne Dr. Roxana Hires, NP Lyda Jester, [place] [facility] [name], [place] Forestine Na, NP Audry Riles, PharmD   Please be sure to bring in all your medications bottles to every appointment.    Thank you for choosing [facility]

## 2022-12-24 NOTE — Addendum Note (Signed)
Encounter addended by: Rockwell Alexandria, CMA on: 12/24/2022 2:18 PM  Actions taken: Clinical Note Signed

## 2022-12-24 NOTE — Addendum Note (Signed)
Encounter addended by: Stanford Scotland, RN on: 12/24/2022 10:04 AM  Actions taken: Clinical Note Signed, Order list changed

## 2022-12-24 NOTE — Progress Notes (Addendum)
Advanced Heart Failure    Primary Care:Lazaro Arms NP  Primary Cardiologist: Dr Marlou Porch  HF MD: Dr Vaughan Browner  HPI: Ms Agate is a 57 year old with morbid obesity, HFpEF, NSVT, DMII, IDA, and HTN.   Cath 2019 for CP with normal cors   Admitted 11/23 with A/C HFpEF. Discharge weight 278 pounds.   She was seen HF TOC 08/26/22 and set up for RHC. (See below)  Sleep Study - Severe OSA. AHI 60 and nocturnal hypoxia.   [place] 09/16/22 - Mild mixed pulmonary with mildly elevated left-sided filling pressures.  Admitted 3/6-3/11/24 with ADHF. Diuresed and lasix increased to 80 bid. D/c weight 247  Echo 45% with septal HK D shaped ventricle  Today she returns for HF follow up. Says she SOB and fatigued quickly with walking. Has to take her time. Lasix recently increased to 80 bid. Weight is down. No swelling. Now using Bipap.  SBP 130s    Cardiac Testing  RHC 09/16/22  RA = 2 RV = 46/5 PA = 48/30 (37) PCW = 20 Fick cardiac output/index = 7.4/3.2 Thermo CO/CI = 8.7/3.8 PVR = 2.3 WU Ao sat = 93% PA sat = 70%, 71% 1. Mild mixed pulmonary with mildly elevated left-sided filling pressures Continue medical therapy and weight loss efforts. No role for selective pulmonary vasodilators.     Past Medical History:  Diagnosis Date   Diabetes mellitus    Fibroids    Headache(784.0)    History of echocardiogram    Echo 7/18: Moderate concentric LVH, EF 55-60, normal wall motion, grade 1 diastolic dysfunction, calcified aortic leaflets   Hypercholesterolemia    Hyperlipidemia    Hypertension    Osteoarthritis    , with mild meniscus tear, followed by orthopedics--Dr Gladstone Lighter 04/29/10   Right knee pain    Seizures ([place])    as child   STEMI (ST elevation myocardial infarction) ([place])    Vitamin D deficiency     Current Outpatient Medications  Medication Sig Dispense Refill   acetaminophen (TYLENOL) 500 MG tablet Take 500 mg by mouth every 6 (six) hours as needed for mild pain or  moderate pain.     amLODipine (NORVASC) 5 MG tablet Take 1 tablet (5 mg total) by mouth daily. 180 tablet 0   aspirin 81 MG EC tablet Take 1 tablet (81 mg total) by mouth daily. 30 tablet 6   blood glucose meter kit and supplies KIT Dispense based on patient and insurance preference. Use up to four times daily as directed. (FOR ICD-9 250.00, 250.01). 1 each 0   Blood Glucose Monitoring Suppl (ACCU-CHEK GUIDE) w/Device KIT 1 each by Does not apply route in the morning, at noon, in the evening, and at bedtime. 1 kit 0   dapagliflozin propanediol (FARXIGA) 10 MG TABS tablet Take 1 tablet (10 mg total) by mouth daily. 30 tablet 2   ferrous sulfate 325 (65 FE) MG tablet Take 1 tablet (325 mg total) by mouth daily with breakfast. 60 tablet 6   furosemide (LASIX) 80 MG tablet Take 1 tablet (80 mg total) by mouth 2 (two) times daily. 60 tablet 0   glucose blood (ACCU-CHEK GUIDE) test strip Use as instructed to check sugar 4 times daily. 200 each 6   Lancets (ACCU-CHEK MULTICLIX) lancets Use as instructed to check blood sugar 4 times daily. 204 each 12   metFORMIN (GLUCOPHAGE) 1000 MG tablet TAKE 1 TABLET BY MOUTH TWICE DAILY WITH A MEAL 180 tablet 1  Omega-3 Fatty Acids (FISH OIL) 1000 MG CAPS Take 1,000 mg by mouth daily.     OVER THE COUNTER MEDICATION Take 1 capsule by mouth daily. Herbal Supplement  -- unknown name     sacubitril-valsartan (ENTRESTO) 49-51 MG Take 1 tablet by mouth 2 (two) times daily. 60 tablet 6   Semaglutide, 1 MG/DOSE, (OZEMPIC, 1 MG/DOSE,) 4 MG/3ML SOPN Inject 1 mg into the skin once a week. 3 mL 0   spironolactone (ALDACTONE) 25 MG tablet Take 1 tablet (25 mg total) by mouth daily. 90 tablet 3   No current facility-administered medications for this encounter.    Allergies  Allergen Reactions   Beef-Derived Products     Patient does not eat beef   Crestor [Rosuvastatin]     MUSCLE ACHES HEADACHES    Diltiazem Hives   Hydrocodone Itching   Other Hives, Swelling and  Other (See Comments)    Vision changes; ALLERGIC TO ALL NUTS EXCEPT PEANUTS   Oxycodone Itching   Pork-Derived Products     For dietary, patient does not eat pork   Pravachol [Pravastatin Sodium] Itching    Joint pain   Simvastatin Hives      Social History   Socioeconomic History   Marital status: Married    Spouse name: Doren Custard   Number of children: 6   Years of education: Not on file   Highest education level: High school graduate  Occupational History   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   Smoking status: Never   Smokeless tobacco: Never  Vaping Use   Vaping Use: Never used  Substance and Sexual Activity   Alcohol use: No   Drug use: No   Sexual activity: Yes  Other Topics Concern   Not on file  Social History Narrative   Not on file   Social Determinants of Health   Financial Resource Strain: Medium Risk (08/14/2022)   Overall Financial Resource Strain (CARDIA)    Difficulty of Paying Living Expenses: Somewhat hard  Food Insecurity: No Food Insecurity (12/02/2022)   Hunger Vital Sign    Worried About Running Out of Food in the Last Year: Never true    Ran Out of Food in the Last Year: Never true  Transportation Needs: No Transportation Needs (12/02/2022)   PRAPARE - Hydrologist (Medical): No    Lack of Transportation (Non-Medical): No  Physical Activity: Not on file  Stress: Not on file  Social Connections: Not on file  Intimate Partner Violence: Not At Risk (12/02/2022)   Humiliation, Afraid, Rape, and Kick questionnaire    Fear of Current or Ex-Partner: No    Emotionally Abused: No    Physically Abused: No    Sexually Abused: No      Family History  Problem Relation Age of Onset   Hypertension Mother    Diabetes Mother    Hypercholesterolemia Mother    Hypertension Sister    CVA Maternal Grandmother    Hypertension Maternal Grandmother    Diabetes Maternal Grandmother    Heart attack Neg Hx      Vitals:   12/24/22 0854  BP: (!) 134/90  Pulse: 94  SpO2: 98%  Weight: 110.7 kg (244 lb)    Wt Readings from Last 3 Encounters:  12/24/22 110.7 kg (244 lb)  12/11/22 112.2 kg (247 lb 6.4 oz)  12/07/22 112.4 kg (247 lb 14.4 oz)    PHYSICAL EXAM: General:  Obese woman wearing O2 HEENT: normal  Neck: supple. no JVD. Carotids 2+ bilat; no bruits. No lymphadenopathy or thryomegaly appreciated. Cor: PMI nondisplaced. Regular rate & rhythm. No rubs, gallops or murmurs. Lungs: clear Abdomen: soft, nontender, nondistended. No hepatosplenomegaly. No bruits or masses. Good bowel sounds. Extremities: no cyanosis, clubbing, rash, edema Neuro: alert & orientedx3, cranial nerves grossly intact. moves all 4 extremities w/o difficulty. Affect pleasant   ASSESSMENT & PLAN:  1. Chronic HFmrEF and RV moderately reduced - Echo 2019 EF 55-60% with normal RV.  - Echo 07/2022 EF 55-60% & RV moderately reduced.  - Echo 3/24: EF 45% with septal HK D-shape septum - LHC 2019 normal cors - RHC 12/23 RA 2 PA 48/30 (37) PCW 20 Fick 7.4/3.2 Thermo 8.7/3.8 PVR 2.3 WU - Chronic NYHA III - Volume status improved.  - Continue lasix 80 bid. Watch for overdiuresis - Increase Entresto 97/103 bid - Continue spiro 25 daily  - Continue Farxiga 10 - Has not been on b-blocker with RV failure - Check labs  2. PAH, mild (post-capillary) - Evidence of RV strain on echo - Suspect WHO GROUP 2 (HF) and 3 (OSA/OHS) - VQ scan April 2023 which was negative. - Continue BIPAP - Continue weight loss - Refer Pulmonary rehab   3. Chronic hypoxic respiratory failure - Mostly due to OHS - Continue O2, bipap and weight loss - Refer Pulmonary Rehab  4. HTN - Improving. Increase Entresto  5. Morbid Obesity  - Body mass index is 37.1 kg/m. - Continue Ozempic  6. DMII -08/12/22 Hgb A1C 9.3 - On Farxiga 10 mg daily.  -Needs follow up with PCP.  7. Sleep Apnea, severe - AHI 60 and nocturnal hypoxia.  -  Continue BIPAP and weight loss efforts  8. PVCs, frequent - Zio to quantify    Glori Bickers MD 9:12 AM

## 2022-12-25 ENCOUNTER — Other Ambulatory Visit (HOSPITAL_COMMUNITY): Payer: Self-pay

## 2022-12-28 ENCOUNTER — Ambulatory Visit
Admission: RE | Admit: 2022-12-28 | Discharge: 2022-12-28 | Disposition: A | Discharge status: Home / Self Care | Payer: BLUE CROSS/BLUE SHIELD | Source: Ambulatory Visit | Attending: Nurse Practitioner | Admitting: Nurse Practitioner

## 2022-12-28 DIAGNOSIS — I10 Essential (primary) hypertension: Secondary | ICD-10-CM | POA: Diagnosis not present

## 2022-12-28 DIAGNOSIS — G4733 Obstructive sleep apnea (adult) (pediatric): Secondary | ICD-10-CM | POA: Diagnosis not present

## 2022-12-30 ENCOUNTER — Ambulatory Visit: Payer: BLUE CROSS/BLUE SHIELD

## 2023-01-04 ENCOUNTER — Telehealth (HOSPITAL_COMMUNITY): Payer: Self-pay

## 2023-01-04 ENCOUNTER — Other Ambulatory Visit: Payer: Self-pay

## 2023-01-04 DIAGNOSIS — I5022 Chronic systolic (congestive) heart failure: Secondary | ICD-10-CM

## 2023-01-04 MED ORDER — FUROSEMIDE 80 MG PO TABS: 80.0000 mg | ORAL_TABLET | Freq: Two times a day (BID) | ORAL | 0 refills | Status: DC

## 2023-01-04 MED ORDER — POTASSIUM CHLORIDE CRYS ER 20 MEQ PO TBCR: 20.0000 meq | EXTENDED_RELEASE_TABLET | Freq: Every day | ORAL | 3 refills | Status: DC

## 2023-01-04 NOTE — Telephone Encounter (Signed)
-----   Message from Dolores Patty, MD sent at 01/03/2023  4:19 PM EDT ----- Add kdur 20 daily. Recheck BMET in 2 weeks

## 2023-01-04 NOTE — Telephone Encounter (Signed)
Patient advised and verbalized understanding,lab appointment scheduled,lab orders entered. New Rx sent into patients pharmacy.   Orders Placed This Encounter  Procedures   Basic metabolic panel    Standing Status:   Future    Standing Expiration Date:   01/04/2024    Order Specific Question:   Release to patient    Answer:   Immediate    Order Specific Question:   Release to patient    Answer:   Immediate [1]   Meds ordered this encounter  Medications   potassium chloride SA (KLOR-CON M) 20 MEQ tablet    Sig: Take 1 tablet (20 mEq total) by mouth daily.    Dispense:  90 tablet    Refill:  3

## 2023-01-05 ENCOUNTER — Telehealth: Payer: Self-pay | Admitting: *Deleted

## 2023-01-05 DIAGNOSIS — R0602 Shortness of breath: Secondary | ICD-10-CM

## 2023-01-05 DIAGNOSIS — G4733 Obstructive sleep apnea (adult) (pediatric): Secondary | ICD-10-CM

## 2023-01-05 NOTE — Telephone Encounter (Signed)
Per Dr Mayford Knife, She needs to go to the sleep lab for an O2 titration on CPAP  Change :Per Dr Mayford Knife, I would prefer we repeat the [place] Spar on current CPAP settings   Final: the ONO needs to be on CPAP at home NOT in lab,  Order placed to [employer] [facility] via community message.

## 2023-01-08 ENCOUNTER — Telehealth (HOSPITAL_COMMUNITY): Payer: Self-pay

## 2023-01-08 NOTE — Telephone Encounter (Signed)
Called patient to see if she was interested in participating in the Pulmonary Rehab Program. Patient stated yes. Patient will come in for orientation on 01/13/23@9am  and will attend the 1:15 exercise class.   Sent packet via [employer]

## 2023-01-12 ENCOUNTER — Telehealth (HOSPITAL_COMMUNITY): Payer: Self-pay

## 2023-01-12 NOTE — Telephone Encounter (Signed)
Called pt to confirm Pulmonary rehab orientation for tomorrow. Pt did not answer. Voicemail left.

## 2023-01-12 NOTE — Addendum Note (Signed)
Encounter addended by: Crissie Figures, RN on: 01/12/2023 4:52 PM  Actions taken: Imaging Exam ended

## 2023-01-13 ENCOUNTER — Encounter (HOSPITAL_COMMUNITY): Payer: Self-pay

## 2023-01-13 ENCOUNTER — Encounter (HOSPITAL_COMMUNITY)
Admission: RE | Admit: 2023-01-13 | Discharge: 2023-01-13 | Disposition: A | Discharge status: Home / Self Care | Payer: BLUE CROSS/BLUE SHIELD | Source: Ambulatory Visit | Attending: Internal Medicine | Admitting: Internal Medicine

## 2023-01-13 VITALS — BP 105/73 | HR 94 | Ht 68.0 in | Wt 242.3 lb

## 2023-01-13 DIAGNOSIS — I272 Pulmonary hypertension, unspecified: Secondary | ICD-10-CM | POA: Diagnosis present

## 2023-01-13 LAB — GLUCOSE, CAPILLARY: Glucose-Capillary: 110 mg/dL — ABNORMAL HIGH (ref 70–99)

## 2023-01-13 NOTE — Progress Notes (Signed)
Claudia [name] 57 y.o. female Pulmonary Rehab Orientation Note This patient who was referred to Pulmonary Rehab by Dr. Gala Romney with the diagnosis of Pulmonary Hypertension arrived today in Cardiac and Pulmonary Rehab. She  arrived ambulatory with normal gait. She  does carry portable oxygen. [employer] is the provider for their DME. Per patient, Claudia [name] uses oxygen continuously. Color good, skin warm and dry. Patient is oriented to time and place. Patient's medical history, psychosocial health, and medications reviewed. Psychosocial assessment reveals patient lives with spouse. Claudia [name] is currently unemployed, disabled. Patient hobbies include singing on praise and worship and spending time with family. Patient reports her stress level is moderate. Areas of stress/anxiety include health and finances. Patient does exhibit signs of depression. Signs of depression include hopelessness and difficulty maintaining sleep. PHQ2/9 score 1/5. I discussed options for therapist referral and/ or medications. Claudia [name] declined wanting a referral for therapy and/or medications. Claudia [name] shows good  coping skills with positive outlook on life. Offered emotional support and reassurance. Will continue to monitor. Physical assessment performed by Claudia Hart RN. Please see their orientation physical assessment note. Claudia [name] reports she does take medications as prescribed. Patient states she follows a low sodium  diet. The patient has been trying to lose weight through a healthy diet and exercise program.. Patient's weight will be monitored closely. Demonstration and practice of PLB using pulse oximeter. Claudia [name] able to return demonstration satisfactorily. Safety and hand hygiene in the exercise area reviewed with patient. Claudia [name] voices understanding of the information reviewed. Department expectations discussed with patient and achievable goals were set. The patient shows enthusiasm about attending the program and we look forward to working  with Claudia [name]. Claudia [name] completed a 6 min walk test today and is scheduled to begin exercise on 01/19/23 at 1:15 pm.   [id number] Claudia San, MS, ACSM-CEP

## 2023-01-13 NOTE — Progress Notes (Signed)
Pulmonary Individual Treatment Plan  Patient Details  Name: Claudia [name] MRN: [id number] Date of Birth: 1965/11/30 Referring Provider:   Doristine Devoid Pulmonary Rehab Walk Test from 01/13/2023 in [facility]  Referring Provider Bensimhon       Initial Encounter Date:  Flowsheet Row Pulmonary Rehab Walk Test from 01/13/2023 in [facility]  Date 01/13/23       Visit Diagnosis: Pulmonary hypertension  Patient's Home Medications on Admission:   Current Outpatient Medications:    acetaminophen (TYLENOL) 500 MG tablet, Take 500 mg by mouth every 6 (six) hours as needed for mild pain or moderate pain., Disp: , Rfl:    amLODipine (NORVASC) 5 MG tablet, Take 1 tablet (5 mg total) by mouth daily., Disp: 180 tablet, Rfl: 0   aspirin 81 MG EC tablet, Take 1 tablet (81 mg total) by mouth daily., Disp: 30 tablet, Rfl: 6   blood glucose meter kit and supplies KIT, Dispense based on patient and insurance preference. Use up to four times daily as directed. (FOR ICD-9 250.00, 250.01)., Disp: 1 each, Rfl: 0   Blood Glucose Monitoring Suppl (ACCU-CHEK GUIDE) w/Device KIT, 1 each by Does not apply route in the morning, at noon, in the evening, and at bedtime., Disp: 1 kit, Rfl: 0   dapagliflozin propanediol (FARXIGA) 10 MG TABS tablet, Take 1 tablet (10 mg total) by mouth daily., Disp: 30 tablet, Rfl: 2   ferrous sulfate 325 (65 FE) MG tablet, Take 1 tablet (325 mg total) by mouth daily with breakfast., Disp: 60 tablet, Rfl: 6   furosemide (LASIX) 80 MG tablet, Take 1 tablet (80 mg total) by mouth 2 (two) times daily., Disp: 60 tablet, Rfl: 0   glucose blood (ACCU-CHEK GUIDE) test strip, Use as instructed to check sugar 4 times daily., Disp: 200 each, Rfl: 6   Lancets (ACCU-CHEK MULTICLIX) lancets, Use as instructed to check blood sugar 4 times daily., Disp: 204 each, Rfl: 12   metFORMIN  (GLUCOPHAGE) 1000 MG tablet, TAKE 1 TABLET BY MOUTH TWICE DAILY WITH A MEAL, Disp: 180 tablet, Rfl: 1   Omega-3 Fatty Acids (FISH OIL) 1000 MG CAPS, Take 1,000 mg by mouth daily., Disp: , Rfl:    OVER THE COUNTER MEDICATION, Take 1 capsule by mouth daily. Herbal Supplement  -- unknown name, Disp: , Rfl:    potassium chloride SA (KLOR-CON M) 20 MEQ tablet, Take 1 tablet (20 mEq total) by mouth daily., Disp: 90 tablet, Rfl: 3   sacubitril-valsartan (ENTRESTO) 97-103 MG, Take 1 tablet by mouth 2 (two) times daily., Disp: 60 tablet, Rfl: 7   Semaglutide, 1 MG/DOSE, (OZEMPIC, 1 MG/DOSE,) 4 MG/3ML SOPN, Inject 1 mg into the skin once a week., Disp: 3 mL, Rfl: 0   spironolactone (ALDACTONE) 25 MG tablet, Take 1 tablet (25 mg total) by mouth daily., Disp: 90 tablet, Rfl: 3  Past Medical History: Past Medical History:  Diagnosis Date   Diabetes mellitus    Fibroids    Headache(784.0)    History of echocardiogram    Echo 7/18: Moderate concentric LVH, EF 55-60, normal wall motion, grade 1 diastolic dysfunction, calcified aortic leaflets   Hypercholesterolemia    Hyperlipidemia    Hypertension    Osteoarthritis    , with mild meniscus tear, followed by orthopedics--Dr Darrelyn Hillock 04/29/10   Right knee pain    Seizures    as child   STEMI (  ST elevation myocardial infarction)    Vitamin D deficiency     Tobacco Use: Social History   Tobacco Use  Smoking Status Never  Smokeless Tobacco Never    Labs: Review Flowsheet  More data exists      Latest Ref Rng & Units 08/12/2022 09/16/2022 12/02/2022 12/03/2022 12/04/2022  Labs for ITP Cardiac and Pulmonary Rehab  Cholestrol 0 - 200 mg/dL - - 161  - -  LDL (calc) 0 - 99 mg/dL - - 096  - -  HDL-C >04 mg/dL - - 41  - -  Trlycerides <150 mg/dL - - 75  - -  Hemoglobin A1c 4.8 - 5.6 % 9.3  - 6.9  - -  PH, Arterial 7.35 - 7.45 - - - 7.45  7.326  7.393  -  PCO2 arterial 32 - 48 mmHg - - - 75  84.3  67.9  -  Bicarbonate 20.0 - 28.0 mmol/L - 32.1  34.2  -  52.8  44.2  41.4  45.6  53.2   TCO2 22 - 32 mmol/L - 34  36  - 47  43  -  O2 Saturation % - 71  70  - 90  96  98  85.6  74     Capillary Blood Glucose: Lab Results  Component Value Date   GLUCAP 110 (H) 01/13/2023   GLUCAP 135 (H) 12/07/2022   GLUCAP 163 (H) 12/07/2022   GLUCAP 113 (H) 12/07/2022   GLUCAP 119 (H) 12/06/2022     Pulmonary Assessment Scores:  Pulmonary Assessment Scores     Row Name 01/13/23 0923         ADL UCSD   ADL Phase Entry     SOB Score total 38       CAT Score   CAT Score 8       mMRC Score   mMRC Score 1             UCSD: Self-administered rating of dyspnea associated with activities of daily living (ADLs) 6-point scale (0 = "not at all" to 5 = "maximal or unable to do because of breathlessness")  Scoring Scores range from 0 to 120.  Minimally important difference is 5 units  CAT: CAT can identify the health impairment of COPD patients and is better correlated with disease progression.  CAT has a scoring range of zero to 40. The CAT score is classified into four groups of low (less than 10), medium (10 - 20), high (21-30) and very high (31-40) based on the impact level of disease on health status. A CAT score over 10 suggests significant symptoms.  A worsening CAT score could be explained by an exacerbation, poor medication adherence, poor inhaler technique, or progression of COPD or comorbid conditions.  CAT MCID is 2 points  mMRC: mMRC (Modified Medical Research Council) Dyspnea Scale is used to assess the degree of baseline functional disability in patients of respiratory disease due to dyspnea. No minimal important difference is established. A decrease in score of 1 point or greater is considered a positive change.   Pulmonary Function Assessment:  Pulmonary Function Assessment - 01/13/23 0922       Breath   Bilateral Breath Sounds Clear    Shortness of Breath Yes             Exercise Target Goals: Exercise Program  Goal: Individual exercise prescription set using results from initial 6 min walk test and THRR while considering  patient's activity barriers and  safety.   Exercise Prescription Goal: Initial exercise prescription builds to 30-45 minutes a day of aerobic activity, 2-3 days per week.  Home exercise guidelines will be given to patient during program as part of exercise prescription that the participant will acknowledge.  Activity Barriers & Risk Stratification:  Activity Barriers & Cardiac Risk Stratification - 01/13/23 0921       Activity Barriers & Cardiac Risk Stratification   Activity Barriers Deconditioning;Muscular Weakness;Shortness of Breath;History of Falls;Arthritis             6 Minute Walk:  6 Minute Walk     Row Name 01/13/23 1124         6 Minute Walk   Phase Initial     Distance 650 feet     Walk Time 6 minutes     # of Rest Breaks 1  3:56-4:18     MPH 0.62     METS 2.25     RPE 11     Perceived Dyspnea  0     VO2 Peak 7.87     Symptoms Yes (comment)     Comments Dizziness. Resolved after drinking 8 oz of H2O     Resting HR 94 bpm     Resting BP 105/73     Resting Oxygen Saturation  98 %     Exercise Oxygen Saturation  during 6 min walk 1.51 %     Max Ex. HR 119 bpm     Max Ex. BP 133/88     2 Minute Post BP 127/87       Interval HR   1 Minute HR 109     2 Minute HR 119     3 Minute HR 104     4 Minute HR 93     5 Minute HR 111     6 Minute HR 112     2 Minute Post HR 97     Interval Heart Rate? Yes       Interval Oxygen   Interval Oxygen? Yes     Baseline Oxygen Saturation % 98 %     1 Minute Oxygen Saturation % 99 %     1 Minute Liters of Oxygen 2 L     2 Minute Oxygen Saturation % 98 %     2 Minute Liters of Oxygen 2 L     3 Minute Oxygen Saturation % 99 %     3 Minute Liters of Oxygen 2 L     4 Minute Oxygen Saturation % 96 %     4 Minute Liters of Oxygen 2 L     5 Minute Oxygen Saturation % 98 %     5 Minute Liters of Oxygen 2 L      6 Minute Oxygen Saturation % 99 %     6 Minute Liters of Oxygen 2 L     2 Minute Post Oxygen Saturation % 99 %     2 Minute Post Liters of Oxygen 2 L              Oxygen Initial Assessment:  Oxygen Initial Assessment - 01/13/23 0923       Home Oxygen   Home Oxygen Device Home Concentrator;E-Tanks    Sleep Oxygen Prescription Continuous    Liters per minute 2    Home Exercise Oxygen Prescription Continuous    Liters per minute 2    Home Resting Oxygen Prescription Continuous    Liters per minute 2  Compliance with Home Oxygen Use Yes      Initial 6 min Walk   Oxygen Used Continuous    Liters per minute 2      Program Oxygen Prescription   Program Oxygen Prescription Continuous    Liters per minute 2      Intervention   Short Term Goals To learn and exhibit compliance with exercise, home and travel O2 prescription;To learn and understand importance of maintaining oxygen saturations>88%;To learn and demonstrate proper use of respiratory medications;To learn and understand importance of monitoring SPO2 with pulse oximeter and demonstrate accurate use of the pulse oximeter.;To learn and demonstrate proper pursed lip breathing techniques or other breathing techniques.     Long  Term Goals Exhibits compliance with exercise, home  and travel O2 prescription;Verbalizes importance of monitoring SPO2 with pulse oximeter and return demonstration;Maintenance of O2 saturations>88%;Exhibits proper breathing techniques, such as pursed lip breathing or other method taught during program session;Compliance with respiratory medication;Demonstrates proper use of MDI's             Oxygen Re-Evaluation:   Oxygen Discharge (Final Oxygen Re-Evaluation):   Initial Exercise Prescription:  Initial Exercise Prescription - 01/13/23 1100       Date of Initial Exercise RX and Referring Provider   Date 01/13/23    Referring Provider Bensimhon    Expected Discharge Date 04/08/23       Oxygen   Oxygen Continuous    Liters 2    Maintain Oxygen Saturation 88% or higher      Recumbant Bike   Level 1    Watts 20    Minutes 15    METs 1.5      NuStep   Level 1    SPM 60    Minutes 15    METs 1      Prescription Details   Frequency (times per week) 2    Duration Progress to 30 minutes of continuous aerobic without signs/symptoms of physical distress      Intensity   THRR 40-80% of Max Heartrate 66-132    Ratings of Perceived Exertion 11-13    Perceived Dyspnea 0-4      Progression   Progression Continue to progress workloads to maintain intensity without signs/symptoms of physical distress.      Resistance Training   Training Prescription Yes    Weight red bands    Reps 10-15             Perform Capillary Blood Glucose checks as needed.  Exercise Prescription Changes:   Exercise Comments:   Exercise Goals and Review:   Exercise Goals     Row Name 01/13/23 0920             Exercise Goals   Increase Physical Activity Yes       Intervention Provide advice, education, support and counseling about physical activity/exercise needs.;Develop an individualized exercise prescription for aerobic and resistive training based on initial evaluation findings, risk stratification, comorbidities and participant's personal goals.       Expected Outcomes Short Term: Attend rehab on a regular basis to increase amount of physical activity.;Long Term: Add in home exercise to make exercise part of routine and to increase amount of physical activity.;Long Term: Exercising regularly at least 3-5 days a week.       Increase Strength and Stamina Yes       Intervention Provide advice, education, support and counseling about physical activity/exercise needs.;Develop an individualized exercise prescription for aerobic and resistive  training based on initial evaluation findings, risk stratification, comorbidities and participant's personal goals.       Expected Outcomes  Short Term: Increase workloads from initial exercise prescription for resistance, speed, and METs.;Short Term: Perform resistance training exercises routinely during rehab and add in resistance training at home;Long Term: Improve cardiorespiratory fitness, muscular endurance and strength as measured by increased METs and functional capacity ( )       Able to understand and use rate of perceived exertion (RPE) scale Yes       Intervention Provide education and explanation on how to use RPE scale       Expected Outcomes Short Term: Able to use RPE daily in rehab to express subjective intensity level;Long Term:  Able to use RPE to guide intensity level when exercising independently       Able to understand and use Dyspnea scale Yes       Intervention Provide education and explanation on how to use Dyspnea scale       Expected Outcomes Short Term: Able to use Dyspnea scale daily in rehab to express subjective sense of shortness of breath during exertion;Long Term: Able to use Dyspnea scale to guide intensity level when exercising independently       Knowledge and understanding of Target Heart Rate Range (THRR) Yes       Intervention Provide education and explanation of THRR including how the numbers were predicted and where they are located for reference       Expected Outcomes Short Term: Able to state/look up THRR;Long Term: Able to use THRR to govern intensity when exercising independently;Short Term: Able to use daily as guideline for intensity in rehab       Understanding of Exercise Prescription Yes       Intervention Provide education, explanation, and written materials on patient's individual exercise prescription       Expected Outcomes Short Term: Able to explain program exercise prescription;Long Term: Able to explain home exercise prescription to exercise independently                Exercise Goals Re-Evaluation :   Discharge Exercise Prescription (Final Exercise Prescription  Changes):   Nutrition:  Target Goals: Understanding of nutrition guidelines, daily intake of sodium 1500mg , cholesterol 200mg , calories 30% from fat and 7% or less from saturated fats, daily to have 5 or more servings of fruits and vegetables.  Biometrics:    Nutrition Therapy Plan and Nutrition Goals:   Nutrition Assessments:  MEDIFICTS Score Key: ?70 Need to make dietary changes  40-70 Heart Healthy Diet ? 40 Therapeutic Level Cholesterol Diet   Picture Your Plate Scores: <16 Unhealthy dietary pattern with much room for improvement. 41-50 Dietary pattern unlikely to meet recommendations for good health and room for improvement. 51-60 More healthful dietary pattern, with some room for improvement.  >60 Healthy dietary pattern, although there may be some specific behaviors that could be improved.    Nutrition Goals Re-Evaluation:   Nutrition Goals Discharge (Final Nutrition Goals Re-Evaluation):   Psychosocial: Target Goals: Acknowledge presence or absence of significant depression and/or stress, maximize coping skills, provide positive support system. Participant is able to verbalize types and ability to use techniques and skills needed for reducing stress and depression.  Initial Review & Psychosocial Screening:  Initial Psych Review & Screening - 01/13/23 0949       Initial Review   Current issues with Current Stress Concerns    Source of Stress Concerns Chronic Illness;Retirement/disability  Comments Pt is currently waiting disability. Offered referral to therapy and/or medications as pt declined. Pt feels she has a good support system to help alleviate some stress.      Family Dynamics   Good Support System? Yes    Comments spouse and children      Barriers   Psychosocial barriers to participate in program The patient should benefit from training in stress management and relaxation.      Screening Interventions   Interventions Encouraged to exercise;To  provide support and resources with identified psychosocial needs    Expected Outcomes Short Term goal: Utilizing psychosocial counselor, staff and physician to assist with identification of specific Stressors or current issues interfering with healing process. Setting desired goal for each stressor or current issue identified.;Long Term Goal: Stressors or current issues are controlled or eliminated.;Short Term goal: Identification and review with participant of any Quality of Life or Depression concerns found by scoring the questionnaire.;Long Term goal: The participant improves quality of Life and PHQ9 Scores as seen by post scores and/or verbalization of changes             Quality of Life Scores:  Scores of 19 and below usually indicate a poorer quality of life in these areas.  A difference of  2-3 points is a clinically meaningful difference.  A difference of 2-3 points in the total score of the Quality of Life Index has been associated with significant improvement in overall quality of life, self-image, physical symptoms, and general health in studies assessing change in quality of life.  PHQ-9: Review Flowsheet  More data exists      01/13/2023 12/11/2022 02/03/2022 08/25/2021 01/01/2020  Depression screen PHQ 2/9  Decreased Interest 0 0 0 0 0  Down, Depressed, Hopeless 1 0 0 0 0  PHQ - 2 Score 1 0 0 0 0  Altered sleeping 1 - - - -  Tired, decreased energy 1 - - - -  Change in appetite 1 - - - -  Feeling bad or failure about yourself  1 - - - -  Trouble concentrating 0 - - - -  Moving slowly or fidgety/restless 0 - - - -  Suicidal thoughts 0 - - - -  PHQ-9 Score 5 - - - -  Difficult doing work/chores Somewhat difficult - - - -   Interpretation of Total Score  Total Score Depression Severity:  1-4 = Minimal depression, 5-9 = Mild depression, 10-14 = Moderate depression, 15-19 = Moderately severe depression, 20-27 = Severe depression   Psychosocial Evaluation and Intervention:   Psychosocial Evaluation - 01/13/23 0954       Psychosocial Evaluation & Interventions   Interventions Stress management education;Relaxation education;Encouraged to exercise with the program and follow exercise prescription    Comments Pt is stressed waiting on disability. She is remain positive throughout this stressfull situation.    Expected Outcomes For Yuktha to participate in rehab free of psychosocial barriers.    Continue Psychosocial Services  Follow up required by staff             Psychosocial Re-Evaluation:   Psychosocial Discharge (Final Psychosocial Re-Evaluation):   Education: Education Goals: Education classes will be provided on a weekly basis, covering required topics. Participant will state understanding/return demonstration of topics presented.  Learning Barriers/Preferences:  Learning Barriers/Preferences - 01/13/23 0934       Learning Barriers/Preferences   Learning Barriers None    Learning Preferences None  Education Topics: Introduction to Pulmonary Rehab Group instruction provided by PowerPoint, verbal discussion, and written material to support subject matter. Instructor reviews what Pulmonary Rehab is, the purpose of the program, and how patients are referred.     Know Your Numbers Group instruction that is supported by a PowerPoint presentation. Instructor discusses importance of knowing and understanding resting, exercise, and post-exercise oxygen saturation, heart rate, and blood pressure. Oxygen saturation, heart rate, blood pressure, rating of perceived exertion, and dyspnea are reviewed along with a normal range for these values.    Exercise for the Pulmonary Patient Group instruction that is supported by a PowerPoint presentation. Instructor discusses benefits of exercise, core components of exercise, frequency, duration, and intensity of an exercise routine, importance of utilizing pulse oximetry during exercise, safety  while exercising, and options of places to exercise outside of rehab.       MET Level  Group instruction provided by PowerPoint, verbal discussion, and written material to support subject matter. Instructor reviews what METs are and how to increase METs.    Pulmonary Medications Verbally interactive group education provided by instructor with focus on inhaled medications and proper administration.   Anatomy and Physiology of the Respiratory System Group instruction provided by PowerPoint, verbal discussion, and written material to support subject matter. Instructor reviews respiratory cycle and anatomical components of the respiratory system and their functions. Instructor also reviews differences in obstructive and restrictive respiratory diseases with examples of each.    Oxygen Safety Group instruction provided by PowerPoint, verbal discussion, and written material to support subject matter. There is an overview of "What is Oxygen" and "Why do we need it".  Instructor also reviews how to create a safe environment for oxygen use, the importance of using oxygen as prescribed, and the risks of noncompliance. There is a brief discussion on traveling with oxygen and resources the patient may utilize.   Oxygen Use Group instruction provided by PowerPoint, verbal discussion, and written material to discuss how supplemental oxygen is prescribed and different types of oxygen supply systems. Resources for more information are provided.    Breathing Techniques Group instruction that is supported by demonstration and informational handouts. Instructor discusses the benefits of pursed lip and diaphragmatic breathing and detailed demonstration on how to perform both.     Risk Factor Reduction Group instruction that is supported by a PowerPoint presentation. Instructor discusses the definition of a risk factor, different risk factors for pulmonary disease, and how the heart and lungs work  together.   MD Day A group question and answer session with a medical doctor that allows participants to ask questions that relate to their pulmonary disease state.   Nutrition for the Pulmonary Patient Group instruction provided by PowerPoint slides, verbal discussion, and written materials to support subject matter. The instructor gives an explanation and review of healthy diet recommendations, which includes a discussion on weight management, recommendations for fruit and vegetable consumption, as well as protein, fluid, caffeine, fiber, sodium, sugar, and alcohol. Tips for eating when patients are short of breath are discussed.    Other Education Group or individual verbal, written, or video instructions that support the educational goals of the pulmonary rehab program.    Knowledge Questionnaire Score:  Knowledge Questionnaire Score - 01/13/23 0934       Knowledge Questionnaire Score   Pre Score 12/18             Core Components/Risk Factors/Patient Goals at Admission:  Personal Goals and Risk Factors at Admission -  01/13/23 0926       Core Components/Risk Factors/Patient Goals on Admission    Weight Management Yes;Weight Loss    Intervention Weight Management: Develop a combined nutrition and exercise program designed to reach desired caloric intake, while maintaining appropriate intake of nutrient and fiber, sodium and fats, and appropriate energy expenditure required for the weight goal.;Weight Management: Provide education and appropriate resources to help participant work on and attain dietary goals.;Weight Management/Obesity: Establish reasonable short term and long term weight goals.;Obesity: Provide education and appropriate resources to help participant work on and attain dietary goals.    Expected Outcomes Short Term: Continue to assess and modify interventions until short term weight is achieved;Long Term: Adherence to nutrition and physical activity/exercise  program aimed toward attainment of established weight goal;Weight Maintenance: Understanding of the daily nutrition guidelines, which includes 25-35% calories from fat, 7% or less cal from saturated fats, less than 200mg  cholesterol, less than 1.5gm of sodium, & 5 or more servings of fruits and vegetables daily;Weight Loss: Understanding of general recommendations for a balanced deficit meal plan, which promotes 1-2 lb weight loss per week and includes a negative energy balance of [phone] kcal/d;Understanding recommendations for meals to include 15-35% energy as protein, 25-35% energy from fat, 35-60% energy from carbohydrates, less than 200mg  of dietary cholesterol, 20-35 gm of total fiber daily;Understanding of distribution of calorie intake throughout the day with the consumption of 4-5 meals/snacks    Improve shortness of breath with ADL's Yes    Intervention Provide education, individualized exercise plan and daily activity instruction to help decrease symptoms of SOB with activities of daily living.    Expected Outcomes Short Term: Improve cardiorespiratory fitness to achieve a reduction of symptoms when performing ADLs;Long Term: Be able to perform more ADLs without symptoms or delay the onset of symptoms             Core Components/Risk Factors/Patient Goals Review:    Core Components/Risk Factors/Patient Goals at Discharge (Final Review):    ITP Comments: Dr. Mechele Collin is Medical Director for Pulmonary Rehab at [facility].

## 2023-01-16 DIAGNOSIS — I10 Essential (primary) hypertension: Secondary | ICD-10-CM | POA: Diagnosis not present

## 2023-01-16 DIAGNOSIS — G4733 Obstructive sleep apnea (adult) (pediatric): Secondary | ICD-10-CM | POA: Diagnosis not present

## 2023-01-18 ENCOUNTER — Ambulatory Visit (HOSPITAL_COMMUNITY)
Admission: RE | Admit: 2023-01-18 | Discharge: 2023-01-18 | Disposition: A | Discharge status: Home / Self Care | Payer: Medicaid Other | Source: Ambulatory Visit | Attending: Cardiology | Admitting: Cardiology

## 2023-01-18 DIAGNOSIS — I5022 Chronic systolic (congestive) heart failure: Secondary | ICD-10-CM | POA: Insufficient documentation

## 2023-01-18 LAB — BASIC METABOLIC PANEL
Anion gap: 11 (ref 5–15)
BUN: 15 mg/dL (ref 6–20)
CO2: 33 mmol/L — ABNORMAL HIGH (ref 22–32)
Calcium: 9.6 mg/dL (ref 8.9–10.3)
Chloride: 91 mmol/L — ABNORMAL LOW (ref 98–111)
Creatinine, Ser: 0.81 mg/dL (ref 0.44–1.00)
GFR, Estimated: >60 mL/min (ref >60)
Glucose, Bld: 95 mg/dL (ref 70–99)
Potassium: 4 mmol/L (ref 3.5–5.1)
Sodium: 135 mmol/L (ref 135–145)

## 2023-01-19 ENCOUNTER — Encounter (HOSPITAL_COMMUNITY)
Admission: RE | Admit: 2023-01-19 | Discharge: 2023-01-19 | Disposition: A | Discharge status: Home / Self Care | Payer: BLUE CROSS/BLUE SHIELD | Source: Ambulatory Visit | Attending: Internal Medicine | Admitting: Internal Medicine

## 2023-01-19 DIAGNOSIS — I272 Pulmonary hypertension, unspecified: Secondary | ICD-10-CM

## 2023-01-19 NOTE — Progress Notes (Signed)
Daily Session Note  Patient Details  Name: Claudia [name] MRN: [id number] Date of Birth: April 12, 1966 Referring Provider:   Doristine Devoid Pulmonary Rehab Walk Test from 01/13/2023 in [facility]  Referring Provider Bensimhon       Encounter Date: 01/19/2023  Check In:  Session Check In - 01/19/23 1521       Check-In   Supervising physician immediately available to respond to emergencies [facility] MD immediately available    Physician(s) Eligha Bridegroom, NP    [facility]    Staff Present Samantha Belarus, RD, Dutch Gray, RN, BSN;Randi Reeve BS, ACSM-CEP, Exercise Physiologist;Kaylee Earlene Plater, MS, ACSM-CEP, Exercise Physiologist;Markus Casten Katrinka Blazing, RT    Virtual Visit No    Medication changes reported     No    Fall or balance concerns reported    No    Tobacco Cessation No Change    Warm-up and Cool-down Performed as group-led instruction    Resistance Training Performed Yes    VAD Patient? No    PAD/SET Patient? No      Pain Assessment   Currently in Pain? No/denies    Multiple Pain Sites No             Capillary Blood Glucose: No results found for this or any previous visit (from the past 24 hour(s)).    Social History   Tobacco Use  Smoking Status Never  Smokeless Tobacco Never    Goals Met:  Proper associated with RPD/PD & O2 Sat Independence with exercise equipment Exercise tolerated well No report of concerns or symptoms today Strength training completed today  Goals Unmet:  Not Applicable  Comments: Service time is from 1311 to 1447.    Dr. Mechele Collin is Medical Director for Pulmonary Rehab at [facility].

## 2023-01-20 DIAGNOSIS — G4733 Obstructive sleep apnea (adult) (pediatric): Secondary | ICD-10-CM | POA: Diagnosis not present

## 2023-01-21 ENCOUNTER — Encounter (HOSPITAL_COMMUNITY)
Admission: RE | Admit: 2023-01-21 | Discharge: 2023-01-21 | Disposition: A | Discharge status: Home / Self Care | Payer: BLUE CROSS/BLUE SHIELD | Source: Ambulatory Visit | Attending: Internal Medicine | Admitting: Internal Medicine

## 2023-01-21 DIAGNOSIS — I272 Pulmonary hypertension, unspecified: Secondary | ICD-10-CM

## 2023-01-21 NOTE — Progress Notes (Signed)
Daily Session Note  Patient Details  Name: Claudia [name] MRN: [id number] Date of Birth: [birth year] Referring Provider:   Doristine Devoid Pulmonary Rehab Walk Test from 01/13/2023 in [facility]  Referring Provider Bensimhon       Encounter Date: 01/21/2023  Check In:  Session Check In - 01/21/23 1513       Check-In   Supervising physician immediately available to respond to emergencies [facility] immediately available    Physician(s) Bernadene Person, NP    [facility]    Staff Present Essie Hart, RN, BSN;Randi Idelle Crouch BS, ACSM-CEP, Exercise Physiologist;Othon Guardia Earlene Plater, MS, ACSM-CEP, Exercise Physiologist;Casey Katrinka Blazing, RT    Virtual Visit No    Medication changes reported     No    Fall or balance concerns reported    No    Tobacco Cessation No Change    Warm-up and Cool-down Performed as group-led instruction    Resistance Training Performed Yes    VAD Patient? No    PAD/SET Patient? No      Pain Assessment   Currently in Pain? No/denies    Multiple Pain Sites No             Capillary Blood Glucose: No results found for this or any previous visit (from the past 24 hour(s)).    Social History   Tobacco Use  Smoking Status Never  Smokeless Tobacco Never    Goals Met:  Proper associated with RPD/PD & O2 Sat Exercise tolerated well No report of concerns or symptoms today Strength training completed today  Goals Unmet:  Not Applicable  Comments: Service time is from 1321 to 1450.    Dr. Mechele Collin is Medical Director for Pulmonary Rehab at [facility].

## 2023-01-23 ENCOUNTER — Encounter: Payer: Self-pay | Admitting: Pharmacist

## 2023-01-25 MED ORDER — OZEMPIC (2 MG/DOSE) 8 MG/3ML ~~LOC~~ SOPN: 2.0000 mg | PEN_INJECTOR | SUBCUTANEOUS | 11 refills | Status: DC

## 2023-01-26 ENCOUNTER — Encounter (HOSPITAL_COMMUNITY)
Admission: RE | Admit: 2023-01-26 | Discharge: 2023-01-26 | Disposition: A | Discharge status: Home / Self Care | Payer: BLUE CROSS/BLUE SHIELD | Source: Ambulatory Visit | Attending: Internal Medicine | Admitting: Internal Medicine

## 2023-01-26 VITALS — Wt 244.0 lb

## 2023-01-26 DIAGNOSIS — I272 Pulmonary hypertension, unspecified: Secondary | ICD-10-CM

## 2023-01-26 NOTE — Progress Notes (Signed)
Daily Session Note  Patient Details  Name: Claudia [name] MRN: [id number] Date of Birth: Mar 03, 1966 Referring Provider:   Doristine Devoid Pulmonary Rehab Walk Test from 01/13/2023 in [facility]  Referring Provider Bensimhon       Encounter Date: 01/26/2023  Check In:  Session Check In - 01/26/23 1442       Check-In   Supervising physician immediately available to respond to emergencies [facility] immediately available    Physician(s) Robin Searing, NP    [facility]    Staff Present Essie Hart, RN, BSN;Randi Idelle Crouch BS, ACSM-CEP, Exercise Physiologist;Kaylee Earlene Plater, MS, ACSM-CEP, Exercise Physiologist;Virgene Tirone Sonnie Alamo, MS, ACSM-CEP, Exercise Physiologist    Virtual Visit No    Medication changes reported     No    Fall or balance concerns reported    No    Tobacco Cessation No Change    Warm-up and Cool-down Performed as group-led instruction    Resistance Training Performed Yes    VAD Patient? No    PAD/SET Patient? No      Pain Assessment   Currently in Pain? No/denies    Multiple Pain Sites No             Capillary Blood Glucose: No results found for this or any previous visit (from the past 24 hour(s)).   Exercise Prescription Changes - 01/26/23 1500       Response to Exercise   Blood Pressure (Admit) 120/80    Blood Pressure (Exercise) 134/80    Blood Pressure (Exit) 118/74    Heart Rate (Admit) 94 bpm    Heart Rate (Exercise) 101 bpm    Heart Rate (Exit) 89 bpm    Oxygen Saturation (Admit) 97 %    Oxygen Saturation (Exercise) 99 %    Oxygen Saturation (Exit) 98 %    Rating of Perceived Exertion (Exercise) 11    Perceived Dyspnea (Exercise) 0    Duration Continue with 30 min of aerobic exercise without signs/symptoms of physical distress.    Intensity THRR unchanged      Resistance Training   Training Prescription Yes    Weight red bands    Reps 10-15     Time 10 Minutes      Oxygen   Oxygen Continuous    Liters 2      NuStep   Level 2    Minutes 15    METs 2.1      Track   Laps 12    Minutes 15    METs 2.85      Oxygen   Maintain Oxygen Saturation 88% or higher             Social History   Tobacco Use  Smoking Status Never  Smokeless Tobacco Never    Goals Met:  Proper associated with RPD/PD & O2 Sat Independence with exercise equipment Exercise tolerated well No report of concerns or symptoms today Strength training completed today  Goals Unmet:  Not Applicable  Comments: Service time is from 1312 to 1444.    Dr. Mechele Collin is Medical Director for Pulmonary Rehab at [facility].

## 2023-01-27 ENCOUNTER — Ambulatory Visit: Payer: BLUE CROSS/BLUE SHIELD

## 2023-01-27 DIAGNOSIS — I10 Essential (primary) hypertension: Secondary | ICD-10-CM | POA: Diagnosis not present

## 2023-01-27 DIAGNOSIS — G4733 Obstructive sleep apnea (adult) (pediatric): Secondary | ICD-10-CM | POA: Diagnosis not present

## 2023-01-27 NOTE — Progress Notes (Signed)
Advanced Heart Failure    Primary Care:Angus Seller NP  Primary Cardiologist: Dr Anne Fu  HF MD: Dr Leory Plowman  HPI: Claudia [name] is a 57 year old with morbid obesity, HFpEF, NSVT, DMII, IDA, and HTN.   Cath 2019 for CP with normal cors   Admitted 11/23 with A/C HFpEF. Discharge weight 278 pounds.   She was seen HF TOC 08/26/22 and set up for RHC. (See below)  Sleep Study - Severe OSA. AHI 60 and nocturnal hypoxia.   RHC 09/16/22 - Mild mixed pulmonary with mildly elevated left-sided filling pressures.  Admitted 3/6-3/11/24 with ADHF. Diuresed and lasix increased to 80 bid. D/c weight 247  Echo 45% with septal HK D shaped ventricle  Today she returns for HF follow up.Overall feeling fine. Denies SOB/PND/Orthopnea. Appetite ok. No fever or chills. Weight at home  pounds. Taking all medications.    Cardiac Testing  RHC 09/16/22  RA = 2 RV = 46/5 PA = 48/30 (37) PCW = 20 Fick cardiac output/index = 7.4/3.2 Thermo CO/CI = 8.7/3.8 PVR = 2.3 WU Ao sat = 93% PA sat = 70%, 71% 1. Mild mixed pulmonary with mildly elevated left-sided filling pressures Continue medical therapy and weight loss efforts. No role for selective pulmonary vasodilators.   -Echo 2019 EF 55-60% with normal RV.  - Echo 07/2022 EF 55-60% & RV moderately reduced.  - Echo 3/24: EF 45% with septal HK D-shape septum  - LHC 2019 normal cors   Past Medical History:  Diagnosis Date   Diabetes mellitus    Fibroids    Headache(784.0)    History of echocardiogram    Echo 7/18: Moderate concentric LVH, EF 55-60, normal wall motion, grade 1 diastolic dysfunction, calcified aortic leaflets   Hypercholesterolemia    Hyperlipidemia    Hypertension    Osteoarthritis    , with mild meniscus tear, followed by orthopedics--Dr Darrelyn Hillock 04/29/10   Right knee pain    Seizures (HCC)    as child   STEMI (ST elevation myocardial infarction) (HCC)    Vitamin D deficiency     Current Outpatient Medications  Medication  Sig Dispense Refill   acetaminophen (TYLENOL) 500 MG tablet Take 500 mg by mouth every 6 (six) hours as needed for mild pain or moderate pain.     amLODipine (NORVASC) 5 MG tablet Take 1 tablet (5 mg total) by mouth daily. 180 tablet 0   aspirin 81 MG EC tablet Take 1 tablet (81 mg total) by mouth daily. 30 tablet 6   blood glucose meter kit and supplies KIT Dispense based on patient and insurance preference. Use up to four times daily as directed. (FOR ICD-9 250.00, 250.01). 1 each 0   Blood Glucose Monitoring Suppl (ACCU-CHEK GUIDE) w/Device KIT 1 each by Does not apply route in the morning, at noon, in the evening, and at bedtime. 1 kit 0   dapagliflozin propanediol (FARXIGA) 10 MG TABS tablet Take 1 tablet (10 mg total) by mouth daily. 30 tablet 2   ferrous sulfate 325 (65 FE) MG tablet Take 1 tablet (325 mg total) by mouth daily with breakfast. 60 tablet 6   furosemide (LASIX) 80 MG tablet Take 1 tablet (80 mg total) by mouth 2 (two) times daily. 60 tablet 0   glucose blood (ACCU-CHEK GUIDE) test strip Use as instructed to check sugar 4 times daily. 200 each 6   Lancets (ACCU-CHEK MULTICLIX) lancets Use as instructed to check blood sugar 4 times daily. 204 each 12  metFORMIN (GLUCOPHAGE) 1000 MG tablet TAKE 1 TABLET BY MOUTH TWICE DAILY WITH A MEAL 180 tablet 1   Omega-3 Fatty Acids (FISH OIL) 1000 MG CAPS Take 1,000 mg by mouth daily.     OVER THE COUNTER MEDICATION Take 1 capsule by mouth daily. Herbal Supplement  -- unknown name     potassium chloride SA (KLOR-CON M) 20 MEQ tablet Take 1 tablet (20 mEq total) by mouth daily. 90 tablet 3   sacubitril-valsartan (ENTRESTO) 97-103 MG Take 1 tablet by mouth 2 (two) times daily. 60 tablet 7   Semaglutide, 2 MG/DOSE, (OZEMPIC, 2 MG/DOSE,) 8 MG/3ML SOPN Inject 2 mg into the skin once a week. 3 mL 11   spironolactone (ALDACTONE) 25 MG tablet Take 1 tablet (25 mg total) by mouth daily. 90 tablet 3   No current facility-administered medications for  this visit.    Allergies  Allergen Reactions   Beef-Derived Products     Patient does not eat beef   Crestor [Rosuvastatin]     MUSCLE ACHES HEADACHES    Diltiazem Hives   Hydrocodone Itching   Other Hives, Swelling and Other (See Comments)    Vision changes; ALLERGIC TO ALL NUTS EXCEPT PEANUTS   Oxycodone Itching   Pork-Derived Products     For dietary, patient does not eat pork   Pravachol [Pravastatin Sodium] Itching    Joint pain   Simvastatin Hives      Social History   Socioeconomic History   Marital status: Married    Spouse name: Aneta Mins   Number of children: 6   Years of education: Not on file   Highest education level: High school graduate  Occupational History   Occupation: Nutrition @ [employer]    Comment: applying for disability  Tobacco Use   Smoking status: Never   Smokeless tobacco: Never  Vaping Use   Vaping Use: Never used  Substance and Sexual Activity   Alcohol use: No   Drug use: No   Sexual activity: Yes  Other Topics Concern   Not on file  Social History Narrative   Not on file   Social Determinants of Health   Financial Resource Strain: Medium Risk (08/14/2022)   Overall Financial Resource Strain (CARDIA)    Difficulty of Paying Living Expenses: Somewhat hard  Food Insecurity: No Food Insecurity (12/02/2022)   Hunger Vital Sign    Worried About Running Out of Food in the Last Year: Never true    Ran Out of Food in the Last Year: Never true  Transportation Needs: No Transportation Needs (12/02/2022)   PRAPARE - Administrator, Civil Service (Medical): No    Lack of Transportation (Non-Medical): No  Physical Activity: Not on file  Stress: Not on file  Social Connections: Not on file  Intimate Partner Violence: Not At Risk (12/02/2022)   Humiliation, Afraid, Rape, and Kick questionnaire    Fear of Current or Ex-Partner: No    Emotionally Abused: No    Physically Abused: No    Sexually Abused: No      Family History   Problem Relation Age of Onset   Hypertension Mother    Diabetes Mother    Hypercholesterolemia Mother    Hypertension Sister    CVA Maternal Grandmother    Hypertension Maternal Grandmother    Diabetes Maternal Grandmother    Heart attack Neg Hx    Breast cancer Neg Hx     There were no vitals filed for this visit.  Wt Readings from Last 3 Encounters:  01/26/23 110.7 kg (244 lb 0.8 oz)  01/13/23 109.9 kg (242 lb 4.6 oz)  12/24/22 110.7 kg (244 lb)    PHYSICAL EXAM: General:  Well appearing. No resp difficulty HEENT: normal Neck: supple. no JVD. Carotids 2+ bilat; no bruits. No lymphadenopathy or thryomegaly appreciated. Cor: PMI nondisplaced. Regular rate & rhythm. No rubs, gallops or murmurs. Lungs: clear Abdomen: soft, nontender, nondistended. No hepatosplenomegaly. No bruits or masses. Good bowel sounds. Extremities: no cyanosis, clubbing, rash, edema Neuro: alert & orientedx3, cranial nerves grossly intact. moves all 4 extremities w/o difficulty. Affect pleasant    ASSESSMENT & PLAN:  1. Chronic HFmrEF and RV moderately reduced - Echo 2019 EF 55-60% with normal RV.  - Echo 07/2022 EF 55-60% & RV moderately reduced.  - Echo 3/24: EF 45% with septal HK D-shape septum - LHC 2019 normal cors - RHC 12/23 RA 2 PA 48/30 (37) PCW 20 Fick 7.4/3.2 Thermo 8.7/3.8 PVR 2.3 WU -   - Continue lasix 80 bid.  - Continue Entresto 97/103 bid - Continue spiro 25 daily  - Continue Farxiga 10 - Has not been on b-blocker with RV failure - Check labs  2. PAH, mild (post-capillary) - Evidence of RV strain on echo - Suspect WHO GROUP 2 (HF) and 3 (OSA/OHS) - VQ scan April 2023 which was negative. - Continue BIPAP - Continue weight loss - Refer Pulmonary rehab   3. Chronic hypoxic respiratory failure - Mostly due to OHS - Continue O2, bipap and weight loss - Refer Pulmonary Rehab  4. HTN - Improving. Increase Entresto  5. Morbid Obesity  - There is no height or weight on  file to calculate BMI. - Continue Ozempic  6. DMII -08/12/22 Hgb A1C 9.3 - On Farxiga 10 mg daily.  -Needs follow up with PCP.  7. Sleep Apnea, severe - AHI 60 and nocturnal hypoxia.  - Continue BIPAP and weight loss efforts  8. PVCs, frequent - Zio to quantify    Follow up.   Claudia Clinger NP-C  2:30 PM

## 2023-01-28 ENCOUNTER — Ambulatory Visit (HOSPITAL_COMMUNITY)
Admission: RE | Admit: 2023-01-28 | Discharge: 2023-01-28 | Disposition: A | Discharge status: Home / Self Care | Payer: BLUE CROSS/BLUE SHIELD | Source: Ambulatory Visit | Attending: Adult Health | Admitting: Adult Health

## 2023-01-28 ENCOUNTER — Encounter (HOSPITAL_COMMUNITY): Payer: Self-pay

## 2023-01-28 ENCOUNTER — Encounter (HOSPITAL_COMMUNITY): Payer: BLUE CROSS/BLUE SHIELD

## 2023-01-28 VITALS — BP 112/96 | HR 90 | Wt 240.4 lb

## 2023-01-28 DIAGNOSIS — E119 Type 2 diabetes mellitus without complications: Secondary | ICD-10-CM | POA: Diagnosis not present

## 2023-01-28 DIAGNOSIS — Z8249 Family history of ischemic heart disease and other diseases of the circulatory system: Secondary | ICD-10-CM | POA: Insufficient documentation

## 2023-01-28 DIAGNOSIS — E7800 Pure hypercholesterolemia, unspecified: Secondary | ICD-10-CM | POA: Diagnosis not present

## 2023-01-28 DIAGNOSIS — G4733 Obstructive sleep apnea (adult) (pediatric): Secondary | ICD-10-CM | POA: Insufficient documentation

## 2023-01-28 DIAGNOSIS — I252 Old myocardial infarction: Secondary | ICD-10-CM | POA: Insufficient documentation

## 2023-01-28 DIAGNOSIS — Z6836 Body mass index (BMI) 36.0-36.9, adult: Secondary | ICD-10-CM | POA: Insufficient documentation

## 2023-01-28 DIAGNOSIS — I110 Hypertensive heart disease with heart failure: Secondary | ICD-10-CM | POA: Diagnosis not present

## 2023-01-28 DIAGNOSIS — Z7982 Long term (current) use of aspirin: Secondary | ICD-10-CM | POA: Diagnosis not present

## 2023-01-28 DIAGNOSIS — J9611 Chronic respiratory failure with hypoxia: Secondary | ICD-10-CM | POA: Diagnosis not present

## 2023-01-28 DIAGNOSIS — Z5986 Financial insecurity: Secondary | ICD-10-CM | POA: Insufficient documentation

## 2023-01-28 DIAGNOSIS — I5043 Acute on chronic combined systolic (congestive) and diastolic (congestive) heart failure: Secondary | ICD-10-CM | POA: Insufficient documentation

## 2023-01-28 DIAGNOSIS — Z79899 Other long term (current) drug therapy: Secondary | ICD-10-CM | POA: Insufficient documentation

## 2023-01-28 DIAGNOSIS — Z7984 Long term (current) use of oral hypoglycemic drugs: Secondary | ICD-10-CM | POA: Diagnosis not present

## 2023-01-28 DIAGNOSIS — I493 Ventricular premature depolarization: Secondary | ICD-10-CM | POA: Diagnosis not present

## 2023-01-28 DIAGNOSIS — I471 Supraventricular tachycardia, unspecified: Secondary | ICD-10-CM | POA: Diagnosis not present

## 2023-01-28 DIAGNOSIS — I272 Pulmonary hypertension, unspecified: Secondary | ICD-10-CM

## 2023-01-28 DIAGNOSIS — R0602 Shortness of breath: Secondary | ICD-10-CM | POA: Diagnosis not present

## 2023-01-28 DIAGNOSIS — I10 Essential (primary) hypertension: Secondary | ICD-10-CM

## 2023-01-28 DIAGNOSIS — I5022 Chronic systolic (congestive) heart failure: Secondary | ICD-10-CM | POA: Diagnosis not present

## 2023-01-28 MED ORDER — CARVEDILOL 3.125 MG PO TABS: 3.1250 mg | ORAL_TABLET | Freq: Two times a day (BID) | ORAL | 8 refills | Status: DC

## 2023-01-28 NOTE — Patient Instructions (Signed)
Thank you for coming in today  Labs were done today, if any labs are abnormal the clinic will call you No news is good news  Medications: START Coreg 3.125 mg 1 tablet twice daily   Follow up appointments:  Your physician recommends that you schedule a follow-up appointment in:  3 months With Dr. Gala Romney with echocardiogram  Your physician has requested that you have an echocardiogram. Echocardiography is a painless test that uses sound waves to create images of your heart. It provides your doctor with information about the size and shape of your heart and how well your heart's chambers and valves are working. This procedure takes approximately one hour. There are no restrictions for this procedure.       Do the following things EVERYDAY: Weigh yourself in the morning before breakfast. Write it down and keep it in a log. Take your medicines as prescribed Eat low salt foods--Limit salt (sodium) to 2000 mg per day.  Stay as active as you can everyday Limit all fluids for the day to less than 2 liters   At the [facility], you and your health needs are our priority. As part of our continuing mission to provide you with exceptional heart care, we have created designated Provider Care Teams. These Care Teams include your primary Cardiologist (physician) and Advanced Practice Providers (APPs- Physician Assistants and Nurse Practitioners) who all work together to provide you with the care you need, when you need it.   You may see any of the following providers on your designated Care Team at your next follow up: Dr Arvilla Meres Dr Marca Ancona Dr. Marcos Eke, NP Robbie Lis, [place] [facility], [place] Brynda Peon, NP Karle Plumber, PharmD   Please be sure to bring in all your medications bottles to every appointment.    Thank you for choosing [facility]  If you have any questions or  concerns before your next appointment please send [place] a message through Ivy or call our office at [phone].    TO LEAVE A MESSAGE FOR THE NURSE SELECT OPTION 2, PLEASE LEAVE A MESSAGE INCLUDING: YOUR NAME DATE OF BIRTH CALL BACK NUMBER REASON FOR CALL**this is important as we prioritize the call backs  YOU WILL RECEIVE A CALL BACK THE SAME DAY AS LONG AS YOU CALL BEFORE 4:00 PM

## 2023-02-02 ENCOUNTER — Encounter (HOSPITAL_COMMUNITY)
Admission: RE | Admit: 2023-02-02 | Discharge: 2023-02-02 | Disposition: A | Discharge status: Home / Self Care | Payer: BLUE CROSS/BLUE SHIELD | Source: Ambulatory Visit | Attending: Internal Medicine | Admitting: Internal Medicine

## 2023-02-02 VITALS — Wt 242.9 lb

## 2023-02-02 DIAGNOSIS — I272 Pulmonary hypertension, unspecified: Secondary | ICD-10-CM

## 2023-02-02 NOTE — Progress Notes (Signed)
Daily Session Note  Patient Details  Name: Claudia [name] MRN: [id number] Date of Birth: [birth year] Referring Provider:   Doristine Devoid Pulmonary Rehab Walk Test from 01/13/2023 in [facility] for Heart, Vascular, & Lung Health  Referring Provider Bensimhon       Encounter Date: 02/02/2023  Check In:  Session Check In - 02/02/23 1445       Check-In   Supervising physician immediately available to respond to emergencies [facility] MD immediately available    Physician(s) Carlos Levering, NP    Location [facility]    Staff Present Essie Hart, RN, BSN;Randi Idelle Crouch BS, ACSM-CEP, Exercise Physiologist;Kaylee Earlene Plater, MS, ACSM-CEP, Exercise Physiologist;Kemani Heidel Sonnie Alamo, MS, ACSM-CEP, Exercise Physiologist;Carlette Les Pou, RN, BSN;Samantha Belarus, RD, LDN    Virtual Visit No    Medication changes reported     No    Fall or balance concerns reported    No    Tobacco Cessation No Change    Warm-up and Cool-down Performed as group-led instruction    Resistance Training Performed Yes    VAD Patient? No    PAD/SET Patient? No      Pain Assessment   Currently in Pain? No/denies    Multiple Pain Sites No             Capillary Blood Glucose: No results found for this or any previous visit (from the past 24 hour(s)).    Social History   Tobacco Use  Smoking Status Never  Smokeless Tobacco Never    Goals Met:  Proper associated with RPD/PD & O2 Sat Independence with exercise equipment Exercise tolerated well No report of concerns or symptoms today Strength training completed today  Goals Unmet:  Not Applicable  Comments: Service time is from 1319 to 1450.    Dr. Mechele Collin is Medical Director for Pulmonary Rehab at [facility].

## 2023-02-04 ENCOUNTER — Encounter (HOSPITAL_COMMUNITY): Payer: BLUE CROSS/BLUE SHIELD

## 2023-02-04 ENCOUNTER — Telehealth (HOSPITAL_COMMUNITY): Payer: Self-pay

## 2023-02-04 NOTE — Telephone Encounter (Signed)
LVM for patient to check on her since she missed Pulmonary Rehab today.

## 2023-02-09 ENCOUNTER — Encounter (HOSPITAL_COMMUNITY): Payer: BLUE CROSS/BLUE SHIELD

## 2023-02-10 NOTE — Progress Notes (Signed)
Pulmonary Individual Treatment Plan  Patient Details  Name: Claudia [name] MRN: [id number] Date of Birth: 25-Jun-1966 Referring Provider:   Doristine Devoid Pulmonary Rehab Walk Test from 01/13/2023 in [facility]  Referring Provider Bensimhon       Initial Encounter Date:  Flowsheet Row Pulmonary Rehab Walk Test from 01/13/2023 in [facility]  Date 01/13/23       Visit Diagnosis: Pulmonary hypertension (HCC)  Patient's Home Medications on Admission:   Current Outpatient Medications:    acetaminophen (TYLENOL) 500 MG tablet, Take 500 mg by mouth every 6 (six) hours as needed for mild pain or moderate pain., Disp: , Rfl:    amLODipine (NORVASC) 5 MG tablet, Take 1 tablet (5 mg total) by mouth daily., Disp: 180 tablet, Rfl: 0   aspirin 81 MG EC tablet, Take 1 tablet (81 mg total) by mouth daily., Disp: 30 tablet, Rfl: 6   blood glucose meter kit and supplies KIT, Dispense based on patient and insurance preference. Use up to four times daily as directed. (FOR ICD-9 250.00, 250.01)., Disp: 1 each, Rfl: 0   Blood Glucose Monitoring Suppl (ACCU-CHEK GUIDE) w/Device KIT, 1 each by Does not apply route in the morning, at noon, in the evening, and at bedtime., Disp: 1 kit, Rfl: 0   carvedilol (COREG) 3.125 MG tablet, Take 1 tablet (3.125 mg total) by mouth 2 (two) times daily with a meal., Disp: 60 tablet, Rfl: 8   dapagliflozin propanediol (FARXIGA) 10 MG TABS tablet, Take 1 tablet (10 mg total) by mouth daily., Disp: 30 tablet, Rfl: 2   ferrous sulfate 325 (65 FE) MG tablet, Take 1 tablet (325 mg total) by mouth daily with breakfast., Disp: 60 tablet, Rfl: 6   furosemide (LASIX) 80 MG tablet, Take 1 tablet (80 mg total) by mouth 2 (two) times daily., Disp: 60 tablet, Rfl: 0   glucose blood (ACCU-CHEK GUIDE) test strip, Use as instructed to check sugar 4 times daily., Disp: 200  each, Rfl: 6   Lancets (ACCU-CHEK MULTICLIX) lancets, Use as instructed to check blood sugar 4 times daily., Disp: 204 each, Rfl: 12   metFORMIN (GLUCOPHAGE) 1000 MG tablet, TAKE 1 TABLET BY MOUTH TWICE DAILY WITH A MEAL, Disp: 180 tablet, Rfl: 1   Omega-3 Fatty Acids (FISH OIL) 1000 MG CAPS, Take 1,000 mg by mouth daily., Disp: , Rfl:    OVER THE COUNTER MEDICATION, Take 1 capsule by mouth daily. Herbal Supplement  -- unknown name, Disp: , Rfl:    potassium chloride SA (KLOR-CON M) 20 MEQ tablet, Take 1 tablet (20 mEq total) by mouth daily., Disp: 90 tablet, Rfl: 3   sacubitril-valsartan (ENTRESTO) 97-103 MG, Take 1 tablet by mouth 2 (two) times daily., Disp: 60 tablet, Rfl: 7   Semaglutide, 2 MG/DOSE, (OZEMPIC, 2 MG/DOSE,) 8 MG/3ML SOPN, Inject 2 mg into the skin once a week., Disp: 3 mL, Rfl: 11   spironolactone (ALDACTONE) 25 MG tablet, Take 1 tablet (25 mg total) by mouth daily., Disp: 90 tablet, Rfl: 3  Past Medical History: Past Medical History:  Diagnosis Date   Diabetes mellitus    Fibroids    Headache(784.0)    History of echocardiogram    Echo 7/18: Moderate concentric LVH, EF 55-60, normal wall motion, grade 1 diastolic dysfunction, calcified aortic leaflets   Hypercholesterolemia    Hyperlipidemia    Hypertension    Osteoarthritis    ,  with mild meniscus tear, followed by orthopedics--[facility] 04/29/10   Right knee pain    Seizures (HCC)    as child   STEMI (ST elevation myocardial infarction) (HCC)    Vitamin D deficiency     Tobacco Use: Social History   Tobacco Use  Smoking Status Never  Smokeless Tobacco Never    Labs: Review Flowsheet  More data exists      Latest Ref Rng & Units 08/12/2022 09/16/2022 12/02/2022 12/03/2022 12/04/2022  Labs for ITP Cardiac and Pulmonary Rehab  Cholestrol 0 - 200 mg/dL - - 161  - -  LDL (calc) 0 - 99 mg/dL - - 096  - -  HDL-C >04 mg/dL - - 41  - -  Trlycerides <150 mg/dL - - 75  - -  Hemoglobin A1c 4.8 - 5.6 % 9.3  - 6.9  - -   PH, Arterial 7.35 - 7.45 - - - 7.45  7.326  7.393  -  PCO2 arterial 32 - 48 mmHg - - - 75  84.3  67.9  -  Bicarbonate 20.0 - 28.0 mmol/L - 32.1  34.2  - 52.8  44.2  41.4  45.6  53.2   TCO2 22 - 32 mmol/L - 34  36  - 47  43  -  O2 Saturation % - 71  70  - 90  96  98  85.6  74     Capillary Blood Glucose: Lab Results  Component Value Date   GLUCAP 110 (H) 01/13/2023   GLUCAP 135 (H) 12/07/2022   GLUCAP 163 (H) 12/07/2022   GLUCAP 113 (H) 12/07/2022   GLUCAP 119 (H) 12/06/2022     Pulmonary Assessment Scores:  Pulmonary Assessment Scores     Row Name 01/13/23 0923         ADL UCSD   ADL Phase Entry     SOB Score total 38       CAT Score   CAT Score 8       mMRC Score   mMRC Score 1             UCSD: Self-administered rating of dyspnea associated with activities of daily living (ADLs) 6-point [name] (0 = "not at all" to 5 = "maximal or unable to do because of breathlessness")  Scoring Scores range from 0 to 120.  Minimally important difference is 5 units  CAT: CAT can identify the health impairment of COPD patients and is better correlated with disease progression.  CAT has a scoring range of zero to 40. The CAT score is classified into four groups of low (less than 10), medium (10 - 20), high (21-30) and very high (31-40) based on the impact level of disease on health status. A CAT score over 10 suggests significant symptoms.  A worsening CAT score could be explained by an exacerbation, poor medication adherence, poor inhaler technique, or progression of COPD or comorbid conditions.  CAT MCID is 2 points  mMRC: mMRC (Modified Medical Research Council) Dyspnea [name] is used to assess the degree of baseline functional disability in patients of respiratory disease due to dyspnea. No minimal important difference is established. A decrease in score of 1 point or greater is considered a positive change.   Pulmonary Function Assessment:  Pulmonary Function Assessment -  01/13/23 0922       Breath   Bilateral Breath Sounds Clear    Shortness of Breath Yes  Exercise Target Goals: Exercise Program Goal: Individual exercise prescription set using results from initial 6 min walk test and THRR while considering  patient's activity barriers and safety.   Exercise Prescription Goal: Initial exercise prescription builds to 30-45 minutes a day of aerobic activity, 2-3 days per week.  Home exercise guidelines will be given to patient during program as part of exercise prescription that the participant will acknowledge.  Activity Barriers & Risk Stratification:  Activity Barriers & Cardiac Risk Stratification - 01/13/23 0921       Activity Barriers & Cardiac Risk Stratification   Activity Barriers Deconditioning;Muscular Weakness;Shortness of Breath;History of Falls;Arthritis             6 Minute Walk:  6 Minute Walk     Row Name 01/13/23 1124         6 Minute Walk   Phase Initial     Distance 650 feet     Walk Time 6 minutes     # of Rest Breaks 1  3:56-4:18     MPH 0.62     METS 2.25     RPE 11     Perceived Dyspnea  0     VO2 Peak 7.87     Symptoms Yes (comment)     Comments Dizziness. Resolved after drinking 8 oz of H2O     Resting HR 94 bpm     Resting BP 105/73     Resting Oxygen Saturation  98 %     Exercise Oxygen Saturation  during 6 min walk 1.51 %     Max Ex. HR 119 bpm     Max Ex. BP 133/88     2 Minute Post BP 127/87       Interval HR   1 Minute HR 109     2 Minute HR 119     3 Minute HR 104     4 Minute HR 93     5 Minute HR 111     6 Minute HR 112     2 Minute Post HR 97     Interval Heart Rate? Yes       Interval Oxygen   Interval Oxygen? Yes     Baseline Oxygen Saturation % 98 %     1 Minute Oxygen Saturation % 99 %     1 Minute Liters of Oxygen 2 L     2 Minute Oxygen Saturation % 98 %     2 Minute Liters of Oxygen 2 L     3 Minute Oxygen Saturation % 99 %     3 Minute Liters of Oxygen 2  L     4 Minute Oxygen Saturation % 96 %     4 Minute Liters of Oxygen 2 L     5 Minute Oxygen Saturation % 98 %     5 Minute Liters of Oxygen 2 L     6 Minute Oxygen Saturation % 99 %     6 Minute Liters of Oxygen 2 L     2 Minute Post Oxygen Saturation % 99 %     2 Minute Post Liters of Oxygen 2 L              Oxygen Initial Assessment:  Oxygen Initial Assessment - 01/13/23 0923       Home Oxygen   Home Oxygen Device Home Concentrator;E-Tanks    Sleep Oxygen Prescription Continuous    Liters per minute 2  Home Exercise Oxygen Prescription Continuous    Liters per minute 2    Home Resting Oxygen Prescription Continuous    Liters per minute 2    Compliance with Home Oxygen Use Yes      Initial 6 min Walk   Oxygen Used Continuous    Liters per minute 2      Program Oxygen Prescription   Program Oxygen Prescription Continuous    Liters per minute 2      Intervention   Short Term Goals To learn and exhibit compliance with exercise, home and travel O2 prescription;To learn and understand importance of maintaining oxygen saturations>88%;To learn and demonstrate proper use of respiratory medications;To learn and understand importance of monitoring SPO2 with pulse oximeter and demonstrate accurate use of the pulse oximeter.;To learn and demonstrate proper pursed lip breathing techniques or other breathing techniques.     Long  Term Goals Exhibits compliance with exercise, home  and travel O2 prescription;Verbalizes importance of monitoring SPO2 with pulse oximeter and return demonstration;Maintenance of O2 saturations>88%;Exhibits proper breathing techniques, such as pursed lip breathing or other method taught during program session;Compliance with respiratory medication;Demonstrates proper use of MDI's             Oxygen Re-Evaluation:  Oxygen Re-Evaluation     Row Name 02/05/23 0921             Program Oxygen Prescription   Program Oxygen Prescription Continuous        Liters per minute 2         Home Oxygen   Home Oxygen Device Home Concentrator;E-Tanks       Sleep Oxygen Prescription Continuous       Liters per minute 2       Home Exercise Oxygen Prescription Continuous       Liters per minute 2       Home Resting Oxygen Prescription Continuous       Liters per minute 2       Compliance with Home Oxygen Use Yes         Goals/Expected Outcomes   Short Term Goals To learn and exhibit compliance with exercise, home and travel O2 prescription;To learn and understand importance of maintaining oxygen saturations>88%;To learn and demonstrate proper use of respiratory medications;To learn and understand importance of monitoring SPO2 with pulse oximeter and demonstrate accurate use of the pulse oximeter.;To learn and demonstrate proper pursed lip breathing techniques or other breathing techniques.        Long  Term Goals Exhibits compliance with exercise, home  and travel O2 prescription;Verbalizes importance of monitoring SPO2 with pulse oximeter and return demonstration;Maintenance of O2 saturations>88%;Exhibits proper breathing techniques, such as pursed lip breathing or other method taught during program session;Compliance with respiratory medication;Demonstrates proper use of MDI's       Goals/Expected Outcomes Compliance and understanding of oxygen saturations monitoring and breathing techniques to decrease shortness of breath.                Oxygen Discharge (Final Oxygen Re-Evaluation):  Oxygen Re-Evaluation - 02/05/23 0921       Program Oxygen Prescription   Program Oxygen Prescription Continuous    Liters per minute 2      Home Oxygen   Home Oxygen Device Home Concentrator;E-Tanks    Sleep Oxygen Prescription Continuous    Liters per minute 2    Home Exercise Oxygen Prescription Continuous    Liters per minute 2    Home Resting Oxygen Prescription Continuous  Liters per minute 2    Compliance with Home Oxygen Use Yes       Goals/Expected Outcomes   Short Term Goals To learn and exhibit compliance with exercise, home and travel O2 prescription;To learn and understand importance of maintaining oxygen saturations>88%;To learn and demonstrate proper use of respiratory medications;To learn and understand importance of monitoring SPO2 with pulse oximeter and demonstrate accurate use of the pulse oximeter.;To learn and demonstrate proper pursed lip breathing techniques or other breathing techniques.     Long  Term Goals Exhibits compliance with exercise, home  and travel O2 prescription;Verbalizes importance of monitoring SPO2 with pulse oximeter and return demonstration;Maintenance of O2 saturations>88%;Exhibits proper breathing techniques, such as pursed lip breathing or other method taught during program session;Compliance with respiratory medication;Demonstrates proper use of MDI's    Goals/Expected Outcomes Compliance and understanding of oxygen saturations monitoring and breathing techniques to decrease shortness of breath.             Initial Exercise Prescription:  Initial Exercise Prescription - 01/13/23 1100       Date of Initial Exercise RX and Referring Provider   Date 01/13/23    Referring Provider Bensimhon    Expected Discharge Date 04/08/23      Oxygen   Oxygen Continuous    Liters 2    Maintain Oxygen Saturation 88% or higher      Recumbant Bike   Level 1    Watts 20    Minutes 15    METs 1.5      NuStep   Level 1    SPM 60    Minutes 15    METs 1      Prescription Details   Frequency (times per week) 2    Duration Progress to 30 minutes of continuous aerobic without signs/symptoms of physical distress      Intensity   THRR 40-80% of Max Heartrate 66-132    Ratings of Perceived Exertion 11-13    Perceived Dyspnea 0-4      Progression   Progression Continue to progress workloads to maintain intensity without signs/symptoms of physical distress.      Resistance Training    Training Prescription Yes    Weight red bands    Reps 10-15             Perform Capillary Blood Glucose checks as needed.  Exercise Prescription Changes:   Exercise Prescription Changes     Row Name 01/26/23 1500 02/09/23 1500           Response to Exercise   Blood Pressure (Admit) 120/80 104/74      Blood Pressure (Exercise) 134/80 --      Blood Pressure (Exit) 118/74 118/80      Heart Rate (Admit) 94 bpm 96 bpm      Heart Rate (Exercise) 101 bpm 119 bpm      Heart Rate (Exit) 89 bpm 106 bpm      Oxygen Saturation (Admit) 97 % 98 %      Oxygen Saturation (Exercise) 99 % 98 %      Oxygen Saturation (Exit) 98 % 96 %      Rating of Perceived Exertion (Exercise) 11 9      Perceived Dyspnea (Exercise) 0 1      Duration Continue with 30 min of aerobic exercise without signs/symptoms of physical distress. Continue with 30 min of aerobic exercise without signs/symptoms of physical distress.      Intensity THRR unchanged THRR unchanged  Progression   Progression -- Continue to progress workloads to maintain intensity without signs/symptoms of physical distress.        Resistance Training   Training Prescription Yes Yes      Weight red bands red bands      Reps 10-15 10-15      Time 10 Minutes 10 Minutes        Oxygen   Oxygen Continuous Continuous      Liters 2 2        NuStep   Level 2 2      SPM -- 60      Minutes 15 15      METs 2.1 2.3        Track   Laps 12 14      Minutes 15 15      METs 2.85 3.15        Oxygen   Maintain Oxygen Saturation 88% or higher 88% or higher               Exercise Comments:   Exercise Comments     Row Name 01/19/23 1532           Exercise Comments Pt completed first day of exercise. Claudia [name] exercised for 30 min on the Nustep at level 1. Claudia [name] averaged 1.8 METs on the Nustep. She felt that the Nustep was somewhat easy. Will progress her to track walking for 15 min. Claudia [name] performed the warmup and cooldown  standing without limitations. Discussed METs and how to increase METs.                Exercise Goals and Review:   Exercise Goals     Row Name 01/13/23 0920 02/05/23 0916           Exercise Goals   Increase Physical Activity Yes Yes      Intervention Provide advice, education, support and counseling about physical activity/exercise needs.;Develop an individualized exercise prescription for aerobic and resistive training based on initial evaluation findings, risk stratification, comorbidities and participant's personal goals. Provide advice, education, support and counseling about physical activity/exercise needs.;Develop an individualized exercise prescription for aerobic and resistive training based on initial evaluation findings, risk stratification, comorbidities and participant's personal goals.      Expected Outcomes Short Term: Attend rehab on a regular basis to increase amount of physical activity.;Long Term: Add in home exercise to make exercise part of routine and to increase amount of physical activity.;Long Term: Exercising regularly at least 3-5 days a week. Short Term: Attend rehab on a regular basis to increase amount of physical activity.;Long Term: Add in home exercise to make exercise part of routine and to increase amount of physical activity.;Long Term: Exercising regularly at least 3-5 days a week.      Increase Strength and Stamina Yes Yes      Intervention Provide advice, education, support and counseling about physical activity/exercise needs.;Develop an individualized exercise prescription for aerobic and resistive training based on initial evaluation findings, risk stratification, comorbidities and participant's personal goals. Provide advice, education, support and counseling about physical activity/exercise needs.;Develop an individualized exercise prescription for aerobic and resistive training based on initial evaluation findings, risk stratification, comorbidities  and participant's personal goals.      Expected Outcomes Short Term: Increase workloads from initial exercise prescription for resistance, speed, and METs.;Short Term: Perform resistance training exercises routinely during rehab and add in resistance training at home;Long Term: Improve cardiorespiratory fitness, muscular endurance and strength as measured by increased  METs and functional capacity ( ) Short Term: Increase workloads from initial exercise prescription for resistance, speed, and METs.;Short Term: Perform resistance training exercises routinely during rehab and add in resistance training at home;Long Term: Improve cardiorespiratory fitness, muscular endurance and strength as measured by increased METs and functional capacity ( )      Able to understand and use rate of perceived exertion (RPE) [name] Yes Yes      Intervention Provide education and explanation on how to use RPE [name] Provide education and explanation on how to use RPE [name]      Expected Outcomes Short Term: Able to use RPE daily in rehab to express subjective intensity level;Long Term:  Able to use RPE to guide intensity level when exercising independently Short Term: Able to use RPE daily in rehab to express subjective intensity level;Long Term:  Able to use RPE to guide intensity level when exercising independently      Able to understand and use Dyspnea [name] Yes Yes      Intervention Provide education and explanation on how to use Dyspnea [name] Provide education and explanation on how to use Dyspnea [name]      Expected Outcomes Short Term: Able to use Dyspnea [name] daily in rehab to express subjective sense of shortness of breath during exertion;Long Term: Able to use Dyspnea [name] to guide intensity level when exercising independently Short Term: Able to use Dyspnea [name] daily in rehab to express subjective sense of shortness of breath during exertion;Long Term: Able to use Dyspnea [name] to guide intensity level when  exercising independently      Knowledge and understanding of Target Heart Rate Range (THRR) Yes Yes      Intervention Provide education and explanation of THRR including how the numbers were predicted and where they are located for reference Provide education and explanation of THRR including how the numbers were predicted and where they are located for reference      Expected Outcomes Short Term: Able to state/look up THRR;Long Term: Able to use THRR to govern intensity when exercising independently;Short Term: Able to use daily as guideline for intensity in rehab Short Term: Able to state/look up THRR;Long Term: Able to use THRR to govern intensity when exercising independently;Short Term: Able to use daily as guideline for intensity in rehab      Understanding of Exercise Prescription Yes Yes      Intervention Provide education, explanation, and written materials on patient's individual exercise prescription Provide education, explanation, and written materials on patient's individual exercise prescription      Expected Outcomes Short Term: Able to explain program exercise prescription;Long Term: Able to explain home exercise prescription to exercise independently Short Term: Able to explain program exercise prescription;Long Term: Able to explain home exercise prescription to exercise independently               Exercise Goals Re-Evaluation :  Exercise Goals Re-Evaluation     Row Name 02/05/23 0916             Exercise Goal Re-Evaluation   Exercise Goals Review Increase Physical Activity;Able to understand and use Dyspnea [name];Understanding of Exercise Prescription;Increase Strength and Stamina;Knowledge and understanding of Target Heart Rate Range (THRR);Able to understand and use rate of perceived exertion (RPE) [name]       Comments Pt has completed 4 days of exercise. she missed the last session. She is exercising on the recumbent stepper for 15 min then walking the track for 15 min  now. She is on  level 2, 2.3 METs on the stepper and 3.15 METs on the track with the walker. She is tolerating well, progressing. Claudia [name] performed the warmup and cooldown standing without limitations. Will progress as tolerated.       Expected Outcomes Through rehab and exercising at home, patient will find it easier to carry out ADL's and will gain the confidence to establish an exercise routine at home.                Discharge Exercise Prescription (Final Exercise Prescription Changes):  Exercise Prescription Changes - 02/09/23 1500       Response to Exercise   Blood Pressure (Admit) 104/74    Blood Pressure (Exit) 118/80    Heart Rate (Admit) 96 bpm    Heart Rate (Exercise) 119 bpm    Heart Rate (Exit) 106 bpm    Oxygen Saturation (Admit) 98 %    Oxygen Saturation (Exercise) 98 %    Oxygen Saturation (Exit) 96 %    Rating of Perceived Exertion (Exercise) 9    Perceived Dyspnea (Exercise) 1    Duration Continue with 30 min of aerobic exercise without signs/symptoms of physical distress.    Intensity THRR unchanged      Progression   Progression Continue to progress workloads to maintain intensity without signs/symptoms of physical distress.      Resistance Training   Training Prescription Yes    Weight red bands    Reps 10-15    Time 10 Minutes      Oxygen   Oxygen Continuous    Liters 2      NuStep   Level 2    SPM 60    Minutes 15    METs 2.3      Track   Laps 14    Minutes 15    METs 3.15      Oxygen   Maintain Oxygen Saturation 88% or higher             Nutrition:  Target Goals: Understanding of nutrition guidelines, daily intake of sodium 1500mg , cholesterol 200mg , calories 30% from fat and 7% or less from saturated fats, daily to have 5 or more servings of fruits and vegetables.  Biometrics:    Nutrition Therapy Plan and Nutrition Goals:  Nutrition Therapy & Goals - 01/19/23 1438       Nutrition Therapy   Diet Heart  Healthy/Carbohydrate Consistent diet      Personal Nutrition Goals   Nutrition Goal Patient to improve diet quality by using the plate method as a guide for meal planning to include lean protein/plant protein, fruits, vegetables, whole grains, nonfat dairy as part of a well-balanced diet.    Personal Goal #2 Patient to limit sodium intake 2300mg  per day.    Personal Goal #3 Patient to identify strategies for weight loss of 0.5-2.0# per week.    Comments Claudia [name] has medical history of CHF, pulmonary HTN, aortic athersclerosis, DM2. Per documentation on 12/11/22, she is unable to take statins and declined zetia. She reports that she is motivated to lose weight and improve blood sugar control; she continues ozempic. She monitors weight, fluid intake, and sodium intake daily. Claudia [name] will benefit from participation in pulmonary rehab for nutrition, exercise, and lifestyle modification.      Intervention Plan   Intervention Prescribe, educate and counsel regarding individualized specific dietary modifications aiming towards targeted core components such as weight, hypertension, lipid management, diabetes, heart failure and other comorbidities.;Nutrition handout(s) given to patient.  Expected Outcomes Short Term Goal: Understand basic principles of dietary content, such as calories, fat, sodium, cholesterol and nutrients.;Long Term Goal: Adherence to prescribed nutrition plan.             Nutrition Assessments:  MEDIFICTS Score Key: ?70 Need to make dietary changes  40-70 Heart Healthy Diet ? 40 Therapeutic Level Cholesterol Diet   Picture Your Plate Scores: <16 Unhealthy dietary pattern with much room for improvement. 41-50 Dietary pattern unlikely to meet recommendations for good health and room for improvement. 51-60 More healthful dietary pattern, with some room for improvement.  >60 Healthy dietary pattern, although there may be some specific behaviors that could be improved.     Nutrition Goals Re-Evaluation:  Nutrition Goals Re-Evaluation     Row Name 01/19/23 1438             Goals   Current Weight 242 lb 15.2 oz (110.2 kg)       Comment A1c 6.9, LDL 140       Expected Outcome [place] has medical history of CHF, pulmonary HTN, aortic athersclerosis, DM2. Per documentation on 12/11/22, she is unable to take statins and declined zetia. She reports that she is motivated to lose weight and improve blood sugar control; she continues ozempic. She monitors weight, fluid intake, and sodium intake daily. Claudia [name] will benefit from participation in pulmonary rehab for nutrition, exercise, and lifestyle modification.                Nutrition Goals Discharge (Final Nutrition Goals Re-Evaluation):  Nutrition Goals Re-Evaluation - 01/19/23 1438       Goals   Current Weight 242 lb 15.2 oz (110.2 kg)    Comment A1c 6.9, LDL 140    Expected Outcome Claudia [name] has medical history of CHF, pulmonary HTN, aortic athersclerosis, DM2. Per documentation on 12/11/22, she is unable to take statins and declined zetia. She reports that she is motivated to lose weight and improve blood sugar control; she continues ozempic. She monitors weight, fluid intake, and sodium intake daily. Claudia [name] will benefit from participation in pulmonary rehab for nutrition, exercise, and lifestyle modification.             Psychosocial: Target Goals: Acknowledge presence or absence of significant depression and/or stress, maximize coping skills, provide positive support system. Participant is able to verbalize types and ability to use techniques and skills needed for reducing stress and depression.  Initial Review & Psychosocial Screening:  Initial Psych Review & Screening - 01/13/23 0949       Initial Review   Current issues with Current Stress Concerns    Source of Stress Concerns Chronic Illness;Retirement/disability    Comments Pt is currently waiting disability. Offered referral to therapy  and/or medications as pt declined. Pt feels she has a good support system to help alleviate some stress.      Family Dynamics   Good Support System? Yes    Comments spouse and children      Barriers   Psychosocial barriers to participate in program The patient should benefit from training in stress management and relaxation.      Screening Interventions   Interventions Encouraged to exercise;To provide support and resources with identified psychosocial needs    Expected Outcomes Short Term goal: Utilizing psychosocial counselor, staff and physician to assist with identification of specific Stressors or current issues interfering with healing process. Setting desired goal for each stressor or current issue identified.;Long Term Goal: Stressors or current issues are controlled or eliminated.;Short  Term goal: Identification and review with participant of any Quality of Life or Depression concerns found by scoring the questionnaire.;Long Term goal: The participant improves quality of Life and PHQ9 Scores as seen by post scores and/or verbalization of changes             Quality of Life Scores:  Scores of 19 and below usually indicate a poorer quality of life in these areas.  A difference of  2-3 points is a clinically meaningful difference.  A difference of 2-3 points in the total score of the Quality of Life Index has been associated with significant improvement in overall quality of life, self-image, physical symptoms, and general health in studies assessing change in quality of life.  PHQ-9: Review Flowsheet  More data exists      01/13/2023 12/11/2022 02/03/2022 08/25/2021 01/01/2020  Depression screen PHQ 2/9  Decreased Interest 0 0 0 0 0  Down, Depressed, Hopeless 1 0 0 0 0  PHQ - 2 Score 1 0 0 0 0  Altered sleeping 1 - - - -  Tired, decreased energy 1 - - - -  Change in appetite 1 - - - -  Feeling bad or failure about yourself  1 - - - -  Trouble concentrating 0 - - - -  Moving  slowly or fidgety/restless 0 - - - -  Suicidal thoughts 0 - - - -  PHQ-9 Score 5 - - - -  Difficult doing work/chores Somewhat difficult - - - -   Interpretation of Total Score  Total Score Depression Severity:  1-4 = Minimal depression, 5-9 = Mild depression, 10-14 = Moderate depression, 15-19 = Moderately severe depression, 20-27 = Severe depression   Psychosocial Evaluation and Intervention:  Psychosocial Evaluation - 01/13/23 0954       Psychosocial Evaluation & Interventions   Interventions Stress management education;Relaxation education;Encouraged to exercise with the program and follow exercise prescription    Comments Pt is stressed waiting on disability. She is remain positive throughout this stressfull situation.    Expected Outcomes For [place] to participate in rehab free of psychosocial barriers.    Continue Psychosocial Services  Follow up required by staff             Psychosocial Re-Evaluation:  Psychosocial Re-Evaluation     Row Name 02/04/23 1557             Psychosocial Re-Evaluation   Current issues with Current Stress Concerns       Comments Claudia [name] is still waiting on disability and continues to be stressed by this. She is doing well with managing her stress.       Expected Outcomes For Claudia [name] to participate in PR free of any psychosocial barriers or concerns       Interventions Encouraged to attend Pulmonary Rehabilitation for the exercise       Continue Psychosocial Services  Follow up required by staff                Psychosocial Discharge (Final Psychosocial Re-Evaluation):  Psychosocial Re-Evaluation - 02/04/23 1557       Psychosocial Re-Evaluation   Current issues with Current Stress Concerns    Comments Claudia [name] is still waiting on disability and continues to be stressed by this. She is doing well with managing her stress.    Expected Outcomes For Claudia [name] to participate in PR free of any psychosocial barriers or concerns    Interventions  Encouraged to attend Pulmonary Rehabilitation for the exercise  Continue Psychosocial Services  Follow up required by staff             Education: Education Goals: Education classes will be provided on a weekly basis, covering required topics. Participant will state understanding/return demonstration of topics presented.  Learning Barriers/Preferences:  Learning Barriers/Preferences - 01/13/23 0934       Learning Barriers/Preferences   Learning Barriers None    Learning Preferences None             Education Topics: Introduction to Pulmonary Rehab Group instruction provided by PowerPoint, verbal discussion, and written material to support subject matter. Instructor reviews what Pulmonary Rehab is, the purpose of the program, and how patients are referred.     Know Your Numbers Group instruction that is supported by a PowerPoint presentation. Instructor discusses importance of knowing and understanding resting, exercise, and post-exercise oxygen saturation, heart rate, and blood pressure. Oxygen saturation, heart rate, blood pressure, rating of perceived exertion, and dyspnea are reviewed along with a normal range for these values.    Exercise for the Pulmonary Patient Group instruction that is supported by a PowerPoint presentation. Instructor discusses benefits of exercise, core components of exercise, frequency, duration, and intensity of an exercise routine, importance of utilizing pulse oximetry during exercise, safety while exercising, and options of places to exercise outside of rehab.       MET Level  Group instruction provided by PowerPoint, verbal discussion, and written material to support subject matter. Instructor reviews what METs are and how to increase METs.  Flowsheet Row PULMONARY REHAB OTHER RESPIRATORY from 01/19/2023 in [facility]  Date 01/19/23  Educator EP  Instruction Review Code 1-  Verbalizes Understanding       Pulmonary Medications Verbally interactive group education provided by instructor with focus on inhaled medications and proper administration.   Anatomy and Physiology of the Respiratory System Group instruction provided by PowerPoint, verbal discussion, and written material to support subject matter. Instructor reviews respiratory cycle and anatomical components of the respiratory system and their functions. Instructor also reviews differences in obstructive and restrictive respiratory diseases with examples of each.    Oxygen Safety Group instruction provided by PowerPoint, verbal discussion, and written material to support subject matter. There is an overview of "What is Oxygen" and "Why do we need it".  Instructor also reviews how to create a safe environment for oxygen use, the importance of using oxygen as prescribed, and the risks of noncompliance. There is a brief discussion on traveling with oxygen and resources the patient may utilize.   Oxygen Use Group instruction provided by PowerPoint, verbal discussion, and written material to discuss how supplemental oxygen is prescribed and different types of oxygen supply systems. Resources for more information are provided.  Flowsheet Row PULMONARY REHAB OTHER RESPIRATORY from 01/21/2023 in [facility]  Date 01/21/23  Educator RT  Instruction Review Code 1- Verbalizes Understanding       Breathing Techniques Group instruction that is supported by demonstration and informational handouts. Instructor discusses the benefits of pursed lip and diaphragmatic breathing and detailed demonstration on how to perform both.     Risk Factor Reduction Group instruction that is supported by a PowerPoint presentation. Instructor discusses the definition of a risk factor, different risk factors for pulmonary disease, and how the heart and lungs work  together.   MD Day A group question and answer session with a medical  doctor that allows participants to ask questions that relate to their pulmonary disease state.   Nutrition for the Pulmonary Patient Group instruction provided by PowerPoint slides, verbal discussion, and written materials to support subject matter. The instructor gives an explanation and review of healthy diet recommendations, which includes a discussion on weight management, recommendations for fruit and vegetable consumption, as well as protein, fluid, caffeine, fiber, sodium, sugar, and alcohol. Tips for eating when patients are short of breath are discussed.    Other Education Group or individual verbal, written, or video instructions that support the educational goals of the pulmonary rehab program.    Knowledge Questionnaire Score:  Knowledge Questionnaire Score - 01/13/23 0934       Knowledge Questionnaire Score   Pre Score 12/18             Core Components/Risk Factors/Patient Goals at Admission:  Personal Goals and Risk Factors at Admission - 01/13/23 0926       Core Components/Risk Factors/Patient Goals on Admission    Weight Management Yes;Weight Loss    Intervention Weight Management: Develop a combined nutrition and exercise program designed to reach desired caloric intake, while maintaining appropriate intake of nutrient and fiber, sodium and fats, and appropriate energy expenditure required for the weight goal.;Weight Management: Provide education and appropriate resources to help participant work on and attain dietary goals.;Weight Management/Obesity: Establish reasonable short term and long term weight goals.;Obesity: Provide education and appropriate resources to help participant work on and attain dietary goals.    Expected Outcomes Short Term: Continue to assess and modify interventions until short term weight is achieved;Long Term: Adherence to nutrition and physical activity/exercise  program aimed toward attainment of established weight goal;Weight Maintenance: Understanding of the daily nutrition guidelines, which includes 25-35% calories from fat, 7% or less cal from saturated fats, less than 200mg  cholesterol, less than 1.5gm of sodium, & 5 or more servings of fruits and vegetables daily;Weight Loss: Understanding of general recommendations for a balanced deficit meal plan, which promotes 1-2 lb weight loss per week and includes a negative energy balance of [phone] kcal/d;Understanding recommendations for meals to include 15-35% energy as protein, 25-35% energy from fat, 35-60% energy from carbohydrates, less than 200mg  of dietary cholesterol, 20-35 gm of total fiber daily;Understanding of distribution of calorie intake throughout the day with the consumption of 4-5 meals/snacks    Improve shortness of breath with ADL's Yes    Intervention Provide education, individualized exercise plan and daily activity instruction to help decrease symptoms of SOB with activities of daily living.    Expected Outcomes Short Term: Improve cardiorespiratory fitness to achieve a reduction of symptoms when performing ADLs;Long Term: Be able to perform more ADLs without symptoms or delay the onset of symptoms             Core Components/Risk Factors/Patient Goals Review:   Goals and Risk Factor Review     Row Name 02/04/23 1601             Core Components/Risk Factors/Patient Goals Review   Personal Goals Review Weight Management/Obesity;Improve shortness of breath with ADL's;Develop more efficient breathing techniques such as purse lipped breathing and diaphragmatic breathing and practicing self-pacing with activity.;Diabetes       Review Pt is doing well in PR. She is very pleasant and enjoy's coming to class. She uses purse lip breathing when she gets SOB. She is not currently on any respiratory medications. She has not met her weight loss goals yet, but is working  with the staff  dietician to meet this goal. She is currently working with staff dietician on how to better control her blood sugar. Claudia [name] is able to report her perceived exertion and dyspnea scal when asked by staff.       Expected Outcomes See admission goals                Core Components/Risk Factors/Patient Goals at Discharge (Final Review):   Goals and Risk Factor Review - 02/04/23 1601       Core Components/Risk Factors/Patient Goals Review   Personal Goals Review Weight Management/Obesity;Improve shortness of breath with ADL's;Develop more efficient breathing techniques such as purse lipped breathing and diaphragmatic breathing and practicing self-pacing with activity.;Diabetes    Review Pt is doing well in PR. She is very pleasant and enjoy's coming to class. She uses purse lip breathing when she gets SOB. She is not currently on any respiratory medications. She has not met her weight loss goals yet, but is working with the staff dietician to meet this goal. She is currently working with staff dietician on how to better control her blood sugar. Claudia [name] is able to report her perceived exertion and dyspnea scal when asked by staff.    Expected Outcomes See admission goals             ITP Comments:Pt is making expected progress toward Pulmonary Rehab goals after completing 4 sessions. Recommend continued exercise, life style modification, education, and utilization of breathing techniques to increase stamina and strength, while also decreasing shortness of breath with exertion.  Dr. Mechele Collin is Medical Director for Pulmonary Rehab at [facility].     Comments: Dr. Mechele Collin is Medical Director for Pulmonary Rehab at [facility].

## 2023-02-11 ENCOUNTER — Telehealth: Payer: Self-pay

## 2023-02-11 ENCOUNTER — Encounter (HOSPITAL_COMMUNITY): Payer: BLUE CROSS/BLUE SHIELD

## 2023-02-11 NOTE — Telephone Encounter (Signed)
Per Dr. Mayford Knife, patient needs Echo. LMTCB.

## 2023-02-11 NOTE — Telephone Encounter (Signed)
-----   Message from Reesa Chew, CMA sent at 02/09/2023  6:27 PM EDT ----- Regarding: SEE ECHO  NOTE  Please repeat an echo and note on 4 L of O2 with CPAP nightly   ----- Message ----- From: Quintella Reichert, MD Sent: 02/05/2023  10:02 AM EDT To: Reesa Chew, CMA  I reviewed sleep CPAP titration and she was titrated on 4 L of O2 during her CPAP titration and that is what I ordered as an outpatient so not sure how she got down to 2 L.  I think her primary put her on 2 L during the day but at night she needs to bump it up to 4 L with her CPAP.  Please repeat an echo and note on 4 L of O2 with CPAP nightly ----- Message ----- From: Reesa Chew, CMA Sent: 02/04/2023   6:59 PM EDT To: Quintella Reichert, MD   ----- Message ----- From: Luellen Pucker, RN Sent: 02/03/2023   5:41 PM EDT To: Reesa Chew, CMA  I think she may have sent this ot me when she meant to send to you, Alcario Drought ----- Message ----- From: Quintella Reichert, MD Sent: 02/03/2023   9:28 AM EDT To: Luellen Pucker, RN  Patient has some nocturnal hypoxemia still on CPAP and 4 L O2 by only about 10 minutes or so we will keep on current oxygen settings. ----- Message ----- From: Reesa Chew, CMA Sent: 02/01/2023   5:54 PM EDT To: Quintella Reichert, MD  Patient returned the call and said yes she hooked the oxygen into her cpap and she was on 2L.

## 2023-02-12 NOTE — Telephone Encounter (Signed)
Patient stated she is on continuous oxygen she is on 3L of oxygen. Explained Dr. Norris Cross :  I reviewed sleep CPAP titration and she was titrated on 4 L of O2 during her CPAP titration and that is what I ordered as an outpatient so not sure how she got down to 2 L. I think her primary put her on 2 L during the day but at night she needs to bump it up to 4 L with her CPAP. Please repeat an echo and note on 4 L of O2 with CPAP nightly   Advised pt she is scheduled for echocardiogram on 05/17/23. Patient voiced understanding.

## 2023-02-12 NOTE — Telephone Encounter (Signed)
Patient returned RN's call.  Patient noted she has Echo scheduled on 8/19.

## 2023-02-15 DIAGNOSIS — G4733 Obstructive sleep apnea (adult) (pediatric): Secondary | ICD-10-CM | POA: Diagnosis not present

## 2023-02-15 DIAGNOSIS — I10 Essential (primary) hypertension: Secondary | ICD-10-CM | POA: Diagnosis not present

## 2023-02-16 ENCOUNTER — Encounter (HOSPITAL_COMMUNITY)
Admission: RE | Admit: 2023-02-16 | Discharge: 2023-02-16 | Disposition: A | Discharge status: Home / Self Care | Payer: BLUE CROSS/BLUE SHIELD | Source: Ambulatory Visit | Attending: Internal Medicine | Admitting: Internal Medicine

## 2023-02-16 DIAGNOSIS — I272 Pulmonary hypertension, unspecified: Secondary | ICD-10-CM | POA: Diagnosis not present

## 2023-02-17 ENCOUNTER — Telehealth: Payer: Self-pay

## 2023-02-17 NOTE — Telephone Encounter (Signed)
-----   Message from Dorothea G Jones, CMA sent at 02/09/2023  6:27 PM EDT ----- Regarding: SEE ECHO  NOTE  Please repeat an echo and note on 4 L of O2 with CPAP nightly   ----- Message ----- From: Turner, Traci R, MD Sent: 02/05/2023  10:02 AM EDT To: Dorothea G Jones, CMA  I reviewed sleep CPAP titration and she was titrated on 4 L of O2 during her CPAP titration and that is what I ordered as an outpatient so not sure how she got down to 2 L.  I think her primary put her on 2 L during the day but at night she needs to bump it up to 4 L with her CPAP.  Please repeat an echo and note on 4 L of O2 with CPAP nightly ----- Message ----- From: Jones, Dorothea G, CMA Sent: 02/04/2023   6:59 PM EDT To: Traci R Turner, MD   ----- Message ----- From: Kyce Ging L, RN Sent: 02/03/2023   5:41 PM EDT To: Dorothea G Jones, CMA  I think she may have sent this ot me when she meant to send to you, Ambriella Kitt ----- Message ----- From: Turner, Traci R, MD Sent: 02/03/2023   9:28 AM EDT To: Meko Masterson L Rhylin Venters, RN  Patient has some nocturnal hypoxemia still on CPAP and 4 L O2 by only about 10 minutes or so we will keep on current oxygen settings. ----- Message ----- From: Jones, Dorothea G, CMA Sent: 02/01/2023   5:54 PM EDT To: Traci R Turner, MD  Patient returned the call and said yes she hooked the oxygen into her cpap and she was on 2L.      

## 2023-02-17 NOTE — Telephone Encounter (Signed)
Called and spoke with patient about scheduling a repeat echo. Patient states she already has a repeat echo scheduled for 05/17/23. Verified that patient is on 4L continuous O2, which she also uses with her cpap machine at night. Updated Echo orders to reflect this.

## 2023-02-17 NOTE — Telephone Encounter (Signed)
-----   Message from Dorothea G Jones, CMA sent at 02/09/2023  6:27 PM EDT ----- Regarding: SEE ECHO  NOTE  Please repeat an echo and note on 4 L of O2 with CPAP nightly   ----- Message ----- From: Turner, Traci R, MD Sent: 02/05/2023  10:02 AM EDT To: Dorothea G Jones, CMA  I reviewed sleep CPAP titration and she was titrated on 4 L of O2 during her CPAP titration and that is what I ordered as an outpatient so not sure how she got down to 2 L.  I think her primary put her on 2 L during the day but at night she needs to bump it up to 4 L with her CPAP.  Please repeat an echo and note on 4 L of O2 with CPAP nightly ----- Message ----- From: Jones, Dorothea G, CMA Sent: 02/04/2023   6:59 PM EDT To: Traci R Turner, MD   ----- Message ----- From: Feiga Nadel L, RN Sent: 02/03/2023   5:41 PM EDT To: Dorothea G Jones, CMA  I think she may have sent this ot me when she meant to send to you, Emigdio Wildeman ----- Message ----- From: Turner, Traci R, MD Sent: 02/03/2023   9:28 AM EDT To: Azure Budnick L Ludene Stokke, RN  Patient has some nocturnal hypoxemia still on CPAP and 4 L O2 by only about 10 minutes or so we will keep on current oxygen settings. ----- Message ----- From: Jones, Dorothea G, CMA Sent: 02/01/2023   5:54 PM EDT To: Traci R Turner, MD  Patient returned the call and said yes she hooked the oxygen into her cpap and she was on 2L.      

## 2023-02-17 NOTE — Telephone Encounter (Signed)
Called and left message per DPR for patient to call our office for Echo results.

## 2023-02-18 ENCOUNTER — Encounter (HOSPITAL_COMMUNITY)
Admission: RE | Admit: 2023-02-18 | Discharge: 2023-02-18 | Disposition: A | Discharge status: Home / Self Care | Payer: BLUE CROSS/BLUE SHIELD | Source: Ambulatory Visit | Attending: Internal Medicine | Admitting: Internal Medicine

## 2023-02-18 DIAGNOSIS — I272 Pulmonary hypertension, unspecified: Secondary | ICD-10-CM | POA: Diagnosis not present

## 2023-02-18 NOTE — Progress Notes (Signed)
Daily Session Note  Patient Details  Name: Claudia [name] MRN: [id number] Date of Birth: Mar 13, 1966 Referring Provider:   Doristine Devoid Pulmonary Rehab Walk Test from 01/13/2023 in [facility]  Referring Provider Bensimhon       Encounter Date: 02/16/2023  Check In:   Capillary Blood Glucose: No results found for this or any previous visit (from the past 24 hour(s)).    Social History   Tobacco Use  Smoking Status Never  Smokeless Tobacco Never    Goals Met:  Independence with exercise equipment Exercise tolerated well No report of concerns or symptoms today Strength training completed today  Goals Unmet:  Not Applicable  Comments: Service time is from 1315 to 1450    Dr. Mechele Collin is Medical Director for Pulmonary Rehab at [facility].

## 2023-02-18 NOTE — Progress Notes (Signed)
Daily Session Note  Patient Details  Name: Claudia [name] MRN: [id number] Date of Birth: 22-Oct-1965 Referring Provider:   Doristine Devoid Pulmonary Rehab Walk Test from 01/13/2023 in [facility], Vascular, & Lung Health  Referring Provider Bensimhon       Encounter Date: 02/18/2023  Check In:  Session Check In - 02/18/23 1432       Check-In   Supervising physician immediately available to respond to emergencies [facility] MD immediately available    Physician(s) Robin Searing, NP    Location [facility]    Staff Present Essie Hart, RN, BSN;Randi Idelle Crouch BS, ACSM-CEP, Exercise Physiologist;Kaylee Earlene Plater, MS, ACSM-CEP, Exercise Physiologist;Ned Kakar Katrinka Blazing, RT    Virtual Visit No    Medication changes reported     No    Fall or balance concerns reported    No    Tobacco Cessation No Change    Warm-up and Cool-down Performed as group-led instruction    Resistance Training Performed Yes    VAD Patient? No    PAD/SET Patient? No      Pain Assessment   Currently in Pain? No/denies    Multiple Pain Sites No             Capillary Blood Glucose: No results found for this or any previous visit (from the past 24 hour(s)).    Social History   Tobacco Use  Smoking Status Never  Smokeless Tobacco Never    Goals Met:  Proper associated with RPD/PD & O2 Sat Independence with exercise equipment Exercise tolerated well No report of concerns or symptoms today Strength training completed today  Goals Unmet:  Not Applicable  Comments: Service time is from 1312 to 1437.    Dr. Mechele Collin is Medical Director for Pulmonary Rehab at [facility].

## 2023-02-23 ENCOUNTER — Encounter (HOSPITAL_COMMUNITY): Payer: BLUE CROSS/BLUE SHIELD

## 2023-02-24 ENCOUNTER — Other Ambulatory Visit: Payer: Self-pay | Admitting: Nurse Practitioner

## 2023-02-24 NOTE — Telephone Encounter (Signed)
Caller & Relationship to patient:  MRN #  [id number]   Call Back Number:   Date of Last Office Visit: 01/04/2023     Date of Next Office Visit: 02/24/2023    Medication(s) to be Refilled: Fluid pills  Preferred Pharmacy:   ** Please notify patient to allow 48-72 hours to process** **Let patient know to contact pharmacy at the end of the day to make sure medication is ready. ** **If patient has not been seen in a year or longer, book an appointment **Advise to use MyChart for refill requests OR to contact their pharmacy

## 2023-02-25 ENCOUNTER — Encounter (HOSPITAL_COMMUNITY)
Admission: RE | Admit: 2023-02-25 | Discharge: 2023-02-25 | Disposition: A | Discharge status: Home / Self Care | Payer: BLUE CROSS/BLUE SHIELD | Source: Ambulatory Visit | Attending: Internal Medicine | Admitting: Internal Medicine

## 2023-02-25 DIAGNOSIS — I272 Pulmonary hypertension, unspecified: Secondary | ICD-10-CM | POA: Diagnosis not present

## 2023-02-25 NOTE — Telephone Encounter (Signed)
Done yesterday.

## 2023-02-25 NOTE — Progress Notes (Signed)
Daily Session Note  Patient Details  Name: Claudia [name] MRN: [id number] Date of Birth: 20-Oct-1965 Referring Provider:   Doristine Devoid Pulmonary Rehab Walk Test from 01/13/2023 in [facility]  Referring Provider Bensimhon       Encounter Date: 02/25/2023  Check In:  Session Check In - 02/25/23 1414       Check-In   Supervising physician immediately available to respond to emergencies [facility] MD immediately available    Physician(s) Edd Fabian, NP    Location [facility]    Staff Present Essie Hart, RN, BSN;Randi Idelle Crouch BS, ACSM-CEP, Exercise Physiologist;Kaylee Earlene Plater, MS, ACSM-CEP, Exercise Physiologist;Casey Hermine Messick Belarus, RD, Aleatha Borer, MS, ACSM-CEP, CCRP, Exercise Physiologist    Virtual Visit No    Medication changes reported     No    Fall or balance concerns reported    No    Tobacco Cessation No Change    Warm-up and Cool-down Performed as group-led instruction    Resistance Training Performed Yes    VAD Patient? No    PAD/SET Patient? No      Pain Assessment   Currently in Pain? No/denies    Multiple Pain Sites No             Capillary Blood Glucose: No results found for this or any previous visit (from the past 24 hour(s)).    Social History   Tobacco Use  Smoking Status Never  Smokeless Tobacco Never    Goals Met:  Independence with exercise equipment Exercise tolerated well No report of concerns or symptoms today Strength training completed today  Goals Unmet:  Not Applicable  Comments: Service time is from 1308 to 1446    Dr. Mechele Collin is Medical Director for Pulmonary Rehab at [facility].

## 2023-02-25 NOTE — Progress Notes (Addendum)
Home Exercise Prescription I have reviewed a Home Exercise Prescription with Claudia [name]. She is currently walking in the grocery store for 15-20 min 2 nonrehab days a week. Encouraged her to keep doing so with the goal of increasing time as able to 30 min. She has also joined a gym and she can go and do similar exercises as she does here (track and recumbent stepper), which would be more optimal for her knees. Pt agreed. The patient stated that their goals were to continue to lose weight and keep exercising for her health. We reviewed exercise guidelines, target heart rate during exercise, RPE Scale, weather conditions, endpoints for exercise, warmup and cool down. The patient is encouraged to come to me with any questions. I will continue to follow up with the patient to assist them with progression and safety.  Spent 15 min discussing home exercise plan and goals.  Jet Traynham Simpson, [place], ACSM-CEP 02/25/2023 3:30 PM

## 2023-02-27 DIAGNOSIS — G4733 Obstructive sleep apnea (adult) (pediatric): Secondary | ICD-10-CM | POA: Diagnosis not present

## 2023-02-27 DIAGNOSIS — I10 Essential (primary) hypertension: Secondary | ICD-10-CM | POA: Diagnosis not present

## 2023-03-02 ENCOUNTER — Encounter (HOSPITAL_COMMUNITY)
Admission: RE | Admit: 2023-03-02 | Discharge: 2023-03-02 | Disposition: A | Discharge status: Home / Self Care | Payer: BLUE CROSS/BLUE SHIELD | Source: Ambulatory Visit | Attending: Internal Medicine | Admitting: Internal Medicine

## 2023-03-02 VITALS — Wt 247.1 lb

## 2023-03-02 DIAGNOSIS — I272 Pulmonary hypertension, unspecified: Secondary | ICD-10-CM | POA: Diagnosis present

## 2023-03-02 NOTE — Progress Notes (Signed)
Daily Session Note  Patient Details  Name: Claudia [name] MRN: [id number] Date of Birth: [birth year] Referring Provider:   Doristine Devoid Pulmonary Rehab Walk Test from 01/13/2023 in [facility]  Referring Provider Bensimhon       Encounter Date: 03/02/2023  Check In:  Session Check In - 03/02/23 1421       Check-In   Supervising physician immediately available to respond to emergencies [facility] MD immediately available    Physician(s) Carlyon Shadow, NP    Location [facility]    Staff Present Samantha Belarus, RD, LDN;Randi Dionisio Paschal, ACSM-CEP, Exercise Physiologist;Kaylee Earlene Plater, MS, ACSM-CEP, Exercise Physiologist;David Manus Gunning, MS, ACSM-CEP, CCRP, Exercise Physiologist;Oaklee Sunga Katrinka Blazing, RT    Virtual Visit No    Medication changes reported     No    Fall or balance concerns reported    No    Tobacco Cessation No Change    Warm-up and Cool-down Performed as group-led instruction    Resistance Training Performed Yes    VAD Patient? No    PAD/SET Patient? No      Pain Assessment   Currently in Pain? No/denies    Multiple Pain Sites No             Capillary Blood Glucose: No results found for this or any previous visit (from the past 24 hour(s)).    Social History   Tobacco Use  Smoking Status Never  Smokeless Tobacco Never    Goals Met:  Proper associated with RPD/PD & O2 Sat Independence with exercise equipment Exercise tolerated well No report of concerns or symptoms today Strength training completed today  Goals Unmet:  Not Applicable  Comments: Service time is from 1312 to 1438.    Dr. Mechele Collin is Medical Director for Pulmonary Rehab at [facility].

## 2023-03-03 ENCOUNTER — Ambulatory Visit: Payer: BLUE CROSS/BLUE SHIELD

## 2023-03-04 ENCOUNTER — Encounter (HOSPITAL_COMMUNITY): Payer: BLUE CROSS/BLUE SHIELD

## 2023-03-09 ENCOUNTER — Encounter (HOSPITAL_COMMUNITY): Payer: BLUE CROSS/BLUE SHIELD

## 2023-03-09 ENCOUNTER — Telehealth (HOSPITAL_COMMUNITY): Payer: Self-pay | Admitting: *Deleted

## 2023-03-10 NOTE — Progress Notes (Signed)
Pulmonary Individual Treatment Plan  Patient Details  Name: Claudia [name] MRN: [id number] Date of Birth: [birth year] Referring Provider:   Doristine Devoid Pulmonary Rehab Walk Test from 01/13/2023 in [facility]  Referring Provider Bensimhon       Initial Encounter Date:  Flowsheet Row Pulmonary Rehab Walk Test from 01/13/2023 in [facility]  Date 01/13/23       Visit Diagnosis: Pulmonary hypertension (HCC)  Patient's Home Medications on Admission:   Current Outpatient Medications:    acetaminophen (TYLENOL) 500 MG tablet, Take 500 mg by mouth every 6 (six) hours as needed for mild pain or moderate pain., Disp: , Rfl:    amLODipine (NORVASC) 5 MG tablet, Take 1 tablet (5 mg total) by mouth daily., Disp: 180 tablet, Rfl: 0   aspirin 81 MG EC tablet, Take 1 tablet (81 mg total) by mouth daily., Disp: 30 tablet, Rfl: 6   blood glucose meter kit and supplies KIT, Dispense based on patient and insurance preference. Use up to four times daily as directed. (FOR ICD-9 250.00, 250.01)., Disp: 1 each, Rfl: 0   Blood Glucose Monitoring Suppl (ACCU-CHEK GUIDE) w/Device KIT, 1 each by Does not apply route in the morning, at noon, in the evening, and at bedtime., Disp: 1 kit, Rfl: 0   carvedilol (COREG) 3.125 MG tablet, Take 1 tablet (3.125 mg total) by mouth 2 (two) times daily with a meal., Disp: 60 tablet, Rfl: 8   dapagliflozin propanediol (FARXIGA) 10 MG TABS tablet, Take 1 tablet (10 mg total) by mouth daily., Disp: 30 tablet, Rfl: 2   ferrous sulfate 325 (65 FE) MG tablet, Take 1 tablet (325 mg total) by mouth daily with breakfast., Disp: 60 tablet, Rfl: 6   furosemide (LASIX) 80 MG tablet, Take 1 tablet by mouth twice daily, Disp: 60 tablet, Rfl: 0   glucose blood (ACCU-CHEK GUIDE) test strip, Use as instructed to check sugar 4 times daily., Disp: 200 each, Rfl: 6   Lancets  (ACCU-CHEK MULTICLIX) lancets, Use as instructed to check blood sugar 4 times daily., Disp: 204 each, Rfl: 12   metFORMIN (GLUCOPHAGE) 1000 MG tablet, TAKE 1 TABLET BY MOUTH TWICE DAILY WITH A MEAL, Disp: 180 tablet, Rfl: 1   Omega-3 Fatty Acids (FISH OIL) 1000 MG CAPS, Take 1,000 mg by mouth daily., Disp: , Rfl:    OVER THE COUNTER MEDICATION, Take 1 capsule by mouth daily. Herbal Supplement  -- unknown name, Disp: , Rfl:    potassium chloride SA (KLOR-CON M) 20 MEQ tablet, Take 1 tablet (20 mEq total) by mouth daily., Disp: 90 tablet, Rfl: 3   sacubitril-valsartan (ENTRESTO) 97-103 MG, Take 1 tablet by mouth 2 (two) times daily., Disp: 60 tablet, Rfl: 7   Semaglutide, 2 MG/DOSE, (OZEMPIC, 2 MG/DOSE,) 8 MG/3ML SOPN, Inject 2 mg into the skin once a week., Disp: 3 mL, Rfl: 11   spironolactone (ALDACTONE) 25 MG tablet, Take 1 tablet (25 mg total) by mouth daily., Disp: 90 tablet, Rfl: 3  Past Medical History: Past Medical History:  Diagnosis Date   Diabetes mellitus    Fibroids    Headache(784.0)    History of echocardiogram    Echo 7/18: Moderate concentric LVH, EF 55-60, normal wall motion, grade 1 diastolic dysfunction, calcified aortic leaflets   Hypercholesterolemia    Hyperlipidemia    Hypertension    Osteoarthritis    , with mild meniscus  tear, followed by orthopedics--Dr [facility] 04/29/10   Right knee pain    Seizures (HCC)    as child   STEMI (ST elevation myocardial infarction) (HCC)    Vitamin D deficiency     Tobacco Use: Social History   Tobacco Use  Smoking Status Never  Smokeless Tobacco Never    Labs: Review Flowsheet  More data exists      Latest Ref Rng & Units 08/12/2022 09/16/2022 12/02/2022 12/03/2022 12/04/2022  Labs for ITP Cardiac and Pulmonary Rehab  Cholestrol 0 - 200 mg/dL - - 295  - -  LDL (calc) 0 - 99 mg/dL - - 621  - -  HDL-C >30 mg/dL - - 41  - -  Trlycerides <150 mg/dL - - 75  - -  Hemoglobin A1c 4.8 - 5.6 % 9.3  - 6.9  - -  PH, Arterial 7.35 -  7.45 - - - 7.45  7.326  7.393  -  PCO2 arterial 32 - 48 mmHg - - - 75  84.3  67.9  -  Bicarbonate 20.0 - 28.0 mmol/L - 32.1  34.2  - 52.8  44.2  41.4  45.6  53.2   TCO2 22 - 32 mmol/L - 34  36  - 47  43  -  O2 Saturation % - 71  70  - 90  96  98  85.6  74     Capillary Blood Glucose: Lab Results  Component Value Date   GLUCAP 110 (H) 01/13/2023   GLUCAP 135 (H) 12/07/2022   GLUCAP 163 (H) 12/07/2022   GLUCAP 113 (H) 12/07/2022   GLUCAP 119 (H) 12/06/2022     Pulmonary Assessment Scores:  Pulmonary Assessment Scores     Row Name 01/13/23 0923         ADL UCSD   ADL Phase Entry     SOB Score total 38       CAT Score   CAT Score 8       mMRC Score   mMRC Score 1             UCSD: Self-administered rating of dyspnea associated with activities of daily living (ADLs) 6-point scale (0 = "not at all" to 5 = "maximal or unable to do because of breathlessness")  Scoring Scores range from 0 to 120.  Minimally important difference is 5 units  CAT: CAT can identify the health impairment of COPD patients and is better correlated with disease progression.  CAT has a scoring range of zero to 40. The CAT score is classified into four groups of low (less than 10), medium (10 - 20), high (21-30) and very high (31-40) based on the impact level of disease on health status. A CAT score over 10 suggests significant symptoms.  A worsening CAT score could be explained by an exacerbation, poor medication adherence, poor inhaler technique, or progression of COPD or comorbid conditions.  CAT MCID is 2 points  mMRC: mMRC (Modified Medical Research Council) Dyspnea Scale is used to assess the degree of baseline functional disability in patients of respiratory disease due to dyspnea. No minimal important difference is established. A decrease in score of 1 point or greater is considered a positive change.   Pulmonary Function Assessment:  Pulmonary Function Assessment - 01/13/23 0922        Breath   Bilateral Breath Sounds Clear    Shortness of Breath Yes             Exercise Target  Goals: Exercise Program Goal: Individual exercise prescription set using results from initial 6 min walk test and THRR while considering  patient's activity barriers and safety.   Exercise Prescription Goal: Initial exercise prescription builds to 30-45 minutes a day of aerobic activity, 2-3 days per week.  Home exercise guidelines will be given to patient during program as part of exercise prescription that the participant will acknowledge.  Activity Barriers & Risk Stratification:  Activity Barriers & Cardiac Risk Stratification - 01/13/23 0921       Activity Barriers & Cardiac Risk Stratification   Activity Barriers Deconditioning;Muscular Weakness;Shortness of Breath;History of Falls;Arthritis             6 Minute Walk:  6 Minute Walk     Row Name 01/13/23 1124         6 Minute Walk   Phase Initial     Distance 650 feet     Walk Time 6 minutes     # of Rest Breaks 1  3:56-4:18     MPH 0.62     METS 2.25     RPE 11     Perceived Dyspnea  0     VO2 Peak 7.87     Symptoms Yes (comment)     Comments Dizziness. Resolved after drinking 8 oz of H2O     Resting HR 94 bpm     Resting BP 105/73     Resting Oxygen Saturation  98 %     Exercise Oxygen Saturation  during 6 min walk 1.51 %     Max Ex. HR 119 bpm     Max Ex. BP 133/88     2 Minute Post BP 127/87       Interval HR   1 Minute HR 109     2 Minute HR 119     3 Minute HR 104     4 Minute HR 93     5 Minute HR 111     6 Minute HR 112     2 Minute Post HR 97     Interval Heart Rate? Yes       Interval Oxygen   Interval Oxygen? Yes     Baseline Oxygen Saturation % 98 %     1 Minute Oxygen Saturation % 99 %     1 Minute Liters of Oxygen 2 L     2 Minute Oxygen Saturation % 98 %     2 Minute Liters of Oxygen 2 L     3 Minute Oxygen Saturation % 99 %     3 Minute Liters of Oxygen 2 L     4 Minute Oxygen  Saturation % 96 %     4 Minute Liters of Oxygen 2 L     5 Minute Oxygen Saturation % 98 %     5 Minute Liters of Oxygen 2 L     6 Minute Oxygen Saturation % 99 %     6 Minute Liters of Oxygen 2 L     2 Minute Post Oxygen Saturation % 99 %     2 Minute Post Liters of Oxygen 2 L              Oxygen Initial Assessment:  Oxygen Initial Assessment - 01/13/23 0923       Home Oxygen   Home Oxygen Device Home Concentrator;E-Tanks    Sleep Oxygen Prescription Continuous    Liters per minute 2    Home Exercise  Oxygen Prescription Continuous    Liters per minute 2    Home Resting Oxygen Prescription Continuous    Liters per minute 2    Compliance with Home Oxygen Use Yes      Initial 6 min Walk   Oxygen Used Continuous    Liters per minute 2      Program Oxygen Prescription   Program Oxygen Prescription Continuous    Liters per minute 2      Intervention   Short Term Goals To learn and exhibit compliance with exercise, home and travel O2 prescription;To learn and understand importance of maintaining oxygen saturations>88%;To learn and demonstrate proper use of respiratory medications;To learn and understand importance of monitoring SPO2 with pulse oximeter and demonstrate accurate use of the pulse oximeter.;To learn and demonstrate proper pursed lip breathing techniques or other breathing techniques.     Long  Term Goals Exhibits compliance with exercise, home  and travel O2 prescription;Verbalizes importance of monitoring SPO2 with pulse oximeter and return demonstration;Maintenance of O2 saturations>88%;Exhibits proper breathing techniques, such as pursed lip breathing or other method taught during program session;Compliance with respiratory medication;Demonstrates proper use of MDI's             Oxygen Re-Evaluation:  Oxygen Re-Evaluation     Row Name 02/05/23 0921 03/03/23 1358           Program Oxygen Prescription   Program Oxygen Prescription Continuous Continuous       Liters per minute 2 4      Comments -- New prescription change to 4L        Home Oxygen   Home Oxygen Device Home Concentrator;E-Tanks Home Concentrator;E-Tanks      Sleep Oxygen Prescription Continuous Continuous;BiPAP      Liters per minute 2 4      Home Exercise Oxygen Prescription Continuous Continuous      Liters per minute 2 4      Home Resting Oxygen Prescription Continuous Continuous      Liters per minute 2 4      Compliance with Home Oxygen Use Yes Yes        Goals/Expected Outcomes   Short Term Goals To learn and exhibit compliance with exercise, home and travel O2 prescription;To learn and understand importance of maintaining oxygen saturations>88%;To learn and demonstrate proper use of respiratory medications;To learn and understand importance of monitoring SPO2 with pulse oximeter and demonstrate accurate use of the pulse oximeter.;To learn and demonstrate proper pursed lip breathing techniques or other breathing techniques.  To learn and exhibit compliance with exercise, home and travel O2 prescription;To learn and understand importance of maintaining oxygen saturations>88%;To learn and demonstrate proper use of respiratory medications;To learn and understand importance of monitoring SPO2 with pulse oximeter and demonstrate accurate use of the pulse oximeter.;To learn and demonstrate proper pursed lip breathing techniques or other breathing techniques.       Long  Term Goals Exhibits compliance with exercise, home  and travel O2 prescription;Verbalizes importance of monitoring SPO2 with pulse oximeter and return demonstration;Maintenance of O2 saturations>88%;Exhibits proper breathing techniques, such as pursed lip breathing or other method taught during program session;Compliance with respiratory medication;Demonstrates proper use of MDI's Exhibits compliance with exercise, home  and travel O2 prescription;Verbalizes importance of monitoring SPO2 with pulse oximeter and return  demonstration;Maintenance of O2 saturations>88%;Exhibits proper breathing techniques, such as pursed lip breathing or other method taught during program session;Compliance with respiratory medication;Demonstrates proper use of MDI's      Comments -- Pt  O2 prescription changed to 4L by MD      Goals/Expected Outcomes Compliance and understanding of oxygen saturations monitoring and breathing techniques to decrease shortness of breath. Compliance and understanding of oxygen saturations monitoring and breathing techniques to decrease shortness of breath.               Oxygen Discharge (Final Oxygen Re-Evaluation):  Oxygen Re-Evaluation - 03/03/23 1358       Program Oxygen Prescription   Program Oxygen Prescription Continuous    Liters per minute 4    Comments New prescription change to 4L      Home Oxygen   Home Oxygen Device Home Concentrator;E-Tanks    Sleep Oxygen Prescription Continuous;BiPAP    Liters per minute 4    Home Exercise Oxygen Prescription Continuous    Liters per minute 4    Home Resting Oxygen Prescription Continuous    Liters per minute 4    Compliance with Home Oxygen Use Yes      Goals/Expected Outcomes   Short Term Goals To learn and exhibit compliance with exercise, home and travel O2 prescription;To learn and understand importance of maintaining oxygen saturations>88%;To learn and demonstrate proper use of respiratory medications;To learn and understand importance of monitoring SPO2 with pulse oximeter and demonstrate accurate use of the pulse oximeter.;To learn and demonstrate proper pursed lip breathing techniques or other breathing techniques.     Long  Term Goals Exhibits compliance with exercise, home  and travel O2 prescription;Verbalizes importance of monitoring SPO2 with pulse oximeter and return demonstration;Maintenance of O2 saturations>88%;Exhibits proper breathing techniques, such as pursed lip breathing or other method taught during program  session;Compliance with respiratory medication;Demonstrates proper use of MDI's    Comments Pt O2 prescription changed to 4L by MD    Goals/Expected Outcomes Compliance and understanding of oxygen saturations monitoring and breathing techniques to decrease shortness of breath.             Initial Exercise Prescription:  Initial Exercise Prescription - 01/13/23 1100       Date of Initial Exercise RX and Referring Provider   Date 01/13/23    Referring Provider Bensimhon    Expected Discharge Date 04/08/23      Oxygen   Oxygen Continuous    Liters 2    Maintain Oxygen Saturation 88% or higher      Recumbant Bike   Level 1    Watts 20    Minutes 15    METs 1.5      NuStep   Level 1    SPM 60    Minutes 15    METs 1      Prescription Details   Frequency (times per week) 2    Duration Progress to 30 minutes of continuous aerobic without signs/symptoms of physical distress      Intensity   THRR 40-80% of Max Heartrate 66-132    Ratings of Perceived Exertion 11-13    Perceived Dyspnea 0-4      Progression   Progression Continue to progress workloads to maintain intensity without signs/symptoms of physical distress.      Resistance Training   Training Prescription Yes    Weight red bands    Reps 10-15             Perform Capillary Blood Glucose checks as needed.  Exercise Prescription Changes:   Exercise Prescription Changes     Row Name 01/26/23 1500 02/09/23 1500 02/18/23 1509 02/25/23 1500 03/09/23 1100  Response to Exercise   Blood Pressure (Admit) 120/80 104/74 112/78 -- 98/68   Blood Pressure (Exercise) 134/80 -- -- -- --   Blood Pressure (Exit) 118/74 118/80 98/58 -- 108/78   Heart Rate (Admit) 94 bpm 96 bpm 97 bpm -- 91 bpm   Heart Rate (Exercise) 101 bpm 119 bpm 108 bpm -- 117 bpm   Heart Rate (Exit) 89 bpm 106 bpm 90 bpm -- 98 bpm   Oxygen Saturation (Admit) 97 % 98 % 100 % -- 99 %   Oxygen Saturation (Exercise) 99 % 98 % 98 % -- 100 %    Oxygen Saturation (Exit) 98 % 96 % 100 % -- 99 %   Rating of Perceived Exertion (Exercise) 11 9 11  -- 9   Perceived Dyspnea (Exercise) 0 1 1 -- 1   Duration Continue with 30 min of aerobic exercise without signs/symptoms of physical distress. Continue with 30 min of aerobic exercise without signs/symptoms of physical distress. Continue with 30 min of aerobic exercise without signs/symptoms of physical distress. -- Continue with 30 min of aerobic exercise without signs/symptoms of physical distress.   Intensity THRR unchanged THRR unchanged THRR unchanged -- THRR unchanged     Progression   Progression -- Continue to progress workloads to maintain intensity without signs/symptoms of physical distress. Continue to progress workloads to maintain intensity without signs/symptoms of physical distress. -- Continue to progress workloads to maintain intensity without signs/symptoms of physical distress.     Resistance Training   Training Prescription Yes Yes Yes -- Yes   Weight red bands red bands red bands -- red bands   Reps 10-15 10-15 10-15 -- 10-15   Time 10 Minutes 10 Minutes 10 Minutes -- 10 Minutes     Oxygen   Oxygen Continuous Continuous Continuous -- Continuous   Liters 2 2 2  -- 4     NuStep   Level 2 2 3  -- 3   SPM -- 60 -- -- --   Minutes 15 15 15  -- 15   METs 2.1 2.3 2.2 -- 2.1     Track   Laps 12 14 8  -- 10   Minutes 15 15 15  -- 15   METs 2.85 3.15 2.23 -- 2.54     Home Exercise Plan   Plans to continue exercise at -- -- -- [employer] (comment)  gym --   Frequency -- -- -- Add 2 additional days to program exercise sessions. --   Initial Home Exercises Provided -- -- -- 02/25/23 --     Oxygen   Maintain Oxygen Saturation 88% or higher 88% or higher 88% or higher -- 88% or higher            Exercise Comments:   Exercise Comments     Row Name 01/19/23 1532 02/25/23 1528         Exercise Comments Pt completed first day of exercise. Olivia exercised for  30 min on the Nustep at level 1. Olivia averaged 1.8 METs on the Nustep. She felt that the Nustep was somewhat easy. Will progress her to track walking for 15 min. Olivia performed the warmup and cooldown standing without limitations. Discussed METs and how to increase METs. Discussed with pt home exercise plan. She is currently walking in the grocery store for 15-20 min 2 nonrehab days a week. Encouraged her to keep doing so with the goal of increasing time as able to 30 min. She has also joined a gym and she can go  and do similar exercises as she does here (track and recumbent stepper), which would be more optimal for her knees. Pt agreed.               Exercise Goals and Review:   Exercise Goals     Row Name 01/13/23 0920 02/05/23 0916 03/03/23 1356         Exercise Goals   Increase Physical Activity Yes Yes Yes     Intervention Provide advice, education, support and counseling about physical activity/exercise needs.;Develop an individualized exercise prescription for aerobic and resistive training based on initial evaluation findings, risk stratification, comorbidities and participant's personal goals. Provide advice, education, support and counseling about physical activity/exercise needs.;Develop an individualized exercise prescription for aerobic and resistive training based on initial evaluation findings, risk stratification, comorbidities and participant's personal goals. Provide advice, education, support and counseling about physical activity/exercise needs.;Develop an individualized exercise prescription for aerobic and resistive training based on initial evaluation findings, risk stratification, comorbidities and participant's personal goals.     Expected Outcomes Short Term: Attend rehab on a regular basis to increase amount of physical activity.;Long Term: Add in home exercise to make exercise part of routine and to increase amount of physical activity.;Long Term: Exercising  regularly at least 3-5 days a week. Short Term: Attend rehab on a regular basis to increase amount of physical activity.;Long Term: Add in home exercise to make exercise part of routine and to increase amount of physical activity.;Long Term: Exercising regularly at least 3-5 days a week. Short Term: Attend rehab on a regular basis to increase amount of physical activity.;Long Term: Add in home exercise to make exercise part of routine and to increase amount of physical activity.;Long Term: Exercising regularly at least 3-5 days a week.     Increase Strength and Stamina Yes Yes Yes     Intervention Provide advice, education, support and counseling about physical activity/exercise needs.;Develop an individualized exercise prescription for aerobic and resistive training based on initial evaluation findings, risk stratification, comorbidities and participant's personal goals. Provide advice, education, support and counseling about physical activity/exercise needs.;Develop an individualized exercise prescription for aerobic and resistive training based on initial evaluation findings, risk stratification, comorbidities and participant's personal goals. Provide advice, education, support and counseling about physical activity/exercise needs.;Develop an individualized exercise prescription for aerobic and resistive training based on initial evaluation findings, risk stratification, comorbidities and participant's personal goals.     Expected Outcomes Short Term: Increase workloads from initial exercise prescription for resistance, speed, and METs.;Short Term: Perform resistance training exercises routinely during rehab and add in resistance training at home;Long Term: Improve cardiorespiratory fitness, muscular endurance and strength as measured by increased METs and functional capacity ( ) Short Term: Increase workloads from initial exercise prescription for resistance, speed, and METs.;Short Term: Perform resistance  training exercises routinely during rehab and add in resistance training at home;Long Term: Improve cardiorespiratory fitness, muscular endurance and strength as measured by increased METs and functional capacity ( ) Short Term: Increase workloads from initial exercise prescription for resistance, speed, and METs.;Short Term: Perform resistance training exercises routinely during rehab and add in resistance training at home;Long Term: Improve cardiorespiratory fitness, muscular endurance and strength as measured by increased METs and functional capacity ( )     Able to understand and use rate of perceived exertion (RPE) scale Yes Yes Yes     Intervention Provide education and explanation on how to use RPE scale Provide education and explanation on how to use RPE scale Provide education and explanation  on how to use RPE scale     Expected Outcomes Short Term: Able to use RPE daily in rehab to express subjective intensity level;Long Term:  Able to use RPE to guide intensity level when exercising independently Short Term: Able to use RPE daily in rehab to express subjective intensity level;Long Term:  Able to use RPE to guide intensity level when exercising independently Short Term: Able to use RPE daily in rehab to express subjective intensity level;Long Term:  Able to use RPE to guide intensity level when exercising independently     Able to understand and use Dyspnea scale Yes Yes Yes     Intervention Provide education and explanation on how to use Dyspnea scale Provide education and explanation on how to use Dyspnea scale Provide education and explanation on how to use Dyspnea scale     Expected Outcomes Short Term: Able to use Dyspnea scale daily in rehab to express subjective sense of shortness of breath during exertion;Long Term: Able to use Dyspnea scale to guide intensity level when exercising independently Short Term: Able to use Dyspnea scale daily in rehab to express subjective sense of  shortness of breath during exertion;Long Term: Able to use Dyspnea scale to guide intensity level when exercising independently Short Term: Able to use Dyspnea scale daily in rehab to express subjective sense of shortness of breath during exertion;Long Term: Able to use Dyspnea scale to guide intensity level when exercising independently     Knowledge and understanding of Target Heart Rate Range (THRR) Yes Yes Yes     Intervention Provide education and explanation of THRR including how the numbers were predicted and where they are located for reference Provide education and explanation of THRR including how the numbers were predicted and where they are located for reference Provide education and explanation of THRR including how the numbers were predicted and where they are located for reference     Expected Outcomes Short Term: Able to state/look up THRR;Long Term: Able to use THRR to govern intensity when exercising independently;Short Term: Able to use daily as guideline for intensity in rehab Short Term: Able to state/look up THRR;Long Term: Able to use THRR to govern intensity when exercising independently;Short Term: Able to use daily as guideline for intensity in rehab Short Term: Able to state/look up THRR;Long Term: Able to use THRR to govern intensity when exercising independently;Short Term: Able to use daily as guideline for intensity in rehab     Understanding of Exercise Prescription Yes Yes Yes     Intervention Provide education, explanation, and written materials on patient's individual exercise prescription Provide education, explanation, and written materials on patient's individual exercise prescription Provide education, explanation, and written materials on patient's individual exercise prescription     Expected Outcomes Short Term: Able to explain program exercise prescription;Long Term: Able to explain home exercise prescription to exercise independently Short Term: Able to explain  program exercise prescription;Long Term: Able to explain home exercise prescription to exercise independently Short Term: Able to explain program exercise prescription;Long Term: Able to explain home exercise prescription to exercise independently              Exercise Goals Re-Evaluation :  Exercise Goals Re-Evaluation     Row Name 02/05/23 0916 03/03/23 1356           Exercise Goal Re-Evaluation   Exercise Goals Review Increase Physical Activity;Able to understand and use Dyspnea scale;Understanding of Exercise Prescription;Increase Strength and Stamina;Knowledge and understanding of Target Heart Rate Range (  THRR);Able to understand and use rate of perceived exertion (RPE) scale Increase Physical Activity;Able to understand and use Dyspnea scale;Understanding of Exercise Prescription;Increase Strength and Stamina;Knowledge and understanding of Target Heart Rate Range (THRR);Able to understand and use rate of perceived exertion (RPE) scale      Comments Pt has completed 4 days of exercise. she missed the last session. She is exercising on the recumbent stepper for 15 min then walking the track for 15 min now. She is on level 2, 2.3 METs on the stepper and 3.15 METs on the track with the walker. She is tolerating well, progressing. Olivia performed the warmup and cooldown standing without limitations. Will progress as tolerated. Pt has completed 8 days of exercise, missing two sessions. She is exercising on the recumbent stepper for 15 min then walking the track for 15 min now. She is on level 3, 2.1 METs on the stepper and 2.54 METs on the track with the walker. She is tolerating well however has had a decrease in her METs. She has chronic back and leg pain but is motivated. Zollie Scale performs the warmup and cooldown standing without limitations. Will progress as tolerated.      Expected Outcomes Through rehab and exercising at home, patient will find it easier to carry out ADL's and will gain the  confidence to establish an exercise routine at home. Through rehab and exercising at home, patient will find it easier to carry out ADL's and will gain the confidence to establish an exercise routine at home.               Discharge Exercise Prescription (Final Exercise Prescription Changes):  Exercise Prescription Changes - 03/09/23 1100       Response to Exercise   Blood Pressure (Admit) 98/68    Blood Pressure (Exit) 108/78    Heart Rate (Admit) 91 bpm    Heart Rate (Exercise) 117 bpm    Heart Rate (Exit) 98 bpm    Oxygen Saturation (Admit) 99 %    Oxygen Saturation (Exercise) 100 %    Oxygen Saturation (Exit) 99 %    Rating of Perceived Exertion (Exercise) 9    Perceived Dyspnea (Exercise) 1    Duration Continue with 30 min of aerobic exercise without signs/symptoms of physical distress.    Intensity THRR unchanged      Progression   Progression Continue to progress workloads to maintain intensity without signs/symptoms of physical distress.      Resistance Training   Training Prescription Yes    Weight red bands    Reps 10-15    Time 10 Minutes      Oxygen   Oxygen Continuous    Liters 4      NuStep   Level 3    Minutes 15    METs 2.1      Track   Laps 10    Minutes 15    METs 2.54      Oxygen   Maintain Oxygen Saturation 88% or higher             Nutrition:  Target Goals: Understanding of nutrition guidelines, daily intake of sodium 1500mg , cholesterol 200mg , calories 30% from fat and 7% or less from saturated fats, daily to have 5 or more servings of fruits and vegetables.  Biometrics:    Nutrition Therapy Plan and Nutrition Goals:  Nutrition Therapy & Goals - 02/18/23 1421       Nutrition Therapy   Diet Heart Healthy/Carbohydrate Consistent diet  Personal Nutrition Goals   Nutrition Goal Patient to improve diet quality by using the plate method as a guide for meal planning to include lean protein/plant protein, fruits, vegetables,  whole grains, nonfat dairy as part of a well-balanced diet.    Personal Goal #2 Patient to limit sodium intake 2300mg  per day.    Personal Goal #3 Patient to identify strategies for weight loss of 0.5-2.0# per week.    Comments Goals in action. Pollie has medical history of CHF, pulmonary HTN, aortic athersclerosis, DM2. Per documentation on 12/11/22, she is unable to take statins and declined zetia. She reports that she is motivated to lose weight and improve blood sugar control; she continues ozempic. She monitors weight, fluid intake, and sodium intake daily. Her weight is up today; however, she acknowledges slightly different eating patterns/higher sodium over the last few days. She has good knowledge of sodium recommendations, food sources of sodium, etc and reading food labels for sodium. Vernia will benefit from participation in pulmonary rehab for nutrition, exercise, and lifestyle modification.      Intervention Plan   Intervention Prescribe, educate and counsel regarding individualized specific dietary modifications aiming towards targeted core components such as weight, hypertension, lipid management, diabetes, heart failure and other comorbidities.;Nutrition handout(s) given to patient.    Expected Outcomes Short Term Goal: Understand basic principles of dietary content, such as calories, fat, sodium, cholesterol and nutrients.;Long Term Goal: Adherence to prescribed nutrition plan.             Nutrition Assessments:  MEDIFICTS Score Key: ?70 Need to make dietary changes  40-70 Heart Healthy Diet ? 40 Therapeutic Level Cholesterol Diet   Picture Your Plate Scores: <16 Unhealthy dietary pattern with much room for improvement. 41-50 Dietary pattern unlikely to meet recommendations for good health and room for improvement. 51-60 More healthful dietary pattern, with some room for improvement.  >60 Healthy dietary pattern, although there may be some specific behaviors that could be  improved.    Nutrition Goals Re-Evaluation:  Nutrition Goals Re-Evaluation     Row Name 01/19/23 1438 02/18/23 1421           Goals   Current Weight 242 lb 15.2 oz (110.2 kg) 247 lb 9.2 oz (112.3 kg)      Comment A1c 6.9, LDL 140 no new labs; most recent labs A1c 6.9, LDL 140      Expected Outcome Kayloni has medical history of CHF, pulmonary HTN, aortic athersclerosis, DM2. Per documentation on 12/11/22, she is unable to take statins and declined zetia. She reports that she is motivated to lose weight and improve blood sugar control; she continues ozempic. She monitors weight, fluid intake, and sodium intake daily. Talesa will benefit from participation in pulmonary rehab for nutrition, exercise, and lifestyle modification. Goals in action. Taralyn has medical history of CHF, pulmonary HTN, aortic athersclerosis, DM2. Per documentation on 12/11/22, she is unable to take statins and declined zetia. She reports that she is motivated to lose weight and improve blood sugar control; she continues ozempic. She monitors weight, fluid intake, and sodium intake daily. Her weight is up today; however, she acknowledges slightly different eating patterns/higher sodium over the last few days. She has good knowledge of sodium recommendations, food sources of sodium, etc and reading food labels for sodium. Kiyono will benefit from participation in pulmonary rehab for nutrition, exercise, and lifestyle modification.               Nutrition Goals Discharge (Final Nutrition Goals  Re-Evaluation):  Nutrition Goals Re-Evaluation - 02/18/23 1421       Goals   Current Weight 247 lb 9.2 oz (112.3 kg)    Comment no new labs; most recent labs A1c 6.9, LDL 140    Expected Outcome Goals in action. Lamonica has medical history of CHF, pulmonary HTN, aortic athersclerosis, DM2. Per documentation on 12/11/22, she is unable to take statins and declined zetia. She reports that she is motivated to lose weight and improve blood  sugar control; she continues ozempic. She monitors weight, fluid intake, and sodium intake daily. Her weight is up today; however, she acknowledges slightly different eating patterns/higher sodium over the last few days. She has good knowledge of sodium recommendations, food sources of sodium, etc and reading food labels for sodium. Lakeidra will benefit from participation in pulmonary rehab for nutrition, exercise, and lifestyle modification.             Psychosocial: Target Goals: Acknowledge presence or absence of significant depression and/or stress, maximize coping skills, provide positive support system. Participant is able to verbalize types and ability to use techniques and skills needed for reducing stress and depression.  Initial Review & Psychosocial Screening:  Initial Psych Review & Screening - 01/13/23 0949       Initial Review   Current issues with Current Stress Concerns    Source of Stress Concerns Chronic Illness;Retirement/disability    Comments Pt is currently waiting disability. Offered referral to therapy and/or medications as pt declined. Pt feels she has a good support system to help alleviate some stress.      Family Dynamics   Good Support System? Yes    Comments spouse and children      Barriers   Psychosocial barriers to participate in program The patient should benefit from training in stress management and relaxation.      Screening Interventions   Interventions Encouraged to exercise;To provide support and resources with identified psychosocial needs    Expected Outcomes Short Term goal: Utilizing psychosocial counselor, staff and physician to assist with identification of specific Stressors or current issues interfering with healing process. Setting desired goal for each stressor or current issue identified.;Long Term Goal: Stressors or current issues are controlled or eliminated.;Short Term goal: Identification and review with participant of any Quality of  Life or Depression concerns found by scoring the questionnaire.;Long Term goal: The participant improves quality of Life and PHQ9 Scores as seen by post scores and/or verbalization of changes             Quality of Life Scores:  Scores of 19 and below usually indicate a poorer quality of life in these areas.  A difference of  2-3 points is a clinically meaningful difference.  A difference of 2-3 points in the total score of the Quality of Life Index has been associated with significant improvement in overall quality of life, self-image, physical symptoms, and general health in studies assessing change in quality of life.  PHQ-9: Review Flowsheet  More data exists      01/13/2023 12/11/2022 02/03/2022 08/25/2021 01/01/2020  Depression screen PHQ 2/9  Decreased Interest 0 0 0 0 0  Down, Depressed, Hopeless 1 0 0 0 0  PHQ - 2 Score 1 0 0 0 0  Altered sleeping 1 - - - -  Tired, decreased energy 1 - - - -  Change in appetite 1 - - - -  Feeling bad or failure about yourself  1 - - - -  Trouble  concentrating 0 - - - -  Moving slowly or fidgety/restless 0 - - - -  Suicidal thoughts 0 - - - -  PHQ-9 Score 5 - - - -  Difficult doing work/chores Somewhat difficult - - - -   Interpretation of Total Score  Total Score Depression Severity:  1-4 = Minimal depression, 5-9 = Mild depression, 10-14 = Moderate depression, 15-19 = Moderately severe depression, 20-27 = Severe depression   Psychosocial Evaluation and Intervention:  Psychosocial Evaluation - 01/13/23 0954       Psychosocial Evaluation & Interventions   Interventions Stress management education;Relaxation education;Encouraged to exercise with the program and follow exercise prescription    Comments Pt is stressed waiting on disability. She is remain positive throughout this stressfull situation.    Expected Outcomes For Jizzelle to participate in rehab free of psychosocial barriers.    Continue Psychosocial Services  Follow up required by  staff             Psychosocial Re-Evaluation:  Psychosocial Re-Evaluation     Row Name 02/04/23 1557 03/03/23 0900           Psychosocial Re-Evaluation   Current issues with Current Stress Concerns Current Stress Concerns      Comments Zollie Scale is still waiting on disability and continues to be stressed by this. She is doing well with managing her stress. Zollie Scale is still waiting on disability. She states that this has been very stressful because she is trying not to lose her house. She states her husband has picked up extra jobs to help makeup for her not being able to work at this time. Her mother has taken out a loan to give to Zollie Scale to help pay her rent until she gets her disability. Zollie Scale still refuses a referral to a therapist or medications. She has the support of her family and her pastor and she states "God 's got this". She also states that talking about her situation helps her feel better.      Expected Outcomes For Olivia to participate in PR free of any psychosocial barriers or concerns For Zollie Scale to participate in PR free of any psychosocial barriers or concerns      Interventions Encouraged to attend Pulmonary Rehabilitation for the exercise Encouraged to attend Pulmonary Rehabilitation for the exercise      Continue Psychosocial Services  Follow up required by staff Follow up required by staff               Psychosocial Discharge (Final Psychosocial Re-Evaluation):  Psychosocial Re-Evaluation - 03/03/23 0900       Psychosocial Re-Evaluation   Current issues with Current Stress Concerns    Comments Zollie Scale is still waiting on disability. She states that this has been very stressful because she is trying not to lose her house. She states her husband has picked up extra jobs to help makeup for her not being able to work at this time. Her mother has taken out a loan to give to Zollie Scale to help pay her rent until she gets her disability. Zollie Scale still refuses a referral to a  therapist or medications. She has the support of her family and her pastor and she states "God 's got this". She also states that talking about her situation helps her feel better.    Expected Outcomes For Olivia to participate in PR free of any psychosocial barriers or concerns    Interventions Encouraged to attend Pulmonary Rehabilitation for the exercise    Continue  Psychosocial Services  Follow up required by staff             Education: Education Goals: Education classes will be provided on a weekly basis, covering required topics. Participant will state understanding/return demonstration of topics presented.  Learning Barriers/Preferences:  Learning Barriers/Preferences - 01/13/23 0934       Learning Barriers/Preferences   Learning Barriers None    Learning Preferences None             Education Topics: Introduction to Pulmonary Rehab Group instruction provided by PowerPoint, verbal discussion, and written material to support subject matter. Instructor reviews what Pulmonary Rehab is, the purpose of the program, and how patients are referred.     Know Your Numbers Group instruction that is supported by a PowerPoint presentation. Instructor discusses importance of knowing and understanding resting, exercise, and post-exercise oxygen saturation, heart rate, and blood pressure. Oxygen saturation, heart rate, blood pressure, rating of perceived exertion, and dyspnea are reviewed along with a normal range for these values.    Exercise for the Pulmonary Patient Group instruction that is supported by a PowerPoint presentation. Instructor discusses benefits of exercise, core components of exercise, frequency, duration, and intensity of an exercise routine, importance of utilizing pulse oximetry during exercise, safety while exercising, and options of places to exercise outside of rehab.       MET Level  Group instruction provided by PowerPoint, verbal discussion, and  written material to support subject matter. Instructor reviews what METs are and how to increase METs.  Flowsheet Row PULMONARY REHAB OTHER RESPIRATORY from 01/19/2023 in [facility]  Date 01/19/23  Educator EP  Instruction Review Code 1- Verbalizes Understanding       Pulmonary Medications Verbally interactive group education provided by instructor with focus on inhaled medications and proper administration.   Anatomy and Physiology of the Respiratory System Group instruction provided by PowerPoint, verbal discussion, and written material to support subject matter. Instructor reviews respiratory cycle and anatomical components of the respiratory system and their functions. Instructor also reviews differences in obstructive and restrictive respiratory diseases with examples of each.    Oxygen Safety Group instruction provided by PowerPoint, verbal discussion, and written material to support subject matter. There is an overview of "What is Oxygen" and "Why do we need it".  Instructor also reviews how to create a safe environment for oxygen use, the importance of using oxygen as prescribed, and the risks of noncompliance. There is a brief discussion on traveling with oxygen and resources the patient may utilize.   Oxygen Use Group instruction provided by PowerPoint, verbal discussion, and written material to discuss how supplemental oxygen is prescribed and different types of oxygen supply systems. Resources for more information are provided.  Flowsheet Row PULMONARY REHAB OTHER RESPIRATORY from 01/21/2023 in [facility]  Date 01/21/23  Educator RT  Instruction Review Code 1- Verbalizes Understanding       Breathing Techniques Group instruction that is supported by demonstration and informational handouts. Instructor discusses the benefits of pursed lip and diaphragmatic  breathing and detailed demonstration on how to perform both.     Risk Factor Reduction Group instruction that is supported by a PowerPoint presentation. Instructor discusses the definition of a risk factor, different risk factors for pulmonary disease, and how the heart and lungs work together.   MD Day A group question and answer session with a medical doctor  that allows participants to ask questions that relate to their pulmonary disease state.   Nutrition for the Pulmonary Patient Group instruction provided by PowerPoint slides, verbal discussion, and written materials to support subject matter. The instructor gives an explanation and review of healthy diet recommendations, which includes a discussion on weight management, recommendations for fruit and vegetable consumption, as well as protein, fluid, caffeine, fiber, sodium, sugar, and alcohol. Tips for eating when patients are short of breath are discussed.    Other Education Group or individual verbal, written, or video instructions that support the educational goals of the pulmonary rehab program. Flowsheet Row PULMONARY REHAB OTHER RESPIRATORY from 02/25/2023 in [facility]  Date 02/25/23  Educator RN  Instruction Review Code 1- Verbalizes Understanding        Knowledge Questionnaire Score:  Knowledge Questionnaire Score - 01/13/23 0934       Knowledge Questionnaire Score   Pre Score 12/18             Core Components/Risk Factors/Patient Goals at Admission:  Personal Goals and Risk Factors at Admission - 01/13/23 0926       Core Components/Risk Factors/Patient Goals on Admission    Weight Management Yes;Weight Loss    Intervention Weight Management: Develop a combined nutrition and exercise program designed to reach desired caloric intake, while maintaining appropriate intake of nutrient and fiber, sodium and fats, and appropriate energy expenditure required  for the weight goal.;Weight Management: Provide education and appropriate resources to help participant work on and attain dietary goals.;Weight Management/Obesity: Establish reasonable short term and long term weight goals.;Obesity: Provide education and appropriate resources to help participant work on and attain dietary goals.    Expected Outcomes Short Term: Continue to assess and modify interventions until short term weight is achieved;Long Term: Adherence to nutrition and physical activity/exercise program aimed toward attainment of established weight goal;Weight Maintenance: Understanding of the daily nutrition guidelines, which includes 25-35% calories from fat, 7% or less cal from saturated fats, less than 200mg  cholesterol, less than 1.5gm of sodium, & 5 or more servings of fruits and vegetables daily;Weight Loss: Understanding of general recommendations for a balanced deficit meal plan, which promotes 1-2 lb weight loss per week and includes a negative energy balance of [id number] kcal/d;Understanding recommendations for meals to include 15-35% energy as protein, 25-35% energy from fat, 35-60% energy from carbohydrates, less than 200mg  of dietary cholesterol, 20-35 gm of total fiber daily;Understanding of distribution of calorie intake throughout the day with the consumption of 4-5 meals/snacks    Improve shortness of breath with ADL's Yes    Intervention Provide education, individualized exercise plan and daily activity instruction to help decrease symptoms of SOB with activities of daily living.    Expected Outcomes Short Term: Improve cardiorespiratory fitness to achieve a reduction of symptoms when performing ADLs;Long Term: Be able to perform more ADLs without symptoms or delay the onset of symptoms             Core Components/Risk Factors/Patient Goals Review:   Goals and Risk Factor Review     Row Name 02/04/23 1601 03/03/23 0915           Core Components/Risk Factors/Patient  Goals Review   Personal Goals Review Weight Management/Obesity;Improve shortness of breath with ADL's;Develop more efficient breathing techniques such as purse lipped breathing and diaphragmatic breathing and practicing self-pacing with activity.;Diabetes Weight Management/Obesity;Improve shortness of breath with ADL's;Stress      Review Pt  is doing well in PR. She is very pleasant and enjoy's coming to class. She uses purse lip breathing when she gets SOB. She is not currently on any respiratory medications. She has not met her weight loss goals yet, but is working with the staff dietician to meet this goal. She is currently working with staff dietician on how to better control her blood sugar. Zollie Scale is able to report her perceived exertion and dyspnea scal when asked by staff. Zollie Scale is doing well in PR. She is very pleasant and enjoy's coming to class. She has met her goal of developing more efficient breathing techniques. She still get's short of breath with ADL's, but uses her purse lip breathing to get through the activities. She is still working with staff dietician to achieve her weight loss goals. Zollie Scale is currently dealing with financial burdens which has caused her to be stressed. Staff will educate Olivia on healthy ways to manage stress. Zollie Scale will benefit from participation in PR for nutrition, exercise and lifestyle modification.      Expected Outcomes See admission goals See admission goals               Core Components/Risk Factors/Patient Goals at Discharge (Final Review):   Goals and Risk Factor Review - 03/03/23 0915       Core Components/Risk Factors/Patient Goals Review   Personal Goals Review Weight Management/Obesity;Improve shortness of breath with ADL's;Stress    Review Zollie Scale is doing well in PR. She is very pleasant and enjoy's coming to class. She has met her goal of developing more efficient breathing techniques. She still get's short of breath with ADL's, but uses  her purse lip breathing to get through the activities. She is still working with staff dietician to achieve her weight loss goals. Zollie Scale is currently dealing with financial burdens which has caused her to be stressed. Staff will educate Olivia on healthy ways to manage stress. Zollie Scale will benefit from participation in PR for nutrition, exercise and lifestyle modification.    Expected Outcomes See admission goals             ITP Comments:   Comments: Pt is making expected progress toward Pulmonary Rehab goals after completing 8 sessions. Recommend continued exercise, life style modification, education, and utilization of breathing techniques to increase stamina and strength, while also decreasing shortness of breath with exertion.  Dr. Mechele Collin is Medical Director for Pulmonary Rehab at [facility].

## 2023-03-11 ENCOUNTER — Encounter (HOSPITAL_COMMUNITY)
Admission: RE | Admit: 2023-03-11 | Discharge: 2023-03-11 | Disposition: A | Discharge status: Home / Self Care | Payer: BLUE CROSS/BLUE SHIELD | Source: Ambulatory Visit | Attending: Internal Medicine | Admitting: Internal Medicine

## 2023-03-11 DIAGNOSIS — I272 Pulmonary hypertension, unspecified: Secondary | ICD-10-CM | POA: Diagnosis not present

## 2023-03-11 NOTE — Progress Notes (Signed)
Daily Session Note  Patient Details  Name: Claudia [name] MRN: [id number] Date of Birth: Jun 12, 1966 Referring Provider:   Doristine Devoid Pulmonary Rehab Walk Test from 01/13/2023 in [facility]  Referring Provider Bensimhon       Encounter Date: 03/11/2023  Check In:  Session Check In - 03/11/23 1431       Check-In   Supervising physician immediately available to respond to emergencies [facility] MD immediately available    Physician(s) Eligha Bridegroom, NP    Location [facility]    Staff Present Elissa Lovett BS, ACSM-CEP, Exercise Physiologist;Casey Chester Holstein, MS, Exercise Physiologist;Kellen Hover Gerre Scull, RN, BSN    Virtual Visit No    Medication changes reported     No    Fall or balance concerns reported    No    Tobacco Cessation No Change    Warm-up and Cool-down Performed as group-led instruction    Resistance Training Performed Yes    VAD Patient? No    PAD/SET Patient? No      Pain Assessment   Currently in Pain? No/denies    Multiple Pain Sites No             Capillary Blood Glucose: No results found for this or any previous visit (from the past 24 hour(s)).    Social History   Tobacco Use  Smoking Status Never  Smokeless Tobacco Never    Goals Met:  Independence with exercise equipment Exercise tolerated well No report of concerns or symptoms today Strength training completed today  Goals Unmet:  Not Applicable  Comments: Service time is from 1305 to 1455    Dr. Mechele Collin is Medical Director for Pulmonary Rehab at [facility].

## 2023-03-15 ENCOUNTER — Ambulatory Visit: Payer: Self-pay | Admitting: Nurse Practitioner

## 2023-03-16 ENCOUNTER — Encounter (HOSPITAL_COMMUNITY)
Admission: RE | Admit: 2023-03-16 | Discharge: 2023-03-16 | Disposition: A | Discharge status: Home / Self Care | Payer: BLUE CROSS/BLUE SHIELD | Source: Ambulatory Visit | Attending: Internal Medicine | Admitting: Internal Medicine

## 2023-03-16 DIAGNOSIS — I272 Pulmonary hypertension, unspecified: Secondary | ICD-10-CM

## 2023-03-16 NOTE — Progress Notes (Signed)
Daily Session Note  Patient Details  Name: Claudia [name] MRN: [id number] Date of Birth: [birth year] Referring Provider:   Doristine Devoid Pulmonary Rehab Walk Test from 01/13/2023 in [facility]  Referring Provider Bensimhon       Encounter Date: 03/16/2023  Check In:  Session Check In - 03/16/23 1432       Check-In   Supervising physician immediately available to respond to emergencies [facility] immediately available    Physician(s) Eligha Bridegroom, NP    [facility]    Staff Present Elissa Lovett BS, ACSM-CEP, Exercise Physiologist;Casey Synthia Innocent, RN, Doris Cheadle, MS, ACSM-CEP, Exercise Physiologist;Maria Whitaker, RN, BSN    Virtual Visit No    Medication changes reported     No    Fall or balance concerns reported    No    Tobacco Cessation No Change    Warm-up and Cool-down Performed as group-led instruction    Resistance Training Performed Yes    VAD Patient? No    PAD/SET Patient? No      Pain Assessment   Currently in Pain? No/denies    Multiple Pain Sites No             Capillary Blood Glucose: No results found for this or any previous visit (from the past 24 hour(s)).    Social History   Tobacco Use  Smoking Status Never  Smokeless Tobacco Never    Goals Met:  Proper associated with RPD/PD & O2 Sat Independence with exercise equipment Exercise tolerated well No report of concerns or symptoms today Strength training completed today  Goals Unmet:  Not Applicable  Comments: Service time is from 1304 to 1440.    Dr. Mechele Collin is Medical Director for Pulmonary Rehab at [facility].

## 2023-03-18 ENCOUNTER — Telehealth (HOSPITAL_COMMUNITY): Payer: Self-pay | Admitting: *Deleted

## 2023-03-18 ENCOUNTER — Encounter (HOSPITAL_COMMUNITY): Payer: BLUE CROSS/BLUE SHIELD

## 2023-03-18 DIAGNOSIS — I10 Essential (primary) hypertension: Secondary | ICD-10-CM | POA: Diagnosis not present

## 2023-03-18 DIAGNOSIS — G4733 Obstructive sleep apnea (adult) (pediatric): Secondary | ICD-10-CM | POA: Diagnosis not present

## 2023-03-23 ENCOUNTER — Telehealth (HOSPITAL_COMMUNITY): Payer: Self-pay | Admitting: *Deleted

## 2023-03-23 ENCOUNTER — Encounter (HOSPITAL_COMMUNITY): Payer: BLUE CROSS/BLUE SHIELD

## 2023-03-23 ENCOUNTER — Telehealth (HOSPITAL_COMMUNITY): Payer: Self-pay

## 2023-03-23 NOTE — Telephone Encounter (Signed)
Patient left message on department voicemail last night. She will not be able to attend pulmonary rehabilitation today due to car problems but hopes to return Thursday. She requested call back. Will forward to pulmonary rehab staff for follow-up.

## 2023-03-23 NOTE — Telephone Encounter (Signed)
Returned pt call however no answer.  Ethelda Chick BS, ACSM-CEP 03/23/2023 3:57 PM

## 2023-03-23 NOTE — Telephone Encounter (Signed)
Called [facility] after she left a message stating she would miss Pulm Rehab this week due to car troubles. She has found a ride for next week and will be back on Tuesday.

## 2023-03-23 NOTE — Addendum Note (Signed)
Encounter addended by: Essie Hart, RN on: 03/23/2023 3:06 PM  Actions taken: Flowsheet data copied forward, Flowsheet accepted

## 2023-03-25 ENCOUNTER — Other Ambulatory Visit: Payer: Self-pay | Admitting: Nurse Practitioner

## 2023-03-25 ENCOUNTER — Encounter (HOSPITAL_COMMUNITY): Payer: BLUE CROSS/BLUE SHIELD

## 2023-03-29 ENCOUNTER — Other Ambulatory Visit: Payer: Self-pay | Admitting: Nurse Practitioner

## 2023-03-29 ENCOUNTER — Ambulatory Visit
Admission: RE | Admit: 2023-03-29 | Discharge: 2023-03-29 | Disposition: A | Discharge status: Home / Self Care | Payer: Medicaid Other | Source: Ambulatory Visit | Attending: Nurse Practitioner | Admitting: Nurse Practitioner

## 2023-03-29 DIAGNOSIS — G4733 Obstructive sleep apnea (adult) (pediatric): Secondary | ICD-10-CM | POA: Diagnosis not present

## 2023-03-29 DIAGNOSIS — Z1231 Encounter for screening mammogram for malignant neoplasm of breast: Secondary | ICD-10-CM | POA: Diagnosis not present

## 2023-03-29 DIAGNOSIS — I10 Essential (primary) hypertension: Secondary | ICD-10-CM | POA: Diagnosis not present

## 2023-03-30 ENCOUNTER — Encounter (HOSPITAL_COMMUNITY)
Admission: RE | Admit: 2023-03-30 | Discharge: 2023-03-30 | Disposition: A | Discharge status: Home / Self Care | Payer: BLUE CROSS/BLUE SHIELD | Source: Ambulatory Visit | Attending: Internal Medicine | Admitting: Internal Medicine

## 2023-03-30 VITALS — Wt 251.3 lb

## 2023-03-30 DIAGNOSIS — I272 Pulmonary hypertension, unspecified: Secondary | ICD-10-CM | POA: Insufficient documentation

## 2023-03-30 NOTE — Progress Notes (Signed)
Daily Session Note  Patient Details  Name: Claudia [name] MRN: [id number] Date of Birth: 04/30/1966 Referring Provider:   Doristine Devoid Pulmonary Rehab Walk Test from 01/13/2023 in [facility]  Referring Provider Bensimhon       Encounter Date: 03/30/2023  Check In:  Session Check In - 03/30/23 1450       Check-In   Supervising physician immediately available to respond to emergencies [facility] immediately available    Physician(s) Robin Searing, NP    [facility]    Staff Present Elissa Lovett BS, ACSM-CEP, Exercise Physiologist;Jalan Fariss Gerre Scull, RN, Doris Cheadle, MS, ACSM-CEP, Exercise Physiologist;Olinty Peggye Pitt, MS, ACSM-CEP, Exercise Physiologist;Samantha Belarus, RD, LDN    Virtual Visit No    Medication changes reported     No    Fall or balance concerns reported    No    Tobacco Cessation No Change    Warm-up and Cool-down Performed as group-led instruction    Resistance Training Performed Yes    VAD Patient? No    PAD/SET Patient? No      Pain Assessment   Currently in Pain? No/denies    Multiple Pain Sites No             Capillary Blood Glucose: No results found for this or any previous visit (from the past 24 hour(s)).    Social History   Tobacco Use  Smoking Status Never  Smokeless Tobacco Never    Goals Met:  Independence with exercise equipment Exercise tolerated well No report of concerns or symptoms today Strength training completed today  Goals Unmet:  Not Applicable  Comments: Service time is from 1333 to 1459    Dr. Mechele Collin is Medical Director for Pulmonary Rehab at [facility].

## 2023-04-06 ENCOUNTER — Encounter (HOSPITAL_COMMUNITY): Payer: Self-pay

## 2023-04-06 ENCOUNTER — Encounter (HOSPITAL_COMMUNITY)
Admission: RE | Admit: 2023-04-06 | Discharge: 2023-04-06 | Disposition: A | Discharge status: Home / Self Care | Payer: BLUE CROSS/BLUE SHIELD | Source: Ambulatory Visit | Attending: Internal Medicine | Admitting: Internal Medicine

## 2023-04-06 DIAGNOSIS — I272 Pulmonary hypertension, unspecified: Secondary | ICD-10-CM | POA: Diagnosis not present

## 2023-04-06 NOTE — Progress Notes (Deleted)
Daily Session Note  Patient Details  Name: Claudia [name] MRN: [id number] Date of Birth: 23-May-1966 Referring Provider:   Doristine Devoid Pulmonary Rehab Walk Test from 01/13/2023 in [facility]  Referring Provider Bensimhon       Encounter Date: 03/30/2023  Check In:   Capillary Blood Glucose: No results found for this or any previous visit (from the past 24 hour(s)).   Exercise Prescription Changes - 04/06/23 1500       Response to Exercise   Blood Pressure (Admit) 110/80    Blood Pressure (Exercise) 140/74    Blood Pressure (Exit) 108/70    Heart Rate (Admit) 95 bpm    Heart Rate (Exercise) 129 bpm    Heart Rate (Exit) 97 bpm    Oxygen Saturation (Admit) 100 %    Oxygen Saturation (Exercise) 94 %    Oxygen Saturation (Exit) 94 %    Rating of Perceived Exertion (Exercise) 9    Perceived Dyspnea (Exercise) 2    Duration Continue with 30 min of aerobic exercise without signs/symptoms of physical distress.    Intensity THRR unchanged      Progression   Progression Continue to progress workloads to maintain intensity without signs/symptoms of physical distress.      Resistance Training   Training Prescription Yes    Weight red bands    Reps 10-15    Time 10 Minutes      Oxygen   Oxygen Continuous    Liters 3      NuStep   Level 5    Minutes 15    METs 2.3      Track   Laps 9    Minutes 15    METs 2.38      Oxygen   Maintain Oxygen Saturation 88% or higher             Social History   Tobacco Use  Smoking Status Never  Smokeless Tobacco Never    Goals Met:  Independence with exercise equipment Exercise tolerated well No report of concerns or symptoms today Strength training completed today  Goals Unmet:  Not Applicable  Comments: Service time is from 1311 to 1509    Dr. Mechele Collin is Medical Director for Pulmonary Rehab at [facility].

## 2023-04-06 NOTE — Progress Notes (Signed)
Daily Session Note  Patient Details  Name: Claudia [name] MRN: [id number] Date of Birth: [birth year] Referring Provider:   Doristine Devoid Pulmonary Rehab Walk Test from 01/13/2023 in [facility]  Referring Provider Bensimhon       Encounter Date: 04/06/2023  Check In:  Session Check In - 04/06/23 1426       Check-In   Supervising physician immediately available to respond to emergencies [facility] immediately available    Physician(s) Carlyon Shadow, NP    [facility]    Staff Present Velora Mediate, RN, MSN;Samantha Belarus, RD, Dutch Gray, RN, BSN;Randi Reeve BS, ACSM-CEP, Exercise Physiologist;Kaylee Earlene Plater, MS, ACSM-CEP, Exercise Physiologist;Casey Katrinka Blazing, RT    Virtual Visit No    Medication changes reported     No    Fall or balance concerns reported    No    Tobacco Cessation No Change    Warm-up and Cool-down Performed as group-led instruction    Resistance Training Performed Yes    VAD Patient? No    PAD/SET Patient? No      Pain Assessment   Currently in Pain? No/denies    Multiple Pain Sites No             Capillary Blood Glucose: No results found for this or any previous visit (from the past 24 hour(s)).    Social History   Tobacco Use  Smoking Status Never  Smokeless Tobacco Never    Goals Met:  Independence with exercise equipment Exercise tolerated well No report of concerns or symptoms today Strength training completed today  Goals Unmet:  Not Applicable  Comments: Service time is from 1311 to 1509    Dr. Mechele Collin is Medical Director for Pulmonary Rehab at [facility].

## 2023-04-07 NOTE — Progress Notes (Signed)
Pulmonary Individual Treatment Plan  Patient Details  Name: Claudia [name] MRN: [id number] Date of Birth: [birth year] Referring Provider:   Doristine Devoid Pulmonary Rehab Walk Test from 01/13/2023 in [facility]  Referring Provider Bensimhon       Initial Encounter Date:  Flowsheet Row Pulmonary Rehab Walk Test from 01/13/2023 in [facility]  Date 01/13/23       Visit Diagnosis: Pulmonary hypertension (HCC)  Patient's Home Medications on Admission:   Current Outpatient Medications:    acetaminophen (TYLENOL) 500 MG tablet, Take 500 mg by mouth every 6 (six) hours as needed for mild pain or moderate pain., Disp: , Rfl:    amLODipine (NORVASC) 5 MG tablet, Take 1 tablet (5 mg total) by mouth daily., Disp: 180 tablet, Rfl: 0   aspirin 81 MG EC tablet, Take 1 tablet (81 mg total) by mouth daily., Disp: 30 tablet, Rfl: 6   blood glucose meter kit and supplies KIT, Dispense based on patient and insurance preference. Use up to four times daily as directed. (FOR ICD-9 250.00, 250.01)., Disp: 1 each, Rfl: 0   Blood Glucose Monitoring Suppl (ACCU-CHEK GUIDE) w/Device KIT, 1 each by Does not apply route in the morning, at noon, in the evening, and at bedtime., Disp: 1 kit, Rfl: 0   carvedilol (COREG) 3.125 MG tablet, Take 1 tablet (3.125 mg total) by mouth 2 (two) times daily with a meal., Disp: 60 tablet, Rfl: 8   dapagliflozin propanediol (FARXIGA) 10 MG TABS tablet, Take 1 tablet by mouth once daily, Disp: 30 tablet, Rfl: 0   ferrous sulfate 325 (65 FE) MG tablet, Take 1 tablet (325 mg total) by mouth daily with breakfast., Disp: 60 tablet, Rfl: 6   furosemide (LASIX) 80 MG tablet, Take 1 tablet by mouth twice daily, Disp: 60 tablet, Rfl: 0   glucose blood (ACCU-CHEK GUIDE) test strip, Use as instructed to check sugar 4 times daily., Disp: 200 each, Rfl: 6   Lancets (ACCU-CHEK  MULTICLIX) lancets, Use as instructed to check blood sugar 4 times daily., Disp: 204 each, Rfl: 12   metFORMIN (GLUCOPHAGE) 1000 MG tablet, TAKE 1 TABLET BY MOUTH TWICE DAILY WITH A MEAL, Disp: 180 tablet, Rfl: 1   Omega-3 Fatty Acids (FISH OIL) 1000 MG CAPS, Take 1,000 mg by mouth daily., Disp: , Rfl:    OVER THE COUNTER MEDICATION, Take 1 capsule by mouth daily. Herbal Supplement  -- unknown name, Disp: , Rfl:    potassium chloride SA (KLOR-CON M) 20 MEQ tablet, Take 1 tablet (20 mEq total) by mouth daily., Disp: 90 tablet, Rfl: 3   sacubitril-valsartan (ENTRESTO) 97-103 MG, Take 1 tablet by mouth 2 (two) times daily., Disp: 60 tablet, Rfl: 7   Semaglutide, 2 MG/DOSE, (OZEMPIC, 2 MG/DOSE,) 8 MG/3ML SOPN, Inject 2 mg into the skin once a week., Disp: 3 mL, Rfl: 11   spironolactone (ALDACTONE) 25 MG tablet, Take 1 tablet (25 mg total) by mouth daily., Disp: 90 tablet, Rfl: 3  Past Medical History: Past Medical History:  Diagnosis Date   Diabetes mellitus    Fibroids    Headache(784.0)    History of echocardiogram    Echo 7/18: Moderate concentric LVH, EF 55-60, normal wall motion, grade 1 diastolic dysfunction, calcified aortic leaflets   Hypercholesterolemia    Hyperlipidemia    Hypertension    Osteoarthritis    , with mild meniscus tear, followed  by orthopedics--Dr Darrelyn Hillock 04/29/10   Right knee pain    Seizures (HCC)    as child   STEMI (ST elevation myocardial infarction) (HCC)    Vitamin D deficiency     Tobacco Use: Social History   Tobacco Use  Smoking Status Never  Smokeless Tobacco Never    Labs: Review Flowsheet  More data exists      Latest Ref Rng & Units 08/12/2022 09/16/2022 12/02/2022 12/03/2022 12/04/2022  Labs for ITP Cardiac and Pulmonary Rehab  Cholestrol 0 - 200 mg/dL - - 098  - -  LDL (calc) 0 - 99 mg/dL - - 119  - -  HDL-C >14 mg/dL - - 41  - -  Trlycerides <150 mg/dL - - 75  - -  Hemoglobin A1c 4.8 - 5.6 % 9.3  - 6.9  - -  PH, Arterial 7.35 - 7.45 - - -  7.45  7.326  7.393  -  PCO2 arterial 32 - 48 mmHg - - - 75  84.3  67.9  -  Bicarbonate 20.0 - 28.0 mmol/L - 32.1  34.2  - 52.8  44.2  41.4  45.6  53.2   TCO2 22 - 32 mmol/L - 34  36  - 47  43  -  O2 Saturation % - 71  70  - 90  96  98  85.6  74     Capillary Blood Glucose: Lab Results  Component Value Date   GLUCAP 110 (H) 01/13/2023   GLUCAP 135 (H) 12/07/2022   GLUCAP 163 (H) 12/07/2022   GLUCAP 113 (H) 12/07/2022   GLUCAP 119 (H) 12/06/2022     Pulmonary Assessment Scores:  Pulmonary Assessment Scores     Row Name 01/13/23 0923 04/06/23 1644       ADL UCSD   ADL Phase Entry Exit    SOB Score total 38 52      CAT Score   CAT Score 8 13      mMRC Score   mMRC Score 1 --            UCSD: Self-administered rating of dyspnea associated with activities of daily living (ADLs) 6-point [name] (0 = "not at all" to 5 = "maximal or unable to do because of breathlessness")  Scoring Scores range from 0 to 120.  Minimally important difference is 5 units  CAT: CAT can identify the health impairment of COPD patients and is better correlated with disease progression.  CAT has a scoring range of zero to 40. The CAT score is classified into four groups of low (less than 10), medium (10 - 20), high (21-30) and very high (31-40) based on the impact level of disease on health status. A CAT score over 10 suggests significant symptoms.  A worsening CAT score could be explained by an exacerbation, poor medication adherence, poor inhaler technique, or progression of COPD or comorbid conditions.  CAT MCID is 2 points  mMRC: mMRC (Modified Medical Research Council) Dyspnea [name] is used to assess the degree of baseline functional disability in patients of respiratory disease due to dyspnea. No minimal important difference is established. A decrease in score of 1 point or greater is considered a positive change.   Pulmonary Function Assessment:  Pulmonary Function Assessment - 01/13/23 0922        Breath   Bilateral Breath Sounds Clear    Shortness of Breath Yes             Exercise Target Goals: Exercise  Program Goal: Individual exercise prescription set using results from initial 6 min walk test and THRR while considering  patient's activity barriers and safety.   Exercise Prescription Goal: Initial exercise prescription builds to 30-45 minutes a day of aerobic activity, 2-3 days per week.  Home exercise guidelines will be given to patient during program as part of exercise prescription that the participant will acknowledge.  Activity Barriers & Risk Stratification:  Activity Barriers & Cardiac Risk Stratification - 01/13/23 0921       Activity Barriers & Cardiac Risk Stratification   Activity Barriers Deconditioning;Muscular Weakness;Shortness of Breath;History of Falls;Arthritis             6 Minute Walk:  6 Minute Walk     Row Name 01/13/23 1124         6 Minute Walk   Phase Initial     Distance 650 feet     Walk Time 6 minutes     # of Rest Breaks 1  3:56-4:18     MPH 0.62     METS 2.25     RPE 11     Perceived Dyspnea  0     VO2 Peak 7.87     Symptoms Yes (comment)     Comments Dizziness. Resolved after drinking 8 oz of H2O     Resting HR 94 bpm     Resting BP 105/73     Resting Oxygen Saturation  98 %     Exercise Oxygen Saturation  during 6 min walk 1.51 %     Max Ex. HR 119 bpm     Max Ex. BP 133/88     2 Minute Post BP 127/87       Interval HR   1 Minute HR 109     2 Minute HR 119     3 Minute HR 104     4 Minute HR 93     5 Minute HR 111     6 Minute HR 112     2 Minute Post HR 97     Interval Heart Rate? Yes       Interval Oxygen   Interval Oxygen? Yes     Baseline Oxygen Saturation % 98 %     1 Minute Oxygen Saturation % 99 %     1 Minute Liters of Oxygen 2 L     2 Minute Oxygen Saturation % 98 %     2 Minute Liters of Oxygen 2 L     3 Minute Oxygen Saturation % 99 %     3 Minute Liters of Oxygen 2 L     4  Minute Oxygen Saturation % 96 %     4 Minute Liters of Oxygen 2 L     5 Minute Oxygen Saturation % 98 %     5 Minute Liters of Oxygen 2 L     6 Minute Oxygen Saturation % 99 %     6 Minute Liters of Oxygen 2 L     2 Minute Post Oxygen Saturation % 99 %     2 Minute Post Liters of Oxygen 2 L              Oxygen Initial Assessment:  Oxygen Initial Assessment - 03/30/23 0931       Home Oxygen   Liters per minute 3    Liters per minute 3      Program Oxygen Prescription   Liters per minute 3  Comments Pt reports that her prescription is 3L             Oxygen Re-Evaluation:  Oxygen Re-Evaluation     Row Name 02/05/23 1914 03/03/23 1358 03/30/23 0931         Program Oxygen Prescription   Program Oxygen Prescription Continuous Continuous Continuous     Liters per minute 2 4 --     Comments -- New prescription change to 4L --       Home Oxygen   Home Oxygen Device Home Concentrator;E-Tanks Home Concentrator;E-Tanks Home Concentrator;E-Tanks     Sleep Oxygen Prescription Continuous Continuous;BiPAP Continuous;BiPAP     Liters per minute 2 4 4      Home Exercise Oxygen Prescription Continuous Continuous Continuous     Liters per minute 2 4 --     Home Resting Oxygen Prescription Continuous Continuous Continuous     Liters per minute 2 4 --     Compliance with Home Oxygen Use Yes Yes Yes       Goals/Expected Outcomes   Short Term Goals To learn and exhibit compliance with exercise, home and travel O2 prescription;To learn and understand importance of maintaining oxygen saturations>88%;To learn and demonstrate proper use of respiratory medications;To learn and understand importance of monitoring SPO2 with pulse oximeter and demonstrate accurate use of the pulse oximeter.;To learn and demonstrate proper pursed lip breathing techniques or other breathing techniques.  To learn and exhibit compliance with exercise, home and travel O2 prescription;To learn and understand  importance of maintaining oxygen saturations>88%;To learn and demonstrate proper use of respiratory medications;To learn and understand importance of monitoring SPO2 with pulse oximeter and demonstrate accurate use of the pulse oximeter.;To learn and demonstrate proper pursed lip breathing techniques or other breathing techniques.  To learn and exhibit compliance with exercise, home and travel O2 prescription;To learn and understand importance of maintaining oxygen saturations>88%;To learn and demonstrate proper use of respiratory medications;To learn and understand importance of monitoring SPO2 with pulse oximeter and demonstrate accurate use of the pulse oximeter.;To learn and demonstrate proper pursed lip breathing techniques or other breathing techniques.      Long  Term Goals Exhibits compliance with exercise, home  and travel O2 prescription;Verbalizes importance of monitoring SPO2 with pulse oximeter and return demonstration;Maintenance of O2 saturations>88%;Exhibits proper breathing techniques, such as pursed lip breathing or other method taught during program session;Compliance with respiratory medication;Demonstrates proper use of MDI's Exhibits compliance with exercise, home  and travel O2 prescription;Verbalizes importance of monitoring SPO2 with pulse oximeter and return demonstration;Maintenance of O2 saturations>88%;Exhibits proper breathing techniques, such as pursed lip breathing or other method taught during program session;Compliance with respiratory medication;Demonstrates proper use of MDI's Exhibits compliance with exercise, home  and travel O2 prescription;Verbalizes importance of monitoring SPO2 with pulse oximeter and return demonstration;Maintenance of O2 saturations>88%;Exhibits proper breathing techniques, such as pursed lip breathing or other method taught during program session;Compliance with respiratory medication;Demonstrates proper use of MDI's     Comments -- Pt O2 prescription  changed to 4L by MD Pt O2 prescription changed to 4L by MD     Goals/Expected Outcomes Compliance and understanding of oxygen saturations monitoring and breathing techniques to decrease shortness of breath. Compliance and understanding of oxygen saturations monitoring and breathing techniques to decrease shortness of breath. Compliance and understanding of oxygen saturations monitoring and breathing techniques to decrease shortness of breath.              Oxygen Discharge (Final Oxygen Re-Evaluation):  Oxygen Re-Evaluation -  03/30/23 0931       Program Oxygen Prescription   Program Oxygen Prescription Continuous      Home Oxygen   Home Oxygen Device Home Concentrator;E-Tanks    Sleep Oxygen Prescription Continuous;BiPAP    Liters per minute 4    Home Exercise Oxygen Prescription Continuous    Home Resting Oxygen Prescription Continuous    Compliance with Home Oxygen Use Yes      Goals/Expected Outcomes   Short Term Goals To learn and exhibit compliance with exercise, home and travel O2 prescription;To learn and understand importance of maintaining oxygen saturations>88%;To learn and demonstrate proper use of respiratory medications;To learn and understand importance of monitoring SPO2 with pulse oximeter and demonstrate accurate use of the pulse oximeter.;To learn and demonstrate proper pursed lip breathing techniques or other breathing techniques.     Long  Term Goals Exhibits compliance with exercise, home  and travel O2 prescription;Verbalizes importance of monitoring SPO2 with pulse oximeter and return demonstration;Maintenance of O2 saturations>88%;Exhibits proper breathing techniques, such as pursed lip breathing or other method taught during program session;Compliance with respiratory medication;Demonstrates proper use of MDI's    Comments Pt O2 prescription changed to 4L by MD    Goals/Expected Outcomes Compliance and understanding of oxygen saturations monitoring and breathing  techniques to decrease shortness of breath.             Initial Exercise Prescription:  Initial Exercise Prescription - 01/13/23 1100       Date of Initial Exercise RX and Referring Provider   Date 01/13/23    Referring Provider Bensimhon    Expected Discharge Date 04/08/23      Oxygen   Oxygen Continuous    Liters 2    Maintain Oxygen Saturation 88% or higher      Recumbant Bike   Level 1    Watts 20    Minutes 15    METs 1.5      NuStep   Level 1    SPM 60    Minutes 15    METs 1      Prescription Details   Frequency (times per week) 2    Duration Progress to 30 minutes of continuous aerobic without signs/symptoms of physical distress      Intensity   THRR 40-80% of Max Heartrate 66-132    Ratings of Perceived Exertion 11-13    Perceived Dyspnea 0-4      Progression   Progression Continue to progress workloads to maintain intensity without signs/symptoms of physical distress.      Resistance Training   Training Prescription Yes    Weight red bands    Reps 10-15             Perform Capillary Blood Glucose checks as needed.  Exercise Prescription Changes:   Exercise Prescription Changes     Row Name 01/26/23 1500 02/09/23 1500 02/18/23 1509 02/25/23 1500 03/09/23 1100     Response to Exercise   Blood Pressure (Admit) 120/80 104/74 112/78 -- 98/68   Blood Pressure (Exercise) 134/80 -- -- -- --   Blood Pressure (Exit) 118/74 118/80 98/58 -- 108/78   Heart Rate (Admit) 94 bpm 96 bpm 97 bpm -- 91 bpm   Heart Rate (Exercise) 101 bpm 119 bpm 108 bpm -- 117 bpm   Heart Rate (Exit) 89 bpm 106 bpm 90 bpm -- 98 bpm   Oxygen Saturation (Admit) 97 % 98 % 100 % -- 99 %   Oxygen Saturation (Exercise) 99 %  98 % 98 % -- 100 %   Oxygen Saturation (Exit) 98 % 96 % 100 % -- 99 %   Rating of Perceived Exertion (Exercise) 11 9 11  -- 9   Perceived Dyspnea (Exercise) 0 1 1 -- 1   Duration Continue with 30 min of aerobic exercise without signs/symptoms of physical  distress. Continue with 30 min of aerobic exercise without signs/symptoms of physical distress. Continue with 30 min of aerobic exercise without signs/symptoms of physical distress. -- Continue with 30 min of aerobic exercise without signs/symptoms of physical distress.   Intensity THRR unchanged THRR unchanged THRR unchanged -- THRR unchanged     Progression   Progression -- Continue to progress workloads to maintain intensity without signs/symptoms of physical distress. Continue to progress workloads to maintain intensity without signs/symptoms of physical distress. -- Continue to progress workloads to maintain intensity without signs/symptoms of physical distress.     Resistance Training   Training Prescription Yes Yes Yes -- Yes   Weight red bands red bands red bands -- red bands   Reps 10-15 10-15 10-15 -- 10-15   Time 10 Minutes 10 Minutes 10 Minutes -- 10 Minutes     Oxygen   Oxygen Continuous Continuous Continuous -- Continuous   Liters 2 2 2  -- 4     NuStep   Level 2 2 3  -- 3   SPM -- 60 -- -- --   Minutes 15 15 15  -- 15   METs 2.1 2.3 2.2 -- 2.1     Track   Laps 12 14 8  -- 10   Minutes 15 15 15  -- 15   METs 2.85 3.15 2.23 -- 2.54     Home Exercise Plan   Plans to continue exercise at -- -- -- [employer] (comment)  gym --   Frequency -- -- -- Add 2 additional days to program exercise sessions. --   Initial Home Exercises Provided -- -- -- 02/25/23 --     Oxygen   Maintain Oxygen Saturation 88% or higher 88% or higher 88% or higher -- 88% or higher    Row Name 03/16/23 1504 04/06/23 1500           Response to Exercise   Blood Pressure (Admit) 102/68 110/80      Blood Pressure (Exercise) -- 140/74      Blood Pressure (Exit) 102/62 108/70      Heart Rate (Admit) 106 bpm 95 bpm      Heart Rate (Exercise) 126 bpm 129 bpm      Heart Rate (Exit) 97 bpm 97 bpm      Oxygen Saturation (Admit) 100 %  4L 100 %      Oxygen Saturation (Exercise) 100 %  4L 94 %       Oxygen Saturation (Exit) 100 %  4L 94 %      Rating of Perceived Exertion (Exercise) 12 9      Perceived Dyspnea (Exercise) 2 2      Duration Continue with 30 min of aerobic exercise without signs/symptoms of physical distress. Continue with 30 min of aerobic exercise without signs/symptoms of physical distress.      Intensity THRR unchanged THRR unchanged        Progression   Progression Continue to progress workloads to maintain intensity without signs/symptoms of physical distress. Continue to progress workloads to maintain intensity without signs/symptoms of physical distress.        Resistance Training   Training Prescription Yes Yes  Weight red bands red bands      Reps 10-15 10-15      Time 10 Minutes 10 Minutes        Oxygen   Oxygen Continuous Continuous      Liters 4 3        NuStep   Level 4 5      Minutes 15 15      METs 2.4 2.3        Track   Laps 10 9      Minutes 15 15      METs 2.54 2.38        Oxygen   Maintain Oxygen Saturation 88% or higher 88% or higher               Exercise Comments:   Exercise Comments     Row Name 01/19/23 1532 02/25/23 1528         Exercise Comments Pt completed first day of exercise. Claudia [name] exercised for 30 min on the Nustep at level 1. Claudia [name] averaged 1.8 METs on the Nustep. She felt that the Nustep was somewhat easy. Will progress her to track walking for 15 min. Claudia [name] performed the warmup and cooldown standing without limitations. Discussed METs and how to increase METs. Discussed with pt home exercise plan. She is currently walking in the grocery store for 15-20 min 2 nonrehab days a week. Encouraged her to keep doing so with the goal of increasing time as able to 30 min. She has also joined a gym and she can go and do similar exercises as she does here (track and recumbent stepper), which would be more optimal for her knees. Pt agreed.               Exercise Goals and Review:   Exercise Goals     Row Name  01/13/23 0920 02/05/23 0916 03/03/23 1356 03/30/23 0931       Exercise Goals   Increase Physical Activity Yes Yes Yes Yes    Intervention Provide advice, education, support and counseling about physical activity/exercise needs.;Develop an individualized exercise prescription for aerobic and resistive training based on initial evaluation findings, risk stratification, comorbidities and participant's personal goals. Provide advice, education, support and counseling about physical activity/exercise needs.;Develop an individualized exercise prescription for aerobic and resistive training based on initial evaluation findings, risk stratification, comorbidities and participant's personal goals. Provide advice, education, support and counseling about physical activity/exercise needs.;Develop an individualized exercise prescription for aerobic and resistive training based on initial evaluation findings, risk stratification, comorbidities and participant's personal goals. Provide advice, education, support and counseling about physical activity/exercise needs.;Develop an individualized exercise prescription for aerobic and resistive training based on initial evaluation findings, risk stratification, comorbidities and participant's personal goals.    Expected Outcomes Short Term: Attend rehab on a regular basis to increase amount of physical activity.;Long Term: Add in home exercise to make exercise part of routine and to increase amount of physical activity.;Long Term: Exercising regularly at least 3-5 days a week. Short Term: Attend rehab on a regular basis to increase amount of physical activity.;Long Term: Add in home exercise to make exercise part of routine and to increase amount of physical activity.;Long Term: Exercising regularly at least 3-5 days a week. Short Term: Attend rehab on a regular basis to increase amount of physical activity.;Long Term: Add in home exercise to make exercise part of routine and to  increase amount of physical activity.;Long Term: Exercising regularly at least 3-5 days a week.  Short Term: Attend rehab on a regular basis to increase amount of physical activity.;Long Term: Add in home exercise to make exercise part of routine and to increase amount of physical activity.;Long Term: Exercising regularly at least 3-5 days a week.    Increase Strength and Stamina Yes Yes Yes Yes    Intervention Provide advice, education, support and counseling about physical activity/exercise needs.;Develop an individualized exercise prescription for aerobic and resistive training based on initial evaluation findings, risk stratification, comorbidities and participant's personal goals. Provide advice, education, support and counseling about physical activity/exercise needs.;Develop an individualized exercise prescription for aerobic and resistive training based on initial evaluation findings, risk stratification, comorbidities and participant's personal goals. Provide advice, education, support and counseling about physical activity/exercise needs.;Develop an individualized exercise prescription for aerobic and resistive training based on initial evaluation findings, risk stratification, comorbidities and participant's personal goals. Provide advice, education, support and counseling about physical activity/exercise needs.;Develop an individualized exercise prescription for aerobic and resistive training based on initial evaluation findings, risk stratification, comorbidities and participant's personal goals.    Expected Outcomes Short Term: Increase workloads from initial exercise prescription for resistance, speed, and METs.;Short Term: Perform resistance training exercises routinely during rehab and add in resistance training at home;Long Term: Improve cardiorespiratory fitness, muscular endurance and strength as measured by increased METs and functional capacity ( ) Short Term: Increase workloads from  initial exercise prescription for resistance, speed, and METs.;Short Term: Perform resistance training exercises routinely during rehab and add in resistance training at home;Long Term: Improve cardiorespiratory fitness, muscular endurance and strength as measured by increased METs and functional capacity ( ) Short Term: Increase workloads from initial exercise prescription for resistance, speed, and METs.;Short Term: Perform resistance training exercises routinely during rehab and add in resistance training at home;Long Term: Improve cardiorespiratory fitness, muscular endurance and strength as measured by increased METs and functional capacity ( ) Short Term: Increase workloads from initial exercise prescription for resistance, speed, and METs.;Short Term: Perform resistance training exercises routinely during rehab and add in resistance training at home;Long Term: Improve cardiorespiratory fitness, muscular endurance and strength as measured by increased METs and functional capacity ( )    Able to understand and use rate of perceived exertion (RPE) [name] Yes Yes Yes Yes    Intervention Provide education and explanation on how to use RPE [name] Provide education and explanation on how to use RPE [name] Provide education and explanation on how to use RPE [name] Provide education and explanation on how to use RPE [name]    Expected Outcomes Short Term: Able to use RPE daily in rehab to express subjective intensity level;Long Term:  Able to use RPE to guide intensity level when exercising independently Short Term: Able to use RPE daily in rehab to express subjective intensity level;Long Term:  Able to use RPE to guide intensity level when exercising independently Short Term: Able to use RPE daily in rehab to express subjective intensity level;Long Term:  Able to use RPE to guide intensity level when exercising independently Short Term: Able to use RPE daily in rehab to express subjective intensity level;Long  Term:  Able to use RPE to guide intensity level when exercising independently    Able to understand and use Dyspnea [name] Yes Yes Yes Yes    Intervention Provide education and explanation on how to use Dyspnea [name] Provide education and explanation on how to use Dyspnea [name] Provide education and explanation on how to use Dyspnea [name] Provide education and explanation on how to use Dyspnea [name]  Expected Outcomes Short Term: Able to use Dyspnea [name] daily in rehab to express subjective sense of shortness of breath during exertion;Long Term: Able to use Dyspnea [name] to guide intensity level when exercising independently Short Term: Able to use Dyspnea [name] daily in rehab to express subjective sense of shortness of breath during exertion;Long Term: Able to use Dyspnea [name] to guide intensity level when exercising independently Short Term: Able to use Dyspnea [name] daily in rehab to express subjective sense of shortness of breath during exertion;Long Term: Able to use Dyspnea [name] to guide intensity level when exercising independently Short Term: Able to use Dyspnea [name] daily in rehab to express subjective sense of shortness of breath during exertion;Long Term: Able to use Dyspnea [name] to guide intensity level when exercising independently    Knowledge and understanding of Target Heart Rate Range (THRR) Yes Yes Yes Yes    Intervention Provide education and explanation of THRR including how the numbers were predicted and where they are located for reference Provide education and explanation of THRR including how the numbers were predicted and where they are located for reference Provide education and explanation of THRR including how the numbers were predicted and where they are located for reference Provide education and explanation of THRR including how the numbers were predicted and where they are located for reference    Expected Outcomes Short Term: Able to state/look up THRR;Long Term: Able to  use THRR to govern intensity when exercising independently;Short Term: Able to use daily as guideline for intensity in rehab Short Term: Able to state/look up THRR;Long Term: Able to use THRR to govern intensity when exercising independently;Short Term: Able to use daily as guideline for intensity in rehab Short Term: Able to state/look up THRR;Long Term: Able to use THRR to govern intensity when exercising independently;Short Term: Able to use daily as guideline for intensity in rehab Short Term: Able to state/look up THRR;Long Term: Able to use THRR to govern intensity when exercising independently;Short Term: Able to use daily as guideline for intensity in rehab    Understanding of Exercise Prescription Yes Yes Yes Yes    Intervention Provide education, explanation, and written materials on patient's individual exercise prescription Provide education, explanation, and written materials on patient's individual exercise prescription Provide education, explanation, and written materials on patient's individual exercise prescription Provide education, explanation, and written materials on patient's individual exercise prescription    Expected Outcomes Short Term: Able to explain program exercise prescription;Long Term: Able to explain home exercise prescription to exercise independently Short Term: Able to explain program exercise prescription;Long Term: Able to explain home exercise prescription to exercise independently Short Term: Able to explain program exercise prescription;Long Term: Able to explain home exercise prescription to exercise independently Short Term: Able to explain program exercise prescription;Long Term: Able to explain home exercise prescription to exercise independently             Exercise Goals Re-Evaluation :  Exercise Goals Re-Evaluation     Row Name 02/05/23 0916 03/03/23 1356 03/30/23 0931         Exercise Goal Re-Evaluation   Exercise Goals Review Increase Physical  Activity;Able to understand and use Dyspnea [name];Understanding of Exercise Prescription;Increase Strength and Stamina;Knowledge and understanding of Target Heart Rate Range (THRR);Able to understand and use rate of perceived exertion (RPE) [name] Increase Physical Activity;Able to understand and use Dyspnea [name];Understanding of Exercise Prescription;Increase Strength and Stamina;Knowledge and understanding of Target Heart Rate Range (THRR);Able to understand and use rate of perceived  exertion (RPE) [name] Increase Physical Activity;Able to understand and use Dyspnea [name];Understanding of Exercise Prescription;Increase Strength and Stamina;Knowledge and understanding of Target Heart Rate Range (THRR);Able to understand and use rate of perceived exertion (RPE) [name]     Comments Pt has completed 4 days of exercise. she missed the last session. She is exercising on the recumbent stepper for 15 min then walking the track for 15 min now. She is on level 2, 2.3 METs on the stepper and 3.15 METs on the track with the walker. She is tolerating well, progressing. Claudia [name] performed the warmup and cooldown standing without limitations. Will progress as tolerated. Pt has completed 8 days of exercise, missing two sessions. She is exercising on the recumbent stepper for 15 min then walking the track for 15 min now. She is on level 3, 2.1 METs on the stepper and 2.54 METs on the track with the walker. She is tolerating well however has had a decrease in her METs. She has chronic back and leg pain but is motivated. Claudia [name] performs the warmup and cooldown standing without limitations. Will progress as tolerated. Pt has completed 10 days of exercise, missing 10 sessions for various reasons, the last being car trouble. She is scheduled to graduate next week. She is exercising on the recumbent stepper for 15 min then walking the track for 15 min now. She is on level 4, 2.5 METs on the stepper and 2.54 METs on the track with the  walker. She is tolerating well.. She has chronic back and leg pain but is motivated. Claudia [name] performs the warmup and cooldown standing without limitations.     Expected Outcomes Through rehab and exercising at home, patient will find it easier to carry out ADL's and will gain the confidence to establish an exercise routine at home. Through rehab and exercising at home, patient will find it easier to carry out ADL's and will gain the confidence to establish an exercise routine at home. Through rehab and exercising at home, patient will find it easier to carry out ADL's and will gain the confidence to establish an exercise routine at home.              Discharge Exercise Prescription (Final Exercise Prescription Changes):  Exercise Prescription Changes - 04/06/23 1500       Response to Exercise   Blood Pressure (Admit) 110/80    Blood Pressure (Exercise) 140/74    Blood Pressure (Exit) 108/70    Heart Rate (Admit) 95 bpm    Heart Rate (Exercise) 129 bpm    Heart Rate (Exit) 97 bpm    Oxygen Saturation (Admit) 100 %    Oxygen Saturation (Exercise) 94 %    Oxygen Saturation (Exit) 94 %    Rating of Perceived Exertion (Exercise) 9    Perceived Dyspnea (Exercise) 2    Duration Continue with 30 min of aerobic exercise without signs/symptoms of physical distress.    Intensity THRR unchanged      Progression   Progression Continue to progress workloads to maintain intensity without signs/symptoms of physical distress.      Resistance Training   Training Prescription Yes    Weight red bands    Reps 10-15    Time 10 Minutes      Oxygen   Oxygen Continuous    Liters 3      NuStep   Level 5    Minutes 15    METs 2.3      Track  Laps 9    Minutes 15    METs 2.38      Oxygen   Maintain Oxygen Saturation 88% or higher             Nutrition:  Target Goals: Understanding of nutrition guidelines, daily intake of sodium 1500mg , cholesterol 200mg , calories 30% from fat and  7% or less from saturated fats, daily to have 5 or more servings of fruits and vegetables.  Biometrics:    Nutrition Therapy Plan and Nutrition Goals:  Nutrition Therapy & Goals - 03/23/23 1429       Nutrition Therapy   Diet Heart Healthy/Carbohydrate Consistent diet      Personal Nutrition Goals   Nutrition Goal Patient to improve diet quality by using the plate method as a guide for meal planning to include lean protein/plant protein, fruits, vegetables, whole grains, nonfat dairy as part of a well-balanced diet.    Personal Goal #2 Patient to limit sodium intake 2300mg  per day.    Personal Goal #3 Patient to identify strategies for weight loss of 0.5-2.0# per week.    Comments Patient is in the contemplative stage of change. Claudia [name] has medical history of CHF, pulmonary HTN, aortic athersclerosis, DM2. Per documentation on 12/11/22, she is unable to take statins and declined zetia. She reports that she is motivated to lose weight and improve blood sugar control; she continues ozempic. She monitors weight, fluid intake, and sodium intake daily. She is up 7.3# since starting with our program. She is under considerable amount of financial strain as she awaits disability; this has impacted her eating habits/food choices. She has previously declinced referral to behavioral health counseling. She has good knowledge of sodium recommendations, food sources of sodium, etc and reading food labels for sodium. Claudia [name] will benefit from participation in pulmonary rehab for nutrition, exercise, and lifestyle modification.      Intervention Plan   Intervention Prescribe, educate and counsel regarding individualized specific dietary modifications aiming towards targeted core components such as weight, hypertension, lipid management, diabetes, heart failure and other comorbidities.;Nutrition handout(s) given to patient.    Expected Outcomes Short Term Goal: Understand basic principles of dietary content, such  as calories, fat, sodium, cholesterol and nutrients.;Long Term Goal: Adherence to prescribed nutrition plan.             Nutrition Assessments:  MEDIFICTS Score Key: ?70 Need to make dietary changes  40-70 Heart Healthy Diet ? 40 Therapeutic Level Cholesterol Diet   Picture Your Plate Scores: <16 Unhealthy dietary pattern with much room for improvement. 41-50 Dietary pattern unlikely to meet recommendations for good health and room for improvement. 51-60 More healthful dietary pattern, with some room for improvement.  >60 Healthy dietary pattern, although there may be some specific behaviors that could be improved.    Nutrition Goals Re-Evaluation:  Nutrition Goals Re-Evaluation     Row Name 01/19/23 1438 02/18/23 1421 03/23/23 1429         Goals   Current Weight 242 lb 15.2 oz (110.2 kg) 247 lb 9.2 oz (112.3 kg) 249 lb 9 oz (113.2 kg)  weight from last attended session on 03/16/23     Comment A1c 6.9, LDL 140 no new labs; most recent labs A1c 6.9, LDL 140 no new labs; most recent labs A1c 6.9, LDL 140     Expected Outcome Letrice has medical history of CHF, pulmonary HTN, aortic athersclerosis, DM2. Per documentation on 12/11/22, she is unable to take statins and declined zetia. She reports that  she is motivated to lose weight and improve blood sugar control; she continues ozempic. She monitors weight, fluid intake, and sodium intake daily. Claudia [name] will benefit from participation in pulmonary rehab for nutrition, exercise, and lifestyle modification. Goals in action. Claudia [name] has medical history of CHF, pulmonary HTN, aortic athersclerosis, DM2. Per documentation on 12/11/22, she is unable to take statins and declined zetia. She reports that she is motivated to lose weight and improve blood sugar control; she continues ozempic. She monitors weight, fluid intake, and sodium intake daily. Her weight is up today; however, she acknowledges slightly different eating patterns/higher sodium over  the last few days. She has good knowledge of sodium recommendations, food sources of sodium, etc and reading food labels for sodium. Zaylynn will benefit from participation in pulmonary rehab for nutrition, exercise, and lifestyle modification. Patient is in the contemplative stage of change. Claudia [name] has medical history of CHF, pulmonary HTN, aortic athersclerosis, DM2. Per documentation on 12/11/22, she is unable to take statins and declined zetia. She reports that she is motivated to lose weight and improve blood sugar control; she continues ozempic. She monitors weight, fluid intake, and sodium intake daily. She is up 7.3# since starting with our program. She is under considerable amount of financial strain as she awaits disability; this has impacted her eating habits/food choices. She has previously declinced referral to behavioral health counseling. She has good knowledge of sodium recommendations, food sources of sodium, etc and reading food labels for sodium. Claudia [name] will benefit from participation in pulmonary rehab for nutrition, exercise, and lifestyle modification.              Nutrition Goals Discharge (Final Nutrition Goals Re-Evaluation):  Nutrition Goals Re-Evaluation - 03/23/23 1429       Goals   Current Weight 249 lb 9 oz (113.2 kg)   weight from last attended session on 03/16/23   Comment no new labs; most recent labs A1c 6.9, LDL 140    Expected Outcome Patient is in the contemplative stage of change. Claudia [name] has medical history of CHF, pulmonary HTN, aortic athersclerosis, DM2. Per documentation on 12/11/22, she is unable to take statins and declined zetia. She reports that she is motivated to lose weight and improve blood sugar control; she continues ozempic. She monitors weight, fluid intake, and sodium intake daily. She is up 7.3# since starting with our program. She is under considerable amount of financial strain as she awaits disability; this has impacted her eating habits/food  choices. She has previously declinced referral to behavioral health counseling. She has good knowledge of sodium recommendations, food sources of sodium, etc and reading food labels for sodium. Claudia [name] will benefit from participation in pulmonary rehab for nutrition, exercise, and lifestyle modification.             Psychosocial: Target Goals: Acknowledge presence or absence of significant depression and/or stress, maximize coping skills, provide positive support system. Participant is able to verbalize types and ability to use techniques and skills needed for reducing stress and depression.  Initial Review & Psychosocial Screening:  Initial Psych Review & Screening - 01/13/23 0949       Initial Review   Current issues with Current Stress Concerns    Source of Stress Concerns Chronic Illness;Retirement/disability    Comments Pt is currently waiting disability. Offered referral to therapy and/or medications as pt declined. Pt feels she has a good support system to help alleviate some stress.      Family Dynamics   Good Support System?  Yes    Comments spouse and children      Barriers   Psychosocial barriers to participate in program The patient should benefit from training in stress management and relaxation.      Screening Interventions   Interventions Encouraged to exercise;To provide support and resources with identified psychosocial needs    Expected Outcomes Short Term goal: Utilizing psychosocial counselor, staff and physician to assist with identification of specific Stressors or current issues interfering with healing process. Setting desired goal for each stressor or current issue identified.;Long Term Goal: Stressors or current issues are controlled or eliminated.;Short Term goal: Identification and review with participant of any Quality of Life or Depression concerns found by scoring the questionnaire.;Long Term goal: The participant improves quality of Life and PHQ9 Scores as  seen by post scores and/or verbalization of changes             Quality of Life Scores:  Scores of 19 and below usually indicate a poorer quality of life in these areas.  A difference of  2-3 points is a clinically meaningful difference.  A difference of 2-3 points in the total score of the Quality of Life Index has been associated with significant improvement in overall quality of life, self-image, physical symptoms, and general health in studies assessing change in quality of life.  PHQ-9: Review Flowsheet  More data exists      04/06/2023 01/13/2023 12/11/2022 02/03/2022 08/25/2021  Depression screen PHQ 2/9  Decreased Interest 1 0 0 0 0  Down, Depressed, Hopeless 2 1 0 0 0  PHQ - 2 Score 3 1 0 0 0  Altered sleeping 2 1 - - -  Tired, decreased energy 1 1 - - -  Change in appetite 2 1 - - -  Feeling bad or failure about yourself  2 1 - - -  Trouble concentrating 1 0 - - -  Moving slowly or fidgety/restless 1 0 - - -  Suicidal thoughts 0 0 - - -  PHQ-9 Score 12 5 - - -  Difficult doing work/chores - Somewhat difficult - - -   Interpretation of Total Score  Total Score Depression Severity:  1-4 = Minimal depression, 5-9 = Mild depression, 10-14 = Moderate depression, 15-19 = Moderately severe depression, 20-27 = Severe depression   Psychosocial Evaluation and Intervention:  Psychosocial Evaluation - 01/13/23 0954       Psychosocial Evaluation & Interventions   Interventions Stress management education;Relaxation education;Encouraged to exercise with the program and follow exercise prescription    Comments Pt is stressed waiting on disability. She is remain positive throughout this stressfull situation.    Expected Outcomes For Avyn to participate in rehab free of psychosocial barriers.    Continue Psychosocial Services  Follow up required by staff             Psychosocial Re-Evaluation:  Psychosocial Re-Evaluation     Row Name 02/04/23 1557 03/03/23 0900 04/02/23  1104         Psychosocial Re-Evaluation   Current issues with Current Stress Concerns Current Stress Concerns Current Stress Concerns     Comments Claudia [name] is still waiting on disability and continues to be stressed by this. She is doing well with managing her stress. Claudia [name] is still waiting on disability. She states that this has been very stressful because she is trying not to lose her house. She states her husband has picked up extra jobs to help makeup for her not being able to work at  this time. Her mother has taken out a loan to give to Claudia [name] to help pay her rent until she gets her disability. Claudia [name] still refuses a referral to a therapist or medications. She has the support of her family and her pastor and she states "God 's got this". She also states that talking about her situation helps her feel better. Claudia [name] is still waiting on disability. She has tried to follow up, but still no word. She states that this has been very stressful because she is trying not to lose her house. On top of that her car recently broke down. Now she has to depend on rides from her mom or family. She states her husband has picked up extra jobs to help make up for her not being able to work. Her mother has taken out a loan for her to help pay her rent until she gets her disability. Claudia [name] still declines a referral to behavioral health for counseling.     Expected Outcomes For Claudia [name] to participate in PR free of any psychosocial barriers or concerns For Claudia [name] to participate in PR free of any psychosocial barriers or concerns For Claudia [name] to participate in PR free of any psychosocial barriers or concerns, to have decreased stress and a positive outlook     Interventions Encouraged to attend Pulmonary Rehabilitation for the exercise Encouraged to attend Pulmonary Rehabilitation for the exercise Encouraged to attend Pulmonary Rehabilitation for the exercise;Stress management education     Continue Psychosocial Services  Follow  up required by staff Follow up required by staff Follow up required by staff              Psychosocial Discharge (Final Psychosocial Re-Evaluation):  Psychosocial Re-Evaluation - 04/02/23 1104       Psychosocial Re-Evaluation   Current issues with Current Stress Concerns    Comments Claudia [name] is still waiting on disability. She has tried to follow up, but still no word. She states that this has been very stressful because she is trying not to lose her house. On top of that her car recently broke down. Now she has to depend on rides from her mom or family. She states her husband has picked up extra jobs to help make up for her not being able to work. Her mother has taken out a loan for her to help pay her rent until she gets her disability. Claudia [name] still declines a referral to behavioral health for counseling.    Expected Outcomes For Claudia [name] to participate in PR free of any psychosocial barriers or concerns, to have decreased stress and a positive outlook    Interventions Encouraged to attend Pulmonary Rehabilitation for the exercise;Stress management education    Continue Psychosocial Services  Follow up required by staff             Education: Education Goals: Education classes will be provided on a weekly basis, covering required topics. Participant will state understanding/return demonstration of topics presented.  Learning Barriers/Preferences:  Learning Barriers/Preferences - 01/13/23 0934       Learning Barriers/Preferences   Learning Barriers None    Learning Preferences None             Education Topics: Introduction to Pulmonary Rehab Group instruction provided by PowerPoint, verbal discussion, and written material to support subject matter. Instructor reviews what Pulmonary Rehab is, the purpose of the program, and how patients are referred.     Know Your Numbers Group instruction that is supported by a PowerPoint presentation. Instructor  discusses importance of  knowing and understanding resting, exercise, and post-exercise oxygen saturation, heart rate, and blood pressure. Oxygen saturation, heart rate, blood pressure, rating of perceived exertion, and dyspnea are reviewed along with a normal range for these values.    Exercise for the Pulmonary Patient Group instruction that is supported by a PowerPoint presentation. Instructor discusses benefits of exercise, core components of exercise, frequency, duration, and intensity of an exercise routine, importance of utilizing pulse oximetry during exercise, safety while exercising, and options of places to exercise outside of rehab.       MET Level  Group instruction provided by PowerPoint, verbal discussion, and written material to support subject matter. Instructor reviews what METs are and how to increase METs.  Flowsheet Row PULMONARY REHAB OTHER RESPIRATORY from 01/19/2023 in [facility]  Date 01/19/23  Educator EP  Instruction Review Code 1- Verbalizes Understanding       Pulmonary Medications Verbally interactive group education provided by instructor with focus on inhaled medications and proper administration.   Anatomy and Physiology of the Respiratory System Group instruction provided by PowerPoint, verbal discussion, and written material to support subject matter. Instructor reviews respiratory cycle and anatomical components of the respiratory system and their functions. Instructor also reviews differences in obstructive and restrictive respiratory diseases with examples of each.  Flowsheet Row PULMONARY REHAB OTHER RESPIRATORY from 03/11/2023 in [facility]  Date 03/11/23  Educator RT  Instruction Review Code 1- Verbalizes Understanding       Oxygen Safety Group instruction provided by PowerPoint, verbal discussion, and written material to support subject matter.  There is an overview of "What is Oxygen" and "Why do we need it".  Instructor also reviews how to create a safe environment for oxygen use, the importance of using oxygen as prescribed, and the risks of noncompliance. There is a brief discussion on traveling with oxygen and resources the patient may utilize.   Oxygen Use Group instruction provided by PowerPoint, verbal discussion, and written material to discuss how supplemental oxygen is prescribed and different types of oxygen supply systems. Resources for more information are provided.  Flowsheet Row PULMONARY REHAB OTHER RESPIRATORY from 01/21/2023 in [facility]  Date 01/21/23  Educator RT  Instruction Review Code 1- Verbalizes Understanding       Breathing Techniques Group instruction that is supported by demonstration and informational handouts. Instructor discusses the benefits of pursed lip and diaphragmatic breathing and detailed demonstration on how to perform both.     Risk Factor Reduction Group instruction that is supported by a PowerPoint presentation. Instructor discusses the definition of a risk factor, different risk factors for pulmonary disease, and how the heart and lungs work together.   MD Day A group question and answer session with a medical doctor that allows participants to ask questions that relate to their pulmonary disease state.   Nutrition for the Pulmonary Patient Group instruction provided by PowerPoint slides, verbal discussion, and written materials to support subject matter. The instructor gives an explanation and review of healthy diet recommendations, which includes a discussion on weight management, recommendations for fruit and vegetable consumption, as well as protein, fluid, caffeine, fiber, sodium, sugar, and alcohol. Tips for eating when patients are short of breath are discussed.    Other Education Group or individual verbal, written,  or video instructions that support the educational  goals of the pulmonary rehab program. Flowsheet Row PULMONARY REHAB OTHER RESPIRATORY from 02/25/2023 in [facility]  Date 02/25/23  Educator RN  Instruction Review Code 1- Verbalizes Understanding        Knowledge Questionnaire Score:  Knowledge Questionnaire Score - 04/06/23 1646       Knowledge Questionnaire Score   Post Score 16/18             Core Components/Risk Factors/Patient Goals at Admission:  Personal Goals and Risk Factors at Admission - 01/13/23 0926       Core Components/Risk Factors/Patient Goals on Admission    Weight Management Yes;Weight Loss    Intervention Weight Management: Develop a combined nutrition and exercise program designed to reach desired caloric intake, while maintaining appropriate intake of nutrient and fiber, sodium and fats, and appropriate energy expenditure required for the weight goal.;Weight Management: Provide education and appropriate resources to help participant work on and attain dietary goals.;Weight Management/Obesity: Establish reasonable short term and long term weight goals.;Obesity: Provide education and appropriate resources to help participant work on and attain dietary goals.    Expected Outcomes Short Term: Continue to assess and modify interventions until short term weight is achieved;Long Term: Adherence to nutrition and physical activity/exercise program aimed toward attainment of established weight goal;Weight Maintenance: Understanding of the daily nutrition guidelines, which includes 25-35% calories from fat, 7% or less cal from saturated fats, less than 200mg  cholesterol, less than 1.5gm of sodium, & 5 or more servings of fruits and vegetables daily;Weight Loss: Understanding of general recommendations for a balanced deficit meal plan, which promotes 1-2 lb weight loss per week and includes a negative energy balance of  [phone] kcal/d;Understanding recommendations for meals to include 15-35% energy as protein, 25-35% energy from fat, 35-60% energy from carbohydrates, less than 200mg  of dietary cholesterol, 20-35 gm of total fiber daily;Understanding of distribution of calorie intake throughout the day with the consumption of 4-5 meals/snacks    Improve shortness of breath with ADL's Yes    Intervention Provide education, individualized exercise plan and daily activity instruction to help decrease symptoms of SOB with activities of daily living.    Expected Outcomes Short Term: Improve cardiorespiratory fitness to achieve a reduction of symptoms when performing ADLs;Long Term: Be able to perform more ADLs without symptoms or delay the onset of symptoms             Core Components/Risk Factors/Patient Goals Review:   Goals and Risk Factor Review     Row Name 02/04/23 1601 03/03/23 0915 04/02/23 1109         Core Components/Risk Factors/Patient Goals Review   Personal Goals Review Weight Management/Obesity;Improve shortness of breath with ADL's;Develop more efficient breathing techniques such as purse lipped breathing and diaphragmatic breathing and practicing self-pacing with activity.;Diabetes Weight Management/Obesity;Improve shortness of breath with ADL's;Stress Weight Management/Obesity;Improve shortness of breath with ADL's;Stress     Review Pt is doing well in PR. She is very pleasant and enjoy's coming to class. She uses purse lip breathing when she gets SOB. She is not currently on any respiratory medications. She has not met her weight loss goals yet, but is working with the staff dietician to meet this goal. She is currently working with staff dietician on how to better control her blood sugar. Claudia [name] is able to report her perceived exertion and dyspnea scal when asked by staff. Claudia [name] is doing well in PR. She is very pleasant and enjoy's  coming to class. She has met her goal of developing more  efficient breathing techniques. She still get's short of breath with ADL's, but uses her purse lip breathing to get through the activities. She is still working with staff dietician to achieve her weight loss goals. Claudia [name] is currently dealing with financial burdens which has caused her to be stressed. Staff will educate Claudia [name] on healthy ways to manage stress. Claudia [name] will benefit from participation in PR for nutrition, exercise and lifestyle modification. Goal progressing for weight loss. Claudia [name]'s weight is up due to stress eating. Currently she is dealing with financial burden. She is still working with our dietician for weight loss. Goal in progress on improving her shortness of breath with ADLs. Claudia [name] gets short of breath with strenuous tasks but knows how to check her oxygen saturation and pace herself. Claudia [name] will benefit from participation in PR for nutrition, education, exercise and lifestyle modification.     Expected Outcomes See admission goals See admission goals See admission goals              Core Components/Risk Factors/Patient Goals at Discharge (Final Review):   Goals and Risk Factor Review - 04/02/23 1109       Core Components/Risk Factors/Patient Goals Review   Personal Goals Review Weight Management/Obesity;Improve shortness of breath with ADL's;Stress    Review Goal progressing for weight loss. Claudia [name]'s weight is up due to stress eating. Currently she is dealing with financial burden. She is still working with our dietician for weight loss. Goal in progress on improving her shortness of breath with ADLs. Claudia [name] gets short of breath with strenuous tasks but knows how to check her oxygen saturation and pace herself. Claudia [name] will benefit from participation in PR for nutrition, education, exercise and lifestyle modification.    Expected Outcomes See admission goals             ITP Comments: Pt is making expected progress toward Pulmonary Rehab goals after completing 12  sessions. Recommend continued exercise, life style modification, education, and utilization of breathing techniques to increase stamina and strength, while also decreasing shortness of breath with exertion.  Dr. Mechele Collin is Medical Director for Pulmonary Rehab at [facility].

## 2023-04-08 ENCOUNTER — Encounter (HOSPITAL_COMMUNITY)
Admission: RE | Admit: 2023-04-08 | Discharge: 2023-04-08 | Disposition: A | Discharge status: Home / Self Care | Payer: BLUE CROSS/BLUE SHIELD | Source: Ambulatory Visit | Attending: Internal Medicine | Admitting: Internal Medicine

## 2023-04-08 DIAGNOSIS — I272 Pulmonary hypertension, unspecified: Secondary | ICD-10-CM | POA: Diagnosis not present

## 2023-04-08 NOTE — Progress Notes (Addendum)
Daily Session Note  Patient Details  Name: Claudia [name] MRN: [id number] Date of Birth: Feb 14, 1966 Referring Provider:   Doristine Devoid Pulmonary Rehab Walk Test from 01/13/2023 in [facility]  Referring Provider Bensimhon       Encounter Date: 04/08/2023  Check In:  Session Check In - 04/08/23 1412       Check-In   Supervising physician immediately available to respond to emergencies [facility] MD immediately available    Physician(s) Micah Flesher, NP    Location [facility]    Staff Present Samantha Belarus, RD, Dutch Gray, RN, BSN;Jaikob Borgwardt BS, ACSM-CEP, Exercise Physiologist;Jetta Dan Humphreys BS, ACSM-CEP, Exercise Physiologist;Kaylee Earlene Plater, MS, ACSM-CEP, Exercise Physiologist;Casey Katrinka Blazing, RT    Virtual Visit No    Medication changes reported     No    Fall or balance concerns reported    No    Tobacco Cessation No Change    Warm-up and Cool-down Performed as group-led instruction    Resistance Training Performed Yes    VAD Patient? No    PAD/SET Patient? No      Pain Assessment   Currently in Pain? No/denies    Multiple Pain Sites No             Capillary Blood Glucose: No results found for this or any previous visit (from the past 24 hour(s)).    Social History   Tobacco Use  Smoking Status Never  Smokeless Tobacco Never    Goals Met:  Independence with exercise equipment Exercise tolerated well No report of concerns or symptoms today Strength training completed today  Goals Unmet:  Not Applicable  Comments: Pt completed 6 min walk test and graduated. Successful in program.  Service time is from 1315 to 1445.    Dr. Mechele Collin is Medical Director for Pulmonary Rehab at [facility].

## 2023-04-08 NOTE — Progress Notes (Signed)
Discharge Progress Report  Patient Details  Name: Claudia [name] MRN: [id number] Date of Birth: 02/11/1966 Referring Provider:   Doristine Devoid Pulmonary Rehab Walk Test from 01/13/2023 in [facility]  Referring Provider Bensimhon        Number of Visits: 13  Reason for Discharge:  Patient reached a stable level of exercise. Patient independent in their exercise. Patient has met program and personal goals.  Smoking History:  Social History   Tobacco Use  Smoking Status Never  Smokeless Tobacco Never    Diagnosis:  Pulmonary hypertension (HCC)  ADL [facility]SD:  Pulmonary Assessment Scores     Row Name 01/13/23 0923 04/06/23 1644 04/08/23 1512     ADL [facility]SD   ADL Phase Entry Exit Exit   SOB Score total 38 52 --     CAT Score   CAT Score 8 13 --     mMRC Score   mMRC Score 1 -- 4            Initial Exercise Prescription:  Initial Exercise Prescription - 01/13/23 1100       Date of Initial Exercise RX and Referring Provider   Date 01/13/23    Referring Provider Bensimhon    Expected Discharge Date 04/08/23      Oxygen   Oxygen Continuous    Liters 2    Maintain Oxygen Saturation 88% or higher      Recumbant Bike   Level 1    Watts 20    Minutes 15    METs 1.5      NuStep   Level 1    SPM 60    Minutes 15    METs 1      Prescription Details   Frequency (times per week) 2    Duration Progress to 30 minutes of continuous aerobic without signs/symptoms of physical distress      Intensity   THRR 40-80% of Max Heartrate 66-132    Ratings of Perceived Exertion 11-13    Perceived Dyspnea 0-4      Progression   Progression Continue to progress workloads to maintain intensity without signs/symptoms of physical distress.      Resistance Training   Training Prescription Yes    Weight red bands    Reps 10-15             Discharge Exercise Prescription (Final Exercise Prescription  Changes):  Exercise Prescription Changes - 04/06/23 1500       Response to Exercise   Blood Pressure (Admit) 110/80    Blood Pressure (Exercise) 140/74    Blood Pressure (Exit) 108/70    Heart Rate (Admit) 95 bpm    Heart Rate (Exercise) 129 bpm    Heart Rate (Exit) 97 bpm    Oxygen Saturation (Admit) 100 %    Oxygen Saturation (Exercise) 94 %    Oxygen Saturation (Exit) 94 %    Rating of Perceived Exertion (Exercise) 9    Perceived Dyspnea (Exercise) 2    Duration Continue with 30 min of aerobic exercise without signs/symptoms of physical distress.    Intensity THRR unchanged      Progression   Progression Continue to progress workloads to maintain intensity without signs/symptoms of physical distress.      Resistance Training   Training Prescription Yes    Weight red bands    Reps 10-15    Time 10 Minutes      Oxygen  Oxygen Continuous    Liters 3      NuStep   Level 5    Minutes 15    METs 2.3      Track   Laps 9    Minutes 15    METs 2.38      Oxygen   Maintain Oxygen Saturation 88% or higher             Functional Capacity:  6 Minute Walk     Row Name 01/13/23 1124 04/08/23 1503       6 Minute Walk   Phase Initial Discharge    Distance 650 feet 1094 feet    Distance % Change -- 68.31 %    Distance Feet Change -- 444 ft    Walk Time 6 minutes 6 minutes    # of Rest Breaks 1  3:56-4:18 0    MPH 0.62 2.07    METS 2.25 3.03    RPE 11 9    Perceived Dyspnea  0 1    VO2 Peak 7.87 10.61    Symptoms Yes (comment) No    Comments Dizziness. Resolved after drinking 8 oz of H2O --    Resting HR 94 bpm 96 bpm    Resting BP 105/73 106/68    Resting Oxygen Saturation  98 % 100 %    Exercise Oxygen Saturation  during 6 min walk 1.51 % 98 %    Max Ex. HR 119 bpm 129 bpm    Max Ex. BP 133/88 132/80    2 Minute Post BP 127/87 124/70      Interval HR   1 Minute HR 109 112    2 Minute HR 119 120    3 Minute HR 104 123    4 Minute HR 93 125    5  Minute HR 111 127    6 Minute HR 112 129    2 Minute Post HR 97 94    Interval Heart Rate? Yes --      Interval Oxygen   Interval Oxygen? Yes --    Baseline Oxygen Saturation % 98 % 100 %    1 Minute Oxygen Saturation % 99 % 100 %    1 Minute Liters of Oxygen 2 L 3 L    2 Minute Oxygen Saturation % 98 % 100 %    2 Minute Liters of Oxygen 2 L 3 L    3 Minute Oxygen Saturation % 99 % 100 %    3 Minute Liters of Oxygen 2 L 3 L    4 Minute Oxygen Saturation % 96 % 100 %    4 Minute Liters of Oxygen 2 L 3 L    5 Minute Oxygen Saturation % 98 % 100 %    5 Minute Liters of Oxygen 2 L 3 L    6 Minute Oxygen Saturation % 99 % 98 %    6 Minute Liters of Oxygen 2 L 3 L    2 Minute Post Oxygen Saturation % 99 % 100 %    2 Minute Post Liters of Oxygen 2 L 3 L             Psychological, QOL, Others - Outcomes: PHQ 2/9:    04/06/2023    4:46 PM 01/13/2023    9:18 AM 12/11/2022    2:59 PM 02/03/2022    1:52 PM 08/25/2021    8:34 AM  Depression screen PHQ 2/9  Decreased Interest 1 0 0  0 0  Down, Depressed, Hopeless 2 1 0 0 0  PHQ - 2 Score 3 1 0 0 0  Altered sleeping 2 1     Tired, decreased energy 1 1     Change in appetite 2 1     Feeling bad or failure about yourself  2 1     Trouble concentrating 1 0     Moving slowly or fidgety/restless 1 0     Suicidal thoughts 0 0     PHQ-9 Score 12 5     Difficult doing work/chores  Somewhat difficult       Quality of Life:   Personal Goals: Goals established at orientation with interventions provided to work toward goal.  Personal Goals and Risk Factors at Admission - 01/13/23 0926       Core Components/Risk Factors/Patient Goals on Admission    Weight Management Yes;Weight Loss    Intervention Weight Management: Develop a combined nutrition and exercise program designed to reach desired caloric intake, while maintaining appropriate intake of nutrient and fiber, sodium and fats, and appropriate energy expenditure required for the  weight goal.;Weight Management: Provide education and appropriate resources to help participant work on and attain dietary goals.;Weight Management/Obesity: Establish reasonable short term and long term weight goals.;Obesity: Provide education and appropriate resources to help participant work on and attain dietary goals.    Expected Outcomes Short Term: Continue to assess and modify interventions until short term weight is achieved;Long Term: Adherence to nutrition and physical activity/exercise program aimed toward attainment of established weight goal;Weight Maintenance: Understanding of the daily nutrition guidelines, which includes 25-35% calories from fat, 7% or less cal from saturated fats, less than 200mg  cholesterol, less than 1.5gm of sodium, & 5 or more servings of fruits and vegetables daily;Weight Loss: Understanding of general recommendations for a balanced deficit meal plan, which promotes 1-2 lb weight loss per week and includes a negative energy balance of [phone] kcal/d;Understanding recommendations for meals to include 15-35% energy as protein, 25-35% energy from fat, 35-60% energy from carbohydrates, less than 200mg  of dietary cholesterol, 20-35 gm of total fiber daily;Understanding of distribution of calorie intake throughout the day with the consumption of 4-5 meals/snacks    Improve shortness of breath with ADL's Yes    Intervention Provide education, individualized exercise plan and daily activity instruction to help decrease symptoms of SOB with activities of daily living.    Expected Outcomes Short Term: Improve cardiorespiratory fitness to achieve a reduction of symptoms when performing ADLs;Long Term: Be able to perform more ADLs without symptoms or delay the onset of symptoms              Personal Goals Discharge:  Goals and Risk Factor Review     Row Name 02/04/23 1601 03/03/23 0915 04/02/23 1109         Core Components/Risk Factors/Patient Goals Review   Personal  Goals Review Weight Management/Obesity;Improve shortness of breath with ADL's;Develop more efficient breathing techniques such as purse lipped breathing and diaphragmatic breathing and practicing self-pacing with activity.;Diabetes Weight Management/Obesity;Improve shortness of breath with ADL's;Stress Weight Management/Obesity;Improve shortness of breath with ADL's;Stress     Review Pt is doing well in PR. She is very pleasant and enjoy's coming to class. She uses purse lip breathing when she gets SOB. She is not currently on any respiratory medications. She has not met her weight loss goals yet, but is working with the staff dietician to meet this goal. She is currently working with staff  dietician on how to better control her blood sugar. Claudia [name] is able to report her perceived exertion and dyspnea scal when asked by staff. Claudia [name] is doing well in PR. She is very pleasant and enjoy's coming to class. She has met her goal of developing more efficient breathing techniques. She still get's short of breath with ADL's, but uses her purse lip breathing to get through the activities. She is still working with staff dietician to achieve her weight loss goals. Claudia [name] is currently dealing with financial burdens which has caused her to be stressed. Staff will educate Claudia [name] on healthy ways to manage stress. Claudia [name] will benefit from participation in PR for nutrition, exercise and lifestyle modification. Goal progressing for weight loss. Claudia [name]'s weight is up due to stress eating. Currently she is dealing with financial burden. She is still working with our dietician for weight loss. Goal in progress on improving her shortness of breath with ADLs. Claudia [name] gets short of breath with strenuous tasks but knows how to check her oxygen saturation and pace herself. Claudia [name] will benefit from participation in PR for nutrition, education, exercise and lifestyle modification.     Expected Outcomes See admission goals See admission goals  See admission goals              Exercise Goals and Review:  Exercise Goals     Row Name 01/13/23 0920 02/05/23 0916 03/03/23 1356 03/30/23 0931       Exercise Goals   Increase Physical Activity Yes Yes Yes Yes    Intervention Provide advice, education, support and counseling about physical activity/exercise needs.;Develop an individualized exercise prescription for aerobic and resistive training based on initial evaluation findings, risk stratification, comorbidities and participant's personal goals. Provide advice, education, support and counseling about physical activity/exercise needs.;Develop an individualized exercise prescription for aerobic and resistive training based on initial evaluation findings, risk stratification, comorbidities and participant's personal goals. Provide advice, education, support and counseling about physical activity/exercise needs.;Develop an individualized exercise prescription for aerobic and resistive training based on initial evaluation findings, risk stratification, comorbidities and participant's personal goals. Provide advice, education, support and counseling about physical activity/exercise needs.;Develop an individualized exercise prescription for aerobic and resistive training based on initial evaluation findings, risk stratification, comorbidities and participant's personal goals.    Expected Outcomes Short Term: Attend rehab on a regular basis to increase amount of physical activity.;Long Term: Add in home exercise to make exercise part of routine and to increase amount of physical activity.;Long Term: Exercising regularly at least 3-5 days a week. Short Term: Attend rehab on a regular basis to increase amount of physical activity.;Long Term: Add in home exercise to make exercise part of routine and to increase amount of physical activity.;Long Term: Exercising regularly at least 3-5 days a week. Short Term: Attend rehab on a regular basis to increase  amount of physical activity.;Long Term: Add in home exercise to make exercise part of routine and to increase amount of physical activity.;Long Term: Exercising regularly at least 3-5 days a week. Short Term: Attend rehab on a regular basis to increase amount of physical activity.;Long Term: Add in home exercise to make exercise part of routine and to increase amount of physical activity.;Long Term: Exercising regularly at least 3-5 days a week.    Increase Strength and Stamina Yes Yes Yes Yes    Intervention Provide advice, education, support and counseling about physical activity/exercise needs.;Develop an individualized exercise prescription for aerobic and resistive training based on initial evaluation findings, risk stratification, comorbidities  and participant's personal goals. Provide advice, education, support and counseling about physical activity/exercise needs.;Develop an individualized exercise prescription for aerobic and resistive training based on initial evaluation findings, risk stratification, comorbidities and participant's personal goals. Provide advice, education, support and counseling about physical activity/exercise needs.;Develop an individualized exercise prescription for aerobic and resistive training based on initial evaluation findings, risk stratification, comorbidities and participant's personal goals. Provide advice, education, support and counseling about physical activity/exercise needs.;Develop an individualized exercise prescription for aerobic and resistive training based on initial evaluation findings, risk stratification, comorbidities and participant's personal goals.    Expected Outcomes Short Term: Increase workloads from initial exercise prescription for resistance, speed, and METs.;Short Term: Perform resistance training exercises routinely during rehab and add in resistance training at home;Long Term: Improve cardiorespiratory fitness, muscular endurance and strength  as measured by increased METs and functional capacity ( ) Short Term: Increase workloads from initial exercise prescription for resistance, speed, and METs.;Short Term: Perform resistance training exercises routinely during rehab and add in resistance training at home;Long Term: Improve cardiorespiratory fitness, muscular endurance and strength as measured by increased METs and functional capacity ( ) Short Term: Increase workloads from initial exercise prescription for resistance, speed, and METs.;Short Term: Perform resistance training exercises routinely during rehab and add in resistance training at home;Long Term: Improve cardiorespiratory fitness, muscular endurance and strength as measured by increased METs and functional capacity ( ) Short Term: Increase workloads from initial exercise prescription for resistance, speed, and METs.;Short Term: Perform resistance training exercises routinely during rehab and add in resistance training at home;Long Term: Improve cardiorespiratory fitness, muscular endurance and strength as measured by increased METs and functional capacity ( )    Able to understand and use rate of perceived exertion (RPE) [name] Yes Yes Yes Yes    Intervention Provide education and explanation on how to use RPE [name] Provide education and explanation on how to use RPE [name] Provide education and explanation on how to use RPE [name] Provide education and explanation on how to use RPE [name]    Expected Outcomes Short Term: Able to use RPE daily in rehab to express subjective intensity level;Long Term:  Able to use RPE to guide intensity level when exercising independently Short Term: Able to use RPE daily in rehab to express subjective intensity level;Long Term:  Able to use RPE to guide intensity level when exercising independently Short Term: Able to use RPE daily in rehab to express subjective intensity level;Long Term:  Able to use RPE to guide intensity level when exercising  independently Short Term: Able to use RPE daily in rehab to express subjective intensity level;Long Term:  Able to use RPE to guide intensity level when exercising independently    Able to understand and use Dyspnea [name] Yes Yes Yes Yes    Intervention Provide education and explanation on how to use Dyspnea [name] Provide education and explanation on how to use Dyspnea [name] Provide education and explanation on how to use Dyspnea [name] Provide education and explanation on how to use Dyspnea [name]    Expected Outcomes Short Term: Able to use Dyspnea [name] daily in rehab to express subjective sense of shortness of breath during exertion;Long Term: Able to use Dyspnea [name] to guide intensity level when exercising independently Short Term: Able to use Dyspnea [name] daily in rehab to express subjective sense of shortness of breath during exertion;Long Term: Able to use Dyspnea [name] to guide intensity level when exercising independently Short Term: Able to use Dyspnea [name] daily in rehab to express  subjective sense of shortness of breath during exertion;Long Term: Able to use Dyspnea [name] to guide intensity level when exercising independently Short Term: Able to use Dyspnea [name] daily in rehab to express subjective sense of shortness of breath during exertion;Long Term: Able to use Dyspnea [name] to guide intensity level when exercising independently    Knowledge and understanding of Target Heart Rate Range (THRR) Yes Yes Yes Yes    Intervention Provide education and explanation of THRR including how the numbers were predicted and where they are located for reference Provide education and explanation of THRR including how the numbers were predicted and where they are located for reference Provide education and explanation of THRR including how the numbers were predicted and where they are located for reference Provide education and explanation of THRR including how the numbers were predicted and where they are  located for reference    Expected Outcomes Short Term: Able to state/look up THRR;Long Term: Able to use THRR to govern intensity when exercising independently;Short Term: Able to use daily as guideline for intensity in rehab Short Term: Able to state/look up THRR;Long Term: Able to use THRR to govern intensity when exercising independently;Short Term: Able to use daily as guideline for intensity in rehab Short Term: Able to state/look up THRR;Long Term: Able to use THRR to govern intensity when exercising independently;Short Term: Able to use daily as guideline for intensity in rehab Short Term: Able to state/look up THRR;Long Term: Able to use THRR to govern intensity when exercising independently;Short Term: Able to use daily as guideline for intensity in rehab    Understanding of Exercise Prescription Yes Yes Yes Yes    Intervention Provide education, explanation, and written materials on patient's individual exercise prescription Provide education, explanation, and written materials on patient's individual exercise prescription Provide education, explanation, and written materials on patient's individual exercise prescription Provide education, explanation, and written materials on patient's individual exercise prescription    Expected Outcomes Short Term: Able to explain program exercise prescription;Long Term: Able to explain home exercise prescription to exercise independently Short Term: Able to explain program exercise prescription;Long Term: Able to explain home exercise prescription to exercise independently Short Term: Able to explain program exercise prescription;Long Term: Able to explain home exercise prescription to exercise independently Short Term: Able to explain program exercise prescription;Long Term: Able to explain home exercise prescription to exercise independently             Exercise Goals Re-Evaluation:  Exercise Goals Re-Evaluation     Row Name 02/05/23 0916 03/03/23 1356  03/30/23 0931         Exercise Goal Re-Evaluation   Exercise Goals Review Increase Physical Activity;Able to understand and use Dyspnea [name];Understanding of Exercise Prescription;Increase Strength and Stamina;Knowledge and understanding of Target Heart Rate Range (THRR);Able to understand and use rate of perceived exertion (RPE) [name] Increase Physical Activity;Able to understand and use Dyspnea [name];Understanding of Exercise Prescription;Increase Strength and Stamina;Knowledge and understanding of Target Heart Rate Range (THRR);Able to understand and use rate of perceived exertion (RPE) [name] Increase Physical Activity;Able to understand and use Dyspnea [name];Understanding of Exercise Prescription;Increase Strength and Stamina;Knowledge and understanding of Target Heart Rate Range (THRR);Able to understand and use rate of perceived exertion (RPE) [name]     Comments Pt has completed 4 days of exercise. she missed the last session. She is exercising on the recumbent stepper for 15 min then walking the track for 15 min now. She is on level 2, 2.3 METs on the stepper  and 3.15 METs on the track with the walker. She is tolerating well, progressing. Claudia [name] performed the warmup and cooldown standing without limitations. Will progress as tolerated. Pt has completed 8 days of exercise, missing two sessions. She is exercising on the recumbent stepper for 15 min then walking the track for 15 min now. She is on level 3, 2.1 METs on the stepper and 2.54 METs on the track with the walker. She is tolerating well however has had a decrease in her METs. She has chronic back and leg pain but is motivated. Claudia [name] performs the warmup and cooldown standing without limitations. Will progress as tolerated. Pt has completed 10 days of exercise, missing 10 sessions for various reasons, the last being car trouble. She is scheduled to graduate next week. She is exercising on the recumbent stepper for 15 min then walking the track  for 15 min now. She is on level 4, 2.5 METs on the stepper and 2.54 METs on the track with the walker. She is tolerating well.. She has chronic back and leg pain but is motivated. Claudia [name] performs the warmup and cooldown standing without limitations.     Expected Outcomes Through rehab and exercising at home, patient will find it easier to carry out ADL's and will gain the confidence to establish an exercise routine at home. Through rehab and exercising at home, patient will find it easier to carry out ADL's and will gain the confidence to establish an exercise routine at home. Through rehab and exercising at home, patient will find it easier to carry out ADL's and will gain the confidence to establish an exercise routine at home.              Nutrition & Weight - Outcomes:    Nutrition:  Nutrition Therapy & Goals - 03/23/23 1429       Nutrition Therapy   Diet Heart Healthy/Carbohydrate Consistent diet      Personal Nutrition Goals   Nutrition Goal Patient to improve diet quality by using the plate method as a guide for meal planning to include lean protein/plant protein, fruits, vegetables, whole grains, nonfat dairy as part of a well-balanced diet.    Personal Goal #2 Patient to limit sodium intake 2300mg  per day.    Personal Goal #3 Patient to identify strategies for weight loss of 0.5-2.0# per week.    Comments Patient is in the contemplative stage of change. Claudia [name] has medical history of CHF, pulmonary HTN, aortic athersclerosis, DM2. Per documentation on 12/11/22, she is unable to take statins and declined zetia. She reports that she is motivated to lose weight and improve blood sugar control; she continues ozempic. She monitors weight, fluid intake, and sodium intake daily. She is up 7.3# since starting with our program. She is under considerable amount of financial strain as she awaits disability; this has impacted her eating habits/food choices. She has previously declinced referral  to behavioral health counseling. She has good knowledge of sodium recommendations, food sources of sodium, etc and reading food labels for sodium. Claudia [name] will benefit from participation in pulmonary rehab for nutrition, exercise, and lifestyle modification.      Intervention Plan   Intervention Prescribe, educate and counsel regarding individualized specific dietary modifications aiming towards targeted core components such as weight, hypertension, lipid management, diabetes, heart failure and other comorbidities.;Nutrition handout(s) given to patient.    Expected Outcomes Short Term Goal: Understand basic principles of dietary content, such as calories, fat, sodium, cholesterol and nutrients.;Long Term  Goal: Adherence to prescribed nutrition plan.             Nutrition Discharge:   Education Questionnaire Score:  Knowledge Questionnaire Score - 04/06/23 1646       Knowledge Questionnaire Score   Post Score 16/18             Goals reviewed with patient; copy given to patient.

## 2023-04-09 ENCOUNTER — Encounter (HOSPITAL_COMMUNITY): Payer: BLUE CROSS/BLUE SHIELD

## 2023-04-15 ENCOUNTER — Telehealth (HOSPITAL_COMMUNITY): Payer: Self-pay

## 2023-04-15 NOTE — Telephone Encounter (Signed)
Essie Hart called in reference of pt. She is requesting a script for pts oxygen to be added to her chart.She also requested the script to mention the amount she should be on for rest and exertion.   Needed for disability.

## 2023-04-15 NOTE — Telephone Encounter (Signed)
Called pt's PCP, Angus Seller, to follow up on oxygen Rx after sending her an in basket message.  Rory from office took message.

## 2023-04-17 DIAGNOSIS — I10 Essential (primary) hypertension: Secondary | ICD-10-CM | POA: Diagnosis not present

## 2023-04-17 DIAGNOSIS — G4733 Obstructive sleep apnea (adult) (pediatric): Secondary | ICD-10-CM | POA: Diagnosis not present

## 2023-04-19 ENCOUNTER — Telehealth (HOSPITAL_COMMUNITY): Payer: Self-pay

## 2023-04-19 ENCOUNTER — Other Ambulatory Visit: Payer: Self-pay | Admitting: Nurse Practitioner

## 2023-04-19 NOTE — Telephone Encounter (Signed)
LVM checking on patient

## 2023-04-20 ENCOUNTER — Telehealth: Payer: Self-pay | Admitting: *Deleted

## 2023-04-20 NOTE — Telephone Encounter (Signed)
Patient called to ask for a letter from her sleep doctor stating she is on oxygen and how much she is on for her disability asap.  She is on 4L during the night and 3 L during the day.

## 2023-04-21 ENCOUNTER — Other Ambulatory Visit (HOSPITAL_COMMUNITY): Payer: Self-pay

## 2023-04-21 ENCOUNTER — Telehealth (HOSPITAL_COMMUNITY): Payer: Self-pay

## 2023-04-21 NOTE — Telephone Encounter (Signed)
Called patient back and advised that Dr. Mayford Knife has only interpreted sleep apnea studies for patient and ordered equipment, she has never ordered O2 for patient and did not see patient during hospitalization when O2 was initiated. Patient states she will contact PCP to see if PCP can help fill out disability paperwork.

## 2023-04-21 NOTE — Telephone Encounter (Signed)
Patient Advocate Encounter  Prior authorization for Ozempic has been submitted and approved. Test billing returns $4 for 28 day supply.  Key: [id number] Effective: 04/07/2023 to 04/20/2024  Burnell Blanks, CPhT Rx Patient Advocate Phone: [phone]

## 2023-04-25 ENCOUNTER — Other Ambulatory Visit: Payer: Self-pay | Admitting: Nurse Practitioner

## 2023-04-25 DIAGNOSIS — I10 Essential (primary) hypertension: Secondary | ICD-10-CM

## 2023-04-26 ENCOUNTER — Ambulatory Visit (INDEPENDENT_AMBULATORY_CARE_PROVIDER_SITE_OTHER): Payer: BLUE CROSS/BLUE SHIELD | Admitting: Nurse Practitioner

## 2023-04-26 ENCOUNTER — Encounter: Payer: Self-pay | Admitting: Nurse Practitioner

## 2023-04-26 VITALS — BP 113/76 | HR 92 | Temp 97.0°F | Wt 250.2 lb

## 2023-04-26 DIAGNOSIS — E785 Hyperlipidemia, unspecified: Secondary | ICD-10-CM | POA: Diagnosis not present

## 2023-04-26 DIAGNOSIS — E1169 Type 2 diabetes mellitus with other specified complication: Secondary | ICD-10-CM

## 2023-04-26 DIAGNOSIS — E1159 Type 2 diabetes mellitus with other circulatory complications: Secondary | ICD-10-CM | POA: Diagnosis not present

## 2023-04-26 DIAGNOSIS — D649 Anemia, unspecified: Secondary | ICD-10-CM | POA: Diagnosis not present

## 2023-04-26 DIAGNOSIS — I10 Essential (primary) hypertension: Secondary | ICD-10-CM

## 2023-04-26 LAB — POCT GLYCOSYLATED HEMOGLOBIN (HGB A1C): Hemoglobin A1C: 5.7 % — AB (ref 4.0–5.6)

## 2023-04-26 MED ORDER — FUROSEMIDE 80 MG PO TABS: 80.0000 mg | ORAL_TABLET | Freq: Two times a day (BID) | ORAL | 0 refills | Status: DC

## 2023-04-26 MED ORDER — AMLODIPINE BESYLATE 5 MG PO TABS
5.0000 mg | ORAL_TABLET | Freq: Every day | ORAL | 0 refills | Status: DC
Start: 2023-04-26 — End: 2023-12-13

## 2023-04-26 MED ORDER — FERROUS SULFATE 325 (65 FE) MG PO TABS
325.0000 mg | ORAL_TABLET | Freq: Every day | ORAL | 2 refills | Status: AC
Start: 2023-04-26 — End: ?

## 2023-04-26 MED ORDER — METFORMIN HCL 1000 MG PO TABS: 1000.0000 mg | ORAL_TABLET | Freq: Two times a day (BID) | ORAL | 1 refills | Status: DC

## 2023-04-26 MED ORDER — DAPAGLIFLOZIN PROPANEDIOL 10 MG PO TABS: 10.0000 mg | ORAL_TABLET | Freq: Every day | ORAL | 0 refills | Status: DC

## 2023-04-26 MED ORDER — OZEMPIC (2 MG/DOSE) 8 MG/3ML ~~LOC~~ SOPN: 2.0000 mg | PEN_INJECTOR | SUBCUTANEOUS | 11 refills | Status: DC

## 2023-04-26 NOTE — Patient Instructions (Signed)
1. Type 2 diabetes mellitus with hyperlipidemia (HCC)  - POCT glycosylated hemoglobin (Hb A1C)  2. Type 2 diabetes mellitus with other circulatory complication, unspecified whether long term insulin use (HCC)  - metFORMIN (GLUCOPHAGE) 1000 MG tablet; Take 1 tablet (1,000 mg total) by mouth 2 (two) times daily with a meal.  Dispense: 180 tablet; Refill: 1  3. Anemia, unspecified type  - ferrous sulfate 325 (65 FE) MG tablet; Take 1 tablet (325 mg total) by mouth daily with breakfast.  Dispense: 30 tablet; Refill: 2  4. Essential hypertension  - amLODipine (NORVASC) 5 MG tablet; Take 1 tablet (5 mg total) by mouth daily.  Dispense: 180 tablet; Refill: 0   Follow up:  Follow up in 3 months

## 2023-04-26 NOTE — Progress Notes (Signed)
@Patient  ID: Claudia [name], female    DOB: [birth year], 57 y.o.   MRN: [id number]  Chief Complaint  Patient presents with   Diabetes    Referring provider: Ivonne Andrew, NP   HPI  CHF: Patient is on Lasix 80 mg BID and has follow up with cardiology. Patient continues on 3L O2 daily and 4L at night with CPAP. Patient does have OSA.  Has been participating in pulmonary rehab.   Diabetes: Hx of poorly controlled diabetes. A1c 9.3% in 07/2022. A1C in office today is 5.7. Taking farxiga 10 mg daily, ozempic, metformin 1000 mg BID. CBGs were mostly within goal. Patient no longer taking glipizide.    Aortic atherosclerosis noted on CT On aspirin 81 mg daily. Has prior allergies to pravastatin, rosuvastatin, and atorvastatin. Lipid panel here WNL w/ exception of LDL 140. Consulted with cardiology. Continues on fish oil currently.    IDA Iron panel from 3 months ago w/ normal iron, elevated TIBC. On Ferrous sulfate 325 mg daily at home.    Denies f/c/s, n/v/d, hemoptysis, PND, leg swelling Denies chest pain or edema      Allergies  Allergen Reactions   Beef-Derived Products     Patient does not eat beef   Crestor [Rosuvastatin]     MUSCLE ACHES HEADACHES    Diltiazem Hives   Hydrocodone Itching   Other Hives, Swelling and Other (See Comments)    Vision changes; ALLERGIC TO ALL NUTS EXCEPT PEANUTS   Oxycodone Itching   Pork-Derived Products     For dietary, patient does not eat pork   Pravachol [Pravastatin Sodium] Itching    Joint pain   Simvastatin Hives    Immunization History  Administered Date(s) Administered   Pneumococcal Polysaccharide-23 03/01/2017   Tdap 03/01/2017    Past Medical History:  Diagnosis Date   Diabetes mellitus    Fibroids    Headache(784.0)    History of echocardiogram    Echo 7/18: Moderate concentric LVH, EF 55-60, normal wall motion, grade 1 diastolic dysfunction, calcified aortic leaflets   Hypercholesterolemia     Hyperlipidemia    Hypertension    Osteoarthritis    , with mild meniscus tear, followed by orthopedics--Dr Darrelyn Hillock 04/29/10   Right knee pain    Seizures (HCC)    as child   STEMI (ST elevation myocardial infarction) (HCC)    Vitamin D deficiency     Tobacco History: Social History   Tobacco Use  Smoking Status Never  Smokeless Tobacco Never   Counseling given: Not Answered   Outpatient Encounter Medications as of 04/26/2023  Medication Sig   acetaminophen (TYLENOL) 500 MG tablet Take 500 mg by mouth every 6 (six) hours as needed for mild pain or moderate pain.   aspirin 81 MG EC tablet Take 1 tablet (81 mg total) by mouth daily.   blood glucose meter kit and supplies KIT Dispense based on patient and insurance preference. Use up to four times daily as directed. (FOR ICD-9 250.00, 250.01).   Blood Glucose Monitoring Suppl (ACCU-CHEK GUIDE) w/Device KIT 1 each by Does not apply route in the morning, at noon, in the evening, and at bedtime.   carvedilol (COREG) 3.125 MG tablet Take 1 tablet (3.125 mg total) by mouth 2 (two) times daily with a meal.   glucose blood (ACCU-CHEK GUIDE) test strip Use as instructed to check sugar 4 times daily.   Lancets (ACCU-CHEK MULTICLIX) lancets Use as instructed to check blood sugar 4 times daily.  Omega-3 Fatty Acids (FISH OIL) 1000 MG CAPS Take 1,000 mg by mouth daily.   potassium chloride SA (KLOR-CON M) 20 MEQ tablet Take 1 tablet (20 mEq total) by mouth daily.   sacubitril-valsartan (ENTRESTO) 97-103 MG Take 1 tablet by mouth 2 (two) times daily.   spironolactone (ALDACTONE) 25 MG tablet Take 1 tablet (25 mg total) by mouth daily.   [DISCONTINUED] amLODipine (NORVASC) 5 MG tablet Take 1 tablet by mouth once daily   [DISCONTINUED] dapagliflozin propanediol (FARXIGA) 10 MG TABS tablet Take 1 tablet by mouth once daily   [DISCONTINUED] ferrous sulfate 325 (65 FE) MG tablet Take 1 tablet (325 mg total) by mouth daily with breakfast.    [DISCONTINUED] furosemide (LASIX) 80 MG tablet Take 1 tablet by mouth twice daily   [DISCONTINUED] metFORMIN (GLUCOPHAGE) 1000 MG tablet TAKE 1 TABLET BY MOUTH TWICE DAILY WITH A MEAL   [DISCONTINUED] Semaglutide, 2 MG/DOSE, (OZEMPIC, 2 MG/DOSE,) 8 MG/3ML SOPN Inject 2 mg into the skin once a week.   amLODipine (NORVASC) 5 MG tablet Take 1 tablet (5 mg total) by mouth daily.   dapagliflozin propanediol (FARXIGA) 10 MG TABS tablet Take 1 tablet (10 mg total) by mouth daily.   ferrous sulfate 325 (65 FE) MG tablet Take 1 tablet (325 mg total) by mouth daily with breakfast.   furosemide (LASIX) 80 MG tablet Take 1 tablet (80 mg total) by mouth 2 (two) times daily.   metFORMIN (GLUCOPHAGE) 1000 MG tablet Take 1 tablet (1,000 mg total) by mouth 2 (two) times daily with a meal.   OVER THE COUNTER MEDICATION Take 1 capsule by mouth daily. Herbal Supplement  -- unknown name (Patient not taking: Reported on 04/26/2023)   Semaglutide, 2 MG/DOSE, (OZEMPIC, 2 MG/DOSE,) 8 MG/3ML SOPN Inject 2 mg into the skin once a week.   No facility-administered encounter medications on file as of 04/26/2023.     Review of Systems  Review of Systems  Constitutional: Negative.   HENT: Negative.    Cardiovascular: Negative.   Gastrointestinal: Negative.   Allergic/Immunologic: Negative.   Neurological: Negative.   Psychiatric/Behavioral: Negative.         Physical Exam  BP 113/76   Pulse 92   Temp (!) 97 F (36.1 C)   Wt 250 lb 3.2 oz (113.5 kg)   LMP 05/09/2012   SpO2 100%   BMI 38.04 kg/m   Wt Readings from Last 5 Encounters:  04/26/23 250 lb 3.2 oz (113.5 kg)  04/06/23 251 lb 5.2 oz (114 kg)  03/02/23 247 lb 2.2 oz (112.1 kg)  02/02/23 242 lb 15.2 oz (110.2 kg)  01/28/23 240 lb 6.4 oz (109 kg)     Physical Exam Vitals and nursing note reviewed.  Constitutional:      General: She is not in acute distress.    Appearance: She is well-developed.  Cardiovascular:     Rate and Rhythm: Normal  rate and regular rhythm.  Pulmonary:     Effort: Pulmonary effort is normal.     Breath sounds: Normal breath sounds.  Neurological:     Mental Status: She is alert and oriented to person, place, and time.      Lab Results:  CBC    Component Value Date/Time   WBC 3.8 (L) 12/05/2022 0046   RBC 6.96 (H) 12/05/2022 0046   HGB 14.7 12/05/2022 0046   HGB 13.2 09/02/2022 1402   HCT 51.3 (H) 12/05/2022 0046   HCT 45.5 09/02/2022 1402   PLT 337 12/05/2022  0046   PLT 367 09/02/2022 1402   MCV 73.7 (L) 12/05/2022 0046   MCV 71 (L) 09/02/2022 1402   MCH 21.1 (L) 12/05/2022 0046   MCHC 28.7 (L) 12/05/2022 0046   RDW 21.1 (H) 12/05/2022 0046   RDW 17.3 (H) 09/02/2022 1402   LYMPHSABS 1.9 12/02/2022 1924   LYMPHSABS 2.8 02/03/2022 1425   MONOABS 0.9 12/02/2022 1924   EOSABS 0.1 12/02/2022 1924   EOSABS 0.2 02/03/2022 1425   BASOSABS 0.0 12/02/2022 1924   BASOSABS 0.0 02/03/2022 1425    BMET    Component Value Date/Time   NA 135 01/18/2023 0857   NA 140 09/02/2022 1402   K 4.0 01/18/2023 0857   CL 91 (L) 01/18/2023 0857   CO2 33 (H) 01/18/2023 0857   GLUCOSE 95 01/18/2023 0857   BUN 15 01/18/2023 0857   BUN 6 09/02/2022 1402   CREATININE 0.81 01/18/2023 0857   CREATININE 0.74 07/07/2017 1557   CALCIUM 9.6 01/18/2023 0857   GFRNONAA >60 01/18/2023 0857   GFRNONAA 95 07/07/2017 1557   GFRAA 86 10/04/2020 1607   GFRAA 110 07/07/2017 1557    BNP    Component Value Date/Time   BNP 13.7 12/24/2022 0925   BNP 6.2 02/25/2017 1536    ProBNP    Component Value Date/Time   PROBNP 12 04/12/2017 0839   PROBNP 420.2 (H) 06/01/2012 1648    Imaging: MM 3D SCREENING MAMMOGRAM BILATERAL BREAST  Result Date: 03/31/2023 CLINICAL DATA:  Screening. EXAM: DIGITAL SCREENING BILATERAL MAMMOGRAM WITH TOMOSYNTHESIS AND CAD TECHNIQUE: Bilateral screening digital craniocaudal and mediolateral oblique mammograms were obtained. Bilateral screening digital breast tomosynthesis was  performed. The images were evaluated with computer-aided detection. COMPARISON:  Previous exam(s). ACR Breast Density Category b: There are scattered areas of fibroglandular density. FINDINGS: There are no findings suspicious for malignancy. IMPRESSION: No mammographic evidence of malignancy. A result letter of this screening mammogram will be mailed directly to the patient. RECOMMENDATION: Screening mammogram in one year. (Code:[id number]-B-01Y) BI-RADS CATEGORY  1: Negative. Electronically Signed   By: Amie Portland M.D.   On: 03/31/2023 10:39     Assessment & Plan:   Type 2 diabetes mellitus with hyperlipidemia (HCC) - POCT glycosylated hemoglobin (Hb A1C)  2. Type 2 diabetes mellitus with other circulatory complication, unspecified whether long term insulin use (HCC)  - metFORMIN (GLUCOPHAGE) 1000 MG tablet; Take 1 tablet (1,000 mg total) by mouth 2 (two) times daily with a meal.  Dispense: 180 tablet; Refill: 1  3. Anemia, unspecified type  - ferrous sulfate 325 (65 FE) MG tablet; Take 1 tablet (325 mg total) by mouth daily with breakfast.  Dispense: 30 tablet; Refill: 2  4. Essential hypertension  - amLODipine (NORVASC) 5 MG tablet; Take 1 tablet (5 mg total) by mouth daily.  Dispense: 180 tablet; Refill: 0   Follow up:  Follow up in 3 months     Ivonne Andrew, NP 04/26/2023

## 2023-04-26 NOTE — Assessment & Plan Note (Signed)
-   POCT glycosylated hemoglobin (Hb A1C)  2. Type 2 diabetes mellitus with other circulatory complication, unspecified whether long term insulin use (HCC)  - metFORMIN (GLUCOPHAGE) 1000 MG tablet; Take 1 tablet (1,000 mg total) by mouth 2 (two) times daily with a meal.  Dispense: 180 tablet; Refill: 1  3. Anemia, unspecified type  - ferrous sulfate 325 (65 FE) MG tablet; Take 1 tablet (325 mg total) by mouth daily with breakfast.  Dispense: 30 tablet; Refill: 2  4. Essential hypertension  - amLODipine (NORVASC) 5 MG tablet; Take 1 tablet (5 mg total) by mouth daily.  Dispense: 180 tablet; Refill: 0   Follow up:  Follow up in 3 months

## 2023-04-28 ENCOUNTER — Other Ambulatory Visit: Payer: Self-pay | Admitting: Nurse Practitioner

## 2023-04-28 DIAGNOSIS — D649 Anemia, unspecified: Secondary | ICD-10-CM

## 2023-04-28 MED ORDER — FOLIC ACID 800 MCG PO TABS: 400.0000 ug | ORAL_TABLET | Freq: Every day | ORAL | 2 refills | Status: AC

## 2023-04-28 MED ORDER — VITAMIN B-12 100 MCG PO TABS: 100.0000 ug | ORAL_TABLET | Freq: Every day | ORAL | 3 refills | Status: DC

## 2023-04-28 MED ORDER — FERROUS SULFATE 325 (65 FE) MG PO TABS
325.0000 mg | ORAL_TABLET | Freq: Every day | ORAL | 2 refills | Status: DC
Start: 2023-04-28 — End: 2023-11-29

## 2023-05-05 ENCOUNTER — Telehealth: Payer: Self-pay

## 2023-05-05 NOTE — Progress Notes (Signed)
   Care Guide Note  05/05/2023 Name: ARES GRODIN MRN: [id number] DOB: 23-Mar-1966  Referred by: Ivonne Andrew, NP Reason for referral : Care Management (Outreach to schedule with pharm d )   ALVADA OLANO is a 57 y.o. year old female who is a primary care patient of Ivonne Andrew, NP. Jaclynn Guarneri was referred to the pharmacist for assistance related to DM.    Successful contact was made with the patient to discuss pharmacy services including being ready for the pharmacist to call at least 5 minutes before the scheduled appointment time, to have medication bottles and any blood sugar or blood pressure readings ready for review. The patient agreed to meet with the pharmacist via with the pharmacist via telephone visit on (date/time).  06/09/2023  Penne Lash, RMA Care Guide [facility]  [place], [place] [address] Direct Dial: [phone] [email or web address]

## 2023-05-05 NOTE — Progress Notes (Signed)
   Care Guide Note  05/05/2023 Name: WENDOLYN FAGA MRN: [id number] DOB: 02-15-1966  Referred by: Ivonne Andrew, NP Reason for referral : Care Management (Outreach to schedule with pharm d )   BRYTANI DOHN is a 57 y.o. year old female who is a primary care patient of Ivonne Andrew, NP. Jaclynn Guarneri was referred to the pharmacist for assistance related to DM.    An unsuccessful telephone outreach was attempted today to contact the patient who was referred to the pharmacy team for assistance with medication management. Additional attempts will be made to contact the patient.   Penne Lash, RMA Care Guide [facility]  [place], [place] [address] Direct Dial: [phone] [email or web address]

## 2023-05-16 NOTE — Progress Notes (Signed)
Advanced Heart Failure [facility]   Primary Care:Angus Seller NP  Primary Cardiologist: Dr Anne Fu  HF MD: Dr Leory Plowman  HPI: Claudia [name] is a 57 y.o. with morbid obesity, HFpEF, NSVT, DMII, IDA, and HTN.   Cath 2019 for CP with normal cors   Admitted 11/23 with A/C HFpEF. Discharge weight 278 pounds.   She was seen HF TOC 08/26/22 and set up for RHC. (See below)  Sleep Study - Severe OSA. AHI 60 and nocturnal hypoxia.   RHC 09/16/22 - Mild mixed pulmonary with mildly elevated left-sided filling pressures.  Admitted 3/24 with a/c dHF. Diuresed and lasix increased to 80 bid. D/c weight 247  Echo 45% with septal HK D shaped ventricle  Zio 4/24 Sinus. 2.7% PVCs  Today she returns for HF follow up with her husband. Overall feeling OK. She is SOB walking uphill, does OK with ADLs if she takes her time. Wears 3L oxygen during the day. Struggles with balance and has positional dizziness. Denies palpitations, abnormal bleeding, CP, edema or PND/orthopnea. Weight at home 251-255 lbs. Taking all medications. Wears CPAP + 4L at night. Blood sugars improved.  Echo today 05/17/23 EF 60-65% small circumferential effusion. RV ok    Cardiac Testing  RHC 09/16/22  RA = 2 RV = 46/5 PA = 48/30 (37) PCW = 20 Fick cardiac output/index = 7.4/3.2 Thermo CO/CI = 8.7/3.8 PVR = 2.3 WU Ao sat = 93% PA sat = 70%, 71% 1. Mild mixed pulmonary with mildly elevated left-sided filling pressures Continue medical therapy and weight loss efforts. No role for selective pulmonary vasodilators.     Past Medical History:  Diagnosis Date   Diabetes mellitus    Fibroids    Headache(784.0)    History of echocardiogram    Echo 7/18: Moderate concentric LVH, EF 55-60, normal wall motion, grade 1 diastolic dysfunction, calcified aortic leaflets   Hypercholesterolemia    Hyperlipidemia    Hypertension    Osteoarthritis    , with mild meniscus tear, followed by orthopedics--Dr Darrelyn Hillock 04/29/10   Right knee pain     Seizures (HCC)    as child   STEMI (ST elevation myocardial infarction) (HCC)    Vitamin D deficiency     Current Outpatient Medications  Medication Sig Dispense Refill   acetaminophen (TYLENOL) 500 MG tablet Take 500 mg by mouth every 6 (six) hours as needed for mild pain or moderate pain.     amLODipine (NORVASC) 5 MG tablet Take 1 tablet (5 mg total) by mouth daily. 180 tablet 0   aspirin 81 MG EC tablet Take 1 tablet (81 mg total) by mouth daily. 30 tablet 6   blood glucose meter kit and supplies KIT Dispense based on patient and insurance preference. Use up to four times daily as directed. (FOR ICD-9 250.00, 250.01). 1 each 0   Blood Glucose Monitoring Suppl (ACCU-CHEK GUIDE) w/Device KIT 1 each by Does not apply route in the morning, at noon, in the evening, and at bedtime. 1 kit 0   carvedilol (COREG) 3.125 MG tablet Take 1 tablet (3.125 mg total) by mouth 2 (two) times daily with a meal. 60 tablet 8   dapagliflozin propanediol (FARXIGA) 10 MG TABS tablet Take 1 tablet (10 mg total) by mouth daily. 30 tablet 0   ferrous sulfate 325 (65 FE) MG tablet Take 1 tablet (325 mg total) by mouth daily with breakfast. 30 tablet 2   folic acid (FOLVITE) 800 MCG tablet Take 0.5 tablets (400 mcg total)  by mouth daily. 30 tablet 2   furosemide (LASIX) 80 MG tablet Take 1 tablet (80 mg total) by mouth 2 (two) times daily. 60 tablet 0   glucose blood (ACCU-CHEK GUIDE) test strip Use as instructed to check sugar 4 times daily. 200 each 6   Lancets (ACCU-CHEK MULTICLIX) lancets Use as instructed to check blood sugar 4 times daily. 204 each 12   metFORMIN (GLUCOPHAGE) 1000 MG tablet Take 1 tablet (1,000 mg total) by mouth 2 (two) times daily with a meal. 180 tablet 1   Omega-3 Fatty Acids (FISH OIL) 1000 MG CAPS Take 1,000 mg by mouth daily.     potassium chloride SA (KLOR-CON M) 20 MEQ tablet Take 1 tablet (20 mEq total) by mouth daily. 90 tablet 3   sacubitril-valsartan (ENTRESTO) 97-103 MG Take 1  tablet by mouth 2 (two) times daily. 60 tablet 7   Semaglutide, 2 MG/DOSE, (OZEMPIC, 2 MG/DOSE,) 8 MG/3ML SOPN Inject 2 mg into the skin once a week. 3 mL 11   spironolactone (ALDACTONE) 25 MG tablet Take 1 tablet (25 mg total) by mouth daily. 90 tablet 3   vitamin B-12 (CYANOCOBALAMIN) 100 MCG tablet Take 1 tablet (100 mcg total) by mouth daily. (Patient not taking: Reported on 05/17/2023) 30 tablet 3   No current facility-administered medications for this encounter.    Allergies  Allergen Reactions   Beef-Derived Products     Patient does not eat beef   Crestor [Rosuvastatin]     MUSCLE ACHES HEADACHES    Diltiazem Hives   Hydrocodone Itching   Other Hives, Swelling and Other (See Comments)    Vision changes; ALLERGIC TO ALL NUTS EXCEPT PEANUTS   Oxycodone Itching   Pork-Derived Products     For dietary, patient does not eat pork   Pravachol [Pravastatin Sodium] Itching    Joint pain   Simvastatin Hives      Social History   Socioeconomic History   Marital status: Married    Spouse name: Aneta Mins   Number of children: 6   Years of education: Not on file   Highest education level: High school graduate  Occupational History   Occupation: Nutrition @ [employer]    Comment: applying for disability  Tobacco Use   Smoking status: Never   Smokeless tobacco: Never  Vaping Use   Vaping status: Never Used  Substance and Sexual Activity   Alcohol use: No   Drug use: No   Sexual activity: Yes  Other Topics Concern   Not on file  Social History Narrative   Not on file   Social Determinants of Health   Financial Resource Strain: Medium Risk (08/14/2022)   Overall Financial Resource Strain (CARDIA)    Difficulty of Paying Living Expenses: Somewhat hard  Food Insecurity: No Food Insecurity (12/02/2022)   Hunger Vital Sign    Worried About Running Out of Food in the Last Year: Never true    Ran Out of Food in the Last Year: Never true  Transportation Needs: No  Transportation Needs (12/02/2022)   PRAPARE - Administrator, Civil Service (Medical): No    Lack of Transportation (Non-Medical): No  Physical Activity: Not on file  Stress: Not on file  Social Connections: Not on file  Intimate Partner Violence: Not At Risk (12/02/2022)   Humiliation, Afraid, Rape, and Kick questionnaire    Fear of Current or Ex-Partner: No    Emotionally Abused: No    Physically Abused: No  Sexually Abused: No      Family History  Problem Relation Age of Onset   Hypertension Mother    Diabetes Mother    Hypercholesterolemia Mother    Hypertension Sister    CVA Maternal Grandmother    Hypertension Maternal Grandmother    Diabetes Maternal Grandmother    Heart attack Neg Hx    Breast cancer Neg Hx    Wt Readings from Last 3 Encounters:  05/17/23 114.4 kg (252 lb 3.2 oz)  04/26/23 113.5 kg (250 lb 3.2 oz)  04/06/23 114 kg (251 lb 5.2 oz)   BP 118/70   Pulse 93   Wt 114.4 kg (252 lb 3.2 oz)   LMP 05/09/2012   SpO2 100% Comment: 3 l n/c  BMI 38.35 kg/m   PHYSICAL EXAM: General:  Well appearing. No resp difficulty, walked into clinic, wearing O2 HEENT: normal Neck: supple. no JVD. Carotids 2+ bilat; no bruits. No lymphadenopathy or thryomegaly appreciated. Cor: Regular rate & rhythm. No rubs, gallops or murmurs. Lungs: clear Abdomen: obese soft, nontender, nondistended. No hepatosplenomegaly. No bruits or masses. Good bowel sounds. Extremities: no cyanosis, clubbing, rash, edema Neuro: alert & orientedx3, cranial nerves grossly intact. moves all 4 extremities w/o difficulty. Affect pleasant  ASSESSMENT & PLAN:  1. Chronic HFmrEF and RV moderately reduced - Echo 2019 EF 55-60% with normal RV.  - Echo 07/2022 EF 55-60% & RV moderately reduced.  - Echo 3/24: EF 45% with septal HK D-shape septum - LHC 2019 normal cors - RHC 12/23 RA 2 PA 48/30 (37) PCW 20 Fick 7.4/3.2 Thermo 8.7/3.8 PVR 2.3 WU - Echo today 05/17/23 EF 60-65% small  circumfrential effusion. RV ok -> RV strain on echo no longer present   - Overall improved. NYHA III - Volume status improved.  - Continue lasix 80 bid. Watch for overdiuresis - Continue Entresto 97/103 bid - Continue spiro 25 daily  - Continue Farxiga 10 - Has not been on b-blocker with RV failure - Check labs  2. PAH, mild (post-capillary) - Evidence of RV strain on echo has now resolved with supplemental O2 and BiPAP - Suspect WHO GROUP 2 (HF) and 3 (OSA/OHS) - VQ scan April 2023 which was negative. - Continue BIPAP - Continue weight loss (on Ozempic) - Pulmonary rehab   3. Chronic hypoxic respiratory failure - Mostly due to OHS - Continue O2, bipap and weight loss - Pulmonary Rehab  4. HTN - Blood pressure well controlled. Continue current regimen.  5. Morbid Obesity  - Body mass index is 38.35 kg/m. - Continue Ozempic  6. DMII -08/12/22 Hgb A1C 9.3 - On Farxiga 10 mg daily.  -Needs follow up with PCP.  7. Sleep Apnea, severe - AHI 60 and nocturnal hypoxia.  - Continue BIPAP and weight loss efforts  8. PVCs, frequent - Zio 4/24 Sinus. 2.7% PVCs    Arvilla Meres MD 12:45 PM

## 2023-05-17 ENCOUNTER — Ambulatory Visit (HOSPITAL_COMMUNITY)
Admission: RE | Admit: 2023-05-17 | Discharge: 2023-05-17 | Disposition: A | Discharge status: Home / Self Care | Payer: BLUE CROSS/BLUE SHIELD | Source: Ambulatory Visit | Attending: Nurse Practitioner | Admitting: Nurse Practitioner

## 2023-05-17 ENCOUNTER — Ambulatory Visit (HOSPITAL_BASED_OUTPATIENT_CLINIC_OR_DEPARTMENT_OTHER)
Admission: RE | Admit: 2023-05-17 | Discharge: 2023-05-17 | Disposition: A | Discharge status: Home / Self Care | Payer: BLUE CROSS/BLUE SHIELD | Source: Ambulatory Visit | Attending: Internal Medicine | Admitting: Internal Medicine

## 2023-05-17 ENCOUNTER — Encounter (HOSPITAL_COMMUNITY): Payer: Self-pay | Admitting: Internal Medicine

## 2023-05-17 VITALS — BP 118/70 | HR 93 | Wt 252.2 lb

## 2023-05-17 DIAGNOSIS — I3139 Other pericardial effusion (noninflammatory): Secondary | ICD-10-CM | POA: Insufficient documentation

## 2023-05-17 DIAGNOSIS — I493 Ventricular premature depolarization: Secondary | ICD-10-CM | POA: Insufficient documentation

## 2023-05-17 DIAGNOSIS — Z9981 Dependence on supplemental oxygen: Secondary | ICD-10-CM | POA: Insufficient documentation

## 2023-05-17 DIAGNOSIS — Z6838 Body mass index (BMI) 38.0-38.9, adult: Secondary | ICD-10-CM | POA: Diagnosis not present

## 2023-05-17 DIAGNOSIS — E119 Type 2 diabetes mellitus without complications: Secondary | ICD-10-CM | POA: Diagnosis not present

## 2023-05-17 DIAGNOSIS — I110 Hypertensive heart disease with heart failure: Secondary | ICD-10-CM | POA: Insufficient documentation

## 2023-05-17 DIAGNOSIS — I358 Other nonrheumatic aortic valve disorders: Secondary | ICD-10-CM | POA: Diagnosis not present

## 2023-05-17 DIAGNOSIS — Z7984 Long term (current) use of oral hypoglycemic drugs: Secondary | ICD-10-CM | POA: Diagnosis not present

## 2023-05-17 DIAGNOSIS — R42 Dizziness and giddiness: Secondary | ICD-10-CM | POA: Diagnosis not present

## 2023-05-17 DIAGNOSIS — J9611 Chronic respiratory failure with hypoxia: Secondary | ICD-10-CM | POA: Diagnosis not present

## 2023-05-17 DIAGNOSIS — Z79899 Other long term (current) drug therapy: Secondary | ICD-10-CM | POA: Insufficient documentation

## 2023-05-17 DIAGNOSIS — E7800 Pure hypercholesterolemia, unspecified: Secondary | ICD-10-CM | POA: Diagnosis not present

## 2023-05-17 DIAGNOSIS — G4733 Obstructive sleep apnea (adult) (pediatric): Secondary | ICD-10-CM

## 2023-05-17 DIAGNOSIS — I5022 Chronic systolic (congestive) heart failure: Secondary | ICD-10-CM

## 2023-05-17 DIAGNOSIS — I10 Essential (primary) hypertension: Secondary | ICD-10-CM

## 2023-05-17 DIAGNOSIS — I252 Old myocardial infarction: Secondary | ICD-10-CM | POA: Diagnosis not present

## 2023-05-17 DIAGNOSIS — I472 Ventricular tachycardia, unspecified: Secondary | ICD-10-CM | POA: Diagnosis not present

## 2023-05-17 DIAGNOSIS — Z794 Long term (current) use of insulin: Secondary | ICD-10-CM | POA: Diagnosis not present

## 2023-05-17 DIAGNOSIS — I5043 Acute on chronic combined systolic (congestive) and diastolic (congestive) heart failure: Secondary | ICD-10-CM | POA: Diagnosis not present

## 2023-05-17 DIAGNOSIS — I272 Pulmonary hypertension, unspecified: Secondary | ICD-10-CM

## 2023-05-17 DIAGNOSIS — Z6841 Body Mass Index (BMI) 40.0 and over, adult: Secondary | ICD-10-CM

## 2023-05-17 DIAGNOSIS — I50810 Right heart failure, unspecified: Secondary | ICD-10-CM

## 2023-05-17 LAB — BRAIN NATRIURETIC PEPTIDE: B Natriuretic Peptide: 24.8 pg/mL (ref 0.0–100.0)

## 2023-05-17 LAB — ECHOCARDIOGRAM COMPLETE
AR max vel: 1.65 cm2
AV Area VTI: 2.05 cm2
AV Area mean vel: 1.84 cm2
AV Mean grad: 4 mmHg
AV Peak grad: 8.8 mmHg
Ao pk vel: 1.48 m/s
Area-P 1/2: 6.12 cm2
Calc EF: 59.2 %
Est EF: 55
S' Lateral: 2.8 cm
Single Plane A2C EF: 56.2 %
Single Plane A4C EF: 60.7 %

## 2023-05-17 LAB — BASIC METABOLIC PANEL
Anion gap: 7 (ref 5–15)
BUN: 9 mg/dL (ref 6–20)
CO2: 32 mmol/L (ref 22–32)
Calcium: 9.3 mg/dL (ref 8.9–10.3)
Chloride: 97 mmol/L — ABNORMAL LOW (ref 98–111)
Creatinine, Ser: 0.75 mg/dL (ref 0.44–1.00)
GFR, Estimated: >60 mL/min (ref >60)
Glucose, Bld: 77 mg/dL (ref 70–99)
Potassium: 4.2 mmol/L (ref 3.5–5.1)
Sodium: 136 mmol/L (ref 135–145)

## 2023-05-17 NOTE — Patient Instructions (Signed)
There has been no changes to your medications.  Labs done today, your results will be available in MyChart, we will contact you for abnormal readings.  You have been ordered a PYP Scan.  This is done at [facility], they will call you to schedule.  When you come for this test please plan to be there 2-3 hours.  They are located at: [address] [place], [place] [address]  Your physician recommends that you schedule a follow-up appointment in: 6 months ( February 2025) ** PLEASE CALL THE OFFICE IN NOVEMBER TO ARRANGE YOUR FOLLOW UP APPOINTMENT. **  If you have any questions or concerns before your next appointment please send [place] a message through [place] or call our office at [phone].    TO LEAVE A MESSAGE FOR THE NURSE SELECT OPTION 2, PLEASE LEAVE A MESSAGE INCLUDING: YOUR NAME DATE OF BIRTH CALL BACK NUMBER REASON FOR CALL**this is important as we prioritize the call backs  YOU WILL RECEIVE A CALL BACK THE SAME DAY AS LONG AS YOU CALL BEFORE 4:00 PM  At the [facility], you and your health needs are our priority. As part of our continuing mission to provide you with exceptional heart care, we have created designated Provider Care Teams. These Care Teams include your primary Cardiologist (physician) and Advanced Practice Providers (APPs- Physician Assistants and Nurse Practitioners) who all work together to provide you with the care you need, when you need it.   You may see any of the following providers on your designated Care Team at your next follow up: Dr Arvilla Meres Dr Marca Ancona Dr. Marcos Eke, NP Robbie Lis, [place] [facility] [place], [place] Brynda Peon, NP Karle Plumber, PharmD   Please be sure to bring in all your medications bottles to every appointment.    Thank you for choosing [facility]

## 2023-05-19 LAB — KAPPA/LAMBDA LIGHT CHAINS
Kappa free light chain: 36.5 mg/L — ABNORMAL HIGH (ref 3.3–19.4)
Kappa, lambda light chain ratio: 1.92 — ABNORMAL HIGH (ref 0.26–1.65)
Lambda free light chains: 19 mg/L (ref 5.7–26.3)

## 2023-05-26 ENCOUNTER — Other Ambulatory Visit: Payer: Self-pay | Admitting: Nurse Practitioner

## 2023-05-28 ENCOUNTER — Ambulatory Visit: Payer: Self-pay | Admitting: Nurse Practitioner

## 2023-05-30 DIAGNOSIS — I10 Essential (primary) hypertension: Secondary | ICD-10-CM | POA: Diagnosis not present

## 2023-05-30 DIAGNOSIS — G4733 Obstructive sleep apnea (adult) (pediatric): Secondary | ICD-10-CM | POA: Diagnosis not present

## 2023-06-01 DIAGNOSIS — G4733 Obstructive sleep apnea (adult) (pediatric): Secondary | ICD-10-CM | POA: Diagnosis not present

## 2023-06-03 ENCOUNTER — Ambulatory Visit (INDEPENDENT_AMBULATORY_CARE_PROVIDER_SITE_OTHER): Payer: BLUE CROSS/BLUE SHIELD | Admitting: Nurse Practitioner

## 2023-06-03 ENCOUNTER — Encounter: Payer: Self-pay | Admitting: Nurse Practitioner

## 2023-06-03 VITALS — BP 122/71 | HR 84 | Temp 97.1°F | Wt 252.4 lb

## 2023-06-03 DIAGNOSIS — D649 Anemia, unspecified: Secondary | ICD-10-CM

## 2023-06-03 DIAGNOSIS — Z1211 Encounter for screening for malignant neoplasm of colon: Secondary | ICD-10-CM

## 2023-06-03 DIAGNOSIS — E538 Deficiency of other specified B group vitamins: Secondary | ICD-10-CM | POA: Diagnosis not present

## 2023-06-03 NOTE — Patient Instructions (Addendum)
1. Colon cancer screening  - Cologuard   2. Anemia, unspecified type  - CBC - Comprehensive metabolic panel   3. Vitamin B12 deficiency  - Vitamin B12    Follow up:  Follow up as scheduled

## 2023-06-03 NOTE — Assessment & Plan Note (Signed)
-   Cologuard   2. Anemia, unspecified type  - CBC - Comprehensive metabolic panel   3. Vitamin B12 deficiency  - Vitamin B12    Follow up:  Follow up as scheduled

## 2023-06-03 NOTE — Progress Notes (Signed)
@Patient  ID: Claudia [name], female    DOB: 12-Mar-1966, 57 y.o.   MRN: [id number]  Chief Complaint  Patient presents with   Follow-up    Referring provider: Ivonne Andrew, NP   HPI  CHF: Patient is on Lasix 80 mg BID and has follow up with cardiology. Patient continues on 3L O2 daily and 4L at night with CPAP. Patient does have OSA.  Has been participating in pulmonary rehab.   Diabetes: Hx of poorly controlled diabetes. A1c 9.3% in 07/2022. A1C in office at last visit 5.7. Taking farxiga 10 mg daily, ozempic, metformin 1000 mg BID. CBGs were mostly within goal. Patient no longer taking glipizide.    Aortic atherosclerosis noted on CT On aspirin 81 mg daily. Has prior allergies to pravastatin, rosuvastatin, and atorvastatin. Lipid panel here WNL w/ exception of LDL 140. Consulted with cardiology. Continues on fish oil currently.    IDA Hemoglobin had dropped at last lab check.  We will recheck today.  On Ferrous sulfate 325 mg daily at home.    Denies f/c/s, n/v/d, hemoptysis, PND, leg swelling Denies chest pain or edema      Allergies  Allergen Reactions   Beef-Derived Products     Patient does not eat beef   Crestor [Rosuvastatin]     MUSCLE ACHES HEADACHES    Diltiazem Hives   Hydrocodone Itching   Other Hives, Swelling and Other (See Comments)    Vision changes; ALLERGIC TO ALL NUTS EXCEPT PEANUTS   Oxycodone Itching   Pork-Derived Products     For dietary, patient does not eat pork   Pravachol [Pravastatin Sodium] Itching    Joint pain   Simvastatin Hives    Immunization History  Administered Date(s) Administered   Pneumococcal Polysaccharide-23 03/01/2017   Tdap 03/01/2017    Past Medical History:  Diagnosis Date   Diabetes mellitus    Fibroids    Headache(784.0)    History of echocardiogram    Echo 7/18: Moderate concentric LVH, EF 55-60, normal wall motion, grade 1 diastolic dysfunction, calcified aortic leaflets   Hypercholesterolemia     Hyperlipidemia    Hypertension    Osteoarthritis    , with mild meniscus tear, followed by orthopedics--Dr Darrelyn Hillock 04/29/10   Right knee pain    Seizures (HCC)    as child   STEMI (ST elevation myocardial infarction) (HCC)    Vitamin D deficiency     Tobacco History: Social History   Tobacco Use  Smoking Status Never  Smokeless Tobacco Never   Counseling given: Not Answered   Outpatient Encounter Medications as of 06/03/2023  Medication Sig   acetaminophen (TYLENOL) 500 MG tablet Take 500 mg by mouth every 6 (six) hours as needed for mild pain or moderate pain.   amLODipine (NORVASC) 5 MG tablet Take 1 tablet (5 mg total) by mouth daily.   aspirin 81 MG EC tablet Take 1 tablet (81 mg total) by mouth daily.   blood glucose meter kit and supplies KIT Dispense based on patient and insurance preference. Use up to four times daily as directed. (FOR ICD-9 250.00, 250.01).   Blood Glucose Monitoring Suppl (ACCU-CHEK GUIDE) w/Device KIT 1 each by Does not apply route in the morning, at noon, in the evening, and at bedtime.   carvedilol (COREG) 3.125 MG tablet Take 1 tablet (3.125 mg total) by mouth 2 (two) times daily with a meal.   dapagliflozin propanediol (FARXIGA) 10 MG TABS tablet Take 1 tablet by mouth once  daily   ferrous sulfate 325 (65 FE) MG tablet Take 1 tablet (325 mg total) by mouth daily with breakfast.   folic acid (FOLVITE) 800 MCG tablet Take 0.5 tablets (400 mcg total) by mouth daily.   furosemide (LASIX) 80 MG tablet Take 1 tablet (80 mg total) by mouth 2 (two) times daily.   glucose blood (ACCU-CHEK GUIDE) test strip Use as instructed to check sugar 4 times daily.   Lancets (ACCU-CHEK MULTICLIX) lancets Use as instructed to check blood sugar 4 times daily.   metFORMIN (GLUCOPHAGE) 1000 MG tablet Take 1 tablet (1,000 mg total) by mouth 2 (two) times daily with a meal.   Omega-3 Fatty Acids (FISH OIL) 1000 MG CAPS Take 1,000 mg by mouth daily.   potassium chloride SA  (KLOR-CON M) 20 MEQ tablet Take 1 tablet (20 mEq total) by mouth daily.   sacubitril-valsartan (ENTRESTO) 97-103 MG Take 1 tablet by mouth 2 (two) times daily.   Semaglutide, 2 MG/DOSE, (OZEMPIC, 2 MG/DOSE,) 8 MG/3ML SOPN Inject 2 mg into the skin once a week.   spironolactone (ALDACTONE) 25 MG tablet Take 1 tablet (25 mg total) by mouth daily.   vitamin B-12 (CYANOCOBALAMIN) 100 MCG tablet Take 1 tablet (100 mcg total) by mouth daily. (Patient not taking: Reported on 05/17/2023)   No facility-administered encounter medications on file as of 06/03/2023.     Review of Systems  Review of Systems  Constitutional: Negative.   HENT: Negative.    Cardiovascular: Negative.   Gastrointestinal: Negative.   Allergic/Immunologic: Negative.   Neurological: Negative.   Psychiatric/Behavioral: Negative.         Physical Exam  BP 122/71   Pulse 84   Temp (!) 97.1 F (36.2 C)   Wt 252 lb 6.4 oz (114.5 kg)   LMP 05/09/2012   SpO2 99%   BMI 38.38 kg/m   Wt Readings from Last 5 Encounters:  06/03/23 252 lb 6.4 oz (114.5 kg)  05/17/23 252 lb 3.2 oz (114.4 kg)  04/26/23 250 lb 3.2 oz (113.5 kg)  04/06/23 251 lb 5.2 oz (114 kg)  03/02/23 247 lb 2.2 oz (112.1 kg)     Physical Exam Vitals and nursing note reviewed.  Constitutional:      General: She is not in acute distress.    Appearance: She is well-developed.  Cardiovascular:     Rate and Rhythm: Normal rate and regular rhythm.  Pulmonary:     Effort: Pulmonary effort is normal.     Breath sounds: Normal breath sounds.  Neurological:     Mental Status: She is alert and oriented to person, place, and time.      Lab Results:  CBC    Component Value Date/Time   WBC 6.1 04/26/2023 0910   WBC 3.8 (L) 12/05/2022 0046   RBC 4.37 04/26/2023 0910   RBC 6.96 (H) 12/05/2022 0046   HGB 9.9 (L) 04/26/2023 0910   HCT 32.4 (L) 04/26/2023 0910   PLT 366 04/26/2023 0910   MCV 74 (L) 04/26/2023 0910   MCH 22.7 (L) 04/26/2023 0910    MCH 21.1 (L) 12/05/2022 0046   MCHC 30.6 (L) 04/26/2023 0910   MCHC 28.7 (L) 12/05/2022 0046   RDW 17.5 (H) 04/26/2023 0910   LYMPHSABS 1.9 12/02/2022 1924   LYMPHSABS 2.8 02/03/2022 1425   MONOABS 0.9 12/02/2022 1924   EOSABS 0.1 12/02/2022 1924   EOSABS 0.2 02/03/2022 1425   BASOSABS 0.0 12/02/2022 1924   BASOSABS 0.0 02/03/2022 1425  BMET    Component Value Date/Time   NA 136 05/17/2023 1257   NA 140 04/26/2023 0910   K 4.2 05/17/2023 1257   CL 97 (L) 05/17/2023 1257   CO2 32 05/17/2023 1257   GLUCOSE 77 05/17/2023 1257   BUN 9 05/17/2023 1257   BUN 14 04/26/2023 0910   CREATININE 0.75 05/17/2023 1257   CREATININE 0.74 07/07/2017 1557   CALCIUM 9.3 05/17/2023 1257   GFRNONAA >60 05/17/2023 1257   GFRNONAA 95 07/07/2017 1557   GFRAA 86 10/04/2020 1607   GFRAA 110 07/07/2017 1557    BNP    Component Value Date/Time   BNP 24.8 05/17/2023 1257   BNP 6.2 02/25/2017 1536    ProBNP    Component Value Date/Time   PROBNP 12 04/12/2017 0839   PROBNP 420.2 (H) 06/01/2012 1648    Imaging: ECHOCARDIOGRAM COMPLETE  Result Date: 05/17/2023    ECHOCARDIOGRAM REPORT   Patient Name:   LASHUNNA DAZZO Morin Date of Exam: 05/17/2023 Medical Rec #:  [id number]      Height:       68.0 in Accession #:    [id number]     Weight:       250.2 lb Date of Birth:  [birth year]     BSA:          2.248 m Patient Age:    56 years       BP:           113/76 mmHg Patient Gender: F              HR:           98 bpm. Exam Location:  Outpatient Procedure: 2D Echo, 3D Echo, Cardiac Doppler, Color Doppler and Strain Analysis Indications:    I50.22 Chronic systolic (congestive) heart failure  History:        Patient has prior history of Echocardiogram examinations, most                 recent 12/04/2022. CHF, Previous Myocardial Infarction, Morbidly                 obese, Arrythmias:Tachycardia; Risk Factors:Hypertension,                 Dyslipidemia, Diabetes and Non-Smoker.  Sonographer:    Jake Seats RDMS,  RVT, RDCS Referring Phys: [id number] DANIEL R BENSIMHON IMPRESSIONS  1. Left ventricular ejection fraction, by estimation, is 55%. The left ventricle has normal function. The left ventricle has no regional wall motion abnormalities. There is moderate asymmetric left ventricular hypertrophy of the infero-lateral segment. Indeterminate diastolic filling due to E-A fusion. The average left ventricular global longitudinal strain is -19.9 %. The global longitudinal strain is normal.  2. Right ventricular systolic function is low normal. The right ventricular size is mildly enlarged. Tricuspid regurgitation signal is inadequate for assessing PA pressure.  3. A small pericardial effusion is present. The pericardial effusion is anterior to the right ventricle.  4. The mitral valve is grossly normal. Trivial mitral valve regurgitation. No evidence of mitral stenosis.  5. The aortic valve is tricuspid. There is mild calcification of the aortic valve. Aortic valve regurgitation is not visualized. Aortic valve sclerosis is present, with no evidence of aortic valve stenosis.  6. The inferior vena cava is normal in size with <50% respiratory variability, suggesting right atrial pressure of 8 mmHg. Comparison(s): Prior images reviewed side by side. Changes from prior study are noted. LVEF has improved and RV size  and function have improved. FINDINGS  Left Ventricle: Left ventricular ejection fraction, by estimation, is 55%. The left ventricle has normal function. The left ventricle has no regional wall motion abnormalities. The average left ventricular global longitudinal strain is -19.9 %. The global longitudinal strain is normal. The global longitudinal strain is normal despite suboptimal segment tracking. 3D ejection fraction reviewed and evaluated as part of the interpretation. Alternate measurement of EF is felt to be most reflective of LV  function. The left ventricular internal cavity size was normal in size. There is moderate  asymmetric left ventricular hypertrophy of the infero-lateral segment. Indeterminate diastolic filling due to E-A fusion. Right Ventricle: The right ventricular size is mildly enlarged. No increase in right ventricular wall thickness. Right ventricular systolic function is low normal. Tricuspid regurgitation signal is inadequate for assessing PA pressure. Left Atrium: Left atrial size was normal in size. Right Atrium: Right atrial size was normal in size. Pericardium: A small pericardial effusion is present. The pericardial effusion is anterior to the right ventricle. Mitral Valve: The mitral valve is grossly normal. Trivial mitral valve regurgitation. No evidence of mitral valve stenosis. Tricuspid Valve: The tricuspid valve is grossly normal. Tricuspid valve regurgitation is trivial. No evidence of tricuspid stenosis. Aortic Valve: The aortic valve is tricuspid. There is mild calcification of the aortic valve. Aortic valve regurgitation is not visualized. Aortic valve sclerosis is present, with no evidence of aortic valve stenosis. Aortic valve mean gradient measures 4.0 mmHg. Aortic valve peak gradient measures 8.8 mmHg. Aortic valve area, by VTI measures 2.05 cm. Pulmonic Valve: The pulmonic valve was grossly normal. Pulmonic valve regurgitation is not visualized. No evidence of pulmonic stenosis. Aorta: The aortic root is normal in size and structure. Venous: The inferior vena cava is normal in size with less than 50% respiratory variability, suggesting right atrial pressure of 8 mmHg. IAS/Shunts: The atrial septum is grossly normal.  LEFT VENTRICLE PLAX 2D LVIDd:         4.50 cm     Diastology LVIDs:         2.80 cm     LV e' medial:    7.07 cm/s LV PW:         1.40 cm     LV E/e' medial:  9.5 LV IVS:        1.10 cm     LV e' lateral:   9.68 cm/s LVOT diam:     1.70 cm     LV E/e' lateral: 7.0 LV SV:         43 LV SV Index:   19          2D Longitudinal Strain LVOT Area:     2.27 cm    2D Strain GLS (A2C):    -17.4 %                            2D Strain GLS (A3C):   -23.5 %                            2D Strain GLS (A4C):   -18.8 % LV Volumes (MOD)           2D Strain GLS Avg:     -19.9 % LV vol d, MOD A2C: 90.6 ml LV vol d, MOD A4C: 65.0 ml LV vol s, MOD A2C: 39.7 ml LV vol s, MOD A4C: 25.6 ml LV  SV MOD A2C:     50.9 ml LV SV MOD A4C:     65.0 ml LV SV MOD BP:      48.4 ml RIGHT VENTRICLE RV S prime:     12.40 cm/s TAPSE (M-mode): 2.9 cm LEFT ATRIUM             Index        RIGHT ATRIUM           Index LA diam:        3.80 cm 1.69 cm/m   RA Area:     14.90 cm LA Vol (A2C):   30.8 ml 13.70 ml/m  RA Volume:   38.60 ml  17.17 ml/m LA Vol (A4C):   23.8 ml 10.59 ml/m LA Biplane Vol: 29.1 ml 12.95 ml/m  AORTIC VALVE AV Area (Vmax):    1.65 cm AV Area (Vmean):   1.84 cm AV Area (VTI):     2.05 cm AV Vmax:           148.33 cm/s AV Vmean:          89.800 cm/s AV VTI:            0.211 m AV Peak Grad:      8.8 mmHg AV Mean Grad:      4.0 mmHg LVOT Vmax:         108.00 cm/s LVOT Vmean:        72.700 cm/s LVOT VTI:          0.191 m LVOT/AV VTI ratio: 0.91  AORTA Ao Root diam: 2.90 cm Ao Asc diam:  2.60 cm MITRAL VALVE MV Area (PHT): 6.12 cm    SHUNTS MV Decel Time: 124 msec    Systemic VTI:  0.19 m MV E velocity: 67.40 cm/s  Systemic Diam: 1.70 cm MV A velocity: 96.20 cm/s MV E/A ratio:  0.70 Weston Brass MD Electronically signed by Weston Brass MD Signature Date/Time: 05/17/2023/10:30:32 PM    Final      Assessment & Plan:   Colon cancer screening - Cologuard   2. Anemia, unspecified type  - CBC - Comprehensive metabolic panel   3. Vitamin B12 deficiency  - Vitamin B12    Follow up:  Follow up as scheduled     Ivonne Andrew, NP 06/03/2023

## 2023-06-04 LAB — COMPREHENSIVE METABOLIC PANEL
ALT: 6 IU/L (ref 0–32)
AST: 11 IU/L (ref 0–40)
Albumin: 4.7 g/dL (ref 3.8–4.9)
Alkaline Phosphatase: 67 IU/L (ref 44–121)
BUN/Creatinine Ratio: 20 (ref 9–23)
BUN: 16 mg/dL (ref 6–24)
Bilirubin Total: 0.3 mg/dL (ref 0.0–1.2)
CO2: 31 mmol/L — ABNORMAL HIGH (ref 20–29)
Calcium: 10.2 mg/dL (ref 8.7–10.2)
Chloride: 97 mmol/L (ref 96–106)
Creatinine, Ser: 0.81 mg/dL (ref 0.57–1.00)
Globulin, Total: 2.9 g/dL (ref 1.5–4.5)
Glucose: 128 mg/dL — ABNORMAL HIGH (ref 70–99)
Potassium: 4.2 mmol/L (ref 3.5–5.2)
Sodium: 140 mmol/L (ref 134–144)
Total Protein: 7.6 g/dL (ref 6.0–8.5)
eGFR: 86 mL/min/{1.73_m2} (ref >59)

## 2023-06-04 LAB — CBC
Hematocrit: 34.6 % (ref 34.0–46.6)
Hemoglobin: 11 g/dL — ABNORMAL LOW (ref 11.1–15.9)
MCH: 24 pg — ABNORMAL LOW (ref 26.6–33.0)
MCHC: 31.8 g/dL (ref 31.5–35.7)
MCV: 76 fL — ABNORMAL LOW (ref 79–97)
Platelets: 368 10*3/uL (ref 150–450)
RBC: 4.58 x10E6/uL (ref 3.77–5.28)
RDW: 13.2 % (ref 11.7–15.4)
WBC: 5.6 10*3/uL (ref 3.4–10.8)

## 2023-06-04 LAB — VITAMIN B12: Vitamin B-12: >2000 pg/mL — ABNORMAL HIGH (ref 232–1245)

## 2023-06-08 NOTE — Progress Notes (Unsigned)
   06/08/2023 Name: Claudia [name] MRN: [id number] DOB: [birth year]  No chief complaint on file.   Claudia [name] is a 57 y.o. year old female who presented for a telephone visit.   They were referred to the pharmacist by their PCP for assistance in managing diabetes and hyperlipidemia.    Subjective:  Care Team: Primary Care Provider: Ivonne Andrew, NP ; Next Scheduled Visit: 08/02/23  Medication Access/Adherence  Current Pharmacy:  [facility] [address] [place] - [address] [place] [address] Phone: [phone] Fax: [phone]  [facility] [address], [address] [place] [address] Phone: [phone] Fax: [phone]  [name] [address] [place] [address] Phone: [phone] Fax: [phone]   Patient reports affordability concerns with their medications: {YES/NO:21197} Patient reports access/transportation concerns to their pharmacy: {YES/NO:21197} Patient reports adherence concerns with their medications:  {YES/NO:21197} ***   Diabetes:  Current medications: metformin 1000 mg twice daily, Ozempic 2 mg weekly, Farxiga 10 mg daily Medications tried in the past:   Current glucose readings: *** Using *** meter; testing *** times daily  Date of Download: *** % Time CGM is active: ***% Average Glucose: *** mg/dL Glucose Management Indicator: ***  Glucose Variability: *** (goal <36%) Time in Goal:  - Time in range 70-180: ***% - Time above range: ***% - Time below range: ***% Observed patterns:  Patient {Actions; denies-reports:120008} hypoglycemic s/sx including ***dizziness, shakiness, sweating. Patient {Actions; denies-reports:120008} hyperglycemic symptoms including ***polyuria, polydipsia, polyphagia, nocturia, neuropathy, blurred vision.  Current meal patterns:  - Breakfast: *** -  Lunch *** - Supper *** - Snacks *** - Drinks ***  Current physical activity: ***  Current medication access support: *** Hyperlipidemia/ASCVD Risk Reduction  Current lipid lowering medications: fish oil? Medications tried in the past: atorvastatin 10 mg, rosuvastatin 5-10 mg, simvastatin 20 mg  Antiplatelet regimen: ASA 81 mg daily  ASCVD History: aortic atherosclerosis noted on CT Risk Factors: T2DM, HTN  Current physical activity: ***  Current medication access support: ***  @[email or web address] @  PREVENT Risk Score: 10 year risk of CVD: *** - 10 year risk of ASCVD: *** - 10 year risk of HF: ***   Objective:  Lab Results  Component Value Date   HGBA1C 5.7 (A) 04/26/2023    Lab Results  Component Value Date   CREATININE 0.81 06/03/2023   BUN 16 06/03/2023   NA 140 06/03/2023   K 4.2 06/03/2023   CL 97 06/03/2023   CO2 31 (H) 06/03/2023    Lab Results  Component Value Date   CHOL 196 12/02/2022   HDL 41 12/02/2022   LDLCALC 140 (H) 12/02/2022   TRIG 75 12/02/2022   CHOLHDL 4.8 12/02/2022    Medications Reviewed Today   Medications were not reviewed in this encounter       Assessment/Plan:   {Pharmacy A/P Choices:26421}  Follow Up Plan: ***  ***

## 2023-06-09 ENCOUNTER — Other Ambulatory Visit: Payer: Medicaid Other | Admitting: Pharmacist

## 2023-06-09 NOTE — Patient Instructions (Signed)
Ms. Habibi,  It was great talking with you today!  Continue taking Ozempic 2 mg weekly and Farxiga 10 mg daily.  Discontinue metformin.   Try to cut back on processed foods such as chips and deli meats.  Try to incorporate exercise into daily routine. Can start out with 30 minutes of aerobic exercise 1-2 days a week with the goal of increased to 30 minutes 5 days per week.  Your LDL (bad cholesterol) in March was elevated at 140. Our goal for your LDL is <70.  We will recheck your cholesterol at your next visit with your primary care provider and if still elevated plan to start Repatha.   Thanks, Florentina Addison, PharmD

## 2023-06-09 NOTE — Progress Notes (Signed)
 I have reviewed the pharmacist's encounter and agree with their documentation.   Catie Eppie Gibson, PharmD, BCACP, CPP [facility] [phone]

## 2023-06-10 ENCOUNTER — Telehealth (HOSPITAL_COMMUNITY): Payer: Self-pay

## 2023-06-10 NOTE — Telephone Encounter (Signed)
Detailed instructions left on the patient's answering machine. Asked to call back with any questions. S.Aby Gessel CCT

## 2023-06-15 ENCOUNTER — Other Ambulatory Visit: Payer: Self-pay | Admitting: Nurse Practitioner

## 2023-06-15 ENCOUNTER — Ambulatory Visit (HOSPITAL_COMMUNITY): Payer: BLUE CROSS/BLUE SHIELD

## 2023-06-18 ENCOUNTER — Telehealth (HOSPITAL_COMMUNITY): Payer: Self-pay | Admitting: *Deleted

## 2023-06-18 NOTE — Telephone Encounter (Signed)
   Pyp

## 2023-06-20 LAB — COLOGUARD

## 2023-06-21 ENCOUNTER — Other Ambulatory Visit: Payer: Self-pay | Admitting: Nurse Practitioner

## 2023-06-22 ENCOUNTER — Telehealth: Payer: Self-pay | Admitting: Nurse Practitioner

## 2023-06-22 NOTE — Telephone Encounter (Signed)
Pt LVM on nurse line 06/21/2023 requesting a call back because she doesn't understand why she needs to repeat the colon test. [phone]

## 2023-06-25 NOTE — Telephone Encounter (Signed)
Auth denied due to patient having no out of network benefits.

## 2023-06-26 NOTE — Telephone Encounter (Signed)
Lvm for pt rpt to advise cologuard will be reaching out to the pt concerning his cologuard. It will need to be redone. KH

## 2023-06-28 NOTE — Telephone Encounter (Signed)
Dr Gala Romney aware, recommends holding off on PYP for now

## 2023-07-09 ENCOUNTER — Ambulatory Visit (HOSPITAL_COMMUNITY): Payer: BLUE CROSS/BLUE SHIELD

## 2023-07-09 LAB — COLOGUARD: COLOGUARD: NEGATIVE

## 2023-07-13 ENCOUNTER — Other Ambulatory Visit (INDEPENDENT_AMBULATORY_CARE_PROVIDER_SITE_OTHER): Payer: BLUE CROSS/BLUE SHIELD | Admitting: Pharmacist

## 2023-07-13 ENCOUNTER — Encounter: Payer: Self-pay | Admitting: Pharmacist

## 2023-07-13 ENCOUNTER — Telehealth (HOSPITAL_COMMUNITY): Payer: Self-pay | Admitting: *Deleted

## 2023-07-13 DIAGNOSIS — E1169 Type 2 diabetes mellitus with other specified complication: Secondary | ICD-10-CM

## 2023-07-13 NOTE — Progress Notes (Signed)
07/13/2023 Name: Claudia [name] MRN: [id number] DOB: [birth year]  Chief Complaint  Patient presents with   Medication Management   Diabetes    Claudia [name] is a 57 y.o. year old female who presented for a telephone visit.   They were referred to the pharmacist by their PCP for assistance in managing diabetes, hypertension, and hyperlipidemia.    Subjective:  Care Team: Primary Care Provider: Ivonne Andrew, NP ; Next Scheduled Visit: 08/02/23  Medication Access/Adherence  Current Pharmacy:  [facility] [address], [place] - [address] [place] [address] Phone: [phone] Fax: [phone]  [facility] [address] [place] [address] Phone: [phone] Fax: [phone]  [facility] [address] [place] [address] Phone: [phone] Fax: [phone]   Patient reports affordability concerns with their medications: No  Patient reports access/transportation concerns to their pharmacy: No  Patient reports adherence concerns with their medications:  No     Diabetes:  Current medications: Farxiga 10 mg daily, Ozempic 2 mg weekly Medications tried in the past: metformin - relative hypotensive symptoms  Current glucose readings: 80-90s; reports a post prandial reading of 190 - had cake and sprite. Reports a decreased frequency of symptomatic episodes since stopping metformin and glipizide.   Reports an episode of dizziness/drowsiness. Checked sugar, it was in the 70-80s  Patient reports hypoglycemic s/sx including dizziness- see below.   Heart Failure:  Current medications:  ACEi/ARB/ARNI: Entresto 97/103 twice daily is prescribed, but per fill history pharmacy has still been filling 49/51 mg SGLT2i: Farxiga 10 mg daily  Beta blocker: carvedilol 3.125 mg twice daily  Mineralocorticoid  Receptor Antagonist: spironolactone 25 mg daily  Diuretic regimen: furosemide 80 mg twice daily - takes in the morning, ~ 5 pm   Current home blood pressure readings: does not have a cuff at home Current home weights: baseline 239 lbs; then within 2 days 245 lbs; last week; today's weight is still 245 lbs;   Does report lightheadedness if she stands up too quickly.   Patient denies volume overload signs or symptoms including shortness of breath, lower extremity edema, increased use of pillows at night- uses . Does report shortness of breath with exertion (carrying kids)  Hyperlipidemia/ASCVD Risk Reduction  Hyperlipidemia/ASCVD Risk Reduction   Current lipid lowering medications: fish oil 1000 mg daily Medications tried in the past: atorvastatin 10 mg, rosuvastatin 5-10 mg, pravastatin - myalgias  Antiplatelet regimen: ASA 81 mg daily   ASCVD History: aortic atherosclerosis noted on CT Risk Factors: T2DM, HTN   Objective:  Lab Results  Component Value Date   HGBA1C 5.7 (A) 04/26/2023    Lab Results  Component Value Date   CREATININE 0.81 06/03/2023   BUN 16 06/03/2023   NA 140 06/03/2023   K 4.2 06/03/2023   CL 97 06/03/2023   CO2 31 (H) 06/03/2023    Lab Results  Component Value Date   CHOL 196 12/02/2022   HDL 41 12/02/2022   LDLCALC 140 (H) 12/02/2022   TRIG 75 12/02/2022   CHOLHDL 4.8 12/02/2022    Medications Reviewed Today     Reviewed by Alden Hipp, RPH-CPP (Pharmacist) on 07/13/23 at 0933  Med List Status: <None>   Medication Order Taking? Sig Documenting Provider Last Dose Status Informant  acetaminophen (TYLENOL) 500 MG tablet [id number]  Take 500 mg by mouth every 6 (six) hours as  needed for mild pain or moderate pain. [provider]  Active Self  amLODipine (NORVASC) 5 MG tablet [id number] Yes Take 1 tablet (5 mg total) by mouth daily. Ivonne Andrew, NP Taking Active   aspirin 81 MG EC tablet [id number] Yes Take 1 tablet (81 mg  total) by mouth daily. Kallie Locks, FNP Taking Active Self  blood glucose meter kit and supplies KIT [id number] Yes Dispense based on patient and insurance preference. Use up to four times daily as directed. (FOR ICD-9 250.00, 250.01). Kallie Locks, FNP Taking Active Self  Blood Glucose Monitoring Suppl (ACCU-CHEK GUIDE) w/Device KIT [id number] Yes 1 each by Does not apply route in the morning, at noon, in the evening, and at bedtime. Ivonne Andrew, NP Taking Active Self  carvedilol (COREG) 3.125 MG tablet [id number] Yes Take 1 tablet (3.125 mg total) by mouth 2 (two) times daily with a meal. Clegg, Amy D, NP Taking Active   dapagliflozin propanediol (FARXIGA) 10 MG TABS tablet [id number] Yes Take 1 tablet by mouth once daily Ivonne Andrew, NP Taking Active   ferrous sulfate 325 (65 FE) MG tablet [id number] Yes Take 1 tablet (325 mg total) by mouth daily with breakfast. Ivonne Andrew, NP Taking Active   folic acid (FOLVITE) 800 MCG tablet [id number] Yes Take 0.5 tablets (400 mcg total) by mouth daily. Ivonne Andrew, NP Taking Active   furosemide (LASIX) 80 MG tablet [id number] Yes Take 1 tablet by mouth twice daily Ivonne Andrew, NP Taking Active   glucose blood (ACCU-CHEK GUIDE) test strip [id number]  Use as instructed to check sugar 4 times daily. Ivonne Andrew, NP  Active Self  Lancets (ACCU-CHEK MULTICLIX) lancets [id number]  Use as instructed to check blood sugar 4 times daily. Ivonne Andrew, NP  Active Self  Omega-3 Fatty Acids (FISH OIL) 1000 MG CAPS [id number] Yes Take 1,000 mg by mouth daily. [provider] Taking Active Self  potassium chloride SA (KLOR-CON M) 20 MEQ tablet [id number] Yes Take 1 tablet (20 mEq total) by mouth daily. Bensimhon, Bevelyn Buckles, MD Taking Active   sacubitril-valsartan (ENTRESTO) 97-103 MG [id number] Yes Take 1 tablet by mouth 2 (two) times daily. Bensimhon, Bevelyn Buckles, MD Taking Active            Med Note Myrtie Cruise Jun 09, 2023   9:11 AM) Taking differently - 49/51 mg 1 tab twice daily  Semaglutide, 2 MG/DOSE, (OZEMPIC, 2 MG/DOSE,) 8 MG/3ML SOPN [id number] Yes Inject 2 mg into the skin once a week. Ivonne Andrew, NP Taking Active   spironolactone (ALDACTONE) 25 MG tablet [id number] Yes Take 1 tablet (25 mg total) by mouth daily. Tonye Becket D, NP Taking Active Self              Assessment/Plan:   Diabetes: - Currently controlled per home readings.  - Reviewed goal A1c, goal fasting, and goal 2 hour post prandial glucose - Recommend to continue current regimen at this time. PCP follow up with A1c in ~ 3 weeks - Recommend to check glucose twice weekly, alternating between fasting and 2 hour post prandial  Hyperlipidemia/ASCVD Risk Reduction: - Currently uncontrolled.  - Previously discussed PCSK9i given aortic atherosclerosis, patient preferred to wait until lipids are rechecked. Will check lipids with upcoming PCP appointment.   Heart Failure: - Currently opportunity for optimization.  - Coordinated with heart failure clinic to report patient's 6 lbs weight gain.  - Order placed to [facility]  Delivers for BP cuff for home use.  - Discussed to take second dose of furosemide in early afternoon.  - As last office BP was controlled, will hold on coordinating with pharmacy to fill 97/103 until more recent home readings are obtained. Could eliminate amlodipine to increase Entresto, if needed.  - Patient notes she does not have BCBS plan. Coordinate with cardiology regarding PYP.  Addendum: discussed with Jessica Millford. She recommended to take additional furosemide 40 mg daily x 3 days plus extra potassium 20 mEq x 3 days. Also fill and take Entresto 97/103 mg twice daily. Called [facility] to ask to fill.     Follow Up Plan: phone call in 6 weeks  Catie TClearance Coots, PharmD, BCACP, CPP Clinical Pharmacist [facility] [phone]

## 2023-07-13 NOTE — Patient Instructions (Addendum)
Claudia [name],   It was great talking to you today!  Continue Ozempic 2 mg weekly and Farxiga 10 mg daily.   Check your blood sugars a few times weekly, alternating between: 1) Fasting, first thing in the morning before breakfast and  2) 2 hours after your largest meal.   For a goal A1c of less than 7%, goal fasting readings are less than 130 and goal 2 hour after meal readings are less than 180.    We will check your cholesterol at your next visit. If your LDL is not less than 70, we will consider some types of medications that are not statins.   I will reach out to the heart failure clinic about your insurance.   Take care!  Catie Eppie Gibson, PharmD, BCACP, CPP Clinical Pharmacist [facility] [phone]

## 2023-07-13 NOTE — Telephone Encounter (Signed)
Per message from Nilda Simmer, [facility] pt has had 6 lb wt gain last week and it has not went back down. Pt also has only been taking Entresto 49/51 mg BID instead of the 97/103 mg BID.  Per Prince Rome, NP: please increase Entresto to 97/103 bid, she can take extra Lasix 40 mg daily x 3 days, also take extra 20 KCL x 3 days when taking extra Lasix. Hopefully increasing her Entresto dose will help with volume   Santina Evans advised pt of these recommendations and to f/u with our office later this week if wt not returning to normal. Med list updated.

## 2023-07-14 ENCOUNTER — Other Ambulatory Visit: Payer: BLUE CROSS/BLUE SHIELD | Admitting: Pharmacist

## 2023-07-14 DIAGNOSIS — H5213 Myopia, bilateral: Secondary | ICD-10-CM | POA: Diagnosis not present

## 2023-07-14 LAB — HM DIABETES EYE EXAM

## 2023-07-21 ENCOUNTER — Other Ambulatory Visit: Payer: Self-pay | Admitting: Nurse Practitioner

## 2023-07-21 DIAGNOSIS — E11319 Type 2 diabetes mellitus with unspecified diabetic retinopathy without macular edema: Secondary | ICD-10-CM

## 2023-07-23 ENCOUNTER — Encounter (HOSPITAL_COMMUNITY): Payer: BLUE CROSS/BLUE SHIELD

## 2023-08-02 ENCOUNTER — Other Ambulatory Visit: Payer: Self-pay | Admitting: Nurse Practitioner

## 2023-08-02 ENCOUNTER — Ambulatory Visit: Payer: Self-pay | Admitting: Nurse Practitioner

## 2023-08-11 ENCOUNTER — Telehealth (HOSPITAL_COMMUNITY): Payer: Self-pay | Admitting: *Deleted

## 2023-08-11 ENCOUNTER — Other Ambulatory Visit: Payer: Self-pay | Admitting: Nurse Practitioner

## 2023-08-11 NOTE — Telephone Encounter (Signed)
Reminder call given to patient for upcoming amyloid study on 08/16/23 at 12:30

## 2023-08-12 ENCOUNTER — Encounter (HOSPITAL_COMMUNITY): Payer: BLUE CROSS/BLUE SHIELD

## 2023-08-13 ENCOUNTER — Telehealth (HOSPITAL_COMMUNITY): Payer: Self-pay | Admitting: Cardiology

## 2023-08-13 ENCOUNTER — Telehealth: Payer: Self-pay | Admitting: *Deleted

## 2023-08-13 NOTE — Telephone Encounter (Signed)
Pt currently scheduled for 11/18 pyp scan -will need to reschedule as pre cert is pending/ in process as of 11/15  Original insurance denied for out of network [facility])  Pt has since changed insurance,hence new pre cert  Detailed message left for message  [place] B working on pre cert and to contact Thea Alken to r/s

## 2023-08-16 ENCOUNTER — Telehealth: Payer: Self-pay | Admitting: *Deleted

## 2023-08-16 ENCOUNTER — Ambulatory Visit (HOSPITAL_COMMUNITY): Payer: Medicaid Other | Attending: Cardiology

## 2023-08-16 DIAGNOSIS — I5022 Chronic systolic (congestive) heart failure: Secondary | ICD-10-CM | POA: Insufficient documentation

## 2023-08-16 LAB — MYOCARDIAL AMYLOID PLANAR & SPECT: H/CL Ratio: 1.38

## 2023-08-16 MED ORDER — TECHNETIUM TC 99M PYROPHOSPHATE
20.8000 | Freq: Once | INTRAVENOUS | Status: DC
Start: 2023-08-16 — End: 2023-08-16

## 2023-08-16 MED ORDER — TECHNETIUM TC 99M PYROPHOSPHATE
21.3000 | Freq: Once | INTRAVENOUS | Status: AC
Start: 2023-08-16 — End: 2023-08-16
  Administered 2023-08-16: 21.3 via INTRAVENOUS

## 2023-08-16 NOTE — Telephone Encounter (Signed)
Called [facility] and was told no auth required for cpt code 40102 call ref # [id number]

## 2023-09-02 ENCOUNTER — Ambulatory Visit: Payer: Self-pay | Admitting: Nurse Practitioner

## 2023-09-05 ENCOUNTER — Other Ambulatory Visit (HOSPITAL_COMMUNITY): Payer: Self-pay | Admitting: Adult Health

## 2023-09-05 ENCOUNTER — Other Ambulatory Visit: Payer: Self-pay | Admitting: Nurse Practitioner

## 2023-09-05 DIAGNOSIS — I10 Essential (primary) hypertension: Secondary | ICD-10-CM

## 2023-09-05 DIAGNOSIS — I5033 Acute on chronic diastolic (congestive) heart failure: Secondary | ICD-10-CM

## 2023-09-14 ENCOUNTER — Other Ambulatory Visit: Payer: Self-pay

## 2023-09-16 ENCOUNTER — Encounter (HOSPITAL_COMMUNITY): Payer: Self-pay

## 2023-09-16 ENCOUNTER — Other Ambulatory Visit: Payer: Self-pay

## 2023-09-16 ENCOUNTER — Emergency Department (HOSPITAL_COMMUNITY)
Admission: EM | Admit: 2023-09-16 | Discharge: 2023-09-16 | Disposition: A | Discharge status: Home / Self Care | Payer: Medicaid Other | Attending: Emergency Medicine | Admitting: Emergency Medicine

## 2023-09-16 DIAGNOSIS — Z7982 Long term (current) use of aspirin: Secondary | ICD-10-CM | POA: Diagnosis not present

## 2023-09-16 DIAGNOSIS — H60333 Swimmer's ear, bilateral: Secondary | ICD-10-CM | POA: Diagnosis not present

## 2023-09-16 DIAGNOSIS — H9203 Otalgia, bilateral: Secondary | ICD-10-CM | POA: Diagnosis present

## 2023-09-16 MED ORDER — CIPROFLOXACIN-DEXAMETHASONE 0.3-0.1 % OT SUSP: 4.0000 [drp] | Freq: Two times a day (BID) | OTIC | 0 refills | Status: AC

## 2023-09-16 NOTE — Discharge Instructions (Signed)
Follow up with your doctor in the office.  

## 2023-09-16 NOTE — ED Provider Notes (Signed)
[place] EMERGENCY DEPARTMENT AT [facility] Provider Note   CSN: [id number] Arrival date & time: 09/16/23  1136     History  Chief Complaint  Patient presents with   Otalgia    Claudia [name] is a 57 y.o. female.  57 yo F with a chief complaint of bilateral ear pain.  This has been going on for about a month.  She thought maybe they were clogged and she tried peroxide.  She denies any other foreign bodies to the ear.  No fevers or chills.  Feels like it is extending down below the ear now and somewhat into her neck.    Otalgia      Home Medications Prior to Admission medications   Medication Sig Start Date End Date Taking? Authorizing Provider  ciprofloxacin-dexamethasone (CIPRODEX) OTIC suspension Place 4 drops into both ears 2 (two) times daily for 7 days. 09/16/23 09/23/23 Yes Melene Plan, DO  acetaminophen (TYLENOL) 500 MG tablet Take 500 mg by mouth every 6 (six) hours as needed for mild pain or moderate pain.    [provider]  amLODipine (NORVASC) 5 MG tablet Take 1 tablet (5 mg total) by mouth daily. 04/26/23   Ivonne Andrew, NP  aspirin 81 MG EC tablet Take 1 tablet (81 mg total) by mouth daily. 10/04/19   Kallie Locks, FNP  blood glucose meter kit and supplies KIT Dispense based on patient and insurance preference. Use up to four times daily as directed. (FOR ICD-9 250.00, 250.01). 10/03/19   Kallie Locks, FNP  Blood Glucose Monitoring Suppl (ACCU-CHEK GUIDE) w/Device KIT 1 each by Does not apply route in the morning, at noon, in the evening, and at bedtime. 10/28/22   Ivonne Andrew, NP  carvedilol (COREG) 3.125 MG tablet Take 1 tablet (3.125 mg total) by mouth 2 (two) times daily with a meal. 01/28/23   Clegg, Amy D, NP  dapagliflozin propanediol (FARXIGA) 10 MG TABS tablet Take 1 tablet by mouth once daily 09/06/23   Ivonne Andrew, NP  ferrous sulfate 325 (65 FE) MG tablet Take 1 tablet (325 mg total) by mouth daily with breakfast.  04/28/23   Ivonne Andrew, NP  folic acid (FOLVITE) 800 MCG tablet Take 0.5 tablets (400 mcg total) by mouth daily. 04/28/23   Ivonne Andrew, NP  furosemide (LASIX) 80 MG tablet Take 1 tablet by mouth twice daily 08/11/23   Ivonne Andrew, NP  glucose blood (ACCU-CHEK GUIDE) test strip Use as instructed to check sugar 4 times daily. 10/28/22   Ivonne Andrew, NP  Lancets (ACCU-CHEK MULTICLIX) lancets Use as instructed to check blood sugar 4 times daily. 10/28/22   Ivonne Andrew, NP  Omega-3 Fatty Acids (FISH OIL) 1000 MG CAPS Take 1,000 mg by mouth daily.    [provider]  potassium chloride SA (KLOR-CON M) 20 MEQ tablet Take 1 tablet (20 mEq total) by mouth daily. 01/04/23   Bensimhon, Bevelyn Buckles, MD  sacubitril-valsartan (ENTRESTO) 97-103 MG Take 1 tablet by mouth 2 (two) times daily. 12/24/22   Bensimhon, Bevelyn Buckles, MD  Semaglutide, 2 MG/DOSE, (OZEMPIC, 2 MG/DOSE,) 8 MG/3ML SOPN Inject 2 mg into the skin once a week. 04/26/23   Ivonne Andrew, NP  spironolactone (ALDACTONE) 25 MG tablet Take 1 tablet by mouth once daily 09/06/23   Bensimhon, Bevelyn Buckles, MD      Allergies    Beef-derived drug products, Crestor [rosuvastatin], Diltiazem, Hydrocodone, Other, Oxycodone, Pork-derived products,  Pravachol [pravastatin sodium], and Simvastatin    Review of Systems   Review of Systems  HENT:  Positive for ear pain.     Physical Exam Updated Vital Signs BP (!) 146/75 (BP Location: Right Arm)   Pulse 81   Temp 98 F (36.7 C) (Oral)   Resp 16   Ht 5\' 8"  (1.727 m)   Wt 120.7 kg   LMP 05/09/2012   SpO2 98%   BMI 40.45 kg/m  Physical Exam Vitals and nursing note reviewed.  Constitutional:      General: She is not in acute distress.    Appearance: She is well-developed. She is not diaphoretic.  HENT:     Head: Normocephalic and atraumatic.     Ears:     Comments: Edema to bilateral ear canals right worse than left.  What is visualized of the TM is normal bilaterally.  Small  amount of drainage. Eyes:     Pupils: Pupils are equal, round, and reactive to light.  Cardiovascular:     Rate and Rhythm: Normal rate and regular rhythm.     Heart sounds: No murmur heard.    No friction rub. No gallop.  Pulmonary:     Effort: Pulmonary effort is normal.     Breath sounds: No wheezing or rales.  Abdominal:     General: There is no distension.     Palpations: Abdomen is soft.     Tenderness: There is no abdominal tenderness.  Musculoskeletal:        General: No tenderness.     Cervical back: Normal range of motion and neck supple.  Skin:    General: Skin is warm and dry.  Neurological:     Mental Status: She is alert and oriented to person, place, and time.  Psychiatric:        Behavior: Behavior normal.     ED Results / Procedures / Treatments   Labs (all labs ordered are listed, but only abnormal results are displayed) Labs Reviewed - No data to display  EKG None  Radiology No results found.  Procedures Procedures    Medications Ordered in ED Medications - No data to display  ED Course/ Medical Decision Making/ A&P                                 Medical Decision Making Risk Prescription drug management.   57 yo F with a chief complaints of bilateral ear pain.  Going on for about a month.  Clinically the patient has otitis externa.  Will start on antibiotic eardrops.  PCP follow-up.  8:16 PM:  I have discussed the diagnosis/risks/treatment options with the patient.  Evaluation and diagnostic testing in the emergency department does not suggest an emergent condition requiring admission or immediate intervention beyond what has been performed at this time.  They will follow up with PCP. We also discussed returning to the ED immediately if new or worsening sx occur. We discussed the sx which are most concerning (e.g., sudden worsening pain, fever, inability to tolerate by mouth) that necessitate immediate return. Medications administered to the  patient during their visit and any new prescriptions provided to the patient are listed below.  Medications given during this visit Medications - No data to display   The patient appears reasonably screen and/or stabilized for discharge and I doubt any other medical condition or other [facility] requiring further screening, evaluation, or treatment in  the ED at this time prior to discharge.          Final Clinical Impression(s) / ED Diagnoses Final diagnoses:  Acute swimmer's ear of both sides    Rx / DC Orders ED Discharge Orders          Ordered    ciprofloxacin-dexamethasone (CIPRODEX) OTIC suspension  2 times daily        09/16/23 1610              Melene Plan, DO 09/16/23 2016

## 2023-09-16 NOTE — ED Triage Notes (Signed)
Pt arrived POV from home c/o bilateral ear pain that started about a month ago on the right side but now it is worse, it hurts to lay on both sides and she feels like she is swollen behind both ears.

## 2023-09-23 ENCOUNTER — Other Ambulatory Visit (HOSPITAL_COMMUNITY): Payer: Self-pay

## 2023-09-24 ENCOUNTER — Telehealth (HOSPITAL_COMMUNITY): Payer: Self-pay

## 2023-09-24 DIAGNOSIS — I5022 Chronic systolic (congestive) heart failure: Secondary | ICD-10-CM

## 2023-09-24 NOTE — Telephone Encounter (Signed)
Spoke with patient regarding the following results. Patient made aware and patient verbalized understanding.   Cardiac MRI ordered- message sent to Pre Cert.   Advised patient to call back to office with any issues, questions, or concerns. Patient verbalized understanding.

## 2023-09-24 NOTE — Telephone Encounter (Signed)
-----   Message from Arvilla Meres sent at 09/22/2023  2:56 PM EST ----- PYP equivocal for amyloid. Lets check cardiac MRi

## 2023-10-01 ENCOUNTER — Other Ambulatory Visit: Payer: Self-pay

## 2023-10-05 ENCOUNTER — Telehealth (HOSPITAL_COMMUNITY): Payer: Self-pay

## 2023-10-06 ENCOUNTER — Other Ambulatory Visit: Payer: Self-pay | Admitting: Nurse Practitioner

## 2023-10-12 ENCOUNTER — Other Ambulatory Visit: Payer: Self-pay | Admitting: Nurse Practitioner

## 2023-10-20 ENCOUNTER — Ambulatory Visit (INDEPENDENT_AMBULATORY_CARE_PROVIDER_SITE_OTHER): Payer: Medicaid Other | Admitting: Nurse Practitioner

## 2023-10-20 ENCOUNTER — Encounter: Payer: Self-pay | Admitting: Nurse Practitioner

## 2023-10-20 VITALS — HR 82 | Temp 97.1°F | Wt 272.6 lb

## 2023-10-20 DIAGNOSIS — H6061 Unspecified chronic otitis externa, right ear: Secondary | ICD-10-CM | POA: Diagnosis not present

## 2023-10-20 DIAGNOSIS — E11319 Type 2 diabetes mellitus with unspecified diabetic retinopathy without macular edema: Secondary | ICD-10-CM

## 2023-10-20 DIAGNOSIS — Z13220 Encounter for screening for lipoid disorders: Secondary | ICD-10-CM

## 2023-10-20 LAB — POCT GLYCOSYLATED HEMOGLOBIN (HGB A1C): Hemoglobin A1C: 6.5 % — AB (ref 4.0–5.6)

## 2023-10-20 MED ORDER — OFLOXACIN 0.3 % OT SOLN: 5.0000 [drp] | Freq: Every day | OTIC | 0 refills | Status: AC

## 2023-10-20 NOTE — Patient Instructions (Addendum)
1. Diabetic retinopathy of both eyes associated with type 2 diabetes mellitus, macular edema presence unspecified, unspecified retinopathy severity (HCC) (Primary)  - POCT glycosylated hemoglobin (Hb A1C)   Follow up:  Follow up in 3 months

## 2023-10-20 NOTE — Progress Notes (Signed)
Subjective   Patient ID: Claudia [name], female    DOB: 18-May-1966, 58 y.o.   MRN: [id number]  Chief Complaint  Patient presents with   Medical Management of Chronic Issues    Follow up, need Ophthalmologist referral and ENT    Referring provider: Ivonne Andrew, NP  [name] is a 58 y.o. female with Past Medical History: No date: Diabetes mellitus No date: Fibroids No date: Headache(784.0) No date: History of echocardiogram     Comment:  Echo 7/18: Moderate concentric LVH, EF 55-60, normal               wall motion, grade 1 diastolic dysfunction, calcified               aortic leaflets No date: Hypercholesterolemia No date: Hyperlipidemia No date: Hypertension No date: Osteoarthritis     Comment:  , with mild meniscus tear, followed by orthopedics--Dr               Darrelyn Hillock 04/29/10 No date: Right knee pain No date: Seizures (HCC)     Comment:  as child No date: STEMI (ST elevation myocardial infarction) (HCC) No date: Vitamin D deficiency   HPI  CHF: Patient is on Lasix 80 mg BID and has follow up with cardiology. Patient continues on 3L O2 daily and 4L at night with CPAP. Patient does have OSA.  Has been participating in pulmonary rehab.   Diabetes: Hx of poorly controlled diabetes. A1c today is 6.5. Taking farxiga 10 mg daily, ozempic, metformin 1000 mg BID. CBGs were mostly within goal. Patient no longer taking glipizide.    Aortic atherosclerosis noted on CT On aspirin 81 mg daily. Has prior allergies to pravastatin, rosuvastatin, and atorvastatin. Lipid panel here WNL w/ exception of LDL 140. Consulted with cardiology. Continues on fish oil currently.    IDA Hemoglobin had dropped at last lab check.  We will recheck today.  On Ferrous sulfate 325 mg daily at home.    Denies f/c/s, n/v/d, hemoptysis, PND, leg swelling Denies chest pain or edema    Allergies  Allergen Reactions   Beef-Derived Drug Products     Patient does not eat beef   Crestor  [Rosuvastatin]     MUSCLE ACHES HEADACHES    Diltiazem Hives   Hydrocodone Itching   Other Hives, Swelling and Other (See Comments)    Vision changes; ALLERGIC TO ALL NUTS EXCEPT PEANUTS   Oxycodone Itching   Pork-Derived Products     For dietary, patient does not eat pork   Pravachol [Pravastatin Sodium] Itching    Joint pain   Simvastatin Hives    Immunization History  Administered Date(s) Administered   Pneumococcal Polysaccharide-23 03/01/2017   Tdap 03/01/2017    Tobacco History: Social History   Tobacco Use  Smoking Status Never  Smokeless Tobacco Never   Counseling given: Not Answered   Outpatient Encounter Medications as of 10/20/2023  Medication Sig   acetaminophen (TYLENOL) 500 MG tablet Take 500 mg by mouth every 6 (six) hours as needed for mild pain or moderate pain.   amLODipine (NORVASC) 5 MG tablet Take 1 tablet (5 mg total) by mouth daily.   aspirin 81 MG EC tablet Take 1 tablet (81 mg total) by mouth daily.   blood glucose meter kit and supplies KIT Dispense based on patient and insurance preference. Use up to four times daily as directed. (FOR ICD-9 250.00, 250.01).   Blood Glucose Monitoring Suppl (ACCU-CHEK GUIDE) w/Device KIT  1 each by Does not apply route in the morning, at noon, in the evening, and at bedtime.   carvedilol (COREG) 3.125 MG tablet Take 1 tablet (3.125 mg total) by mouth 2 (two) times daily with a meal.   dapagliflozin propanediol (FARXIGA) 10 MG TABS tablet Take 1 tablet by mouth once daily   ferrous sulfate 325 (65 FE) MG tablet Take 1 tablet (325 mg total) by mouth daily with breakfast.   folic acid (FOLVITE) 800 MCG tablet Take 0.5 tablets (400 mcg total) by mouth daily.   furosemide (LASIX) 80 MG tablet Take 1 tablet by mouth twice daily   glucose blood (ACCU-CHEK GUIDE) test strip Use as instructed to check sugar 4 times daily.   Lancets (ACCU-CHEK MULTICLIX) lancets Use as instructed to check blood sugar 4 times daily.    ofloxacin (FLOXIN) 0.3 % OTIC solution Place 5 drops into the right ear daily.   Omega-3 Fatty Acids (FISH OIL) 1000 MG CAPS Take 1,000 mg by mouth daily.   potassium chloride SA (KLOR-CON M) 20 MEQ tablet Take 1 tablet (20 mEq total) by mouth daily.   sacubitril-valsartan (ENTRESTO) 97-103 MG Take 1 tablet by mouth 2 (two) times daily.   Semaglutide, 2 MG/DOSE, (OZEMPIC, 2 MG/DOSE,) 8 MG/3ML SOPN Inject 2 mg into the skin once a week.   spironolactone (ALDACTONE) 25 MG tablet Take 1 tablet by mouth once daily   No facility-administered encounter medications on file as of 10/20/2023.    Review of Systems  Review of Systems  Constitutional: Negative.   HENT: Negative.    Cardiovascular: Negative.   Gastrointestinal: Negative.   Allergic/Immunologic: Negative.   Neurological: Negative.   Psychiatric/Behavioral: Negative.       Objective:   Pulse 82   Temp (!) 97.1 F (36.2 C)   Wt 272 lb 9.6 oz (123.7 kg)   LMP 05/09/2012   SpO2 100%   BMI 41.45 kg/m   Wt Readings from Last 5 Encounters:  10/20/23 272 lb 9.6 oz (123.7 kg)  09/16/23 266 lb (120.7 kg)  06/03/23 252 lb 6.4 oz (114.5 kg)  05/17/23 252 lb 3.2 oz (114.4 kg)  04/26/23 250 lb 3.2 oz (113.5 kg)     Physical Exam Vitals and nursing note reviewed.  Constitutional:      General: She is not in acute distress.    Appearance: She is well-developed.  Cardiovascular:     Rate and Rhythm: Normal rate and regular rhythm.  Pulmonary:     Effort: Pulmonary effort is normal.     Breath sounds: Normal breath sounds.  Neurological:     Mental Status: She is alert and oriented to person, place, and time.       Assessment & Plan:   Diabetic retinopathy of both eyes associated with type 2 diabetes mellitus, macular edema presence unspecified, unspecified retinopathy severity (HCC) -     POCT glycosylated hemoglobin (Hb A1C) -     CBC -     Comprehensive metabolic panel  Lipid screening -     Lipid  panel  Chronic otitis externa of right ear, unspecified type -     Ambulatory referral to ENT -     Ofloxacin; Place 5 drops into the right ear daily.  Dispense: 5 mL; Refill: 0     Return in about 3 months (around 01/18/2024).     Ivonne Andrew, NP 10/20/2023

## 2023-10-21 ENCOUNTER — Other Ambulatory Visit: Payer: Self-pay | Admitting: Nurse Practitioner

## 2023-10-21 LAB — COMPREHENSIVE METABOLIC PANEL
ALT: 12 [IU]/L (ref 0–32)
AST: 16 [IU]/L (ref 0–40)
Albumin: 4.3 g/dL (ref 3.8–4.9)
Alkaline Phosphatase: 95 [IU]/L (ref 44–121)
BUN/Creatinine Ratio: 19 (ref 9–23)
BUN: 14 mg/dL (ref 6–24)
Bilirubin Total: 0.5 mg/dL (ref 0.0–1.2)
CO2: 27 mmol/L (ref 20–29)
Calcium: 9.9 mg/dL (ref 8.7–10.2)
Chloride: 98 mmol/L (ref 96–106)
Creatinine, Ser: 0.73 mg/dL (ref 0.57–1.00)
Globulin, Total: 3.4 g/dL (ref 1.5–4.5)
Glucose: 101 mg/dL — ABNORMAL HIGH (ref 70–99)
Potassium: 4.3 mmol/L (ref 3.5–5.2)
Sodium: 141 mmol/L (ref 134–144)
Total Protein: 7.7 g/dL (ref 6.0–8.5)
eGFR: 96 mL/min/{1.73_m2} (ref >59)

## 2023-10-21 LAB — CBC
Hematocrit: 36.9 % (ref 34.0–46.6)
Hemoglobin: 11.3 g/dL (ref 11.1–15.9)
MCH: 23 pg — ABNORMAL LOW (ref 26.6–33.0)
MCHC: 30.6 g/dL — ABNORMAL LOW (ref 31.5–35.7)
MCV: 75 fL — ABNORMAL LOW (ref 79–97)
Platelets: 373 10*3/uL (ref 150–450)
RBC: 4.91 x10E6/uL (ref 3.77–5.28)
RDW: 14.6 % (ref 11.7–15.4)
WBC: 6 10*3/uL (ref 3.4–10.8)

## 2023-10-21 LAB — LIPID PANEL
Chol/HDL Ratio: 4.6 {ratio} — ABNORMAL HIGH (ref 0.0–4.4)
Cholesterol, Total: 229 mg/dL — ABNORMAL HIGH (ref 100–199)
HDL: 50 mg/dL (ref >39)
LDL Chol Calc (NIH): 165 mg/dL — ABNORMAL HIGH (ref 0–99)
Triglycerides: 79 mg/dL (ref 0–149)
VLDL Cholesterol Cal: 14 mg/dL (ref 5–40)

## 2023-11-01 ENCOUNTER — Other Ambulatory Visit: Payer: Self-pay | Admitting: Nurse Practitioner

## 2023-11-08 ENCOUNTER — Other Ambulatory Visit: Payer: Self-pay

## 2023-11-08 DIAGNOSIS — I252 Old myocardial infarction: Secondary | ICD-10-CM

## 2023-11-08 DIAGNOSIS — E7800 Pure hypercholesterolemia, unspecified: Secondary | ICD-10-CM

## 2023-11-08 MED ORDER — NEXLIZET 180-10 MG PO TABS
1.0000 | ORAL_TABLET | Freq: Every day | ORAL | 11 refills | Status: AC
Start: 2023-11-08 — End: ?

## 2023-11-08 NOTE — Progress Notes (Signed)
11/08/2023 Name: Claudia [name] MRN: [id number] DOB: 04-21-1966  No chief complaint on file.   Claudia [name] is a 58 y.o. year old female who presented for a telephone visit.   They were referred to the pharmacist by their PCP for assistance in managing diabetes, hypertension, and hyperlipidemia.    Subjective:  Care Team: Primary Care Provider: Ivonne Andrew, NP ; Next Scheduled Visit: 01/19/24 Cardiologist: Dr. Gala Romney; Next Scheduled Visit: not scheduled  Medication Access/Adherence  Current Pharmacy:  [facility] [address], [place] - [address] [place] [address] Phone: [phone] Fax: [phone]  [facility] [address], [address] [place] [address] Phone: [phone] Fax: [phone]  [facility] [address] [place] [address] Phone: [phone] Fax: [phone]   Patient reports affordability concerns with their medications: No  Patient reports access/transportation concerns to their pharmacy: No  Patient reports adherence concerns with their medications:  No     Diabetes:  Current medications: Farxiga 10 mg daily, Ozempic 2 mg weekly  Using glucometer; testing two times daily FBG: 90-150 After lunch: 120-150  Patient denies hypoglycemic s/sx including dizziness, shakiness, sweating. Patient denies hyperglycemic symptoms including polyuria, polydipsia, polyphagia, nocturia, neuropathy, blurred vision  Hyperlipidemia/ASCVD Risk Reduction  Current lipid lowering medications: fish oil 1000 mg daily Medications tried in the past: atorvastatin 10 mg, rosuvastatin 5-10 mg, pravastatin - myalgias simvastatin - hives  Antiplatelet regimen: ASA 81 mg daily  ASCVD History: NSTEMI Risk Factors: diabetes, HLD, obesity   Heart Failure (EF 55%):  Current medications:  ACEi/ARB/ARNI:  Entresto 97-103 mg BID SGLT2i: Farxiga 10 mg daily Beta blocker: carvedilol 3.125 mg BID Mineralocorticoid Receptor Antagonist: spironolactone 25 mg daily Diuretic regimen: furosemide 80 mg BID Other: Potassium 20 mEq daily, amlodipine 5 mg daily  Current home blood pressure readings: recalls one reading of 179/68, but did not rest prior to checking and sits with legs crossed Current home weights: 260 lbs  Patient denies volume overload signs or symptoms including shortness of breath, lower extremity edema, increased use of pillows at night. Does feel SOB when walking and says she "takes her time".   Objective:  Lab Results  Component Value Date   HGBA1C 6.5 (A) 10/20/2023    Lab Results  Component Value Date   CREATININE 0.73 10/20/2023   BUN 14 10/20/2023   NA 141 10/20/2023   K 4.3 10/20/2023   CL 98 10/20/2023   CO2 27 10/20/2023    Lab Results  Component Value Date   CHOL 229 (H) 10/20/2023   HDL 50 10/20/2023   LDLCALC 165 (H) 10/20/2023   TRIG 79 10/20/2023   CHOLHDL 4.6 (H) 10/20/2023    Medications Reviewed Today     Reviewed by Vela Prose, RPH (Pharmacist) on 11/08/23 at 1018  Med List Status: <None>   Medication Order Taking? Sig Documenting Provider Last Dose Status Informant  acetaminophen (TYLENOL) 500 MG tablet [id number] Yes Take 500 mg by mouth every 6 (six) hours as needed for mild pain or moderate pain. [provider] Taking Active Self  amLODipine (NORVASC) 5 MG tablet [id number] Yes Take 1 tablet (5 mg total) by mouth daily. Ivonne Andrew, NP Taking Active   aspirin 81 MG EC tablet [id number] Yes Take 1 tablet (81 mg total) by mouth daily. Kallie Locks, FNP Taking Active Self  blood glucose meter kit and supplies  KIT [id number]  Dispense based on patient and insurance preference. Use up to four times daily as directed. (FOR ICD-9 250.00, 250.01). Kallie Locks, FNP  Active Self  Blood Glucose Monitoring Suppl (ACCU-CHEK  GUIDE) w/Device KIT [id number]  1 each by Does not apply route in the morning, at noon, in the evening, and at bedtime. Ivonne Andrew, NP  Active Self  carvedilol (COREG) 3.125 MG tablet [id number] Yes Take 1 tablet (3.125 mg total) by mouth 2 (two) times daily with a meal. Clegg, Amy D, NP Taking Active   dapagliflozin propanediol (FARXIGA) 10 MG TABS tablet [id number] Yes Take 1 tablet by mouth once daily Ivonne Andrew, NP Taking Active   ferrous sulfate 325 (65 FE) MG tablet [id number] Yes Take 1 tablet (325 mg total) by mouth daily with breakfast. Ivonne Andrew, NP Taking Active   folic acid (FOLVITE) 800 MCG tablet [id number] Yes Take 0.5 tablets (400 mcg total) by mouth daily. Ivonne Andrew, NP Taking Active   furosemide (LASIX) 80 MG tablet [id number] Yes Take 1 tablet by mouth twice daily Ivonne Andrew, NP Taking Active   glucose blood (ACCU-CHEK GUIDE) test strip [id number]  Use as instructed to check sugar 4 times daily. Ivonne Andrew, NP  Active Self  Lancets (ACCU-CHEK MULTICLIX) lancets [id number]  Use as instructed to check blood sugar 4 times daily. Ivonne Andrew, NP  Active Self  ofloxacin (FLOXIN) 0.3 % OTIC solution [id number] Yes Place 5 drops into the right ear daily. Ivonne Andrew, NP Taking Active   Omega-3 Fatty Acids (FISH OIL) 1000 MG CAPS [id number] Yes Take 1,000 mg by mouth daily. [provider] Taking Active Self  potassium chloride SA (KLOR-CON M) 20 MEQ tablet [id number] Yes Take 1 tablet (20 mEq total) by mouth daily. Bensimhon, Bevelyn Buckles, MD Taking Active   sacubitril-valsartan (ENTRESTO) 97-103 MG [id number] Yes Take 1 tablet by mouth 2 (two) times daily. Bensimhon, Bevelyn Buckles, MD Taking Active            Med Note Trevor Iha, Geroge Baseman   Tue Jul 13, 2023 10:23 AM)    Semaglutide, 2 MG/DOSE, (OZEMPIC, 2 MG/DOSE,) 8 MG/3ML SOPN [id number] Yes Inject 2 mg into the skin once a week. Ivonne Andrew, NP Taking Active   spironolactone (ALDACTONE) 25 MG tablet  [id number] Yes Take 1 tablet by mouth once daily Bensimhon, Bevelyn Buckles, MD Taking Active               Assessment/Plan:   Diabetes: - Currently controlled based on last A1c 6.5% on 10/20/23 - Reviewed long term cardiovascular and renal outcomes of uncontrolled blood sugar - Reviewed goal A1c, goal fasting, and goal 2 hour post prandial glucose - Reviewed dietary modifications including limiting carbohydrates and sugary foods - Recommend to continue Farxiga 10 mg daily and Ozempic 2 mg weekly  - Recommend to check FBG and 2 hr PP BG daily    Hyperlipidemia/ASCVD Risk Reduction: - Currently uncontrolled based on last LDL 165 mg/dL, above goal <62 mg/dL given hx of NSTEMI with risk factors diabetes, hypertension, obesity. She has a history of multiple statin intolerances due to muscle aches/joint pain. Discussed initiation of Repatha, but patient prefers to avoid injectable medication. Discussed initiation of Nexlizet and she was amenable to this.  - Reviewed long term complications of uncontrolled cholesterol - Reviewed dietary recommendations including limiting intake of processed foods - Recommend to start Nexlizet 180-10 mg once daily. Will collaborate with PCP to  implement medication change. PA submitted - Key [id number].   Heart Failure: - Currently  [facility] by [facility]  - Patient able to recall one BP reading that was elevated, however she was not following proper BP check technique. Reviewed appropriate blood pressure monitoring technique and reviewed goal blood pressure. Counseled patient to check BP daily and if readings are consistently >130/80 notify HF clinic. - Reviewed to weigh daily and when to contact cardiology with weight gain - Reviewed dietary modifications including limiting fluid and salt intake    Follow Up Plan: PharmD on 12/20/23 and PCP on 01/19/24  Jarrett Ables, PharmD PGY-1 Pharmacy Resident

## 2023-11-09 ENCOUNTER — Telehealth: Payer: Self-pay | Admitting: Pharmacist

## 2023-11-09 NOTE — Progress Notes (Signed)
PA for Nexlizet denied by insurance (Key [id number]). Will forward to med access team to submit for appeal. Please submit information including patient with history of NSTEMI and most recent LDL 165 mg/dL which is above goal <65 mg/dL given her history of NSTEMI and risk factors T2DM, obesity, hyperlipidemia. Patient also unable to tolerate statins - pravastatin (joint pain), rosuvastatin 5-10 mg (muscle aches), simvastatin (hives). Likely will not reach LDL goal with ezetimibe alone given this only lowers LDL 15-20% indicating the need for combination therapy with Nexlizet (bempedoic acid/ezetimibe).   Jarrett Ables, PharmD PGY-1 Pharmacy Resident

## 2023-11-11 ENCOUNTER — Encounter: Payer: Self-pay | Admitting: Pharmacist

## 2023-11-15 ENCOUNTER — Other Ambulatory Visit (HOSPITAL_COMMUNITY): Payer: Self-pay | Admitting: Cardiology

## 2023-11-15 MED ORDER — POTASSIUM CHLORIDE CRYS ER 20 MEQ PO TBCR: 20.0000 meq | EXTENDED_RELEASE_TABLET | Freq: Every day | ORAL | 3 refills | Status: AC

## 2023-11-22 ENCOUNTER — Other Ambulatory Visit (HOSPITAL_COMMUNITY): Payer: Self-pay

## 2023-11-23 ENCOUNTER — Encounter (HOSPITAL_COMMUNITY): Payer: Self-pay

## 2023-11-24 ENCOUNTER — Encounter (HOSPITAL_COMMUNITY): Payer: Self-pay

## 2023-11-24 ENCOUNTER — Telehealth: Payer: Self-pay

## 2023-11-24 ENCOUNTER — Other Ambulatory Visit: Payer: Self-pay

## 2023-11-24 ENCOUNTER — Encounter: Payer: Self-pay | Admitting: Pharmacist

## 2023-11-24 ENCOUNTER — Other Ambulatory Visit (HOSPITAL_COMMUNITY): Payer: Self-pay

## 2023-11-24 NOTE — Telephone Encounter (Signed)
 Copied from CRM [id number]. Topic: Clinical - Medication Question >> Nov 24, 2023  9:45 AM Clayton Bibles wrote: Reason for CRM: Medicaid has questions about the appeal for medication Nexlizet. They would like a nurse to call back as soon as possible.  Please call [phone]  Please advise [facility]

## 2023-11-26 ENCOUNTER — Other Ambulatory Visit (HOSPITAL_BASED_OUTPATIENT_CLINIC_OR_DEPARTMENT_OTHER): Payer: Self-pay

## 2023-11-28 ENCOUNTER — Other Ambulatory Visit: Payer: Self-pay | Admitting: Nurse Practitioner

## 2023-11-28 DIAGNOSIS — D649 Anemia, unspecified: Secondary | ICD-10-CM

## 2023-12-07 ENCOUNTER — Other Ambulatory Visit: Payer: Self-pay | Admitting: Nurse Practitioner

## 2023-12-07 ENCOUNTER — Telehealth: Payer: Self-pay | Admitting: Pharmacist

## 2023-12-07 NOTE — Progress Notes (Signed)
 Successfully outreached patient to notify her that Nexlizet was approved by her insurance. Counseled her to contact [facility] to request they fill the medication and she expressed understanding. Reminded her of upcoming pharmacist appointment on 12/20/23.  Jarrett Ables, PharmD PGY-1 Pharmacy Resident

## 2023-12-10 ENCOUNTER — Telehealth (HOSPITAL_COMMUNITY): Payer: Self-pay

## 2023-12-10 NOTE — Telephone Encounter (Addendum)
 Called and left patient a voice message to confirm/remind patient of their appointment at the [facility] on 12/13/23.   And to bring in all medications and/or complete list.

## 2023-12-10 NOTE — Progress Notes (Signed)
 [facility]   Primary Care:Angus Seller NP  Primary Cardiologist: Dr Anne Fu  HF MD: Dr Leory Plowman  HPI: Ms Planck is a 58 y.o. with morbid obesity, HFpEF, NSVT, DMII, IDA, and HTN.   Cath 2019 for CP with normal cors   Admitted 11/23 with A/C HFpEF. Discharge weight 278 pounds.   She was seen HF TOC 08/26/22 and set up for RHC. (See below)  Sleep Study - Severe OSA. AHI 60 and nocturnal hypoxia.   RHC 09/16/22 - Mild mixed pulmonary with mildly elevated left-sided filling pressures.  Admitted 3/24 with a/c dHF. Diuresed and lasix increased to 80 bid. D/c weight 247  Echo 45% with septal HK D shaped ventricle  Zio 4/24 Sinus. 2.7% PVCs  Echo 05/17/23 EF 60-65% small circumferential effusion. RV ok   PYP 11/24 equivocal --> cMRI pending.  Today she returns for HF follow up with her husband. Overall feeling fair. Has "good days and bad days", on bad days she feels drained with poor energy. Wears 3L oxygen during the day and CPAP w/ 4 L O2 at night. She has mild SOB walking on flat ground, does OK with ADLs. Feels occasional palpitations. Denies abnormal bleeding, CP, edema, or PND/Orthopnea. Appetite ok. No fever or chills. Weight at home 270 pounds. Taking all medications. cMRI arranged 12/20/23, applying for disability. Finished Pulm Rehab. Drinking a lot of fluids. Asking for handicap placard today.  Cardiac Testing  - PYP 11/24: equivocal  - Echo 8/24: EF 60-65%, small circumferential effusion, RV ok  - RHC 09/16/22  RA = 2 RV = 46/5 PA = 48/30 (37) PCW = 20 Fick cardiac output/index = 7.4/3.2 Thermo CO/CI = 8.7/3.8 PVR = 2.3 WU Ao sat = 93% PA sat = 70%, 71% 1. Mild mixed pulmonary with mildly elevated left-sided filling pressures Continue medical therapy and weight loss efforts. No role for selective pulmonary vasodilators.   Past Medical History:  Diagnosis Date   Diabetes mellitus    Fibroids    Headache(784.0)    History of echocardiogram     Echo 7/18: Moderate concentric LVH, EF 55-60, normal wall motion, grade 1 diastolic dysfunction, calcified aortic leaflets   Hypercholesterolemia    Hyperlipidemia    Hypertension    Osteoarthritis    , with mild meniscus tear, followed by orthopedics--Dr Darrelyn Hillock 04/29/10   Right knee pain    Seizures (HCC)    as child   STEMI (ST elevation myocardial infarction) (HCC)    Vitamin D deficiency    Current Outpatient Medications  Medication Sig Dispense Refill   acetaminophen (TYLENOL) 500 MG tablet Take 500 mg by mouth every 6 (six) hours as needed for mild pain or moderate pain.     amLODipine (NORVASC) 5 MG tablet Take 1 tablet (5 mg total) by mouth daily. 180 tablet 0   aspirin 81 MG EC tablet Take 1 tablet (81 mg total) by mouth daily. 30 tablet 6   Bempedoic Acid-Ezetimibe (NEXLIZET) 180-10 MG TABS Take 1 tablet by mouth daily. 30 tablet 11   blood glucose meter kit and supplies KIT Dispense based on patient and insurance preference. Use up to four times daily as directed. (FOR ICD-9 250.00, 250.01). 1 each 0   Blood Glucose Monitoring Suppl (ACCU-CHEK GUIDE) w/Device KIT 1 each by Does not apply route in the morning, at noon, in the evening, and at bedtime. 1 kit 0   carvedilol (COREG) 3.125 MG tablet Take 1 tablet (3.125 mg total) by mouth  2 (two) times daily with a meal. 60 tablet 8   dapagliflozin propanediol (FARXIGA) 10 MG TABS tablet Take 1 tablet by mouth once daily 60 tablet 0   Ferrous Sulfate (IRON) 325 (65 Fe) MG TABS Take 1 tablet by mouth once daily with breakfast 30 tablet 0   folic acid (FOLVITE) 800 MCG tablet Take 0.5 tablets (400 mcg total) by mouth daily. 30 tablet 2   furosemide (LASIX) 80 MG tablet Take 1 tablet by mouth twice daily 120 tablet 0   glucose blood (ACCU-CHEK GUIDE) test strip Use as instructed to check sugar 4 times daily. 200 each 6   Lancets (ACCU-CHEK MULTICLIX) lancets Use as instructed to check blood sugar 4 times daily. 204 each 12   ofloxacin  (FLOXIN) 0.3 % OTIC solution Place 5 drops into the right ear daily. (Patient taking differently: Place 5 drops into the right ear daily as needed.) 5 mL 0   potassium chloride SA (KLOR-CON M) 20 MEQ tablet Take 1 tablet (20 mEq total) by mouth daily. 90 tablet 3   sacubitril-valsartan (ENTRESTO) 97-103 MG Take 1 tablet by mouth 2 (two) times daily. 60 tablet 7   Semaglutide, 2 MG/DOSE, (OZEMPIC, 2 MG/DOSE,) 8 MG/3ML SOPN Inject 2 mg into the skin once a week. (Patient taking differently: Inject 2 mg into the skin once a week. Fridays) 3 mL 11   spironolactone (ALDACTONE) 25 MG tablet Take 1 tablet by mouth once daily 90 tablet 3   No current facility-administered medications for this encounter.   Allergies  Allergen Reactions   Beef-Derived Drug Products     Patient does not eat beef   Crestor [Rosuvastatin]     MUSCLE ACHES HEADACHES    Diltiazem Hives   Hydrocodone Itching   Other Hives, Swelling and Other (See Comments)    Vision changes; ALLERGIC TO ALL NUTS EXCEPT PEANUTS   Oxycodone Itching   Pork-Derived Products     For dietary, patient does not eat pork   Pravachol [Pravastatin Sodium] Itching    Joint pain   Simvastatin Hives   Social History   Socioeconomic History   Marital status: Married    Spouse name: Aneta Mins   Number of children: 6   Years of education: Not on file   Highest education level: High school graduate  Occupational History   Occupation: Nutrition @ [employer]    Comment: applying for disability  Tobacco Use   Smoking status: Never   Smokeless tobacco: Never  Vaping Use   Vaping status: Never Used  Substance and Sexual Activity   Alcohol use: No   Drug use: No   Sexual activity: Yes  Other Topics Concern   Not on file  Social History Narrative   Not on file   Social Drivers of Health   Financial Resource Strain: Medium Risk (08/14/2022)   Overall Financial Resource Strain (CARDIA)    Difficulty of Paying Living Expenses: Somewhat  hard  Food Insecurity: No Food Insecurity (12/02/2022)   Hunger Vital Sign    Worried About Running Out of Food in the Last Year: Never true    Ran Out of Food in the Last Year: Never true  Transportation Needs: No Transportation Needs (12/02/2022)   PRAPARE - Administrator, Civil Service (Medical): No    Lack of Transportation (Non-Medical): No  Physical Activity: Not on file  Stress: Not on file  Social Connections: Not on file  Intimate Partner Violence: Not At Risk (12/02/2022)  Humiliation, Afraid, Rape, and Kick questionnaire    Fear of Current or Ex-Partner: No    Emotionally Abused: No    Physically Abused: No    Sexually Abused: No   Family History  Problem Relation Age of Onset   Hypertension Mother    Diabetes Mother    Hypercholesterolemia Mother    Hypertension Sister    CVA Maternal Grandmother    Hypertension Maternal Grandmother    Diabetes Maternal Grandmother    Heart attack Neg Hx    Breast cancer Neg Hx    Wt Readings from Last 3 Encounters:  12/13/23 127 kg (280 lb)  10/20/23 123.7 kg (272 lb 9.6 oz)  09/16/23 120.7 kg (266 lb)   BP 128/84   Pulse 88   Wt 127 kg (280 lb)   LMP 05/09/2012   SpO2 100% Comment: Patient is on 3-liters of room oxygen.  BMI 42.57 kg/m   PHYSICAL EXAM: General:  NAD. No resp difficulty, walked into clinic wearing O2, fatigued-appearing HEENT: Normal Neck: Supple. No JVD. Cor: Regular rate & rhythm. No rubs, gallops or murmurs. Lungs: Clear Abdomen: Soft, obese, nontender, nondistended.  Extremities: No cyanosis, clubbing, rash, edema Neuro: Alert & oriented x 3, moves all 4 extremities w/o difficulty. Affect pleasant.  ECG (personally reviewed): ST with PVCs 83 bpm  ASSESSMENT & PLAN:  1. Chronic HFmrEF and RV moderately reduced - Echo 2019 EF 55-60% with normal RV.  - Echo 07/2022 EF 55-60% & RV moderately reduced.  - Echo 3/24: EF 45% with septal HK D-shape septum - LHC 2019 normal cors - RHC 12/23  RA 2 PA 48/30 (37) PCW 20 Fick 7.4/3.2 Thermo 8.7/3.8 PVR 2.3 WU - Echo 05/17/23 EF 60-65% small circumfrential effusion. RV ok -> RV strain on echo no longer present   - PYP 11/24 equivocal; cMRI pending 12/20/23 - NYHA III, suspect chronic at this point. Functional status confounded by body habitus and pulmonary issues. Volume ok today, weight is up >25 lbs in 7 months, she attributes this to home stress/dietary indiscretion (see below) - Continue Lasix 80 mg bid + 20 KCL daily. Watch for overdiuresis - Continue Entresto 97/103 bid - Continue spiro 25 mg daily.  - Continue Farxiga 10 mg daily. - Continue Coreg 3.125 mg bid - Labs today.  2. PAH, mild (post-capillary) - Evidence of RV strain on echo has now resolved with supplemental O2 and BiPAP - Suspect WHO GROUP 2 (HF) and 3 (OSA/OHS) - VQ scan April 2023 which was negative. - Continue BIPAP - Continue weight loss (on Ozempic) - She graduate Pulm Rehab  3. Chronic hypoxic respiratory failure - Mostly due to OHS - Continue O2, bipap and weight loss - Finished Pulm rehab  4. HTN - Blood pressure well controlled.  - Continue current regimen.  5. Morbid Obesity  - Body mass index is 42.57 kg/m. - She is on max dose semaglutide, but weight is up >25 lbs over past 7 months. - ? If switch to tirzepatide would result in more meaningful weight loss, will reach out to PharmD about switch  6. DMII - Most recent Hgb A1C 6.5  - On Farxiga 10 mg daily.  - per PCP  7. Sleep Apnea, severe - AHI 60 and nocturnal hypoxia.  - Continue BIPAP and weight loss efforts  8. PVCs, frequent - Zio 4/24 Sinus. 2.7% PVCs - 2 PVCs on ECG today. - Continue BiPap - Check labs today   Follow up in 6 months with  Dr. Gala Romney.   Anderson Malta Kimberla Driskill FNP-BC 8:38 AM

## 2023-12-13 ENCOUNTER — Encounter (HOSPITAL_COMMUNITY): Payer: Self-pay

## 2023-12-13 ENCOUNTER — Telehealth: Payer: Self-pay | Admitting: Pharmacy Technician

## 2023-12-13 ENCOUNTER — Other Ambulatory Visit (HOSPITAL_COMMUNITY): Payer: Self-pay

## 2023-12-13 ENCOUNTER — Ambulatory Visit (HOSPITAL_COMMUNITY)
Admission: RE | Admit: 2023-12-13 | Discharge: 2023-12-13 | Disposition: A | Discharge status: Home / Self Care | Payer: Medicaid Other | Source: Ambulatory Visit | Attending: Family Medicine | Admitting: Family Medicine

## 2023-12-13 VITALS — BP 128/84 | HR 88 | Wt 280.0 lb

## 2023-12-13 DIAGNOSIS — J9610 Chronic respiratory failure, unspecified whether with hypoxia or hypercapnia: Secondary | ICD-10-CM | POA: Insufficient documentation

## 2023-12-13 DIAGNOSIS — I272 Pulmonary hypertension, unspecified: Secondary | ICD-10-CM | POA: Diagnosis not present

## 2023-12-13 DIAGNOSIS — D509 Iron deficiency anemia, unspecified: Secondary | ICD-10-CM | POA: Diagnosis not present

## 2023-12-13 DIAGNOSIS — E119 Type 2 diabetes mellitus without complications: Secondary | ICD-10-CM | POA: Insufficient documentation

## 2023-12-13 DIAGNOSIS — G4734 Idiopathic sleep related nonobstructive alveolar hypoventilation: Secondary | ICD-10-CM | POA: Diagnosis not present

## 2023-12-13 DIAGNOSIS — Z7984 Long term (current) use of oral hypoglycemic drugs: Secondary | ICD-10-CM | POA: Insufficient documentation

## 2023-12-13 DIAGNOSIS — I493 Ventricular premature depolarization: Secondary | ICD-10-CM | POA: Insufficient documentation

## 2023-12-13 DIAGNOSIS — Z6841 Body Mass Index (BMI) 40.0 and over, adult: Secondary | ICD-10-CM | POA: Insufficient documentation

## 2023-12-13 DIAGNOSIS — E1159 Type 2 diabetes mellitus with other circulatory complications: Secondary | ICD-10-CM

## 2023-12-13 DIAGNOSIS — I10 Essential (primary) hypertension: Secondary | ICD-10-CM | POA: Diagnosis not present

## 2023-12-13 DIAGNOSIS — Z7985 Long-term (current) use of injectable non-insulin antidiabetic drugs: Secondary | ICD-10-CM | POA: Insufficient documentation

## 2023-12-13 DIAGNOSIS — Z9981 Dependence on supplemental oxygen: Secondary | ICD-10-CM | POA: Diagnosis not present

## 2023-12-13 DIAGNOSIS — I110 Hypertensive heart disease with heart failure: Secondary | ICD-10-CM | POA: Insufficient documentation

## 2023-12-13 DIAGNOSIS — G4733 Obstructive sleep apnea (adult) (pediatric): Secondary | ICD-10-CM | POA: Diagnosis not present

## 2023-12-13 DIAGNOSIS — R9431 Abnormal electrocardiogram [ECG] [EKG]: Secondary | ICD-10-CM | POA: Insufficient documentation

## 2023-12-13 DIAGNOSIS — I5032 Chronic diastolic (congestive) heart failure: Secondary | ICD-10-CM | POA: Diagnosis not present

## 2023-12-13 DIAGNOSIS — J9611 Chronic respiratory failure with hypoxia: Secondary | ICD-10-CM

## 2023-12-13 DIAGNOSIS — I4729 Other ventricular tachycardia: Secondary | ICD-10-CM | POA: Insufficient documentation

## 2023-12-13 LAB — BRAIN NATRIURETIC PEPTIDE: B Natriuretic Peptide: 5.8 pg/mL (ref 0.0–100.0)

## 2023-12-13 LAB — BASIC METABOLIC PANEL
Anion gap: 11 (ref 5–15)
BUN: 14 mg/dL (ref 6–20)
CO2: 28 mmol/L (ref 22–32)
Calcium: 9.4 mg/dL (ref 8.9–10.3)
Chloride: 99 mmol/L (ref 98–111)
Creatinine, Ser: 0.78 mg/dL (ref 0.44–1.00)
GFR, Estimated: >60 mL/min (ref >60)
Glucose, Bld: 108 mg/dL — ABNORMAL HIGH (ref 70–99)
Potassium: 3.8 mmol/L (ref 3.5–5.1)
Sodium: 138 mmol/L (ref 135–145)

## 2023-12-13 MED ORDER — DAPAGLIFLOZIN PROPANEDIOL 10 MG PO TABS
10.0000 mg | ORAL_TABLET | Freq: Every day | ORAL | 3 refills | Status: AC
Start: 2023-12-13 — End: ?

## 2023-12-13 MED ORDER — FUROSEMIDE 80 MG PO TABS: 80.0000 mg | ORAL_TABLET | Freq: Two times a day (BID) | ORAL | 3 refills | Status: AC

## 2023-12-13 MED ORDER — AMLODIPINE BESYLATE 5 MG PO TABS: 5.0000 mg | ORAL_TABLET | Freq: Every day | ORAL | 3 refills | Status: DC

## 2023-12-13 NOTE — Telephone Encounter (Signed)
 Pharmacy Patient Advocate Encounter   Received notification from  staff messages  that prior authorization for mounjaro is required/requested.   Insurance verification completed.   The patient is insured through [facility] [place] .   Per test claim: PA required; PA started via CoverMyMeds. KEY [id number] . Please see clinical question(s) below that I am not finding the answer to in her chart and advise.     I saw your note: "Cheree Ditto, [facility]  P Rx Prior Auth Team Please complete a PA for [employer]. Ozempic not effective, Please use T2DM diganosis" but they are asking about two

## 2023-12-13 NOTE — Patient Instructions (Signed)
Thank you for coming in today  If you had labs drawn today, any labs that are abnormal the clinic will call you No news is good news  Medications:   Follow up appointments:  Your physician recommends that you schedule a follow-up appointment in:     Do the following things EVERYDAY: Weigh yourself in the morning before breakfast. Write it down and keep it in a log. Take your medicines as prescribed Eat low salt foods--Limit salt (sodium) to 2000 mg per day.  Stay as active as you can everyday Limit all fluids for the day to less than 2 liters   At the [facility], you and your health needs are our priority. As part of our continuing mission to provide you with exceptional heart care, we have created designated Provider Care Teams. These Care Teams include your primary Cardiologist (physician) and Advanced Practice Providers (APPs- Physician Assistants and Nurse Practitioners) who all work together to provide you with the care you need, when you need it.   You may see any of the following providers on your designated Care Team at your next follow up: Dr Arvilla Meres Dr Marca Ancona Dr. Marcos Eke, NP Robbie Lis, [place] [facility] [place], [place] Brynda Peon, NP Karle Plumber, PharmD   Please be sure to bring in all your medications bottles to every appointment.    Thank you for choosing [facility]  If you have any questions or concerns before your next appointment please send [place] a message through [place] or call our office at [phone].    TO LEAVE A MESSAGE FOR THE NURSE SELECT OPTION 2, PLEASE LEAVE A MESSAGE INCLUDING: YOUR NAME DATE OF BIRTH CALL BACK NUMBER REASON FOR CALL**this is important as we prioritize the call backs  YOU WILL RECEIVE A CALL BACK THE SAME DAY AS LONG AS YOU CALL BEFORE 4:00 PM

## 2023-12-14 ENCOUNTER — Telehealth: Payer: Self-pay | Admitting: Pharmacy Technician

## 2023-12-14 ENCOUNTER — Other Ambulatory Visit (HOSPITAL_COMMUNITY): Payer: Self-pay

## 2023-12-14 DIAGNOSIS — E1169 Type 2 diabetes mellitus with other specified complication: Secondary | ICD-10-CM

## 2023-12-14 NOTE — Telephone Encounter (Signed)
 See new encounter for trulicity -it still needs a pa but they only asked if the pt failed metformin

## 2023-12-14 NOTE — Telephone Encounter (Signed)
 Pharmacy Patient Advocate Encounter  Received notification from [facility] Medicaid that Prior Authorization for trulicity has been APPROVED from 11/30/23 to 12/13/24 . I tried to run a test claim and it is still rejecting. I called insurance and they are expediting the request. She said it could take up to 24-48 hours. I'm sending to see if you wanted to go ahead and write the prescription and maybe their pharmacy won't have a problem running it.   PA #/Case ID/Reference #: [id number].

## 2023-12-14 NOTE — Telephone Encounter (Signed)
 Pharmacy Patient Advocate Encounter   Received notification from Pt Calls Messages that prior authorization for trulicity is required/requested.   Insurance verification completed.   The patient is insured through [facility] [place] .   Per test claim: PA required; PA submitted to above mentioned insurance via CoverMyMeds Key/confirmation #/EOC [id number] Status is pending

## 2023-12-15 ENCOUNTER — Other Ambulatory Visit (HOSPITAL_COMMUNITY): Payer: Self-pay

## 2023-12-15 ENCOUNTER — Other Ambulatory Visit: Payer: Self-pay

## 2023-12-15 MED ORDER — TRULICITY 0.75 MG/0.5ML ~~LOC~~ SOAJ
0.7500 mg | SUBCUTANEOUS | 0 refills | Status: DC
Start: 2023-12-15 — End: 2024-01-21
  Filled 2023-12-15: qty 2, 28d supply, fill #0

## 2023-12-15 NOTE — Telephone Encounter (Signed)
 I spoke to the insurance and they are registering that she just got ozempic on 12/04/23-I explained she is going to stop the ozempic. They said they have to put in a special override to get this to go through. They said it could take 48-72 hours to fix. He said hopefully today or tomorrow it will be fixed. They said they reach out to the pharmacy when the override is put in place.

## 2023-12-15 NOTE — Telephone Encounter (Signed)
 Trulcity 0.75mg  x once weekly sent to pharmacy. Ozempic d/c

## 2023-12-16 ENCOUNTER — Encounter (HOSPITAL_COMMUNITY): Payer: Self-pay

## 2023-12-16 ENCOUNTER — Other Ambulatory Visit (HOSPITAL_COMMUNITY): Payer: Self-pay

## 2023-12-16 ENCOUNTER — Other Ambulatory Visit: Payer: Self-pay

## 2023-12-17 ENCOUNTER — Other Ambulatory Visit: Payer: Self-pay

## 2023-12-17 ENCOUNTER — Other Ambulatory Visit (HOSPITAL_COMMUNITY): Payer: Self-pay

## 2023-12-20 ENCOUNTER — Ambulatory Visit (HOSPITAL_COMMUNITY)
Admission: RE | Admit: 2023-12-20 | Discharge: 2023-12-20 | Disposition: A | Discharge status: Home / Self Care | Payer: Medicaid Other | Source: Ambulatory Visit | Attending: Internal Medicine | Admitting: Internal Medicine

## 2023-12-20 ENCOUNTER — Other Ambulatory Visit: Payer: Self-pay

## 2023-12-20 ENCOUNTER — Other Ambulatory Visit (HOSPITAL_COMMUNITY): Payer: Self-pay

## 2023-12-20 ENCOUNTER — Other Ambulatory Visit (HOSPITAL_COMMUNITY): Payer: Self-pay | Admitting: Internal Medicine

## 2023-12-20 DIAGNOSIS — I5022 Chronic systolic (congestive) heart failure: Secondary | ICD-10-CM

## 2023-12-20 MED ORDER — GADOBUTROL 1 MMOL/ML IV SOLN
10.0000 mL | Freq: Once | INTRAVENOUS | Status: AC | PRN
  Administered 2023-12-20: 10 mL via INTRAVENOUS

## 2023-12-20 NOTE — Telephone Encounter (Signed)
 Copay is now 4.00 should be ready by 1pm - Trulicity 0.75 per our pharmacy

## 2023-12-20 NOTE — Progress Notes (Signed)
   12/20/2023 Name: Claudia [name] MRN: [id number] DOB: [birth year]   Claudia [name] is a 58 y.o. year old female who presented for a telephone visit.   They were referred to the pharmacist by their PCP for assistance in managing diabetes.   Outreached patient for visit scheduled this morning and she requested to reschedule as she had her cardiac MRI this morning and was on the way home, planning to rest. She confirmed she has picked up Nexlizet and started taking it with no issues. Also confirmed she plans to pick up Trulicity and stop Ozempic as recommended by cardiology. Medicaid requires patient to try and fail 2 preferred agents prior to coverage of Mounjaro. Patient would benefit from use of Mounjaro for additional weight loss benefit given recent weight gain. PCP visit scheduled for 01/19/24 and recommend to check A1c, UACR, and lipids at that visit. PharmD visit rescheduled for 02/01/24. Encouraged patient to reach out if she has any issues with medications in the mean time.  Jarrett Ables, PharmD PGY-1 Pharmacy Resident

## 2023-12-22 ENCOUNTER — Telehealth (HOSPITAL_COMMUNITY): Payer: Self-pay

## 2023-12-22 NOTE — Telephone Encounter (Addendum)
 Pt aware, agreeable, and verbalized understanding   ----- Message from Arvilla Meres sent at 12/21/2023 12:00 PM EDT ----- No amyloid

## 2023-12-23 ENCOUNTER — Other Ambulatory Visit: Payer: Self-pay | Admitting: Nurse Practitioner

## 2023-12-23 DIAGNOSIS — D649 Anemia, unspecified: Secondary | ICD-10-CM

## 2023-12-30 ENCOUNTER — Other Ambulatory Visit (HOSPITAL_COMMUNITY): Payer: Self-pay | Admitting: Adult Health

## 2023-12-31 ENCOUNTER — Other Ambulatory Visit (HOSPITAL_COMMUNITY): Payer: Self-pay | Admitting: Internal Medicine

## 2024-01-19 ENCOUNTER — Ambulatory Visit: Payer: Self-pay | Admitting: Nurse Practitioner

## 2024-01-21 ENCOUNTER — Other Ambulatory Visit: Payer: Self-pay | Admitting: Family Medicine

## 2024-01-21 DIAGNOSIS — E1169 Type 2 diabetes mellitus with other specified complication: Secondary | ICD-10-CM

## 2024-01-24 ENCOUNTER — Other Ambulatory Visit: Payer: Self-pay

## 2024-01-24 MED ORDER — TRULICITY 0.75 MG/0.5ML ~~LOC~~ SOAJ
0.7500 mg | SUBCUTANEOUS | 0 refills | Status: DC
  Filled 2024-01-24: qty 2, 28d supply, fill #0

## 2024-01-26 ENCOUNTER — Other Ambulatory Visit (HOSPITAL_COMMUNITY): Payer: Self-pay | Admitting: Internal Medicine

## 2024-01-27 ENCOUNTER — Other Ambulatory Visit: Payer: Self-pay

## 2024-02-01 ENCOUNTER — Other Ambulatory Visit: Payer: Self-pay

## 2024-02-01 NOTE — Progress Notes (Signed)
   02/01/2024 Name: Claudia [name] MRN: [id number] DOB: [birth year]  Outreached patient for scheduled appointment for medication management and she requested to reschedule as she has not been feeling well. Endorsed sinus congestion. Patient reported since switching from Ozempic  2 mg to Trulicity  0.75 mg as instructed by cardiology her blood sugars have increased with some recent readings going up to 200. Recommended patient reschedule visit for next week (next available pharmacist appointment) on Tuesday, 02/08/24 to discuss further, but patient declined. Noted she wants to give herself more time on current Trulicity  dose and see PCP next then follow up with the pharmacist. Next PCP appointment is scheduled for 02/23/24 and patient is due for A1C, lipid panel, and UACR. PharmD visit rescheduled for 03/07/24.  Georga Killings, PharmD PGY-1 Pharmacy Resident

## 2024-02-18 ENCOUNTER — Other Ambulatory Visit: Payer: Self-pay | Admitting: Family Medicine

## 2024-02-18 DIAGNOSIS — E1169 Type 2 diabetes mellitus with other specified complication: Secondary | ICD-10-CM

## 2024-02-22 ENCOUNTER — Other Ambulatory Visit: Payer: Self-pay

## 2024-02-22 MED ORDER — TRULICITY 0.75 MG/0.5ML ~~LOC~~ SOAJ
0.7500 mg | SUBCUTANEOUS | 0 refills | Status: DC
Start: 2024-02-22 — End: 2024-03-07
  Filled 2024-02-22: qty 2, 28d supply, fill #0

## 2024-02-23 ENCOUNTER — Ambulatory Visit: Payer: Self-pay | Admitting: Nurse Practitioner

## 2024-02-24 ENCOUNTER — Other Ambulatory Visit: Payer: Self-pay

## 2024-02-29 NOTE — Telephone Encounter (Signed)
 Copied from CRM [id number]. Topic: General - Other >> Feb 29, 2024  9:07 AM Hamp Levine R wrote: Reason for CRM: Patient called in stating she has an upcoming appointment this month, but would like to know what she needs to do to have her dog classified to an emotional support animal. States she is going through a lot and her dog calms her and she needs him with her.  Patient can be reached at [phone]

## 2024-03-07 ENCOUNTER — Other Ambulatory Visit: Payer: Self-pay

## 2024-03-07 DIAGNOSIS — E1169 Type 2 diabetes mellitus with other specified complication: Secondary | ICD-10-CM

## 2024-03-07 NOTE — Progress Notes (Signed)
 03/07/2024 Name: Claudia [name] MRN: [id number] DOB: 1965/10/01  Chief Complaint  Patient presents with   Diabetes   Hyperlipidemia    [name] is a 58 y.o. year old female who presented for a telephone visit.   They were referred to the pharmacist by their PCP for assistance in managing diabetes.   Care Team: Primary Care Provider: Jerrlyn Morel, NP ; Next Scheduled Visit: 03/17/24 Cardiologist: Dr. Bensimhon; Next Scheduled Visit: not yet scheduled  Medication Access/Adherence  Current Pharmacy:  [facility] [address] [place] - [address] [place] [address] Phone: [phone] Fax: [phone]  [facility] [address] [place] [address] Phone: [phone] Fax: [phone]  [facility] [address] [place] [address] Phone: [phone] Fax: [phone]   Patient reports affordability concerns with their medications: No  Patient reports access/transportation concerns to their pharmacy: No  Patient reports adherence concerns with their medications:  No    Subjective: Patient reports she has not been doing well. She was evicted from her home on 02/26/24 due to being late with rent payments. She was also waiting on her disability application which has been approved now. Reports difficulty with utility payments as her oxygen  requirement makes the energy bill higher. She is currently staying with her mother and looking for other housing, but is having trouble and needs help with resources.  Diabetes:  Current medications: Trulicity  0.75 mg weekly, Farxiga  10 mg daily Medications tried in the past: Ozempic  2 mg (switched to Trulicity  d/t need for greater weight loss - insurance requires trial of 2 preferred GLP-1s prior to coverage of Mounjaro )  Using glucometer; testing daily Random BG: 98,  129, 118, 89, 149, 150, denies BG>200  Patient denies hypoglycemic s/sx including dizziness, shakiness, sweating. Patient denies hyperglycemic symptoms including polyuria, polydipsia, polyphagia, nocturia, neuropathy, blurred vision.  Current medication access support: Medicaid  Hyperlipidemia/ASCVD Risk Reduction  Current lipid lowering medications: Nexlizet  10-180 mg daily Medications tried in the past: atorvastatin  10 mg, rosuvastatin  5-10 mg, pravastatin - myalgias simvastatin - hives    Objective:  Lab Results  Component Value Date   HGBA1C 6.5 (A) 10/20/2023    Lab Results  Component Value Date   CREATININE 0.78 12/13/2023   BUN 14 12/13/2023   NA 138 12/13/2023   K 3.8 12/13/2023   CL 99 12/13/2023   CO2 28 12/13/2023    Lab Results  Component Value Date   CHOL 229 (H) 10/20/2023   HDL 50 10/20/2023   LDLCALC 165 (H) 10/20/2023   TRIG 79 10/20/2023   CHOLHDL 4.6 (H) 10/20/2023    Medications Reviewed Today     Reviewed by Philmore Bream, RPH (Pharmacist) on 03/07/24 at 1751  Med List Status: <None>   Medication Order Taking? Sig Documenting Provider Last Dose Status Informant  acetaminophen  (TYLENOL ) 500 MG tablet [id number]  Take 500 mg by mouth every 6 (six) hours as needed for mild pain or moderate pain. [provider]  Active Self  amLODipine  (NORVASC ) 5 MG tablet [id number] Yes Take 1 tablet (5 mg total) by mouth daily. Vernia Good Home, [place] Taking Active   aspirin  81 MG EC tablet [id number] Yes Take 1 tablet (81 mg total) by mouth daily. Stroud, Natalie M, FNP Taking Active Self  Bempedoic Acid-Ezetimibe (NEXLIZET ) 180-10 MG TABS [id number] Yes Take 1 tablet by mouth daily. Nichols, Tonya  S, NP Taking Active   blood glucose meter kit and supplies KIT [id number]  Dispense based on patient and insurance preference. Use up to four times daily as directed. (FOR ICD-9 250.00, 250.01). Stroud, Natalie M, FNP  Active Self  Blood Glucose Monitoring Suppl  (ACCU-CHEK GUIDE) w/Device KIT [id number]  1 each by Does not apply route in the morning, at noon, in the evening, and at bedtime. Jerrlyn Morel, NP  Active Self  carvedilol  (COREG ) 3.125 MG tablet [id number] Yes TAKE 1 TABLET BY MOUTH TWICE DAILY WITH MEALS Bensimhon, Rheta Celestine, MD Taking Active   dapagliflozin  propanediol (FARXIGA ) 10 MG TABS tablet [id number] Yes Take 1 tablet (10 mg total) by mouth daily. [place], Arlice Bene, FNP Taking Active   Dulaglutide  (TRULICITY ) 0.75 MG/0.5ML [place] [id number] Yes Inject 0.75 mg into the skin once a week. [place], Arlice Bene, FNP Taking Active   Ferrous Sulfate  (IRON) 325 (65 Fe) MG TABS [id number] Yes Take 1 tablet by mouth once daily with breakfast Nichols, Tonya S, NP Taking Active   folic acid  (FOLVITE ) 800 MCG tablet [id number] Yes Take 0.5 tablets (400 mcg total) by mouth daily. Jerrlyn Morel, NP Taking Active   furosemide  (LASIX ) 80 MG tablet [id number] Yes Take 1 tablet (80 mg total) by mouth 2 (two) times daily. [place], Arlice Bene, FNP Taking Active   glucose blood (ACCU-CHEK GUIDE) test strip [id number]  Use as instructed to check sugar 4 times daily. Jerrlyn Morel, NP  Active Self  Lancets (ACCU-CHEK MULTICLIX) lancets [id number]  Use as instructed to check blood sugar 4 times daily. Jerrlyn Morel, NP  Active Self  ofloxacin  (FLOXIN ) 0.3 % OTIC solution [id number]  Place 5 drops into the right ear daily.  Patient taking differently: Place 5 drops into the right ear daily as needed.   Jerrlyn Morel, NP  Active   potassium chloride  SA (KLOR-CON  M) 20 MEQ tablet [id number] Yes Take 1 tablet (20 mEq total) by mouth daily. Bensimhon, Rheta Celestine, MD Taking Active   sacubitril -valsartan  (ENTRESTO ) 97-103 MG [id number] Yes Take 1 tablet by mouth twice daily Bensimhon, Rheta Celestine, MD Taking Active   spironolactone  (ALDACTONE ) 25 MG tablet [id number] Yes Take 1 tablet by mouth once daily Bensimhon, Rheta Celestine, MD Taking Active                Assessment/Plan:   Diabetes: - Currently controlled based on last A1C 6.5% on 10/20/23. Patient reported random BG readings appear controlled. She is tolerating Trulicity  well so recommend to increase the dose.  - Reviewed long term cardiovascular and renal outcomes of uncontrolled blood sugar - Reviewed goal A1c, goal fasting, and goal 2 hour post prandial glucose - Recommend to increase Trulicity  to 1.5 mg weekly - Recommend to continue Farxiga  10 mg daily  - Patient denies personal or family history of multiple endocrine neoplasia type 2, medullary thyroid  cancer; personal history of pancreatitis or gallbladder disease. - Recommend to check fasting blood glucose daily - A1C and UACR due at upcoming PCP visit   Hyperlipidemia/ASCVD Risk Reduction: - Currently uncontrolled based on last LDL 165 mg/dL, above goal <35 mg/dL given DM with risk factors. History of intolerance to statins and she has been started on Nexlizet .  - Reviewed long term complications of uncontrolled cholesterol - Reviewed dietary recommendations including limiting intake of processed foods - Recommend to continue Nexlizet . Repeat lipid panel due at upcoming PCP visit.   SDOH Needs  - Will place referral for SDOH needs to  [facility]I   Follow Up Plan: PCP on 03/17/24 and PharmD on 04/24/24  Georga Killings, PharmD PGY-1 Pharmacy Resident

## 2024-03-12 MED ORDER — TRULICITY 1.5 MG/0.5ML ~~LOC~~ SOAJ: 1.5000 mg | SUBCUTANEOUS | 5 refills | Status: DC

## 2024-03-14 ENCOUNTER — Other Ambulatory Visit: Payer: Self-pay

## 2024-03-17 ENCOUNTER — Ambulatory Visit (INDEPENDENT_AMBULATORY_CARE_PROVIDER_SITE_OTHER): Payer: Self-pay | Admitting: Nurse Practitioner

## 2024-03-17 ENCOUNTER — Encounter: Payer: Self-pay | Admitting: Nurse Practitioner

## 2024-03-17 ENCOUNTER — Other Ambulatory Visit: Payer: Self-pay

## 2024-03-17 VITALS — BP 138/77 | HR 79 | Temp 96.6°F | Resp 16 | Ht 68.0 in | Wt 297.0 lb

## 2024-03-17 DIAGNOSIS — E785 Hyperlipidemia, unspecified: Secondary | ICD-10-CM | POA: Diagnosis not present

## 2024-03-17 DIAGNOSIS — I10 Essential (primary) hypertension: Secondary | ICD-10-CM | POA: Diagnosis not present

## 2024-03-17 DIAGNOSIS — Z139 Encounter for screening, unspecified: Secondary | ICD-10-CM

## 2024-03-17 DIAGNOSIS — D649 Anemia, unspecified: Secondary | ICD-10-CM | POA: Diagnosis not present

## 2024-03-17 DIAGNOSIS — E1169 Type 2 diabetes mellitus with other specified complication: Secondary | ICD-10-CM

## 2024-03-17 DIAGNOSIS — F419 Anxiety disorder, unspecified: Secondary | ICD-10-CM

## 2024-03-17 DIAGNOSIS — F32A Depression, unspecified: Secondary | ICD-10-CM

## 2024-03-17 LAB — POCT GLYCOSYLATED HEMOGLOBIN (HGB A1C): Hemoglobin A1C: 7.5 % — AB (ref 4.0–5.6)

## 2024-03-17 MED ORDER — IRON 325 (65 FE) MG PO TABS
1.0000 | ORAL_TABLET | Freq: Every day | ORAL | 0 refills | Status: DC
Start: 2024-03-17 — End: 2024-05-12

## 2024-03-17 NOTE — Addendum Note (Signed)
 Addended by: Jerrlyn Morel on: 03/17/2024 09:22 AM   Modules accepted: Orders

## 2024-03-17 NOTE — Progress Notes (Addendum)
 Subjective   Patient ID: Claudia [name], female    DOB: November 12, 1965, 58 y.o.   MRN: [id number]  Chief Complaint  Patient presents with   Follow-up    Emotional support papers for her dog, paper for her handicap tags    Referring provider: Jerrlyn Morel, NP  [name] is a 58 y.o. female with Past Medical History: No date: Diabetes mellitus No date: Fibroids No date: Headache(784.0) No date: History of echocardiogram     Comment:  Echo 7/18: Moderate concentric LVH, EF 55-60, normal               wall motion, grade 1 diastolic dysfunction, calcified               aortic leaflets No date: Hypercholesterolemia No date: Hyperlipidemia No date: Hypertension No date: Osteoarthritis     Comment:  , with mild meniscus tear, followed by orthopedics--Dr               Dante Dyer 04/29/10 No date: Right knee pain No date: Seizures (HCC)     Comment:  as child No date: STEMI (ST elevation myocardial infarction) (HCC) No date: Vitamin D  deficiency   HPI  CHF: Patient is on Lasix  80 mg BID and has follow up with cardiology. Patient continues on 3L O2 daily and 4L at night with CPAP. Patient does have OSA.  Has been participating in pulmonary rehab.   Diabetes:  A1C in office today is 7.5. Taking farxiga  10 mg daily and Trulicity . CBGs were mostly within goal. Patient no longer taking glipizide . Following with pharmacy.    Aortic atherosclerosis noted on CT On aspirin  81 mg daily. Has prior allergies to pravastatin, rosuvastatin , and atorvastatin . Lipid panel here WNL w/ exception of LDL 140. Consulted with cardiology. Continues on fish oil  currently.    IDA Stable. On Ferrous sulfate  325 mg daily at home.   Patient complains today of anxiety and depression related to current financial and housing situation.  She did have to recently move and is in between housing at this time.  She does need a letter to be able to keep her dog for emotional support animal today.  We will create  letter today.  Patient also needs handicap placard due to the use of oxygen .   Denies f/c/s, n/v/d, hemoptysis, PND, leg swelling Denies chest pain or edema    Allergies  Allergen Reactions   Beef-Derived Drug Products     Patient does not eat beef   Crestor  [Rosuvastatin ]     MUSCLE ACHES HEADACHES    Diltiazem  Hives   Hydrocodone Itching   Other Hives, Swelling and Other (See Comments)    Vision changes; ALLERGIC TO ALL NUTS EXCEPT PEANUTS   Oxycodone  Itching   Pork-Derived Products     For dietary, patient does not eat pork   Pravachol [Pravastatin Sodium] Itching    Joint pain   Simvastatin Hives    Immunization History  Administered Date(s) Administered   Pneumococcal Polysaccharide-23 03/01/2017   Tdap 03/01/2017    Tobacco History: Social History   Tobacco Use  Smoking Status Never  Smokeless Tobacco Never   Counseling given: Not Answered   Outpatient Encounter Medications as of 03/17/2024  Medication Sig   acetaminophen  (TYLENOL ) 500 MG tablet Take 500 mg by mouth every 6 (six) hours as needed for mild pain or moderate pain.   amLODipine  (NORVASC ) 5 MG tablet Take 1 tablet (5 mg total) by mouth daily.  aspirin  81 MG EC tablet Take 1 tablet (81 mg total) by mouth daily.   Bempedoic Acid-Ezetimibe (NEXLIZET ) 180-10 MG TABS Take 1 tablet by mouth daily.   blood glucose meter kit and supplies KIT Dispense based on patient and insurance preference. Use up to four times daily as directed. (FOR ICD-9 250.00, 250.01).   Blood Glucose Monitoring Suppl (ACCU-CHEK GUIDE) w/Device KIT 1 each by Does not apply route in the morning, at noon, in the evening, and at bedtime.   carvedilol  (COREG ) 3.125 MG tablet TAKE 1 TABLET BY MOUTH TWICE DAILY WITH MEALS   dapagliflozin  propanediol (FARXIGA ) 10 MG TABS tablet Take 1 tablet (10 mg total) by mouth daily.   Dulaglutide  (TRULICITY ) 1.5 MG/0.5ML SOAJ Inject 1.5 mg into the skin once a week.   folic acid  (FOLVITE ) 800 MCG  tablet Take 0.5 tablets (400 mcg total) by mouth daily.   furosemide  (LASIX ) 80 MG tablet Take 1 tablet (80 mg total) by mouth 2 (two) times daily.   glucose blood (ACCU-CHEK GUIDE) test strip Use as instructed to check sugar 4 times daily.   Lancets (ACCU-CHEK MULTICLIX) lancets Use as instructed to check blood sugar 4 times daily.   ofloxacin  (FLOXIN ) 0.3 % OTIC solution Place 5 drops into the right ear daily. (Patient taking differently: Place 5 drops into the right ear daily as needed.)   potassium chloride  SA (KLOR-CON  M) 20 MEQ tablet Take 1 tablet (20 mEq total) by mouth daily.   sacubitril -valsartan  (ENTRESTO ) 97-103 MG Take 1 tablet by mouth twice daily   spironolactone  (ALDACTONE ) 25 MG tablet Take 1 tablet by mouth once daily   [DISCONTINUED] Ferrous Sulfate  (IRON) 325 (65 Fe) MG TABS Take 1 tablet by mouth once daily with breakfast   Ferrous Sulfate  (IRON) 325 (65 Fe) MG TABS Take 1 tablet (325 mg total) by mouth daily with breakfast.   No facility-administered encounter medications on file as of 03/17/2024.    Review of Systems  Review of Systems  Constitutional: Negative.   HENT: Negative.    Cardiovascular: Negative.   Gastrointestinal: Negative.   Allergic/Immunologic: Negative.   Neurological: Negative.   Psychiatric/Behavioral: Negative.       Objective:   BP 138/77   Pulse 79   Temp (!) 96.6 F (35.9 C) (Temporal)   Resp 16   Ht 5' 8 (1.727 m)   Wt 297 lb (134.7 kg)   LMP 05/09/2012   SpO2 100%   BMI 45.16 kg/m   Wt Readings from Last 5 Encounters:  03/17/24 297 lb (134.7 kg)  12/13/23 280 lb (127 kg)  10/20/23 272 lb 9.6 oz (123.7 kg)  09/16/23 266 lb (120.7 kg)  06/03/23 252 lb 6.4 oz (114.5 kg)     Physical Exam Vitals and nursing note reviewed.  Constitutional:      General: She is not in acute distress.    Appearance: She is well-developed.   Cardiovascular:     Rate and Rhythm: Normal rate and regular rhythm.  Pulmonary:     Effort:  Pulmonary effort is normal.     Breath sounds: Normal breath sounds.   Neurological:     Mental Status: She is alert and oriented to person, place, and time.       Assessment & Plan:   Anemia, unspecified type -     CBC -     Comprehensive metabolic panel with GFR -     Iron, TIBC and Ferritin Panel -     TSH -  Lipid panel -     Iron; Take 1 tablet (325 mg total) by mouth daily with breakfast.  Dispense: 30 tablet; Refill: 0  Type 2 diabetes mellitus with hyperlipidemia (HCC) -     CBC -     Comprehensive metabolic panel with GFR -     TSH -     Lipid panel -     POCT glycosylated hemoglobin (Hb A1C)  Essential hypertension -     CBC -     Comprehensive metabolic panel with GFR -     TSH -     Lipid panel  Anxiety and depression -     AMB Referral [facility] Care Management  Encounter for screening involving social determinants of health (SDoH) -     AMB Referral VBCI Care Management     Return in about 3 months (around 06/17/2024).    Jerrlyn Morel, NP 03/17/2024

## 2024-03-17 NOTE — Patient Instructions (Signed)
 1. Anemia, unspecified type (Primary)  - CBC - Comprehensive metabolic panel with GFR - Iron, TIBC and Ferritin Panel - TSH - Lipid Panel - Ferrous Sulfate  (IRON) 325 (65 Fe) MG TABS; Take 1 tablet (325 mg total) by mouth daily with breakfast.  Dispense: 30 tablet; Refill: 0  2. Type 2 diabetes mellitus with hyperlipidemia (HCC)  - CBC - Comprehensive metabolic panel with GFR - TSH - Lipid Panel - HgB A1c  3. Essential hypertension  - CBC - Comprehensive metabolic panel with GFR - TSH - Lipid Panel

## 2024-03-18 LAB — COMPREHENSIVE METABOLIC PANEL WITH GFR
ALT: 21 IU/L (ref 0–32)
AST: 28 IU/L (ref 0–40)
Albumin: 4.6 g/dL (ref 3.8–4.9)
Alkaline Phosphatase: 93 IU/L (ref 44–121)
BUN/Creatinine Ratio: 27 — ABNORMAL HIGH (ref 9–23)
BUN: 22 mg/dL (ref 6–24)
Bilirubin Total: 0.4 mg/dL (ref 0.0–1.2)
CO2: 28 mmol/L (ref 20–29)
Calcium: 9.6 mg/dL (ref 8.7–10.2)
Chloride: 99 mmol/L (ref 96–106)
Creatinine, Ser: 0.81 mg/dL (ref 0.57–1.00)
Globulin, Total: 3.2 g/dL (ref 1.5–4.5)
Glucose: 159 mg/dL — ABNORMAL HIGH (ref 70–99)
Potassium: 4.4 mmol/L (ref 3.5–5.2)
Sodium: 142 mmol/L (ref 134–144)
Total Protein: 7.8 g/dL (ref 6.0–8.5)
eGFR: 85 mL/min/{1.73_m2} (ref >59)

## 2024-03-18 LAB — CBC
Hematocrit: 37.6 % (ref 34.0–46.6)
Hemoglobin: 10.8 g/dL — ABNORMAL LOW (ref 11.1–15.9)
MCH: 22.2 pg — ABNORMAL LOW (ref 26.6–33.0)
MCHC: 28.7 g/dL — ABNORMAL LOW (ref 31.5–35.7)
MCV: 77 fL — ABNORMAL LOW (ref 79–97)
Platelets: 420 10*3/uL (ref 150–450)
RBC: 4.87 x10E6/uL (ref 3.77–5.28)
RDW: 14.4 % (ref 11.7–15.4)
WBC: 7.1 10*3/uL (ref 3.4–10.8)

## 2024-03-18 LAB — LIPID PANEL
Chol/HDL Ratio: 3 ratio (ref 0.0–4.4)
Cholesterol, Total: 164 mg/dL (ref 100–199)
HDL: 55 mg/dL (ref >39)
LDL Chol Calc (NIH): 97 mg/dL (ref 0–99)
Triglycerides: 62 mg/dL (ref 0–149)
VLDL Cholesterol Cal: 12 mg/dL (ref 5–40)

## 2024-03-18 LAB — IRON,TIBC AND FERRITIN PANEL
Ferritin: 373 ng/mL — ABNORMAL HIGH (ref 15–150)
Iron Saturation: 18 % (ref 15–55)
Iron: 78 ug/dL (ref 27–159)
Total Iron Binding Capacity: 423 ug/dL (ref 250–450)
UIBC: 345 ug/dL (ref 131–425)

## 2024-03-18 LAB — TSH: TSH: 0.774 u[IU]/mL (ref 0.450–4.500)

## 2024-03-19 ENCOUNTER — Ambulatory Visit: Payer: Self-pay | Admitting: Nurse Practitioner

## 2024-03-20 ENCOUNTER — Telehealth: Payer: Self-pay | Admitting: *Deleted

## 2024-03-20 NOTE — Progress Notes (Unsigned)
 Complex Care Management Note Care Guide Note  03/20/2024 Name: Claudia [name] MRN: [id number] DOB: 1966-02-03   Complex Care Management Outreach Attempts: An unsuccessful telephone outreach was attempted today to offer the patient information about available complex care management services.  Follow Up Plan:  Additional outreach attempts will be made to offer the patient complex care management information and services.   Encounter Outcome:  No Answer  Harlene Satterfield  [facility]  [facility], [facility] Guide  Direct Dial: [phone]  Fax [phone]

## 2024-03-22 NOTE — Progress Notes (Signed)
 Complex Care Management Note  Care Guide Note 03/22/2024 Name: Claudia [name] MRN: [id number] DOB: Nov 09, 1965  Claudia [name] is a 58 y.o. year old female who sees Oley Bascom RAMAN, NP for primary care. I reached out to Chesley LITTIE Moats by phone today to offer complex care management services.  Ms. Inclan was given information about Complex Care Management services today including:   The Complex Care Management services include support from the care team which includes your Nurse Care Manager, Clinical Social Worker, or Pharmacist.  The Complex Care Management team is here to help remove barriers to the health concerns and goals most important to you. Complex Care Management services are voluntary, and the patient may decline or stop services at any time by request to their care team member.   Complex Care Management Consent Status: Patient agreed to services and verbal consent obtained.   Follow up plan:  Telephone appointment with complex care management team member scheduled for:  6/26 with BSW and 7/7 with LCSW   Encounter Outcome:  Patient Scheduled  Harlene Satterfield  [facility]  [facility], [facility] Guide  Direct Dial: [phone]  Fax [phone]

## 2024-03-22 NOTE — Progress Notes (Signed)
 Complex Care Management Note Care Guide Note  03/22/2024 Name: Claudia [name] MRN: [id number] DOB: Sep 05, 1966   Complex Care Management Outreach Attempts: A second unsuccessful outreach was attempted today to offer the patient with information about available complex care management services.  Follow Up Plan:  Additional outreach attempts will be made to offer the patient complex care management information and services.   Encounter Outcome:  No Answer  Harlene Satterfield  [facility]  [facility], [facility] Guide  Direct Dial: [phone]  Fax [phone]

## 2024-03-23 ENCOUNTER — Other Ambulatory Visit: Payer: Self-pay

## 2024-03-23 NOTE — Patient Outreach (Signed)
 Complex Care Management   Visit Note  03/23/2024  Name:  PAUL TORPEY MRN: [id number] DOB: Feb 03, 1966  Situation: Referral received for Complex Care Management related to SDOH Barriers:  Housing   Food insecurity Financial Resource Strain I obtained verbal consent from Patient.  Visit completed with patient  on the phone  Background:   Past Medical History:  Diagnosis Date   Diabetes mellitus    Fibroids    Headache(784.0)    History of echocardiogram    Echo 7/18: Moderate concentric LVH, EF 55-60, normal wall motion, grade 1 diastolic dysfunction, calcified aortic leaflets   Hypercholesterolemia    Hyperlipidemia    Hypertension    Osteoarthritis    , with mild meniscus tear, followed by orthopedics--Dr Heide 04/29/10   Right knee pain    Seizures (HCC)    as child   STEMI (ST elevation myocardial infarction) (HCC)    Vitamin D  deficiency     Assessment: BSW outreached patient to assist with SDOH barriers. Social worker identified challenges with food insecurity, housing, and financial strain. Social worker provided resources to patient and had advised patient to reach back out if they had any further questions.   SDOH Interventions Today    Flowsheet Row Most Recent Value  SDOH Interventions   Food Insecurity Interventions Community Resources Provided  Housing Interventions Community Resources Provided  Financial Strain Interventions Community Resources Provided    Recommendation:   No recommendations at this time.  Follow Up Plan:   Patient has met all care management goals. Care Management case will be closed. Patient has been provided contact information should new needs arise.   Orlean Fey, BSW [place]  [facility] Social Worker, [facility] [phone]

## 2024-03-23 NOTE — Patient Instructions (Signed)
 Visit Information  Thank you for taking time to visit with me today. Please don't hesitate to contact me if I can be of assistance to you before our next scheduled appointment.  Our next appointment is by telephone no further scheduled appointments.   Please call the care guide team at [phone] if you need to cancel or reschedule your appointment.     Please call the Suicide and Crisis Lifeline: 988 call the [place]  National Suicide Prevention Lifeline: 918-191-0291 or TTY: 940 305 8343 TTY 727-421-5156) to talk to a trained counselor call 1-800-273-TALK (toll free, 24 hour hotline) go to [facility] [address] [place] [phone]) call 911 if you are experiencing a Mental Health or Behavioral Health Crisis or need someone to talk to.  Patient verbalizes understanding of instructions and care plan provided today and agrees to view in MyChart. Active MyChart status and patient understanding of how to access instructions and care plan via MyChart confirmed with patient.     Orlean Fey, BSW [place]  [facility] Social Worker, [facility] [phone]

## 2024-04-03 ENCOUNTER — Other Ambulatory Visit: Payer: Self-pay | Admitting: Licensed Clinical Social Worker

## 2024-04-03 ENCOUNTER — Other Ambulatory Visit: Payer: Self-pay

## 2024-04-03 NOTE — Patient Instructions (Signed)
 Visit Information  Thank you for taking time to visit with me today. Please don't hesitate to contact me if I can be of assistance to you before our next scheduled appointment.  Our next appointment is by telephone on 05/01/24 at 11:00 AM   Please call the care guide team at [phone] if you need to cancel or reschedule your appointment.   Following is a copy of your care plan:   Goals Addressed             This Visit's Progress    VBCI Social Work Care Plan       Problems:   Transport needs             Has pet dog for emotional support animal             Housing issues              Financial issues            Anxiety issues; Depression issues            Management of chronic illness issues  CSW Clinical Goal(s):   Over the next 30 days the Patient will attend all scheduled medical appointments as evidenced by patient report and care team review of appointment completion in electronic MEDICAL RECORD NUMBER .             Over next 30 days patient will use coping skills to help her manage anxiety and depression issues faced AEB patient report of reduced issues with anxiety and depression  Interventions:  Spoke with client about her managing ADLs and daily needs               Discussed transport needs of client              Discussed financial challenges.               Discussed medication procurement.  Discussed support from her spouse             Discussed emotional support pet dog              Discussed mood of client. She spoke of anxiety and depression issues faced. She said she liked to listen to music to relax. She likes to play games on her phone.             Discussed insurance of client. She has [place] Medicaid. She is inquiring about [facility] insurance with her Medicaid worker              Discussed mobility. She has difficulty walking. She does have a walker to use if needed.  Discussed fatigue issues. She gets short of breath occasionally              Discussed oxygen  use. She  said she uses oxygen  24/7 at 3 liters during the day and uses oxygen  at 4 liters at night.  She uses CPAP machine to help her sleep              Discussed family support from mother, from clients children              Provided counseling support              Discussed program support with RN, Pharmacist, LCSW               Encouraged client to call LCSW as needed for SW support at [phone]  Client was appreciative of call from LCSW today  Patient Goals/Self-Care Activities:  Attend scheduled medical appointments              Take medications as prescribed.               Contact local food pantries as needed               Communicate with NP Bascom GORMAN Borer as needed to discuss medical needs of client               Call LCSW as needed for SW support  Plan:   Telephone follow up appointment with care management team member scheduled for:  05/01/24 at 11:00 AM        Please go to [facility] [address], [place] [phone]) if you are experiencing a Mental Health or Behavioral Health Crisis or need someone to talk to.  The patient verbalized understanding of instructions, educational materials, and care plan provided today and DECLINED offer to receive copy of patient instructions, educational materials, and care plan.    Glendia Pear  MSW, LCSW [place] Licensed Clinical Social Worker Direct Dial:  [phone] Fax:  [phone] Website:  [email or web address]

## 2024-04-03 NOTE — Patient Outreach (Signed)
 Complex Care Management   Visit Note  04/03/2024  Name:  Claudia [name] MRN: [id number] DOB: [birth year]  Situation: Referral received for Complex Care Management related to anxiety issues and depression issues I obtained verbal consent from Patient.  Visit completed with patient  on the phone  Background:   Past Medical History:  Diagnosis Date   Diabetes mellitus    Fibroids    Headache(784.0)    History of echocardiogram    Echo 7/18: Moderate concentric LVH, EF 55-60, normal wall motion, grade 1 diastolic dysfunction, calcified aortic leaflets   Hypercholesterolemia    Hyperlipidemia    Hypertension    Osteoarthritis    , with mild meniscus tear, followed by orthopedics--Dr Heide 04/29/10   Right knee pain    Seizures (HCC)    as child   STEMI (ST elevation myocardial infarction) (HCC)    Vitamin D  deficiency     Assessment: Patient Reported Symptoms:  Cognitive Cognitive Status: Alert and oriented to person, place, and time   Health Maintenance Behaviors: Sleep adequate, Stress management Health Facilitated by: Rest, Stress management  Neurological Neurological Review of Symptoms: Weakness, Headaches, Dizziness Neurological Management Strategies: Adequate rest, Coping strategies  HEENT HEENT Symptoms Reported: No symptoms reported HEENT Management Strategies: Adequate rest, Coping strategies    Cardiovascular Cardiovascular Symptoms Reported: Lightheadness, Fatigue Cardiovascular Management Strategies: Adequate rest, Coping strategies  Respiratory Respiratory Symptoms Reported: Shortness of breath Other Respiratory Symptoms: shortness of breath sometimes when walking Additional Respiratory Details: uses Oxygen  as prescribed in the home ( 3 liters via nasal canula during day) Respiratory Management Strategies: Activity, Adequate rest  Endocrine Endocrine Symptoms Reported: Weakness or fatigue, Shortness of breath Is patient diabetic?: Yes    Gastrointestinal  Gastrointestinal Symptoms Reported: No symptoms reported Gastrointestinal Management Strategies: Adequate rest, Coping strategies    Genitourinary Genitourinary Symptoms Reported: Urgency, Frequency Additional Genitourinary Details: Takes Lasix  as prescribed; [place] Genitourinary Management Strategies: Adequate rest  Integumentary Integumentary Symptoms Reported: No symptoms reported Additional Integumentary Details: no skin breakdown Skin Management Strategies: Adequate rest, Coping strategies  Musculoskeletal Musculoskelatal Symptoms Reviewed: Limited mobility, Difficulty walking, Joint pain, Weakness Musculoskeletal Management Strategies: Coping strategies, Adequate rest      Psychosocial Psychosocial Symptoms Reported: Anxiety - if selected complete GAD, Depression - if selected complete PHQ 2-9 Additional Psychological Details: anxiety and depression Behavioral Management Strategies: Adequate rest, Community resources, Coping strategies Major Change/Loss/Stressor/Fears (CP): Medical condition, self Techniques to Cope with Loss/Stress/Change: Counseling, Diversional activities Quality of Family Relationships: supportive Do you feel physically threatened by others?: No      04/03/2024   10:23 AM  Depression screen PHQ 2/9  Decreased Interest 1  Down, Depressed, Hopeless 1  PHQ - 2 Score 2  Altered sleeping 1  Tired, decreased energy 1  Change in appetite 1  Feeling bad or failure about yourself  0  Trouble concentrating 1  Moving slowly or fidgety/restless 1  Suicidal thoughts 0  PHQ-9 Score 7  Difficult doing work/chores Somewhat difficult    Vitals:   BP in normal range per client Medications Reviewed Today     Reviewed by Frances Ozell GORMAN KEN (Social Worker) on 04/03/24 at 1012  Med List Status: <None>   Medication Order Taking? Sig Documenting Provider Last Dose Status Informant  acetaminophen  (TYLENOL ) 500 MG tablet [id number] Yes Take  500 mg by mouth every 6 (six) hours as needed for mild pain or moderate pain. [provider]  Active Self  amLODipine  (NORVASC )  5 MG tablet [id number] Yes Take 1 tablet (5 mg total) by mouth daily. Glena Raisin [place], [place]  Active   aspirin  81 MG EC tablet [id number] Yes Take 1 tablet (81 mg total) by mouth daily. Stroud, Natalie M, FNP  Active Self  Bempedoic Acid-Ezetimibe (NEXLIZET ) 180-10 MG TABS [id number] unknown Take 1 tablet by mouth daily. Oley Bascom RAMAN, NP  Active   blood glucose meter kit and supplies KIT [id number] Yes Dispense based on patient and insurance preference. Use up to four times daily as directed. (FOR ICD-9 250.00, 250.01). Stroud, Natalie M, FNP  Active Self  Blood Glucose Monitoring Suppl (ACCU-CHEK GUIDE) w/Device KIT [id number] Yes 1 each by Does not apply route in the morning, at noon, in the evening, and at bedtime. Oley Bascom RAMAN, NP  Active Self  carvedilol  (COREG ) 3.125 MG tablet [id number] Yes TAKE 1 TABLET BY MOUTH TWICE DAILY WITH MEALS Bensimhon, Toribio SAUNDERS, MD  Active   dapagliflozin  propanediol (FARXIGA ) 10 MG TABS tablet [id number] Yes Take 1 tablet (10 mg total) by mouth daily. [place], [place], [place]  Active   Dulaglutide  (TRULICITY ) 1.5 MG/0.5ML EMMANUEL [id number] Yes Inject 1.5 mg into the skin once a week. Oley Bascom RAMAN, NP  Active   Ferrous Sulfate  (IRON ) 325 (65 Fe) MG TABS [id number] Yes Take 1 tablet (325 mg total) by mouth daily with breakfast. Nichols, Tonya S, NP  Active   folic acid  (FOLVITE ) 800 MCG tablet [id number] unknown Take 0.5 tablets (400 mcg total) by mouth daily. Oley Bascom RAMAN, NP  Active   furosemide  (LASIX ) 80 MG tablet [id number] Yes Take 1 tablet (80 mg total) by mouth 2 (two) times daily. [place], [place], [place]  Active   glucose blood (ACCU-CHEK GUIDE) test strip [id number] Yes Use as instructed to check sugar 4 times daily. Oley Bascom RAMAN, NP  Active Self  Lancets (ACCU-CHEK MULTICLIX) lancets [id number] Yes Use as instructed to  check blood sugar 4 times daily. Oley Bascom RAMAN, NP  Active Self  ofloxacin  (FLOXIN ) 0.3 % OTIC solution [id number] unknown Place 5 drops into the right ear daily.  Patient not taking: Reported on 04/03/2024   Oley Bascom RAMAN, NP  Active   potassium chloride  SA (KLOR-CON  M) 20 MEQ tablet [id number] Yes Take 1 tablet (20 mEq total) by mouth daily. Bensimhon, Toribio SAUNDERS, MD  Active   sacubitril -valsartan  (ENTRESTO ) 97-103 MG [id number] Yes Take 1 tablet by mouth twice daily Bensimhon, Daniel R, MD  Active   spironolactone  (ALDACTONE ) 25 MG tablet [id number] Yes Take 1 tablet by mouth once daily Bensimhon, Toribio SAUNDERS, MD  Active             Recommendation:   PCP Follow-up Continue Current Plan of Care Call LCSW as needed for SW support Contact food pantries as needed for food support  Follow Up Plan:   Telephone follow up appointment date/time:  05/01/24 at 11:00 AM   Glendia Pear  MSW, LCSW [place] Licensed Clinical Social Worker Direct Dial:  [phone] Fax:  [phone] Website:  [email or web address]

## 2024-04-24 ENCOUNTER — Other Ambulatory Visit: Payer: Self-pay

## 2024-04-24 DIAGNOSIS — E1169 Type 2 diabetes mellitus with other specified complication: Secondary | ICD-10-CM

## 2024-04-24 MED ORDER — TRULICITY 3 MG/0.5ML ~~LOC~~ SOAJ: 3.0000 mg | SUBCUTANEOUS | 2 refills | Status: DC

## 2024-04-24 NOTE — Progress Notes (Signed)
 04/24/2024 Name: Claudia [name] MRN: [id number] DOB: 03/13/1966  Chief Complaint  Patient presents with   Diabetes    Claudia [name] is a 58 y.o. year old female who presented for a telephone visit.   They were referred to the pharmacist by their PCP for assistance in managing diabetes. PMH includes HTN, PSVT, CHF (EF 45% in March 2025) on 3L O3, T2DM, HLD, NSTEMI, BMI > 40.   Subjective: Patient was last seen by PCP on 03/17/24. A1C increased from 6.5% to 7.5%. She continues to have anxiety and depression worsened by current financial and housing situation. Patient LDL-C improved from 165 mg/dL to 97 mg/dL on Nexlizet .   Patient reports that she feels her increase in A1C was likely contributed to by the stressful events over the past couple months. She reports that she did increase Trulicity  to 1.5 mg weekly, but she is not sure it is really working well. She has not noticed any additional weight loss.   Care Team: Primary Care Provider: Oley Bascom RAMAN, NP ; Next Scheduled Visit: 06/19/24 Cardiologist: Dr. Bensimhon; Next Scheduled Visit: not yet scheduled  Medication Access/Adherence  Current Pharmacy:  [facility] [address], [place] - [address] [place] [address] Phone: [phone] Fax: [phone]  [facility] [address] [place] [address] Phone: [phone] Fax: [phone]  [facility] [address] [place] [address] Phone: [phone] Fax: [phone]   Patient reports affordability concerns with their medications: No  Patient reports access/transportation concerns to their pharmacy: No  Patient reports adherence concerns with their medications:  No    Insurance - [employer] Medicaid  Diabetes:  Current medications: Trulicity  1.5 mg weekly (Fridays - has 3 pens left), Farxiga  10 mg  daily Medications tried in the past: Ozempic  2 mg (switched to Trulicity  d/t need for greater weight loss - insurance requires trial of 2 preferred GLP-1s prior to coverage of Mounjaro )  Patient denies GI issues since increasing Trulicity . She continues to be motivated to lose weight.   Using glucometer; testing a couple times per week Random BG: last checked 1.5 weeks ago - random BG 123, AM FBG 97 mg/dL. Highest was 200 mg/dL at 9PM at night.  Patient denies hypoglycemic s/sx including dizziness, shakiness, sweating. Patient denies hyperglycemic symptoms including polyuria, polydipsia, polyphagia, nocturia, neuropathy, blurred vision.  Current medication access support: Medicaid  Hyperlipidemia/ASCVD Risk Reduction  Current lipid lowering medications: Nexlizet  10-180 mg daily Medications tried in the past: atorvastatin  10 mg, rosuvastatin  5-10 mg, pravastatin - myalgias simvastatin - hives   Heart Failure (EF 45% on cMRI March 2025):  Current medications:  ACEi/ARB/ARNI: Entresto  97-103 mg BID SGLT2i: Farxiga  10 mg daily Beta blocker: carvedilol  3.125 mg BID Mineralocorticoid Receptor Antagonist: spironolactone  25 mg daily Diuretic regimen: furosemide  80 mg BID Potassium 20 mEq daily  Current home blood pressure readings: not currently checking  Current home weights: last time she checked was 298 lbs.   Patient reports volume overload signs or symptoms including  lower extremity edema when she did not take fluid pills for three days when she was moving - reports that fluid is starting to come off now that she has resumed her Lasix .    Objective:  BP Readings from Last 3 Encounters:  03/17/24 138/77  12/13/23 128/84  09/16/23 (!) 146/75   Lab Results  Component Value Date   HGBA1C 7.5 (  A) 03/17/2024   HGBA1C 6.5 (A) 10/20/2023   HGBA1C 5.7 (A) 04/26/2023    Lab Results  Component Value Date   CREATININE 0.81 03/17/2024   BUN 22 03/17/2024   NA 142 03/17/2024   K  4.4 03/17/2024   CL 99 03/17/2024   CO2 28 03/17/2024    Lab Results  Component Value Date   CHOL 164 03/17/2024   HDL 55 03/17/2024   LDLCALC 97 03/17/2024   TRIG 62 03/17/2024   CHOLHDL 3.0 03/17/2024    Medications Reviewed Today     Reviewed by Brinda Lorain SQUIBB, RPH (Pharmacist) on 04/24/24 at 1448  Med List Status: <None>   Medication Order Taking? Sig Documenting Provider Last Dose Status Informant  acetaminophen  (TYLENOL ) 500 MG tablet [id number]  Take 500 mg by mouth every 6 (six) hours as needed for mild pain or moderate pain. [provider]  Active Self  amLODipine  (NORVASC ) 5 MG tablet [id number] Yes Take 1 tablet (5 mg total) by mouth daily. Glena Raisin [place], [place]  Active   aspirin  81 MG EC tablet [id number] Yes Take 1 tablet (81 mg total) by mouth daily. Stroud, Natalie M, FNP  Active Self  Bempedoic Acid-Ezetimibe (NEXLIZET ) 180-10 MG TABS [id number] Yes Take 1 tablet by mouth daily. Oley Bascom RAMAN, NP  Active   blood glucose meter kit and supplies KIT [id number]  Dispense based on patient and insurance preference. Use up to four times daily as directed. (FOR ICD-9 250.00, 250.01). Stroud, Natalie M, FNP  Active Self  Blood Glucose Monitoring Suppl (ACCU-CHEK GUIDE) w/Device KIT [id number]  1 each by Does not apply route in the morning, at noon, in the evening, and at bedtime. Oley Bascom RAMAN, NP  Active Self  carvedilol  (COREG ) 3.125 MG tablet [id number] Yes TAKE 1 TABLET BY MOUTH TWICE DAILY WITH MEALS Bensimhon, Toribio SAUNDERS, MD  Active   dapagliflozin  propanediol (FARXIGA ) 10 MG TABS tablet [id number] Yes Take 1 tablet (10 mg total) by mouth daily. Hope, Elysian, FNP  Active   Dulaglutide  (TRULICITY ) 1.5 MG/0.5ML EMMANUEL [id number] Yes Inject 1.5 mg into the skin once a week. Oley Bascom RAMAN, NP  Active   Dulaglutide  (TRULICITY ) 3 MG/0.5ML EMMANUEL [id number] Yes Inject 3 mg into the skin once a week. Oley Bascom RAMAN, NP  Active   Ferrous Sulfate  (IRON ) 325 (65 Fe) MG TABS  [id number]  Take 1 tablet (325 mg total) by mouth daily with breakfast. Nichols, Tonya S, NP  Active   folic acid  (FOLVITE ) 800 MCG tablet [id number]  Take 0.5 tablets (400 mcg total) by mouth daily. Oley Bascom RAMAN, NP  Active   furosemide  (LASIX ) 80 MG tablet [id number] Yes Take 1 tablet (80 mg total) by mouth 2 (two) times daily. [place], [place], [place]  Active   glucose blood (ACCU-CHEK GUIDE) test strip [id number]  Use as instructed to check sugar 4 times daily. Oley Bascom RAMAN, NP  Active Self  Lancets (ACCU-CHEK MULTICLIX) lancets [id number]  Use as instructed to check blood sugar 4 times daily. Oley Bascom RAMAN, NP  Active Self  ofloxacin  (FLOXIN ) 0.3 % OTIC solution [id number]  Place 5 drops into the right ear daily.  Patient not taking: Reported on 04/03/2024   Oley Bascom RAMAN, NP  Active   potassium chloride  SA (KLOR-CON  M) 20 MEQ tablet [id number] Yes Take 1 tablet (20 mEq total) by mouth daily. Bensimhon, Toribio SAUNDERS, MD  Active   sacubitril -valsartan  (ENTRESTO ) 97-103 MG [id number] Yes Take 1 tablet by mouth  twice daily Bensimhon, Daniel R, MD  Active   spironolactone  (ALDACTONE ) 25 MG tablet [id number] Yes Take 1 tablet by mouth once daily Bensimhon, Toribio SAUNDERS, MD  Active               Assessment/Plan:   Diabetes: - Currently uncontrolled based on last A1C 7.5% above goal < 7% and worsened from 6.5% on 10/20/23 in the setting of transitioning from Ozempic  to Trulicity  in attempt to pursue insurance coverage of Mounjaro . She is agreeable to increasing Trulicity  dose for additional glycemic control and weight loss.  - UACR due now: March 2024 < 5 mg/g - Reviewed long term cardiovascular and renal outcomes of uncontrolled blood sugar - Reviewed goal A1c, goal fasting, and goal 2 hour post prandial glucose - Recommend to increase Trulicity  to 3 mg weekly at next fill (in ~3 weeks) - Recommend to continue Farxiga  10 mg daily  - Patient denies personal or family history of multiple endocrine  neoplasia type 2, medullary thyroid  cancer; personal history of pancreatitis or gallbladder disease. - Recommend to check fasting blood glucose daily - Next A1C Sept 2025   Hyperlipidemia/ASCVD Risk Reduction: - Currently uncontrolled based on last LDL 97 mg/dL above goal < 70 mg/dL, but much improved from 165 mg/dL since starting Nexlizet  (~40% decrease). Patient would be a good candidate for PCSK9i combination therapy given hx of premature ASCVD. Will plan to discuss when patient's living and financial situation have stabilized.  - Reviewed long term complications of uncontrolled cholesterol - Reviewed dietary recommendations including limiting intake of processed foods - Recommend to continue Nexlizet  (bempedoic acid-ezetimibe) 180-10 mg daily.    Heart Failure (EF 45% in March 2025): - Currently appropriately managed. Patient follows with [facility].  - Reviewed appropriate blood pressure monitoring technique and reviewed goal blood pressure - Reviewed to weigh daily and when to contact cardiology with weight gain - Recommend to continue current regimen:   ACEi/ARB/ARNI: Entresto  97-103 mg BID SGLT2i: Farxiga  10 mg daily Beta blocker: carvedilol  3.125 mg BID Mineralocorticoid Receptor Antagonist: spironolactone  25 mg daily Diuretic regimen: furosemide  80 mg BID Potassium 20 mEq daily - Encouraged patient to call AHF clinic to schedule next follow-up appt (~Sept 2025) with Dr. Cherrie  Follow Up Plan: PharmD 06/07/24, PCP 06/19/24  Lorain Baseman, PharmD [facility] [phone]

## 2024-05-01 ENCOUNTER — Other Ambulatory Visit: Payer: Self-pay | Admitting: Licensed Clinical Social Worker

## 2024-05-01 ENCOUNTER — Other Ambulatory Visit: Payer: Self-pay

## 2024-05-01 NOTE — Patient Instructions (Signed)
 Visit Information  Thank you for taking time to visit with me today. Please don't hesitate to contact me if I can be of assistance to you before our next scheduled appointment.  Our next appointment is by telephone on 06/06/24 at 1:00 PM   Please call the care guide team at [phone] if you need to cancel or reschedule your appointment.   Following is a copy of your care plan:   Goals Addressed             This Visit's Progress    VBCI Social Work Care Plan       Problems:   Transport needs             Has pet dog for emotional support animal             Housing issues              Financial issues            Anxiety issues; Depression issues            Management of chronic illness issues  CSW Clinical Goal(s):   Over the next 30 days the Patient will attend all scheduled medical appointments as evidenced by patient report and care team review of appointment completion in electronic MEDICAL RECORD NUMBER .             Over next 30 days patient will use coping skills of choice to help her manage anxiety and depression issues faced AEB patient report of reduced issues with anxiety and depression  Interventions:  Spoke with client about her managing ADLs and daily needs              Discussed financial challenges.               Discussed medication procurement.  Discussed support from her spouse            Previously talked with [place] about her mood . She has said previously that she likes caring for her emotional support pet (dog). She also likes to listen to music to relax. She likes to play games on her phone.              Discussed insurance of client. She has [place] Medicaid. She said that her insurance is working well at present             Discussed mobility. She has difficulty walking. She does have a walker to use if needed.  She also has a cane to use as needed.               Discussed oxygen  use. She said she uses oxygen  24/7 at 3 liters during the day and uses oxygen  at 4  liters at night.  She uses CPAP machine to help her sleep. She said she is sleeping pretty well               Client said she does fatigue occasionally. She said she does get short of breath  occasionally.              Discussed family support from spouse .             Provided counseling support              Encouraged client to call LCSW as needed for SW support at [phone]               Client was appreciative of  call from LCSW today  Patient Goals/Self-Care Activities:  Attend scheduled medical appointments              Take medications as prescribed.               Contact local food pantries as needed               Communicate with NP Bascom GORMAN Borer as needed to discuss medical needs of client               Call LCSW as needed for SW support at [phone]  Plan:   Telephone follow up appointment with care management team member scheduled for:  06/06/24 at 1:00 PM         Please go to [facility] [address], [place] [phone]) if you are experiencing a Mental Health or Behavioral Health Crisis or need someone to talk to.  The patient verbalized understanding of instructions, educational materials, and care plan provided today and DECLINED offer to receive copy of patient instructions, educational materials, and care plan.    Claudia [name]  MSW, LCSW [place] Licensed Clinical Social Worker Direct Dial:  [phone] Fax:  [phone] Website:  [email or web address]

## 2024-05-01 NOTE — Patient Outreach (Signed)
 Complex Care Management   Visit Note  05/01/2024  Name:  Claudia [name] MRN: [id number] DOB: [birth year]  Situation: Referral received for Complex Care Management related to mental health needs and SDOH needs I obtained verbal consent from Patient.  Visit completed with patient   on the phone  Background:   Past Medical History:  Diagnosis Date   Diabetes mellitus    Fibroids    Headache(784.0)    History of echocardiogram    Echo 7/18: Moderate concentric LVH, EF 55-60, normal wall motion, grade 1 diastolic dysfunction, calcified aortic leaflets   Hypercholesterolemia    Hyperlipidemia    Hypertension    Osteoarthritis    , with mild meniscus tear, followed by orthopedics--Dr Heide 04/29/10   Right knee pain    Seizures (HCC)    as child   STEMI (ST elevation myocardial infarction) (HCC)    Vitamin D  deficiency     Assessment: Patient Reported Symptoms:  Cognitive Cognitive Status: Alert and oriented to person, place, and time Cognitive/Intellectual Conditions Management [RPT]: None reported or documented in medical history or problem list   Health Maintenance Behaviors: Sleep adequate, Stress management Health Facilitated by: Rest, Stress management  Neurological Neurological Review of Symptoms: Weakness, Headaches Neurological Management Strategies: Adequate rest, Coping strategies  HEENT HEENT Symptoms Reported: No symptoms reported HEENT Management Strategies: Adequate rest, Coping strategies    Cardiovascular Cardiovascular Symptoms Reported: Lightheadness, Fatigue Does patient have uncontrolled Hypertension?:  (patient was unsure of BP reading at present.) Cardiovascular Management Strategies: Adequate rest, Coping strategies  Respiratory Respiratory Symptoms Reported: Shortness of breath (uses oxygen  at 3 liters via nasal canula during the day. uses oxygen  at 4 liters via nasal canula at night) Other Respiratory Symptoms: shortness of breath sometimes when she is  walking Respiratory Management Strategies: Adequate rest, Coping strategies, Oxygen  therapy  Endocrine Endocrine Symptoms Reported: Weakness or fatigue, Shortness of breath Is patient diabetic?: Yes    Gastrointestinal Gastrointestinal Symptoms Reported: No symptoms reported Gastrointestinal Management Strategies: Adequate rest, Coping strategies    Genitourinary Genitourinary Symptoms Reported: Frequency, Urgency Additional Genitourinary Details: takes Lasix  as prescribed Genitourinary Management Strategies: Adequate rest, Coping strategies  Integumentary Integumentary Symptoms Reported: No symptoms reported Skin Management Strategies: Coping strategies  Musculoskeletal Musculoskelatal Symptoms Reviewed: Limited mobility, Difficulty walking, Joint pain, Weakness Musculoskeletal Management Strategies: Adequate rest, Coping strategies, Medical device      Psychosocial Psychosocial Symptoms Reported: Anxiety - if selected complete GAD, Sadness - if selected complete PHQ 2-9, Depression - if selected complete PHQ 2-9 Behavioral Management Strategies: Adequate rest, Community resources, Coping strategies, Counseling, Support system Major Change/Loss/Stressor/Fears (CP): Medical condition, self Techniques to [employer] with Loss/Stress/Change: Counseling, Diversional activities, Support group Quality of Family Relationships: supportive Do you feel physically threatened by others?: No      05/01/2024    1:28 PM  Depression screen PHQ 2/9  Decreased Interest 1  Down, Depressed, Hopeless 1  PHQ - 2 Score 2  Altered sleeping 1  Tired, decreased energy 1  Change in appetite 1  Feeling bad or failure about yourself  1  Trouble concentrating 1  Moving slowly or fidgety/restless 1  Suicidal thoughts 0  PHQ-9 Score 8  Difficult doing work/chores Somewhat difficult    Vitals:   Client said she was unsure of BP reading currently. She said last time she went to PCP office her BP was in normal  range  Medications Reviewed Today   Medications were not reviewed in this encounter  Recommendation:   PCP Follow-up Continue Current Plan of Care Take medications as prescribed Use coping skills of choice to help her alleviate stress and anxiety issues  Call LCSW as needed for SW support  Follow Up Plan:   LCSW to call client  on 06/06/24 at 1:00 PM    Glendia Pear  MSW, LCSW [place]/Value Based Care [facility] Licensed Clinical Social Worker Direct Dial:  [phone] Fax:  [phone] Website:  [email or web address]

## 2024-05-12 ENCOUNTER — Other Ambulatory Visit: Payer: Self-pay | Admitting: Nurse Practitioner

## 2024-05-12 DIAGNOSIS — D649 Anemia, unspecified: Secondary | ICD-10-CM

## 2024-05-24 ENCOUNTER — Other Ambulatory Visit: Payer: Self-pay | Admitting: Nurse Practitioner

## 2024-05-26 ENCOUNTER — Telehealth: Payer: Self-pay

## 2024-05-26 NOTE — Telephone Encounter (Signed)
 Pharmacy Patient Advocate Encounter  Received notification from [facility] MEDICAID that Prior Authorization for NEXLIZET  has been APPROVED from 05/26/2024 to 05/26/2025   PA #/Case ID/Reference #: [id number]

## 2024-06-06 ENCOUNTER — Other Ambulatory Visit: Payer: Self-pay

## 2024-06-06 ENCOUNTER — Other Ambulatory Visit: Payer: Self-pay | Admitting: Licensed Clinical Social Worker

## 2024-06-06 DIAGNOSIS — R051 Acute cough: Secondary | ICD-10-CM

## 2024-06-06 DIAGNOSIS — J9601 Acute respiratory failure with hypoxia: Secondary | ICD-10-CM

## 2024-06-06 NOTE — Patient Outreach (Signed)
 Complex Care Management   Visit Note  06/06/2024  Name:  Claudia [name] MRN: [id number] DOB: July 05, 1966  Situation: Referral received for Complex Care Management related to SDOH needs; stress issues; anxiety issues I obtained verbal consent from Patient.  Visit completed with Patient  on the phone  Background:   Past Medical History:  Diagnosis Date   Diabetes mellitus    Fibroids    Headache(784.0)    History of echocardiogram    Echo 7/18: Moderate concentric LVH, EF 55-60, normal wall motion, grade 1 diastolic dysfunction, calcified aortic leaflets   Hypercholesterolemia    Hyperlipidemia    Hypertension    Osteoarthritis    , with mild meniscus tear, followed by orthopedics--Dr Heide 04/29/10   Right knee pain    Seizures (HCC)    as child   STEMI (ST elevation myocardial infarction) (HCC)    Vitamin D  deficiency     Assessment: Patient Reported Symptoms:  Cognitive Cognitive Status: Alert and oriented to person, place, and time Cognitive/Intellectual Conditions Management [RPT]: None reported or documented in medical history or problem list   Health Maintenance Behaviors: Stress management, Sleep adequate Health Facilitated by: Stress management  Neurological Neurological Review of Symptoms: Weakness, Headaches Neurological Management Strategies: Adequate rest, Coping strategies  HEENT HEENT Symptoms Reported: No symptoms reported HEENT Management Strategies: Adequate rest, Coping strategies    Cardiovascular Cardiovascular Symptoms Reported: Fatigue, Lightheadness Does patient have uncontrolled Hypertension?: No Cardiovascular Management Strategies: Adequate rest, Coping strategies  Respiratory Respiratory Symptoms Reported: Shortness of breath Other Respiratory Symptoms: occasional shortness of breath Additional Respiratory Details: uses oxygen  as prescribed in the home Respiratory Management Strategies: Adequate rest, Coping strategies  Endocrine Endocrine  Symptoms Reported: Shortness of breath, Weakness or fatigue Is patient diabetic?: Yes    Gastrointestinal Gastrointestinal Symptoms Reported: No symptoms reported Gastrointestinal Management Strategies: Adequate rest, Coping strategies    Genitourinary Genitourinary Symptoms Reported: Frequency, Urgency Additional Genitourinary Details: takes lasix  as prescribed Genitourinary Management Strategies: Adequate rest, Coping strategies  Integumentary Integumentary Symptoms Reported: No symptoms reported Skin Management Strategies: Coping strategies  Musculoskeletal Musculoskelatal Symptoms Reviewed: Limited mobility, Difficulty walking, Weakness, Joint pain Musculoskeletal Management Strategies: Coping strategies      Psychosocial Psychosocial Symptoms Reported: Anxiety - if selected complete GAD, Sadness - if selected complete PHQ 2-9, Depression - if selected complete PHQ 2-9 Behavioral Management Strategies: Adequate rest, Coping strategies, Counseling Major Change/Loss/Stressor/Fears (CP): Medical condition, self Techniques to Cope with Loss/Stress/Change: Diversional activities, Counseling, Support group Quality of Family Relationships: supportive Do you feel physically threatened by others?: No    06/06/2024    PHQ2-9 Depression Screening   Little interest or pleasure in doing things Several days  Feeling down, depressed, or hopeless Several days  PHQ-2 - Total Score 2  Trouble falling or staying asleep, or sleeping too much Several days  Feeling tired or having little energy Several days  Poor appetite or overeating  Several days  Feeling bad about yourself - or that you are a failure or have let yourself or your family down Several days  Trouble concentrating on things, such as reading the newspaper or watching television Several days  Moving or speaking so slowly that other people could have noticed.  Or the opposite - being so fidgety or restless that you have been moving around a  lot more than usual Several days  Thoughts that you would be better off dead, or hurting yourself in some way Not at all  PHQ2-9 Total Score 8  If you checked off any problems, how difficult have these problems made it for you to do your work, take care of things at home, or get along with other people Somewhat difficult  Depression Interventions/Treatment Counseling    Vitals:  BP in normal range, per client information   Medications Reviewed Today     Reviewed by Frances Ozell GORMAN KEN (Social Worker) on 06/06/24 at 1335  Med List Status: <None>   Medication Order Taking? Sig Documenting Provider Last Dose Status Informant  ACCU-CHEK GUIDE TEST test strip [id number] Yes USE AS INSTRUCTED TO CHECK SUGAR FOUR TIMES DAILY Nichols, Tonya S, NP  Active   acetaminophen  (TYLENOL ) 500 MG tablet [id number] Yes Take 500 mg by mouth every 6 (six) hours as needed for mild pain or moderate pain. [provider]  Active Self  amLODipine  (NORVASC ) 5 MG tablet [id number] Yes Take 1 tablet (5 mg total) by mouth daily. Glena Raisin [place], [place]  Active   aspirin  81 MG EC tablet [id number] Yes Take 1 tablet (81 mg total) by mouth daily. Stroud, Natalie M, FNP  Active Self  Bempedoic Acid-Ezetimibe (NEXLIZET ) 180-10 MG TABS [id number] Yes Take 1 tablet by mouth daily. Oley Bascom GORMAN, NP  Active   blood glucose meter kit and supplies KIT [id number] Yes Dispense based on patient and insurance preference. Use up to four times daily as directed. (FOR ICD-9 250.00, 250.01). Stroud, Natalie M, FNP  Active Self  Blood Glucose Monitoring Suppl (ACCU-CHEK GUIDE) w/Device KIT [id number] Yes 1 each by Does not apply route in the morning, at noon, in the evening, and at bedtime. Oley Bascom GORMAN, NP  Active Self  carvedilol  (COREG ) 3.125 MG tablet [id number] Yes TAKE 1 TABLET BY MOUTH TWICE DAILY WITH MEALS Bensimhon, Toribio SAUNDERS, MD  Active   dapagliflozin  propanediol (FARXIGA ) 10 MG TABS tablet [id number] Yes Take 1  tablet (10 mg total) by mouth daily. [place], [place], [place]  Active   Dulaglutide  (TRULICITY ) 1.5 MG/0.5ML EMMANUEL [id number] Yes Inject 1.5 mg into the skin once a week. Nichols, Tonya S, NP  Active   Dulaglutide  (TRULICITY ) 3 MG/0.5ML EMMANUEL [id number] Yes Inject 3 mg into the skin once a week. Oley Bascom GORMAN, NP  Active   Ferrous Sulfate  (IRON ) 325 (65 Fe) MG TABS [id number] Yes Take 1 tablet by mouth once daily with breakfast Nichols, Tonya S, NP  Active   folic acid  (FOLVITE ) 800 MCG tablet [id number] Yes Take 0.5 tablets (400 mcg total) by mouth daily. Oley Bascom GORMAN, NP  Active   furosemide  (LASIX ) 80 MG tablet [id number] Yes Take 1 tablet (80 mg total) by mouth 2 (two) times daily. [place], [place], FNP  Active   Lancets (ACCU-CHEK MULTICLIX) lancets [id number] Yes Use as instructed to check blood sugar 4 times daily. Nichols, Tonya S, NP  Active Self  ofloxacin  (FLOXIN ) 0.3 % OTIC solution [id number] Yes Place 5 drops into the right ear daily. Oley Bascom GORMAN, NP  Active   potassium chloride  SA (KLOR-CON  M) 20 MEQ tablet [id number] Yes Take 1 tablet (20 mEq total) by mouth daily. Bensimhon, Toribio SAUNDERS, MD  Active   sacubitril -valsartan  (ENTRESTO ) 97-103 MG [id number] Yes Take 1 tablet by mouth twice daily Bensimhon, Daniel R, MD  Active   spironolactone  (ALDACTONE ) 25 MG tablet [id number] Yes Take 1 tablet by mouth once daily Bensimhon, Toribio SAUNDERS, MD  Active             Recommendation:   PCP Follow-up Continue Current Plan of  Care Call LCSW as needed for SW support Take medications as prescribed Talk with medical provider about use of oxygen  when outside of home Allow time for activities to help reduce stress and anxiety  Follow Up Plan:   Telephone follow up appointment date/time:  07/25/24 at 3:30 PM    Glendia Pear  MSW, LCSW [place]/Value Based Care [facility] Licensed Clinical Social Worker Direct Dial:  [phone] Fax:  [phone] Website:   [email or web address]

## 2024-06-06 NOTE — Patient Instructions (Signed)
 Visit Information  Thank you for taking time to visit with me today. Please don't hesitate to contact me if I can be of assistance to you before our next scheduled appointment.  Our next appointment is by telephone on 07/25/24 at 3:30 PM   Please call the care guide team at [phone] if you need to cancel or reschedule your appointment.   Following is a copy of your care plan:   Goals Addressed             This Visit's Progress    VBCI Social Work Care Plan       Problems:   Transport needs             Has pet dog for emotional support animal             Housing issues              Financial issues            Anxiety issues; Depression issues            Management of chronic illness issues  CSW Clinical Goal(s):   Over the next 30 days the Patient will attend all scheduled medical appointments as evidenced by patient report and care team review of appointment completion in electronic MEDICAL RECORD NUMBER .             Over next 30 days patient will use coping skills of choice to help her manage anxiety and depression issues faced AEB patient report of reduced issues with anxiety and depression  Interventions:  Spoke with client about her managing ADLs and daily needs              Discussed financial challenges.               Discussed medication procurement.  Discussed support from her spouse            Previously talked with Schuyler about her mood . She has said previously that she likes caring for her emotional support pet (dog). She also likes to listen to music to relax. She likes to play games on her phone.              Discussed insurance of client. She has [place] Medicaid. She said that her insurance is working well at present             Discussed mobility. She has difficulty walking. She does have a walker to use if needed.  She also has a cane to use as needed.               Discussed oxygen  use. She said she uses oxygen  24/7 at 3 liters during the day and uses oxygen  at 4  liters at night.  She uses CPAP machine to help her sleep. She said she is sleeping pretty well             Schuyler said she wants to talk with nurse at PCP office or with NP about her oxygen  needs when she leaves home. She has a portable tank for use outside the home but it only has oxygen  for about one hour. This limits how long client can be away from home and affects her quality of life. Again she wants to talk with her medical provider about options for oxygen  use when outside of the home               Client said she does fatigue  occasionally. She said she does get short of breath  occasionally.              Discussed family support from spouse .             Provided counseling support               Discussed pain issues of client. Discussed client use of relaxation activities to help reduce anxiety and stress faced              Encouraged client to call LCSW as needed for SW support at [phone]               Client was appreciative of call from LCSW today  Patient Goals/Self-Care Activities:  Attend scheduled medical appointments              Take medications as prescribed.               Contact local food pantries as needed               Communicate with NP Bascom GORMAN Borer as needed to discuss medical needs of client               Call LCSW as needed for SW support at [phone]  Plan:   Telephone follow up appointment with care management team member scheduled for:  07/25/24 at 3:30 PM         Please go to [facility] [address] [place] [phone]) if you are experiencing a Mental Health or Behavioral Health Crisis or need someone to talk to.  The patient verbalized understanding of instructions, educational materials, and care plan provided today and DECLINED offer to receive copy of patient instructions, educational materials, and care plan.     Glendia Pear  MSW, LCSW [place]/Value Based Care [facility] Licensed Clinical Social Worker Direct Dial:  [phone] Fax:  [phone] Website:  [email or web address]

## 2024-06-07 ENCOUNTER — Other Ambulatory Visit (INDEPENDENT_AMBULATORY_CARE_PROVIDER_SITE_OTHER): Payer: Self-pay

## 2024-06-07 DIAGNOSIS — E1169 Type 2 diabetes mellitus with other specified complication: Secondary | ICD-10-CM

## 2024-06-07 DIAGNOSIS — E785 Hyperlipidemia, unspecified: Secondary | ICD-10-CM

## 2024-06-07 NOTE — Progress Notes (Signed)
 06/07/2024 Name: Claudia [name] MRN: [id number] DOB: Dec 07, 1965  Chief Complaint  Patient presents with   Diabetes   Congestive Heart Failure    Claudia [name] is a 57 y.o. year old female who presented for a telephone visit.   They were referred to the pharmacist by their PCP for assistance in managing diabetes. PMH includes HTN, PSVT, CHF (EF 45% in March 2025) on 3L O3, T2DM, HLD, NSTEMI, BMI > 40.   Subjective: Patient was last seen by PCP on 03/17/24. A1C increased from 6.5% to 7.5%. She continues to have anxiety and depression worsened by current financial and housing situation. Patient LDL-C improved from 165 mg/dL to 97 mg/dL on Nexlizet . She was engaged by pharmacy via telephone on 04/24/24. She reported that she had increased Trulicity  to 1.5 mg weekly, but had not noticed any additional effects. She was instructed to increase Trulicity  to 3 mg at next fill.   Today, patient reports doing ok. She increased to Trulicity  3 mg at last fill. She reports she has noted increased weight due to fluid, but she is planning to attend cardiology appt next week. She also has had some dietary excursions eating more sugary food and soda, but overall thinks she is doing better than previously.   Care Team: Primary Care Provider: Oley Bascom RAMAN, NP ; Next Scheduled Visit: 06/19/24 (reports that she needs to switch this to after 2PM, currently scheduled at 8AM) Cardiologist: Dr. Bensimhon; Next Scheduled Visit: 06/14/24  Medication Access/Adherence  Current Pharmacy:  [facility] [address] [place] - [address] [place] [address] Phone: [phone] Fax: [phone]  [facility] [address] [place] [address] Phone: [phone] Fax: [phone]  [facility] [address] [place] [address] Phone: [phone] Fax:  [phone]   Patient reports affordability concerns with their medications: No  Patient reports access/transportation concerns to their pharmacy: No  Patient reports adherence concerns with their medications:  No    Insurance - [employer] Medicaid  Diabetes:  Current medications: Trulicity  3 mg weekly (Fridays), Farxiga  10 mg daily Medications tried in the past: Ozempic  2 mg (switched to Trulicity  d/t need for greater weight loss - insurance requires trial of 2 preferred GLP-1s prior to coverage of Mounjaro )  Patient reports that she has a feeling of fullness and bloating that occurs on and off. Unclear if this is due to GLP-1RA or abdominal fluid retention 2/2 HF. Denies nausea, vomiting, constipation.   Using glucometer; testing a couple times per week Random BG: this AM 129 mg/dL, had not checked any other numbers recently. Endorses one BG > 200 mg/dL after eating a carb-heavy meal.   Patient denies hypoglycemic s/sx including dizziness, shakiness, sweating. Patient denies hyperglycemic symptoms including polyuria, polydipsia, polyphagia, nocturia, neuropathy, blurred vision.  Current dietary habits - typically having ~2 meals per day - Brunch ~10 AM - cheese omelet with water, sprite - Dinner 5-6PM - salad - just vegetables, no protein. Sometimes has fish or chicken with it.  - Drinks - occasional soda, mostly water.   Current medication access support: Medicaid  Hyperlipidemia/ASCVD Risk Reduction  Current lipid lowering medications: Nexlizet  10-180 mg daily Medications tried in the past: atorvastatin  10 mg, rosuvastatin  5-10 mg, pravastatin - myalgias simvastatin - hives   Heart Failure (EF 45% on cMRI March 2025):  Current medications:  ACEi/ARB/ARNI: Entresto  97-103 mg BID SGLT2i: Farxiga   10 mg daily Beta blocker: carvedilol  3.125 mg BID Mineralocorticoid Receptor Antagonist: spironolactone  25 mg daily Diuretic regimen: furosemide  80 mg BID -reports that the  occasionally misses doses due side effect of frequent urination, but her husband has been encouraging her to take regularly.  Potassium 20 mEq daily  Current home blood pressure readings: not currently checking  Current home weights: last time she checked was 298 lbs.   Patient reports volume overload signs or symptoms including  lower extremity edema when she misses doses of furosemide  or drinks soda.   Reports her weight is up to 306 lbs  - thinks this was from drinking sodas. Reports mild ShOB with walking - has to take a lot of breaks (uses oxygen  at baseline). Sleeps on 3 pillows overnight (increased from 2 pillows).    Objective:  BP Readings from Last 3 Encounters:  03/17/24 138/77  12/13/23 128/84  09/16/23 (!) 146/75   Lab Results  Component Value Date   HGBA1C 7.5 (A) 03/17/2024   HGBA1C 6.5 (A) 10/20/2023   HGBA1C 5.7 (A) 04/26/2023    Lab Results  Component Value Date   CREATININE 0.81 03/17/2024   BUN 22 03/17/2024   NA 142 03/17/2024   K 4.4 03/17/2024   CL 99 03/17/2024   CO2 28 03/17/2024    Lab Results  Component Value Date   CHOL 164 03/17/2024   HDL 55 03/17/2024   LDLCALC 97 03/17/2024   TRIG 62 03/17/2024   CHOLHDL 3.0 03/17/2024    Medications Reviewed Today     Reviewed by Brinda Lorain SQUIBB, RPH (Pharmacist) on 06/07/24 at 1449  Med List Status: <None>   Medication Order Taking? Sig Documenting Provider Last Dose Status Informant  ACCU-CHEK GUIDE TEST test strip [id number]  USE AS INSTRUCTED TO CHECK SUGAR FOUR TIMES DAILY Nichols, Tonya S, NP  Active   acetaminophen  (TYLENOL ) 500 MG tablet [id number]  Take 500 mg by mouth every 6 (six) hours as needed for mild pain or moderate pain. [provider]  Active Self  amLODipine  (NORVASC ) 5 MG tablet [id number] Yes Take 1 tablet (5 mg total) by mouth daily. Glena Raisin [place], [place]  Active   aspirin  81 MG EC tablet [id number]  Take 1 tablet (81 mg total) by mouth daily. Stroud, Natalie M, FNP   Active Self  Bempedoic Acid-Ezetimibe (NEXLIZET ) 180-10 MG TABS [id number] Yes Take 1 tablet by mouth daily. Oley Bascom RAMAN, NP  Active   blood glucose meter kit and supplies KIT [id number]  Dispense based on patient and insurance preference. Use up to four times daily as directed. (FOR ICD-9 250.00, 250.01). Stroud, Natalie M, FNP  Active Self  Blood Glucose Monitoring Suppl (ACCU-CHEK GUIDE) w/Device KIT [id number]  1 each by Does not apply route in the morning, at noon, in the evening, and at bedtime. Oley Bascom RAMAN, NP  Active Self  carvedilol  (COREG ) 3.125 MG tablet [id number] Yes TAKE 1 TABLET BY MOUTH TWICE DAILY WITH MEALS Bensimhon, Toribio SAUNDERS, MD  Active   dapagliflozin  propanediol (FARXIGA ) 10 MG TABS tablet [id number] Yes Take 1 tablet (10 mg total) by mouth daily. Glena Raisin [place], [place]  Active     Discontinued 06/07/24 1414 (Dose change)   Dulaglutide  (TRULICITY ) 3 MG/0.5ML SOAJ [id number] Yes Inject 3 mg into the skin once a week. Oley Bascom RAMAN, NP  Active   Ferrous Sulfate  (IRON ) 325 (65 Fe) MG TABS [id number]  Take 1 tablet by mouth once daily with breakfast Nichols, Tonya S, NP  Active   folic acid  (FOLVITE ) 800 MCG tablet [id number]  Take 0.5 tablets (400 mcg total) by mouth daily. Oley Bascom RAMAN, NP  Active   furosemide  (LASIX ) 80 MG tablet [id number] Yes Take 1 tablet (80 mg total) by mouth 2 (two) times daily. [place], [place], FNP  Active   Lancets (ACCU-CHEK MULTICLIX) lancets [id number]  Use as instructed to check blood sugar 4 times daily. Nichols, Tonya S, NP  Active Self  ofloxacin  (FLOXIN ) 0.3 % OTIC solution [id number]  Place 5 drops into the right ear daily. Oley Bascom RAMAN, NP  Active   potassium chloride  SA (KLOR-CON  M) 20 MEQ tablet [id number] Yes Take 1 tablet (20 mEq total) by mouth daily. Bensimhon, Toribio SAUNDERS, MD  Active   sacubitril -valsartan  (ENTRESTO ) 97-103 MG [id number] Yes Take 1 tablet by mouth twice daily Bensimhon, Daniel R, MD  Active   spironolactone   (ALDACTONE ) 25 MG tablet [id number] Yes Take 1 tablet by mouth once daily Bensimhon, Toribio SAUNDERS, MD  Active               Assessment/Plan:   Diabetes: - Currently uncontrolled based on last A1C 7.5% above goal < 7% and worsened from 6.5% on 10/20/23 in the setting of transitioning from Ozempic  to Trulicity  in attempt to pursue insurance coverage of Mounjaro . She is tolerating Trulicity  3 mg weekly, appropriate to continue. Consider trying to submit PA for Mounjaro  after next fill, as GI AE may prevent further dose titration.  - UACR due now: March 2024 < 5 mg/g - Reviewed long term cardiovascular and renal outcomes of uncontrolled blood sugar - Reviewed goal A1c, goal fasting, and goal 2 hour post prandial glucose - Recommend to continue Trulicity  to 3 mg weekly at next fill  - Recommend to continue Farxiga  10 mg daily  - Patient denies personal or family history of multiple endocrine neoplasia type 2, medullary thyroid  cancer; personal history of pancreatitis or gallbladder disease. - Recommend to check fasting blood glucose daily - Next A1C Sept 2025   Hyperlipidemia/ASCVD Risk Reduction: - Currently uncontrolled based on last LDL 97 mg/dL above goal < 70 mg/dL, but much improved from 165 mg/dL since starting Nexlizet  (~40% decrease). Patient would be a good candidate for PCSK9i combination therapy given hx of premature ASCVD. Will plan to discuss when patient's living and financial situation have stabilized.  - Reviewed long term complications of uncontrolled cholesterol - Reviewed dietary recommendations including limiting intake of processed foods - Recommend to continue Nexlizet  (bempedoic acid-ezetimibe) 180-10 mg daily.    Heart Failure (EF 45% in March 2025): - Currently appropriately managed. Patient follows with AHF and has upcoming appointment. Discussed fluid and salt restriction today given recent worsening in edema/swelling.  - Reviewed appropriate blood pressure  monitoring technique and reviewed goal blood pressure - Reviewed to weigh daily and when to contact cardiology with weight gain - Recommend to continue current regimen:   ACEi/ARB/ARNI: Entresto  97-103 mg BID SGLT2i: Farxiga  10 mg daily Beta blocker: carvedilol  3.125 mg BID Mineralocorticoid Receptor Antagonist: spironolactone  25 mg daily Diuretic regimen: furosemide  80 mg BID Potassium 20 mEq daily   Follow Up Plan: PharmD 06/07/24, PCP 06/19/24  Lorain Baseman, PharmD [facility] [phone]

## 2024-06-12 ENCOUNTER — Telehealth: Payer: Self-pay | Admitting: Nurse Practitioner

## 2024-06-12 ENCOUNTER — Other Ambulatory Visit: Payer: Self-pay | Admitting: Nurse Practitioner

## 2024-06-12 DIAGNOSIS — D649 Anemia, unspecified: Secondary | ICD-10-CM

## 2024-06-12 NOTE — Telephone Encounter (Signed)
 Copied from CRM #[id number]. Topic: Clinical - Medical Advice >> Jun 12, 2024 10:53 AM Tiffini S wrote: Reason for CRM: Patient have a big oxygen  task and two little portable machines with [employer]- asked for a call back from [facility]- need a portable oxygen  that is plug able in the car or when she is out She received a MyCHART that the request was submitted with [employer] [facility].  Patient is asking for a call back about when the equipment will be received at [phone].

## 2024-06-13 ENCOUNTER — Other Ambulatory Visit (HOSPITAL_COMMUNITY): Payer: Self-pay

## 2024-06-13 ENCOUNTER — Other Ambulatory Visit: Payer: Self-pay

## 2024-06-13 DIAGNOSIS — R051 Acute cough: Secondary | ICD-10-CM

## 2024-06-13 DIAGNOSIS — J9601 Acute respiratory failure with hypoxia: Secondary | ICD-10-CM

## 2024-06-14 ENCOUNTER — Ambulatory Visit (HOSPITAL_COMMUNITY)
Admission: RE | Admit: 2024-06-14 | Discharge: 2024-06-14 | Disposition: A | Discharge status: Home / Self Care | Source: Ambulatory Visit | Attending: Internal Medicine | Admitting: Internal Medicine

## 2024-06-14 VITALS — BP 152/92 | HR 94 | Wt 309.0 lb

## 2024-06-14 DIAGNOSIS — E66812 Obesity, class 2: Secondary | ICD-10-CM | POA: Diagnosis not present

## 2024-06-14 DIAGNOSIS — I5022 Chronic systolic (congestive) heart failure: Secondary | ICD-10-CM | POA: Insufficient documentation

## 2024-06-14 DIAGNOSIS — G4733 Obstructive sleep apnea (adult) (pediatric): Secondary | ICD-10-CM

## 2024-06-14 DIAGNOSIS — I10 Essential (primary) hypertension: Secondary | ICD-10-CM

## 2024-06-14 DIAGNOSIS — I272 Pulmonary hypertension, unspecified: Secondary | ICD-10-CM

## 2024-06-14 DIAGNOSIS — I493 Ventricular premature depolarization: Secondary | ICD-10-CM | POA: Diagnosis not present

## 2024-06-14 DIAGNOSIS — I4510 Unspecified right bundle-branch block: Secondary | ICD-10-CM | POA: Diagnosis not present

## 2024-06-14 DIAGNOSIS — I5032 Chronic diastolic (congestive) heart failure: Secondary | ICD-10-CM

## 2024-06-14 MED ORDER — AMLODIPINE BESYLATE 10 MG PO TABS: 10.0000 mg | ORAL_TABLET | Freq: Every day | ORAL | 4 refills | Status: AC

## 2024-06-14 NOTE — Patient Instructions (Addendum)
 INCREASE Amlodipine  to 10 mg daily  Your physician has requested that you have an echocardiogram. Echocardiography is a painless test that uses sound waves to create images of your heart. It provides your doctor with information about the size and shape of your heart and how well your heart's chambers and valves are working. This procedure takes approximately one hour. There are no restrictions for this procedure. Please do NOT wear cologne, perfume, aftershave, or lotions (deodorant is allowed). Please arrive 15 minutes prior to your appointment time.  Please note: We ask at that you not bring children with you during ultrasound (echo/ vascular) testing. Due to room size and safety concerns, children are not allowed in the ultrasound rooms during exams. Our front office staff cannot provide observation of children in our lobby area while testing is being conducted. An adult accompanying a patient to their appointment will only be allowed in the ultrasound room at the discretion of the ultrasound technician under special circumstances. We apologize for any inconvenience.  Your physician recommends that you schedule a follow-up appointment in: 9 months (June 2026) ** PLEASE CALL THE OFFICE IN MARCH 2026 TO ARRANGE YOUR FOLLOW UP APPOINTMENT.**  If you have any questions or concerns before your next appointment please send us  a message through [facility] or call our office at [phone].    TO LEAVE A MESSAGE FOR THE NURSE SELECT OPTION 2, PLEASE LEAVE A MESSAGE INCLUDING: YOUR NAME DATE OF BIRTH CALL BACK NUMBER REASON FOR CALL**this is important as we prioritize the call backs  YOU WILL RECEIVE A CALL BACK THE SAME DAY AS LONG AS YOU CALL BEFORE 4:00 PM  At the [facility], you and your health needs are our priority. As part of our continuing mission to provide you with exceptional heart care, we have created designated Provider Care Teams. These Care Teams include your primary  Cardiologist (physician) and Advanced Practice Providers (APPs- Physician Assistants and Nurse Practitioners) who all work together to provide you with the care you need, when you need it.   You may see any of the following providers on your designated Care Team at your next follow up: Dr Toribio Fuel Dr Ezra Shuck Dr. Ria Commander Dr. Morene Brownie Amy Lenetta, NP Caffie Shed, [place] [facility] [place], [place] Beckey Coe, NP [place] Lee, NP Ellouise Class, NP Tinnie Redman, PharmD Jaun Bash, PharmD   Please be sure to bring in all your medications bottles to every appointment.    Thank you for choosing [facility]

## 2024-06-14 NOTE — Progress Notes (Signed)
 [facility]   Primary Care:Bascom Borer NP  Primary Cardiologist: Dr Jeffrie  HF MD: Dr Rosalynd  Chief complaint: HF  HPI: Claudia [name] is a 58 y.o. with morbid obesity, HFpEF, NSVT, DMII, IDA, and HTN.   Cath 2019 for CP - normal cors   Admitted 11/23 with A/C HFpEF. Discharge weight 278 pounds.   She was seen HF TOC 08/26/22 and set up for RHC. (See below)  Sleep Study - Severe OSA. AHI 60 and nocturnal hypoxia.   RHC 09/16/22 - Mild mixed pulmonary HTN with mildly elevated left-sided filling pressures.  Admitted 3/24 with a/c dHF. Diuresed and lasix  increased to 80 bid. D/c weight 247  Echo 45% with septal HK D shaped ventricle  Zio 4/24 Sinus. 2.7% PVCs  Echo 05/17/23 EF 60-65% small circumferential effusion. RV ok   PYP 11/24 equivocal --> cMRI 3/25 LVEF 45% RVEF 47% minimal LGE. No infiltrative process  Today she returns for HF follow up with her husband. Overall feeling fair. Has good days and bad days. Previous school nutritionist but now on disability. Remains very fatigued. Continues with O2. Under a lot of stress. Lost her place to stay and now got an apt. Lives on 2nd floor. Is on Trulicity  but gained 30 pounds this year. Says she was drinking a lot soda.   Cardiac Testing  - PYP 11/24: equivocal  - Echo 8/24: EF 60-65%, small circumferential effusion, RV ok  - RHC 09/16/22  RA = 2 RV = 46/5 PA = 48/30 (37) PCW = 20 Fick cardiac output/index = 7.4/3.2 Thermo CO/CI = 8.7/3.8 PVR = 2.3 WU Ao sat = 93% PA sat = 70%, 71% 1. Mild mixed pulmonary with mildly elevated left-sided filling pressures Continue medical therapy and weight loss efforts. No role for selective pulmonary vasodilators.   Past Medical History:  Diagnosis Date   Diabetes mellitus    Fibroids    Headache(784.0)    History of echocardiogram    Echo 7/18: Moderate concentric LVH, EF 55-60, normal wall motion, grade 1 diastolic dysfunction, calcified aortic leaflets    Hypercholesterolemia    Hyperlipidemia    Hypertension    Osteoarthritis    , with mild meniscus tear, followed by orthopedics--Dr Heide 04/29/10   Right knee pain    Seizures (HCC)    as child   STEMI (ST elevation myocardial infarction) (HCC)    Vitamin D  deficiency    Current Outpatient Medications  Medication Sig Dispense Refill   ACCU-CHEK GUIDE TEST test strip USE AS INSTRUCTED TO CHECK SUGAR FOUR TIMES DAILY 100 each 0   acetaminophen  (TYLENOL ) 500 MG tablet Take 500 mg by mouth every 6 (six) hours as needed for mild pain or moderate pain.     amLODipine  (NORVASC ) 5 MG tablet Take 1 tablet (5 mg total) by mouth daily. 180 tablet 3   aspirin  81 MG EC tablet Take 1 tablet (81 mg total) by mouth daily. 30 tablet 6   Bempedoic Acid-Ezetimibe (NEXLIZET ) 180-10 MG TABS Take 1 tablet by mouth daily. 30 tablet 11   blood glucose meter kit and supplies KIT Dispense based on patient and insurance preference. Use up to four times daily as directed. (FOR ICD-9 250.00, 250.01). 1 each 0   Blood Glucose Monitoring Suppl (ACCU-CHEK GUIDE) w/Device KIT 1 each by Does not apply route in the morning, at noon, in the evening, and at bedtime. 1 kit 0   carvedilol  (COREG ) 3.125 MG tablet TAKE 1 TABLET BY  MOUTH TWICE DAILY WITH MEALS 60 tablet 6   dapagliflozin  propanediol (FARXIGA ) 10 MG TABS tablet Take 1 tablet (10 mg total) by mouth daily. 90 tablet 3   Dulaglutide  (TRULICITY ) 3 MG/0.5ML SOAJ Inject 3 mg into the skin once a week. 2 mL 2   Ferrous Sulfate  (IRON ) 325 (65 Fe) MG TABS Take 1 tablet by mouth once daily with breakfast 30 tablet 0   folic acid  (FOLVITE ) 800 MCG tablet Take 0.5 tablets (400 mcg total) by mouth daily. 30 tablet 2   furosemide  (LASIX ) 80 MG tablet Take 1 tablet (80 mg total) by mouth 2 (two) times daily. 180 tablet 3   Lancets (ACCU-CHEK MULTICLIX) lancets Use as instructed to check blood sugar 4 times daily. 204 each 12   ofloxacin  (FLOXIN ) 0.3 % OTIC solution Place 5 drops  into the right ear daily. 5 mL 0   potassium chloride  SA (KLOR-CON  M) 20 MEQ tablet Take 1 tablet (20 mEq total) by mouth daily. 90 tablet 3   sacubitril -valsartan  (ENTRESTO ) 97-103 MG Take 1 tablet by mouth twice daily 60 tablet 5   spironolactone  (ALDACTONE ) 25 MG tablet Take 1 tablet by mouth once daily 90 tablet 3   No current facility-administered medications for this encounter.   Allergies  Allergen Reactions   Beef-Derived Drug Products     Patient does not eat beef   Crestor  [Rosuvastatin ]     MUSCLE ACHES HEADACHES    Diltiazem  Hives   Hydrocodone Itching   Other Hives, Swelling and Other (See Comments)    Vision changes; ALLERGIC TO ALL NUTS EXCEPT PEANUTS   Oxycodone  Itching   Pork-Derived Products     For dietary, patient does not eat pork   Pravachol [Pravastatin Sodium] Itching    Joint pain   Simvastatin Hives   Social History   Socioeconomic History   Marital status: Married    Spouse name: Manus   Number of children: 6   Years of education: Not on file   Highest education level: High school graduate  Occupational History   Occupation: Nutrition @ [employer]    Comment: applying for disability  Tobacco Use   Smoking status: Never   Smokeless tobacco: Never  Vaping Use   Vaping status: Never Used  Substance and Sexual Activity   Alcohol use: No   Drug use: No   Sexual activity: Yes  Other Topics Concern   Not on file  Social History Narrative   Not on file   Social Drivers of Health   Financial Resource Strain: Medium Risk (04/03/2024)   Overall Financial Resource Strain (CARDIA)    Difficulty of Paying Living Expenses: Somewhat hard  Food Insecurity: Food Insecurity Present (04/03/2024)   Hunger Vital Sign    Worried About Running Out of Food in the Last Year: Sometimes true    Ran Out of Food in the Last Year: Sometimes true  Transportation Needs: No Transportation Needs (04/03/2024)   PRAPARE - Administrator, Civil Service  (Medical): No    Lack of Transportation (Non-Medical): No  Physical Activity: Inactive (06/06/2024)   Exercise Vital Sign    Days of Exercise per Week: 0 days    Minutes of Exercise per Session: 0 min  Stress: Stress Concern Present (06/06/2024)   [employer] of Occupational Health - Occupational Stress Questionnaire    Feeling of Stress: To some extent  Social Connections: Moderately Integrated (03/17/2024)   Social Connection and Isolation Panel    Frequency  of Communication with Friends and Family: More than three times a week    Frequency of Social Gatherings with Friends and Family: More than three times a week    Attends Religious Services: More than 4 times per year    Active Member of [employer] or Organizations: No    Attends Banker Meetings: Never    Marital Status: Married  Catering manager Violence: Not At Risk (04/03/2024)   Humiliation, Afraid, Rape, and Kick questionnaire    Fear of Current or Ex-Partner: No    Emotionally Abused: No    Physically Abused: No    Sexually Abused: No   Family History  Problem Relation Age of Onset   Hypertension Mother    Diabetes Mother    Hypercholesterolemia Mother    Hypertension Sister    CVA Maternal Grandmother    Hypertension Maternal Grandmother    Diabetes Maternal Grandmother    Heart attack Neg Hx    Breast cancer Neg Hx    Wt Readings from Last 3 Encounters:  06/14/24 (!) 140.2 kg (309 lb)  03/17/24 134.7 kg (297 lb)  12/13/23 127 kg (280 lb)   BP (!) 152/92   Pulse 94   Wt (!) 140.2 kg (309 lb)   LMP 05/09/2012   SpO2 99%   BMI 46.98 kg/m   PHYSICAL EXAM: General:  Sitting on exam table No resp difficulty HEENT: normal wearing O2 Neck: supple. no JVD. Carotids 2+ bilat; no bruits. No lymphadenopathy or thryomegaly appreciated. Cor: PMI nondisplaced. Regular rate & rhythm. No rubs, gallops or murmurs. Lungs: clear Abdomen: obese soft, nontender, nondistended. No hepatosplenomegaly. No bruits or  masses. Good bowel sounds. Extremities: no cyanosis, clubbing, rash, edema Neuro: alert & orientedx3, cranial nerves grossly intact. moves all 4 extremities w/o difficulty. Affect pleasant  ECG (personally reviewed): Sinus 92 No ST-T wave abnormalities. One PVC Personally reviewed   ASSESSMENT & PLAN:  1. Chronic HFmrEF and RV moderately reduced - Echo 2019 EF 55-60% with normal RV.  - Echo 07/2022 EF 55-60% & RV moderately reduced.  - Echo 3/24: EF 45% with septal HK D-shape septum - LHC 2019 normal cors - RHC 12/23 RA 2 PA 48/30 (37) PCW 20 Fick 7.4/3.2 Thermo 8.7/3.8 PVR 2.3 WU - Echo 05/17/23 EF 60-65% small circumfrential effusion. RV ok -> RV strain on echo no longer present   - PYP 11/24 equivocal - cMRI 3/25 LVEF 45% RVEF 47% minimal LGE. No infiltrative process - NYHA III, suspect chronic at this point. Functional status confounded by body habitus and pulmonary issues. Volume ok today, weight is up >25 lbs in 7 months, she attributes this to home stress/dietary indiscretion (see below) - Volume status ok despite weight gain. Continue Lasix  80 mg bid + 20 KCL daily. - Continue Entresto  97/103 bid - Continue spiro 25 mg daily.  - Continue Farxiga  10 mg daily. - Continue Coreg  3.125 mg bid - Due for repeat echo   2. PAH, mild (post-capillary) - Evidence of RV strain on echo has now resolved with supplemental O2 and BiPAP - Suspect WHO GROUP 2 (HF) and 3 (OSA/OHS) - VQ scan April 2023 which was negative. - Continue BIPAP - She graduated from [employer]  - Repeat echo   3. Chronic hypoxic respiratory failure - Mostly due to OHS (WHO Group III) - Continue O2, bipap and weight loss - Finished Pulm rehab  4. HTN - Blood pressure up in setting of weight gain  - Increase amlodipine  to  10 - F/u with PCP  5. Morbid Obesity  - Body mass index is 46.98 kg/m. - Weight increasing on Trulicity . Would she be candidate for Zepbound? - Per PCP  6. DMII - Most recent Hgb A1C 6.5   - On Farxiga  10 mg daily.  - per PCP  7. Sleep Apnea, severe - AHI 60 and nocturnal hypoxia.  - Continue BIPAP and weight loss efforts  8. PVCs, frequent - Zio 4/24 Sinus. 2.7% PVCs - 2 PVCs on ECG today. - Continue BiPap  Toribio Fuel, MD  10:06 AM

## 2024-06-19 ENCOUNTER — Ambulatory Visit (INDEPENDENT_AMBULATORY_CARE_PROVIDER_SITE_OTHER): Payer: Self-pay | Admitting: Nurse Practitioner

## 2024-06-19 ENCOUNTER — Ambulatory Visit: Payer: Self-pay | Admitting: Nurse Practitioner

## 2024-06-19 ENCOUNTER — Encounter: Payer: Self-pay | Admitting: Nurse Practitioner

## 2024-06-19 VITALS — BP 125/61 | HR 94 | Wt 309.0 lb

## 2024-06-19 DIAGNOSIS — I5032 Chronic diastolic (congestive) heart failure: Secondary | ICD-10-CM

## 2024-06-19 DIAGNOSIS — E785 Hyperlipidemia, unspecified: Secondary | ICD-10-CM

## 2024-06-19 DIAGNOSIS — E1169 Type 2 diabetes mellitus with other specified complication: Secondary | ICD-10-CM

## 2024-06-19 LAB — POCT GLYCOSYLATED HEMOGLOBIN (HGB A1C): Hemoglobin A1C: 7.5 % — AB (ref 4.0–5.6)

## 2024-06-19 NOTE — Progress Notes (Addendum)
 Subjective   Patient ID: Claudia [name], female    DOB: 05-25-1966, 58 y.o.   MRN: [id number]  Chief Complaint  Patient presents with   Diabetes   Medical Management of Chronic Issues    Need portable o2     Referring provider: Oley Bascom RAMAN, NP  Claudia [name] is a 58 y.o. female with Past Medical History: No date: Diabetes mellitus No date: Fibroids No date: Headache(784.0) No date: History of echocardiogram     Comment:  Echo 7/18: Moderate concentric LVH, EF 55-60, normal               wall motion, grade 1 diastolic dysfunction, calcified               aortic leaflets No date: Hypercholesterolemia No date: Hyperlipidemia No date: Hypertension No date: Osteoarthritis     Comment:  , with mild meniscus tear, followed by orthopedics--Dr               Heide 04/29/10 No date: Right knee pain No date: Seizures (HCC)     Comment:  as child No date: STEMI (ST elevation myocardial infarction) (HCC) No date: Vitamin D  deficiency   HPI    CHF: Patient is on Lasix  80 mg BID and has follow up with cardiology. Patient continues on 3L O2 daily and 4L at night with CPAP. Patient does have OSA.  Has been participating in pulmonary rehab.  Patient did have a walk test in office today.  Please see notes below.  She is requesting a small informal oxygen  tanks such as [employer].  We will place order for patient today.   Diabetes:   A1C in office today is 7.5. Taking farxiga  10 mg daily and Trulicity . CBGs were mostly within goal. Patient no longer taking glipizide . Following with pharmacy.    Aortic atherosclerosis noted on CT On aspirin  81 mg daily. Has prior allergies to pravastatin, rosuvastatin , and atorvastatin . Lipid panel here WNL w/ exception of LDL 140. Consulted with cardiology. Continues on fish oil  currently.    IDA Stable. On Ferrous sulfate  325 mg daily at home.   Denies f/c/s, n/v/d, hemoptysis, PND, leg swelling Denies chest pain or edema    Allergies   Allergen Reactions   Beef-Derived Drug Products     Patient does not eat beef   Crestor  [Rosuvastatin ]     MUSCLE ACHES HEADACHES    Diltiazem  Hives   Hydrocodone Itching   Other Hives, Swelling and Other (See Comments)    Vision changes; ALLERGIC TO ALL NUTS EXCEPT PEANUTS   Oxycodone  Itching   Pork-Derived Products     For dietary, patient does not eat pork   Pravachol [Pravastatin Sodium] Itching    Joint pain   Simvastatin Hives    Immunization History  Administered Date(s) Administered   Pneumococcal Polysaccharide-23 03/01/2017   Tdap 03/01/2017    Tobacco History: Social History   Tobacco Use  Smoking Status Never  Smokeless Tobacco Never   Counseling given: Not Answered   Outpatient Encounter Medications as of 06/19/2024  Medication Sig   ACCU-CHEK GUIDE TEST test strip USE AS INSTRUCTED TO CHECK SUGAR FOUR TIMES DAILY   acetaminophen  (TYLENOL ) 500 MG tablet Take 500 mg by mouth every 6 (six) hours as needed for mild pain or moderate pain.   amLODipine  (NORVASC ) 10 MG tablet Take 1 tablet (10 mg total) by mouth daily.   aspirin  81 MG EC tablet Take 1 tablet (81 mg total) by mouth  daily.   Bempedoic Acid-Ezetimibe (NEXLIZET ) 180-10 MG TABS Take 1 tablet by mouth daily.   blood glucose meter kit and supplies KIT Dispense based on patient and insurance preference. Use up to four times daily as directed. (FOR ICD-9 250.00, 250.01).   Blood Glucose Monitoring Suppl (ACCU-CHEK GUIDE) w/Device KIT 1 each by Does not apply route in the morning, at noon, in the evening, and at bedtime.   carvedilol  (COREG ) 3.125 MG tablet TAKE 1 TABLET BY MOUTH TWICE DAILY WITH MEALS   dapagliflozin  propanediol (FARXIGA ) 10 MG TABS tablet Take 1 tablet (10 mg total) by mouth daily.   Dulaglutide  (TRULICITY ) 3 MG/0.5ML SOAJ Inject 3 mg into the skin once a week.   Ferrous Sulfate  (IRON ) 325 (65 Fe) MG TABS Take 1 tablet by mouth once daily with breakfast   folic acid  (FOLVITE ) 800 MCG  tablet Take 0.5 tablets (400 mcg total) by mouth daily.   furosemide  (LASIX ) 80 MG tablet Take 1 tablet (80 mg total) by mouth 2 (two) times daily.   Lancets (ACCU-CHEK MULTICLIX) lancets Use as instructed to check blood sugar 4 times daily.   ofloxacin  (FLOXIN ) 0.3 % OTIC solution Place 5 drops into the right ear daily.   potassium chloride  SA (KLOR-CON  M) 20 MEQ tablet Take 1 tablet (20 mEq total) by mouth daily.   sacubitril -valsartan  (ENTRESTO ) 97-103 MG Take 1 tablet by mouth twice daily   spironolactone  (ALDACTONE ) 25 MG tablet Take 1 tablet by mouth once daily   No facility-administered encounter medications on file as of 06/19/2024.    Review of Systems  Review of Systems  Constitutional: Negative.   HENT: Negative.    Cardiovascular: Negative.   Gastrointestinal: Negative.   Allergic/Immunologic: Negative.   Neurological: Negative.   Psychiatric/Behavioral: Negative.       Objective:   BP 125/61   Pulse 94   Wt (!) 309 lb (140.2 kg)   LMP 05/09/2012   SpO2 90%   BMI 46.98 kg/m   Wt Readings from Last 5 Encounters:  06/19/24 (!) 309 lb (140.2 kg)  06/14/24 (!) 309 lb (140.2 kg)  03/17/24 297 lb (134.7 kg)  12/13/23 280 lb (127 kg)  10/20/23 272 lb 9.6 oz (123.7 kg)     Physical Exam Vitals and nursing note reviewed.  Constitutional:      General: She is not in acute distress.    Appearance: She is well-developed.  Cardiovascular:     Rate and Rhythm: Normal rate and regular rhythm.  Pulmonary:     Effort: Pulmonary effort is normal.     Breath sounds: Normal breath sounds.  Neurological:     Mental Status: She is alert and oriented to person, place, and time.     Diabetic Foot Exam - Simple   Simple Foot Form Diabetic Foot exam was performed with the following findings: Yes 06/19/2024  1:31 PM  Visual Inspection No deformities, no ulcerations, no other skin breakdown bilaterally: Yes Sensation Testing Intact to touch and monofilament testing  bilaterally: Yes Pulse Check Posterior Tibialis and Dorsalis pulse intact bilaterally: Yes Comments       Assessment & Plan:   Type 2 diabetes mellitus with hyperlipidemia (HCC) -     POCT glycosylated hemoglobin (Hb A1C)  Chronic diastolic congestive heart failure (HCC) -     Microalbumin / creatinine urine ratio -     PR PORTABLE OXYGEN  CONCENTRATOR    Six minute walk test Resting room air =  93 Room air walking =  89 Oxygen  Liters while ambulating = 97 on 3 liters of o2 and 3 liters pulse dose    Return in about 3 months (around 09/18/2024).   Bascom GORMAN Borer, NP 06/19/2024

## 2024-06-20 LAB — MICROALBUMIN / CREATININE URINE RATIO
Creatinine, Urine: 61 mg/dL
Microalb/Creat Ratio: <5 mg/g{creat} (ref 0–29)
Microalbumin, Urine: <3 ug/mL

## 2024-06-21 ENCOUNTER — Other Ambulatory Visit: Payer: Self-pay

## 2024-07-04 ENCOUNTER — Encounter (INDEPENDENT_AMBULATORY_CARE_PROVIDER_SITE_OTHER): Payer: Self-pay

## 2024-07-05 ENCOUNTER — Ambulatory Visit (HOSPITAL_COMMUNITY)
Admission: RE | Admit: 2024-07-05 | Discharge: 2024-07-05 | Disposition: A | Discharge status: Home / Self Care | Source: Ambulatory Visit | Attending: Internal Medicine | Admitting: Internal Medicine

## 2024-07-05 DIAGNOSIS — I252 Old myocardial infarction: Secondary | ICD-10-CM | POA: Diagnosis not present

## 2024-07-05 DIAGNOSIS — I2510 Atherosclerotic heart disease of native coronary artery without angina pectoris: Secondary | ICD-10-CM | POA: Diagnosis not present

## 2024-07-05 DIAGNOSIS — I110 Hypertensive heart disease with heart failure: Secondary | ICD-10-CM | POA: Diagnosis not present

## 2024-07-05 DIAGNOSIS — I5022 Chronic systolic (congestive) heart failure: Secondary | ICD-10-CM | POA: Diagnosis present

## 2024-07-05 LAB — ECHOCARDIOGRAM COMPLETE
AR max vel: 3.14 cm2
AV Area VTI: 2.82 cm2
AV Area mean vel: 3.21 cm2
AV Mean grad: 6 mmHg
AV Peak grad: 9.9 mmHg
Ao pk vel: 1.57 m/s
Area-P 1/2: 4.46 cm2
Calc EF: 57.7 %
S' Lateral: 3.5 cm
Single Plane A2C EF: 58.2 %
Single Plane A4C EF: 56.7 %

## 2024-07-09 NOTE — Addendum Note (Signed)
 Encounter addended by: Batoul Limes R, MD on: 07/09/2024 11:53 AM  Actions taken: Clinical Note Signed, Level of Service modified, Diagnosis association updated, Visit diagnoses modified

## 2024-07-14 ENCOUNTER — Telehealth: Payer: Self-pay

## 2024-07-14 NOTE — Telephone Encounter (Signed)
 Copied from CRM #[id number]. Topic: Clinical - Medical Advice >> Jul 14, 2024  2:13 PM Nathanel BROCKS wrote: Reason for CRM: pt called to see when is she getting her portable oxygen  from [facility]. No one has called with results and this oxygen . Please call pt.

## 2024-07-19 NOTE — Telephone Encounter (Signed)
 Ask for update from [facility]

## 2024-07-25 ENCOUNTER — Other Ambulatory Visit: Payer: Self-pay | Admitting: Licensed Clinical Social Worker

## 2024-07-25 NOTE — Patient Instructions (Signed)
 Visit Information  Thank you for taking time to visit with me today. Please don't hesitate to contact me if I can be of assistance to you before our next scheduled appointment.  Our next appointment is by telephone on 08/14/2024 at 9:00 AM   Please call the care guide team at [phone] if you need to cancel or reschedule your appointment.   Following is a copy of your care plan:   Goals Addressed             This Visit's Progress    VBCI Social Work Care Plan       Problems:   Transport needs             Has pet dog for emotional support animal             Housing issues              Financial issues            Anxiety issues; Depression issues            Management of chronic illness issues  CSW Clinical Goal(s):   Over the next 30 days the Patient will attend all scheduled medical appointments as evidenced by patient report and care team review of appointment completion in electronic MEDICAL RECORD NUMBER .             Over next 30 days patient will use coping skills of choice to help her manage anxiety and depression issues faced AEB patient report of reduced issues with anxiety and depression  Interventions:  Spoke with client about her managing ADLs and daily needs             Discussed medication procurement.  Discussed support from her spouse            Previously talked with Schuyler about her mood . She has said previously that she likes caring for her emotional support pet (dog). She also likes to listen to music to relax. She likes to play games on her phone.              She has difficulty walking. She does have a walker to use if needed.  She also has a cane to use as needed.               Discussed oxygen  use. She said she uses oxygen  24/7 at 3 liters during the day and uses oxygen  at 4 liters at night.  She uses CPAP machine to help her sleep. She said she is sleeping pretty well. She said she has appointment this week with [facility] to monitor her oxygen  needs.                 Schuyler said she wants to talk with nurse at PCP office or with NP about her oxygen  needs when she leaves home. She has a portable tank for use outside the home but it only has oxygen  for about one hour. This limits how long client can be away from home and affects her quality of life. Again she wants to talk with her medical provider about options for oxygen  use when outside of the home               Client said she does fatigue occasionally. She said she does get short of breath  occasionally.                Provided counseling support  Encouraged client to call LCSW as needed for SW support at [phone]               Client was appreciative of call from LCSW today  Patient Goals/Self-Care Activities:  Attend scheduled medical appointments              Take medications as prescribed.               Contact local food pantries as needed               Communicate with NP Bascom GORMAN Borer as needed to discuss medical needs of client               Call LCSW as needed for SW support at [phone]                Attend appointment this week with [facility] to monitor client oxygen  needs  Plan:   Telephone follow up appointment with care management team member scheduled for:  08/14/2024 at 9:00 AM         Please go to [facility] [address] [place] [phone]) if you are experiencing a Mental Health or Behavioral Health Crisis or need someone to talk to.  Patient verbalized understanding of Care plan and visit instructions communicated this visit  Glendia Pear  MSW, LCSW /Value Based Care [facility] Licensed Clinical Social Worker Direct Dial:  [phone] Fax:  [phone] Website:  [email or web address]

## 2024-07-25 NOTE — Patient Outreach (Deleted)
 Complex Care Management   Visit Note  07/25/2024  Name:  Claudia [name] MRN: [id number] DOB: [birth year]  Situation: Referral received for Complex Care Management related to {Criteria:32550} I obtained verbal consent from {CHL AMB Patient/Caregiver:28184}.  Visit completed with {CHL AMB Patient/Caregiver:28184}  {VISIT LOCATION:32553}  Background:   Past Medical History:  Diagnosis Date   Diabetes mellitus    Fibroids    Headache(784.0)    History of echocardiogram    Echo 7/18: Moderate concentric LVH, EF 55-60, normal wall motion, grade 1 diastolic dysfunction, calcified aortic leaflets   Hypercholesterolemia    Hyperlipidemia    Hypertension    Osteoarthritis    , with mild meniscus tear, followed by orthopedics--Dr Heide 04/29/10   Right knee pain    Seizures (HCC)    as child   STEMI (ST elevation myocardial infarction) (HCC)    Vitamin D  deficiency     Assessment: Patient Reported Symptoms:  Cognitive        Neurological      HEENT        Cardiovascular      Respiratory      Endocrine      Gastrointestinal        Genitourinary      Integumentary      Musculoskeletal          Psychosocial            07/25/2024    PHQ2-9 Depression Screening   Little interest or pleasure in doing things    Feeling down, depressed, or hopeless    PHQ-2 - Total Score    Trouble falling or staying asleep, or sleeping too much    Feeling tired or having little energy    Poor appetite or overeating     Feeling bad about yourself - or that you are a failure or have let yourself or your family down    Trouble concentrating on things, such as reading the newspaper or watching television    Moving or speaking so slowly that other people could have noticed.  Or the opposite - being so fidgety or restless that you have been moving around a lot more than usual    Thoughts that you would be better off dead, or hurting yourself in some way    PHQ2-9 Total Score    If  you checked off any problems, how difficult have these problems made it for you to do your work, take care of things at home, or get along with other people    Depression Interventions/Treatment      There were no vitals filed for this visit.  Medications Reviewed Today   Medications were not reviewed in this encounter     Recommendation:   {RECOMMENDATONS:32554}  Follow Up Plan:   {FOLLOWUP:32559}  SIG ***

## 2024-07-25 NOTE — Patient Outreach (Signed)
 Complex Care Management   Visit Note  07/25/2024  Name:  Claudia [name] MRN: [id number] DOB: Dec 30, 1965  Situation: Referral received for Complex Care Management related to mental health needs. Daily SDOH needs (daily use of oxygen  in the home) I obtained verbal consent from Patient.  Visit completed with Patient  on the phone  Background:   Past Medical History:  Diagnosis Date   Diabetes mellitus    Fibroids    Headache(784.0)    History of echocardiogram    Echo 7/18: Moderate concentric LVH, EF 55-60, normal wall motion, grade 1 diastolic dysfunction, calcified aortic leaflets   Hypercholesterolemia    Hyperlipidemia    Hypertension    Osteoarthritis    , with mild meniscus tear, followed by orthopedics--Dr Heide 04/29/10   Right knee pain    Seizures (HCC)    as child   STEMI (ST elevation myocardial infarction) (HCC)    Vitamin D  deficiency     Assessment: Patient Reported Symptoms:  Cognitive Cognitive Status: Alert and oriented to person, place, and time Cognitive/Intellectual Conditions Management [RPT]: None reported or documented in medical history or problem list   Health Maintenance Behaviors: Stress management Health Facilitated by: Stress management  Neurological Neurological Review of Symptoms: Weakness, Headaches Neurological Management Strategies: Adequate rest, Coping strategies  HEENT HEENT Symptoms Reported: No symptoms reported HEENT Management Strategies: Coping strategies, Adequate rest    Cardiovascular Cardiovascular Symptoms Reported: Lightheadness, Fatigue Does patient have uncontrolled Hypertension?: No Cardiovascular Management Strategies: Coping strategies  Respiratory Respiratory Symptoms Reported: Shortness of breath Other Respiratory Symptoms: occasional shortness of breath Additional Respiratory Details: uses oxygen  as prescribed in the home Respiratory Management Strategies: Coping strategies  Endocrine Endocrine Symptoms Reported:  Shortness of breath, Weakness or fatigue Is patient diabetic?: Yes    Gastrointestinal Gastrointestinal Symptoms Reported: No symptoms reported Gastrointestinal Management Strategies: Coping strategies    Genitourinary Genitourinary Symptoms Reported: Frequency, Urgency Genitourinary Management Strategies: Adequate rest, Coping strategies  Integumentary Integumentary Symptoms Reported: No symptoms reported Skin Management Strategies: Coping strategies  Musculoskeletal Musculoskelatal Symptoms Reviewed: Limited mobility, Difficulty walking, Weakness, Joint pain Musculoskeletal Management Strategies: Coping strategies      Psychosocial Psychosocial Symptoms Reported: Sadness - if selected complete PHQ 2-9, Anxiety - if selected complete GAD Additional Psychological Details: anxiety and depression Behavioral Management Strategies: Coping strategies, Adequate rest Major Change/Loss/Stressor/Fears (CP): Medical condition, self Techniques to Cope with Loss/Stress/Change: Diversional activities, Counseling, Support group      07/25/2024    PHQ2-9 Depression Screening   Little interest or pleasure in doing things Several days  Feeling down, depressed, or hopeless Several days  PHQ-2 - Total Score 2  Trouble falling or staying asleep, or sleeping too much Several days  Feeling tired or having little energy More than half the days  Poor appetite or overeating  Several days  Feeling bad about yourself - or that you are a failure or have let yourself or your family down Several days  Trouble concentrating on things, such as reading the newspaper or watching television Not at all  Moving or speaking so slowly that other people could have noticed.  Or the opposite - being so fidgety or restless that you have been moving around a lot more than usual Several days  Thoughts that you would be better off dead, or hurting yourself in some way Not at all  PHQ2-9 Total Score 8  If you checked off any  problems, how difficult have these problems made it for you to do  your work, take care of things at home, or get along with other people Somewhat difficult  Depression Interventions/Treatment Counseling (monitor for counseling needs of client)    Vitals:  Client did not mention any problems with BP readings during call today with LCSW   Medications Reviewed Today   Medications were not reviewed in this encounter     Recommendation:   PCP Follow-up Continue Current Plan of Care Attend scheduled appointment this week with [facility]  Call LCSW as needed for SW support  Follow Up Plan:   Telephone follow up appointment date/time:  08/14/2024 at 9:00 AM   Glendia Pear  MSW, LCSW [place]/Value Based Care [facility] Licensed Clinical Social Worker Direct Dial:  [phone] Fax:  [phone] Website:  [email or web address]

## 2024-07-26 ENCOUNTER — Ambulatory Visit: Payer: Self-pay

## 2024-07-26 DIAGNOSIS — E1169 Type 2 diabetes mellitus with other specified complication: Secondary | ICD-10-CM

## 2024-07-26 DIAGNOSIS — E785 Hyperlipidemia, unspecified: Secondary | ICD-10-CM

## 2024-07-26 MED ORDER — MOUNJARO 5 MG/0.5ML ~~LOC~~ SOAJ: 5.0000 mg | SUBCUTANEOUS | 1 refills | Status: DC

## 2024-07-26 NOTE — Progress Notes (Signed)
 07/26/2024 Name: Claudia [name] MRN: [id number] DOB: October 15, 1965  Chief Complaint  Patient presents with   Diabetes   Congestive Heart Failure    Claudia [name] is a 58 y.o. year old female who presented for a telephone visit.   They were referred to the pharmacist by their PCP for assistance in managing diabetes. PMH includes HTN, PSVT, CHF (EF 45% in March 2025) on 3L O3, T2DM, HLD, NSTEMI, BMI > 40.   Subjective: Patient was last seen by PCP on 03/17/24. A1C increased from 6.5% to 7.5%. She continues to have anxiety and depression worsened by current financial and housing situation. Patient LDL-C improved from 165 mg/dL to 97 mg/dL on Nexlizet . She was engaged by pharmacy via telephone on 04/24/24. She reported that she had increased Trulicity  to 1.5 mg weekly, but had not noticed any additional effects. She was instructed to increase Trulicity  to 3 mg at next fill. At pharmacy telephone call on 06/07/24, no changes were made - patient was encouraged to f/u with AHF clinic for weight gain. She saw Dr. Cherrie on 06/14/24. BP was elevated, so amlodipine  was increased to 10 mg daily. She appeared euvolemic despite weight gain. BP was controlled at OV with PCP on 06/19/24. A1C remained stable at 7.5%.   Today, patient reports doing ok. She has current supplies of all her medications. She is interested in switching from Trulicity  to Mounjaro  if it will help with weight loss.    Care Team: Primary Care Provider: Oley Bascom RAMAN, NP ; Next Scheduled Visit: 09/18/24 Cardiologist: Dr. Bensimhon; Next Scheduled Visit: follow-up due June 2026  Medication Access/Adherence  Current Pharmacy:  [facility] [address], [place] - [address] [place] [address] Phone: [phone] Fax: [phone]  [facility] - [facility] [address] [place] [address] Phone: [phone] Fax:  [phone]  [facility] [address] [place] [address] Phone: [phone] Fax: [phone]   Patient reports affordability concerns with their medications: No  Patient reports access/transportation concerns to their pharmacy: No  Patient reports adherence concerns with their medications:  No    Insurance - [employer]  Diabetes:  Current medications: Trulicity  3 mg weekly (Fridays - has 2 pens remaining in current box), Farxiga  10 mg daily Medications tried in the past: Ozempic  2 mg (switched to Trulicity  d/t need for greater weight loss - insurance requires trial of 2 preferred GLP-1s prior to coverage of Mounjaro )  Denies GI AE currently with Trulicity  3 mg weekly. Denies nausea, vomiting, constipation. She does have bloating and fullness after a large meal.  Using glucometer; testing a couple times per week FBG ~120 mg/dL, other random BG of 840 mg/dL. Lowest 98 mg/dL (later in the evening). Recalls one BG > 200 mg/dL at 87JF after eating candy and chips.  Patient denies hypoglycemic s/sx including dizziness, shakiness, sweating. Patient denies hyperglycemic symptoms including polyuria, polydipsia, polyphagia, nocturia, neuropathy, blurred vision.   Current dietary habits - typically having ~2 meals per day - Brunch ~10 AM - cheese omelet with water, sprite - Dinner 5-6PM - salad - just vegetables, no protein. Sometimes has fish or chicken with it.  - Drinks - occasional soda, mostly water.   Current medication access support: Medicaid [facility])  Hyperlipidemia/ASCVD Risk Reduction  Current lipid lowering medications: Nexlizet  10-180 mg daily Medications tried in the past: atorvastatin  10 mg, rosuvastatin  5-10 mg, pravastatin -  myalgias simvastatin - hives   Heart Failure (EF 45% on cMRI March 2025):  Current medications:  ACEi/ARB/ARNI: Entresto  97-103 mg BID SGLT2i: Farxiga  10 mg daily Beta blocker:  carvedilol  3.125 mg BID Mineralocorticoid Receptor Antagonist: spironolactone  25 mg daily Diuretic regimen: furosemide  80 mg BID -reports that the occasionally misses doses due side effect of frequent urination, but her husband has been encouraging her to take regularly.  Potassium 20 mEq daily Other antihypertensives: amlodipine  10 mg daily  Current home blood pressure readings: not currently checking - does have a blood pressure cuff  Patient reports volume overload signs or symptoms including lower extremity edema when she misses doses of furosemide  or drinks soda, but recently reports improved adherence to furosemide .   Reports mild ShOB with walking - has to take a lot of breaks (uses oxygen  at baseline). Sleeps on 2 pillows overnight.  Current physical activity: walks around the grocery store, but has to take a lot of breaks   Objective:  BMI Readings from Last 3 Encounters:  06/19/24 46.98 kg/m  06/14/24 46.98 kg/m  03/17/24 45.16 kg/m   Wt Readings from Last 3 Encounters:  06/19/24 (!) 309 lb (140.2 kg)  06/14/24 (!) 309 lb (140.2 kg)  03/17/24 297 lb (134.7 kg)   BP Readings from Last 3 Encounters:  06/19/24 125/61  06/14/24 (!) 152/92  03/17/24 138/77   Lab Results  Component Value Date   HGBA1C 7.5 (A) 06/19/2024   HGBA1C 7.5 (A) 03/17/2024   HGBA1C 6.5 (A) 10/20/2023    Lab Results  Component Value Date   CREATININE 0.81 03/17/2024   BUN 22 03/17/2024   NA 142 03/17/2024   K 4.4 03/17/2024   CL 99 03/17/2024   CO2 28 03/17/2024    Lab Results  Component Value Date   CHOL 164 03/17/2024   HDL 55 03/17/2024   LDLCALC 97 03/17/2024   TRIG 62 03/17/2024   CHOLHDL 3.0 03/17/2024    Medications Reviewed Today     Reviewed by Brinda Lorain SQUIBB, RPH (Pharmacist) on 07/26/24 at 1808  Med List Status: <None>   Medication Order Taking? Sig Documenting Provider Last Dose Status Informant  ACCU-CHEK GUIDE TEST test strip [id number]  USE AS INSTRUCTED TO  CHECK SUGAR FOUR TIMES DAILY Nichols, Tonya S, NP  Active   acetaminophen  (TYLENOL ) 500 MG tablet [id number]  Take 500 mg by mouth every 6 (six) hours as needed for mild pain or moderate pain. [provider]  Active Self  amLODipine  (NORVASC ) 10 MG tablet [id number] Yes Take 1 tablet (10 mg total) by mouth daily. Bensimhon, Toribio SAUNDERS, MD  Active   aspirin  81 MG EC tablet [id number] Yes Take 1 tablet (81 mg total) by mouth daily. Stroud, Natalie M, FNP  Active Self  Bempedoic Acid-Ezetimibe (NEXLIZET ) 180-10 MG TABS [id number] Yes Take 1 tablet by mouth daily. Oley Bascom RAMAN, NP  Active   blood glucose meter kit and supplies KIT [id number]  Dispense based on patient and insurance preference. Use up to four times daily as directed. (FOR ICD-9 250.00, 250.01). Stroud, Natalie M, FNP  Active Self  Blood Glucose Monitoring Suppl (ACCU-CHEK GUIDE) w/Device KIT [id number]  1 each by Does not apply route in the morning, at noon, in the evening, and at bedtime. Oley Bascom RAMAN, NP  Active Self  carvedilol  (COREG ) 3.125 MG tablet [id number] Yes TAKE 1 TABLET BY MOUTH TWICE DAILY WITH MEALS Bensimhon, Toribio SAUNDERS, MD  Active   dapagliflozin  propanediol (FARXIGA ) 10 MG TABS  tablet [id number] Yes Take 1 tablet (10 mg total) by mouth daily. Glena Raisin Nicholson, [place]  Active    Discontinued 07/26/24 1807 (Change in therapy)            Med Note>> Dante Jeannine HERO, CMA   06/14/2024  9:54 AM Trying to switch to Mounjaro     Ferrous Sulfate  (IRON ) 325 (65 Fe) MG TABS [id number]  Take 1 tablet by mouth once daily with breakfast Nichols, Tonya S, NP  Active   folic acid  (FOLVITE ) 800 MCG tablet [id number]  Take 0.5 tablets (400 mcg total) by mouth daily. Oley Bascom RAMAN, NP  Active   furosemide  (LASIX ) 80 MG tablet [id number] Yes Take 1 tablet (80 mg total) by mouth 2 (two) times daily. Manton, Meno, FNP  Active   Lancets (ACCU-CHEK MULTICLIX) lancets [id number]  Use as instructed to check blood sugar 4 times daily.  Oley Bascom RAMAN, NP  Active Self  ofloxacin  (FLOXIN ) 0.3 % OTIC solution [id number]  Place 5 drops into the right ear daily. Oley Bascom RAMAN, NP  Active   potassium chloride  SA (KLOR-CON  M) 20 MEQ tablet [id number] Yes Take 1 tablet (20 mEq total) by mouth daily. Bensimhon, Daniel R, MD  Active   sacubitril -valsartan  (ENTRESTO ) 97-103 MG [id number] Yes Take 1 tablet by mouth twice daily Bensimhon, Daniel R, MD  Active   spironolactone  (ALDACTONE ) 25 MG tablet [id number] Yes Take 1 tablet by mouth once daily Bensimhon, Toribio SAUNDERS, MD  Active   tirzepatide  (MOUNJARO ) 5 MG/0.5ML Pen [id number] Yes Inject 5 mg into the skin once a week. Oley Bascom RAMAN, NP  Active               Assessment/Plan:   Diabetes: - Currently uncontrolled based on last A1C 7.5% above goal < 7%. Now that patient has had repeat A1C on maximum tolerated Trulicity , we will pursue insurance coverage of Mounjaro  for more potent glycemic control and weight loss, as she has now tried Ozempic  (not achieving weight loss goals) and Trulicity  (not achieving glycemic goals, and unable to titrate past 3 mg weekly due to GI side effects). She also has alternative indication for Mounjaro  (tirzepatide ) - which is severe OSA with AHI of 60 - UACR Sept 2025 - < 5 mg/g - Reviewed long term cardiovascular and renal outcomes of uncontrolled blood sugar - Reviewed goal A1c, goal fasting, and goal 2 hour post prandial glucose - Recommend to finish current supply of Trulicity  3 mg weekly, then switch to Mounjaro  5 mg weekly. Patient does not need to start at Mounjaro  2.5 mg as she is already tolerating high-dose Trulicity , and previously tolerated Ozempic  up to 2 mg weekly. Will collaborate with patient advocate to submit PA. - Recommend to continue Farxiga  10 mg daily  - Patient denies personal or family history of multiple endocrine neoplasia type 2, medullary thyroid  cancer; personal history of pancreatitis or gallbladder disease. - Recommend to  check fasting blood glucose daily - Next A1C Dec 2025   Hyperlipidemia/ASCVD Risk Reduction: - Currently uncontrolled based on last LDL 97 mg/dL above goal < 70 mg/dL, but much improved from 165 mg/dL since starting Nexlizet  (~40% decrease). Patient would be a good candidate for PCSK9i combination therapy given hx of premature ASCVD. Will plan to discuss at follow-up, as I prioritized switching to Mounjaro  today. - Reviewed long term complications of uncontrolled cholesterol - Reviewed dietary recommendations including limiting intake of processed foods - Recommend to continue Nexlizet  (bempedoic acid-ezetimibe) 180-10 mg daily.  Heart Failure (EF 45% in March 2025): - Currently appropriately managed. Patient follows with AHF clinic. Agree with attempting to transition to Mounjaro  for additional glycemic control and weight loss benefit. - Reviewed appropriate blood pressure monitoring technique and reviewed goal blood pressure - Reviewed to weigh daily and when to contact cardiology with weight gain - Recommend to continue current regimen:   ACEi/ARB/ARNI: Entresto  97-103 mg BID SGLT2i: Farxiga  10 mg daily Beta blocker: carvedilol  3.125 mg BID Mineralocorticoid Receptor Antagonist: spironolactone  25 mg daily Diuretic regimen: furosemide  80 mg BID Potassium 20 mEq daily Other antihypertensives: amlodipine  10 mg daily   Follow Up Plan: PharmD 09/01/24, PCP 09/18/24  Lorain Baseman, PharmD [facility] [phone]

## 2024-07-28 ENCOUNTER — Other Ambulatory Visit: Payer: Self-pay

## 2024-07-28 ENCOUNTER — Telehealth: Payer: Self-pay

## 2024-07-28 NOTE — Telephone Encounter (Signed)
 Pharmacy Patient Advocate Encounter  Received notification from [facility] MEDICAID that Prior Authorization for MOUNJARO  has been APPROVED from 07/27/2024 to 07/27/2025   PA #/Case ID/Reference #: [id number]

## 2024-08-01 ENCOUNTER — Other Ambulatory Visit: Payer: Self-pay | Admitting: Nurse Practitioner

## 2024-08-01 DIAGNOSIS — D649 Anemia, unspecified: Secondary | ICD-10-CM

## 2024-08-14 ENCOUNTER — Other Ambulatory Visit: Payer: Self-pay | Admitting: Licensed Clinical Social Worker

## 2024-08-14 NOTE — Patient Instructions (Signed)
 Visit Information  Thank you for taking time to visit with me today. Please don't hesitate to contact me if I can be of assistance to you before our next scheduled appointment.  Our next appointment is by telephone on 09/19/24 at 10:00 AM   Please call the care guide team at [phone] if you need to cancel or reschedule your appointment.   Following is a copy of your care plan:   Goals Addressed             This Visit's Progress    VBCI Social Work Care Plan       Problems:   Transport needs             Has pet dog for emotional support animal             Housing issues              Financial issues            Anxiety issues; Depression issues            Management of chronic illness issues  CSW Clinical Goal(s):   Over the next 30 days the Patient will attend all scheduled medical appointments as evidenced by patient report and care team review of appointment completion in electronic MEDICAL RECORD NUMBER .             Over next 30 days patient will use coping skills of choice to help her manage anxiety and depression issues faced AEB patient report of reduced issues with anxiety and depression (enjoys caring for pet dog, enjoys listening to music, enjoys games on her phone)  Interventions:  Spoke with client about her managing ADLs and daily needs              Spoke with client about Food Stamps benefit she receives. Her Food Stamps benefit has decreased             Discussed medication procurement.  Discussed support from her spouse            Talked with Schuyler about her mood . She said that she likes caring for her emotional support pet (dog). She also likes to listen to music to relax. She likes to play games on her phone.              She has difficulty walking. She does have a walker to use if needed.  She also has a cane to use as needed. She gets short of breath occasionally             Discussed oxygen  use. She said she uses oxygen  24/7 at 3 liters during the day and  uses oxygen  at 4 liters at night.  She uses CPAP machine to help her sleep. She said she is sleeping pretty well.                Discussed financial needs of client . She said she has some financial stressors. She sometimes gets some financial help from her family members                Schuyler said she talked with NP about her oxygen  needs when she leaves home. She has a portable tank for use outside the home but it only has oxygen  for about one hour. This limits how long client can be away from home and affects her quality of life. Client talked with NP about oxygen  needs and has talked with [employer] about oxygen  needs. She said she is going to continue to use current portable oxygen  system               Client said she does fatigue occasionally. She said she does get short of breath  occasionally.                Provided counseling support               Discussed support of her spouse, (he does laundry, he helps with cooking, home cleaning)               Used Active Listening techniques to hear client needs               Encouraged client to call LCSW as needed for SW support at [phone]               Client was appreciative of call from LCSW today  Patient Goals/Self-Care Activities:  Attend scheduled medical appointments              Take medications as prescribed.               Contact local food pantries as needed               Communicate with NP Bascom GORMAN Borer as needed to discuss medical needs of client               Call LCSW as needed for SW support at [phone]                  Plan:   Telephone follow up appointment with care management team member scheduled for:  09/19/24 at 10:00 AM         Please go to [facility] [address], [place] [phone]) if you are experiencing a Mental Health or Behavioral Health Crisis or need someone to talk to.  Patient verbalized understanding of Care plan and visit instructions  communicated this visit   Glendia Pear  MSW, LCSW [place]/Value Based Care [facility] Licensed Clinical Social Worker Direct Dial:  [phone] Fax:  [phone] Website:  [email or web address]

## 2024-08-14 NOTE — Patient Outreach (Signed)
 Complex Care Management   Visit Note  08/14/2024  Name:  Claudia [name] MRN: [id number] DOB: 1966/07/09  Situation: Referral received for Complex Care Management related to anxiety and depression issues  I obtained verbal consent from Patient.  Visit completed with Patient  on the phone  Background:   Past Medical History:  Diagnosis Date   Diabetes mellitus    Fibroids    Headache(784.0)    History of echocardiogram    Echo 7/18: Moderate concentric LVH, EF 55-60, normal wall motion, grade 1 diastolic dysfunction, calcified aortic leaflets   Hypercholesterolemia    Hyperlipidemia    Hypertension    Osteoarthritis    , with mild meniscus tear, followed by orthopedics--Dr Heide 04/29/10   Right knee pain    Seizures (HCC)    as child   STEMI (ST elevation myocardial infarction) (HCC)    Vitamin D  deficiency     Assessment: Patient Reported Symptoms:  Cognitive Cognitive Status: Alert and oriented to person, place, and time, Able to follow simple commands Cognitive/Intellectual Conditions Management [RPT]: None reported or documented in medical history or problem list   Health Maintenance Behaviors: Stress management Health Facilitated by: Stress management  Neurological Neurological Review of Symptoms: Headaches, Weakness Neurological Management Strategies: Adequate rest, Coping strategies  HEENT HEENT Symptoms Reported: No symptoms reported HEENT Management Strategies: Coping strategies    Cardiovascular Cardiovascular Symptoms Reported: Lightheadness, Fatigue Does patient have uncontrolled Hypertension?: No Cardiovascular Management Strategies: Coping strategies  Respiratory Respiratory Symptoms Reported: Shortness of breath Additional Respiratory Details: uses oxygen  as presribed in the home Respiratory Management Strategies: Coping strategies  Endocrine Endocrine Symptoms Reported: Weakness or fatigue, Shortness of breath Is patient diabetic?: Yes     Gastrointestinal Gastrointestinal Symptoms Reported: No symptoms reported Gastrointestinal Management Strategies: Coping strategies    Genitourinary Genitourinary Symptoms Reported: Frequency, Urgency Genitourinary Management Strategies: Adequate rest, Coping strategies  Integumentary Integumentary Symptoms Reported: No symptoms reported Skin Management Strategies: Coping strategies  Musculoskeletal Musculoskelatal Symptoms Reviewed: Limited mobility, Muscle pain, Difficulty walking, Weakness Musculoskeletal Management Strategies: Coping strategies      Psychosocial Psychosocial Symptoms Reported: Sadness - if selected complete PHQ 2-9, Depression - if selected complete PHQ 2-9, Anxiety - if selected complete GAD Behavioral Management Strategies: Coping strategies Major Change/Loss/Stressor/Fears (CP): Medical condition, self Techniques to Cope with Loss/Stress/Change: Diversional activities, Counseling, Support group      08/14/2024    PHQ2-9 Depression Screening   Little interest or pleasure in doing things Several days  Feeling down, depressed, or hopeless Several days  PHQ-2 - Total Score 2  Trouble falling or staying asleep, or sleeping too much Several days  Feeling tired or having little energy More than half the days  Poor appetite or overeating  Several days  Feeling bad about yourself - or that you are a failure or have let yourself or your family down Several days  Trouble concentrating on things, such as reading the newspaper or watching television Not at all  Moving or speaking so slowly that other people could have noticed.  Or the opposite - being so fidgety or restless that you have been moving around a lot more than usual Several days  Thoughts that you would be better off dead, or hurting yourself in some way Not at all  PHQ2-9 Total Score 8  If you checked off any problems, how difficult have these problems made it for you to do your work, take care of things at  home, or get along with other  people Somewhat difficult  Depression Interventions/Treatment Counseling    Vitals:   Pain Scale: 0-10 Pain Score: 6  Pain Type: Chronic pain Pain Location: Knee Pain Descriptors / Indicators: Aching Patients Stated Pain Goal: 1 Pain Intervention(s): Relaxation  Medications Reviewed Today   Medications were not reviewed in this encounter     Recommendation:   PCP Follow-up Continue Current Plan of Care Call LCSW as needed for SW support  Use stress reduction techniques of choice to help her manage anxiety and stress issues  Follow Up Plan:   Telephone follow up appointment date/time:  09/19/24 at 10:00 AM   Glendia Pear  MSW, LCSW [place]/Value Based Care [facility] Licensed Clinical Social Worker Direct Dial:  [phone] Fax:  [phone] Website:  [email or web address]

## 2024-08-31 ENCOUNTER — Other Ambulatory Visit (HOSPITAL_COMMUNITY): Payer: Self-pay | Admitting: Internal Medicine

## 2024-08-31 DIAGNOSIS — I5033 Acute on chronic diastolic (congestive) heart failure: Secondary | ICD-10-CM

## 2024-08-31 DIAGNOSIS — I10 Essential (primary) hypertension: Secondary | ICD-10-CM

## 2024-09-01 ENCOUNTER — Other Ambulatory Visit: Payer: Self-pay

## 2024-09-01 DIAGNOSIS — E1169 Type 2 diabetes mellitus with other specified complication: Secondary | ICD-10-CM

## 2024-09-01 MED ORDER — TIRZEPATIDE 7.5 MG/0.5ML ~~LOC~~ SOAJ: 7.5000 mg | SUBCUTANEOUS | 2 refills | Status: DC

## 2024-09-01 MED ORDER — TIRZEPATIDE 7.5 MG/0.5ML ~~LOC~~ SOAJ: 7.5000 mg | SUBCUTANEOUS | 2 refills | Status: AC

## 2024-09-01 NOTE — Progress Notes (Signed)
 09/01/2024 Name: MANDY PEEKS MRN: [id number] DOB: 03-02-1966  Chief Complaint  Patient presents with   Diabetes   Congestive Heart Failure    BERENIZE GATLIN is a 58 y.o. year old female who presented for a telephone visit.   They were referred to the pharmacist by their PCP for assistance in managing diabetes. PMH includes HTN, PSVT, CHF (EF 45% in March 2025) on 3L O3, T2DM, HLD, NSTEMI, BMI > 40.   Subjective: Patient was last seen by PCP on 03/17/24. A1C increased from 6.5% to 7.5%. She continues to have anxiety and depression worsened by current financial and housing situation. Patient LDL-C improved from 165 mg/dL to 97 mg/dL on Nexlizet . She was engaged by pharmacy via telephone on 04/24/24. She reported that she had increased Trulicity  to 1.5 mg weekly, but had not noticed any additional effects. She was instructed to increase Trulicity  to 3 mg at next fill. At pharmacy telephone call on 06/07/24, no changes were made - patient was encouraged to f/u with AHF clinic for weight gain. She saw Dr. Cherrie on 06/14/24. BP was elevated, so amlodipine  was increased to 10 mg daily. She appeared euvolemic despite weight gain. BP was controlled at OV with PCP on 06/19/24. A1C remained stable at 7.5%. At pharmacy call on 07/26/24, we transitioned her from Trulicity  to Mounjaro .  Today, patient reports doing ok. She is tolerating Mounjaro  5 mg well. Notes that she notices a bit more effect on her appetite than she did with Trulicity .   Care Team: Primary Care Provider: Oley Bascom RAMAN, NP ; Next Scheduled Visit: 10/06/23 Cardiologist: Dr. Bensimhon; Next Scheduled Visit: follow-up due June 2026  Medication Access/Adherence  Current Pharmacy:  [facility] [address], [place] - [address] [place] [address] Phone: [phone] Fax: [phone]  [facility] - [facility] [address] [place]  [place] [address] Phone: [phone] Fax: [phone]  [facility] [address] [place] [address] Phone: [phone] Fax: [phone]   Patient reports affordability concerns with their medications: No  Patient reports access/transportation concerns to their pharmacy: No  Patient reports adherence concerns with their medications:  No    Insurance - [employer] Medicaid  Diabetes:  Current medications: Mounjaro  5 mg weekly (Fridays), Farxiga  10 mg daily Medications tried in the past: Ozempic  2 mg (therapeutic failure), Trulicity  (therapeutic failure, unable to titrate past 3 mg weekly due to GI AE)  Denies GI AE currently with Mounjaro  5 mg weekly - stated she had some stomach pain and constipation initially, but this has resolved after staying on the medication longer. Has noticed some more appetite suppression, but no major changes in weight loss yet.  Using glucometer; testing a couple times per week FBG ~ 89, 98. PPG this AM 170 mg/dL  Patient denies hypoglycemic s/sx including dizziness, shakiness, sweating. Patient denies hyperglycemic symptoms including polyuria, polydipsia, polyphagia, nocturia, neuropathy, blurred vision.   Current dietary habits - typically having ~2 meals per day. Reports since switching to Mounjaro , she gets full faster and is not quite as hungry.  - Brunch ~10 AM - cheese omelet with water, sprite - Dinner 5-6PM - salad - just vegetables, no protein. Sometimes has fish or chicken with it.  - Drinks - occasional soda (made goal to stop caffeine/soda intake after 08/28/24), but has been having some fruit punch/tea instead, mostly water. Has been using sugar free flavoring for  water.  Current medication access support: Medicaid [facility])  Hyperlipidemia/ASCVD Risk Reduction  Current lipid lowering medications: Nexlizet  10-180 mg daily Medications tried in the past: atorvastatin  10 mg, rosuvastatin  5-10 mg,  pravastatin - myalgias simvastatin - hives   Heart Failure (EF 45% on cMRI March 2025):  Current medications:  ACEi/ARB/ARNI: Entresto  97-103 mg BID SGLT2i: Farxiga  10 mg daily Beta blocker: carvedilol  3.125 mg BID - noted overdue for refill, patient reports she will request refill from pharmacy Mineralocorticoid Receptor Antagonist: spironolactone  25 mg daily Diuretic regimen: furosemide  80 mg BID -reports that the occasionally misses doses due side effect of frequent urination, but her husband has been encouraging her to take regularly. Has trouble with incontinence when she takes fluid and runs errands/pick up kids Potassium 20 mEq daily Other antihypertensives: amlodipine  10 mg daily  Current home blood pressure readings: does have a blood pressure cuff - this morning 140/89 mmHg, HR 89   Patient reports volume overload signs or symptoms including lower extremity edema when she misses doses of furosemide  or drinks soda. Since cutting out soda, reports that swelling is improved. Was also taking furosemide  more consistently.  Reports mild ShOB with walking - has to take a lot of breaks (uses oxygen  at baseline). Sleeps on 2 pillows overnight.  Current physical activity: walks around the grocery store, but has to take a lot of breaks   Objective:  BMI Readings from Last 3 Encounters:  06/19/24 46.98 kg/m  06/14/24 46.98 kg/m  03/17/24 45.16 kg/m   Wt Readings from Last 3 Encounters:  06/19/24 (!) 309 lb (140.2 kg)  06/14/24 (!) 309 lb (140.2 kg)  03/17/24 297 lb (134.7 kg)   BP Readings from Last 3 Encounters:  06/19/24 125/61  06/14/24 (!) 152/92  03/17/24 138/77   Lab Results  Component Value Date   HGBA1C 7.5 (A) 06/19/2024   HGBA1C 7.5 (A) 03/17/2024   HGBA1C 6.5 (A) 10/20/2023    Lab Results  Component Value Date   CREATININE 0.81 03/17/2024   BUN 22 03/17/2024   NA 142 03/17/2024   K 4.4 03/17/2024   CL 99 03/17/2024   CO2 28 03/17/2024    Lab Results   Component Value Date   CHOL 164 03/17/2024   HDL 55 03/17/2024   LDLCALC 97 03/17/2024   TRIG 62 03/17/2024   CHOLHDL 3.0 03/17/2024    Medications Reviewed Today     Reviewed by Brinda Lorain SQUIBB, RPH-CPP (Pharmacist) on 09/01/24 at 1400  Med List Status: <None>   Medication Order Taking? Sig Documenting Provider Last Dose Status Informant  ACCU-CHEK GUIDE TEST test strip [id number]  USE AS INSTRUCTED TO CHECK SUGAR FOUR TIMES DAILY Nichols, Tonya S, NP  Active   acetaminophen  (TYLENOL ) 500 MG tablet [id number]  Take 500 mg by mouth every 6 (six) hours as needed for mild pain or moderate pain. [provider]  Active Self  amLODipine  (NORVASC ) 10 MG tablet [id number] Yes Take 1 tablet (10 mg total) by mouth daily. Bensimhon, Toribio SAUNDERS, MD  Active   aspirin  81 MG EC tablet [id number]  Take 1 tablet (81 mg total) by mouth daily. Stroud, Natalie M, FNP  Active Self  Bempedoic Acid-Ezetimibe (NEXLIZET ) 180-10 MG TABS [id number] Yes Take 1 tablet by mouth daily. Oley Bascom RAMAN, NP  Active   blood glucose meter kit and supplies KIT [id number]  Dispense based on patient and insurance preference. Use up to four times daily as directed. (FOR ICD-9 250.00, 250.01). Stroud, Natalie M, FNP  Active Self  Blood Glucose Monitoring Suppl (ACCU-CHEK GUIDE) w/Device KIT [id number]  1 each by Does not apply route in the morning, at noon, in the evening, and at bedtime. Oley Bascom RAMAN, NP  Active Self  carvedilol  (COREG ) 3.125 MG tablet [id number] Yes TAKE 1 TABLET BY MOUTH TWICE DAILY WITH MEALS Bensimhon, Toribio SAUNDERS, MD  Active   dapagliflozin  propanediol (FARXIGA ) 10 MG TABS tablet [id number] Yes Take 1 tablet (10 mg total) by mouth daily. [place], Harlene HERO, FNP  Active   Ferrous Sulfate  (IRON ) 325 (65 Fe) MG TABS [id number]  Take 1 tablet by mouth once daily with breakfast Nichols, Tonya S, NP  Active   folic acid  (FOLVITE ) 800 MCG tablet [id number]  Take 0.5 tablets (400 mcg total) by mouth daily. Oley Bascom RAMAN, NP  Active   furosemide  (LASIX ) 80 MG tablet [id number] Yes Take 1 tablet (80 mg total) by mouth 2 (two) times daily. [place], [place], FNP  Active   Lancets (ACCU-CHEK MULTICLIX) lancets [id number]  Use as instructed to check blood sugar 4 times daily. Nichols, Tonya S, NP  Active Self  ofloxacin  (FLOXIN ) 0.3 % OTIC solution [id number]  Place 5 drops into the right ear daily. Oley Bascom RAMAN, NP  Active   potassium chloride  SA (KLOR-CON  M) 20 MEQ tablet [id number] Yes Take 1 tablet (20 mEq total) by mouth daily. Bensimhon, Toribio SAUNDERS, MD  Active   sacubitril -valsartan  (ENTRESTO ) 97-103 MG [id number] Yes Take 1 tablet by mouth twice daily Bensimhon, Daniel R, MD  Active   spironolactone  (ALDACTONE ) 25 MG tablet [id number] Yes Take 1 tablet by mouth once daily Bensimhon, Toribio SAUNDERS, MD  Active     Discontinued 09/01/24 1045 (Dose change)   tirzepatide  (MOUNJARO ) 7.5 MG/0.5ML Pen [id number]  Inject 7.5 mg into the skin once a week. Oley Bascom RAMAN, NP  Active               Assessment/Plan:   Diabetes: - Currently uncontrolled based on last A1C 7.5% above goal < 7%. Patient has successfully transitioned to Mounjaro . Will continue titration with increase to 7.5 mg weekly at next fill in ~3-4 weeks. Encouraged continued lifestyle modifications to control blood sugar, including avoiding sugary beverages. - UACR Sept 2025 - < 5 mg/g - Reviewed long term cardiovascular and renal outcomes of uncontrolled blood sugar - Reviewed goal A1c, goal fasting, and goal 2 hour post prandial glucose - Recommend to continue Mounjaro  5 mg weekly and increase to 7.5 mg weekly after finishing current supply - Recommend to continue Farxiga  10 mg daily  - Patient denies personal or family history of multiple endocrine neoplasia type 2, medullary thyroid  cancer; personal history of pancreatitis or gallbladder disease. - Recommend to check fasting blood glucose daily - Next A1C Dec 2025   Hyperlipidemia/ASCVD  Risk Reduction: - Currently uncontrolled based on last LDL 97 mg/dL above goal < 55 mg/dL given premature ASCVD, but much improved from 165 mg/dL since starting Nexlizet  (~40% decrease). Patient would be a good candidate for PCSK9i combination therapy given hx of premature ASCVD. Will discuss in the future. - Reviewed long term complications of uncontrolled cholesterol - Reviewed dietary recommendations including limiting intake of processed foods - Recommend to continue Nexlizet  (bempedoic acid-ezetimibe) 180-10 mg daily.    Heart Failure (EF 45% in March 2025): - Currently appropriately managed. Patient follows with AHF clinic.  - Reviewed appropriate blood pressure monitoring technique and reviewed goal blood pressure - Reviewed to weigh daily and when to contact  cardiology with weight gain - Recommend to continue current regimen:   ACEi/ARB/ARNI: Entresto  97-103 mg BID SGLT2i: Farxiga  10 mg daily Beta blocker: carvedilol  3.125 mg BID - encouraged patient to request refill today Mineralocorticoid Receptor Antagonist: spironolactone  25 mg daily Diuretic regimen: furosemide  80 mg BID Potassium 20 mEq daily Other antihypertensives: amlodipine  10 mg daily   Follow Up Plan: PharmD telephone 10/13/23, PCP 10/06/23  Lorain Baseman, PharmD [facility] [phone]

## 2024-09-18 ENCOUNTER — Other Ambulatory Visit: Payer: Self-pay

## 2024-09-18 ENCOUNTER — Telehealth: Payer: Self-pay | Admitting: Nurse Practitioner

## 2024-09-18 ENCOUNTER — Ambulatory Visit: Payer: Self-pay | Admitting: Nurse Practitioner

## 2024-09-18 DIAGNOSIS — D649 Anemia, unspecified: Secondary | ICD-10-CM

## 2024-09-18 MED ORDER — IRON 325 (65 FE) MG PO TABS: 1.0000 | ORAL_TABLET | Freq: Every day | ORAL | 0 refills | Status: DC

## 2024-09-18 MED ORDER — ACCU-CHEK GUIDE TEST VI STRP: ORAL_STRIP | 2 refills | Status: DC

## 2024-09-18 NOTE — Telephone Encounter (Signed)
 ACCU-CHEK GUIDE TEST test strip [[id number]]

## 2024-09-18 NOTE — Telephone Encounter (Signed)
 Iron  325 and Accu check test strips sent to pharmacy

## 2024-09-19 ENCOUNTER — Telehealth: Payer: Self-pay | Admitting: Nurse Practitioner

## 2024-09-19 ENCOUNTER — Other Ambulatory Visit: Payer: Self-pay | Admitting: Licensed Clinical Social Worker

## 2024-09-19 NOTE — Telephone Encounter (Signed)
 Copied from CRM [id number]. Topic: Clinical - Medication Question >> Sep 18, 2024  5:00 PM Delon HERO wrote: Reason for CRM: Kayla calling from [facility] pharmacy is calling regarding the sig for glucose blood (ACCU-CHEK GUIDE TEST) test strip [[id number]] needing a corrected sig on how many time that the patient is to take their blood sugars a day.

## 2024-10-03 ENCOUNTER — Telehealth: Payer: Self-pay | Admitting: Nurse Practitioner

## 2024-10-03 ENCOUNTER — Other Ambulatory Visit: Payer: Self-pay

## 2024-10-03 MED ORDER — ACCU-CHEK GUIDE TEST VI STRP: ORAL_STRIP | 2 refills | Status: DC

## 2024-10-03 MED ORDER — ACCU-CHEK GUIDE TEST VI STRP: ORAL_STRIP | 2 refills | Status: AC

## 2024-10-03 NOTE — Telephone Encounter (Signed)
 Copied from CRM #[id number]. Topic: Clinical - Prescription Issue >> Oct 03, 2024  9:24 AM Suzen RAMAN wrote: Reason for CRM: for glucose blood (ACCU-CHEK GUIDE TEST) test strip specific  instruction need to be included; insurance no longer will accept take medication as instruction also needs frequency as well otherwise cant be bill.  fax# [phone]

## 2024-10-03 NOTE — Telephone Encounter (Signed)
 Refill has been sent.

## 2024-10-03 NOTE — Telephone Encounter (Signed)
 glucose blood (ACCU-CHEK GUIDE TEST) test strip [[id number]]

## 2024-10-05 ENCOUNTER — Ambulatory Visit: Payer: Self-pay | Admitting: Nurse Practitioner

## 2024-10-13 ENCOUNTER — Telehealth: Payer: Self-pay

## 2024-10-13 ENCOUNTER — Other Ambulatory Visit: Payer: Self-pay

## 2024-10-13 DIAGNOSIS — E1169 Type 2 diabetes mellitus with other specified complication: Secondary | ICD-10-CM

## 2024-10-13 MED ORDER — TIRZEPATIDE 10 MG/0.5ML ~~LOC~~ SOAJ: 10.0000 mg | SUBCUTANEOUS | 1 refills | Status: AC

## 2024-10-13 NOTE — Progress Notes (Signed)
 Outreached patient for telephone visit today. She requested to reschedule due to headache. Confirmed that she was tolerating Mounjaro  7.5 mg weekly. She states she is and she is interested in increase to 10 mg weekly at next fill. Will send order for Mounjaro  10 mg weekly to continue titration, and reschedule full telephone appt for 3-4 weeks.   Patient expresses understanding. All questions answered.   Lorain Baseman, PharmD [facility] [phone]

## 2024-10-13 NOTE — Progress Notes (Unsigned)
 "  10/13/2024 Name: Claudia [name] MRN: [id number] DOB: 09-14-1966  No chief complaint on file.   Claudia [name] is a 59 y.o. year old female who presented for a telephone visit.   They were referred to the pharmacist by their PCP for assistance in managing diabetes. PMH includes HTN, PSVT, CHF (EF 45% in March 2025) on 3L O3, T2DM, HLD, NSTEMI, BMI > 40.   Subjective: Patient was last seen by PCP on 03/17/24. A1C increased from 6.5% to 7.5%. She continues to have anxiety and depression worsened by current financial and housing situation. Patient LDL-C improved from 165 mg/dL to 97 mg/dL on Nexlizet . She was engaged by pharmacy via telephone on 04/24/24. She reported that she had increased Trulicity  to 1.5 mg weekly, but had not noticed any additional effects. She was instructed to increase Trulicity  to 3 mg at next fill. At pharmacy telephone call on 06/07/24, no changes were made - patient was encouraged to f/u with AHF clinic for weight gain. She saw Dr. Cherrie on 06/14/24. BP was elevated, so amlodipine  was increased to 10 mg daily. She appeared euvolemic despite weight gain. BP was controlled at OV with PCP on 06/19/24. A1C remained stable at 7.5%. At pharmacy call on 07/26/24, we transitioned her from Trulicity  to Mounjaro .  Today, patient reports doing ok. She is tolerating Mounjaro  5 mg well. Notes that she notices a bit more effect on her appetite than she did with Trulicity .   Care Team: Primary Care Provider: Oley Bascom RAMAN, NP ; Next Scheduled Visit: 10/06/23 Cardiologist: Dr. Bensimhon; Next Scheduled Visit: follow-up due June 2026  Medication Access/Adherence  Current Pharmacy:  [facility] [address], [place] - [address] [place] [address] Phone: [phone] Fax: [phone]  [facility] - [facility] [address] [place] [address] Phone: [phone] Fax:  [phone]  [facility] [address] [place] [address] Phone: [phone] Fax: [phone]   Patient reports affordability concerns with their medications: No  Patient reports access/transportation concerns to their pharmacy: No  Patient reports adherence concerns with their medications:  No    Insurance - [employer] Medicaid  Diabetes:  Current medications: Mounjaro  5 mg weekly (Fridays), Farxiga  10 mg daily Medications tried in the past: Ozempic  2 mg (therapeutic failure), Trulicity  (therapeutic failure, unable to titrate past 3 mg weekly due to GI AE)  Denies GI AE currently with Mounjaro  5 mg weekly - stated she had some stomach pain and constipation initially, but this has resolved after staying on the medication longer. Has noticed some more appetite suppression, but no major changes in weight loss yet.  Using glucometer; testing a couple times per week FBG ~ 89, 98. PPG this AM 170 mg/dL  Patient denies hypoglycemic s/sx including dizziness, shakiness, sweating. Patient denies hyperglycemic symptoms including polyuria, polydipsia, polyphagia, nocturia, neuropathy, blurred vision.   Current dietary habits - typically having ~2 meals per day. Reports since switching to Mounjaro , she gets full faster and is not quite as hungry.  - Brunch ~10 AM - cheese omelet with water, sprite - Dinner 5-6PM - salad - just vegetables, no protein. Sometimes has fish or chicken with it.  - Drinks - occasional soda (made goal to stop caffeine/soda intake after 08/28/24), but has been having some fruit punch/tea instead, mostly water. Has been using sugar free flavoring for water.  Current medication access support: Medicaid [facility])  Hyperlipidemia/ASCVD Risk Reduction  Current lipid lowering medications: Nexlizet  10-180 mg daily Medications tried in the past: atorvastatin  10 mg, rosuvastatin  5-10 mg, pravastatin - myalgias simvastatin -  hives   Heart Failure (EF 45% on cMRI March 2025):  Current medications:  ACEi/ARB/ARNI: Entresto  97-103 mg BID SGLT2i: Farxiga  10 mg daily Beta blocker: carvedilol  3.125 mg BID - noted overdue for refill, patient reports she will request refill from pharmacy Mineralocorticoid Receptor Antagonist: spironolactone  25 mg daily Diuretic regimen: furosemide  80 mg BID -reports that the occasionally misses doses due side effect of frequent urination, but her husband has been encouraging her to take regularly. Has trouble with incontinence when she takes fluid and runs errands/pick up kids Potassium 20 mEq daily Other antihypertensives: amlodipine  10 mg daily  Current home blood pressure readings: does have a blood pressure cuff - this morning 140/89 mmHg, HR 89   Patient reports volume overload signs or symptoms including lower extremity edema when she misses doses of furosemide  or drinks soda. Since cutting out soda, reports that swelling is improved. Was also taking furosemide  more consistently.  Reports mild ShOB with walking - has to take a lot of breaks (uses oxygen  at baseline). Sleeps on 2 pillows overnight.  Current physical activity: walks around the grocery store, but has to take a lot of breaks   Objective:  BMI Readings from Last 3 Encounters:  06/19/24 46.98 kg/m  06/14/24 46.98 kg/m  03/17/24 45.16 kg/m   Wt Readings from Last 3 Encounters:  06/19/24 (!) 309 lb (140.2 kg)  06/14/24 (!) 309 lb (140.2 kg)  03/17/24 297 lb (134.7 kg)   BP Readings from Last 3 Encounters:  06/19/24 125/61  06/14/24 (!) 152/92  03/17/24 138/77   Lab Results  Component Value Date   HGBA1C 7.5 (A) 06/19/2024   HGBA1C 7.5 (A) 03/17/2024   HGBA1C 6.5 (A) 10/20/2023    Lab Results  Component Value Date   CREATININE 0.81 03/17/2024   BUN 22 03/17/2024   NA 142 03/17/2024   K 4.4 03/17/2024   CL 99 03/17/2024   CO2 28 03/17/2024    Lab Results  Component Value Date   CHOL 164  03/17/2024   HDL 55 03/17/2024   LDLCALC 97 03/17/2024   TRIG 62 03/17/2024   CHOLHDL 3.0 03/17/2024    Medications Reviewed Today   Medications were not reviewed in this encounter       Assessment/Plan:   Diabetes: - Currently uncontrolled based on last A1C 7.5% above goal < 7%. Patient has successfully transitioned to Mounjaro . Will continue titration with increase to 7.5 mg weekly at next fill in ~3-4 weeks. Encouraged continued lifestyle modifications to control blood sugar, including avoiding sugary beverages. - UACR Sept 2025 - < 5 mg/g - Reviewed long term cardiovascular and renal outcomes of uncontrolled blood sugar - Reviewed goal A1c, goal fasting, and goal 2 hour post prandial glucose - Recommend to continue Mounjaro  5 mg weekly and increase to 7.5 mg weekly after finishing current supply - Recommend to continue Farxiga  10 mg daily  - Patient denies personal or family history of multiple endocrine neoplasia type 2, medullary thyroid  cancer; personal history of pancreatitis or gallbladder disease. - Recommend to check fasting blood glucose daily - Next A1C Dec 2025   Hyperlipidemia/ASCVD Risk Reduction: - Currently uncontrolled based on last LDL 97 mg/dL above goal < 55 mg/dL given premature ASCVD, but much improved from 165 mg/dL since starting Nexlizet  (~40% decrease). Patient would be a good candidate  for PCSK9i combination therapy given hx of premature ASCVD. Will discuss in the future. - Reviewed long term complications of uncontrolled cholesterol - Reviewed dietary recommendations including limiting intake of processed foods - Recommend to continue Nexlizet  (bempedoic acid-ezetimibe) 180-10 mg daily.    Heart Failure (EF 45% in March 2025): - Currently appropriately managed. Patient follows with AHF clinic.  - Reviewed appropriate blood pressure monitoring technique and reviewed goal blood pressure - Reviewed to weigh daily and when to contact cardiology with  weight gain - Recommend to continue current regimen:   ACEi/ARB/ARNI: Entresto  97-103 mg BID SGLT2i: Farxiga  10 mg daily Beta blocker: carvedilol  3.125 mg BID - encouraged patient to request refill today Mineralocorticoid Receptor Antagonist: spironolactone  25 mg daily Diuretic regimen: furosemide  80 mg BID Potassium 20 mEq daily Other antihypertensives: amlodipine  10 mg daily   Follow Up Plan: PharmD telephone 10/13/23, PCP 10/06/23  Lorain Baseman, PharmD [facility] [phone]   "

## 2024-10-16 ENCOUNTER — Other Ambulatory Visit: Payer: Self-pay | Admitting: Nurse Practitioner

## 2024-10-16 DIAGNOSIS — D649 Anemia, unspecified: Secondary | ICD-10-CM

## 2024-10-16 NOTE — Telephone Encounter (Signed)
 Ferrous Sulfate  (IRON ) 325 (65 Fe) MG TABS [Pharmacy Med Name: Iron  325 (65 Fe) MG Oral Tablet]

## 2024-10-17 ENCOUNTER — Other Ambulatory Visit: Payer: Self-pay | Admitting: Nurse Practitioner

## 2024-10-17 ENCOUNTER — Other Ambulatory Visit (HOSPITAL_COMMUNITY): Payer: Self-pay | Admitting: Internal Medicine

## 2024-10-17 DIAGNOSIS — D649 Anemia, unspecified: Secondary | ICD-10-CM

## 2024-10-24 ENCOUNTER — Other Ambulatory Visit: Payer: Self-pay | Admitting: Licensed Clinical Social Worker

## 2024-10-24 NOTE — Patient Instructions (Signed)
 Visit Information  Thank you for taking time to visit with me today. Please don't hesitate to contact me if I can be of assistance to you before our next scheduled appointment.  Our next appointment is by telephone on 11/28/2024 at 1:00 PM   Please call the care guide team at [phone] if you need to cancel or reschedule your appointment.   Following is a copy of your care plan:   Goals Addressed             This Visit's Progress    VBCI Social Work Care Plan   On track    Problems:   Transport needs             Has pet dog for emotional support animal             Housing issues              Financial issues            Anxiety issues; Depression issues            Management of chronic illness issues  CSW Clinical Goal(s):   Over the next 30 days the Patient will attend all scheduled medical appointments as evidenced by patient report and care team review of appointment completion in electronic MEDICAL RECORD NUMBER .             Over next 30 days patient will use coping skills of choice to help her manage anxiety and depression issues faced AEB patient report of reduced issues with anxiety and depression (enjoys caring for pet dog, enjoys listening to music, enjoys games on her phone)  Interventions:  Spoke with client about her managing ADLs and daily needs              Spoke with client about Food Stamps benefit she receives. Her Food Stamps benefit has decreased             Discussed medication procurement.  Discussed support from her spouse            Talked with [place] about her mood . She said that she likes caring for her emotional support pet (dog). She also likes to listen to music to relax. She likes to play games on her phone.              She has difficulty walking. She does have a walker to use if needed.  She also has a cane to use as needed. She gets short of breath occasionally. She has to take rest breaks as needed             Discussed oxygen  use. She said she  uses oxygen  24/7 at 3 liters during the day and uses oxygen  at 4 liters at night.  She uses CPAP machine to help her sleep.                 Discussed financial needs of client . She said she has some financial stressors. She sometimes gets some financial help from her family members               Client said she goes sometimes to [employer] for food support. She also goes to [employer] for resources help.              Client said she does fatigue occasionally. She said she does get short of breath  occasionally.  Provided counseling support               Discussed support of her spouse, (he does laundry, he helps with cooking, home cleaning)               Used Active Listening techniques to hear client needs               Encouraged client to call LCSW as needed for SW support at [phone]               Client was appreciative of call from LCSW today  Patient Goals/Self-Care Activities:  Attend scheduled medical appointments              Take medications as prescribed.               Contact local food pantries as needed. Go to [employer] as able to resources/support.               Communicate with NP Bascom GORMAN Borer as needed to discuss medical needs of client               Call LCSW as needed for SW support at [phone]                  Plan:   Telephone follow up appointment with care management team member scheduled for:  11/28/2024 at 1:00 PM         Please go to [facility] [address] [place] [phone]) if you are experiencing a Mental Health or Behavioral Health Crisis or need someone to talk to.  Patient verbalized understanding of Care plan and visit instructions communicated this visit   Claudia [name]  MSW, LCSW [place]/[facility] Licensed Clinical Social Worker Direct Dial:  [phone] Fax:  [phone] Website:  [email or web address]

## 2024-10-24 NOTE — Patient Outreach (Signed)
 Complex Care Management   Visit Note  10/24/2024  Name:  Claudia [name] MRN: [id number] DOB: [birth year]  Situation: Referral received for Complex Care Management related to client managing medical needs/financial needs I obtained verbal consent from Patient.  Visit completed with Patient  on the phone  Background:   Past Medical History:  Diagnosis Date   Diabetes mellitus    Fibroids    Headache(784.0)    History of echocardiogram    Echo 7/18: Moderate concentric LVH, EF 55-60, normal wall motion, grade 1 diastolic dysfunction, calcified aortic leaflets   Hypercholesterolemia    Hyperlipidemia    Hypertension    Osteoarthritis    , with mild meniscus tear, followed by orthopedics--Dr Heide 04/29/10   Right knee pain    Seizures (HCC)    as child   STEMI (ST elevation myocardial infarction) (HCC)    Vitamin D  deficiency     Assessment: Patient Reported Symptoms:  Cognitive Cognitive Status: Alert and oriented to person, place, and time, Able to follow simple commands Cognitive/Intellectual Conditions Management [RPT]: None reported or documented in medical history or problem list   Health Maintenance Behaviors: Stress management Health Facilitated by: Stress management  Neurological Neurological Review of Symptoms: Weakness, Headaches Neurological Management Strategies: Coping strategies  HEENT HEENT Symptoms Reported: No symptoms reported HEENT Management Strategies: Coping strategies    Cardiovascular Cardiovascular Symptoms Reported: Fatigue Cardiovascular Management Strategies: Coping strategies  Respiratory Respiratory Symptoms Reported: Shortness of breath Other Respiratory Symptoms: SOB. Uses oxygen  as prescribed Respiratory Management Strategies: Coping strategies  Endocrine Endocrine Symptoms Reported: Weakness or fatigue, Shakiness, Shortness of breath    Gastrointestinal Gastrointestinal Symptoms Reported: No symptoms reported Gastrointestinal Management  Strategies: Coping strategies    Genitourinary Genitourinary Symptoms Reported: Frequency, Urgency Additional Genitourinary Details: takes lasix  as prescribed Genitourinary Management Strategies: Adequate rest, Coping strategies  Integumentary Integumentary Symptoms Reported: No symptoms reported Skin Management Strategies: Coping strategies  Musculoskeletal Musculoskelatal Symptoms Reviewed: Muscle pain, Difficulty walking, Weakness Musculoskeletal Management Strategies: Coping strategies      Psychosocial Psychosocial Symptoms Reported: Sadness - if selected complete PHQ 2-9, Anxiety - if selected complete GAD Behavioral Management Strategies: Coping strategies   Quality of Family Relationships: supportive Do you feel physically threatened by others?: No    10/24/2024    PHQ2-9 Depression Screening   Little interest or pleasure in doing things Several days  Feeling down, depressed, or hopeless Several days  PHQ-2 - Total Score 2  Trouble falling or staying asleep, or sleeping too much Several days  Feeling tired or having little energy Several days  Poor appetite or overeating  Several days  Feeling bad about yourself - or that you are a failure or have let yourself or your family down Several days  Trouble concentrating on things, such as reading the newspaper or watching television Several days  Moving or speaking so slowly that other people could have noticed.  Or the opposite - being so fidgety or restless that you have been moving around a lot more than usual Several days  Thoughts that you would be better off dead, or hurting yourself in some way Not at all  PHQ2-9 Total Score 8  If you checked off any problems, how difficult have these problems made it for you to do your work, take care of things at home, or get along with other people Somewhat difficult  Depression Interventions/Treatment Counseling    Today's Vitals  BP may fluctuate per client information  Pain Scale:  0-10  Pain Score: 6  Pain Type: Chronic pain Pain Location: Knee Pain Descriptors / Indicators: Aching Patients Stated Pain Goal: 1 Pain Intervention(s): Relaxation  Medications Reviewed Today   Medications were not reviewed in this encounter     Recommendation:   PCP Follow-up Continue Current Plan of Care Contact [employer] as needed for food support. Go to [employer] as needed for resources support Call LCSW as needed for SW support  Follow Up Plan:   Telephone follow up appointment date/time:  11/28/2024 at 1:00 PM    Glendia Pear  MSW, LCSW [place]/Value Based Care [facility] Licensed Clinical Social Worker Direct Dial:  [phone] Fax:  [phone] Website:  [email or web address]

## 2024-11-08 ENCOUNTER — Ambulatory Visit: Admitting: Nurse Practitioner

## 2024-11-17 ENCOUNTER — Other Ambulatory Visit: Payer: Self-pay

## 2024-11-28 ENCOUNTER — Telehealth: Admitting: Licensed Clinical Social Worker
